# Patient Record
Sex: Male | Born: 1950
Health system: Southern US, Community
[De-identification: ages and names within clinical notes are randomized; demographics above are authoritative.]

## PROBLEM LIST (undated history)

## (undated) DIAGNOSIS — E119 Type 2 diabetes mellitus without complications: Secondary | ICD-10-CM

## (undated) DIAGNOSIS — G4700 Insomnia, unspecified: Secondary | ICD-10-CM

## (undated) DIAGNOSIS — N189 Chronic kidney disease, unspecified: Secondary | ICD-10-CM

## (undated) DIAGNOSIS — C61 Malignant neoplasm of prostate: Secondary | ICD-10-CM

## (undated) DIAGNOSIS — I10 Essential (primary) hypertension: Secondary | ICD-10-CM

## (undated) DIAGNOSIS — E785 Hyperlipidemia, unspecified: Secondary | ICD-10-CM

## (undated) HISTORY — DX: Essential (primary) hypertension: I10

## (undated) HISTORY — PX: PROSTATE BIOPSY: SHX241

## (undated) HISTORY — DX: Insomnia, unspecified: G47.00

## (undated) HISTORY — DX: Chronic kidney disease, unspecified: N18.9

## (undated) HISTORY — DX: Type 2 diabetes mellitus without complications: E11.9

## (undated) HISTORY — DX: Hyperlipidemia, unspecified: E78.5

---

## 1998-05-02 ENCOUNTER — Emergency Department (HOSPITAL_COMMUNITY): Admission: EM | Admit: 1998-05-02 | Discharge: 1998-05-02 | Payer: Self-pay | Admitting: Emergency Medicine

## 2003-07-27 ENCOUNTER — Emergency Department (HOSPITAL_COMMUNITY): Admission: EM | Admit: 2003-07-27 | Discharge: 2003-07-27 | Payer: Self-pay | Admitting: Emergency Medicine

## 2003-07-27 ENCOUNTER — Emergency Department (HOSPITAL_COMMUNITY): Admission: EM | Admit: 2003-07-27 | Discharge: 2003-07-27 | Payer: Self-pay | Admitting: *Deleted

## 2003-07-29 ENCOUNTER — Encounter: Payer: Self-pay | Admitting: Emergency Medicine

## 2003-07-30 ENCOUNTER — Inpatient Hospital Stay (HOSPITAL_COMMUNITY): Admission: EM | Admit: 2003-07-30 | Discharge: 2003-08-02 | Payer: Self-pay | Admitting: Emergency Medicine

## 2003-08-01 ENCOUNTER — Encounter: Payer: Self-pay | Admitting: Orthopedic Surgery

## 2012-11-01 ENCOUNTER — Encounter (HOSPITAL_COMMUNITY): Payer: Self-pay

## 2012-11-01 ENCOUNTER — Emergency Department (INDEPENDENT_AMBULATORY_CARE_PROVIDER_SITE_OTHER)
Admission: EM | Admit: 2012-11-01 | Discharge: 2012-11-01 | Disposition: A | Discharge status: Home / Self Care | Payer: Self-pay | Source: Home / Self Care | Attending: Family Medicine | Admitting: Family Medicine

## 2012-11-01 DIAGNOSIS — M549 Dorsalgia, unspecified: Secondary | ICD-10-CM

## 2012-11-01 MED ORDER — TRAMADOL HCL 50 MG PO TABS: 50.0000 mg | ORAL_TABLET | Freq: Four times a day (QID) | ORAL | Status: DC | PRN

## 2012-11-01 MED ORDER — HYDROCODONE-ACETAMINOPHEN 5-325 MG PO TABS
ORAL_TABLET | ORAL | Status: AC
  Filled 2012-11-01: qty 1

## 2012-11-01 MED ORDER — KETOROLAC TROMETHAMINE 30 MG/ML IJ SOLN
INTRAMUSCULAR | Status: AC
  Filled 2012-11-01: qty 1

## 2012-11-01 MED ORDER — NAPROXEN 500 MG PO TABS: 500.0000 mg | ORAL_TABLET | Freq: Two times a day (BID) | ORAL | Status: DC

## 2012-11-01 MED ORDER — CYCLOBENZAPRINE HCL 10 MG PO TABS: 10.0000 mg | ORAL_TABLET | Freq: Two times a day (BID) | ORAL | Status: DC | PRN

## 2012-11-01 MED ORDER — HYDROCODONE-ACETAMINOPHEN 5-325 MG PO TABS
1.0000 | ORAL_TABLET | Freq: Once | ORAL | Status: AC
  Administered 2012-11-01: 1 via ORAL

## 2012-11-01 MED ORDER — METHYLPREDNISOLONE 4 MG PO KIT: PACK | ORAL | Status: DC

## 2012-11-01 MED ORDER — KETOROLAC TROMETHAMINE 60 MG/2ML IM SOLN
60.0000 mg | Freq: Once | INTRAMUSCULAR | Status: AC
  Administered 2012-11-01: 60 mg via INTRAMUSCULAR

## 2012-11-01 NOTE — ED Notes (Signed)
Pain i right lower back for 1 week; no relief w OTC medications

## 2012-11-01 NOTE — ED Provider Notes (Signed)
History     CSN: [id number]  Arrival date & time 11/01/12  1845   First MD Initiated Contact with Patient 11/01/12 2042      Chief Complaint  Patient presents with  . Back Pain    (Consider location/radiation/quality/duration/timing/severity/associated sxs/prior treatment) Patient is a 62 y.o. male presenting with back pain. The history is provided by the patient.  Back Pain  This is a new problem. The current episode started more than 2 days ago. The problem has been gradually worsening. The pain is associated with no known injury. The pain is present in the lumbar spine (right  side). The pain does not radiate. The pain is moderate. The symptoms are aggravated by bending, twisting and certain positions. The pain is the same all the time. Pertinent negatives include no numbness, no bowel incontinence, no perianal numbness, no bladder incontinence, no dysuria, no paresthesias and no tingling. Treatments tried: hydrocodone. The treatment provided mild relief.  pt reports he works in [employer] daily.  History reviewed. No pertinent past medical history.  History reviewed. No pertinent past surgical history.  History reviewed. No pertinent family history.  History  Substance Use Topics  . Smoking status: Not on file  . Smokeless tobacco: Not on file  . Alcohol Use: Not on file      Review of Systems  Gastrointestinal: Negative for bowel incontinence.  Genitourinary: Negative for bladder incontinence and dysuria.  Musculoskeletal: Positive for back pain.  Neurological: Negative for tingling, numbness and paresthesias.  All other systems reviewed and are negative.    Allergies  Review of patient's allergies indicates not on file.  Home Medications   Current Outpatient Rx  Name  Route  Sig  Dispense  Refill  . CYCLOBENZAPRINE HCL 10 MG PO TABS   Oral   Take 1 tablet (10 mg total) by mouth 2 (two) times daily as needed for muscle spasms.   20  tablet   0   . METHYLPREDNISOLONE 4 MG PO KIT      follow package directions   21 tablet   0   . NAPROXEN 500 MG PO TABS   Oral   Take 1 tablet (500 mg total) by mouth 2 (two) times daily.   30 tablet   0   . TRAMADOL HCL 50 MG PO TABS   Oral   Take 1 tablet (50 mg total) by mouth every 6 (six) hours as needed for pain.   15 tablet   0     BP 154/86  Pulse 94  Temp 98.2 F (36.8 C) (Oral)  Resp 18  SpO2 99%  Physical Exam  Nursing note and vitals reviewed. Constitutional: He is oriented to person, place, and time. Vital signs are normal. He appears well-developed and well-nourished. He is active and cooperative.  HENT:  Head: Normocephalic.  Eyes: Conjunctivae normal are normal. Pupils are equal, round, and reactive to light. No scleral icterus.  Neck: Trachea normal. Neck supple.  Cardiovascular: Normal rate and regular rhythm.   Pulmonary/Chest: Effort normal and breath sounds normal.  Musculoskeletal: Normal range of motion.       Cervical back: Normal.       Thoracic back: Normal.       Lumbar back: He exhibits tenderness. He exhibits normal range of motion, no bony tenderness, no swelling, no edema, no deformity, no laceration, no pain and no spasm.       Back:       Right lower  back tenderness with palpation  Neurological: He is alert and oriented to person, place, and time. He has normal strength and normal reflexes. No cranial nerve deficit or sensory deficit. Coordination and gait normal. GCS eye subscore is 4. GCS verbal subscore is 5. GCS motor subscore is 6.  Skin: Skin is warm and dry.  Psychiatric: He has a normal mood and affect. His speech is normal and behavior is normal. Judgment and thought content normal. Cognition and memory are normal.    ED Course  Procedures (including critical care time)  Labs Reviewed - No data to display No results found.   1. Back pain       MDM  Toradol 60mg  IM and Vicodin administered in office.  Reports  mild improvement at discharge, heat therapy suggested, medications as prescribed, follow up with PCP if symptoms are not improved.          Johnsie Kindred, NP 11/02/12 1619

## 2012-11-02 NOTE — ED Provider Notes (Signed)
Medical screening examination/treatment/procedure(s) were performed by resident physician or non-physician practitioner and as supervising physician I was immediately available for consultation/collaboration.   Arnez Stoneking DOUGLAS MD.    Keland Peyton D Yovan Leeman, MD 11/02/12 1755 

## 2013-07-07 ENCOUNTER — Emergency Department (HOSPITAL_COMMUNITY)
Admission: EM | Admit: 2013-07-07 | Discharge: 2013-07-07 | Discharge status: Against Medical Advice | Payer: Self-pay | Attending: Emergency Medicine | Admitting: Emergency Medicine

## 2013-07-07 ENCOUNTER — Encounter (HOSPITAL_COMMUNITY): Payer: Self-pay | Admitting: Emergency Medicine

## 2013-07-07 DIAGNOSIS — W278XXA Contact with other nonpowered hand tool, initial encounter: Secondary | ICD-10-CM | POA: Insufficient documentation

## 2013-07-07 DIAGNOSIS — S61509A Unspecified open wound of unspecified wrist, initial encounter: Secondary | ICD-10-CM | POA: Insufficient documentation

## 2013-07-07 DIAGNOSIS — Y929 Unspecified place or not applicable: Secondary | ICD-10-CM | POA: Insufficient documentation

## 2013-07-07 DIAGNOSIS — Y9389 Activity, other specified: Secondary | ICD-10-CM | POA: Insufficient documentation

## 2013-07-07 NOTE — ED Notes (Signed)
Pt to window stating he is not going to wait any longer; has removed bandage and no active bleeding noted; informed pt on need to wait to see doctor; pt states he is leaving

## 2013-07-07 NOTE — ED Notes (Signed)
Pt reports he accidentally cut his wrist with a box cutter, bleeding controlled with dressing in triage.

## 2015-06-24 ENCOUNTER — Ambulatory Visit (INDEPENDENT_AMBULATORY_CARE_PROVIDER_SITE_OTHER): Payer: Self-pay | Admitting: Emergency Medicine

## 2015-06-24 VITALS — BP 138/72 | HR 90 | Temp 97.7°F | Resp 18 | Ht 67.0 in | Wt 142.0 lb

## 2015-06-24 DIAGNOSIS — D234 Other benign neoplasm of skin of scalp and neck: Secondary | ICD-10-CM

## 2015-06-24 MED ORDER — DOXYCYCLINE HYCLATE 100 MG PO TABS: 100.0000 mg | ORAL_TABLET | Freq: Two times a day (BID) | ORAL | Status: DC

## 2015-06-24 NOTE — Progress Notes (Signed)
    This chart was scribed for Arlyss Queen, MD by Leandra Kern, Medical Scribe. This patient was seen in Room 11 and the patient's care was started at 8:23 AM.   Chief Complaint:  Chief Complaint  Patient presents with  . Insect Bite    states bee stings scalp 1 week ago    HPI: [name] is a 64 y.o. male who reports to [facility] today complaining of a bee sting on his scalp, onset one week ago.  Pt notes that he did not feel a sting, however when he looked up and saw a nest over his head. Pt indicates that his wife later noticed a knot on the back of his head.    No past medical history on file. No past surgical history on file. Social History   Social History  . Marital Status: Single    Spouse Name: N/A  . Number of Children: N/A  . Years of Education: N/A   Social History Main Topics  . Smoking status: Never Smoker   . Smokeless tobacco: Never Used  . Alcohol Use: No  . Drug Use: No  . Sexual Activity: Not Asked   Other Topics Concern  . None   Social History Narrative   No family history on file. No Known Allergies Prior to Admission medications   Not on File     ROS: The patient denies fevers, chills, night sweats, unintentional weight loss, chest pain, palpitations, wheezing, dyspnea on exertion, nausea, vomiting, abdominal pain, dysuria, hematuria, melena, numbness, weakness, or tingling.   All other systems have been reviewed and were otherwise negative with the exception of those mentioned in the HPI and as above.    PHYSICAL EXAM: Filed Vitals:   06/24/15 0820  BP: 138/72  Pulse: 90  Temp: 97.7 F (36.5 C)  Resp: 18   Body mass index is 22.24 kg/(m^2).   General: Alert, no acute distress HEENT:  Normocephalic, atraumatic, oropharynx patent. Eye: [facility] Cardiovascular:  Regular rate and rhythm, no rubs murmurs or gallops.  No Carotid bruits, radial pulse intact. No pedal edema.  Respiratory: Clear to auscultation bilaterally.  No  wheezes, rales, or rhonchi.  No cyanosis, no use of accessory musculature Abdominal: No organomegaly, abdomen is soft and non-tender, positive bowel sounds.  No masses. Musculoskeletal: Gait intact. No edema, tenderness Skin: No rashes. 1 X 1.5 cm circular cystic nodule mid portion of the scalp. There are some purulent on the center.  Neurologic: Facial musculature symmetric. Psychiatric: Patient acts appropriately throughout our interaction. Lymphatic: No cervical or submandibular lymphadenopathy Genitourinary/Anorectal: No acute findings    LABS: No results found for this or any previous visit.   EKG/XRAY:   Primary read interpreted by Dr. Everlene Farrier at [facility].   ASSESSMENT/PLAN: Patient has what appears to be an infected dermoid cyst of his scalp. Will treat with doxycycline twice a day soap and water cleaning recheck 1 week if cyst persists.I personally performed the services described in this documentation, which was scribed in my presence. The recorded information has been reviewed and is accurate.    Gross sideeffects, risk and benefits, and alternatives of medications d/w patient. Patient is aware that all medications have potential sideeffects and we are unable to predict every sideeffect or drug-drug interaction that may occur.  Arlyss Queen MD 06/24/2015 8:23 AM

## 2015-06-24 NOTE — Patient Instructions (Signed)
Please clean the area with soap and water twice a day. Do gentle massage to the area twice a day. Recheck one week if cyst has not resolved

## 2015-06-26 LAB — WOUND CULTURE
Gram Stain: NONE SEEN
Gram Stain: NONE SEEN

## 2016-01-22 ENCOUNTER — Ambulatory Visit (INDEPENDENT_AMBULATORY_CARE_PROVIDER_SITE_OTHER): Payer: Self-pay

## 2016-01-22 ENCOUNTER — Ambulatory Visit (INDEPENDENT_AMBULATORY_CARE_PROVIDER_SITE_OTHER): Payer: Self-pay | Admitting: Emergency Medicine

## 2016-01-22 VITALS — BP 116/60 | HR 97 | Temp 97.6°F | Resp 16 | Ht 67.0 in | Wt 133.0 lb

## 2016-01-22 DIAGNOSIS — F32A Depression, unspecified: Secondary | ICD-10-CM

## 2016-01-22 DIAGNOSIS — Z1211 Encounter for screening for malignant neoplasm of colon: Secondary | ICD-10-CM

## 2016-01-22 DIAGNOSIS — R634 Abnormal weight loss: Secondary | ICD-10-CM

## 2016-01-22 DIAGNOSIS — Z0000 Encounter for general adult medical examination without abnormal findings: Secondary | ICD-10-CM

## 2016-01-22 DIAGNOSIS — R739 Hyperglycemia, unspecified: Secondary | ICD-10-CM

## 2016-01-22 DIAGNOSIS — G4700 Insomnia, unspecified: Secondary | ICD-10-CM

## 2016-01-22 DIAGNOSIS — Z113 Encounter for screening for infections with a predominantly sexual mode of transmission: Secondary | ICD-10-CM

## 2016-01-22 DIAGNOSIS — D234 Other benign neoplasm of skin of scalp and neck: Secondary | ICD-10-CM

## 2016-01-22 DIAGNOSIS — F329 Major depressive disorder, single episode, unspecified: Secondary | ICD-10-CM

## 2016-01-22 DIAGNOSIS — R369 Urethral discharge, unspecified: Secondary | ICD-10-CM

## 2016-01-22 LAB — POCT CBC
Granulocyte percent: 59 %G (ref 37–80)
HCT, POC: 42.8 % — AB (ref 43.5–53.7)
Hemoglobin: 15.3 g/dL (ref 14.1–18.1)
Lymph, poc: 1.1 (ref 0.6–3.4)
MCH, POC: 32.8 pg — AB (ref 27–31.2)
MCHC: 35.7 g/dL — AB (ref 31.8–35.4)
MCV: 91.9 fL (ref 80–97)
MID (cbc): 0.4 (ref 0–0.9)
MPV: 7.4 fL (ref 0–99.8)
POC Granulocyte: 2.2 (ref 2–6.9)
POC LYMPH PERCENT: 30.6 %L (ref 10–50)
POC MID %: 10.4 %M (ref 0–12)
Platelet Count, POC: 283 10*3/uL (ref 142–424)
RBC: 4.66 M/uL — AB (ref 4.69–6.13)
RDW, POC: 12.1 %
WBC: 3.7 10*3/uL — AB (ref 4.6–10.2)

## 2016-01-22 MED ORDER — ESCITALOPRAM OXALATE 5 MG PO TABS: ORAL_TABLET | ORAL | Status: DC

## 2016-01-22 MED ORDER — DOXYCYCLINE HYCLATE 100 MG PO TABS: 100.0000 mg | ORAL_TABLET | Freq: Two times a day (BID) | ORAL | Status: DC

## 2016-01-22 MED ORDER — CEFTRIAXONE SODIUM 250 MG IJ SOLR
250.0000 mg | Freq: Once | INTRAMUSCULAR | Status: AC
  Administered 2016-01-22: 250 mg via INTRAMUSCULAR

## 2016-01-22 NOTE — Progress Notes (Addendum)
Patient ID: [name], male   DOB: 07-06-1951, 65 y.o.   MRN: [id number]    By signing my name below, I, Essence Howell, attest that this documentation has been prepared under the direction and in the presence of Darlyne Russian, MD Electronically Signed: Ladene Artist, ED Scribe 01/22/2016 at 12:04 PM .  Chief Complaint:  Chief Complaint  Patient presents with  . Annual Exam   HPI: [name] is a 65 y.o. male who reports to [facility] today for an annual exam. Pt denies previous surgeries. No h/o alcohol, tobacco or illicit drug use. Pt denies h/o DM or CA. His brother has a h/o DM and his mother has a h/o colon CA; passed 2 months ago under hospice. Pt states that he has not eaten or slept much since she passed. He reports mild depressed mood from this event but does not desire to speak with a hospice counselor at this time. Pt has tried Aleve PM to assist with sleep but states that it was too strong and he woke up "swimmy headed." He further reports that he has lost weight recently and often wakes up in the mornings with dizziness. No other treatments tried PTA.  Penile Discharge  Pt reports persistent penile discharge for the past 6 months. No treatments tried PTA.   Pt works in home improvement.    Wt Readings from Last 3 Encounters:  01/22/16 133 lb (60.328 kg)  06/24/15 142 lb (64.411 kg)   History reviewed. No pertinent past medical history. History reviewed. No pertinent past surgical history. Social History   Social History  . Marital Status: Single    Spouse Name: N/A  . Number of Children: N/A  . Years of Education: N/A   Social History Main Topics  . Smoking status: Never Smoker   . Smokeless tobacco: Never Used  . Alcohol Use: No  . Drug Use: No  . Sexual Activity: Not Asked   Other Topics Concern  . None   Social History Narrative   History reviewed. No pertinent family history. No Known Allergies Prior to Admission medications   Medication Sig Start  Date End Date Taking? Authorizing Provider  doxycycline (VIBRA-TABS) 100 MG tablet Take 1 tablet (100 mg total) by mouth 2 (two) times daily. Patient not taking: Reported on 01/22/2016 06/24/15   Darlyne Russian, MD   ROS: The patient denies fevers, chills, night sweats, chest pain, palpitations, wheezing, dyspnea on exertion, nausea, vomiting, abdominal pain, dysuria, hematuria, melena, numbness, weakness, or tingling. +weight change, +penile discharge, +dizziness, +sleep disturbances   All other systems have been reviewed and were otherwise negative with the exception of those mentioned in the HPI and as above.    PHYSICAL EXAM: Filed Vitals:   01/22/16 1039  BP: 116/60  Pulse: 97  Temp: 97.6 F (36.4 C)  Resp: 16   Body mass index is 20.83 kg/(m^2).  General: Alert, no acute distress HEENT:  Normocephalic, atraumatic, oropharynx patent. Poor dentition.  Eye: [facility] Cardiovascular: Regular rate and rhythm, no rubs murmurs or gallops. No Carotid bruits, radial pulse intact. No pedal edema.  Respiratory: Clear to auscultation bilaterally. No wheezes, rales, or rhonchi. No cyanosis, no use of accessory musculature Abdominal: No organomegaly, abdomen is soft and non-tender, positive bowel sounds. No masses. Musculoskeletal: Gait intact. No edema, tenderness GU: Uncircumcised. Yellow thick purulent discharge. Normal prostate. Normal rectal exam.  Skin: No rashes. Neurologic: Facial musculature symmetric. Psychiatric: Patient acts appropriately throughout our interaction. Lymphatic: No cervical or  submandibular lymphadenopathy  LABS: Results for orders placed or performed in visit on 01/22/16  POCT CBC  Result Value Ref Range   WBC 3.7 (A) 4.6 - 10.2 K/uL   Lymph, poc 1.1 0.6 - 3.4   POC LYMPH PERCENT 30.6 10 - 50 %L   MID (cbc) 0.4 0 - 0.9   POC MID % 10.4 0 - 12 %M   POC Granulocyte 2.2 2 - 6.9   Granulocyte percent 59.0 37 - 80 %G   RBC 4.66 (A) 4.69 - 6.13 M/uL    Hemoglobin 15.3 14.1 - 18.1 g/dL   HCT, POC 42.8 (A) 43.5 - 53.7 %   MCV 91.9 80 - 97 fL   MCH, POC 32.8 (A) 27 - 31.2 pg   MCHC 35.7 (A) 31.8 - 35.4 g/dL   RDW, POC 12.1 %   Platelet Count, POC 283 142 - 424 K/uL   MPV 7.4 0 - 99.8 fL   EKG/XRAY:   Primary read interpreted by Dr. Everlene Farrier at [facility]. EKG was normal sinus rhythm Dg Chest 2 View  01/22/2016  CLINICAL DATA:  Weight loss EXAM: CHEST  2 VIEW COMPARISON:  None in PACs FINDINGS: The lungs are well-expanded and clear. There is a 2 mm diameter calcified nodule that projects approximately 1.5 cm above the dome of the left hemidiaphragm on the frontal view. The heart and pulmonary vascularity are normal. The mediastinum is normal in width. There is no pleural effusion or pneumothorax. The bony thorax exhibits no acute abnormality. IMPRESSION: There is no active cardiopulmonary disease. Electronically Signed   By: David  Martinique M.D.   On: 01/22/2016 12:42      ASSESSMENT/PLAN: Patient seemed depressed to me. He lost his mother 2 months ago to cancer. She died of colon cancer and he has never had colon cancer screening. We'll try Lexapro 5 mg at night to see if this helps with depression and with his problems with insomnia. His routine labs were done today including STD testing. On exam he had a. Urethral discharge which was sent for gonorrhea and chlamydia. Glucose returned almost 400. Have started Glucophage 500 one a day for the first week then 2 daily. Recheck 1 week.    Gross sideeffects, risk and benefits, and alternatives of medications d/w patient. Patient is aware that all medications have potential sideeffects and we are unable to predict every sideeffect or drug-drug interaction that may occur.  Arlyss Queen MD 01/22/2016 10:53 AM

## 2016-01-22 NOTE — Patient Instructions (Addendum)
   IF you received an x-ray today, you will receive an invoice from [facility]. Please contact [facility] at [phone] with questions or concerns regarding your invoice.   IF you received labwork today, you will receive an invoice from [facility]. Please contact [facility] at [phone] with questions or concerns regarding your invoice.   Our billing staff will not be able to assist you with questions regarding bills from these companies.  You will be contacted with the lab results as soon as they are available. The fastest way to get your results is to activate your My Chart account. Instructions are located on the last page of this paperwork. If you have not heard from us regarding the results in 2 weeks, please contact this office.     Health Maintenance, Male A healthy lifestyle and preventative care can promote health and wellness.  Maintain regular health, dental, and eye exams.  Eat a healthy diet. Foods like vegetables, fruits, whole grains, low-fat dairy products, and lean protein foods contain the nutrients you need and are low in calories. Decrease your intake of foods high in solid fats, added sugars, and salt. Get information about a proper diet from your health care provider, if necessary.  Regular physical exercise is one of the most important things you can do for your health. Most adults should get at least 150 minutes of moderate-intensity exercise (any activity that increases your heart rate and causes you to sweat) each week. In addition, most adults need muscle-strengthening exercises on 2 or more days a week.   Maintain a healthy weight. The body mass index (BMI) is a screening tool to identify possible weight problems. It provides an estimate of body fat based on height and weight. Your health care provider can find your BMI and can help you achieve or maintain a healthy weight. For males 20 years and older:  A BMI  below 18.5 is considered underweight.  A BMI of 18.5 to 24.9 is normal.  A BMI of 25 to 29.9 is considered overweight.  A BMI of 30 and above is considered obese.  Maintain normal blood lipids and cholesterol by exercising and minimizing your intake of saturated fat. Eat a balanced diet with plenty of fruits and vegetables. Blood tests for lipids and cholesterol should begin at age 20 and be repeated every 5 years. If your lipid or cholesterol levels are high, you are over age 50, or you are at high risk for heart disease, you may need your cholesterol levels checked more frequently.Ongoing high lipid and cholesterol levels should be treated with medicines if diet and exercise are not working.  If you smoke, find out from your health care provider how to quit. If you do not use tobacco, do not start.  Lung cancer screening is recommended for adults aged 55-80 years who are at high risk for developing lung cancer because of a history of smoking. A yearly low-dose CT scan of the lungs is recommended for people who have at least a 30-pack-year history of smoking and are current smokers or have quit within the past 15 years. A pack year of smoking is smoking an average of 1 pack of cigarettes a day for 1 year (for example, a 30-pack-year history of smoking could mean smoking 1 pack a day for 30 years or 2 packs a day for 15 years). Yearly screening should continue until the smoker has stopped smoking for at least 15 years. Yearly screening should be   stopped for people who develop a health problem that would prevent them from having lung cancer treatment.  If you choose to drink alcohol, do not have more than 2 drinks per day. One drink is considered to be 12 oz (360 mL) of beer, 5 oz (150 mL) of wine, or 1.5 oz (45 mL) of liquor.  Avoid the use of street drugs. Do not share needles with anyone. Ask for help if you need support or instructions about stopping the use of drugs.  High blood pressure  causes heart disease and increases the risk of stroke. High blood pressure is more likely to develop in:  People who have blood pressure in the end of the normal range (100-139/85-89 mm Hg).  People who are overweight or obese.  People who are African American.  If you are 59-85 years of age, have your blood pressure checked every 3-5 years. If you are 77 years of age or older, have your blood pressure checked every year. You should have your blood pressure measured twice--once when you are at a hospital or clinic, and once when you are not at a hospital or clinic. Record the average of the two measurements. To check your blood pressure when you are not at a hospital or clinic, you can use:  An automated blood pressure machine at a pharmacy.  A home blood pressure monitor.  If you are 55-40 years old, ask your health care provider if you should take aspirin to prevent heart disease.  Diabetes screening involves taking a blood sample to check your fasting blood sugar level. This should be done once every 3 years after age 82 if you are at a normal weight and without risk factors for diabetes. Testing should be considered at a younger age or be carried out more frequently if you are overweight and have at least 1 risk factor for diabetes.  Colorectal cancer can be detected and often prevented. Most routine colorectal cancer screening begins at the age of 60 and continues through age 35. However, your health care provider may recommend screening at an earlier age if you have risk factors for colon cancer. On a yearly basis, your health care provider may provide home test kits to check for hidden blood in the stool. A small camera at the end of a tube may be used to directly examine the colon (sigmoidoscopy or colonoscopy) to detect the earliest forms of colorectal cancer. Talk to your health care provider about this at age 48 when routine screening begins. A direct exam of the colon should be repeated  every 5-10 years through age 71, unless early forms of precancerous polyps or small growths are found.  People who are at an increased risk for hepatitis B should be screened for this virus. You are considered at high risk for hepatitis B if:  You were born in a country where hepatitis B occurs often. Talk with your health care provider about which countries are considered high risk.  Your parents were born in a high-risk country and you have not received a shot to protect against hepatitis B (hepatitis B vaccine).  You have HIV or AIDS.  You use needles to inject street drugs.  You live with, or have sex with, someone who has hepatitis B.  You are a man who has sex with other men (MSM).  You get hemodialysis treatment.  You take certain medicines for conditions like cancer, organ transplantation, and autoimmune conditions.  Hepatitis C blood testing is recommended  for all people born from [birth year] through 1964 and any individual with known risk factors for hepatitis C.  Healthy men should no longer receive prostate-specific antigen (PSA) blood tests as part of routine cancer screening. Talk to your health care provider about prostate cancer screening.  Testicular cancer screening is not recommended for adolescents or adult males who have no symptoms. Screening includes self-exam, a health care provider exam, and other screening tests. Consult with your health care provider about any symptoms you have or any concerns you have about testicular cancer.  Practice safe sex. Use condoms and avoid high-risk sexual practices to reduce the spread of sexually transmitted infections (STIs).  You should be screened for STIs, including gonorrhea and chlamydia if:  You are sexually active and are younger than 24 years.  You are older than 24 years, and your health care provider tells you that you are at risk for this type of infection.  Your sexual activity has changed since you were last screened,  and you are at an increased risk for chlamydia or gonorrhea. Ask your health care provider if you are at risk.  If you are at risk of being infected with HIV, it is recommended that you take a prescription medicine daily to prevent HIV infection. This is called pre-exposure prophylaxis (PrEP). You are considered at risk if:  You are a man who has sex with other men (MSM).  You are a heterosexual man who is sexually active with multiple partners.  You take drugs by injection.  You are sexually active with a partner who has HIV.  Talk with your health care provider about whether you are at high risk of being infected with HIV. If you choose to begin PrEP, you should first be tested for HIV. You should then be tested every 3 months for as long as you are taking PrEP.  Use sunscreen. Apply sunscreen liberally and repeatedly throughout the day. You should seek shade when your shadow is shorter than you. Protect yourself by wearing long sleeves, pants, a wide-brimmed hat, and sunglasses year round whenever you are outdoors.  Tell your health care provider of new moles or changes in moles, especially if there is a change in shape or color. Also, tell your health care provider if a mole is larger than the size of a pencil eraser.  A one-time screening for abdominal aortic aneurysm (AAA) and surgical repair of large AAAs by ultrasound is recommended for men aged 49-75 years who are current or former smokers.  Stay current with your vaccines (immunizations).   This information is not intended to replace advice given to you by your health care provider. Make sure you discuss any questions you have with your health care provider.   Document Released: 04/10/2008 Document Revised: 11/03/2014 Document Reviewed: 03/10/2011 Elsevier Interactive Patient Education 2016 [employer]. Insomnia Insomnia is a sleep disorder that makes it difficult to fall asleep or to stay asleep. Insomnia can cause tiredness  (fatigue), low energy, difficulty concentrating, mood swings, and poor performance at work or school.  There are three different ways to classify insomnia:  Difficulty falling asleep.  Difficulty staying asleep.  Waking up too early in the morning. Any type of insomnia can be long-term (chronic) or short-term (acute). Both are common. Short-term insomnia usually lasts for three months or less. Chronic insomnia occurs at least three times a week for longer than three months. CAUSES  Insomnia may be caused by another condition, situation, or substance, such as:  Anxiety.  Certain medicines.  Gastroesophageal reflux disease (GERD) or other gastrointestinal conditions.  Asthma or other breathing conditions.  Restless legs syndrome, sleep apnea, or other sleep disorders.  Chronic pain.  Menopause. This may include hot flashes.  Stroke.  Abuse of alcohol, tobacco, or illegal drugs.  Depression.  Caffeine.   Neurological disorders, such as Alzheimer disease.  An overactive thyroid (hyperthyroidism). The cause of insomnia may not be known. RISK FACTORS Risk factors for insomnia include:  Gender. Women are more commonly affected than men.  Age. Insomnia is more common as you get older.  Stress. This may involve your professional or personal life.  Income. Insomnia is more common in people with lower income.  Lack of exercise.   Irregular work schedule or night shifts.  Traveling between different time zones. SIGNS AND SYMPTOMS If you have insomnia, trouble falling asleep or trouble staying asleep is the main symptom. This may lead to other symptoms, such as:  Feeling fatigued.  Feeling nervous about going to sleep.  Not feeling rested in the morning.  Having trouble concentrating.  Feeling irritable, anxious, or depressed. TREATMENT  Treatment for insomnia depends on the cause. If your insomnia is caused by an underlying condition, treatment will focus on  addressing the condition. Treatment may also include:   Medicines to help you sleep.  Counseling or therapy.  Lifestyle adjustments. HOME CARE INSTRUCTIONS   Take medicines only as directed by your health care provider.  Keep regular sleeping and waking hours. Avoid naps.  Keep a sleep diary to help you and your health care provider figure out what could be causing your insomnia. Include:   When you sleep.  When you wake up during the night.  How well you sleep.   How rested you feel the next day.  Any side effects of medicines you are taking.  What you eat and drink.   Make your bedroom a comfortable place where it is easy to fall asleep:  Put up shades or special blackout curtains to block light from outside.  Use a white noise machine to block noise.  Keep the temperature cool.   Exercise regularly as directed by your health care provider. Avoid exercising right before bedtime.  Use relaxation techniques to manage stress. Ask your health care provider to suggest some techniques that may work well for you. These may include:  Breathing exercises.  Routines to release muscle tension.  Visualizing peaceful scenes.  Cut back on alcohol, caffeinated beverages, and cigarettes, especially close to bedtime. These can disrupt your sleep.  Do not overeat or eat spicy foods right before bedtime. This can lead to digestive discomfort that can make it hard for you to sleep.  Limit screen use before bedtime. This includes:  Watching TV.  Using your smartphone, tablet, and computer.  Stick to a routine. This can help you fall asleep faster. Try to do a quiet activity, brush your teeth, and go to bed at the same time each night.  Get out of bed if you are still awake after 15 minutes of trying to sleep. Keep the lights down, but try reading or doing a quiet activity. When you feel sleepy, go back to bed.  Make sure that you drive carefully. Avoid driving if you feel  very sleepy.  Keep all follow-up appointments as directed by your health care provider. This is important. SEEK MEDICAL CARE IF:   You are tired throughout the day or have trouble in your daily routine due  to sleepiness.  You continue to have sleep problems or your sleep problems get worse. SEEK IMMEDIATE MEDICAL CARE IF:   You have serious thoughts about hurting yourself or someone else.   This information is not intended to replace advice given to you by your health care provider. Make sure you discuss any questions you have with your health care provider.   Document Released: 10/10/2000 Document Revised: 07/04/2015 Document Reviewed: 07/14/2014 Elsevier Interactive Patient Education [employer].

## 2016-01-23 ENCOUNTER — Other Ambulatory Visit: Payer: Self-pay | Admitting: Emergency Medicine

## 2016-01-23 DIAGNOSIS — E119 Type 2 diabetes mellitus without complications: Secondary | ICD-10-CM | POA: Insufficient documentation

## 2016-01-23 LAB — COMPLETE METABOLIC PANEL WITH GFR
ALT: 13 U/L (ref 9–46)
AST: 14 U/L (ref 10–35)
Albumin: 4.2 g/dL (ref 3.6–5.1)
Alkaline Phosphatase: 93 U/L (ref 40–115)
BUN: 17 mg/dL (ref 7–25)
CO2: 29 mmol/L (ref 20–31)
Calcium: 10.1 mg/dL (ref 8.6–10.3)
Chloride: 94 mmol/L — ABNORMAL LOW (ref 98–110)
Creat: 1.09 mg/dL (ref 0.70–1.25)
GFR, Est African American: 83 mL/min (ref >=60)
GFR, Est Non African American: 72 mL/min (ref >=60)
Glucose, Bld: 382 mg/dL — ABNORMAL HIGH (ref 65–99)
Potassium: 4.9 mmol/L (ref 3.5–5.3)
Sodium: 137 mmol/L (ref 135–146)
Total Bilirubin: 0.6 mg/dL (ref 0.2–1.2)
Total Protein: 7.3 g/dL (ref 6.1–8.1)

## 2016-01-23 LAB — LIPID PANEL
Cholesterol: 221 mg/dL — ABNORMAL HIGH (ref 125–200)
HDL: 50 mg/dL (ref >=40)
LDL Cholesterol: 148 mg/dL — ABNORMAL HIGH (ref <130)
Total CHOL/HDL Ratio: 4.4 Ratio (ref <=5.0)
Triglycerides: 114 mg/dL (ref <150)
VLDL: 23 mg/dL (ref <30)

## 2016-01-23 LAB — PSA: PSA: 2.69 ng/mL (ref <=4.00)

## 2016-01-23 LAB — HIV ANTIBODY (ROUTINE TESTING W REFLEX): HIV 1&2 Ab, 4th Generation: NONREACTIVE

## 2016-01-23 LAB — GC/CHLAMYDIA PROBE AMP
CT Probe RNA: NOT DETECTED
GC Probe RNA: NOT DETECTED

## 2016-01-23 LAB — RPR

## 2016-01-23 LAB — HEPATITIS C ANTIBODY: HCV Ab: NEGATIVE

## 2016-01-23 MED ORDER — METFORMIN HCL ER 500 MG PO TB24: ORAL_TABLET | ORAL | Status: DC

## 2016-01-23 NOTE — Addendum Note (Signed)
Addended by: Arlyss Queen A on: 01/23/2016 07:50 AM   Modules accepted: Miquel Dunn

## 2016-02-22 ENCOUNTER — Other Ambulatory Visit: Payer: Self-pay

## 2016-02-22 DIAGNOSIS — F32A Depression, unspecified: Secondary | ICD-10-CM

## 2016-02-22 DIAGNOSIS — F329 Major depressive disorder, single episode, unspecified: Secondary | ICD-10-CM

## 2016-02-22 DIAGNOSIS — G4700 Insomnia, unspecified: Secondary | ICD-10-CM

## 2016-02-22 MED ORDER — ESCITALOPRAM OXALATE 5 MG PO TABS: ORAL_TABLET | ORAL | Status: DC

## 2016-03-04 ENCOUNTER — Other Ambulatory Visit: Payer: Self-pay

## 2016-03-04 DIAGNOSIS — G4700 Insomnia, unspecified: Secondary | ICD-10-CM

## 2016-03-04 DIAGNOSIS — F329 Major depressive disorder, single episode, unspecified: Secondary | ICD-10-CM

## 2016-03-04 DIAGNOSIS — F32A Depression, unspecified: Secondary | ICD-10-CM

## 2016-03-04 MED ORDER — ESCITALOPRAM OXALATE 5 MG PO TABS: ORAL_TABLET | ORAL | Status: DC

## 2016-03-04 MED ORDER — METFORMIN HCL ER 500 MG PO TB24: ORAL_TABLET | ORAL | Status: DC

## 2018-07-06 ENCOUNTER — Encounter

## 2018-11-01 ENCOUNTER — Encounter (HOSPITAL_COMMUNITY): Payer: Self-pay

## 2018-11-01 ENCOUNTER — Other Ambulatory Visit: Payer: Self-pay

## 2018-11-01 ENCOUNTER — Ambulatory Visit (HOSPITAL_COMMUNITY)
Admission: EM | Admit: 2018-11-01 | Discharge: 2018-11-01 | Disposition: A | Discharge status: Home / Self Care | Payer: Medicare HMO | Attending: Internal Medicine | Admitting: Internal Medicine

## 2018-11-01 ENCOUNTER — Ambulatory Visit (INDEPENDENT_AMBULATORY_CARE_PROVIDER_SITE_OTHER): Payer: Medicare HMO

## 2018-11-01 DIAGNOSIS — J069 Acute upper respiratory infection, unspecified: Secondary | ICD-10-CM | POA: Diagnosis not present

## 2018-11-01 DIAGNOSIS — R05 Cough: Secondary | ICD-10-CM

## 2018-11-01 DIAGNOSIS — J029 Acute pharyngitis, unspecified: Secondary | ICD-10-CM

## 2018-11-01 DIAGNOSIS — B9789 Other viral agents as the cause of diseases classified elsewhere: Secondary | ICD-10-CM | POA: Diagnosis not present

## 2018-11-01 LAB — POCT RAPID STREP A: Streptococcus, Group A Screen (Direct): NEGATIVE

## 2018-11-01 MED ORDER — ACETAMINOPHEN 325 MG PO TABS
975.0000 mg | ORAL_TABLET | Freq: Once | ORAL | Status: AC
  Administered 2018-11-01: 975 mg via ORAL

## 2018-11-01 MED ORDER — IBUPROFEN 600 MG PO TABS: 600.0000 mg | ORAL_TABLET | Freq: Four times a day (QID) | ORAL | 0 refills | Status: DC | PRN

## 2018-11-01 MED ORDER — ACETAMINOPHEN 325 MG PO TABS
ORAL_TABLET | ORAL | Status: AC
  Filled 2018-11-01: qty 3

## 2018-11-01 MED ORDER — GUAIFENESIN-CODEINE 100-10 MG/5ML PO SOLN: 5.0000 mL | Freq: Three times a day (TID) | ORAL | 0 refills | Status: DC | PRN

## 2018-11-01 NOTE — ED Triage Notes (Signed)
Pt cc coughing and sore throat x  3 days.

## 2018-11-01 NOTE — Discharge Instructions (Addendum)
Strep test negative Chest Xray negative for pneumonia Likely viral illness causing symptoms and fever Please use robitussin with codeine to help with cough and sore thorat- will cause drowsiness, do not drive or work after taking  Sore Throat  Your rapid strep tested Negative today. We will send for a culture and call in about 2 days if results are positive. For now we will treat your sore throat as a virus with symptom management.   Please continue Tylenol or Ibuprofen for fever and pain. May try salt water gargles, cepacol lozenges, throat spray, or OTC cold relief medicine for throat discomfort. If you also have congestion take a daily anti-histamine like Zyrtec, Claritin, and a oral decongestant to help with post nasal drip that may be irritating your throat.   Stay hydrated and drink plenty of fluids to keep your throat coated relieve irritation.

## 2018-11-01 NOTE — ED Provider Notes (Addendum)
[facility]    CSN: [id number] Arrival date & time: 11/01/18  2706     History   Chief Complaint Chief Complaint  Patient presents with  . Cough    HPI [name] is a 68 y.o. male history of diabetes, depression presenting today for evaluation of cough, sore throat and fever.  Patient states that for the past 3 days he has had hot and cold chills, felt warm and noted a persistent cough.  The cough is led to a sore throat.  Now his sore throat is his main complaint.  Decreased oral intake due to discomfort with swallowing.  Has had minimal nasal congestion.  He has tried Scientist, clinical (histocompatibility and immunogenetics).  Has not taken any antipyretics.  Denies nausea, vomiting or diarrhea.  Symptoms began after scraping of popcorn ceiling. Denies chest pain or shortness of breath.  HPI  History reviewed. No pertinent past medical history.  Patient Active Problem List   Diagnosis Date Noted  . Diabetes ([place]) 01/23/2016  . Depression (em[place]) 01/22/2016    History reviewed. No pertinent surgical history.     Home Medications    Prior to Admission medications   Medication Sig Start Date End Date Taking? Authorizing Provider  doxycycline (VIBRA-TABS) 100 MG tablet Take 1 tablet (100 mg total) by mouth 2 (two) times daily. 01/22/16   Darlyne Russian, MD  escitalopram (LEXAPRO) 5 MG tablet Take 1 tablet at night for sleep after 2 weeks if still not sleeping well may take 2 tablets. 03/04/16   Darlyne Russian, MD  guaiFENesin-codeine 100-10 MG/5ML syrup Take 5 mLs by mouth 3 (three) times daily as needed for cough. Will cause drowsiness 11/01/18   Fatin Bachicha C, PA-C  ibuprofen (ADVIL,MOTRIN) 600 MG tablet Take 1 tablet (600 mg total) by mouth every 6 (six) hours as needed. 11/01/18   Jerrard Bradburn C, PA-C  metFORMIN (GLUCOPHAGE-XR) 500 MG 24 hr tablet Take 1 tablet every morning for the first week then 2 tablets every morning. 03/04/16   Darlyne Russian, MD    Family History History reviewed.  No pertinent family history.  Social History Social History   Tobacco Use  . Smoking status: Never Smoker  . Smokeless tobacco: Never Used  Substance Use Topics  . Alcohol use: No  . Drug use: No     Allergies   Patient has no known allergies.   Review of Systems Review of Systems  Constitutional: Positive for chills and fever. Negative for activity change, appetite change and fatigue.  HENT: Positive for sore throat. Negative for congestion, ear pain, rhinorrhea, sinus pressure and trouble swallowing.   Eyes: Negative for discharge and redness.  Respiratory: Positive for cough. Negative for chest tightness and shortness of breath.   Cardiovascular: Negative for chest pain.  Gastrointestinal: Negative for abdominal pain, diarrhea, nausea and vomiting.  Musculoskeletal: Negative for myalgias.  Skin: Negative for rash.  Neurological: Negative for dizziness, light-headedness and headaches.     Physical Exam Triage Vital Signs ED Triage Vitals  Enc Vitals Group     BP 11/01/18 0843 127/72     Pulse Rate 11/01/18 0843 100     Resp 11/01/18 0843 18     Temp 11/01/18 0843 (!) 101.8 F (38.8 C)     Temp Source 11/01/18 0843 Oral     SpO2 11/01/18 0843 97 %     Weight 11/01/18 0849 142 lb (64.4 kg)     Height --  Head Circumference --      Peak Flow --      Pain Score 11/01/18 0849 5     Pain Loc --      Pain Edu? --      Excl. in [place]? --    No data found.  Updated Vital Signs BP 127/72 (BP Location: Left Arm)   Pulse 100   Temp (!) 101.8 F (38.8 C) (Oral)   Resp 18   Wt 142 lb (64.4 kg)   SpO2 97%   BMI 22.24 kg/m   Visual Acuity Right Eye Distance:   Left Eye Distance:   Bilateral Distance:    Right Eye Near:   Left Eye Near:    Bilateral Near:     Physical Exam Vitals signs and nursing note reviewed.  Constitutional:      Appearance: He is well-developed.  HENT:     Head: Normocephalic and atraumatic.     Ears:     Comments: Bilateral  ears without tenderness to palpation of external auricle, tragus and mastoid, EAC's without erythema or swelling, TM's with good bony landmarks and cone of light. Non erythematous.    Nose:     Comments: Nasal mucosa pink    Mouth/Throat:     Comments: Oral mucosa pink and moist, no tonsillar enlargement or exudate. Posterior pharynx patent and nonerythematous, no uvula deviation or swelling. Normal phonation. Eyes:     Conjunctiva/sclera: Conjunctivae normal.  Neck:     Musculoskeletal: Neck supple.  Cardiovascular:     Rate and Rhythm: Normal rate and regular rhythm.     Heart sounds: No murmur.  Pulmonary:     Effort: Pulmonary effort is normal. No respiratory distress.     Breath sounds: Normal breath sounds.     Comments: Breathing comfortably at rest, CTABL, no wheezing, rales or other adventitious sounds auscultated Abdominal:     Palpations: Abdomen is soft.     Tenderness: There is no abdominal tenderness.  Skin:    General: Skin is warm and dry.  Neurological:     Mental Status: He is alert.      UC Treatments / Results  Labs (all labs ordered are listed, but only abnormal results are displayed) Labs Reviewed  CULTURE, GROUP A STREP [facility])  POCT RAPID STREP A    EKG None  Radiology Dg Chest 2 View  Result Date: 11/01/2018 CLINICAL DATA:  Fever and cough EXAM: CHEST - 2 VIEW COMPARISON:  01/22/2016 chest radiograph. FINDINGS: Stable cardiomediastinal silhouette with normal heart size. No pneumothorax. No pleural effusion. Lungs appear clear, with no acute consolidative airspace disease and no pulmonary edema. IMPRESSION: No active cardiopulmonary disease. Electronically Signed   By: Ilona Sorrel M.D.   On: 11/01/2018 09:31    Procedures Procedures (including critical care time)  Medications Ordered in UC Medications  acetaminophen (TYLENOL) tablet 975 mg (975 mg Oral Given 11/01/18 0919)    Initial Impression / Assessment and Plan / UC Course  I have  reviewed the triage vital signs and the nursing notes.  Pertinent labs & imaging results that were available during my care of the patient were reviewed by me and considered in my medical decision making (see chart for details).     68 year old male, fever, cough and sore throat.  Strep test negative.  Will check chest x-ray to rule out pneumonia.  Chest x-ray negative.  Most likely viral illness given length of symptoms.  Possible influenza.  Patient outside of Tamiflu  window.  Will recommend symptomatic and supportive care.  Provided Robitussin-AC to help with cough and throat discomfort recommended further symptomatic management of sore throat.  Continue to monitor temperature, breathing, cough, symptoms,Discussed strict return precautions. Patient verbalized understanding and is agreeable with plan.  Final Clinical Impressions(s) / UC Diagnoses   Final diagnoses:  Viral URI with cough     Discharge Instructions     Strep test negative Chest Xray negative for pneumonia Likely viral illness causing symptoms and fever Please use robitussin with codeine to help with cough and sore thorat- will cause drowsiness, do not drive or work after taking  Sore Throat  Your rapid strep tested Negative today. We will send for a culture and call in about 2 days if results are positive. For now we will treat your sore throat as a virus with symptom management.   Please continue Tylenol or Ibuprofen for fever and pain. May try salt water gargles, cepacol lozenges, throat spray, or OTC cold relief medicine for throat discomfort. If you also have congestion take a daily anti-histamine like Zyrtec, Claritin, and a oral decongestant to help with post nasal drip that may be irritating your throat.   Stay hydrated and drink plenty of fluids to keep your throat coated relieve irritation.     ED Prescriptions    Medication Sig Dispense Auth. Provider   guaiFENesin-codeine 100-10 MG/5ML syrup Take 5 mLs by  mouth 3 (three) times daily as needed for cough. Will cause drowsiness 120 mL Swan [place] C, PA-C   ibuprofen (ADVIL,MOTRIN) 600 MG tablet Take 1 tablet (600 mg total) by mouth every 6 (six) hours as needed. 30 tablet Dashel Goines, Beatty C, PA-C     Controlled Substance Prescriptions [place] Controlled Substance Registry consulted?  Yes, benefit greater than risk.   Janith Lima, PA-C 11/01/18 0954    Janith Lima, PA-C 11/01/18 [phone]

## 2018-11-03 LAB — CULTURE, GROUP A STREP (THRC)

## 2018-11-10 ENCOUNTER — Encounter: Payer: Self-pay | Admitting: Family Medicine

## 2018-11-10 ENCOUNTER — Ambulatory Visit (INDEPENDENT_AMBULATORY_CARE_PROVIDER_SITE_OTHER): Payer: Medicare HMO | Admitting: Family Medicine

## 2018-11-10 VITALS — BP 134/82 | HR 82 | Temp 97.6°F | Ht 67.0 in | Wt 138.0 lb

## 2018-11-10 DIAGNOSIS — R69 Illness, unspecified: Secondary | ICD-10-CM | POA: Diagnosis not present

## 2018-11-10 DIAGNOSIS — I10 Essential (primary) hypertension: Secondary | ICD-10-CM

## 2018-11-10 DIAGNOSIS — E119 Type 2 diabetes mellitus without complications: Secondary | ICD-10-CM

## 2018-11-10 DIAGNOSIS — G4700 Insomnia, unspecified: Secondary | ICD-10-CM | POA: Insufficient documentation

## 2018-11-10 DIAGNOSIS — Z1211 Encounter for screening for malignant neoplasm of colon: Secondary | ICD-10-CM | POA: Diagnosis not present

## 2018-11-10 DIAGNOSIS — F5101 Primary insomnia: Secondary | ICD-10-CM

## 2018-11-10 LAB — COMPREHENSIVE METABOLIC PANEL
ALT: 13 U/L (ref 0–53)
AST: 18 U/L (ref 0–37)
Albumin: 3.9 g/dL (ref 3.5–5.2)
Alkaline Phosphatase: 76 U/L (ref 39–117)
BUN: 24 mg/dL — ABNORMAL HIGH (ref 6–23)
CO2: 31 mEq/L (ref 19–32)
Calcium: 9.7 mg/dL (ref 8.4–10.5)
Chloride: 98 mEq/L (ref 96–112)
Creatinine, Ser: 1.2 mg/dL (ref 0.40–1.50)
GFR: 77.8 mL/min (ref >60.00)
Glucose, Bld: 322 mg/dL — ABNORMAL HIGH (ref 70–99)
Potassium: 4 mEq/L (ref 3.5–5.1)
Sodium: 137 mEq/L (ref 135–145)
Total Bilirubin: 0.5 mg/dL (ref 0.2–1.2)
Total Protein: 7.2 g/dL (ref 6.0–8.3)

## 2018-11-10 LAB — CBC
HCT: 36.3 % — ABNORMAL LOW (ref 39.0–52.0)
Hemoglobin: 12.9 g/dL — ABNORMAL LOW (ref 13.0–17.0)
MCHC: 35.6 g/dL (ref 30.0–36.0)
MCV: 86.5 fl (ref 78.0–100.0)
Platelets: 332 10*3/uL (ref 150.0–400.0)
RBC: 4.2 Mil/uL — ABNORMAL LOW (ref 4.22–5.81)
RDW: 12.6 % (ref 11.5–15.5)
WBC: 3.3 10*3/uL — ABNORMAL LOW (ref 4.0–10.5)

## 2018-11-10 LAB — MICROALBUMIN / CREATININE URINE RATIO
Creatinine,U: 191.5 mg/dL
Microalb Creat Ratio: 7.1 mg/g (ref 0.0–30.0)
Microalb, Ur: 13.6 mg/dL — ABNORMAL HIGH (ref 0.0–1.9)

## 2018-11-10 LAB — LIPID PANEL
Cholesterol: 225 mg/dL — ABNORMAL HIGH (ref 0–200)
HDL: 44.5 mg/dL (ref >39.00)
LDL Cholesterol: 156 mg/dL — ABNORMAL HIGH (ref 0–99)
NonHDL: 180.3
Total CHOL/HDL Ratio: 5
Triglycerides: 123 mg/dL (ref 0.0–149.0)
VLDL: 24.6 mg/dL (ref 0.0–40.0)

## 2018-11-10 LAB — TSH: TSH: 0.78 u[IU]/mL (ref 0.35–4.50)

## 2018-11-10 LAB — HEMOGLOBIN A1C: Hgb A1c MFr Bld: 11.8 % — ABNORMAL HIGH (ref 4.6–6.5)

## 2018-11-10 MED ORDER — TRAZODONE HCL 50 MG PO TABS: 50.0000 mg | ORAL_TABLET | Freq: Every evening | ORAL | 3 refills | Status: DC | PRN

## 2018-11-10 NOTE — Assessment & Plan Note (Signed)
-  BP rechecked with improved reading, will continue to monitor for now.

## 2018-11-10 NOTE — Patient Instructions (Signed)
Type 2 Diabetes Mellitus, Diagnosis, Adult Type 2 diabetes (type 2 diabetes mellitus) is a long-term (chronic) disease. It may be caused by one or both of these problems:  Your pancreas does not make enough of a hormone called insulin.  Your body does not react in a normal way to insulin that it makes. Insulin lets sugars (glucose) go into cells in your body. This gives you energy. If you have type 2 diabetes, sugars cannot get into cells. This causes high blood sugar (hyperglycemia). Your doctor will set treatment goals for you. Generally, you should have these blood sugar levels:  Before meals (preprandial): 80-130 mg/dL (4.4-7.2 mmol/L).  After meals (postprandial): below 180 mg/dL (10 mmol/L).  A1c (hemoglobin A1c) level: less than 7%. Follow these instructions at home: Questions to ask your doctor  You may want to ask these questions: ? Do I need to meet with a diabetes educator? ? Where can I find a support group for people with diabetes? ? What equipment will I need to care for myself at home? ? What diabetes medicines do I need? When should I take them? ? How often do I need to check my blood sugar? ? What number can I call if I have questions? ? When is my next doctor's visit? General instructions  Take over-the-counter and prescription medicines only as told by your doctor.  Keep all follow-up visits as told by your doctor. This is important. Contact a doctor if:  Your blood sugar is at or above 240 mg/dL (13.3 mmol/L) for 2 days in a row.  You have been sick for 2 days or more, and you are not getting better.  You have had a fever for 2 days or more, and you are not getting better.  You have any of these problems for more than 6 hours: ? You cannot eat or drink. ? You feel sick to your stomach (nauseous). ? You throw up (vomit). ? You have watery poop (diarrhea). Get help right away if:  Your blood sugar is lower than 54 mg/dL (3 mmol/L).  You get confused.   You have trouble: ? Thinking clearly. ? Breathing.  You have moderate or large ketone levels in your pee (urine). Summary  Type 2 diabetes is a long-term (chronic) disease. Your pancreas may not make enough of a hormone called insulin, or your body may not react normally to insulin that it makes.  Take over-the-counter and prescription medicines only as told by your doctor.  Keep all follow-up visits as told by your doctor. This is important. This information is not intended to replace advice given to you by your health care provider. Make sure you discuss any questions you have with your health care provider. Document Released: 07/22/2008 Document Revised: 05/14/2017 Document Reviewed: 11/16/2015 Elsevier Interactive Patient Education  2019 [employer] Inc[employer]  

## 2018-11-10 NOTE — Assessment & Plan Note (Signed)
-  Never had colon cancer screening, history of colon cancer in his mother.  Referral placed to GI

## 2018-11-10 NOTE — Assessment & Plan Note (Signed)
Trial of trazodone at bedtime.

## 2018-11-10 NOTE — Progress Notes (Signed)
[name] - 68 y.o. male MRN [id number]  Date of birth: 1951-04-27  Subjective Chief Complaint  Patient presents with  . Diabetes    was diagnosed 2 years ago-was not taking his Metformin-started taking again one month ago-admits to fatigue and dizziness     HPI [name] is a 68 y.o. male with history of T2DM and insomnia here today to establish care with new pcp.  Last care was around 2-2.5 years ago when he was initially diagnosed with diabetes.   -T2DM:  Reports history of diabetes diagnosed a couple of years ago.  He was started on metformin at that time but had side effects of sweating, dizziness and nausea while taking this so he discontinued after a couple of months. He has not had follow up or checked his blood sugars at home since then.  His weight is down about 8-9 lbs over the past couple of years.  He denies polyuria/polydipsia.  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    -Insomnia:  Reports insomnia for years.  Prescribed alprazolam previously but did not want to take as he didn't want to get "hooked" on medication.  He has tried melatonin without improvement.    -Colon cancer screening:  Mother with history of colon cancer.  He has never had any type of screening in the past.  He would be willing to have colonoscopy.  He denies any symptoms.   ROS:  A comprehensive ROS was completed and negative except as noted per HPI  No Known Allergies  Past Medical History:  Diagnosis Date  . Diabetes mellitus without complication ([place])   . Insomnia     History reviewed. No pertinent surgical history.  Social History   Socioeconomic History  . Marital status: Single    Spouse name: Not on file  . Number of children: Not on file  . Years of education: Not on file  . Highest education level: Not on file  Occupational History  . Not on file  Social Needs  . Financial resource strain: Not on file  . Food insecurity:    Worry: Not on file    Inability:  Not on file  . Transportation needs:    Medical: Not on file    Non-medical: Not on file  Tobacco Use  . Smoking status: Never Smoker  . Smokeless tobacco: Never Used  Substance and Sexual Activity  . Alcohol use: No  . Drug use: No  . Sexual activity: Not on file  Lifestyle  . Physical activity:    Days per week: Not on file    Minutes per session: Not on file  . Stress: Not on file  Relationships  . Social connections:    Talks on phone: Not on file    Gets together: Not on file    Attends religious service: Not on file    Active member of club or organization: Not on file    Attends meetings of clubs or organizations: Not on file    Relationship status: Not on file  Other Topics Concern  . Not on file  Social History Narrative  . Not on file    Family History  Problem Relation Age of Onset  . Colon cancer Mother   . Diabetes Brother     Health Maintenance  Topic Date Due  . HEMOGLOBIN A1C  07-10-1951  . FOOT EXAM  06/09/1962  . OPHTHALMOLOGY EXAM  06/09/1962  . URINE MICROALBUMIN  06/09/1962  . TETANUS/TDAP  06/10/1971  . COLONOSCOPY  06/09/2002  . PNA vac Low Risk Adult (1 of 2 - PCV13) 06/09/2017  . INFLUENZA VACCINE  01/25/2019 (Originally 05/27/2018)  . Hepatitis C Screening  Completed    ----------------------------------------------------------------------------------------------------------------------------------------------------------------------------------------------------------------- Physical Exam BP 134/82   Pulse 82   Temp 97.6 F (36.4 C) (Oral)   Ht 5\' 7"  (1.702 m)   Wt 138 lb (62.6 kg)   SpO2 96%   BMI 21.61 kg/m   Physical Exam Constitutional:      Appearance: Normal appearance. He is not ill-appearing.  HENT:     Head: Normocephalic and atraumatic.     Nose: Nose normal.     Mouth/Throat:     Mouth: Mucous membranes are moist.  Eyes:     General: No scleral icterus. Neck:     Musculoskeletal: Normal range of motion and  neck supple.  Cardiovascular:     Rate and Rhythm: Normal rate and regular rhythm.  Pulmonary:     Effort: Pulmonary effort is normal.     Breath sounds: Normal breath sounds.  Abdominal:     General: Abdomen is flat. There is no distension.     Palpations: Abdomen is soft.  Skin:    General: Skin is warm and dry.     Findings: No rash.  Neurological:     General: No focal deficit present.     Mental Status: He is alert.  Psychiatric:        Mood and Affect: Mood normal.        Behavior: Behavior normal.     ------------------------------------------------------------------------------------------------------------------------------------------------------------------------------------------------------------------- Assessment and Plan  Essential hypertension -BP rechecked with improved reading, will continue to monitor for now.   Colon cancer screening -Never had colon cancer screening, history of colon cancer in his mother.  Referral placed to GI  Diabetes ([place]) -Off medications >2 years.  -Update labs today, expect glucose to be elevated.  -Discussed if significantly elevated he may need to use insulin for mgmt of his diabetes.  -Declines flu and pneumovax.   Insomnia Trial of trazodone at bedtime.

## 2018-11-10 NOTE — Assessment & Plan Note (Signed)
-  Off medications >2 years.  -Update labs today, expect glucose to be elevated.  -Discussed if significantly elevated he may need to use insulin for mgmt of his diabetes.  -Declines flu and pneumovax.

## 2018-11-12 ENCOUNTER — Telehealth: Payer: Self-pay | Admitting: Family Medicine

## 2018-11-12 ENCOUNTER — Ambulatory Visit (INDEPENDENT_AMBULATORY_CARE_PROVIDER_SITE_OTHER): Payer: Medicare HMO | Admitting: Family Medicine

## 2018-11-12 ENCOUNTER — Encounter: Payer: Self-pay | Admitting: Family Medicine

## 2018-11-12 VITALS — BP 152/84 | HR 92 | Temp 97.6°F | Ht 67.0 in | Wt 138.0 lb

## 2018-11-12 DIAGNOSIS — R69 Illness, unspecified: Secondary | ICD-10-CM | POA: Diagnosis not present

## 2018-11-12 DIAGNOSIS — E785 Hyperlipidemia, unspecified: Secondary | ICD-10-CM

## 2018-11-12 DIAGNOSIS — E1169 Type 2 diabetes mellitus with other specified complication: Secondary | ICD-10-CM | POA: Diagnosis not present

## 2018-11-12 DIAGNOSIS — I10 Essential (primary) hypertension: Secondary | ICD-10-CM | POA: Diagnosis not present

## 2018-11-12 DIAGNOSIS — E1165 Type 2 diabetes mellitus with hyperglycemia: Secondary | ICD-10-CM | POA: Diagnosis not present

## 2018-11-12 MED ORDER — INSULIN PEN NEEDLE 32G X 4 MM MISC: 1 refills | Status: DC

## 2018-11-12 MED ORDER — ATORVASTATIN CALCIUM 40 MG PO TABS: 40.0000 mg | ORAL_TABLET | Freq: Every day | ORAL | 1 refills | Status: DC

## 2018-11-12 MED ORDER — INSULIN GLARGINE 100 UNIT/ML SOLOSTAR PEN: 15.0000 [IU] | PEN_INJECTOR | Freq: Every day | SUBCUTANEOUS | 1 refills | Status: DC

## 2018-11-12 MED ORDER — BLOOD GLUCOSE METER KIT: PACK | 0 refills | Status: AC

## 2018-11-12 MED ORDER — LISINOPRIL 10 MG PO TABS: 10.0000 mg | ORAL_TABLET | Freq: Every day | ORAL | 1 refills | Status: DC

## 2018-11-12 NOTE — Assessment & Plan Note (Signed)
-  BP elevated again today, start lisinopril for htn and microalbuminuria.  -Discussed low salt diet.

## 2018-11-12 NOTE — Patient Instructions (Signed)
Insulin Injection Instructions, Single Insulin Dose, Adult A subcutaneous injection is a shot of medicine that is injected into the layer of fat and tissue between skin and muscle. People with type 1 diabetes must take insulin because their bodies do not make it. People with type 2 diabetes may need to take insulin.  There are many different types of insulin. The type of insulin that you take may determine how many injections you give yourself and when you need to give the injections. Supplies needed:  Soap and water to wash hands.  A new, unused insulin syringe.  Your insulin medication bottle (vial).  Alcohol wipes.  A disposal container that is meant for sharp items (sharps container), such as an empty plastic bottle with a cover. How to choose a site for injection The body absorbs insulin differently, depending on where the insulin is injected (injection site). It is best to inject insulin into the same body area each time (for example, always in the abdomen), but you should use a different spot in that area for each injection. Do not inject the insulin in the same spot each time. There are five main areas that can be used for injecting. These areas include:  Abdomen. This is the preferred area.  Front of thigh.  Upper, outer side of thigh.  Upper, outer side of arm.  Upper, outer part of buttock. How to give a single-dose insulin injection First, follow the steps for Get ready, then continue with the steps for Push air into the vial, then follow the steps for Fill the syringe, and finish with the steps for Inject the insulin. Get ready 1. Wash your hands with soap and water. If soap and water are not available, use hand sanitizer. 2. Before you give yourself an insulin injection, be sure to test your blood sugar level (blood glucose level) and write down that number. Follow any instructions from your health care provider about what to do if your blood glucose level is higher or  lower than your normal range. 3. Use a new, unused insulin syringe each time you need to inject insulin. 4. Check to make sure you have the correct type of insulin syringe for the concentration of insulin that you are using. 5. Check the expiration date and the type of insulin that you are using. 6. If you are using CLEAR insulin, check to see that it is clear and free of clumps. 7. Do not shake the vial to get it ready. Gently roll the vial between your palms several times. 8. Remove the plastic pop-top covering from the vial of insulin. This type of covering is present on a vial when it is new. 9. Use an alcohol wipe to clean the rubber top of the vial. 10. Remove the plastic cover from the syringe needle. Do not let the needle touch anything. Push air into the vial 1. To bring (draw up) air into the syringe, slowly pull back on the syringe plunger. Stop pulling the plunger when the dose indicator gets to the number of units that you will be using. 2. While you keep the vial right-side-up, poke the needle through the rubber top of the vial. Do not turn the vial upside down to do this. 3. Push the plunger all the way into the syringe. Doing that will push air into the vial. 4. Do not take the needle out of the vial yet. Fill the syringe  1. While the needle is still in the vial, turn the  vial upside down and hold it at eye level. 2. Slowly pull back on the plunger. Stop pulling the plunger when the dose indicator gets to the desired number of units. 3. If you see air bubbles in the syringe, slowly move the plunger up and down 2 or 3 times to make them go away. ? If you had to move the plunger to get rid of air bubbles, pull back the plunger until the dose indicator returns to the correct dose. 4. Remove the needle from the vial. Do not let the needle touch anything. Inject the insulin  1. Use an alcohol wipe to clean the site where you will be injecting the needle. Let the site  air-dry. 2. Hold the syringe in your writing hand like a pencil. 3. Use your other hand to pinch and hold about an inch (2.5 cm) of skin. Do not directly touch the cleaned part of the skin. 4. Gently but quickly, put the needle straight into the skin. The needle should be at a 90-degree angle (perpendicular) to the skin. 5. Push the needle in as far as it will go (to the hub). 6. When the needle is completely inserted into the skin, use your thumb or index finger of your writing hand to push the plunger all the way into the syringe to inject the insulin. 7. Let go of the skin that you are pinching. Continue to hold the syringe in place with your writing hand. 8. Wait 10 seconds, then pull the needle straight out of the skin. This will allow all of the insulin to go from the syringe and needle into your body. 9. Press and hold the alcohol wipe over the injection site until any bleeding stops. Do not rub the area. 10. Do not put the plastic cover back on the needle. 11. Discard the syringe and needle directly into a sharps container, such as an empty plastic bottle with a cover. How to throw away supplies  Discard all used needles in a puncture-proof sharps disposal container. You can ask your local pharmacy about where you can get this kind of disposal container, or you can use an empty plastic liquid laundry detergent bottle that has a cover.  Follow the disposal regulations for the area where you live. Do not use any syringe or needle more than one time.  Throw away empty vials in the regular trash. Questions to ask your health care provider  How often should I be taking insulin?  How often should I check my blood glucose?  What amount of insulin should I be taking at each time?  What are the side effects?  What should I do if my blood glucose is too high?  What should I do if my blood glucose is too low?  What should I do if I forget to take my insulin?  What number should I call  if I have questions? Where to find more information  American Diabetes Association (ADA): www.diabetes.org  American Association of Diabetes Educators (AADE) Patient Resources: https://www.diabeteseducator.org Summary  A subcutaneous injection is a shot of medicine that is injected into the layer of fat and tissue between skin and muscle.  Before you give yourself an insulin injection, be sure to test your blood sugar level (blood glucose level) and write down that number.  The type of insulin that you take may determine how many injections you give yourself and when you need to give the injections.  Check the expiration date and the type  of insulin that you are using.  It is best to inject insulin into the same body area each time (for example, always in the abdomen), but you should use a different spot in that area for each injection. This information is not intended to replace advice given to you by your health care provider. Make sure you discuss any questions you have with your health care provider. Document Released: 11/16/2015 Document Revised: 05/06/2017 Document Reviewed: 11/16/2015 Elsevier Interactive Patient Education  2019 [employer].    Type 2 Diabetes Mellitus, Self Care, Adult When you have type 2 diabetes (type 2 diabetes mellitus), you must make sure your blood sugar (glucose) stays in a healthy range. You can do this with:  Nutrition.  Exercise.  Lifestyle changes.  Medicines or insulin, if needed.  Support from your doctors and others. How to stay aware of blood sugar   Check your blood sugar level every day, as often as told.  Have your A1c (hemoglobin A1c) level checked two or more times a year. Have it checked more often if your doctor tells you to. Your doctor will set personal treatment goals for you. Generally, you should have these blood sugar levels:  Before meals (preprandial): 80-130 mg/dL (4.4-7.2 mmol/L).  After meals (postprandial):  below 180 mg/dL (10 mmol/L).  A1c level: less than 7%. How to manage high and low blood sugar Signs of high blood sugar High blood sugar is called hyperglycemia. Know the signs of high blood sugar. Signs may include:  Feeling: ? Thirsty. ? Hungry. ? Very tired.  Needing to pee (urinate) more than usual.  Blurry vision. Signs of low blood sugar Low blood sugar is called hypoglycemia. This is when blood sugar is at or below 70 mg/dL (3.9 mmol/L). Signs may include:  Feeling: ? Hungry. ? Worried or nervous (anxious). ? Sweaty and clammy. ? Confused. ? Dizzy. ? Sleepy. ? Sick to your stomach (nauseous).  Having: ? A fast heartbeat. ? A headache. ? A change in your vision. ? Jerky movements that you cannot control (seizure). ? Tingling or no feeling (numbness) around your mouth, lips, or tongue.  Having trouble with: ? Moving (coordination). ? Sleeping. ? Passing out (fainting). ? Getting upset easily (irritability). Treating low blood sugar To treat low blood sugar, eat or drink something sugary right away. If you can think clearly and swallow safely, follow the 15:15 rule:  Take 15 grams of a fast-acting carb (carbohydrate). Talk with your doctor about how much you should take.  Some fast-acting carbs are: ? Sugar tablets (glucose pills). Take 3-4 pills. ? 6-8 pieces of hard candy. ? 4-6 oz (120-150 mL) of fruit juice. ? 4-6 oz (120-150 mL) of regular (not diet) soda. ? 1 Tbsp (15 mL) honey or sugar.  Check your blood sugar 15 minutes after you take the carb.  If your blood sugar is still at or below 70 mg/dL (3.9 mmol/L), take 15 grams of a carb again.  If your blood sugar does not go above 70 mg/dL (3.9 mmol/L) after 3 tries, get help right away.  After your blood sugar goes back to normal, eat a meal or a snack within 1 hour. Treating very low blood sugar If your blood sugar is at or below 54 mg/dL (3 mmol/L), you have very low blood sugar (severe  hypoglycemia). This is an emergency. Do not wait to see if the symptoms will go away. Get medical help right away. Call your local emergency services (911 in the  [place] If you have very low blood sugar and you cannot eat or drink, you may need a glucagon shot (injection). A family member or friend should learn how to check your blood sugar and how to give you a glucagon shot. Ask your doctor if you need to have a glucagon shot kit at home. Follow these instructions at home: Medicine  Take insulin and diabetes medicines as told.  If your doctor says you should take more or less insulin and medicines, do this exactly as told.  Do not run out of insulin or medicines. Having diabetes can raise your risk for other long-term conditions. These include heart disease and kidney disease. Your doctor may prescribe medicines to help you not have these problems. Food   Make healthy food choices. These include: ? Chicken, fish, egg whites, and beans. ? Oats, whole wheat, bulgur, brown rice, quinoa, and millet. ? Fresh fruits and vegetables. ? Low-fat dairy products. ? Nuts, avocado, olive oil, and canola oil.  Meet with a food specialist (dietitian). He or she can help you make an eating plan that is right for you.  Follow instructions from your doctor about what you cannot eat or drink.  Drink enough fluid to keep your pee (urine) pale yellow.  Keep track of carbs that you eat. Do this by reading food labels and learning food serving sizes.  Follow your sick day plan when you cannot eat or drink normally. Make this plan with your doctor so it is ready to use. Activity  Exercise 3 or more times a week.  Do not go more than 2 days without exercising.  Talk with your doctor before you start a new exercise. Your doctor may need to tell you to change: ? How much insulin or medicines you take. ? How much food you eat. Lifestyle  Do not use any tobacco products. These include cigarettes, chewing  tobacco, and e-cigarettes. If you need help quitting, ask your doctor.  Ask your doctor how much alcohol is safe for you.  Learn to deal with stress. If you need help with this, ask your doctor. Body care   Stay up to date with your shots (immunizations).  Have your eyes and feet checked by a doctor as often as told.  Check your skin and feet every day. Check for cuts, bruises, redness, blisters, or sores.  Brush your teeth and gums two times a day. Floss one or more times a day.  Go to the dentist one or more times every 6 months.  Stay at a healthy weight. General instructions  Take over-the-counter and prescription medicines only as told by your doctor.  Share your diabetes care plan with: ? Your work or school. ? People you live with.  Carry a card or wear jewelry that says you have diabetes.  Keep all follow-up visits as told by your doctor. This is important. Questions to ask your doctor  Do I need to meet with a diabetes educator?  Where can I find a support group for people with diabetes? Where to find more information To learn more about diabetes, visit:  American Diabetes Association: www.diabetes.org  American Association of Diabetes Educators: www.diabeteseducator.org Summary  When you have type 2 diabetes, you must make sure your blood sugar (glucose) stays in a healthy range.  Check your blood sugar every day, as often as told.  Having diabetes can raise your risk for other conditions. Your doctor may prescribe medicines to help you not have these problems.  Keep all follow-up visits as told by your doctor. This is important. This information is not intended to replace advice given to you by your health care provider. Make sure you discuss any questions you have with your health care provider. Document Released: 02/04/2016 Document Revised: 04/05/2018 Document Reviewed: 11/16/2015 Elsevier Interactive Patient Education  2019 [employer].

## 2018-11-12 NOTE — Assessment & Plan Note (Signed)
-  Lipids are not at goal, will start atorvastatin 40mg  The 10-year ASCVD risk score Mikey Bussing DC Brooke Bonito., et al., 2013) is: 40.4%   Values used to calculate the score:     Age: 68 years     Sex: Male     Is Non-Hispanic African American: Yes     Diabetic: Yes     Tobacco smoker: No     Systolic Blood Pressure: 330 mmHg     Is BP treated: Yes     HDL Cholesterol: 44.5 mg/dL     Total Cholesterol: 225 mg/dL

## 2018-11-12 NOTE — Assessment & Plan Note (Signed)
-  Diabetes is poorly controlled -Discussed recommendation of starting insulin for management of diabetes.  I told him that he may not feel great initially after starting insulin as his blood sugars have been quite elevated for long period of time and his body will have to get used to what a normal blood sugar is.   -Reviewed how to monitor glucose using a glucometer -Reviewed how to use insulin pen.  Instructional video shown.  -Referral placed to diabetes educator and nutritionist.   -I will see him back in 6 weeks.   >25 minutes spent with patient with 50% of time spent counseling and coordinating care as outlined above.

## 2018-11-12 NOTE — Progress Notes (Signed)
[name] - 68 y.o. male MRN [id number]  Date of birth: 1951-03-16  Subjective Chief Complaint  Patient presents with  . Diabetes    HPI [name] is a 68 y.o. male with history of T2DM here today for follow up of recent lab work.   -T2DM:  Recent A1c of 11.8%.  Had been on metformin in the past but did not tolerate well at all.  Today he does endorse some urinary frequency.  Discussed starting insulin if A1c >10 at last visit and he is open to starting this.    -HLD:  Recent lipids returned elevated with LDL of 156.  He has never taken a statin medication in the past.  He is willing to try this.   -Elevated microalbumin:  Microalbumin elevated with elevated blood pressure today.  Discussed previously effects of diabetes and blood pressure on the kidneys.    ROS:  A comprehensive ROS was completed and negative except as noted per HPI  No Known Allergies  Past Medical History:  Diagnosis Date  . Diabetes mellitus without complication ([place])   . Insomnia     No past surgical history on file.  Social History   Socioeconomic History  . Marital status: Single    Spouse name: Not on file  . Number of children: Not on file  . Years of education: Not on file  . Highest education level: Not on file  Occupational History  . Not on file  Social Needs  . Financial resource strain: Not on file  . Food insecurity:    Worry: Not on file    Inability: Not on file  . Transportation needs:    Medical: Not on file    Non-medical: Not on file  Tobacco Use  . Smoking status: Never Smoker  . Smokeless tobacco: Never Used  Substance and Sexual Activity  . Alcohol use: No  . Drug use: No  . Sexual activity: Not on file  Lifestyle  . Physical activity:    Days per week: Not on file    Minutes per session: Not on file  . Stress: Not on file  Relationships  . Social connections:    Talks on phone: Not on file    Gets together: Not on file    Attends religious service:  Not on file    Active member of club or organization: Not on file    Attends meetings of clubs or organizations: Not on file    Relationship status: Not on file  Other Topics Concern  . Not on file  Social History Narrative  . Not on file    Family History  Problem Relation Age of Onset  . Colon cancer Mother   . Diabetes Brother     Health Maintenance  Topic Date Due  . FOOT EXAM  06/09/1962  . OPHTHALMOLOGY EXAM  06/09/1962  . TETANUS/TDAP  06/10/1971  . COLONOSCOPY  06/09/2002  . PNA vac Low Risk Adult (1 of 2 - PCV13) 06/09/2017  . INFLUENZA VACCINE  01/25/2019 (Originally 05/27/2018)  . HEMOGLOBIN A1C  05/11/2019  . URINE MICROALBUMIN  11/11/2019  . Hepatitis C Screening  Completed    ----------------------------------------------------------------------------------------------------------------------------------------------------------------------------------------------------------------- Physical Exam BP (!) 152/84   Pulse 92   Temp 97.6 F (36.4 C) (Oral)   Ht 5\' 7"  (1.702 m)   Wt 138 lb (62.6 kg)   SpO2 96%   BMI 21.61 kg/m   Physical Exam Constitutional:      Appearance: Normal appearance.  HENT:     Head: Normocephalic and atraumatic.  Cardiovascular:     Rate and Rhythm: Normal rate and regular rhythm.  Pulmonary:     Effort: Pulmonary effort is normal.     Breath sounds: Normal breath sounds.  Neurological:     General: No focal deficit present.     Mental Status: He is alert.  Psychiatric:        Mood and Affect: Mood normal.        Behavior: Behavior normal.     ------------------------------------------------------------------------------------------------------------------------------------------------------------------------------------------------------------------- Assessment and Plan  Essential hypertension -BP elevated again today, start lisinopril for htn and microalbuminuria.  -Discussed low salt diet.  Uncontrolled type 2  diabetes mellitus with hyperglycemia ([facility]) -Diabetes is poorly controlled -Discussed recommendation of starting insulin for management of diabetes.  I told him that he may not feel great initially after starting insulin as his blood sugars have been quite elevated for long period of time and his body will have to get used to what a normal blood sugar is.   -Reviewed how to monitor glucose using a glucometer -Reviewed how to use insulin pen.  Instructional video shown.  -Referral placed to diabetes educator and nutritionist.   -I will see him back in 6 weeks.   >25 minutes spent with patient with 50% of time spent counseling and coordinating care as outlined above.   Hyperlipidemia associated with type 2 diabetes mellitus ([place]) -Lipids are not at goal, will start atorvastatin 40mg  The 10-year ASCVD risk score Mikey Bussing DC Brooke Bonito., et al., 2013) is: 40.4%   Values used to calculate the score:     Age: 78 years     Sex: Male     Is Non-Hispanic African American: Yes     Diabetic: Yes     Tobacco smoker: No     Systolic Blood Pressure: 631 mmHg     Is BP treated: Yes     HDL Cholesterol: 44.5 mg/dL     Total Cholesterol: 225 mg/dL

## 2018-11-12 NOTE — Telephone Encounter (Signed)
Copied from [place] [id number]. Topic: Quick Communication - Lab Results (Clinic Use ONLY) >> Nov 12, 2018 10:51 AM Shanon Ace, CMA wrote: Called patient to inform them of 11/10/2018 lab results. When patient returns call, triage nurse may disclose results. >> Nov 12, 2018 11:15 AM Bea Graff, NT wrote: Pts girlfriend calling back for lab results and she would like to see if she can pick up a copy of the results and would like diabetic info.

## 2018-11-12 NOTE — Telephone Encounter (Signed)
Dr. Zigmund Daniel aware   Copied from [place] [id number]. Topic: General - Other >> Nov 12, 2018  1:16 PM [name] wrote: Reason for CRM: pt girlfriend phyllis Tamala Julian is calling and does not want to mention in front of her boyfriend/patient to dr [name] that he wet the bed sometimes. Pt says he be sweating. Pt has an appt today

## 2018-11-12 NOTE — Progress Notes (Signed)
-  Lab work shows uncontrolled diabetes with elevated blood sugar and a1c.  With current a1c I would recommend starting insulin to lower blood glucose.  I would like for him to schedule an appointment with me to review insulin use and discuss the rest of his lab work.

## 2018-11-16 ENCOUNTER — Encounter: Payer: Self-pay | Admitting: Registered"

## 2018-11-16 ENCOUNTER — Encounter: Payer: Medicare HMO | Attending: Family Medicine | Admitting: Registered"

## 2018-11-16 DIAGNOSIS — E1165 Type 2 diabetes mellitus with hyperglycemia: Secondary | ICD-10-CM | POA: Diagnosis not present

## 2018-11-16 NOTE — Patient Instructions (Addendum)
Contour Next has recently been rated as one of the most accurate meters, other ones rated with good accuracy are Accu-chek aviva, or [facility] ReliOn Confirm, [facility] Advanced, FreeStyle Lite.  Aim to eat balanced meals and snacks, include plenty of vegetables and protein.  Remember to eat protein with fruit. Use the snack sheet handout for some balanced ideas  Remember to check your blood sugar when you have low blood sugar symptoms and treat with rule of 15 when 70 or below.  Consider including physical activity on days when you are not active at work. Be sure to eat in the afternoons and not exercise on an empty stomach.

## 2018-11-16 NOTE — Progress Notes (Signed)
Diabetes Self-Management Education  Visit Type: First/Initial  Appt. Start Time: 1530 Appt. End Time: 7322  11/18/2018  [name], identified by name and date of birth, is a 68 y.o. male with a diagnosis of Diabetes: Type 2.   This patient is accompanied in the office by his significant other.  ASSESSMENT  There were no vitals taken for this visit. There is no height or weight on file to calculate BMI.   Pt is here with signficant other who helps to advocate for his care and she reports neither she nor the pharmacist could get his One Touch Ultra 2 to work. Pt states he is expecting another meter in the mail soon. They state the meter read 404 then gave and error message.  Pt denies polyuria, only 1x per night. 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 Pt reports some hypoglycemia symptoms but does not check BG. Pt was talking about going up and down ladders and on roofs, RD strongly cautioned against letting his blood sugar going too low due to risk of falling.  Pt states he is very active at work and does not see the need for additional physical activity.   Pt states he has been cooking all his life and enjoys cooking healthy foods. Apples and pb or wheat crackers and cheese  Sleep: pt states all his life has been terrible. Is not tired until 1-2 am, wakes up at 6 am   Pt was not interested in a follow up visit.  Diabetes Self-Management Education - 11/16/18 1543      Visit Information   Visit Type  First/Initial      Initial Visit   Diabetes Type  Type 2    Are you currently following a meal plan?  Yes    Are you taking your medications as prescribed?  Yes    Date Diagnosed  Insulin       Health Coping   How would you rate your overall health?  Good      Psychosocial Assessment   Patient Belief/Attitude about Diabetes  Motivated to manage diabetes    How often do you need to have someone help you  when you read instructions, pamphlets, or other written materials from your doctor or pharmacy?  1 - Never    What is the last grade level you completed in school?  HS      Complications   Last HgB A1C per patient/outside source  11.8 %    How often do you check your blood sugar?  1-2 times/day    Fasting Blood glucose range (mg/dL)  >200    Have you had a dilated eye exam in the past 12 months?  No    Have you had a dental exam in the past 12 months?  No    Are you checking your feet?  Yes    How many days per week are you checking your feet?  7      Dietary Intake   Breakfast  grits, eggs OR cereal, OJ- 16 oz    Snack (morning)  none OR PB crackers OR banana OR chips    Lunch  [place] sandwich, potato chips, water OR none    Snack (afternoon)  none OR chicken sandwich     Dinner  string beans, chicken or fish, yellow rice OR cabbage, pinto beans    Snack (evening)  fruit  Beverage(s)  water, OJ, golden peak tea regular and diet, milk whole      Exercise   Exercise Type  Light (walking / raking leaves)    How many days per week to you exercise?  2    How many minutes per day do you exercise?  15    Total minutes per week of exercise  30      Patient Education   Previous Diabetes Education  No    Nutrition management   Role of diet in the treatment of diabetes and the relationship between the three main macronutrients and blood glucose level    Physical activity and exercise   Role of exercise on diabetes management, blood pressure control and cardiac health.    Monitoring  Other (comment)   monitor rated with high accuracy   Acute complications  Taught treatment of hypoglycemia - the 15 rule.      Individualized Goals (developed by patient)   Nutrition  General guidelines for healthy choices and portions discussed    Physical Activity  Exercise 3-5 times per week    Medications  take my medication as prescribed    Monitoring   test my blood glucose as discussed       Outcomes   Expected Outcomes  Demonstrated interest in learning. Expect positive outcomes    Future DMSE  PRN    Program Status  Completed       Individualized Plan for Diabetes Self-Management Training:   Learning Objective:  Patient will have a greater understanding of diabetes self-management. Patient education plan is to attend individual and/or group sessions per assessed needs and concerns.   Patient Instructions  Contour Next has recently been rated as one of the most accurate meters, other ones rated with good accuracy are Accu-chek aviva, or [facility] ReliOn Confirm, [facility] Advanced, FreeStyle Lite.  Aim to eat balanced meals and snacks, include plenty of vegetables and protein.  Remember to eat protein with fruit. Use the snack sheet handout for some balanced ideas  Remember to check your blood sugar when you have low blood sugar symptoms and treat with rule of 15 when 70 or below.  Consider including physical activity on days when you are not active at work. Be sure to eat in the afternoons and not exercise on an empty stomach.   Expected Outcomes:  Demonstrated interest in learning. Expect positive outcomes  Education material provided: ADA Diabetes: Where Do I Begin?, snack sheet  If problems or questions, patient to contact team via:  Phone  Future DSME appointment: PRN

## 2018-11-18 ENCOUNTER — Ambulatory Visit: Payer: Medicare HMO | Admitting: Registered"

## 2018-11-22 ENCOUNTER — Telehealth: Payer: Self-pay

## 2018-11-22 NOTE — Telephone Encounter (Signed)
Copied from [place]. Topic: Referral - Request for Referral >> Nov 22, 2018  3:27 PM Ahmed Prima L wrote: Has patient seen PCP for this complaint? Yes *If NO, is insurance requiring patient see PCP for this issue before PCP can refer them? N/A Referral for which specialty: Endocrinologist  Preferred provider/office: Dr Dwyane Dee Reason for referral: The nutritionist was not helpful/Diabetes

## 2018-11-22 NOTE — Telephone Encounter (Signed)
Copied from [place]. Topic: Referral - Request for Referral >> Nov 22, 2018  3:27 PM Ahmed Prima L wrote: Has patient seen PCP for this complaint? Yes *If NO, is insurance requiring patient see PCP for this issue before PCP can refer them? N/A Referral for which specialty: Endocrinologist  Preferred provider/office: Dr Dwyane Dee Reason for referral: The nutritionist was not helpful/Diabetes >> Nov 22, 2018  4:00 PM Yvette Rack wrote: Pt spouse requests that the office disregard previous message for a referral to an Endocrinologist.

## 2018-11-23 NOTE — Telephone Encounter (Signed)
Spoke with patient, did not need referral to Endocrinology. He is doing well getting used to taking insulin, stated he was feeling better.

## 2018-11-25 ENCOUNTER — Encounter: Payer: Self-pay | Admitting: Family Medicine

## 2018-11-25 ENCOUNTER — Ambulatory Visit (INDEPENDENT_AMBULATORY_CARE_PROVIDER_SITE_OTHER): Payer: Medicare HMO | Admitting: Family Medicine

## 2018-11-25 DIAGNOSIS — M19042 Primary osteoarthritis, left hand: Secondary | ICD-10-CM | POA: Diagnosis not present

## 2018-11-25 DIAGNOSIS — E1165 Type 2 diabetes mellitus with hyperglycemia: Secondary | ICD-10-CM

## 2018-11-25 DIAGNOSIS — M19041 Primary osteoarthritis, right hand: Secondary | ICD-10-CM

## 2018-11-25 DIAGNOSIS — M1990 Unspecified osteoarthritis, unspecified site: Secondary | ICD-10-CM | POA: Insufficient documentation

## 2018-11-25 NOTE — Assessment & Plan Note (Signed)
Discussed changes are consistent with OA. Not very bothersome at this time, will continue to monitor.

## 2018-11-25 NOTE — Assessment & Plan Note (Signed)
-  Blood sugars improved with starting lantus, denies hypoglycemia -Working on lifestyle/dietary changes.  Reviewed high carb foods and carb counting.

## 2018-11-25 NOTE — Patient Instructions (Addendum)
Carbs Snacks=15 grams of carbs Meals=50-60g of carbs  Apps to help with management: FatSecret. MyFitnessPal. LifeSum. CarbManager. MyNetDiary. Lose It! Calorie, Carb and Fat Counter. MyKeto. Carb in Foods. Calorie Counter and Diet Tracker.    Carbohydrate Counting for Diabetes Mellitus, Adult  Carbohydrate counting is a method of keeping track of how many carbohydrates you eat. Eating carbohydrates naturally increases the amount of sugar (glucose) in the blood. Counting how many carbohydrates you eat helps keep your blood glucose within normal limits, which helps you manage your diabetes (diabetes mellitus). It is important to know how many carbohydrates you can safely have in each meal. This is different for every person. A diet and nutrition specialist (registered dietitian) can help you make a meal plan and calculate how many carbohydrates you should have at each meal and snack. Carbohydrates are found in the following foods:  Grains, such as breads and cereals.  Dried beans and soy products.  Starchy vegetables, such as potatoes, peas, and corn.  Fruit and fruit juices.  Milk and yogurt.  Sweets and snack foods, such as cake, cookies, candy, chips, and soft drinks. How do I count carbohydrates? There are two ways to count carbohydrates in food. You can use either of the methods or a combination of both. Reading "Nutrition Facts" on packaged food The "Nutrition Facts" list is included on the labels of almost all packaged foods and beverages in the [place] It includes:  The serving size.  Information about nutrients in each serving, including the grams (g) of carbohydrate per serving. To use the "Nutrition Facts":  Decide how many servings you will have.  Multiply the number of servings by the number of carbohydrates per serving.  The resulting number is the total amount of carbohydrates that you will be having. Learning standard serving sizes of other foods When you  eat carbohydrate foods that are not packaged or do not include "Nutrition Facts" on the label, you need to measure the servings in order to count the amount of carbohydrates:  Measure the foods that you will eat with a food scale or measuring cup, if needed.  Decide how many standard-size servings you will eat.  Multiply the number of servings by 15. Most carbohydrate-rich foods have about 15 g of carbohydrates per serving. ? For example, if you eat 8 oz (170 g) of strawberries, you will have eaten 2 servings and 30 g of carbohydrates (2 servings x 15 g = 30 g).  For foods that have more than one food mixed, such as soups and casseroles, you must count the carbohydrates in each food that is included. The following list contains standard serving sizes of common carbohydrate-rich foods. Each of these servings has about 15 g of carbohydrates:   hamburger bun or  English muffin.   oz (15 mL) syrup.   oz (14 g) jelly.  1 slice of bread.  1 six-inch tortilla.  3 oz (85 g) cooked rice or pasta.  4 oz (113 g) cooked dried beans.  4 oz (113 g) starchy vegetable, such as peas, corn, or potatoes.  4 oz (113 g) hot cereal.  4 oz (113 g) mashed potatoes or  of a large baked potato.  4 oz (113 g) canned or frozen fruit.  4 oz (120 mL) fruit juice.  4-6 crackers.  6 chicken nuggets.  6 oz (170 g) unsweetened dry cereal.  6 oz (170 g) plain fat-free yogurt or yogurt sweetened with artificial sweeteners.  8 oz (240 mL) milk.  8 oz (170 g) fresh fruit or one small piece of fruit.  24 oz (680 g) popped popcorn. Example of carbohydrate counting Sample meal  3 oz (85 g) chicken breast.  6 oz (170 g) brown rice.  4 oz (113 g) corn.  8 oz (240 mL) milk.  8 oz (170 g) strawberries with sugar-free whipped topping. Carbohydrate calculation 1. Identify the foods that contain carbohydrates: ? Rice. ? Corn. ? Milk. ? Strawberries. 2. Calculate how many servings you have of  each food: ? 2 servings rice. ? 1 serving corn. ? 1 serving milk. ? 1 serving strawberries. 3. Multiply each number of servings by 15 g: ? 2 servings rice x 15 g = 30 g. ? 1 serving corn x 15 g = 15 g. ? 1 serving milk x 15 g = 15 g. ? 1 serving strawberries x 15 g = 15 g. 4. Add together all of the amounts to find the total grams of carbohydrates eaten: ? 30 g + 15 g + 15 g + 15 g = 75 g of carbohydrates total. Summary  Carbohydrate counting is a method of keeping track of how many carbohydrates you eat.  Eating carbohydrates naturally increases the amount of sugar (glucose) in the blood.  Counting how many carbohydrates you eat helps keep your blood glucose within normal limits, which helps you manage your diabetes.  A diet and nutrition specialist (registered dietitian) can help you make a meal plan and calculate how many carbohydrates you should have at each meal and snack. This information is not intended to replace advice given to you by your health care provider. Make sure you discuss any questions you have with your health care provider. Document Released: 10/13/2005 Document Revised: 04/22/2017 Document Reviewed: 03/26/2016 Elsevier Interactive Patient Education  2019 [employer]. Diabetes Mellitus and Nutrition, Adult When you have diabetes (diabetes mellitus), it is very important to have healthy eating habits because your blood sugar (glucose) levels are greatly affected by what you eat and drink. Eating healthy foods in the appropriate amounts, at about the same times every day, can help you:  Control your blood glucose.  Lower your risk of heart disease.  Improve your blood pressure.  Reach or maintain a healthy weight. Every person with diabetes is different, and each person has different needs for a meal plan. Your health care provider may recommend that you work with a diet and nutrition specialist (dietitian) to make a meal plan that is best for you. Your meal  plan may vary depending on factors such as:  The calories you need.  The medicines you take.  Your weight.  Your blood glucose, blood pressure, and cholesterol levels.  Your activity level.  Other health conditions you have, such as heart or kidney disease. How do carbohydrates affect me? Carbohydrates, also called carbs, affect your blood glucose level more than any other type of food. Eating carbs naturally raises the amount of glucose in your blood. Carb counting is a method for keeping track of how many carbs you eat. Counting carbs is important to keep your blood glucose at a healthy level, especially if you use insulin or take certain oral diabetes medicines. It is important to know how many carbs you can safely have in each meal. This is different for every person. Your dietitian can help you calculate how many carbs you should have at each meal and for each snack. Foods that contain carbs include:  Bread, cereal, rice, pasta, and crackers.  Potatoes and corn.  Peas, beans, and lentils.  Milk and yogurt.  Fruit and juice.  Desserts, such as cakes, cookies, ice cream, and candy. How does alcohol affect me? Alcohol can cause a sudden decrease in blood glucose (hypoglycemia), especially if you use insulin or take certain oral diabetes medicines. Hypoglycemia can be a life-threatening condition. Symptoms of hypoglycemia (sleepiness, dizziness, and confusion) are similar to symptoms of having too much alcohol. If your health care provider says that alcohol is safe for you, follow these guidelines:  Limit alcohol intake to no more than 1 drink per day for nonpregnant women and 2 drinks per day for men. One drink equals 12 oz of beer, 5 oz of wine, or 1 oz of hard liquor.  Do not drink on an empty stomach.  Keep yourself hydrated with water, diet soda, or unsweetened iced tea.  Keep in mind that regular soda, juice, and other mixers may contain a lot of sugar and must be  counted as carbs. What are tips for following this plan?  Reading food labels  Start by checking the serving size on the "Nutrition Facts" label of packaged foods and drinks. The amount of calories, carbs, fats, and other nutrients listed on the label is based on one serving of the item. Many items contain more than one serving per package.  Check the total grams (g) of carbs in one serving. You can calculate the number of servings of carbs in one serving by dividing the total carbs by 15. For example, if a food has 30 g of total carbs, it would be equal to 2 servings of carbs.  Check the number of grams (g) of saturated and trans fats in one serving. Choose foods that have low or no amount of these fats.  Check the number of milligrams (mg) of salt (sodium) in one serving. Most people should limit total sodium intake to less than 2,300 mg per day.  Always check the nutrition information of foods labeled as "low-fat" or "nonfat". These foods may be higher in added sugar or refined carbs and should be avoided.  Talk to your dietitian to identify your daily goals for nutrients listed on the label. Shopping  Avoid buying canned, premade, or processed foods. These foods tend to be high in fat, sodium, and added sugar.  Shop around the outside edge of the grocery store. This includes fresh fruits and vegetables, bulk grains, fresh meats, and fresh dairy. Cooking  Use low-heat cooking methods, such as baking, instead of high-heat cooking methods like deep frying.  Cook using healthy oils, such as olive, canola, or sunflower oil.  Avoid cooking with butter, cream, or high-fat meats. Meal planning  Eat meals and snacks regularly, preferably at the same times every day. Avoid going long periods of time without eating.  Eat foods high in fiber, such as fresh fruits, vegetables, beans, and whole grains. Talk to your dietitian about how many servings of carbs you can eat at each meal.  Eat 4-6  ounces (oz) of lean protein each day, such as lean meat, chicken, fish, eggs, or tofu. One oz of lean protein is equal to: ? 1 oz of meat, chicken, or fish. ? 1 egg. ?  cup of tofu.  Eat some foods each day that contain healthy fats, such as avocado, nuts, seeds, and fish. Lifestyle  Check your blood glucose regularly.  Exercise regularly as told by your health care provider. This may include: ? 150 minutes of moderate-intensity or  vigorous-intensity exercise each week. This could be brisk walking, biking, or water aerobics. ? Stretching and doing strength exercises, such as yoga or weightlifting, at least 2 times a week.  Take medicines as told by your health care provider.  Do not use any products that contain nicotine or tobacco, such as cigarettes and e-cigarettes. If you need help quitting, ask your health care provider.  Work with a Social worker or diabetes educator to identify strategies to manage stress and any emotional and social challenges. Questions to ask a health care provider  Do I need to meet with a diabetes educator?  Do I need to meet with a dietitian?  What number can I call if I have questions?  When are the best times to check my blood glucose? Where to find more information:  American Diabetes Association: diabetes.org  Academy of Nutrition and Dietetics: www.eatright.[employer] of Diabetes and Digestive and Kidney Diseases (NIH): [employer] Summary  A healthy meal plan will help you control your blood glucose and maintain a healthy lifestyle.  Working with a diet and nutrition specialist (dietitian) can help you make a meal plan that is best for you.  Keep in mind that carbohydrates (carbs) and alcohol have immediate effects on your blood glucose levels. It is important to count carbs and to use alcohol carefully. This information is not intended to replace advice given to you by your health care provider. Make sure you discuss any  questions you have with your health care provider. Document Released: 07/10/2005 Document Revised: 05/13/2017 Document Reviewed: 11/17/2016 Elsevier Interactive Patient Education  2019 [employer].

## 2018-11-25 NOTE — Progress Notes (Signed)
[name] - 68 y.o. male MRN [id number]  Date of birth: 20-Nov-1950  Subjective Chief Complaint  Patient presents with  . Hand swelling    right hand ongoing for one month    HPI [name] is a 68 y.o. male here today with complaint of R hand swelling.  Swelling has been ongoing for about 1 month.  He denies significant pain with this.  He does have some burning and tingling in fingertips.  He denies recent injury.  He does work in [employer] so uses his hands quite a bit and is R hand dominant.  Symptoms improve with ibuprofen.    He also recently started on insulin for his diabetes.  Blood sugars have been 130-140.  The met with a nutritionist but did not find this to be very helpful.  Still have questions about what foods he should limit.    ROS:  A comprehensive ROS was completed and negative except as noted per HPI  No Known Allergies  Past Medical History:  Diagnosis Date  . Diabetes mellitus without complication ([place])   . Insomnia     No past surgical history on file.  Social History   Socioeconomic History  . Marital status: Single    Spouse name: Not on file  . Number of children: Not on file  . Years of education: Not on file  . Highest education level: Not on file  Occupational History  . Not on file  Social Needs  . Financial resource strain: Not on file  . Food insecurity:    Worry: Not on file    Inability: Not on file  . Transportation needs:    Medical: Not on file    Non-medical: Not on file  Tobacco Use  . Smoking status: Never Smoker  . Smokeless tobacco: Never Used  Substance and Sexual Activity  . Alcohol use: No  . Drug use: No  . Sexual activity: Not on file  Lifestyle  . Physical activity:    Days per week: Not on file    Minutes per session: Not on file  . Stress: Not on file  Relationships  . Social connections:    Talks on phone: Not on file    Gets together: Not on file    Attends religious service: Not on file   Active member of club or organization: Not on file    Attends meetings of clubs or organizations: Not on file    Relationship status: Not on file  Other Topics Concern  . Not on file  Social History Narrative  . Not on file    Family History  Problem Relation Age of Onset  . Colon cancer Mother   . Diabetes Brother     Health Maintenance  Topic Date Due  . FOOT EXAM  06/09/1962  . OPHTHALMOLOGY EXAM  06/09/1962  . TETANUS/TDAP  06/10/1971  . COLONOSCOPY  06/09/2002  . PNA vac Low Risk Adult (1 of 2 - PCV13) 06/09/2017  . INFLUENZA VACCINE  01/25/2019 (Originally 05/27/2018)  . HEMOGLOBIN A1C  05/11/2019  . Hepatitis C Screening  Completed    ----------------------------------------------------------------------------------------------------------------------------------------------------------------------------------------------------------------- Physical Exam BP 128/76   Pulse 80   Temp 97.6 F (36.4 C) (Oral)   Ht 5' 7"  (1.702 m)   Wt 151 lb (68.5 kg)   SpO2 96%   BMI 23.65 kg/m   Physical Exam Constitutional:      Appearance: Normal appearance.  HENT:     Head: Normocephalic and atraumatic.  Cardiovascular:     Rate and Rhythm: Normal rate and regular rhythm.  Pulmonary:     Effort: Pulmonary effort is normal.     Breath sounds: Normal breath sounds.  Musculoskeletal:     Comments: Swelling of DIP and PIP joints without significant tenderness if b/l hands, R>L  Skin:    General: Skin is warm and dry.  Neurological:     General: No focal deficit present.     Mental Status: He is alert.  Psychiatric:        Behavior: Behavior normal.     ------------------------------------------------------------------------------------------------------------------------------------------------------------------------------------------------------------------- Assessment and Plan  Osteoarthritis Discussed changes are consistent with OA. Not very bothersome at this  time, will continue to monitor.   Uncontrolled type 2 diabetes mellitus with hyperglycemia (HCC) -Blood sugars improved with starting lantus, denies hypoglycemia -Working on lifestyle/dietary changes.  Reviewed high carb foods and carb counting.

## 2018-12-24 ENCOUNTER — Ambulatory Visit (INDEPENDENT_AMBULATORY_CARE_PROVIDER_SITE_OTHER): Payer: Medicare HMO | Admitting: Family Medicine

## 2018-12-24 ENCOUNTER — Other Ambulatory Visit: Payer: Self-pay | Admitting: Family Medicine

## 2018-12-24 ENCOUNTER — Encounter: Payer: Self-pay | Admitting: Family Medicine

## 2018-12-24 ENCOUNTER — Telehealth: Payer: Self-pay | Admitting: Family Medicine

## 2018-12-24 VITALS — BP 148/82 | HR 73 | Temp 97.4°F | Ht 67.0 in | Wt 152.0 lb

## 2018-12-24 DIAGNOSIS — I10 Essential (primary) hypertension: Secondary | ICD-10-CM | POA: Diagnosis not present

## 2018-12-24 DIAGNOSIS — E785 Hyperlipidemia, unspecified: Secondary | ICD-10-CM

## 2018-12-24 DIAGNOSIS — N529 Male erectile dysfunction, unspecified: Secondary | ICD-10-CM | POA: Insufficient documentation

## 2018-12-24 DIAGNOSIS — E1165 Type 2 diabetes mellitus with hyperglycemia: Secondary | ICD-10-CM

## 2018-12-24 DIAGNOSIS — E1169 Type 2 diabetes mellitus with other specified complication: Secondary | ICD-10-CM | POA: Diagnosis not present

## 2018-12-24 DIAGNOSIS — N521 Erectile dysfunction due to diseases classified elsewhere: Secondary | ICD-10-CM | POA: Diagnosis not present

## 2018-12-24 MED ORDER — SILDENAFIL CITRATE 100 MG PO TABS: 50.0000 mg | ORAL_TABLET | Freq: Every day | ORAL | 3 refills | Status: DC | PRN

## 2018-12-24 NOTE — Assessment & Plan Note (Addendum)
-  New problem being addressed today.  -Trial of sildenafil -Adverse effects/side effects reviewed. Discussed avoidance of nitrates.

## 2018-12-24 NOTE — Assessment & Plan Note (Signed)
-  BP mildly elevated today -Recommend checking some at home as well.

## 2018-12-24 NOTE — Telephone Encounter (Signed)
Copied from [place] [phone]. Topic: Quick Communication - See Telephone Encounter >> Dec 24, 2018 12:07 PM Blase Mess A wrote: CRM for notification. See Telephone encounter for: 12/24/18.  Patient's girlfriend called in they where at appointment today with Dr. Zigmund Daniel. Advised would call in script for Cialis and Viagra. No script was sent to the pharmacy. Requesting a script for these two medications to be called in please. Thank you

## 2018-12-24 NOTE — Patient Instructions (Signed)
Keep up the good work! See me again in 2 months.  We'll update lab work at that time.     Diabetes Mellitus and Standards of Medical Care Managing diabetes (diabetes mellitus) can be complicated. Your diabetes treatment may be managed by a team of health care providers, including:  A physician who specializes in diabetes (endocrinologist).  A nurse practitioner or physician assistant.  Nurses.  A diet and nutrition specialist (registered dietitian).  A certified diabetes educator (CDE).  An exercise specialist.  A pharmacist.  An eye doctor.  A foot specialist (podiatrist).  A dentist.  A primary care provider.  A mental health provider. Your health care providers follow guidelines to help you get the best quality of care. The following schedule is a general guideline for your diabetes management plan. Your health care providers may give you more specific instructions. Physical exams Upon being diagnosed with diabetes mellitus, and each year after that, your health care provider will ask about your medical and family history. He or she will also do a physical exam. Your exam may include:  Measuring your height, weight, and body mass index (BMI).  Checking your blood pressure. This will be done at every routine medical visit. Your target blood pressure may vary depending on your medical conditions, your age, and other factors.  Thyroid gland exam.  Skin exam.  Screening for damage to your nerves (peripheral neuropathy). This may include checking the pulse in your legs and feet and checking the level of sensation in your hands and feet.  A complete foot exam to inspect the structure and skin of your feet, including checking for cuts, bruises, redness, blisters, sores, or other problems.  Screening for blood vessel (vascular) problems, which may include checking the pulse in your legs and feet and checking your temperature. Blood tests Depending on your treatment plan  and your personal needs, you may have the following tests done:  HbA1c (hemoglobin A1c). This test provides information about blood sugar (glucose) control over the previous 2-3 months. It is used to adjust your treatment plan, if needed. This test will be done: ? At least 2 times a year, if you are meeting your treatment goals. ? 4 times a year, if you are not meeting your treatment goals or if treatment goals have changed.  Lipid testing, including total, LDL, and HDL cholesterol and triglyceride levels. ? The goal for LDL is less than 100 mg/dL (5.5 mmol/L). If you are at high risk for complications, the goal is less than 70 mg/dL (3.9 mmol/L). ? The goal for HDL is 40 mg/dL (2.2 mmol/L) or higher for men and 50 mg/dL (2.8 mmol/L) or higher for women. An HDL cholesterol of 60 mg/dL (3.3 mmol/L) or higher gives some protection against heart disease. ? The goal for triglycerides is less than 150 mg/dL (8.3 mmol/L).  Liver function tests.  Kidney function tests.  Thyroid function tests. Dental and eye exams  Visit your dentist two times a year.  If you have type 1 diabetes, your health care provider may recommend an eye exam 3-5 years after you are diagnosed, and then once a year after your first exam. ? For children with type 1 diabetes, a health care provider may recommend an eye exam when your child is age 36 or older and has had diabetes for 3-5 years. After the first exam, your child should get an eye exam once a year.  If you have type 2 diabetes, your health care provider may  recommend an eye exam as soon as you are diagnosed, and then once a year after your first exam. Immunizations   The yearly flu (influenza) vaccine is recommended for everyone 6 months or older who has diabetes.  The pneumonia (pneumococcal) vaccine is recommended for everyone 2 years or older who has diabetes. If you are 65 or older, you may get the pneumonia vaccine as a series of two separate shots.  The  hepatitis B vaccine is recommended for adults shortly after being diagnosed with diabetes.  Adults and children with diabetes should receive all other vaccines according to age-specific recommendations from the Centers for Disease Control and Prevention (CDC). Mental and emotional health Screening for symptoms of eating disorders, anxiety, and depression is recommended at the time of diagnosis and afterward as needed. If your screening shows that you have symptoms (positive screening result), you may need more evaluation and you may work with a mental health care provider. Treatment plan Your treatment plan will be reviewed at every medical visit. You and your health care provider will discuss:  How you are taking your medicines, including insulin.  Any side effects you are experiencing.  Your blood glucose target goals.  The frequency of your blood glucose monitoring.  Lifestyle habits, such as activity level as well as tobacco, alcohol, and substance use. Diabetes self-management education Your health care provider will assess how well you are monitoring your blood glucose levels and whether you are taking your insulin correctly. He or she may refer you to:  A certified diabetes educator to manage your diabetes throughout your life, starting at diagnosis.  A registered dietitian who can create or review your personal nutrition plan.  An exercise specialist who can discuss your activity level and exercise plan. Summary  Managing diabetes (diabetes mellitus) can be complicated. Your diabetes treatment may be managed by a team of health care providers.  Your health care providers follow guidelines in order to help you get the best quality of care.  Standards of care including having regular physical exams, blood tests, blood pressure monitoring, immunizations, screening tests, and education about how to manage your diabetes.  Your health care providers may also give you more specific  instructions based on your individual health. This information is not intended to replace advice given to you by your health care provider. Make sure you discuss any questions you have with your health care provider. Document Released: 08/10/2009 Document Revised: 07/02/2018 Document Reviewed: 07/11/2016 Elsevier Interactive Patient Education  2019 [employer].

## 2018-12-24 NOTE — Addendum Note (Signed)
Addended by: Perlie Mayo on: 12/24/2018 01:35 PM   Modules accepted: Level of Service

## 2018-12-24 NOTE — Telephone Encounter (Signed)
completed

## 2018-12-24 NOTE — Assessment & Plan Note (Signed)
-  Doing well with atorvastatin, update lipids and LFT's at next f/u.

## 2018-12-24 NOTE — Assessment & Plan Note (Signed)
-  Glucose readings improved at home.  -Continue current insulin dosing -Continue to work on lifestyle change.  -F/u in 2 months, update a1c at that time.

## 2018-12-24 NOTE — Progress Notes (Addendum)
[name] - 68 y.o. male MRN [id number]  Date of birth: December 30, 1950  Subjective Chief Complaint  Patient presents with  . Follow-up    Doing well, BS maintaining    HPI [name] is a 68 y.o. male here today for follow up of diabetes and HTN.    -T2DM:  He reports that he is doing well with current medication.  No issues with insulin administration.  He denies symptoms of hypoglycemia.  Reports blood sugars have been around 120-130.  Frequent urination has resolved, he has gained some weight back and overall feels much better.  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    -HTN:  Currently taking lisinopril 10mg .  Doing well with this without side effects at this time.  Following low salt diet.  Denies chest pain, shortness of breath, palpitations, headache or vision changes.   -HLD:   Lab Results  Component Value Date   LDLCALC 156 (H) 11/10/2018  Tolerating atorvastatin without myalgias.     -ED:  Having issues with ED.  Would like to try medication to help with this.  Has never taken anything in the past.  Denies anginal symptoms.   ROS:  A comprehensive ROS was completed and negative except as noted per HPI  No Known Allergies  Past Medical History:  Diagnosis Date  . Diabetes mellitus without complication ([place])   . Insomnia     No past surgical history on file.  Social History   Socioeconomic History  . Marital status: Single    Spouse name: Not on file  . Number of children: Not on file  . Years of education: Not on file  . Highest education level: Not on file  Occupational History  . Not on file  Social Needs  . Financial resource strain: Not on file  . Food insecurity:    Worry: Not on file    Inability: Not on file  . Transportation needs:    Medical: Not on file    Non-medical: Not on file  Tobacco Use  . Smoking status: Never Smoker  . Smokeless tobacco: Never Used  Substance and Sexual Activity   . Alcohol use: No  . Drug use: No  . Sexual activity: Not on file  Lifestyle  . Physical activity:    Days per week: Not on file    Minutes per session: Not on file  . Stress: Not on file  Relationships  . Social connections:    Talks on phone: Not on file    Gets together: Not on file    Attends religious service: Not on file    Active member of club or organization: Not on file    Attends meetings of clubs or organizations: Not on file    Relationship status: Not on file  Other Topics Concern  . Not on file  Social History Narrative  . Not on file    Family History  Problem Relation Age of Onset  . Colon cancer Mother   . Diabetes Brother     Health Maintenance  Topic Date Due  . FOOT EXAM  06/09/1962  . OPHTHALMOLOGY EXAM  06/09/1962  . TETANUS/TDAP  06/10/1971  . COLONOSCOPY  06/09/2002  . PNA vac Low Risk Adult (1 of 2 - PCV13) 06/09/2017  . INFLUENZA VACCINE  01/25/2019 (Originally 05/27/2018)  . HEMOGLOBIN A1C  05/11/2019  . Hepatitis C Screening  Completed    ----------------------------------------------------------------------------------------------------------------------------------------------------------------------------------------------------------------- Physical Exam BP Marland Kitchen)  148/82   Pulse 73   Temp (!) 97.4 F (36.3 C) (Oral)   Ht 5\' 7"  (1.702 m)   Wt 152 lb (68.9 kg)   SpO2 93%   BMI 23.81 kg/m   Physical Exam Constitutional:      Appearance: Normal appearance.  HENT:     Head: Normocephalic and atraumatic.     Mouth/Throat:     Mouth: Mucous membranes are moist.  Eyes:     General: No scleral icterus. Neck:     Musculoskeletal: Neck supple.  Cardiovascular:     Rate and Rhythm: Normal rate and regular rhythm.  Pulmonary:     Effort: Pulmonary effort is normal.     Breath sounds: Normal breath sounds.  Skin:    General: Skin is warm and dry.  Neurological:     General: No focal deficit present.     Mental Status: He is  alert.  Psychiatric:        Mood and Affect: Mood normal.        Behavior: Behavior normal.     ------------------------------------------------------------------------------------------------------------------------------------------------------------------------------------------------------------------- Assessment and Plan  Hyperlipidemia associated with type 2 diabetes mellitus ([place]) -Doing well with atorvastatin, update lipids and LFT's at next f/u.  Uncontrolled type 2 diabetes mellitus with hyperglycemia ([facility]) -Glucose readings improved at home.  -Continue current insulin dosing -Continue to work on lifestyle change.  -F/u in 2 months, update a1c at that time.   Essential hypertension -BP mildly elevated today -Recommend checking some at home as well.   ED (erectile dysfunction) -New problem being addressed today.  -Trial of sildenafil -Adverse effects/side effects reviewed. Discussed avoidance of nitrates.

## 2019-01-28 ENCOUNTER — Telehealth: Payer: Self-pay | Admitting: Family Medicine

## 2019-01-28 ENCOUNTER — Other Ambulatory Visit: Payer: Self-pay

## 2019-01-28 DIAGNOSIS — R69 Illness, unspecified: Secondary | ICD-10-CM | POA: Diagnosis not present

## 2019-01-28 DIAGNOSIS — E1165 Type 2 diabetes mellitus with hyperglycemia: Secondary | ICD-10-CM

## 2019-01-28 DIAGNOSIS — E785 Hyperlipidemia, unspecified: Principal | ICD-10-CM

## 2019-01-28 DIAGNOSIS — I10 Essential (primary) hypertension: Secondary | ICD-10-CM

## 2019-01-28 DIAGNOSIS — E1169 Type 2 diabetes mellitus with other specified complication: Secondary | ICD-10-CM

## 2019-01-28 MED ORDER — ATORVASTATIN CALCIUM 40 MG PO TABS: 40.0000 mg | ORAL_TABLET | Freq: Every day | ORAL | 1 refills | Status: DC

## 2019-01-28 MED ORDER — BASAGLAR KWIKPEN 100 UNIT/ML ~~LOC~~ SOPN: 15.0000 [IU] | PEN_INJECTOR | Freq: Every day | SUBCUTANEOUS | 2 refills | Status: DC

## 2019-01-28 MED ORDER — LISINOPRIL 10 MG PO TABS: 10.0000 mg | ORAL_TABLET | Freq: Every day | ORAL | 1 refills | Status: DC

## 2019-01-28 MED ORDER — ONETOUCH DELICA LANCETS 33G MISC: 1 refills | Status: DC

## 2019-01-28 NOTE — Telephone Encounter (Signed)
Tried to call Pt S.O. , Silva Bandy to address her concerns about Pt appt. LAM  To RCB . Rx Refills on pending.

## 2019-01-28 NOTE — Telephone Encounter (Signed)
Copied from [place] [id number]. Topic: Quick Communication - Rx Refill/Question >> Jan 28, 2019  9:54 AM Celene Kras A wrote: Pts girlfriend called requesting medication refills. Pts girlfriend is also concerned about pts appt on 02/22/2019. Please contact girlfriend and advise at [phone].  Medication: atorvastatin (LIPITOR) 40 MG tablet, ONETOUCH DELICA LANCETS 45O MISC, ONE TOUCH ULTRA TEST test strip, Insulin Pen Needle (BD PEN NEEDLE NANO 2ND GEN) 32G X 4 MM MISC , Insulin Glargine (LANTUS SOLOSTAR) 100 UNIT/ML Solostar Pen, lisinopril (PRINIVIL,ZESTRIL) 10 MG tablet.  Has the patient contacted their pharmacy? Yes.   (Agent: If no, request that the patient contact the pharmacy for the refill.) (Agent: If yes, when and what did the pharmacy advise?)  Preferred Pharmacy (with phone number or street name): [id number] - [place], [place] [address] Phone: [phone] Fax: [phone] Not a 24 hour pharmacy; exact hours not known.    Agent: Please be advised that RX refills may take up to 3 business days. We ask that you follow-up with your pharmacy.

## 2019-01-31 ENCOUNTER — Telehealth: Payer: Self-pay | Admitting: Family Medicine

## 2019-01-31 ENCOUNTER — Other Ambulatory Visit: Payer: Self-pay

## 2019-01-31 DIAGNOSIS — E1165 Type 2 diabetes mellitus with hyperglycemia: Secondary | ICD-10-CM

## 2019-01-31 DIAGNOSIS — R69 Illness, unspecified: Secondary | ICD-10-CM | POA: Diagnosis not present

## 2019-01-31 MED ORDER — ONETOUCH ULTRA BLUE VI STRP: ORAL_STRIP | 1 refills | Status: DC

## 2019-01-31 MED ORDER — INSULIN PEN NEEDLE 32G X 4 MM MISC: 1 refills | Status: DC

## 2019-01-31 NOTE — Telephone Encounter (Signed)
These should have been sent, please check with pharmacy.

## 2019-01-31 NOTE — Telephone Encounter (Unsigned)
Copied from [place] [phone]. Topic: Quick Communication - Rx Refill/Question >> Jan 31, 2019  9:26 AM Yvette Rack wrote: Pt girlfriend Silva Bandy stated pt also needs the test strips and pen needles. Medication: ONE TOUCH ULTRA TEST test strip and Insulin Pen Needle (BD PEN NEEDLE NANO 2ND GEN) 32G X 4 MM MISC  Has the patient contacted their pharmacy? yes   Preferred Pharmacy (with phone number or street name): [id number] - [place], [place] [phone] (Phone)  [phone] (Fax)  Agent: Please be advised that RX refills may take up to 3 business days. We ask that you follow-up with your pharmacy.

## 2019-01-31 NOTE — Telephone Encounter (Signed)
Rx send for lancets and needles. Task completed.

## 2019-01-31 NOTE — Progress Notes (Signed)
Pt requested Refill. Other Rx Refill was completed.

## 2019-02-22 ENCOUNTER — Encounter: Payer: Self-pay | Admitting: Family Medicine

## 2019-02-22 ENCOUNTER — Ambulatory Visit (INDEPENDENT_AMBULATORY_CARE_PROVIDER_SITE_OTHER): Payer: Medicare HMO | Admitting: Family Medicine

## 2019-02-22 DIAGNOSIS — I10 Essential (primary) hypertension: Secondary | ICD-10-CM

## 2019-02-22 DIAGNOSIS — E1165 Type 2 diabetes mellitus with hyperglycemia: Secondary | ICD-10-CM | POA: Diagnosis not present

## 2019-02-22 NOTE — Assessment & Plan Note (Signed)
-  BP stable at home, continue current medication.

## 2019-02-22 NOTE — Assessment & Plan Note (Signed)
-  Glucose readings at home improved.  He will stop in for lab testing.  -Encouraged to continue to work on dietary/lifestyle changes.

## 2019-02-22 NOTE — Progress Notes (Signed)
[name] - 68 y.o. male MRN [id number]  Date of birth: 06-Apr-1951   This visit type was conducted due to national recommendations for restrictions regarding the COVID-19 Pandemic (e.g. social distancing).  This format is felt to be most appropriate for this patient at this time.  All issues noted in this document were discussed and addressed.  No physical exam was performed (except for noted visual exam findings with Video Visits).  I discussed the limitations of evaluation and management by telemedicine and the availability of in person appointments. The patient expressed understanding and agreed to proceed.  I connected with@ on 02/22/19 at  9:30 AM EDT by a video enabled telemedicine application and verified that I am speaking with the correct person using two identifiers.   Patient Location: [place] [address]   Provider location:   Home office  Chief Complaint  Patient presents with   Follow-up    2 mo, DM/HTN    HPI  [name] is a 68 y.o. male who presents via audio/video conferencing for a telehealth visit today.   He is following up today for DM and HTN.  -DM:  Blood sugars ranging from 117-150.  Doing well with current insulin dosing.  He denies any episodes of hypoglycemia.  He continues to remain fairly active and is doing better about watching diet.   -HTN: He reports that BP have been stable at home. He denies any symptoms related to hypertension including headache, vision change, chest pain, or shortness of breath.  He denies dizziness/lightheaded feeling.    ROS:  A comprehensive ROS was completed and negative except as noted per HPI  Past Medical History:  Diagnosis Date   Diabetes mellitus without complication ([place])    Insomnia     No past surgical history on file.  Family History  Problem Relation Age of Onset   Colon cancer Mother    Diabetes Brother     Social History   Socioeconomic History   Marital  status: Single    Spouse name: Not on file   Number of children: Not on file   Years of education: Not on file   Highest education level: Not on file  Occupational History   Not on file  Social Needs   Financial resource strain: Not on file   Food insecurity:    Worry: Not on file    Inability: Not on file   Transportation needs:    Medical: Not on file    Non-medical: Not on file  Tobacco Use   Smoking status: Never Smoker   Smokeless tobacco: Never Used  Substance and Sexual Activity   Alcohol use: No   Drug use: No   Sexual activity: Not on file  Lifestyle   Physical activity:    Days per week: Not on file    Minutes per session: Not on file   Stress: Not on file  Relationships   Social connections:    Talks on phone: Not on file    Gets together: Not on file    Attends religious service: Not on file    Active member of club or organization: Not on file    Attends meetings of clubs or organizations: Not on file    Relationship status: Not on file   Intimate partner violence:    Fear of current or ex partner: Not on file    Emotionally abused: Not on file    Physically abused: Not on file  Forced sexual activity: Not on file  Other Topics Concern   Not on file  Social History Narrative   Not on file     Current Outpatient Medications:    atorvastatin (LIPITOR) 40 MG tablet, Take 1 tablet (40 mg total) by mouth daily., Disp: 90 tablet, Rfl: 1   blood glucose meter kit and supplies, Dispense based on patient and insurance preference. Use to check glucose daily (FOR ICD-10 E10.9, E11.9)., Disp: 1 each, Rfl: 0   Insulin Glargine (BASAGLAR KWIKPEN) 100 UNIT/ML SOPN, Inject 0.15 mLs (15 Units total) into the skin daily., Disp: 5 mL, Rfl: 2   Insulin Pen Needle (BD PEN NEEDLE NANO 2ND GEN) 32G X 4 MM MISC, Use to inject insulin daily, Disp: 90 each, Rfl: 1   lisinopril (PRINIVIL,ZESTRIL) 10 MG tablet, Take 1 tablet (10 mg total) by mouth daily.,  Disp: 90 tablet, Rfl: 1   ONE TOUCH ULTRA TEST test strip, Use up to 4 times daily to check glucose levels daily, Disp: 100 each, Rfl: 1   OneTouch Delica Lancets 75Z MISC, Use up to 4x per day to check glucose daily E11.65, Disp: 100 each, Rfl: 1   sildenafil (VIAGRA) 100 MG tablet, Take 0.5-1 tablets (50-100 mg total) by mouth daily as needed for erectile dysfunction., Disp: 10 tablet, Rfl: 3  EXAM:  VITALS per patient if applicable: Ht 5' 6"  (1.676 m)    Wt 150 lb (68 kg) Comment: pt wt last wk @ hm   BMI 24.21 kg/m   GENERAL: alert, oriented, appears well and in no acute distress  HEENT: atraumatic, conjunttiva clear, no obvious abnormalities on inspection of external nose and ears  NECK: normal movements of the head and neck  LUNGS: on inspection no signs of respiratory distress, breathing rate appears normal, no obvious gross SOB, gasping or wheezing  CV: no obvious cyanosis  MS: moves all visible extremities without noticeable abnormality  PSYCH/NEURO: pleasant and cooperative, no obvious depression or anxiety, speech and thought processing grossly intact  ASSESSMENT AND PLAN:  Discussed the following assessment and plan:  Essential hypertension -BP stable at home, continue current medication.    Uncontrolled type 2 diabetes mellitus with hyperglycemia ([facility]) -Glucose readings at home improved.  He will stop in for lab testing.  -Encouraged to continue to work on dietary/lifestyle changes.        I discussed the assessment and treatment plan with the patient. The patient was provided an opportunity to ask questions and all were answered. The patient agreed with the plan and demonstrated an understanding of the instructions.   The patient was advised to call back or seek an in-person evaluation if the symptoms worsen or if the condition fails to improve as anticipated.     Luetta Nutting, DO

## 2019-03-08 ENCOUNTER — Other Ambulatory Visit (INDEPENDENT_AMBULATORY_CARE_PROVIDER_SITE_OTHER): Payer: Medicare HMO

## 2019-03-08 DIAGNOSIS — E1165 Type 2 diabetes mellitus with hyperglycemia: Secondary | ICD-10-CM

## 2019-03-08 LAB — BASIC METABOLIC PANEL
BUN: 23 mg/dL (ref 6–23)
CO2: 31 mEq/L (ref 19–32)
Calcium: 9.2 mg/dL (ref 8.4–10.5)
Chloride: 102 mEq/L (ref 96–112)
Creatinine, Ser: 1.24 mg/dL (ref 0.40–1.50)
GFR: 70.41 mL/min (ref >60.00)
Glucose, Bld: 146 mg/dL — ABNORMAL HIGH (ref 70–99)
Potassium: 4.4 mEq/L (ref 3.5–5.1)
Sodium: 140 mEq/L (ref 135–145)

## 2019-03-08 LAB — HEMOGLOBIN A1C: Hgb A1c MFr Bld: 7.8 % — ABNORMAL HIGH (ref 4.6–6.5)

## 2019-03-08 NOTE — Progress Notes (Signed)
Patient and lab staff wore masks for lab visit per COVID-19 protocol. -DMG

## 2019-03-09 NOTE — Progress Notes (Signed)
A1c improving, down to 7.8%.  Recommend continuation of current medications.

## 2019-03-10 NOTE — Progress Notes (Signed)
Called Pt again today . No answer. LAM of lab results.

## 2019-03-14 ENCOUNTER — Ambulatory Visit: Payer: Self-pay | Admitting: Family Medicine

## 2019-03-14 NOTE — Telephone Encounter (Signed)
I forgot to mention he wants a copy of his lab results mailed to him.   Thanks.

## 2019-03-14 NOTE — Telephone Encounter (Signed)
Mailed today

## 2019-03-14 NOTE — Telephone Encounter (Signed)
[name] girlfriend called in for lab results.   She is on the [employer].  I gave her the results from Dr. Luetta Nutting dated 03/09/2019 at 8:15 AM.  No questions from girlfriend.   She was glad of his progress.

## 2019-04-25 ENCOUNTER — Telehealth: Payer: Self-pay

## 2019-04-25 NOTE — Telephone Encounter (Signed)
Copied from [place] [id number]. Topic: General - Other >> Apr 13, 2019  7:19 AM Keene Breath wrote: Reason for CRM: Patient's partner called to request that the doctor call in something to treat the patient's neuropathy.  She stated that he is having pain in his hands and finger tips very bad where he could not sleep.  Please advise and let patient know what he can do to treat the pain.  CB# [phone] >> Apr 13, 2019  8:00 AM Jill Side wrote: Not our pt. Routing to correct provider.

## 2019-04-25 NOTE — Telephone Encounter (Signed)
Recommend virtual visit to discuss. 

## 2019-04-26 NOTE — Telephone Encounter (Signed)
Called Pt S.O to schedule. LVM with office number to call back to schedule.

## 2019-04-27 NOTE — Telephone Encounter (Signed)
Called S.O. Pt is scheduled for MyChart VV 04/28/2019 @ 11:30. Task completed.

## 2019-04-28 ENCOUNTER — Telehealth: Payer: Medicare HMO | Admitting: Family Medicine

## 2019-06-28 ENCOUNTER — Telehealth: Payer: Self-pay

## 2019-06-28 DIAGNOSIS — E1165 Type 2 diabetes mellitus with hyperglycemia: Secondary | ICD-10-CM

## 2019-06-28 DIAGNOSIS — R69 Illness, unspecified: Secondary | ICD-10-CM | POA: Diagnosis not present

## 2019-06-28 NOTE — Telephone Encounter (Signed)
Okay to enter referral?  Copied from [place] [id number]. Topic: Referral - Request for Referral >> Jun 28, 2019 10:47 AM Yvette Rack wrote: Has patient seen PCP for this complaint? yes  *If NO, is insurance requiring patient see PCP for this issue before PCP can refer them? Referral for which specialty: Endocrinology Preferred provider/office: Dr. [facility] phone# [phone] Reason for referral: monitor diabetes Pt spouse [name] requests call back. Cb# [phone]

## 2019-06-28 NOTE — Telephone Encounter (Signed)
Spoke with pt spouse to make aware of referral being placed.

## 2019-06-28 NOTE — Telephone Encounter (Signed)
Ok to place referral.

## 2019-07-01 ENCOUNTER — Other Ambulatory Visit: Payer: Self-pay

## 2019-07-05 NOTE — Progress Notes (Signed)
Name: [name]  MRN/ DOB: [id number], 05-13-1951   Age/ Sex: 68 y.o., male    PCP: Luetta Nutting, DO   Reason for Endocrinology Evaluation: Type 2 Diabetes Mellitus     Date of Initial Endocrinology Visit: 07/06/2019     PATIENT IDENTIFIER: [name] is a 68 y.o. male with a past medical history of HTN, Dyslipidemia and HTN. The patient presented for initial endocrinology clinic visit on 07/06/2019 for consultative assistance with his diabetes management.    HPI: [name] is here with his girlfriend    Diagnosed with DM in 2017 Prior Medications tried/Intolerance: Metformin- Nausea/ Dizziness  Currently checking blood sugars 1 x / day,  before breakfast and   Hypoglycemia episodes :no               Hemoglobin A1c has ranged from 7.8% in 02/2019, peaking at 11.8% in 10/2018. Patient required assistance for hypoglycemia: no Patient has required hospitalization within the last 1 year from hyper or hypoglycemia: no  In terms of diet, the patient eats 1-2 meals, eats snacks all the time. Avoids sugar-sweetened beverages    HOME DIABETES REGIMEN: Basaglar 14 units daily     Statin: Yes ACE-I/ARB: yes Prior Diabetic Education: yes   METER DOWNLOAD SUMMARY: Date range evaluated: 8/27-07/06/2019 Fingerstick Blood Glucose Tests = 9 Average Number Tests/Day = 0.6 Overall Mean FS Glucose = 312   BG Ranges: Low = 160 High = 312   Hypoglycemic Events/30 Days: BG < 50 = 0 Episodes of symptomatic severe hypoglycemia = 0   DIABETIC COMPLICATIONS: Microvascular complications:   Neuropathy   Denies: CKD, ? retinopathy  Last eye exam: Completed "never"   Macrovascular complications:    Denies: CAD, PVD, CVA   PAST HISTORY: Past Medical History:  Past Medical History:  Diagnosis Date  . Diabetes mellitus without complication ([place])   . Insomnia     Past Surgical History: History reviewed. No pertinent surgical history.   Social History:  reports  that he has never smoked. He has never used smokeless tobacco. He reports that he does not drink alcohol or use drugs. Family History:  Family History  Problem Relation Age of Onset  . Colon cancer Mother   . Diabetes Brother      HOME MEDICATIONS: Allergies as of 07/06/2019   No Known Allergies     Medication List       Accurate as of July 06, 2019  7:46 AM. If you have any questions, ask your nurse or doctor.        atorvastatin 40 MG tablet Commonly known as: LIPITOR Take 1 tablet (40 mg total) by mouth daily.   Basaglar KwikPen 100 UNIT/ML Sopn Inject 0.15 mLs (15 Units total) into the skin daily. What changed: how much to take   blood glucose meter kit and supplies Dispense based on patient and insurance preference. Use to check glucose daily (FOR ICD-10 E10.9, E11.9).   Insulin Pen Needle 32G X 4 MM Misc Commonly known as: BD Pen Needle Nano 2nd Gen Use to inject insulin daily   lisinopril 10 MG tablet Commonly known as: ZESTRIL Take 1 tablet (10 mg total) by mouth daily.   ONE TOUCH ULTRA TEST test strip Generic drug: glucose blood Use up to 4 times daily to check glucose levels daily   OneTouch Delica Lancets 57S Misc Use up to 4x per day to check glucose daily E11.65   sildenafil 100 MG tablet Commonly known as: Viagra Take  0.5-1 tablets (50-100 mg total) by mouth daily as needed for erectile dysfunction.        ALLERGIES: No Known Allergies   REVIEW OF SYSTEMS: A comprehensive ROS was conducted with the patient and is negative except as per HPI and below:  Review of Systems  Constitutional: Negative for chills and fever.  HENT: Negative for congestion and sore throat.   Eyes: Negative for blurred vision and pain.  Respiratory: Negative for cough and shortness of breath.   Cardiovascular: Negative for chest pain and palpitations.  Gastrointestinal: Negative for diarrhea and nausea.  Genitourinary: Negative for frequency.  Skin: Negative.    Neurological: Positive for tingling. Negative for tremors.  Endo/Heme/Allergies: Negative for polydipsia.  Psychiatric/Behavioral: Negative for depression. The patient is not nervous/anxious.       OBJECTIVE:   VITAL SIGNS: BP (!) 142/82 (BP Location: Left Arm, Patient Position: Sitting, Cuff Size: Normal)   Pulse 88   Temp 97.8 F (36.6 C)   Ht 5' 6"  (1.676 m)   Wt 149 lb 3.2 oz (67.7 kg)   SpO2 98%   BMI 24.08 kg/m    PHYSICAL EXAM:  General: Pt appears well and is in NAD  Hydration: Well-hydrated with moist mucous membranes and good skin turgor  HEENT: Head: Unremarkable with good dentition. Oropharynx clear without exudate.  Eyes: External eye exam normal without stare, lid lag or exophthalmos.  EOM intact.  PERRL.  Neck: General: Supple without adenopathy or carotid bruits. Thyroid: Thyroid size normal.  No goiter or nodules appreciated. No thyroid bruit.  Lungs: Clear with good BS bilat with no rales, rhonchi, or wheezes  Heart: RRR with normal S1 and S2 and no gallops; no murmurs; no rub  Abdomen: Normoactive bowel sounds, soft, nontender, without masses or organomegaly palpable  Extremities:  Lower extremities - No pretibial edema. No lesions.  Skin: Normal texture and temperature to palpation. No rash noted. No Acanthosis nigricans/skin tags. No lipohypertrophy.  Neuro: MS is good with appropriate affect, pt is alert and Ox3    DM foot exam: 07/06/2019  The skin of the feet is intact without sores or ulcerations. The pedal pulses are 2+ on right and 2+ on left. The sensation is decreased to a screening 5.07, 10 gram monofilament bilaterally   DATA REVIEWED:  Lab Results  Component Value Date   HGBA1C 8.0 (A) 07/06/2019   HGBA1C 7.8 (H) 03/08/2019   HGBA1C 11.8 (H) 11/10/2018   Lab Results  Component Value Date   MICROALBUR 13.6 (H) 11/10/2018   LDLCALC 156 (H) 11/10/2018   CREATININE 1.24 03/08/2019   Lab Results  Component Value Date   MICRALBCREAT  7.1 11/10/2018    Lab Results  Component Value Date   CHOL 225 (H) 11/10/2018   HDL 44.50 11/10/2018   LDLCALC 156 (H) 11/10/2018   TRIG 123.0 11/10/2018   CHOLHDL 5 11/10/2018        ASSESSMENT / PLAN / RECOMMENDATIONS:   1) Type 2 Diabetes Mellitus, Sub-Optimally controlled, With Neuropathic complications - Most recent A1c of 7.8 %. Goal A1c < 7.0 %.      -I  have discussed with the patient the pathophysiology of diabetes. We went over the natural progression of the disease. We talked about both insulin resistance and insulin deficiency. We stressed the importance of lifestyle changes including diet and exercise. I explained the complications associated with diabetes including retinopathy, nephropathy, neuropathy as well as increased risk of cardiovascular disease. We went over the benefit seen  with glycemic control.  I explained to the patient that diabetic patients are at higher than normal risk for amputations.   - In review of his meter download, pt has fluctuating BG's between 160-500 mg/dL.   - Discussed the importance of lifestyle changes in diabetes control, he declined a CDE referral today.   - Will switch basal insulin to an insulin mix, I emphasized the importance of taking it with a meal .  -He is intolerant to Metformin    MEDICATIONS: - STOP Basaglar  - Start Humalog MIX ( Or Novolog Mix, which ever your company covers) 12 units with Breakfast and 12 units with Supper   EDUCATION / INSTRUCTIONS:  BG monitoring instructions: Patient is instructed to check his blood sugars 2 times a day, before breakfast and supper .  Call [facility] if: BG persistently < 70 or > 300. . I reviewed the Rule of 15 for the treatment of hypoglycemia in detail with the patient. Literature supplied.   2) Diabetic complications:   Eye: Unknown to have known diabetic retinopathy. Pt urged to see an ophthalmologist  Neuro/ Feet: Does have known diabetic peripheral  neuropathy.  Renal: Patient does not have known baseline CKD. He is on an ACEI/ARB at present.  3) Lipids: Patient is  on a statin. He was started on this in January, 2020. Discussed the cardioprotective benefits of statins   4) Hypertension: He is above  goal of < 140/90 mmHg.     F/u in 8 weeks   Signed electronically by: Mack Guise, MD  [facility] Endocrinology  [facility] [address] [place], [place] [address] Phone: [phone] FAX: [phone]   CC: Luetta Nutting, [place] [address] Phone: [phone]  Fax: [phone]    Return to Endocrinology clinic as below: No future appointments.

## 2019-07-06 ENCOUNTER — Telehealth: Payer: Self-pay | Admitting: Internal Medicine

## 2019-07-06 ENCOUNTER — Other Ambulatory Visit: Payer: Self-pay

## 2019-07-06 ENCOUNTER — Ambulatory Visit: Payer: Medicare HMO | Admitting: Internal Medicine

## 2019-07-06 ENCOUNTER — Encounter: Payer: Self-pay | Admitting: Internal Medicine

## 2019-07-06 VITALS — BP 142/82 | HR 88 | Temp 97.8°F | Ht 66.0 in | Wt 149.2 lb

## 2019-07-06 DIAGNOSIS — E1142 Type 2 diabetes mellitus with diabetic polyneuropathy: Secondary | ICD-10-CM | POA: Diagnosis not present

## 2019-07-06 DIAGNOSIS — E785 Hyperlipidemia, unspecified: Secondary | ICD-10-CM | POA: Diagnosis not present

## 2019-07-06 DIAGNOSIS — E1165 Type 2 diabetes mellitus with hyperglycemia: Secondary | ICD-10-CM

## 2019-07-06 DIAGNOSIS — R69 Illness, unspecified: Secondary | ICD-10-CM | POA: Diagnosis not present

## 2019-07-06 LAB — POCT GLYCOSYLATED HEMOGLOBIN (HGB A1C): Hemoglobin A1C: 8 % — AB (ref 4.0–5.6)

## 2019-07-06 MED ORDER — ONETOUCH DELICA LANCETS 30G MISC: 1.0000 | 3 refills | Status: DC

## 2019-07-06 MED ORDER — NOVOLIN 70/30 FLEXPEN (70-30) 100 UNIT/ML ~~LOC~~ SUPN: 12.0000 [IU] | PEN_INJECTOR | Freq: Two times a day (BID) | SUBCUTANEOUS | 11 refills | Status: DC

## 2019-07-06 MED ORDER — BD PEN NEEDLE NANO 2ND GEN 32G X 4 MM MISC: 1 refills | Status: DC

## 2019-07-06 MED ORDER — INSULIN LISPRO PROT & LISPRO (75-25 MIX) 100 UNIT/ML KWIKPEN: 12.0000 [IU] | PEN_INJECTOR | Freq: Two times a day (BID) | SUBCUTANEOUS | 4 refills | Status: DC

## 2019-07-06 MED ORDER — INSULIN PEN NEEDLE 32G X 4 MM MISC: 1.0000 | Freq: Two times a day (BID) | 3 refills | Status: DC

## 2019-07-06 MED ORDER — ONETOUCH DELICA LANCING DEV MISC: 1.0000 | 0 refills | Status: AC

## 2019-07-06 NOTE — Telephone Encounter (Signed)
Spoke to pharmacist Mitzi Hansen and he stated that insurance would not cover humalog so changed to novolog.

## 2019-07-06 NOTE — Patient Instructions (Addendum)
-   STOP Basaglar  - Start Humalog MIX ( Or Novolog Mix, which ever your company covers) 12 units with Breakfast and 12 units with Supper   - Check sugar before Breakfast and Supper when you can    - Call our office should your sugars drop below 70 or remain above 250 .    - HOW TO TREAT LOW BLOOD SUGARS (Blood sugar LESS THAN 70 MG/DL)  Please follow the RULE OF 15 for the treatment of hypoglycemia treatment (when your (blood sugars are less than 70 mg/dL)    STEP 1: Take 15 grams of carbohydrates when your blood sugar is low, which includes:   3-4 GLUCOSE TABS  OR  3-4 OZ OF JUICE OR REGULAR SODA OR  ONE TUBE OF GLUCOSE GEL     STEP 2: RECHECK blood sugar in 15 MINUTES STEP 3: If your blood sugar is still low at the 15 minute recheck --> then, go back to STEP 1 and treat AGAIN with another 15 grams of carbohydrates.

## 2019-07-06 NOTE — Telephone Encounter (Signed)
Please call [employer] (ONE TOUCH DELICA LANCING DEV) M[facility] . [facility] not [facility] for the lancing device.

## 2019-07-12 DIAGNOSIS — R69 Illness, unspecified: Secondary | ICD-10-CM | POA: Diagnosis not present

## 2019-08-03 ENCOUNTER — Other Ambulatory Visit: Payer: Self-pay | Admitting: Family Medicine

## 2019-08-03 DIAGNOSIS — E1165 Type 2 diabetes mellitus with hyperglycemia: Secondary | ICD-10-CM

## 2019-08-04 ENCOUNTER — Telehealth: Payer: Self-pay

## 2019-08-04 DIAGNOSIS — R69 Illness, unspecified: Secondary | ICD-10-CM | POA: Diagnosis not present

## 2019-08-04 DIAGNOSIS — E1165 Type 2 diabetes mellitus with hyperglycemia: Secondary | ICD-10-CM

## 2019-08-04 MED ORDER — ONETOUCH ULTRA VI STRP: ORAL_STRIP | 1 refills | Status: DC

## 2019-08-04 MED ORDER — ONETOUCH DELICA LANCETS 30G MISC: 1.0000 | 1 refills | Status: DC

## 2019-08-04 NOTE — Telephone Encounter (Signed)
Copied from [place] [id number]. Topic: General - Inquiry >> Aug 03, 2019  4:00 PM Richardo Priest, [place] wrote: Reason for CRM: Pharmacy called in stating patient is now wanting to have a 90 day supply for his test strips as well as lancets that were recently sent in to pharmacy. Please advise.

## 2019-08-05 DIAGNOSIS — R69 Illness, unspecified: Secondary | ICD-10-CM | POA: Diagnosis not present

## 2019-08-29 ENCOUNTER — Other Ambulatory Visit: Payer: Self-pay

## 2019-08-31 ENCOUNTER — Encounter: Payer: Self-pay | Admitting: Internal Medicine

## 2019-08-31 ENCOUNTER — Telehealth: Payer: Self-pay | Admitting: *Deleted

## 2019-08-31 ENCOUNTER — Other Ambulatory Visit: Payer: Self-pay

## 2019-08-31 ENCOUNTER — Ambulatory Visit: Payer: Medicare HMO | Admitting: Internal Medicine

## 2019-08-31 ENCOUNTER — Ambulatory Visit (INDEPENDENT_AMBULATORY_CARE_PROVIDER_SITE_OTHER): Payer: Medicare HMO | Admitting: Internal Medicine

## 2019-08-31 VITALS — BP 160/80 | HR 96 | Ht 66.0 in | Wt 151.4 lb

## 2019-08-31 DIAGNOSIS — E1142 Type 2 diabetes mellitus with diabetic polyneuropathy: Secondary | ICD-10-CM | POA: Diagnosis not present

## 2019-08-31 DIAGNOSIS — E1165 Type 2 diabetes mellitus with hyperglycemia: Secondary | ICD-10-CM | POA: Diagnosis not present

## 2019-08-31 MED ORDER — FREESTYLE LIBRE 14 DAY SENSOR MISC: 1.0000 | 11 refills | Status: DC

## 2019-08-31 MED ORDER — NOVOLIN 70/30 FLEXPEN (70-30) 100 UNIT/ML ~~LOC~~ SUPN: 16.0000 [IU] | PEN_INJECTOR | Freq: Two times a day (BID) | SUBCUTANEOUS | 11 refills | Status: DC

## 2019-08-31 MED ORDER — FREESTYLE LIBRE 14 DAY READER DEVI: 1.0000 | 0 refills | Status: DC

## 2019-08-31 NOTE — Telephone Encounter (Signed)
Dr. Quin Hoop message she sent back to me was:  started him on insulin which I am not aware of it causing gas issues. He needs to reach out to his PCP    Message text

## 2019-08-31 NOTE — Progress Notes (Signed)
Name: [name]  Age/ Sex: 68 y.o., male   MRN/ DOB: [id number], 1951/02/14     PCP: Luetta Nutting, DO   Reason for Endocrinology Evaluation: Type 2 Diabetes Mellitus  Initial Endocrine Consultative Visit: 07/06/2019    PATIENT IDENTIFIER: [name] is a 68 y.o. male with a past medical history of HTN, Dyslipidemia and HTN . The patient has followed with Endocrinology clinic since 07/06/2019 for consultative assistance with management of his diabetes.  DIABETIC HISTORY:  [name] was diagnosed with T2DM in 2017. Metformin has caused nausea and dizziness.Has been on inulin for a while now. His hemoglobin A1c has ranged from 7.8% in 02/2019, peaking at 11.8% in 10/2018  On his initial visit to our clinic he was on basaglar only  With an A1c 7.8%. This was switxhed to a humalog mix   SUBJECTIVE:   During the last visit (07/06/2019): A1c 7.8% We stopped basaglar and started Humalog Mix   Today (08/31/2019): Mr. Cosby is here for a 2 month follow up on diabetes management. He is accompanied by his girlfriend [place].  He checks his blood sugars 1times daily. The patient has not had hypoglycemic episodes since the last clinic visit. Otherwise, the patient has not required any recent emergency interventions for hypoglycemia and has not had recent hospitalizations secondary to hyper or hypoglycemic episodes.  Pt and girlfriend are satisfied with the current insulin regimen, stating with the previous regimen he felt dizzy and unsteady but since being on this regimen he has been stable.   ROS: As per HPI and as detailed below: Review of Systems  Constitutional: Negative for chills and fever.  Respiratory: Positive for cough. Negative for shortness of breath.   Cardiovascular: Negative for chest pain and palpitations.  Gastrointestinal: Negative for nausea and vomiting.      HOME DIABETES REGIMEN:  Novolin Mix 12 units BID    METER DOWNLOAD SUMMARY: Date range evaluated:  08/18/2019 Fingerstick Blood Glucose Tests = 9 Average Number Tests/Day = 0.6 Overall Mean FS Glucose = 275   BG Ranges: Low = 213 High = 419   Hypoglycemic Events/30 Days: BG < 50 = 0 Episodes of symptomatic severe hypoglycemia = 0     HISTORY:  Past Medical History:  Past Medical History:  Diagnosis Date  . Diabetes mellitus without complication ([place])   . Insomnia     Past Surgical History: History reviewed. No pertinent surgical history.  Social History:  reports that he has never smoked. He has never used smokeless tobacco. He reports that he does not drink alcohol or use drugs. Family History:  Family History  Problem Relation Age of Onset  . Colon cancer Mother   . Diabetes Brother      HOME MEDICATIONS: Allergies as of 08/31/2019   No Known Allergies     Medication List       Accurate as of August 31, 2019  7:47 AM. If you have any questions, ask your nurse or doctor.        atorvastatin 40 MG tablet Commonly known as: LIPITOR Take 1 tablet (40 mg total) by mouth daily.   blood glucose meter kit and supplies Dispense based on patient and insurance preference. Use to check glucose daily (FOR ICD-10 E10.9, E11.9).   Insulin Pen Needle 32G X 4 MM Misc 1 Device by Does not apply route 2 (two) times daily with a meal.   BD Pen Needle Nano 2nd Gen 32G X 4 MM Misc Generic drug:  Insulin Pen Needle Use to inject insulin daily   lisinopril 10 MG tablet Commonly known as: ZESTRIL Take 1 tablet (10 mg total) by mouth daily.   NovoLIN 70/30 FlexPen (70-30) 100 UNIT/ML PEN Generic drug: Insulin Isophane & Regular Human Inject 16 Units into the skin 2 (two) times daily with a meal. What changed: how much to take Changed by: Dorita Sciara, MD   ONE TOUCH DELICA LANCING DEV Misc 1 Device by Does not apply route as directed.   OneTouch Delica Lancets 68E Misc 1 Device by Does not apply route as directed. Use to test up to 4x daily.   OneTouch  Ultra test strip Generic drug: glucose blood USE UP TO 4X DAILY   sildenafil 100 MG tablet Commonly known as: Viagra Take 0.5-1 tablets (50-100 mg total) by mouth daily as needed for erectile dysfunction.        OBJECTIVE:   Vital Signs: BP (!) 160/80 (BP Location: Left Arm, Patient Position: Sitting, Cuff Size: Normal)   Pulse 96   Ht _0  (1.676 m)   Wt 151 lb 6.4 oz (68.7 kg)   SpO2 97%   BMI 24.44 kg/m   Wt Readings from Last 3 Encounters:  08/31/19 151 lb 6.4 oz (68.7 kg)  07/06/19 149 lb 3.2 oz (67.7 kg)  02/22/19 150 lb (68 kg)     Exam: General: Pt appears well and is in NAD  Lungs: Clear with good BS bilat with no rales, rhonchi, or wheezes  Heart: RRR with normal S1 and S2 and no gallops; no murmurs; no rub  Abdomen: Normoactive bowel sounds, soft, nontender, without masses or organomegaly palpable  Extremities: No pretibial edema. No tremor.  Neuro: MS is good with appropriate affect, pt is alert and Ox3     DM foot exam: 07/06/2019  The skin of the feet is intact without sores or ulcerations. The pedal pulses are 2+ on right and 2+ on left. The sensation is decreased to a screening 5.07, 10 gram monofilament bilaterally    DATA REVIEWED:  Lab Results  Component Value Date   HGBA1C 8.0 (A) 07/06/2019   HGBA1C 7.8 (H) 03/08/2019   HGBA1C 11.8 (H) 11/10/2018   Lab Results  Component Value Date   MICROALBUR 13.6 (H) 11/10/2018   LDLCALC 156 (H) 11/10/2018   CREATININE 1.24 03/08/2019   Lab Results  Component Value Date   MICRALBCREAT 7.1 11/10/2018     Lab Results  Component Value Date   CHOL 225 (H) 11/10/2018   HDL 44.50 11/10/2018   LDLCALC 156 (H) 11/10/2018   TRIG 123.0 11/10/2018   CHOLHDL 5 11/10/2018         ASSESSMENT / PLAN / RECOMMENDATIONS:   1) Type 2 Diabetes Mellitus, Sub-Optimally controlled, With Neuropathic complications - Most recent A1c of 7.8 %. Goal A1c < 7.0 %.    - Praised the patient on medication  compliance.  - His BG's are consistently high > 200's. Girlfriend is unable to obtain finger sticks due to severe callous formation over the finger tips , will obtain a prescription for CGM. They were asked to call [facility] and establish an account.  - Will increase insulin dose as below   -He is intolerant to Metformin   Plan: MEDICATIONS:  Humalog Mix 16 units BID  EDUCATION / INSTRUCTIONS:  BG monitoring instructions: Patient is instructed to check his blood sugars 2-4 times a day, before meals.  Call [place] [facility] clinic if: BG persistently < 70  or > 300. . I reviewed the Rule of 15 for the treatment of hypoglycemia in detail with the patient. Literature supplied.       F/U in 3 months    Signed electronically by: Mack Guise, MD  [facility] Endocrinology  [facility] [address] [place], [place] [address] Phone: [phone] FAX: [phone]   CC: Luetta Nutting, [place] [address] Phone: [phone]  Fax: [phone]  Return to Endocrinology clinic as below: No future appointments.

## 2019-08-31 NOTE — Telephone Encounter (Signed)
Called pt to discuss getting set up with DME store to get the [place] wanted me to relay a message to Dr. Kelton Pillar. She said pt has been having bad gas since starting on new med. They have tried all OTC gas meds with no releif. Silva Bandy wanted to let Dr. Kelton Pillar know and also ask what they can do about his severe gas.  [place] # [phone]

## 2019-08-31 NOTE — Telephone Encounter (Signed)
Called [name] and left VM letting her know Dr. Quin Hoop comments and to f/u with PCP regarding gas issue

## 2019-08-31 NOTE — Patient Instructions (Addendum)
-   Increase Novolin Mix to 16 units with Breakfast and 16 units with Supper   Choose healthy, lower carb lower calorie snacks: toss salad, cooked vegetables, cottage cheese, peanut butter, low fat cheese / string cheese, lower sodium deli meat, tuna salad or chicken salad     - HOW TO TREAT LOW BLOOD SUGARS (Blood sugar LESS THAN 70 MG/DL)  Please follow the RULE OF 15 for the treatment of hypoglycemia treatment (when your (blood sugars are less than 70 mg/dL)    STEP 1: Take 15 grams of carbohydrates when your blood sugar is low, which includes:   3-4 GLUCOSE TABS  OR  3-4 OZ OF JUICE OR REGULAR SODA OR  ONE TUBE OF GLUCOSE GEL     STEP 2: RECHECK blood sugar in 15 MINUTES STEP 3: If your blood sugar is still low at the 15 minute recheck --> then, go back to STEP 1 and treat AGAIN with another 15 grams of carbohydrates.

## 2019-09-03 ENCOUNTER — Other Ambulatory Visit: Payer: Self-pay | Admitting: Family Medicine

## 2019-09-03 DIAGNOSIS — I10 Essential (primary) hypertension: Secondary | ICD-10-CM

## 2019-09-27 ENCOUNTER — Other Ambulatory Visit: Payer: Self-pay

## 2019-09-27 ENCOUNTER — Telehealth: Payer: Self-pay

## 2019-09-27 MED ORDER — FREESTYLE LIBRE 14 DAY SENSOR MISC: 1.0000 | 11 refills | Status: DC

## 2019-09-27 MED ORDER — FREESTYLE LIBRE 14 DAY READER DEVI: 1.0000 | 0 refills | Status: DC

## 2019-09-27 NOTE — Telephone Encounter (Signed)
Spoke to [name] for clarification, [name] needs rx and last OV notes faxed so that can send pt freestyle libre. OV notes printed and rx placed on Dr. Kelton Pillar desk for signature

## 2019-09-27 NOTE — Telephone Encounter (Signed)
patiens wife called in wanting to know about his meter and paper work. Patient states company hasn't got paper work back from Dr office    Please call and advise

## 2019-10-04 ENCOUNTER — Telehealth: Payer: Self-pay | Admitting: Family Medicine

## 2019-10-04 NOTE — Telephone Encounter (Signed)
Ok with me 

## 2019-10-04 NOTE — Telephone Encounter (Signed)
Both provider please advise.     Copied from [place] [id number]. Topic: Appointment Scheduling - Transfer of Care >> Oct 04, 2019  9:59 AM Rainey P[place] A wrote: Pt is requesting to transfer FROM: DR. Luetta Nutting Pt is requesting to transfer TO: Dr. Grier Mitts  Reason for requested transfer: Office is closer to home

## 2019-10-04 NOTE — Telephone Encounter (Signed)
Unfortunately, as of yesterday 10/03/19, this provider is no longer accepting new pts or TOC.

## 2019-10-05 NOTE — Telephone Encounter (Signed)
LVM for the pt toc all back, need to inform message below and see if he has another provider in mind on that location?

## 2019-10-05 NOTE — Telephone Encounter (Signed)
Pt is aware, he going to call back with another provider.

## 2019-10-10 ENCOUNTER — Telehealth: Payer: Self-pay

## 2019-10-10 NOTE — Telephone Encounter (Signed)
Patient girlfriend called in needing a PA for meter that records the sugar that's attached to the back of the arm.  [employer] t  Please call and advise

## 2019-10-10 NOTE — Telephone Encounter (Signed)
Spoke to [name] and she sated that [employer] informed her that pt would have to pay for [place] and then be reimbursed, she stated that she then contacted [facility] and they informed her that all pt needed was a PA sent in for the system. I will initiate a PA on CoverMymeds today for this.

## 2019-10-10 NOTE — Telephone Encounter (Signed)
Lft vm informing Silva Bandy that paperwork was faxed to [place] on 09/27/2019 regarding Freestyle Elenor Legato and that she can reach out to them @ [phone], but if she had any other issues to return call to office

## 2019-10-12 NOTE — Telephone Encounter (Signed)
Received a fax from [place] and it informed me that request for freestyle Elenor Legato could not be processed, called # provided on fax for follow up and was told that it needed to be faxed directly to DME supplier. Will choose a different supplier since pt was not happy with [facility].

## 2019-11-10 ENCOUNTER — Telehealth: Payer: Self-pay

## 2019-11-10 NOTE — Telephone Encounter (Signed)
Spoke to pt girlfriend, Silva Bandy, and had appt moved from 12/02/2019 to 11/25/2019 to complete [facility] medical paperwork.

## 2019-11-10 NOTE — Telephone Encounter (Signed)
Patients girlfriend called in asking can nurse call her before faxing papers back to medical supply. She is stating that they need Rx for meter free style 2, ICD code, last two OV notes, patient has to be injecting 3x for insurance to cover    Please call and advise

## 2019-11-18 ENCOUNTER — Telehealth: Payer: Self-pay | Admitting: Internal Medicine

## 2019-11-18 NOTE — Telephone Encounter (Signed)
Informed April  from [place] that pt has upcoming appt on 11/25/2019 and will update paperwork at that time if pt is checking the required amount of times per day.

## 2019-11-18 NOTE — Telephone Encounter (Signed)
[name] called from [employer].  He is calling to follow up on information regarding CMN for CGM.  Needs to verify blood test per day and insulin injections per day

## 2019-11-22 ENCOUNTER — Telehealth: Payer: Self-pay

## 2019-11-22 NOTE — Telephone Encounter (Signed)
Pt has appt. Friday morning, would you like me to get him in sooner?

## 2019-11-22 NOTE — Telephone Encounter (Signed)
Spoke to pt girlfriend and informed her, she stated that she would contact the [place] location to try to find a new PCP for the pt and that she would make sure that he keeps his appointment Friday and that he has his meter with him.

## 2019-11-22 NOTE — Telephone Encounter (Signed)
Patient checked blood sugar before breakfast this morning and it was 200 and checked it again before lunch and it was 95- [name] (em contact) is concerned he has lost weight seems to be getting confused, and sleepy all the time-please contact her to get more information at [phone]

## 2019-11-23 ENCOUNTER — Other Ambulatory Visit: Payer: Self-pay

## 2019-11-25 ENCOUNTER — Ambulatory Visit (INDEPENDENT_AMBULATORY_CARE_PROVIDER_SITE_OTHER): Payer: Medicare HMO | Admitting: Internal Medicine

## 2019-11-25 ENCOUNTER — Other Ambulatory Visit: Payer: Self-pay

## 2019-11-25 ENCOUNTER — Encounter: Payer: Self-pay | Admitting: Internal Medicine

## 2019-11-25 VITALS — BP 158/98 | HR 86 | Temp 97.9°F | Ht 66.0 in | Wt 157.6 lb

## 2019-11-25 DIAGNOSIS — E1142 Type 2 diabetes mellitus with diabetic polyneuropathy: Secondary | ICD-10-CM | POA: Diagnosis not present

## 2019-11-25 DIAGNOSIS — I10 Essential (primary) hypertension: Secondary | ICD-10-CM

## 2019-11-25 LAB — POCT GLYCOSYLATED HEMOGLOBIN (HGB A1C): Hemoglobin A1C: 7 % — AB (ref 4.0–5.6)

## 2019-11-25 MED ORDER — LISINOPRIL 20 MG PO TABS: 20.0000 mg | ORAL_TABLET | Freq: Every day | ORAL | 3 refills | Status: DC

## 2019-11-25 NOTE — Progress Notes (Signed)
Name: [name]  Age/ Sex: 69 y.o., male   MRN/ DOB: [id number], 04/27/1951     PCP: Luetta Nutting, DO   Reason for Endocrinology Evaluation: Type 2 Diabetes Mellitus  Initial Endocrine Consultative Visit: 07/06/2019    PATIENT IDENTIFIER: [name] is a 69 y.o. male with a past medical history of HTN, Dyslipidemia and HTN . The patient has followed with Endocrinology clinic since 07/06/2019 for consultative assistance with management of his diabetes.  DIABETIC HISTORY:  [name] was diagnosed with T2DM in 2017. Metformin has caused nausea and dizziness.Has been on inulin for a while now. His hemoglobin A1c has ranged from 7.8% in 02/2019, peaking at 11.8% in 10/2018  On his initial visit to our clinic he was on basaglar only  With an A1c 7.8%. This was switxhed to a humalog mix   SUBJECTIVE:   During the last visit (08/31/2019): A1c 7.8 % We increased  Humalog Mix   Today (11/25/2019): [name] is here for a 3 month follow up on diabetes management. He is accompanied by his girlfriend [place].  He checks his blood sugars 2 times daily for the past 3 months. The patient has had hypoglycemic episodes since the last clinic visit (BG 20 mg/dL) , he does not recall it nor does he recall having symptoms. Otherwise, the patient has not required any recent emergency interventions for hypoglycemia and has not had recent hospitalizations secondary to hyper or hypoglycemic episodes.    He has been feeling tired. Silva Bandy states lisinopril has caused cough but the pt states his cough has resolved.  Silva Bandy has been getting him sugar -free cranberry juice   ROS: As per HPI and as detailed below: Review of Systems  Constitutional: Negative for chills and fever.  Respiratory: Negative for cough and shortness of breath.   Cardiovascular: Negative for chest pain and palpitations.  Gastrointestinal: Negative for nausea and vomiting.      HOME DIABETES REGIMEN:  Novolin  Mix 16  units BID - Has been taking insulin for the past 4 months    METER DOWNLOAD SUMMARY: Date range evaluated:1/16-1/29/2021 Fingerstick Blood Glucose Tests = 29 Average Number Tests/Day = 2.1 Overall Mean FS Glucose = 179   BG Ranges: Low = 20 High = 282   Hypoglycemic Events/30 Days: BG < 50 = 1 Episodes of symptomatic severe hypoglycemia = 0     HISTORY:  Past Medical History:  Past Medical History:  Diagnosis Date  . Diabetes mellitus without complication ([place])   . Insomnia     Past Surgical History: No past surgical history on file.  Social History:  reports that he has never smoked. He has never used smokeless tobacco. He reports that he does not drink alcohol or use drugs. Family History:  Family History  Problem Relation Age of Onset  . Colon cancer Mother   . Diabetes Brother      HOME MEDICATIONS: Allergies as of 11/25/2019   No Known Allergies     Medication List       Accurate as of November 25, 2019  7:31 AM. If you have any questions, ask your nurse or doctor.        atorvastatin 40 MG tablet Commonly known as: LIPITOR Take 1 tablet (40 mg total) by mouth daily.   blood glucose meter kit and supplies Dispense based on patient and insurance preference. Use to check glucose daily (FOR ICD-10 E10.9, E11.9).   FreeStyle Libre 14 Day Reader Kerrin Mo 1 Device  by Does not apply route as directed.   FreeStyle Libre 14 Day Sensor Misc 1 Device by Does not apply route as directed.   Insulin Pen Needle 32G X 4 MM Misc 1 Device by Does not apply route 2 (two) times daily with a meal.   BD Pen Needle Nano 2nd Gen 32G X 4 MM Misc Generic drug: Insulin Pen Needle Use to inject insulin daily   lisinopril 10 MG tablet Commonly known as: ZESTRIL TAKE 1 TABLET BY MOUTH EVERY DAY   NovoLIN 70/30 FlexPen (70-30) 100 UNIT/ML PEN Generic drug: Insulin Isophane & Regular Human Inject 16 Units into the skin 2 (two) times daily with a meal.   ONE TOUCH DELICA  LANCING DEV Misc 1 Device by Does not apply route as directed.   OneTouch Delica Lancets 29U Misc 1 Device by Does not apply route as directed. Use to test up to 4x daily.   OneTouch Ultra test strip Generic drug: glucose blood USE UP TO 4X DAILY   sildenafil 100 MG tablet Commonly known as: Viagra Take 0.5-1 tablets (50-100 mg total) by mouth daily as needed for erectile dysfunction.        OBJECTIVE:   Vital Signs: There were no vitals taken for this visit.  Wt Readings from Last 3 Encounters:  08/31/19 151 lb 6.4 oz (68.7 kg)  07/06/19 149 lb 3.2 oz (67.7 kg)  02/22/19 150 lb (68 kg)     Exam: General: Pt appears well and is in NAD  Lungs: Clear with good BS bilat with no rales, rhonchi, or wheezes  Heart: RRR with normal S1 and S2 and no gallops; no murmurs; no rub  Abdomen: Normoactive bowel sounds, soft, nontender, without masses or organomegaly palpable  Extremities: No pretibial edema. No tremor.  Neuro: MS is good with appropriate affect, pt is alert and Ox3     DM foot exam: 07/06/2019  The skin of the feet is intact without sores or ulcerations. The pedal pulses are 2+ on right and 2+ on left. The sensation is decreased to a screening 5.07, 10 gram monofilament bilaterally    DATA REVIEWED:  Lab Results  Component Value Date   HGBA1C 8.0 (A) 07/06/2019   HGBA1C 7.8 (H) 03/08/2019   HGBA1C 11.8 (H) 11/10/2018   Lab Results  Component Value Date   MICROALBUR 13.6 (H) 11/10/2018   LDLCALC 156 (H) 11/10/2018   CREATININE 1.24 03/08/2019   Lab Results  Component Value Date   MICRALBCREAT 7.1 11/10/2018     Lab Results  Component Value Date   CHOL 225 (H) 11/10/2018   HDL 44.50 11/10/2018   LDLCALC 156 (H) 11/10/2018   TRIG 123.0 11/10/2018   CHOLHDL 5 11/10/2018         ASSESSMENT / PLAN / RECOMMENDATIONS:   1) Type 2 Diabetes Mellitus, Optimally controlled, With Neuropathic complications - Most recent A1c of 7.0 %. Goal A1c < 7.0 %.     - Praised the patient on optimal glucose control, he continues with fluctuating Bg's . His meter recorded a BG of 20 mg/dL but pt does not recall that and believes this was an error, recheck immediately after was 259 mg/dL, so I am not sure what to make out of this.  - His BG's are consistently high > 200's in the morning but tight in the afternoon, will adjust insulin as below . - Girlfriend is unable to obtain finger sticks due to severe callous formation over the finger tips ,  will obtain a prescription for CGM.   - They were asked to check on sugar-free products and check CHO content and to limit below 15 grams per drinks.   -He is intolerant to Metformin   Plan: MEDICATIONS:  Novolin Mix (70/30) 14 units Before Breakfast and 18 units before supper   EDUCATION / INSTRUCTIONS:  BG monitoring instructions: Patient is instructed to check his blood sugars 2 times a day, before meals.  Call [place] [facility] clinic if: BG persistently < 70 or > 300. . I reviewed the Rule of 15 for the treatment of hypoglycemia in detail with the patient. Literature supplied.  2.Hypertension:   - Out of control today, he is establishing with a new PCP in April, 2021. Wil increase Lisinopril in the meantime to 20 mg . Girlfriend states pt has cough because of lisinopril, but the patient states his cough has resolved.  - Discussed other D/D of dry cough such as asthma, copd and post nasal drip as well as GERD  F/U in 4 months    Signed electronically by: Mack Guise, MD  [facility] Endocrinology  [facility] [address] [place], [place] [address] Phone: [phone] FAX: [phone]   CC: Luetta Nutting, [place] [address] Phone: [phone]  Fax: [phone]  Return to Endocrinology clinic as below: Future Appointments  Date Time Provider [place]  01/27/2020  7:30 AM [place], Betty G, MD [facility]

## 2019-11-25 NOTE — Patient Instructions (Addendum)
-    Novolin Mix 14 units Before Breakfast and 18 units before Supper - Increase Lisinopril to 20 mg daily   - Check sugar Before Breakfast and Supper when you can      - HOW TO TREAT LOW BLOOD SUGARS (Blood sugar LESS THAN 70 MG/DL)  Please follow the RULE OF 15 for the treatment of hypoglycemia treatment (when your (blood sugars are less than 70 mg/dL)    STEP 1: Take 15 grams of carbohydrates when your blood sugar is low, which includes:   3-4 GLUCOSE TABS  OR  3-4 OZ OF JUICE OR REGULAR SODA OR  ONE TUBE OF GLUCOSE GEL     STEP 2: RECHECK blood sugar in 15 MINUTES STEP 3: If your blood sugar is still low at the 15 minute recheck --> then, go back to STEP 1 and treat AGAIN with another 15 grams of carbohydrates.

## 2019-12-02 ENCOUNTER — Ambulatory Visit: Payer: Medicare HMO | Admitting: Internal Medicine

## 2020-01-16 DIAGNOSIS — Z833 Family history of diabetes mellitus: Secondary | ICD-10-CM | POA: Diagnosis not present

## 2020-01-16 DIAGNOSIS — Z8249 Family history of ischemic heart disease and other diseases of the circulatory system: Secondary | ICD-10-CM | POA: Diagnosis not present

## 2020-01-16 DIAGNOSIS — E785 Hyperlipidemia, unspecified: Secondary | ICD-10-CM | POA: Diagnosis not present

## 2020-01-16 DIAGNOSIS — E1136 Type 2 diabetes mellitus with diabetic cataract: Secondary | ICD-10-CM | POA: Diagnosis not present

## 2020-01-16 DIAGNOSIS — E1165 Type 2 diabetes mellitus with hyperglycemia: Secondary | ICD-10-CM | POA: Diagnosis not present

## 2020-01-16 DIAGNOSIS — I10 Essential (primary) hypertension: Secondary | ICD-10-CM | POA: Diagnosis not present

## 2020-01-16 DIAGNOSIS — K219 Gastro-esophageal reflux disease without esophagitis: Secondary | ICD-10-CM | POA: Diagnosis not present

## 2020-01-16 DIAGNOSIS — Z794 Long term (current) use of insulin: Secondary | ICD-10-CM | POA: Diagnosis not present

## 2020-01-27 ENCOUNTER — Ambulatory Visit: Payer: Medicare HMO | Admitting: Family Medicine

## 2020-02-05 ENCOUNTER — Other Ambulatory Visit: Payer: Self-pay | Admitting: Family Medicine

## 2020-02-05 DIAGNOSIS — E1169 Type 2 diabetes mellitus with other specified complication: Secondary | ICD-10-CM

## 2020-02-05 DIAGNOSIS — E785 Hyperlipidemia, unspecified: Secondary | ICD-10-CM

## 2020-02-17 ENCOUNTER — Ambulatory Visit: Payer: Medicare HMO | Admitting: Family Medicine

## 2020-02-21 ENCOUNTER — Ambulatory Visit (INDEPENDENT_AMBULATORY_CARE_PROVIDER_SITE_OTHER): Payer: Medicare HMO | Admitting: Family Medicine

## 2020-02-21 ENCOUNTER — Encounter: Payer: Self-pay | Admitting: Family Medicine

## 2020-02-21 ENCOUNTER — Other Ambulatory Visit: Payer: Self-pay

## 2020-02-21 VITALS — BP 190/90 | HR 82 | Temp 97.6°F | Resp 12 | Ht 66.0 in | Wt 156.5 lb

## 2020-02-21 DIAGNOSIS — Z23 Encounter for immunization: Secondary | ICD-10-CM

## 2020-02-21 DIAGNOSIS — R972 Elevated prostate specific antigen [PSA]: Secondary | ICD-10-CM

## 2020-02-21 DIAGNOSIS — I10 Essential (primary) hypertension: Secondary | ICD-10-CM | POA: Diagnosis not present

## 2020-02-21 DIAGNOSIS — Z1211 Encounter for screening for malignant neoplasm of colon: Secondary | ICD-10-CM

## 2020-02-21 DIAGNOSIS — N401 Enlarged prostate with lower urinary tract symptoms: Secondary | ICD-10-CM | POA: Diagnosis not present

## 2020-02-21 DIAGNOSIS — R059 Cough, unspecified: Secondary | ICD-10-CM

## 2020-02-21 DIAGNOSIS — E1169 Type 2 diabetes mellitus with other specified complication: Secondary | ICD-10-CM | POA: Diagnosis not present

## 2020-02-21 DIAGNOSIS — E785 Hyperlipidemia, unspecified: Secondary | ICD-10-CM | POA: Diagnosis not present

## 2020-02-21 DIAGNOSIS — R05 Cough: Secondary | ICD-10-CM | POA: Diagnosis not present

## 2020-02-21 DIAGNOSIS — R351 Nocturia: Secondary | ICD-10-CM

## 2020-02-21 LAB — LIPID PANEL
Cholesterol: 101 mg/dL (ref 0–200)
HDL: 34.1 mg/dL — ABNORMAL LOW (ref >39.00)
LDL Cholesterol: 55 mg/dL (ref 0–99)
NonHDL: 66.91
Total CHOL/HDL Ratio: 3
Triglycerides: 58 mg/dL (ref 0.0–149.0)
VLDL: 11.6 mg/dL (ref 0.0–40.0)

## 2020-02-21 LAB — URINALYSIS, ROUTINE W REFLEX MICROSCOPIC
Bilirubin Urine: NEGATIVE
Ketones, ur: NEGATIVE
Leukocytes,Ua: NEGATIVE
Nitrite: NEGATIVE
Specific Gravity, Urine: 1.025 (ref 1.000–1.030)
Total Protein, Urine: 100 — AB
Urine Glucose: NEGATIVE
Urobilinogen, UA: 0.2 (ref 0.0–1.0)
pH: 6 (ref 5.0–8.0)

## 2020-02-21 LAB — COMPREHENSIVE METABOLIC PANEL
ALT: 29 U/L (ref 0–53)
AST: 30 U/L (ref 0–37)
Albumin: 3.8 g/dL (ref 3.5–5.2)
Alkaline Phosphatase: 110 U/L (ref 39–117)
BUN: 28 mg/dL — ABNORMAL HIGH (ref 6–23)
CO2: 34 mEq/L — ABNORMAL HIGH (ref 19–32)
Calcium: 9.1 mg/dL (ref 8.4–10.5)
Chloride: 103 mEq/L (ref 96–112)
Creatinine, Ser: 1.27 mg/dL (ref 0.40–1.50)
GFR: 68.3 mL/min (ref >60.00)
Glucose, Bld: 209 mg/dL — ABNORMAL HIGH (ref 70–99)
Potassium: 4.8 mEq/L (ref 3.5–5.1)
Sodium: 143 mEq/L (ref 135–145)
Total Bilirubin: 0.4 mg/dL (ref 0.2–1.2)
Total Protein: 6.3 g/dL (ref 6.0–8.3)

## 2020-02-21 LAB — PSA: PSA: 4.99 ng/mL — ABNORMAL HIGH (ref 0.10–4.00)

## 2020-02-21 MED ORDER — LOSARTAN POTASSIUM 50 MG PO TABS: 50.0000 mg | ORAL_TABLET | Freq: Every day | ORAL | 2 refills | Status: DC

## 2020-02-21 MED ORDER — AMLODIPINE BESYLATE 2.5 MG PO TABS: 2.5000 mg | ORAL_TABLET | Freq: Every day | ORAL | 2 refills | Status: DC

## 2020-02-21 NOTE — Assessment & Plan Note (Signed)
Continue atorvastatin 40 mg daily and low fat diet. Further recommendations according to lipid panel results.

## 2020-02-21 NOTE — Progress Notes (Signed)
HPI:  [name] is a 69 y.o. male, who is here with his girlfriend today to establish care.  Former PCP: Dr Einar Pheasant Last preventive routine visit: 12/2015.  Chronic medical problems: Hypertension, hyperlipidemia, DM 2, and ED among some.  According to his girlfriend, he has had cough for about a year. It seems to be worse at night. No associated wheezing or dyspnea. No hx of tobacco use. He has not identified exacerbating or alleviating factors. Negative for heartburn and acid reflux. CXR done on 11/01/18 was negative.  His girlfriend thinks it is Lisinopril.  Hypertension: Today BP is elevated, initially 200/100. She is reporting "good" BP readings at home. Currently he is on lisinopril 20 mg daily, which was adjusted in 10/2019. Denies severe/frequent headache, visual changes, chest pain, palpitation, focal weakness, or edema.  Today I heard a mild hear murmur, report prior Hx.  Lab Results  Component Value Date   CREATININE 1.24 03/08/2019   BUN 23 03/08/2019   NA 140 03/08/2019   K 4.4 03/08/2019   CL 102 03/08/2019   CO2 31 03/08/2019   HLD: he is on atorvastatin 40 mg daily.  Lab Results  Component Value Date   CHOL 225 (H) 11/10/2018   HDL 44.50 11/10/2018   LDLCALC 156 (H) 11/10/2018   TRIG 123.0 11/10/2018   CHOLHDL 5 11/10/2018   His girlfriend is concerned about not having PSA check in the past. Nocturia x 3 for the past 4 months. Denies difficulty to start urination,gross hematuria,and decreased urine output.  He has not had colon cancer screening. Mother with colon cancer at age 2. He has not noted changes in bowel habits or blood in stool.  Review of Systems  Constitutional: Negative for activity change, appetite change, fatigue and fever.  HENT: Negative for mouth sores, nosebleeds, sore throat and trouble swallowing.   Gastrointestinal: Negative for abdominal pain, nausea and vomiting.  Genitourinary: Positive for frequency.  Negative for dysuria.  Musculoskeletal: Positive for arthralgias (Hand OA). Negative for gait problem.  Skin: Negative for rash and wound.  Neurological: Negative for syncope, facial asymmetry and weakness.  Psychiatric/Behavioral: Positive for sleep disturbance. Negative for confusion.  Rest see pertinent positives and negatives per HPI.  Current Outpatient Medications on File Prior to Visit  Medication Sig Dispense Refill  . atorvastatin (LIPITOR) 40 MG tablet Take 1 tablet (40 mg total) by mouth daily. 90 tablet 1  . blood glucose meter kit and supplies Dispense based on patient and insurance preference. Use to check glucose daily (FOR ICD-10 E10.9, E11.9). 1 each 0  . glucose blood (ONETOUCH ULTRA) test strip USE UP TO 4X DAILY 400 strip 1  . Insulin Isophane & Regular Human (NOVOLIN 70/30 FLEXPEN) (70-30) 100 UNIT/ML PEN Inject 16 Units into the skin 2 (two) times daily with a meal. 15 mL 11  . Insulin Pen Needle (BD PEN NEEDLE NANO 2ND GEN) 32G X 4 MM MISC Use to inject insulin daily 90 each 1  . Insulin Pen Needle 32G X 4 MM MISC 1 Device by Does not apply route 2 (two) times daily with a meal. 100 each 3  . Lancet Devices (ONE TOUCH DELICA LANCING DEV) MISC 1 Device by Does not apply route as directed. 1 each 0  . OneTouch Delica Lancets 21H MISC 1 Device by Does not apply route as directed. Use to test up to 4x daily. 400 each 1  . sildenafil (VIAGRA) 100 MG tablet Take 0.5-1 tablets (50-100  mg total) by mouth daily as needed for erectile dysfunction. 10 tablet 3  . Continuous Blood Gluc Receiver (FREESTYLE LIBRE 14 DAY READER) DEVI 1 Device by Does not apply route as directed. (Patient not taking: Reported on 11/25/2019) 1 each 0  . Continuous Blood Gluc Sensor (FREESTYLE LIBRE 14 DAY SENSOR) MISC 1 Device by Does not apply route as directed. (Patient not taking: Reported on 11/25/2019) 2 each 11   No current facility-administered medications on file prior to visit.   Past Medical  History:  Diagnosis Date  . Diabetes mellitus without complication ([place])   . Insomnia    No Known Allergies  Family History  Problem Relation Age of Onset  . Colon cancer Mother   . Diabetes Brother     Social History   Socioeconomic History  . Marital status: Single    Spouse name: Not on file  . Number of children: Not on file  . Years of education: Not on file  . Highest education level: Not on file  Occupational History  . Not on file  Tobacco Use  . Smoking status: Never Smoker  . Smokeless tobacco: Never Used  Substance and Sexual Activity  . Alcohol use: No  . Drug use: No  . Sexual activity: Not on file  Other Topics Concern  . Not on file  Social History Narrative  . Not on file   Social Determinants of Health   Financial Resource Strain:   . Difficulty of Paying Living Expenses:   Food Insecurity:   . Worried About Charity fundraiser in the Last Year:   . Arboriculturist in the Last Year:   Transportation Needs:   . Film/video editor (Medical):   Marland Kitchen Lack of Transportation (Non-Medical):   Physical Activity:   . Days of Exercise per Week:   . Minutes of Exercise per Session:   Stress:   . Feeling of Stress :   Social Connections:   . Frequency of Communication with Friends and Family:   . Frequency of Social Gatherings with Friends and Family:   . Attends Religious Services:   . Active Member of Clubs or Organizations:   . Attends Archivist Meetings:   Marland Kitchen Marital Status:     Vitals:   02/21/20 0715  BP: (!) 190/90  Pulse: 82  Resp: 12  Temp: 97.6 F (36.4 C)  SpO2: 97%   Body mass index is 25.26 kg/m.  Physical Exam  Nursing note reviewed. Constitutional: He is oriented to person, place, and time. He appears well-developed. No distress.  HENT:  Head: Normocephalic and atraumatic.  Mouth/Throat: Oropharynx is clear and moist and mucous membranes are normal. He has dentures.  Eyes: Pupils are equal, round, and reactive  to light. Conjunctivae are normal.  Cardiovascular: Normal rate and regular rhythm.  Murmur (SEM I/VI RUSB and LUSB) heard. Pulses:      Posterior tibial pulses are 2+ on the right side and 2+ on the left side.  Respiratory: Effort normal and breath sounds normal. No respiratory distress.  GI: Soft. He exhibits no mass. There is no hepatomegaly. There is no abdominal tenderness.  Genitourinary: Prostate is enlarged. Prostate is not tender.  Musculoskeletal:        General: No edema.     Comments: Heberden's node and Bouchard's nodes. No signs of synovitis.  Lymphadenopathy:    He has no cervical adenopathy.  Neurological: He is alert and oriented to person, place, and time.  He has normal strength. No cranial nerve deficit. Gait normal.  Skin: Skin is warm. No rash noted. No erythema.  Psychiatric: He has a normal mood and affect. Cognition and memory are normal.  Well groomed, good eye contact.   ASSESSMENT AND PLAN:  Mr.Draken was seen today for establish care.  Diagnoses and all orders for this visit: Orders Placed This Encounter  Procedures  . Pneumococcal polysaccharide vaccine 23-valent greater than or equal to 2yo subcutaneous/IM  . Comprehensive metabolic panel  . Lipid panel  . PSA  . Urinalysis, Routine w reflex microscopic  . Ambulatory referral to Gastroenterology   Lab Results  Component Value Date   CHOL 101 02/21/2020   HDL 34.10 (L) 02/21/2020   LDLCALC 55 02/21/2020   TRIG 58.0 02/21/2020   CHOLHDL 3 02/21/2020   Lab Results  Component Value Date   PSA 4.99 (H) 02/21/2020   PSA 2.69 01/22/2016   Lab Results  Component Value Date   ALT 29 02/21/2020   AST 30 02/21/2020   ALKPHOS 110 02/21/2020   BILITOT 0.4 02/21/2020    Lab Results  Component Value Date   CREATININE 1.27 02/21/2020   BUN 28 (H) 02/21/2020   NA 143 02/21/2020   K 4.8 02/21/2020   CL 103 02/21/2020   CO2 34 (H) 02/21/2020    BPH associated with nocturia Educated about  diagnosis. Further recommendations in regard to treatment will be discussed after PSA and UA results are back.  Essential hypertension BP is not adequately controlled. Lisinopril discontinued, most likely contributing to chronic cough. Losartan 50 mg and amlodipine 2.5 mg added today, the latter one recommend taking at bedtime. Continue monitoring BP regularly, educated about appropriate technique. He is overdue for eye exam. Continue low-salt diet. Follow-up in 6 weeks.  Hyperlipidemia associated with type 2 diabetes mellitus (HCC) Continue atorvastatin 40 mg daily and low fat diet. Further recommendations according to lipid panel results.  Colon cancer screening -     Ambulatory referral to Gastroenterology  Cough We discussed possible etiologies. He was instructed to stop lisinopril. I do not think imaging needs to be repeated today. If cough is persistent, we will need to consider other possible causes.  Need for pneumococcal vaccination -     Pneumococcal polysaccharide vaccine 23-valent greater than or equal to 2yo subcutaneous/IM   Return in about 6 weeks (around 04/03/2020) for HTN and cpe.   Margarette Vannatter G. [place], MD  [facility]. [place] office.   A few things to remember from today's visit:   Today we stopped Lisinopril because cough. Losartan 50 mg added to take AM. Amlodipine 2.5 mg to take at bedtime. Continue monitoring blood pressure. Bring blood pressure monitor next visit. Gastro appt will be arranged to talk about colonoscopy.  We will talk about prostate symptoms treatment after lab results are back.   Please be sure medication list is accurate. If a new problem present, please set up appointment sooner than planned today.

## 2020-02-21 NOTE — Assessment & Plan Note (Signed)
Educated about diagnosis. Further recommendations in regard to treatment will be discussed after PSA and UA results are back.

## 2020-02-21 NOTE — Patient Instructions (Signed)
A few things to remember from today's visit:   Today we stopped Lisinopril because cough. Losartan 50 mg added to take AM. Amlodipine 2.5 mg to take at bedtime. Continue monitoring blood pressure. Bring blood pressure monitor next visit. Gastro appt will be arranged to talk about colonoscopy.  We will talk about prostate symptoms treatment after lab results are back.   Please be sure medication list is accurate. If a new problem present, please set up appointment sooner than planned today.

## 2020-02-21 NOTE — Assessment & Plan Note (Signed)
BP is not adequately controlled. Lisinopril discontinued, most likely contributing to chronic cough. Losartan 50 mg and amlodipine 2.5 mg added today, the latter one recommend taking at bedtime. Continue monitoring BP regularly, educated about appropriate technique. He is overdue for eye exam. Continue low-salt diet. Follow-up in 6 weeks.

## 2020-02-22 ENCOUNTER — Encounter: Payer: Self-pay | Admitting: Gastroenterology

## 2020-02-24 ENCOUNTER — Telehealth: Payer: Self-pay | Admitting: Family Medicine

## 2020-02-24 NOTE — Telephone Encounter (Signed)
Please advise 

## 2020-02-24 NOTE — Telephone Encounter (Signed)
Mrs. Tamala Julian called in regards to medication that was supposed to be called into the pharamcy for the patients prostate level being to high.   She said that he didn't need a refill on his   amLODipine (NORVASC) 2.5 MG tablet  losartan (COZAAR) 50 MG tablet  She is at the pharmacy now and needs for the prostate medication to be called in today.  Please advise

## 2020-02-27 ENCOUNTER — Other Ambulatory Visit: Payer: Self-pay | Admitting: Family Medicine

## 2020-02-27 DIAGNOSIS — I10 Essential (primary) hypertension: Secondary | ICD-10-CM

## 2020-02-27 MED ORDER — AMLODIPINE BESYLATE 2.5 MG PO TABS: 2.5000 mg | ORAL_TABLET | Freq: Every day | ORAL | 0 refills | Status: DC

## 2020-02-27 MED ORDER — SULFAMETHOXAZOLE-TRIMETHOPRIM 800-160 MG PO TABS: 1.0000 | ORAL_TABLET | Freq: Two times a day (BID) | ORAL | 0 refills | Status: AC

## 2020-02-27 MED ORDER — LOSARTAN POTASSIUM 50 MG PO TABS: 50.0000 mg | ORAL_TABLET | Freq: Every day | ORAL | 2 refills | Status: DC

## 2020-02-27 NOTE — Telephone Encounter (Signed)
These medications were added last visit, prescriptions were sent the same day. I just sent losartan 49-month supply, hopefully we would not have to adjust medication. Please remind him to follow as recommended. Thanks, BJ

## 2020-02-27 NOTE — Telephone Encounter (Signed)
Noted  

## 2020-02-27 NOTE — Telephone Encounter (Signed)
Advised Mrs.Smilth of previous message from [place], she said understood message.

## 2020-03-19 ENCOUNTER — Encounter: Payer: Medicare HMO | Admitting: Gastroenterology

## 2020-03-20 ENCOUNTER — Other Ambulatory Visit: Payer: Self-pay

## 2020-03-21 ENCOUNTER — Other Ambulatory Visit (INDEPENDENT_AMBULATORY_CARE_PROVIDER_SITE_OTHER): Payer: Medicare HMO

## 2020-03-21 DIAGNOSIS — R972 Elevated prostate specific antigen [PSA]: Secondary | ICD-10-CM | POA: Diagnosis not present

## 2020-03-21 LAB — PSA: PSA: 6.49 ng/mL — ABNORMAL HIGH (ref 0.10–4.00)

## 2020-03-22 ENCOUNTER — Other Ambulatory Visit: Payer: Self-pay

## 2020-03-22 ENCOUNTER — Encounter: Payer: Self-pay | Admitting: Internal Medicine

## 2020-03-22 ENCOUNTER — Ambulatory Visit (INDEPENDENT_AMBULATORY_CARE_PROVIDER_SITE_OTHER): Payer: Medicare HMO | Admitting: Internal Medicine

## 2020-03-22 VITALS — BP 164/84 | HR 87 | Temp 97.8°F | Ht 66.0 in | Wt 152.2 lb

## 2020-03-22 DIAGNOSIS — E1142 Type 2 diabetes mellitus with diabetic polyneuropathy: Secondary | ICD-10-CM | POA: Diagnosis not present

## 2020-03-22 LAB — POCT GLYCOSYLATED HEMOGLOBIN (HGB A1C): Hemoglobin A1C: 7 % — AB (ref 4.0–5.6)

## 2020-03-22 MED ORDER — DEXCOM G6 TRANSMITTER MISC: 1.0000 | 3 refills | Status: DC

## 2020-03-22 MED ORDER — NOVOLIN 70/30 FLEXPEN (70-30) 100 UNIT/ML ~~LOC~~ SUPN: PEN_INJECTOR | SUBCUTANEOUS | 11 refills | Status: DC

## 2020-03-22 MED ORDER — DEXCOM G6 RECEIVER DEVI: 1.0000 | 0 refills | Status: DC

## 2020-03-22 MED ORDER — DEXCOM G6 SENSOR MISC: 1.0000 | 3 refills | Status: DC

## 2020-03-22 NOTE — Progress Notes (Signed)
Name: [name]  Age/ Sex: 69 y.o., male   MRN/ DOB: [id number], 03/20/1951     PCP: Martinique, Betty G, MD   Reason for Endocrinology Evaluation: Type 2 Diabetes Mellitus  Initial Endocrine Consultative Visit: 07/06/2019    PATIENT IDENTIFIER: [name] is a 69 y.o. male with a past medical history of HTN, Dyslipidemia and HTN . The patient has followed with Endocrinology clinic since 07/06/2019 for consultative assistance with management of his diabetes.  DIABETIC HISTORY:  [name] was diagnosed with T2DM in 2017. Metformin has caused nausea and dizziness.Has been on inulin for a while now. His hemoglobin A1c has ranged from 7.8% in 02/2019, peaking at 11.8% in 10/2018  On his initial visit to our clinic he was on basaglar only  With an A1c 7.8%. This was switxhed to a humalog mix   SUBJECTIVE:   During the last visit (11/25/2019): A1c 7. % We adjusted Humalog Mix   Today (03/22/2020): [name] is here for a 3 month follow up on diabetes management. He is accompanied by his girlfriend [place].  He checks his blood sugars 1 times daily for the past. The patient has had hypoglycemic episodes since the last clinic visit but not in the past month.      HOME DIABETES REGIMEN:  Novolin  Mix 14 units QAM and 18 units QPM- takes 18 units BID     METER DOWNLOAD SUMMARY: Date range evaluated 5/14-5/27/2021 Fingerstick Blood Glucose Tests = 7 Average Number Tests/Day = 0.5 Overall Mean FS Glucose = 208   BG Ranges: Low = 152 High = 267   Hypoglycemic Events/30 Days: BG < 50 =0 Episodes of symptomatic severe hypoglycemia = 0     HISTORY:  Past Medical History:  Past Medical History:  Diagnosis Date  . Diabetes mellitus without complication ([place])   . Insomnia     Past Surgical History: No past surgical history on file.  Social History:  reports that he has never smoked. He has never used smokeless tobacco. He reports that he does not drink alcohol or use  drugs. Family History:  Family History  Problem Relation Age of Onset  . Colon cancer Mother   . Diabetes Brother      HOME MEDICATIONS: Allergies as of 03/22/2020   No Known Allergies     Medication List       Accurate as of Mar 22, 2020  7:30 AM. If you have any questions, ask your nurse or doctor.        amLODipine 2.5 MG tablet Commonly known as: NORVASC Take 1 tablet (2.5 mg total) by mouth at bedtime.   atorvastatin 40 MG tablet Commonly known as: LIPITOR Take 1 tablet (40 mg total) by mouth daily.   blood glucose meter kit and supplies Dispense based on patient and insurance preference. Use to check glucose daily (FOR ICD-10 E10.9, E11.9).   FreeStyle Libre 14 Day Reader Kerrin Mo 1 Device by Does not apply route as directed.   FreeStyle Libre 14 Day Sensor Misc 1 Device by Does not apply route as directed.   Insulin Pen Needle 32G X 4 MM Misc 1 Device by Does not apply route 2 (two) times daily with a meal.   BD Pen Needle Nano 2nd Gen 32G X 4 MM Misc Generic drug: Insulin Pen Needle Use to inject insulin daily   losartan 50 MG tablet Commonly known as: COZAAR Take 1 tablet (50 mg total) by mouth daily.   NovoLIN  70/30 FlexPen (70-30) 100 UNIT/ML KwikPen Generic drug: insulin isophane & regular human Inject 16 Units into the skin 2 (two) times daily with a meal.   ONE TOUCH DELICA LANCING DEV Misc 1 Device by Does not apply route as directed.   OneTouch Delica Lancets 02H Misc 1 Device by Does not apply route as directed. Use to test up to 4x daily.   OneTouch Ultra test strip Generic drug: glucose blood USE UP TO 4X DAILY   sildenafil 100 MG tablet Commonly known as: Viagra Take 0.5-1 tablets (50-100 mg total) by mouth daily as needed for erectile dysfunction.   sulfamethoxazole-trimethoprim 800-160 MG tablet Commonly known as: BACTRIM DS Take 1 tablet by mouth 2 (two) times daily.        OBJECTIVE:   Vital Signs: BP (!) 164/84 (BP  Location: Left Arm, Patient Position: Sitting, Cuff Size: Large)   Pulse 87   Temp 97.8 F (36.6 C)   Ht 5' 6"  (1.676 m)   Wt 152 lb 3.2 oz (69 kg)   SpO2 98%   BMI 24.57 kg/m   Wt Readings from Last 3 Encounters:  03/22/20 152 lb 3.2 oz (69 kg)  02/21/20 156 lb 8 oz (71 kg)  11/25/19 157 lb 9.6 oz (71.5 kg)     Exam: General: Pt appears well and is in NAD  Lungs: Clear with good BS bilat with no rales, rhonchi, or wheezes  Heart: RRR with normal S1 and S2 and no gallops; no murmurs; no rub  Abdomen: Normoactive bowel sounds, soft, nontender, without masses or organomegaly palpable  Extremities: No pretibial edema. No tremor.  Neuro: MS is good with appropriate affect, pt is alert and Ox3     DM foot exam: 03/22/2020 The skin of the feet is intact without sores or ulcerations. The pedal pulses are 2+ on right and 2+ on left. The sensation is decreased to a screening 5.07, 10 gram monofilament bilaterally    DATA REVIEWED:  Lab Results  Component Value Date   HGBA1C 7.0 (A) 11/25/2019   HGBA1C 8.0 (A) 07/06/2019   HGBA1C 7.8 (H) 03/08/2019   Lab Results  Component Value Date   MICROALBUR 13.6 (H) 11/10/2018   LDLCALC 55 02/21/2020   CREATININE 1.27 02/21/2020   Lab Results  Component Value Date   MICRALBCREAT 7.1 11/10/2018     Lab Results  Component Value Date   CHOL 101 02/21/2020   HDL 34.10 (L) 02/21/2020   LDLCALC 55 02/21/2020   TRIG 58.0 02/21/2020   CHOLHDL 3 02/21/2020         ASSESSMENT / PLAN / RECOMMENDATIONS:   1) Type 2 Diabetes Mellitus, Optimally controlled, With Neuropathic complications - Most recent A1c of 7.0 %. Goal A1c < 7.0 %.    - His A1c continues to be optimal but he reports hypoglycemia in the morning. I have reduced his morning dose last visit to 14 units but he continues on the  18 units. He also admits to occasionally forgetting to take his insulin, which could be the reason for elevated average glucose readings on the   Meter download today. No changes will be made today but we discussed ways for him to remember to take his insulin, he was also advised not to take his insulin post-prandial due to risk of hypoglycemia but to take it preferably 20 minutes before breakfast and dinner.    - He has too many callous formation on his hands, which makes it difficult to check  glucose frequently. His insurance did not cover freestyle libre, will try prescribing Dexcom through [facility] pharmacies.    -He is intolerant to Metformin   Plan: MEDICATIONS:  Novolin Mix (70/30) 18 units Before Breakfast and 18 units before supper   EDUCATION / INSTRUCTIONS:  BG monitoring instructions: Patient is instructed to check his blood sugars 2 times a day, before meals.  Call [place] [facility] if: BG persistently < 70  . I reviewed the Rule of 15 for the treatment of hypoglycemia in detail with the patient. Literature supplied.   F/U in 6 months    Signed electronically by: Mack Guise, MD  [facility]  [facility] [address] [place], [place] [address] Phone: [phone] FAX: [phone]   CC: [place], Betty G, [place] [place] [address] Phone: [phone]  Fax: [phone]  Return to Endocrinology clinic as below: Future Appointments  Date Time Provider [place]  04/04/2020  7:30 AM Martinique, Betty G, MD [id number]

## 2020-03-22 NOTE — Patient Instructions (Addendum)
-    Novolin Mix 18 units Before Breakfast and 18 units before Supper      - HOW TO TREAT LOW BLOOD SUGARS (Blood sugar LESS THAN 70 MG/DL)  Please follow the RULE OF 15 for the treatment of hypoglycemia treatment (when your (blood sugars are less than 70 mg/dL)    STEP 1: Take 15 grams of carbohydrates when your blood sugar is low, which includes:   3-4 GLUCOSE TABS  OR  3-4 OZ OF JUICE OR REGULAR SODA OR  ONE TUBE OF GLUCOSE GEL     STEP 2: RECHECK blood sugar in 15 MINUTES STEP 3: If your blood sugar is still low at the 15 minute recheck --> then, go back to STEP 1 and treat AGAIN with another 15 grams of carbohydrates.

## 2020-03-27 ENCOUNTER — Other Ambulatory Visit: Payer: Self-pay | Admitting: *Deleted

## 2020-03-27 ENCOUNTER — Telehealth: Payer: Self-pay | Admitting: *Deleted

## 2020-03-27 DIAGNOSIS — R972 Elevated prostate specific antigen [PSA]: Secondary | ICD-10-CM

## 2020-03-27 NOTE — Telephone Encounter (Signed)
FYI [name] called office to remind office to call her cell phone, [phone] for anything concerning Isidor Caban per [facility].

## 2020-03-30 ENCOUNTER — Telehealth: Payer: Self-pay | Admitting: Internal Medicine

## 2020-03-30 NOTE — Telephone Encounter (Signed)
Patient's girlfriend called stating she would like the patient to switch doctors from [facility] to [place]. Did not have specific reason but she just said Dr Cruzita Lederer would "work better with the new PCP he's switching to."

## 2020-04-02 NOTE — Telephone Encounter (Signed)
OK Ty, C

## 2020-04-04 ENCOUNTER — Other Ambulatory Visit: Payer: Self-pay

## 2020-04-04 ENCOUNTER — Ambulatory Visit (INDEPENDENT_AMBULATORY_CARE_PROVIDER_SITE_OTHER): Payer: Medicare HMO | Admitting: Family Medicine

## 2020-04-04 ENCOUNTER — Encounter: Payer: Self-pay | Admitting: Family Medicine

## 2020-04-04 VITALS — BP 175/90 | HR 90 | Temp 97.5°F | Resp 12 | Ht 66.0 in | Wt 156.0 lb

## 2020-04-04 DIAGNOSIS — R05 Cough: Secondary | ICD-10-CM | POA: Diagnosis not present

## 2020-04-04 DIAGNOSIS — I10 Essential (primary) hypertension: Secondary | ICD-10-CM | POA: Diagnosis not present

## 2020-04-04 DIAGNOSIS — R972 Elevated prostate specific antigen [PSA]: Secondary | ICD-10-CM | POA: Diagnosis not present

## 2020-04-04 DIAGNOSIS — R059 Cough, unspecified: Secondary | ICD-10-CM

## 2020-04-04 MED ORDER — LOSARTAN POTASSIUM 100 MG PO TABS: 100.0000 mg | ORAL_TABLET | Freq: Every day | ORAL | 1 refills | Status: DC

## 2020-04-04 MED ORDER — AMLODIPINE BESYLATE 5 MG PO TABS: 5.0000 mg | ORAL_TABLET | Freq: Every day | ORAL | 1 refills | Status: DC

## 2020-04-04 NOTE — Patient Instructions (Signed)
A few things to remember from today's visit:   Today Losartan increased from 50 mg to 100 mg and Amlodipine from 2.5 mg to 5 mg. You can take both meds at bedtime. Monitor blood pressure.  If you need refills please call your pharmacy. Do not use My Chart to request refills or for acute issues that need immediate attention.    Please be sure medication list is accurate. If a new problem present, please set up appointment sooner than planned today.

## 2020-04-04 NOTE — Assessment & Plan Note (Signed)
BP still not well controlled. We discussed possible complications of elevated BP. Losartan dose increased to 100 mg and Amlodipine to 5 mg. For better compliance he can take both meds at bedtime. Instructed to monitor BP at home. Low salt diet. Pending appt for eye exam.

## 2020-04-04 NOTE — Progress Notes (Signed)
[name] is a 69 y.o.male, who is here today to follow on HTN. Last follow up visit: 02/21/20. Currently on amlodipine 2.5 mg daily and losartan 50 mg daily. His friend is not certain about compliance with taking meds,he says otherwise. She wonders if he can take meds together.  He has not noted unusual headache, visual changes, exertional chest pain, dyspnea,  focal weakness, or edema. Home BP readings: Not checking.  Lab Results  Component Value Date   CREATININE 1.27 02/21/2020   BUN 28 (H) 02/21/2020   NA 143 02/21/2020   K 4.8 02/21/2020   CL 103 02/21/2020   CO2 34 (H) 02/21/2020   Cough has resolved after stopping Lisinopril. Negative for wheezing.  Concerned about elevated PSA, tried to move appt sooner.  PSA still mildly elevated after completing 4 weeks of Bactrim. He denies dysuria, gross hematuria, or decreased urine output. Negative for back pain.  Review of Systems  Constitutional: Negative for chills, fatigue and fever.  HENT: Negative for hearing loss, mouth sores and sore throat.   Gastrointestinal: Negative for abdominal pain, nausea and vomiting.  Musculoskeletal: Negative for gait problem and myalgias.  Skin: Negative for rash and wound.  Neurological: Negative for syncope and facial asymmetry.  Psychiatric/Behavioral: Negative for confusion.  Rest see pertinent positives and negatives per HPI.  Current Outpatient Medications on File Prior to Visit  Medication Sig Dispense Refill  . atorvastatin (LIPITOR) 40 MG tablet Take 1 tablet (40 mg total) by mouth daily. 90 tablet 1  . blood glucose meter kit and supplies Dispense based on patient and insurance preference. Use to check glucose daily (FOR ICD-10 E10.9, E11.9). 1 each 0  . Continuous Blood Gluc Receiver (DEXCOM G6 RECEIVER) DEVI 1 Device by Does not apply route as directed. 1 each 0  . Continuous Blood Gluc Sensor (DEXCOM G6 SENSOR) MISC 1 Device by Does not apply route as directed. 9  each 3  . Continuous Blood Gluc Transmit (DEXCOM G6 TRANSMITTER) MISC 1 Device by Does not apply route as directed. 1 each 3  . glucose blood (ONETOUCH ULTRA) test strip USE UP TO 4X DAILY 400 strip 1  . insulin isophane & regular human (NOVOLIN 70/30 FLEXPEN) (70-30) 100 UNIT/ML KwikPen Inject 18 Units into the skin daily before breakfast AND 22 Units 2 (two) times daily before lunch and supper. 15 mL 11  . Insulin Pen Needle (BD PEN NEEDLE NANO 2ND GEN) 32G X 4 MM MISC Use to inject insulin daily 90 each 1  . Insulin Pen Needle 32G X 4 MM MISC 1 Device by Does not apply route 2 (two) times daily with a meal. 100 each 3  . Lancet Devices (ONE TOUCH DELICA LANCING DEV) MISC 1 Device by Does not apply route as directed. 1 each 0  . OneTouch Delica Lancets 69I MISC 1 Device by Does not apply route as directed. Use to test up to 4x daily. 400 each 1  . sildenafil (VIAGRA) 100 MG tablet Take 0.5-1 tablets (50-100 mg total) by mouth daily as needed for erectile dysfunction. 10 tablet 3   No current facility-administered medications on file prior to visit.     Past Medical History:  Diagnosis Date  . Diabetes mellitus without complication ([place])   . Insomnia     No Known Allergies  Social History   Socioeconomic History  . Marital status: Single    Spouse name: Not on file  . Number of children: Not on file  .  Years of education: Not on file  . Highest education level: Not on file  Occupational History  . Not on file  Tobacco Use  . Smoking status: Never Smoker  . Smokeless tobacco: Never Used  Substance and Sexual Activity  . Alcohol use: No  . Drug use: No  . Sexual activity: Not on file  Other Topics Concern  . Not on file  Social History Narrative  . Not on file   Social Determinants of Health   Financial Resource Strain:   . Difficulty of Paying Living Expenses:   Food Insecurity:   . Worried About Charity fundraiser in the Last Year:   . Arboriculturist in the Last  Year:   Transportation Needs:   . Film/video editor (Medical):   Marland Kitchen Lack of Transportation (Non-Medical):   Physical Activity:   . Days of Exercise per Week:   . Minutes of Exercise per Session:   Stress:   . Feeling of Stress :   Social Connections:   . Frequency of Communication with Friends and Family:   . Frequency of Social Gatherings with Friends and Family:   . Attends Religious Services:   . Active Member of Clubs or Organizations:   . Attends Archivist Meetings:   Marland Kitchen Marital Status:     Vitals:   04/04/20 0737  BP: (!) 175/90  Pulse: 90  Resp: 12  Temp: (!) 97.5 F (36.4 C)  SpO2: 97%   Body mass index is 25.18 kg/m.   Physical Exam Constitutional:      General: He is not in acute distress.    Appearance: Normal appearance. He is well-developed and normal weight.  HENT:     Head: Normocephalic and atraumatic.  Eyes:     Conjunctiva/sclera: Conjunctivae normal.     Pupils: Pupils are equal, round, and reactive to light.  Neck:     Thyroid: No thyromegaly.  Cardiovascular:     Rate and Rhythm: Normal rate and regular rhythm.     Heart sounds: Murmur (RUSB) heard.  Systolic murmur is present with a grade of 1/6.      Comments: DP pulses present bilateral. Pulmonary:     Effort: Pulmonary effort is normal.     Breath sounds: No wheezing or rales.  Abdominal:     General: There is no distension.     Palpations: Abdomen is soft. There is no mass.     Tenderness: There is no abdominal tenderness.  Musculoskeletal:     Cervical back: Neck supple.  Lymphadenopathy:     Cervical: No cervical adenopathy.  Skin:    General: Skin is warm.  Neurological:     Mental Status: He is alert and oriented to person, place, and time.     Coordination: Coordination normal.  Psychiatric:        Mood and Affect: Mood and affect normal.    ASSESSMENT AND PLAN:   Mr.[name] was seen today for follow-up.  Diagnoses and all orders for this visit:   Cough Resolved after discontinuing lisinopril. Follow-up as needed.  Elevated PSA Has appt with urologist. We discussed current recommendations in regard to prostate cancer screening,Dx,prognosis, and treatment.  Essential hypertension BP still not well controlled. We discussed possible complications of elevated BP. Losartan dose increased to 100 mg and Amlodipine to 5 mg. For better compliance he can take both meds at bedtime. Instructed to monitor BP at home. Low salt diet. Pending appt for eye exam.  Return in about 4 weeks (around 05/02/2020) for HTN.   Betty G. Martinique, MD  [facility]. [place] office.  A few things to remember from today's visit:   Today Losartan increased from 50 mg to 100 mg and Amlodipine from 2.5 mg to 5 mg. You can take both meds at bedtime. Monitor blood pressure.  If you need refills please call your pharmacy. Do not use My Chart to request refills or for acute issues that need immediate attention.    Please be sure medication list is accurate. If a new problem present, please set up appointment sooner than planned today.

## 2020-04-08 DIAGNOSIS — R69 Illness, unspecified: Secondary | ICD-10-CM | POA: Diagnosis not present

## 2020-04-20 ENCOUNTER — Telehealth: Payer: Self-pay

## 2020-04-20 NOTE — Telephone Encounter (Signed)
Pt request sent to PCP for advise 

## 2020-04-21 ENCOUNTER — Other Ambulatory Visit: Payer: Self-pay | Admitting: Family Medicine

## 2020-04-21 DIAGNOSIS — E1169 Type 2 diabetes mellitus with other specified complication: Secondary | ICD-10-CM

## 2020-04-21 DIAGNOSIS — E785 Hyperlipidemia, unspecified: Secondary | ICD-10-CM

## 2020-04-23 ENCOUNTER — Telehealth: Payer: Self-pay | Admitting: Family Medicine

## 2020-04-23 ENCOUNTER — Other Ambulatory Visit: Payer: Self-pay | Admitting: Family Medicine

## 2020-04-23 DIAGNOSIS — E1169 Type 2 diabetes mellitus with other specified complication: Secondary | ICD-10-CM

## 2020-04-23 NOTE — Telephone Encounter (Signed)
Waiting for triage notes.

## 2020-04-23 NOTE — Telephone Encounter (Signed)
Pt states he is out of the medication and refill for 90 days

## 2020-04-23 NOTE — Telephone Encounter (Signed)
I spoke with pt's significant other, appt scheduled for tomorrow at 1:45 with Dr. Sarajane Jews since PCP had no openings.

## 2020-04-23 NOTE — Telephone Encounter (Signed)
Patients wife called needing to make an appointment for the patient for having chest pain that started yesterday. I transfer her to nurse triage.  Waiting nurse triage notes

## 2020-04-24 ENCOUNTER — Ambulatory Visit (INDEPENDENT_AMBULATORY_CARE_PROVIDER_SITE_OTHER): Payer: Medicare HMO | Admitting: Family Medicine

## 2020-04-24 ENCOUNTER — Other Ambulatory Visit: Payer: Self-pay

## 2020-04-24 ENCOUNTER — Encounter: Payer: Self-pay | Admitting: Family Medicine

## 2020-04-24 VITALS — BP 160/70 | HR 89 | Temp 98.1°F | Wt 161.2 lb

## 2020-04-24 DIAGNOSIS — R079 Chest pain, unspecified: Secondary | ICD-10-CM | POA: Diagnosis not present

## 2020-04-24 DIAGNOSIS — K219 Gastro-esophageal reflux disease without esophagitis: Secondary | ICD-10-CM

## 2020-04-24 MED ORDER — OMEPRAZOLE 40 MG PO CPDR
40.0000 mg | DELAYED_RELEASE_CAPSULE | Freq: Every day | ORAL | 1 refills | Status: DC
Start: 2020-04-24 — End: 2020-06-16

## 2020-04-24 NOTE — Progress Notes (Signed)
   Subjective:    Patient ID: [name], male    DOB: 10/23/1951, 69 y.o.   MRN: [id number]  HPI Here for several episodes of right sided chest pain in the past few months. These last about 24-48 hours and then they stop. The pain is a dull aching pain, not sharp or burning. It is not associated with exertion. He does think it is brought on by certain foods. For example this last episode, which lasted 2 days over this past weekend, started immediately after he ate a spicy taco. There is no SOB or nausea or sweats. No trouble swallowing. He admits to frequent heartburn.    Review of Systems  Constitutional: Negative.   Respiratory: Negative.   Cardiovascular: Positive for chest pain. Negative for palpitations and leg swelling.  Gastrointestinal: Negative.        Objective:   Physical Exam Constitutional:      Appearance: Normal appearance. He is well-developed. He is not ill-appearing.  Cardiovascular:     Rate and Rhythm: Normal rate and regular rhythm.     Pulses: Normal pulses.     Heart sounds: Normal heart sounds.  Pulmonary:     Effort: Pulmonary effort is normal.     Breath sounds: Normal breath sounds.  Chest:     Chest wall: No tenderness.  Abdominal:     General: Abdomen is flat. Bowel sounds are normal. There is no distension.     Palpations: Abdomen is soft. There is no mass.     Tenderness: There is no abdominal tenderness. There is no guarding or rebound.     Hernia: No hernia is present.  Neurological:     Mental Status: He is alert.           Assessment & Plan:  These episodes are likely due to esophageal spasm brought on by GERD. He will take Omeprazole daily for a month or two, then recheck as needed.  Alysia Penna, MD

## 2020-04-25 NOTE — Telephone Encounter (Signed)
[place] with me. We need to cancel appt in 04/2020. Thanks

## 2020-04-26 NOTE — Telephone Encounter (Signed)
Left a message for pt to call the office and schedule a TOC appointment

## 2020-04-27 ENCOUNTER — Telehealth: Payer: Self-pay | Admitting: Family Medicine

## 2020-04-27 NOTE — Telephone Encounter (Signed)
[name] called stating [name] had called her saying Dr banks could be his new provider.  She is requesting a call back to talk to [place].

## 2020-04-27 NOTE — Telephone Encounter (Signed)
Called pt on both numbers no answer °

## 2020-05-01 NOTE — Telephone Encounter (Signed)
Pt called and is scheduled for 05/04/2020 at 8am

## 2020-05-01 NOTE — Telephone Encounter (Signed)
Spoke with pt aware that both Dr Volanda Napoleon and Dr Martinique have approved pt TOC to Dr Volanda Napoleon, pt state that she will schedule appointment for [facility]

## 2020-05-02 ENCOUNTER — Ambulatory Visit: Payer: Medicare HMO | Admitting: Family Medicine

## 2020-05-04 ENCOUNTER — Other Ambulatory Visit: Payer: Self-pay

## 2020-05-04 ENCOUNTER — Ambulatory Visit (INDEPENDENT_AMBULATORY_CARE_PROVIDER_SITE_OTHER): Payer: Medicare HMO | Admitting: Family Medicine

## 2020-05-04 ENCOUNTER — Encounter: Payer: Self-pay | Admitting: Family Medicine

## 2020-05-04 VITALS — BP 130/88 | HR 75 | Temp 97.6°F | Ht 66.0 in | Wt 153.2 lb

## 2020-05-04 DIAGNOSIS — E1142 Type 2 diabetes mellitus with diabetic polyneuropathy: Secondary | ICD-10-CM

## 2020-05-04 DIAGNOSIS — I10 Essential (primary) hypertension: Secondary | ICD-10-CM

## 2020-05-04 DIAGNOSIS — Z794 Long term (current) use of insulin: Secondary | ICD-10-CM | POA: Diagnosis not present

## 2020-05-04 DIAGNOSIS — R972 Elevated prostate specific antigen [PSA]: Secondary | ICD-10-CM

## 2020-05-04 DIAGNOSIS — R6889 Other general symptoms and signs: Secondary | ICD-10-CM | POA: Diagnosis not present

## 2020-05-04 NOTE — Patient Instructions (Signed)
Managing Your Hypertension Hypertension is commonly called high blood pressure. This is when the force of your blood pressing against the walls of your arteries is too strong. Arteries are blood vessels that carry blood from your heart throughout your body. Hypertension forces the heart to work harder to pump blood, and may cause the arteries to become narrow or stiff. Having untreated or uncontrolled hypertension can cause heart attack, stroke, kidney disease, and other problems. What are blood pressure readings? A blood pressure reading consists of a higher number over a lower number. Ideally, your blood pressure should be below 120/80. The first ("top") number is called the systolic pressure. It is a measure of the pressure in your arteries as your heart beats. The second ("bottom") number is called the diastolic pressure. It is a measure of the pressure in your arteries as the heart relaxes. What does my blood pressure reading mean? Blood pressure is classified into four stages. Based on your blood pressure reading, your health care provider may use the following stages to determine what type of treatment you need, if any. Systolic pressure and diastolic pressure are measured in a unit called mm Hg. Normal  Systolic pressure: below 120.  Diastolic pressure: below 80. Elevated  Systolic pressure: 120-129.  Diastolic pressure: below 80. Hypertension stage 1  Systolic pressure: 130-139.  Diastolic pressure: 80-89. Hypertension stage 2  Systolic pressure: 140 or above.  Diastolic pressure: 90 or above. What health risks are associated with hypertension? Managing your hypertension is an important responsibility. Uncontrolled hypertension can lead to:  A heart attack.  A stroke.  A weakened blood vessel (aneurysm).  Heart failure.  Kidney damage.  Eye damage.  Metabolic syndrome.  Memory and concentration problems. What changes can I make to manage my  hypertension? Hypertension can be managed by making lifestyle changes and possibly by taking medicines. Your health care provider will help you make a plan to bring your blood pressure within a normal range. Eating and drinking   Eat a diet that is high in fiber and potassium, and low in salt (sodium), added sugar, and fat. An example eating plan is called the DASH (Dietary Approaches to Stop Hypertension) diet. To eat this way: ? Eat plenty of fresh fruits and vegetables. Try to fill half of your plate at each meal with fruits and vegetables. ? Eat whole grains, such as whole wheat pasta, brown rice, or whole grain bread. Fill about one quarter of your plate with whole grains. ? Eat low-fat diary products. ? Avoid fatty cuts of meat, processed or cured meats, and poultry with skin. Fill about one quarter of your plate with lean proteins such as fish, chicken without skin, beans, eggs, and tofu. ? Avoid premade and processed foods. These tend to be higher in sodium, added sugar, and fat.  Reduce your daily sodium intake. Most people with hypertension should eat less than 1,500 mg of sodium a day.  Limit alcohol intake to no more than 1 drink a day for nonpregnant women and 2 drinks a day for men. One drink equals 12 oz of beer, 5 oz of wine, or 1 oz of hard liquor. Lifestyle  Work with your health care provider to maintain a healthy body weight, or to lose weight. Ask what an ideal weight is for you.  Get at least 30 minutes of exercise that causes your heart to beat faster (aerobic exercise) most days of the week. Activities may include walking, swimming, or biking.  Include exercise   to strengthen your muscles (resistance exercise), such as weight lifting, as part of your weekly exercise routine. Try to do these types of exercises for 30 minutes at least 3 days a week.  Do not use any products that contain nicotine or tobacco, such as cigarettes and e-cigarettes. If you need help quitting,  ask your health care provider.  Control any long-term (chronic) conditions you have, such as high cholesterol or diabetes. Monitoring  Monitor your blood pressure at home as told by your health care provider. Your personal target blood pressure may vary depending on your medical conditions, your age, and other factors.  Have your blood pressure checked regularly, as often as told by your health care provider. Working with your health care provider  Review all the medicines you take with your health care provider because there may be side effects or interactions.  Talk with your health care provider about your diet, exercise habits, and other lifestyle factors that may be contributing to hypertension.  Visit your health care provider regularly. Your health care provider can help you create and adjust your plan for managing hypertension. Will I need medicine to control my blood pressure? Your health care provider may prescribe medicine if lifestyle changes are not enough to get your blood pressure under control, and if:  Your systolic blood pressure is 130 or higher.  Your diastolic blood pressure is 80 or higher. Take medicines only as told by your health care provider. Follow the directions carefully. Blood pressure medicines must be taken as prescribed. The medicine does not work as well when you skip doses. Skipping doses also puts you at risk for problems. Contact a health care provider if:  You think you are having a reaction to medicines you have taken.  You have repeated (recurrent) headaches.  You feel dizzy.  You have swelling in your ankles.  You have trouble with your vision. Get help right away if:  You develop a severe headache or confusion.  You have unusual weakness or numbness, or you feel faint.  You have severe pain in your chest or abdomen.  You vomit repeatedly.  You have trouble breathing. Summary  Hypertension is when the force of blood pumping  through your arteries is too strong. If this condition is not controlled, it may put you at risk for serious complications.  Your personal target blood pressure may vary depending on your medical conditions, your age, and other factors. For most people, a normal blood pressure is less than 120/80.  Hypertension is managed by lifestyle changes, medicines, or both. Lifestyle changes include weight loss, eating a healthy, low-sodium diet, exercising more, and limiting alcohol. This information is not intended to replace advice given to you by your health care provider. Make sure you discuss any questions you have with your health care provider. Document Revised: 02/04/2019 Document Reviewed: 09/10/2016 Elsevier Patient Education  2020 [place].  Diabetes Basics  Diabetes (diabetes mellitus) is a long-term (chronic) disease. It occurs when the body does not properly use sugar (glucose) that is released from food after you eat. Diabetes may be caused by one or both of these problems:  Your pancreas does not make enough of a hormone called insulin.  Your body does not react in a normal way to insulin that it makes. Insulin lets sugars (glucose) go into cells in your body. This gives you energy. If you have diabetes, sugars cannot get into cells. This causes high blood sugar (hyperglycemia). Follow these instructions at home:  How is diabetes treated? You may need to take insulin or other diabetes medicines daily to keep your blood sugar in balance. Take your diabetes medicines every day as told by your doctor. List your diabetes medicines here: Diabetes medicines  Name of medicine: ______________________________ ? Amount (dose): _______________ Time (a.m./p.m.): _______________ Notes: ___________________________________  Name of medicine: ______________________________ ? Amount (dose): _______________ Time (a.m./p.m.): _______________ Notes: ___________________________________  Name of  medicine: ______________________________ ? Amount (dose): _______________ Time (a.m./p.m.): _______________ Notes: ___________________________________ If you use insulin, you will learn how to give yourself insulin by injection. You may need to adjust the amount based on the food that you eat. List the types of insulin you use here: Insulin  Insulin type: ______________________________ ? Amount (dose): _______________ Time (a.m./p.m.): _______________ Notes: ___________________________________  Insulin type: ______________________________ ? Amount (dose): _______________ Time (a.m./p.m.): _______________ Notes: ___________________________________  Insulin type: ______________________________ ? Amount (dose): _______________ Time (a.m./p.m.): _______________ Notes: ___________________________________  Insulin type: ______________________________ ? Amount (dose): _______________ Time (a.m./p.m.): _______________ Notes: ___________________________________  Insulin type: ______________________________ ? Amount (dose): _______________ Time (a.m./p.m.): _______________ Notes: ___________________________________ How do I manage my blood sugar?  Check your blood sugar levels using a blood glucose monitor as directed by your doctor. Your doctor will set treatment goals for you. Generally, you should have these blood sugar levels:  Before meals (preprandial): 80-130 mg/dL (4.4-7.2 mmol/L).  After meals (postprandial): below 180 mg/dL (10 mmol/L).  A1c level: less than 7%. Write down the times that you will check your blood sugar levels: Blood sugar checks  Time: _______________ Notes: ___________________________________  Time: _______________ Notes: ___________________________________  Time: _______________ Notes: ___________________________________  Time: _______________ Notes: ___________________________________  Time: _______________ Notes:  ___________________________________  Time: _______________ Notes: ___________________________________  What do I need to know about low blood sugar? Low blood sugar is called hypoglycemia. This is when blood sugar is at or below 70 mg/dL (3.9 mmol/L). Symptoms may include:  Feeling: ? Hungry. ? Worried or nervous (anxious). ? Sweaty and clammy. ? Confused. ? Dizzy. ? Sleepy. ? Sick to your stomach (nauseous).  Having: ? A fast heartbeat. ? A headache. ? A change in your vision. ? Tingling or no feeling (numbness) around the mouth, lips, or tongue. ? Jerky movements that you cannot control (seizure).  Having trouble with: ? Moving (coordination). ? Sleeping. ? Passing out (fainting). ? Getting upset easily (irritability). Treating low blood sugar To treat low blood sugar, eat or drink something sugary right away. If you can think clearly and swallow safely, follow the 15:15 rule:  Take 15 grams of a fast-acting carb (carbohydrate). Talk with your doctor about how much you should take.  Some fast-acting carbs are: ? Sugar tablets (glucose pills). Take 3-4 glucose pills. ? 6-8 pieces of hard candy. ? 4-6 oz (120-150 mL) of fruit juice. ? 4-6 oz (120-150 mL) of regular (not diet) soda. ? 1 Tbsp (15 mL) honey or sugar.  Check your blood sugar 15 minutes after you take the carb.  If your blood sugar is still at or below 70 mg/dL (3.9 mmol/L), take 15 grams of a carb again.  If your blood sugar does not go above 70 mg/dL (3.9 mmol/L) after 3 tries, get help right away.  After your blood sugar goes back to normal, eat a meal or a snack within 1 hour. Treating very low blood sugar If your blood sugar is at or below 54 mg/dL (3 mmol/L), you have very low blood sugar (severe hypoglycemia). This is an emergency. Do not wait  to see if the symptoms will go away. Get medical help right away. Call your local emergency services (911 in the [place] Do not drive yourself to the  hospital. Questions to ask your health care provider  Do I need to meet with a diabetes educator?  What equipment will I need to care for myself at home?  What diabetes medicines do I need? When should I take them?  How often do I need to check my blood sugar?  What number can I call if I have questions?  When is my next doctor's visit?  Where can I find a support group for people with diabetes? Where to find more information  American Diabetes Association: www.diabetes.org  American Association of Diabetes Educators: www.diabeteseducator.org/patient-resources Contact a doctor if:  Your blood sugar is at or above 240 mg/dL (13.3 mmol/L) for 2 days in a row.  You have been sick or have had a fever for 2 days or more, and you are not getting better.  You have any of these problems for more than 6 hours: ? You cannot eat or drink. ? You feel sick to your stomach (nauseous). ? You throw up (vomit). ? You have watery poop (diarrhea). Get help right away if:  Your blood sugar is lower than 54 mg/dL (3 mmol/L).  You get confused.  You have trouble: ? Thinking clearly. ? Breathing. Summary  Diabetes (diabetes mellitus) is a long-term (chronic) disease. It occurs when the body does not properly use sugar (glucose) that is released from food after digestion.  Take insulin and diabetes medicines as told.  Check your blood sugar every day, as often as told.  Keep all follow-up visits as told by your doctor. This is important. This information is not intended to replace advice given to you by your health care provider. Make sure you discuss any questions you have with your health care provider. Document Revised: 07/06/2019 Document Reviewed: 01/15/2018 Elsevier Patient Education  [place].  Diabetes Mellitus and Exercise Exercising regularly is important for your overall health, especially when you have diabetes (diabetes mellitus). Exercising is not only about  losing weight. It has many other health benefits, such as increasing muscle strength and bone density and reducing body fat and stress. This leads to improved fitness, flexibility, and endurance, all of which result in better overall health. Exercise has additional benefits for people with diabetes, including:  Reducing appetite.  Helping to lower and control blood glucose.  Lowering blood pressure.  Helping to control amounts of fatty substances (lipids) in the blood, such as cholesterol and triglycerides.  Helping the body to respond better to insulin (improving insulin sensitivity).  Reducing how much insulin the body needs.  Decreasing the risk for heart disease by: ? Lowering cholesterol and triglyceride levels. ? Increasing the levels of good cholesterol. ? Lowering blood glucose levels. What is my activity plan? Your health care provider or certified diabetes educator can help you make a plan for the type and frequency of exercise (activity plan) that works for you. Make sure that you:  Do at least 150 minutes of moderate-intensity or vigorous-intensity exercise each week. This could be brisk walking, biking, or water aerobics. ? Do stretching and strength exercises, such as yoga or weightlifting, at least 2 times a week. ? Spread out your activity over at least 3 days of the week.  Get some form of physical activity every day. ? Do not go more than 2 days in a row without some  kind of physical activity. ? Avoid being inactive for more than 30 minutes at a time. Take frequent breaks to walk or stretch.  Choose a type of exercise or activity that you enjoy, and set realistic goals.  Start slowly, and gradually increase the intensity of your exercise over time. What do I need to know about managing my diabetes?   Check your blood glucose before and after exercising. ? If your blood glucose is 240 mg/dL (13.3 mmol/L) or higher before you exercise, check your urine for  ketones. If you have ketones in your urine, do not exercise until your blood glucose returns to normal. ? If your blood glucose is 100 mg/dL (5.6 mmol/L) or lower, eat a snack containing 15-20 grams of carbohydrate. Check your blood glucose 15 minutes after the snack to make sure that your level is above 100 mg/dL (5.6 mmol/L) before you start your exercise.  Know the symptoms of low blood glucose (hypoglycemia) and how to treat it. Your risk for hypoglycemia increases during and after exercise. Common symptoms of hypoglycemia can include: ? Hunger. ? Anxiety. ? Sweating and feeling clammy. ? Confusion. ? Dizziness or feeling light-headed. ? Increased heart rate or palpitations. ? Blurry vision. ? Tingling or numbness around the mouth, lips, or tongue. ? Tremors or shakes. ? Irritability.  Keep a rapid-acting carbohydrate snack available before, during, and after exercise to help prevent or treat hypoglycemia.  Avoid injecting insulin into areas of the body that are going to be exercised. For example, avoid injecting insulin into: ? The arms, when playing tennis. ? The legs, when jogging.  Keep records of your exercise habits. Doing this can help you and your health care provider adjust your diabetes management plan as needed. Write down: ? Food that you eat before and after you exercise. ? Blood glucose levels before and after you exercise. ? The type and amount of exercise you have done. ? When your insulin is expected to peak, if you use insulin. Avoid exercising at times when your insulin is peaking.  When you start a new exercise or activity, work with your health care provider to make sure the activity is safe for you, and to adjust your insulin, medicines, or food intake as needed.  Drink plenty of water while you exercise to prevent dehydration or heat stroke. Drink enough fluid to keep your urine clear or pale yellow. Summary  Exercising regularly is important for your  overall health, especially when you have diabetes (diabetes mellitus).  Exercising has many health benefits, such as increasing muscle strength and bone density and reducing body fat and stress.  Your health care provider or certified diabetes educator can help you make a plan for the type and frequency of exercise (activity plan) that works for you.  When you start a new exercise or activity, work with your health care provider to make sure the activity is safe for you, and to adjust your insulin, medicines, or food intake as needed. This information is not intended to replace advice given to you by your health care provider. Make sure you discuss any questions you have with your health care provider. Document Revised: 05/07/2017 Document Reviewed: 03/24/2016 Elsevier Patient Education  [place].

## 2020-05-04 NOTE — Progress Notes (Signed)
Subjective:    Patient ID: [name], male    DOB: [birth year], 69 y.o.   MRN: [id number]  Chief Complaint  Patient presents with  . Transfer of Care    HPI Patient was seen today for TOC and f/u on chronic conditions.  Patient previously seen by Dr. [place].  HTN: -Taking Norvasc 5 mg daily -Checks BP at home, typically 1 teens-130s/80s -Patient does not exercise  DM2: -Followed by Dr. Kelton Pillar, endocrinology -FSBS typically 140s-160s -Taking Novolin 70/30 18 units twice daily -In the past metformin caused dizziness -Lantus did not work -Patient endorses neuropathy in bilateral feet -Patient may drink 1 diet soda per day -States he knows what he needs to do as far as diet.  Elevated PSA/BPH: -PSA 6.49 on 03/21/2020 -Patient has appointment with urology in a few weeks  Weight change?: -Patient's friend states that last with decreased weight was 160s. -Patient states typically 150s. -Patient denies weight changes or appetite changes Allergies: NKDA  GERD: -Recently started taking omeprazole after having chest discomfort -Endorses resolution of symptoms -Taking meds  Social history: Patient works doing [employer].  Patient denies alcohol, tobacco, drug use.  Past Medical History:  Diagnosis Date  . Diabetes mellitus without complication ([place])   . Insomnia     No Known Allergies  ROS General: Denies fever, chills, night sweats, changes in weight, changes in appetite + weight change? HEENT: Denies headaches, ear pain, changes in vision, rhinorrhea, sore throat CV: Denies CP, palpitations, SOB, orthopnea Pulm: Denies SOB, cough, wheezing GI: Denies abdominal pain, nausea, vomiting, diarrhea, constipation + GERD GU: Denies dysuria, hematuria, frequency Msk: Denies muscle cramps, joint pains Neuro: Denies weakness, tingling  + neuropathy Skin: Denies rashes, bruising Psych: Denies depression, anxiety, hallucinations     Objective:    Blood pressure  130/88, pulse 75, temperature 97.6 F (36.4 C), temperature source Temporal, height 5\' 6"  (1.676 m), weight 153 lb 4 oz (69.5 kg), SpO2 97 %.   Gen. Pleasant, well-nourished, in no distress, normal affect   HEENT: [place]/AT, face symmetric, conjunctiva clear, no scleral icterus, PERRLA, EOMI, nares patent without drainage, pharynx without erythema or exudate.  TMs normal bilaterally. Lungs: no accessory muscle use, CTAB, no wheezes or rales Cardiovascular: RRR, faint 1/6 murmur, no peripheral edema Musculoskeletal: No deformities, no cyanosis or clubbing, normal tone Neuro:  A&Ox3, CN II-XII intact, normal gait Skin:  Warm, no lesions/ rash   Wt Readings from Last 3 Encounters:  05/04/20 153 lb 4 oz (69.5 kg)  04/24/20 161 lb 3.2 oz (73.1 kg)  04/04/20 156 lb (70.8 kg)    Lab Results  Component Value Date   WBC 3.3 (L) 11/10/2018   HGB 12.9 (L) 11/10/2018   HCT 36.3 (L) 11/10/2018   PLT 332.0 11/10/2018   GLUCOSE 209 (H) 02/21/2020   CHOL 101 02/21/2020   TRIG 58.0 02/21/2020   HDL 34.10 (L) 02/21/2020   LDLCALC 55 02/21/2020   ALT 29 02/21/2020   AST 30 02/21/2020   NA 143 02/21/2020   K 4.8 02/21/2020   CL 103 02/21/2020   CREATININE 1.27 02/21/2020   BUN 28 (H) 02/21/2020   CO2 34 (H) 02/21/2020   TSH 0.78 11/10/2018   PSA 6.49 (H) 03/21/2020   HGBA1C 7.0 (A) 03/22/2020   MICROALBUR 13.6 (H) 11/10/2018    Assessment/Plan:  Essential hypertension -Continue current medications including Norvasc 5 mg, losartan 100 mg -Lifestyle modifications encouraged -Patient encouraged to check BP at home and keep a log to bring  with him to clinic  Type 2 diabetes mellitus with diabetic polyneuropathy, with long-term current use of insulin (HCC) -Hemoglobin A1c 7.0% on 03/22/2020 -Continue Novolin 70/30 18 units twice daily -Continue lifestyle modifications.  Portion control and decreasing carbs encouraged. -Continue follow-up with endocrinology, Dr. Kelton Pillar  Elevated  PSA -PSA 6.  49 on 03/22/2019 -Patient encouraged to keep upcoming appointment  with urology  Recent change in weight -Per chart review weight 161 lbs on 04/24/2020 otherwise 150s. -156 lb on 04/04/2020 and 152 lb on 03/22/2020 -Discussed possible causes including differences in scales. -Patient encouraged to check weight daily until next OFV and keep a record to bring with him   F/u in 2 weeks for weight check  Grier Mitts, MD

## 2020-05-18 ENCOUNTER — Ambulatory Visit (INDEPENDENT_AMBULATORY_CARE_PROVIDER_SITE_OTHER): Payer: Medicare HMO | Admitting: Family Medicine

## 2020-05-18 ENCOUNTER — Other Ambulatory Visit: Payer: Self-pay

## 2020-05-18 ENCOUNTER — Encounter: Payer: Self-pay | Admitting: Family Medicine

## 2020-05-18 ENCOUNTER — Encounter: Payer: Self-pay | Admitting: Gastroenterology

## 2020-05-18 VITALS — BP 162/84 | HR 80 | Temp 97.7°F | Wt 155.0 lb

## 2020-05-18 DIAGNOSIS — R6889 Other general symptoms and signs: Secondary | ICD-10-CM | POA: Diagnosis not present

## 2020-05-18 DIAGNOSIS — I10 Essential (primary) hypertension: Secondary | ICD-10-CM | POA: Diagnosis not present

## 2020-05-18 DIAGNOSIS — Z1211 Encounter for screening for malignant neoplasm of colon: Secondary | ICD-10-CM | POA: Diagnosis not present

## 2020-05-18 DIAGNOSIS — E1142 Type 2 diabetes mellitus with diabetic polyneuropathy: Secondary | ICD-10-CM

## 2020-05-18 NOTE — Addendum Note (Signed)
Addended by: Wyvonne Lenz on: 05/18/2020 08:58 AM   Modules accepted: Orders

## 2020-05-18 NOTE — Progress Notes (Signed)
Subjective:    Patient ID: [name], male    DOB: 1951-03-05, 69 y.o.   MRN: [id number]  No chief complaint on file. Pt accompanied by his significant other, [name]  HPI Patient was seen today for weight check.  At last [facility], pt/his friend mentioned changes in weight and was to keep a log of his weight.  Pt does not have his log with him.  Pt states bp typically 130/70 and blood sugar has been good but does not have a log or his meter is with him.  Pt has not had a colonoscopy.    Past Medical History:  Diagnosis Date  . Diabetes mellitus without complication ([place])   . Insomnia     No Known Allergies  ROS General: Denies fever, chills, night sweats, changes in appetite +changes in weight HEENT: Denies headaches, ear pain, changes in vision, rhinorrhea, sore throat CV: Denies CP, palpitations, SOB, orthopnea Pulm: Denies SOB, cough, wheezing GI: Denies abdominal pain, nausea, vomiting, diarrhea, constipation GU: Denies dysuria, hematuria, frequency, vaginal discharge Msk: Denies muscle cramps, joint pains Neuro: Denies weakness, numbness, tingling Skin: Denies rashes, bruising Psych: Denies depression, anxiety, hallucinations    Objective:    Blood pressure (!) 152/90, pulse 80, temperature 97.7 F (36.5 C), temperature source Temporal, weight 155 lb (70.3 kg), SpO2 96 %.  BP recheck 162/84  Gen. Pleasant, well-nourished, in no distress, normal affect   HEENT: [place]/AT, face symmetric, conjunctiva clear, no scleral icterus, PERRLA, EOMI, nares patent without drainage Lungs: no accessory muscle use, CTAB, no wheezes or rales Cardiovascular: RRR, no m/r/g, no peripheral edema Neuro:  A&Ox3, CN II-XII intact, normal gait Skin:  Warm, no lesions/ rash  Wt Readings from Last 3 Encounters:  05/04/20 153 lb 4 oz (69.5 kg)  04/24/20 161 lb 3.2 oz (73.1 kg)  04/04/20 156 lb (70.8 kg)    Lab Results  Component Value Date   WBC 3.3 (L) 11/10/2018   HGB 12.9 (L)  11/10/2018   HCT 36.3 (L) 11/10/2018   PLT 332.0 11/10/2018   GLUCOSE 209 (H) 02/21/2020   CHOL 101 02/21/2020   TRIG 58.0 02/21/2020   HDL 34.10 (L) 02/21/2020   LDLCALC 55 02/21/2020   ALT 29 02/21/2020   AST 30 02/21/2020   NA 143 02/21/2020   K 4.8 02/21/2020   CL 103 02/21/2020   CREATININE 1.27 02/21/2020   BUN 28 (H) 02/21/2020   CO2 34 (H) 02/21/2020   TSH 0.78 11/10/2018   PSA 6.49 (H) 03/21/2020   HGBA1C 7.0 (A) 03/22/2020   MICROALBUR 13.6 (H) 11/10/2018    Assessment/Plan:  Essential hypertension -elevated -recheck 162/84 -Continue Norvasc 5 mg and losartan 100 mg -Discussed lifestyle modifications -Given handout -Patient encouraged to bring BP cuff/log to next office visit  Type 2 diabetes mellitus with diabetic polyneuropathy, without long-term current use of insulin (HCC) -Continue checking FSBS regularly -Continue follow-up with endocrinology.  In the process of switching providers.   - Plan: Ambulatory referral to Ophthalmology  Colon cancer screening  - Plan: Ambulatory referral to Gastroenterology  Recent change in weight -Patient encouraged to keep a log of wts and food diary -Patient encouraged to eat regular meals -Referral to GI for colonoscopy placed. -pt also has upcoming follow-up with [facility] for elevated PSA.  F/u in 1 month  Grier Mitts, MD

## 2020-05-18 NOTE — Patient Instructions (Addendum)
Managing Your Hypertension Hypertension is commonly called high blood pressure. This is when the force of your blood pressing against the walls of your arteries is too strong. Arteries are blood vessels that carry blood from your heart throughout your body. Hypertension forces the heart to work harder to pump blood, and may cause the arteries to become narrow or stiff. Having untreated or uncontrolled hypertension can cause heart attack, stroke, kidney disease, and other problems. What are blood pressure readings? A blood pressure reading consists of a higher number over a lower number. Ideally, your blood pressure should be below 120/80. The first ("top") number is called the systolic pressure. It is a measure of the pressure in your arteries as your heart beats. The second ("bottom") number is called the diastolic pressure. It is a measure of the pressure in your arteries as the heart relaxes. What does my blood pressure reading mean? Blood pressure is classified into four stages. Based on your blood pressure reading, your health care provider may use the following stages to determine what type of treatment you need, if any. Systolic pressure and diastolic pressure are measured in a unit called mm Hg. Normal  Systolic pressure: below 831.  Diastolic pressure: below 80. Elevated  Systolic pressure: 517-616.  Diastolic pressure: below 80. Hypertension stage 1  Systolic pressure: 073-710.  Diastolic pressure: 62-69. Hypertension stage 2  Systolic pressure: 485 or above.  Diastolic pressure: 90 or above. What health risks are associated with hypertension? Managing your hypertension is an important responsibility. Uncontrolled hypertension can lead to:  A heart attack.  A stroke.  A weakened blood vessel (aneurysm).  Heart failure.  Kidney damage.  Eye damage.  Metabolic syndrome.  Memory and concentration problems. What changes can I make to manage my  hypertension? Hypertension can be managed by making lifestyle changes and possibly by taking medicines. Your health care provider will help you make a plan to bring your blood pressure within a normal range. Eating and drinking   Eat a diet that is high in fiber and potassium, and low in salt (sodium), added sugar, and fat. An example eating plan is called the DASH (Dietary Approaches to Stop Hypertension) diet. To eat this way: ? Eat plenty of fresh fruits and vegetables. Try to fill half of your plate at each meal with fruits and vegetables. ? Eat whole grains, such as whole wheat pasta, brown rice, or whole grain bread. Fill about one quarter of your plate with whole grains. ? Eat low-fat diary products. ? Avoid fatty cuts of meat, processed or cured meats, and poultry with skin. Fill about one quarter of your plate with lean proteins such as fish, chicken without skin, beans, eggs, and tofu. ? Avoid premade and processed foods. These tend to be higher in sodium, added sugar, and fat.  Reduce your daily sodium intake. Most people with hypertension should eat less than 1,500 mg of sodium a day.  Limit alcohol intake to no more than 1 drink a day for nonpregnant women and 2 drinks a day for men. One drink equals 12 oz of beer, 5 oz of wine, or 1 oz of hard liquor. Lifestyle  Work with your health care provider to maintain a healthy body weight, or to lose weight. Ask what an ideal weight is for you.  Get at least 30 minutes of exercise that causes your heart to beat faster (aerobic exercise) most days of the week. Activities may include walking, swimming, or biking.  Include  exercise to strengthen your muscles (resistance exercise), such as weight lifting, as part of your weekly exercise routine. Try to do these types of exercises for 30 minutes at least 3 days a week.  Do not use any products that contain nicotine or tobacco, such as cigarettes and e-cigarettes. If you need help quitting,  ask your health care provider.  Control any long-term (chronic) conditions you have, such as high cholesterol or diabetes. Monitoring  Monitor your blood pressure at home as told by your health care provider. Your personal target blood pressure may vary depending on your medical conditions, your age, and other factors.  Have your blood pressure checked regularly, as often as told by your health care provider. Working with your health care provider  Review all the medicines you take with your health care provider because there may be side effects or interactions.  Talk with your health care provider about your diet, exercise habits, and other lifestyle factors that may be contributing to hypertension.  Visit your health care provider regularly. Your health care provider can help you create and adjust your plan for managing hypertension. Will I need medicine to control my blood pressure? Your health care provider may prescribe medicine if lifestyle changes are not enough to get your blood pressure under control, and if:  Your systolic blood pressure is 130 or higher.  Your diastolic blood pressure is 80 or higher. Take medicines only as told by your health care provider. Follow the directions carefully. Blood pressure medicines must be taken as prescribed. The medicine does not work as well when you skip doses. Skipping doses also puts you at risk for problems. Contact a health care provider if:  You think you are having a reaction to medicines you have taken.  You have repeated (recurrent) headaches.  You feel dizzy.  You have swelling in your ankles.  You have trouble with your vision. Get help right away if:  You develop a severe headache or confusion.  You have unusual weakness or numbness, or you feel faint.  You have severe pain in your chest or abdomen.  You vomit repeatedly.  You have trouble breathing. Summary  Hypertension is when the force of blood pumping  through your arteries is too strong. If this condition is not controlled, it may put you at risk for serious complications.  Your personal target blood pressure may vary depending on your medical conditions, your age, and other factors. For most people, a normal blood pressure is less than 120/80.  Hypertension is managed by lifestyle changes, medicines, or both. Lifestyle changes include weight loss, eating a healthy, low-sodium diet, exercising more, and limiting alcohol. This information is not intended to replace advice given to you by your health care provider. Make sure you discuss any questions you have with your health care provider. Document Revised: 02/04/2019 Document Reviewed: 09/10/2016 Elsevier Patient Education  2020 [place] Screening for Men A cancer screening is a test or exam that checks for cancer. Your health care provider will recommend specific cancer screenings based on your age, personal history, and family history of cancer. Work with your health care provider to create a cancer screening schedule that protects your health. Why is cancer screening done? Cancer screening is done to look for cancer in the very early stages, before it spreads and becomes harder to treat and before you would start to notice symptoms. Finding cancer early improves the chances of successful treatment. It may save your life. Who  should be screened for cancer? All men should be screened for colorectal cancer and skin cancer. Your health care provider may recommend screenings for other types of cancer if:  You had cancer before.  You have a family member with cancer.  You have abnormal genes that could increase the risk of cancer.  You have risk factors for certain cancers, such as smoking. When you should be screened for cancer depends on:  Your age.  Your medical history and your family's medical history.  Certain lifestyle factors, such as smoking.  Environmental  exposure, such as to asbestos. What are some common cancer screenings? Lung cancer Lung cancer screening is done with a CT scan that looks for abnormal cells in the lungs. Discuss lung cancer screening with your health care provider if you are 69-86 years old and if any of the following apply to you:  You currently smoke.  You used to smoke heavily.  You have a smoking history of 1 pack a day for 30 years or 2 packs a day for 15 years.  You have quit smoking within the past 15 years. If you smoke heavily or if you used to smoke, you may need to be screened every year. Prostate cancer Prostate cancer screening is done with blood tests and an exam in which a health care provider uses a gloved finger to check prostate size (digital rectal exam). You may need to be screened for prostate cancer if:  You have risk factors of prostate cancer, such as being African American or having a close family member with prostate cancer.  You have inherited gene changes or a genetic condition, including BRCA1 or BRCA2 gene mutations or Lynch syndrome.  You have symptoms of prostate cancer, such as problems urinating or erectile dysfunction. Prostate cancer screening for men with average risk may start at age 7. Men with risk factors may need to be screened earlier at age 5-45. Once you have been screened for prostate cancer, future screening may be recommended based on the results of your blood tests. Colorectal cancer  All adults should have screening for colorectal cancer starting at age 31 and continuing until age 13. Your health care provider may recommend screening at age 42. You will have tests every 1-10 years, depending on your results and the type of screening test. If you have a family history of colon or rectal cancer or other risk factors, you may need to start having screenings earlier. Talk with your health care provider about which screening test is right for you and how often you should be  screened. Colorectal cancer screening looks for cancer or for growths called polyps that often form before cancer starts. Tests to look for cancer or polyps include:  Colonoscopy or flexible sigmoidoscopy. For these procedures, a flexible tube with a small camera is inserted into the rectum.  CT colonography. This test uses X-rays and a contrast dye to check the colon for polyps. If a polyp is found, you may need to have a colonoscopy so the polyp can be located and removed. Tests to look for cancer in the stool (feces) include:  Guaiac-based fecal occult blood test (FOBT). This test detects blood in stool. It can be done at home with a kit.  Fecal immunochemical test (FIT). This test detects blood in stool. For this test, you will need to collect stool samples at home.  Stool DNA test. This test looks for blood in stool and any changes in DNA that can lead  to colon cancer. For this test, you will need to collect a stool sample at home and send it to a lab.  Skin cancer Skin cancer screening is done by checking the skin for unusual moles or spots and any changes in existing moles. Your health care provider should check your skin for signs of skin cancer at every physical exam. You should check your skin every month and tell your health care provider right away if anything looks unusual. Men with a higher-than-normal risk for skin cancer may want to see a skin specialist (dermatologist) for an annual body check. Where to find more information  National Cancer Institute: https://[place]-collins.com/  Centers for Disease Control and Prevention: uMourn.cz  American Cancer Society: https://www.cancer.org/latest-news/4-cancer-screening-tests-for-men.html Contact a health care provider if:  You have concerns about any signs or symptoms of cancer, such as: ? Moles that have an unusual shape or color. ? Changes in existing moles. ? A  sore on your skin that does not heal. ? Blood in your urine or stool. ? Fatigue that does not go away. ? Frequent pain or cramping in your abdomen. ? Coughing or trouble breathing that does not go away. ? Coughing up blood. ? Losing weight without trying. ? Changes in urination habits. ? Painful urination or ejaculation. Summary  Be aware of and watch for signs and symptoms of cancer, especially symptoms of lung cancer, prostate cancer, colorectal cancer, and skin cancer.  Early detection of cancer with cancer screening may save your life.  Talk with your health care provider about your specific cancer risks.  Work together with your health care provider to create a cancer screening plan that is right for you. This information is not intended to replace advice given to you by your health care provider. Make sure you discuss any questions you have with your health care provider. Document Revised: 07/02/2018 Document Reviewed: 07/10/2016 Elsevier Patient Education  2020 Elsevier Inc.  Diabetes Mellitus and Standards of Medical Care Managing diabetes (diabetes mellitus) can be complicated. Your diabetes treatment may be managed by a team of health care providers, including:  A physician who specializes in diabetes (endocrinologist).  A nurse practitioner or physician assistant.  Nurses.  A diet and nutrition specialist (registered dietitian).  A certified diabetes educator (CDE).  An exercise specialist.  A pharmacist.  An eye doctor.  A foot specialist (podiatrist).  A dentist.  A primary care provider.  A mental health provider. Your health care providers follow guidelines to help you get the best quality of care. The following schedule is a general guideline for your diabetes management plan. Your health care providers may give you more specific instructions. Physical exams Upon being diagnosed with diabetes mellitus, and each year after that, your health care  provider will ask about your medical and family history. He or she will also do a physical exam. Your exam may include:  Measuring your height, weight, and body mass index (BMI).  Checking your blood pressure. This will be done at every routine medical visit. Your target blood pressure may vary depending on your medical conditions, your age, and other factors.  Thyroid gland exam.  Skin exam.  Screening for damage to your nerves (peripheral neuropathy). This may include checking the pulse in your legs and feet and checking the level of sensation in your hands and feet.  A complete foot exam to inspect the structure and skin of your feet, including checking for cuts, bruises, redness, blisters, sores, or other problems.  Screening  for blood vessel (vascular) problems, which may include checking the pulse in your legs and feet and checking your temperature. Blood tests Depending on your treatment plan and your personal needs, you may have the following tests done:  HbA1c (hemoglobin A1c). This test provides information about blood sugar (glucose) control over the previous 2-3 months. It is used to adjust your treatment plan, if needed. This test will be done: ? At least 2 times a year, if you are meeting your treatment goals. ? 4 times a year, if you are not meeting your treatment goals or if treatment goals have changed.  Lipid testing, including total, LDL, and HDL cholesterol and triglyceride levels. ? The goal for LDL is less than 100 mg/dL (5.5 mmol/L). If you are at high risk for complications, the goal is less than 70 mg/dL (3.9 mmol/L). ? The goal for HDL is 40 mg/dL (2.2 mmol/L) or higher for men and 50 mg/dL (2.8 mmol/L) or higher for women. An HDL cholesterol of 60 mg/dL (3.3 mmol/L) or higher gives some protection against heart disease. ? The goal for triglycerides is less than 150 mg/dL (8.3 mmol/L).  Liver function tests.  Kidney function tests.  Thyroid function  tests. Dental and eye exams  Visit your dentist two times a year.  If you have type 1 diabetes, your health care provider may recommend an eye exam 3-5 years after you are diagnosed, and then once a year after your first exam. ? For children with type 1 diabetes, a health care provider may recommend an eye exam when your child is age 39 or older and has had diabetes for 3-5 years. After the first exam, your child should get an eye exam once a year.  If you have type 2 diabetes, your health care provider may recommend an eye exam as soon as you are diagnosed, and then once a year after your first exam. Immunizations   The yearly flu (influenza) vaccine is recommended for everyone 6 months or older who has diabetes.  The pneumonia (pneumococcal) vaccine is recommended for everyone 2 years or older who has diabetes. If you are 25 or older, you may get the pneumonia vaccine as a series of two separate shots.  The hepatitis B vaccine is recommended for adults shortly after being diagnosed with diabetes.  Adults and children with diabetes should receive all other vaccines according to age-specific recommendations from the Centers for Disease Control and Prevention (CDC). Mental and emotional health Screening for symptoms of eating disorders, anxiety, and depression is recommended at the time of diagnosis and afterward as needed. If your screening shows that you have symptoms (positive screening result), you may need more evaluation and you may work with a mental health care provider. Treatment plan Your treatment plan will be reviewed at every medical visit. You and your health care provider will discuss:  How you are taking your medicines, including insulin.  Any side effects you are experiencing.  Your blood glucose target goals.  The frequency of your blood glucose monitoring.  Lifestyle habits, such as activity level as well as tobacco, alcohol, and substance use. Diabetes  self-management education Your health care provider will assess how well you are monitoring your blood glucose levels and whether you are taking your insulin correctly. He or she may refer you to:  A certified diabetes educator to manage your diabetes throughout your life, starting at diagnosis.  A registered dietitian who can create or review your personal nutrition plan.  An  exercise specialist who can discuss your activity level and exercise plan. Summary  Managing diabetes (diabetes mellitus) can be complicated. Your diabetes treatment may be managed by a team of health care providers.  Your health care providers follow guidelines in order to help you get the best quality of care.  Standards of care including having regular physical exams, blood tests, blood pressure monitoring, immunizations, screening tests, and education about how to manage your diabetes.  Your health care providers may also give you more specific instructions based on your individual health. This information is not intended to replace advice given to you by your health care provider. Make sure you discuss any questions you have with your health care provider. Document Revised: 07/02/2018 Document Reviewed: 07/11/2016 Elsevier Patient Education  [place].

## 2020-05-21 ENCOUNTER — Ambulatory Visit: Payer: Medicare HMO

## 2020-06-13 DIAGNOSIS — H40053 Ocular hypertension, bilateral: Secondary | ICD-10-CM | POA: Diagnosis not present

## 2020-06-13 DIAGNOSIS — E113213 Type 2 diabetes mellitus with mild nonproliferative diabetic retinopathy with macular edema, bilateral: Secondary | ICD-10-CM | POA: Diagnosis not present

## 2020-06-13 DIAGNOSIS — Z794 Long term (current) use of insulin: Secondary | ICD-10-CM | POA: Diagnosis not present

## 2020-06-15 ENCOUNTER — Other Ambulatory Visit: Payer: Self-pay | Admitting: Family Medicine

## 2020-06-18 ENCOUNTER — Other Ambulatory Visit: Payer: Self-pay

## 2020-06-18 ENCOUNTER — Encounter: Payer: Self-pay | Admitting: Family Medicine

## 2020-06-18 ENCOUNTER — Ambulatory Visit: Payer: Medicare HMO | Admitting: Family Medicine

## 2020-06-18 ENCOUNTER — Ambulatory Visit (INDEPENDENT_AMBULATORY_CARE_PROVIDER_SITE_OTHER): Payer: Medicare HMO | Admitting: Family Medicine

## 2020-06-18 VITALS — BP 138/70 | HR 82 | Temp 97.7°F | Wt 156.0 lb

## 2020-06-18 DIAGNOSIS — R972 Elevated prostate specific antigen [PSA]: Secondary | ICD-10-CM | POA: Diagnosis not present

## 2020-06-18 DIAGNOSIS — I10 Essential (primary) hypertension: Secondary | ICD-10-CM

## 2020-06-18 DIAGNOSIS — N471 Phimosis: Secondary | ICD-10-CM | POA: Diagnosis not present

## 2020-06-18 DIAGNOSIS — E1142 Type 2 diabetes mellitus with diabetic polyneuropathy: Secondary | ICD-10-CM

## 2020-06-18 DIAGNOSIS — G4700 Insomnia, unspecified: Secondary | ICD-10-CM

## 2020-06-18 MED ORDER — INSULIN PEN NEEDLE 31G X 8 MM MISC: 3 refills | Status: DC

## 2020-06-18 NOTE — Progress Notes (Signed)
Subjective:    Patient ID: [name], male    DOB: February 15, 1951, 69 y.o.   MRN: [id number]  No chief complaint on file. Pt accompanied by his significant other, [name].  HPI Patient was seen today for f/u.  Pt states he has been feeling fine.  Pt has been checking fsbs and bp at home daily.   Taking norvasc 5 mg and losartan 100 mg daily.  BP 106/62, 125/75, 161/69, 139/71, 120/80, 138/79, 127/72.  Fsbs 176, 156, 120, 163, 138, 135, 145, 157, 245, 101, 27.  Pt taking Novolin 70/30 18 units twice daily.  Was being seen by endocrinology but has not had a recent follow-up.  Pt states current lancets do not go deep enough to draw blood.  Per pt's significant other, he was recently diagnosed with retinopathy by eye dr.?  Has upcoming follow-up.  Notes h/o difficulty sleeping x yrs.  Sets his own work schedule so able to sleep in if needed.  Past Medical History:  Diagnosis Date  . Diabetes mellitus without complication ([place])   . Insomnia     No Known Allergies  ROS General: Denies fever, chills, night sweats, changes in weight, changes in appetite HEENT: Denies headaches, ear pain, changes in vision, rhinorrhea, sore throat CV: Denies CP, palpitations, SOB, orthopnea Pulm: Denies SOB, cough, wheezing GI: Denies abdominal pain, nausea, vomiting, diarrhea, constipation GU: Denies dysuria, hematuria, frequency, vaginal discharge Msk: Denies muscle cramps, joint pains Neuro: Denies weakness, numbness, tingling Skin: Denies rashes, bruising Psych: Denies depression, anxiety, hallucinations    Objective:    Blood pressure 138/70, pulse 82, temperature 97.7 F (36.5 C), temperature source Oral, weight 156 lb (70.8 kg), SpO2 96 %.  Gen. Pleasant, well-nourished, in no distress, normal affect   HEENT: [place]/AT, face symmetric, conjunctiva clear, no scleral icterus, PERRLA, EOMI, nares patent without drainage Lungs: no accessory muscle use Cardiovascular: RRR, no peripheral  edema Musculoskeletal: No deformities, no cyanosis or clubbing, normal tone Neuro:  A&Ox3, CN II-XII intact, normal gait Skin:  Warm, no lesions/ rash.  Calluses on bilateral hands.   Wt Readings from Last 3 Encounters:  06/18/20 156 lb (70.8 kg)  05/18/20 155 lb (70.3 kg)  05/04/20 153 lb 4 oz (69.5 kg)    Lab Results  Component Value Date   WBC 3.3 (L) 11/10/2018   HGB 12.9 (L) 11/10/2018   HCT 36.3 (L) 11/10/2018   PLT 332.0 11/10/2018   GLUCOSE 209 (H) 02/21/2020   CHOL 101 02/21/2020   TRIG 58.0 02/21/2020   HDL 34.10 (L) 02/21/2020   LDLCALC 55 02/21/2020   ALT 29 02/21/2020   AST 30 02/21/2020   NA 143 02/21/2020   K 4.8 02/21/2020   CL 103 02/21/2020   CREATININE 1.27 02/21/2020   BUN 28 (H) 02/21/2020   CO2 34 (H) 02/21/2020   TSH 0.78 11/10/2018   PSA 6.49 (H) 03/21/2020   HGBA1C 7.0 (A) 03/22/2020   MICROALBUR 13.6 (H) 11/10/2018    Assessment/Plan:  Type 2 diabetes mellitus with diabetic polyneuropathy, without long-term current use of insulin ([place])  -Patient currently taking Novolin 70/30 18 units twice daily.  Discussed increasing dosing to 20 units twice daily -Discussed using a different type of lancets given calluses on fingers making it difficult to draw blood. -Patient encouraged to follow-up with endocrinology -Recent retinopathy diagnosis?  Referral to Ophtho placed 05/18/20, encouraged to keep appt. - Plan: Insulin Pen Needle 31G X 8 MM MISC  Essential hypertension -Improving -Continue Norvasc 5 mg  and losartan 100 mg -Continue lifestyle modifications -Continue checking BP at home and keep a log to bring with you to clinic.  Insomnia, unspecified type -Discussed sleep hygiene -Consider melatonin -We will reevaluate at next OFV  F/u in 1 month  Grier Mitts, MD

## 2020-06-18 NOTE — Patient Instructions (Signed)
Diabetic Retinopathy Diabetic retinopathy is a disease of the retina. The retina is a light-sensitive membrane at the back of the eye. Retinopathy is a complication of diabetes (diabetes mellitus) and a common cause of bad eyesight (visual impairment). It can eventually cause blindness. Early detection and treatment of diabetic retinopathy is important in keeping your eyes healthy and preventing further damage to them. What are the causes? Diabetic retinopathy is caused by blood sugar (glucose) levels that are too high for an extended period of time. High blood glucose over an extended period of time can:  Damage small blood vessels in the retina, allowing blood to leak through the vessel walls.  Cause new, abnormal blood vessels to grow on the retina. This can scar the retina in the advanced stage of diabetic retinopathy. What increases the risk? You are more likely to develop this condition if:  You have had diabetes for a long time.  You have poorly controlled blood glucose.  You have high blood pressure. What are the signs or symptoms? In the early stages of diabetic retinopathy, there are often no symptoms. As the condition gets worse, symptoms may include:  Blurred vision. This is usually caused by swelling due to abnormal blood glucose levels. The blurriness may go away when blood glucose levels return to normal.  Moving specks or dark spots (floaters) in your vision. These can be caused by a small amount of bleeding (hemorrhage) from retinal blood vessels.  Missing parts of your field of vision, such as vision at the sides of the eyes. This can be caused by larger retinal hemorrhages.  Difficulty reading.  Double vision.  Pain in one or both eyes.  Feeling pressure in one or both eyes.  Trouble seeing straight lines. Straight lines may not look straight.  Redness of the eyes that does not go away. How is this diagnosed? This condition may be diagnosed with an eye exam in  which your eye care specialist puts drops in your eyes that enlarge (dilate) your pupils. This lets your health care provider examine your retina and check for changes in your retinal blood vessels. How is this treated? This condition may be treated by:  Keeping your blood glucose and blood pressure within a target range.  Using a type of laser beam to seal your retinal blood vessels. This stops them from bleeding and decreases pressure in your eye.  Getting shots of medicine in the eye to reduce swelling of the center of the retina (macula). You may be given: ? Anti-VEGF medicine. This medicine can help slow vision loss, and may even improve vision. ? Steroid medicine. Follow these instructions at home:   Follow your diabetes management plan as directed by your health care provider. This may include exercising regularly and eating a healthy diet.  Keep your blood glucose level and your blood pressure in your target range, as directed by your health care provider.  Check your blood glucose as often as directed.  Take over the counter and prescription medicines only as told by your health care provider. This includes insulin and oral diabetes medicine.  Get your eyes checked at least once every year. An eye specialist can usually see diabetic retinopathy developing long before it starts to cause problems. In many cases, it can be treated to prevent complications from occurring.  Do not use any products that contain nicotine or tobacco, such as cigarettes and e-cigarettes. If you need help quitting, ask your health care provider.  Keep all follow-up   visits as told by your health care provider. This is important. Contact a health care provider if:  You notice gradual blurring or other changes in your vision over time.  You notice that your glasses or contact lenses do not make things look as sharp as they once did.  You have trouble reading or seeing details at a distance with either  eye.  You notice a change in your vision or notice that parts of your field of vision appear missing or hazy.  You suddenly see moving specks or dark spots in the field of vision of either eye. Get help right away if:  You have sudden pain or pressure in one or both eyes.  You suddenly lose vision or a curtain or veil seems to come across your eyes.  You have a sudden burst of floaters in your vision. Summary  Diabetic retinopathy is a disease of the retina. The retina is a light-sensitive membrane at the back of the eye. Retinopathy is a complication of diabetes.  Get your eyes checked at least once every year. An eye specialist can usually see diabetic retinopathy developing long before it starts to cause problems. In many cases, it can be treated to prevent complications from occurring.  Keep your blood glucose and your blood pressure in target range. Follow your diabetes management plan as directed by your health care provider.  Protect your eyes. Wear sunglasses and eye protection when needed. This information is not intended to replace advice given to you by your health care provider. Make sure you discuss any questions you have with your health care provider. Document Revised: 11/25/2017 Document Reviewed: 11/17/2016 Elsevier Patient Education  [place].  Insomnia Insomnia is a sleep disorder that makes it difficult to fall asleep or stay asleep. Insomnia can cause fatigue, low energy, difficulty concentrating, mood swings, and poor performance at work or school. There are three different ways to classify insomnia:  Difficulty falling asleep.  Difficulty staying asleep.  Waking up too early in the morning. Any type of insomnia can be long-term (chronic) or short-term (acute). Both are common. Short-term insomnia usually lasts for three months or less. Chronic insomnia occurs at least three times a week for longer than three months. What are the causes? Insomnia  may be caused by another condition, situation, or substance, such as:  Anxiety.  Certain medicines.  Gastroesophageal reflux disease (GERD) or other gastrointestinal conditions.  Asthma or other breathing conditions.  Restless legs syndrome, sleep apnea, or other sleep disorders.  Chronic pain.  Menopause.  Stroke.  Abuse of alcohol, tobacco, or illegal drugs.  Mental health conditions, such as depression.  Caffeine.  Neurological disorders, such as Alzheimer's disease.  An overactive thyroid (hyperthyroidism). Sometimes, the cause of insomnia may not be known. What increases the risk? Risk factors for insomnia include:  Gender. Women are affected more often than men.  Age. Insomnia is more common as you get older.  Stress.  Lack of exercise.  Irregular work schedule or working night shifts.  Traveling between different time zones.  Certain medical and mental health conditions. What are the signs or symptoms? If you have insomnia, the main symptom is having trouble falling asleep or having trouble staying asleep. This may lead to other symptoms, such as:  Feeling fatigued or having low energy.  Feeling nervous about going to sleep.  Not feeling rested in the morning.  Having trouble concentrating.  Feeling irritable, anxious, or depressed. How is this diagnosed? This condition  may be diagnosed based on:  Your symptoms and medical history. Your health care provider may ask about: ? Your sleep habits. ? Any medical conditions you have. ? Your mental health.  A physical exam. How is this treated? Treatment for insomnia depends on the cause. Treatment may focus on treating an underlying condition that is causing insomnia. Treatment may also include:  Medicines to help you sleep.  Counseling or therapy.  Lifestyle adjustments to help you sleep better. Follow these instructions at home: Eating and drinking   Limit or avoid alcohol, caffeinated  beverages, and cigarettes, especially close to bedtime. These can disrupt your sleep.  Do not eat a large meal or eat spicy foods right before bedtime. This can lead to digestive discomfort that can make it hard for you to sleep. Sleep habits   Keep a sleep diary to help you and your health care provider figure out what could be causing your insomnia. Write down: ? When you sleep. ? When you wake up during the night. ? How well you sleep. ? How rested you feel the next day. ? Any side effects of medicines you are taking. ? What you eat and drink.  Make your bedroom a dark, comfortable place where it is easy to fall asleep. ? Put up shades or blackout curtains to block light from outside. ? Use a white noise machine to block noise. ? Keep the temperature cool.  Limit screen use before bedtime. This includes: ? Watching TV. ? Using your smartphone, tablet, or computer.  Stick to a routine that includes going to bed and waking up at the same times every day and night. This can help you fall asleep faster. Consider making a quiet activity, such as reading, part of your nighttime routine.  Try to avoid taking naps during the day so that you sleep better at night.  Get out of bed if you are still awake after 15 minutes of trying to sleep. Keep the lights down, but try reading or doing a quiet activity. When you feel sleepy, go back to bed. General instructions  Take over-the-counter and prescription medicines only as told by your health care provider.  Exercise regularly, as told by your health care provider. Avoid exercise starting several hours before bedtime.  Use relaxation techniques to manage stress. Ask your health care provider to suggest some techniques that may work well for you. These may include: ? Breathing exercises. ? Routines to release muscle tension. ? Visualizing peaceful scenes.  Make sure that you drive carefully. Avoid driving if you feel very sleepy.  Keep  all follow-up visits as told by your health care provider. This is important. Contact a health care provider if:  You are tired throughout the day.  You have trouble in your daily routine due to sleepiness.  You continue to have sleep problems, or your sleep problems get worse. Get help right away if:  You have serious thoughts about hurting yourself or someone else. If you ever feel like you may hurt yourself or others, or have thoughts about taking your own life, get help right away. You can go to your nearest emergency department or call:  Your local emergency services (911 in the [place]  A suicide crisis helpline, such as the [facility] at (541)255-8917. This is open 24 hours a day. Summary  Insomnia is a sleep disorder that makes it difficult to fall asleep or stay asleep.  Insomnia can be long-term (chronic) or short-term (acute).  Treatment for insomnia depends on the cause. Treatment may focus on treating an underlying condition that is causing insomnia.  Keep a sleep diary to help you and your health care provider figure out what could be causing your insomnia. This information is not intended to replace advice given to you by your health care provider. Make sure you discuss any questions you have with your health care provider. Document Revised: 09/25/2017 Document Reviewed: 07/23/2017 Elsevier Patient Education  2020 [employer].  Managing Your Hypertension Hypertension is commonly called high blood pressure. This is when the force of your blood pressing against the walls of your arteries is too strong. Arteries are blood vessels that carry blood from your heart throughout your body. Hypertension forces the heart to work harder to pump blood, and may cause the arteries to become narrow or stiff. Having untreated or uncontrolled hypertension can cause heart attack, stroke, kidney disease, and other problems. What are blood pressure readings? A  blood pressure reading consists of a higher number over a lower number. Ideally, your blood pressure should be below 120/80. The first ("top") number is called the systolic pressure. It is a measure of the pressure in your arteries as your heart beats. The second ("bottom") number is called the diastolic pressure. It is a measure of the pressure in your arteries as the heart relaxes. What does my blood pressure reading mean? Blood pressure is classified into four stages. Based on your blood pressure reading, your health care provider may use the following stages to determine what type of treatment you need, if any. Systolic pressure and diastolic pressure are measured in a unit called mm Hg. Normal  Systolic pressure: below 742.  Diastolic pressure: below 80. Elevated  Systolic pressure: 595-638.  Diastolic pressure: below 80. Hypertension stage 1  Systolic pressure: 756-433.  Diastolic pressure: 29-51. Hypertension stage 2  Systolic pressure: 884 or above.  Diastolic pressure: 90 or above. What health risks are associated with hypertension? Managing your hypertension is an important responsibility. Uncontrolled hypertension can lead to:  A heart attack.  A stroke.  A weakened blood vessel (aneurysm).  Heart failure.  Kidney damage.  Eye damage.  Metabolic syndrome.  Memory and concentration problems. What changes can I make to manage my hypertension? Hypertension can be managed by making lifestyle changes and possibly by taking medicines. Your health care provider will help you make a plan to bring your blood pressure within a normal range. Eating and drinking   Eat a diet that is high in fiber and potassium, and low in salt (sodium), added sugar, and fat. An example eating plan is called the DASH (Dietary Approaches to Stop Hypertension) diet. To eat this way: ? Eat plenty of fresh fruits and vegetables. Try to fill half of your plate at each meal with fruits and  vegetables. ? Eat whole grains, such as whole wheat pasta, brown rice, or whole grain bread. Fill about one quarter of your plate with whole grains. ? Eat low-fat diary products. ? Avoid fatty cuts of meat, processed or cured meats, and poultry with skin. Fill about one quarter of your plate with lean proteins such as fish, chicken without skin, beans, eggs, and tofu. ? Avoid premade and processed foods. These tend to be higher in sodium, added sugar, and fat.  Reduce your daily sodium intake. Most people with hypertension should eat less than 1,500 mg of sodium a day.  Limit alcohol intake to no more than 1 drink a day for  nonpregnant women and 2 drinks a day for men. One drink equals 12 oz of beer, 5 oz of wine, or 1 oz of hard liquor. Lifestyle  Work with your health care provider to maintain a healthy body weight, or to lose weight. Ask what an ideal weight is for you.  Get at least 30 minutes of exercise that causes your heart to beat faster (aerobic exercise) most days of the week. Activities may include walking, swimming, or biking.  Include exercise to strengthen your muscles (resistance exercise), such as weight lifting, as part of your weekly exercise routine. Try to do these types of exercises for 30 minutes at least 3 days a week.  Do not use any products that contain nicotine or tobacco, such as cigarettes and e-cigarettes. If you need help quitting, ask your health care provider.  Control any long-term (chronic) conditions you have, such as high cholesterol or diabetes. Monitoring  Monitor your blood pressure at home as told by your health care provider. Your personal target blood pressure may vary depending on your medical conditions, your age, and other factors.  Have your blood pressure checked regularly, as often as told by your health care provider. Working with your health care provider  Review all the medicines you take with your health care provider because there may  be side effects or interactions.  Talk with your health care provider about your diet, exercise habits, and other lifestyle factors that may be contributing to hypertension.  Visit your health care provider regularly. Your health care provider can help you create and adjust your plan for managing hypertension. Will I need medicine to control my blood pressure? Your health care provider may prescribe medicine if lifestyle changes are not enough to get your blood pressure under control, and if:  Your systolic blood pressure is 130 or higher.  Your diastolic blood pressure is 80 or higher. Take medicines only as told by your health care provider. Follow the directions carefully. Blood pressure medicines must be taken as prescribed. The medicine does not work as well when you skip doses. Skipping doses also puts you at risk for problems. Contact a health care provider if:  You think you are having a reaction to medicines you have taken.  You have repeated (recurrent) headaches.  You feel dizzy.  You have swelling in your ankles.  You have trouble with your vision. Get help right away if:  You develop a severe headache or confusion.  You have unusual weakness or numbness, or you feel faint.  You have severe pain in your chest or abdomen.  You vomit repeatedly.  You have trouble breathing. Summary  Hypertension is when the force of blood pumping through your arteries is too strong. If this condition is not controlled, it may put you at risk for serious complications.  Your personal target blood pressure may vary depending on your medical conditions, your age, and other factors. For most people, a normal blood pressure is less than 120/80.  Hypertension is managed by lifestyle changes, medicines, or both. Lifestyle changes include weight loss, eating a healthy, low-sodium diet, exercising more, and limiting alcohol. This information is not intended to replace advice given to you by  your health care provider. Make sure you discuss any questions you have with your health care provider. Document Revised: 02/04/2019 Document Reviewed: 09/10/2016 Elsevier Patient Education  [place].

## 2020-06-22 ENCOUNTER — Telehealth: Payer: Self-pay | Admitting: Family Medicine

## 2020-06-22 DIAGNOSIS — R69 Illness, unspecified: Secondary | ICD-10-CM | POA: Diagnosis not present

## 2020-06-22 NOTE — Telephone Encounter (Signed)
Pt is needing a longer needle for meter. They sent a longer needle for his insuline. He needs it for when he sticks his fingers.  Please advise

## 2020-06-22 NOTE — Telephone Encounter (Signed)
Called pt back left a message to call the office back, spoke with pt pharmacy not aware what pt is referring to. Will try again

## 2020-06-24 ENCOUNTER — Encounter: Payer: Self-pay | Admitting: Family Medicine

## 2020-07-01 ENCOUNTER — Other Ambulatory Visit: Payer: Self-pay | Admitting: Family Medicine

## 2020-07-01 DIAGNOSIS — E1165 Type 2 diabetes mellitus with hyperglycemia: Secondary | ICD-10-CM

## 2020-07-04 DIAGNOSIS — R69 Illness, unspecified: Secondary | ICD-10-CM | POA: Diagnosis not present

## 2020-07-04 NOTE — Telephone Encounter (Signed)
Left a message for pt to call the office regarding refills for his lancets

## 2020-07-09 ENCOUNTER — Telehealth: Payer: Self-pay | Admitting: Family Medicine

## 2020-07-09 NOTE — Telephone Encounter (Signed)
Tia from [employer] [facility] is requesting a colorguard kit be mailed out to the pt.  Please advise

## 2020-07-13 ENCOUNTER — Encounter: Payer: Self-pay | Admitting: Family Medicine

## 2020-07-13 ENCOUNTER — Telehealth: Payer: Self-pay | Admitting: Family Medicine

## 2020-07-13 DIAGNOSIS — E113411 Type 2 diabetes mellitus with severe nonproliferative diabetic retinopathy with macular edema, right eye: Secondary | ICD-10-CM | POA: Diagnosis not present

## 2020-07-13 DIAGNOSIS — I6523 Occlusion and stenosis of bilateral carotid arteries: Secondary | ICD-10-CM | POA: Diagnosis not present

## 2020-07-13 DIAGNOSIS — H35033 Hypertensive retinopathy, bilateral: Secondary | ICD-10-CM | POA: Diagnosis not present

## 2020-07-13 DIAGNOSIS — E113211 Type 2 diabetes mellitus with mild nonproliferative diabetic retinopathy with macular edema, right eye: Secondary | ICD-10-CM | POA: Insufficient documentation

## 2020-07-13 DIAGNOSIS — H2513 Age-related nuclear cataract, bilateral: Secondary | ICD-10-CM | POA: Diagnosis not present

## 2020-07-13 DIAGNOSIS — E11311 Type 2 diabetes mellitus with unspecified diabetic retinopathy with macular edema: Secondary | ICD-10-CM | POA: Insufficient documentation

## 2020-07-13 LAB — HM DIABETES EYE EXAM

## 2020-07-13 NOTE — Telephone Encounter (Signed)
Py was notified that he will receive the kit in the mail

## 2020-07-13 NOTE — Telephone Encounter (Signed)
Pt state that Diabetes supplies have been refilled and nothing further needed

## 2020-07-13 NOTE — Telephone Encounter (Signed)
Pt  cologuard order was faxed to [facility] on  07/13/2020

## 2020-07-13 NOTE — Telephone Encounter (Signed)
FYI Spoke with pt spouse Ms.Tamala Julian who is listed on pt DPR regarding pt Elevated BP, advised for pt to go to the ED or UC since Dr Volanda Napoleon is out of the office and there is no Dr at our office with opening to see pt today due to schedules being full, Verbalized understanding and state that she will take pt to [facility] today. Advised to call our office with update of the visit for Dr Volanda Napoleon to review.

## 2020-07-13 NOTE — Telephone Encounter (Signed)
Pts spouse is calling in stating that he went to Dr. Manuella Ghazi today and his BP 200/103 and Dr. Manuella Ghazi is sending him to have a [place] on his neck sometime next week.  Pts spouse would like to have a call back today to let her know if the pt should increase his medication.

## 2020-07-24 ENCOUNTER — Other Ambulatory Visit: Payer: Self-pay

## 2020-07-25 ENCOUNTER — Encounter: Payer: Self-pay | Admitting: Family Medicine

## 2020-07-25 ENCOUNTER — Ambulatory Visit (INDEPENDENT_AMBULATORY_CARE_PROVIDER_SITE_OTHER): Payer: Medicare HMO | Admitting: Family Medicine

## 2020-07-25 VITALS — BP 180/90 | HR 85 | Temp 97.8°F | Ht 66.0 in | Wt 156.8 lb

## 2020-07-25 DIAGNOSIS — G4700 Insomnia, unspecified: Secondary | ICD-10-CM

## 2020-07-25 DIAGNOSIS — I10 Essential (primary) hypertension: Secondary | ICD-10-CM | POA: Diagnosis not present

## 2020-07-25 DIAGNOSIS — R0683 Snoring: Secondary | ICD-10-CM | POA: Diagnosis not present

## 2020-07-25 DIAGNOSIS — Z794 Long term (current) use of insulin: Secondary | ICD-10-CM | POA: Diagnosis not present

## 2020-07-25 DIAGNOSIS — E1142 Type 2 diabetes mellitus with diabetic polyneuropathy: Secondary | ICD-10-CM | POA: Diagnosis not present

## 2020-07-25 LAB — COLOGUARD

## 2020-07-25 MED ORDER — AMLODIPINE BESYLATE 10 MG PO TABS: 10.0000 mg | ORAL_TABLET | Freq: Every day | ORAL | 3 refills | Status: DC

## 2020-07-25 NOTE — Progress Notes (Signed)
Subjective:    Patient ID: [name], male    DOB: 11-Mar-1951, 69 y.o.   MRN: [id number]  No chief complaint on file. Pt accompanied by his significant other, [name].  HPI Patient was seen today for f/u.  Pt states his blood pressure has been up and down at home systolic 400-867.  Taking Norvasc 5 mg and losartan 100 mg.  Pt notes BP was elevated at ophthalmology visit.  States had not taken his meds at time of ophthalmology visit.  Dx'd with severe nonproliferative diabetic retinopathy of right eye, hypertensive retinopathy bilaterally, and bilateral cataracts.  Referral was placed for bilateral carotid artery ultrasound.  Pt endorses snoring at night.  Still having trouble with insomnia.  May go to bed around midnight-1 PM but wakes up at 3 AM.  Will take naps during the day.  Has been told he snores at night.  FSBS was 130 this am.  Pt states new lancets are working better.  Drinking mostly water and not adding salt to food.  Patient taking Novolin 70/30 18 units.  Followed by Endo, Dr. Kelton [name].  Pt denies headaches, changes in vision, chest pain, SOB.  Past Medical History:  Diagnosis Date  . Diabetes mellitus without complication ([place])   . Insomnia     No Known Allergies  ROS General: Denies fever, chills, night sweats, changes in weight, changes in appetite  +insomnia HEENT: Denies headaches, ear pain, changes in vision, rhinorrhea, sore throat CV: Denies CP, palpitations, SOB, orthopnea  +snoring Pulm: Denies SOB, cough, wheezing GI: Denies abdominal pain, nausea, vomiting, diarrhea, constipation GU: Denies dysuria, hematuria, frequency, vaginal discharge Msk: Denies muscle cramps, joint pains Neuro: Denies weakness, numbness, tingling Skin: Denies rashes, bruising Psych: Denies depression, anxiety, hallucinations  Objective:    Blood pressure (!) 160/88, pulse 85, temperature 97.8 F (36.6 C), temperature source Oral, height 5\' 6"  (1.676 m), weight 156 lb 12.8  oz (71.1 kg), SpO2 96 %. BP recheck 180/90 Gen. Pleasant, well-nourished, in no distress, normal affect   HEENT: [name], face symmetric, conjunctiva clear, no scleral icterus, PERRLA, EOMI, nares patent without drainage Lungs: no accessory muscle use, CTAB, no wheezes or rales Cardiovascular: RRR, no m/r/g, no peripheral edema Musculoskeletal: No deformities, no cyanosis or clubbing, normal tone Neuro:  A&Ox3, CN II-XII intact, normal gait Skin:  Warm, no lesions/ rash, calluses on bilateral hands   Wt Readings from Last 3 Encounters:  07/25/20 156 lb 12.8 oz (71.1 kg)  06/18/20 156 lb (70.8 kg)  05/18/20 155 lb (70.3 kg)    Lab Results  Component Value Date   WBC 3.3 (L) 11/10/2018   HGB 12.9 (L) 11/10/2018   HCT 36.3 (L) 11/10/2018   PLT 332.0 11/10/2018   GLUCOSE 209 (H) 02/21/2020   CHOL 101 02/21/2020   TRIG 58.0 02/21/2020   HDL 34.10 (L) 02/21/2020   LDLCALC 55 02/21/2020   ALT 29 02/21/2020   AST 30 02/21/2020   NA 143 02/21/2020   K 4.8 02/21/2020   CL 103 02/21/2020   CREATININE 1.27 02/21/2020   BUN 28 (H) 02/21/2020   CO2 34 (H) 02/21/2020   TSH 0.78 11/10/2018   PSA 6.49 (H) 03/21/2020   HGBA1C 7.0 (A) 03/22/2020   MICROALBUR 13.6 (H) 11/10/2018    Assessment/Plan:  Essential hypertension -Uncontrolled -Repeat 180/90 by this provider -Discussed increasing Norvasc from 5 to 10 mg -We will continue losartan 100 mg -Continue lifestyle modifications -We will obtain sleep study -Encouraged to keep appointment for bilateral  carotid ultrasound -Continue checking BP at home and keeping a log to bring to clinic as well as her BP cuff - Plan: Home sleep test, amLODipine (NORVASC) 10 MG tablet  Type 2 diabetes mellitus with diabetic polyneuropathy, with long-term current use of insulin (HCC) -Last hemoglobin A1c 7.0% on 03/22/2020 -Continue 20 units of Novolin 70/30 at breakfast and 20 units with dinner.  (rx'd as 18 and 22 units, but pt taking as  above) -Diabetic retinopathy noted on recent ophthalmology exam.  Continue follow-up with ophthalmology -Continue follow-up with endocrinology, [name]  Snores -Discussed OSA can contribute to elevated BP -We will obtain sleep study  - Plan: Home sleep test  Insomnia, unspecified type  -sleep hygeine - Plan: Home sleep test  F/u in 1 month, sooner if needed  [name] Mitts, MD

## 2020-07-25 NOTE — Patient Instructions (Addendum)
Today we talked about increasing your blood pressure medicine, amlodipine, from 5 mg to 10 mg.  You can take 2 of your 5 mg pills that you have at home then pick up the prescription for the increased dose.  You should also continue taking losartan 100 mg daily.  Remember to write down your blood pressure readings and bring your cuff to your next appointment in 1 month.   Hypertension, Adult High blood pressure (hypertension) is when the force of blood pumping through the arteries is too strong. The arteries are the blood vessels that carry blood from the heart throughout the body. Hypertension forces the heart to work harder to pump blood and may cause arteries to become narrow or stiff. Untreated or uncontrolled hypertension can cause a heart attack, heart failure, a stroke, kidney disease, and other problems. A blood pressure reading consists of a higher number over a lower number. Ideally, your blood pressure should be below 120/80. The first ("top") number is called the systolic pressure. It is a measure of the pressure in your arteries as your heart beats. The second ("bottom") number is called the diastolic pressure. It is a measure of the pressure in your arteries as the heart relaxes. What are the causes? The exact cause of this condition is not known. There are some conditions that result in or are related to high blood pressure. What increases the risk? Some risk factors for high blood pressure are under your control. The following factors may make you more likely to develop this condition:  Smoking.  Having type 2 diabetes mellitus, high cholesterol, or both.  Not getting enough exercise or physical activity.  Being overweight.  Having too much fat, sugar, calories, or salt (sodium) in your diet.  Drinking too much alcohol. Some risk factors for high blood pressure may be difficult or impossible to change. Some of these factors include:  Having chronic kidney disease.  Having a  family history of high blood pressure.  Age. Risk increases with age.  Race. You may be at higher risk if you are African American.  Gender. Men are at higher risk than women before age 60. After age 59, women are at higher risk than men.  Having obstructive sleep apnea.  Stress. What are the signs or symptoms? High blood pressure may not cause symptoms. Very high blood pressure (hypertensive crisis) may cause:  Headache.  Anxiety.  Shortness of breath.  Nosebleed.  Nausea and vomiting.  Vision changes.  Severe chest pain.  Seizures. How is this diagnosed? This condition is diagnosed by measuring your blood pressure while you are seated, with your arm resting on a flat surface, your legs uncrossed, and your feet flat on the floor. The cuff of the blood pressure monitor will be placed directly against the skin of your upper arm at the level of your heart. It should be measured at least twice using the same arm. Certain conditions can cause a difference in blood pressure between your right and left arms. Certain factors can cause blood pressure readings to be lower or higher than normal for a short period of time:  When your blood pressure is higher when you are in a health care provider's office than when you are at home, this is called white coat hypertension. Most people with this condition do not need medicines.  When your blood pressure is higher at home than when you are in a health care provider's office, this is called masked hypertension. Most people with  this condition may need medicines to control blood pressure. If you have a high blood pressure reading during one visit or you have normal blood pressure with other risk factors, you may be asked to:  Return on a different day to have your blood pressure checked again.  Monitor your blood pressure at home for 1 week or longer. If you are diagnosed with hypertension, you may have other blood or imaging tests to help  your health care provider understand your overall risk for other conditions. How is this treated? This condition is treated by making healthy lifestyle changes, such as eating healthy foods, exercising more, and reducing your alcohol intake. Your health care provider may prescribe medicine if lifestyle changes are not enough to get your blood pressure under control, and if:  Your systolic blood pressure is above 130.  Your diastolic blood pressure is above 80. Your personal target blood pressure may vary depending on your medical conditions, your age, and other factors. Follow these instructions at home: Eating and drinking   Eat a diet that is high in fiber and potassium, and low in sodium, added sugar, and fat. An example eating plan is called the DASH (Dietary Approaches to Stop Hypertension) diet. To eat this way: ? Eat plenty of fresh fruits and vegetables. Try to fill one half of your plate at each meal with fruits and vegetables. ? Eat whole grains, such as whole-wheat pasta, brown rice, or whole-grain bread. Fill about one fourth of your plate with whole grains. ? Eat or drink low-fat dairy products, such as skim milk or low-fat yogurt. ? Avoid fatty cuts of meat, processed or cured meats, and poultry with skin. Fill about one fourth of your plate with lean proteins, such as fish, chicken without skin, beans, eggs, or tofu. ? Avoid pre-made and processed foods. These tend to be higher in sodium, added sugar, and fat.  Reduce your daily sodium intake. Most people with hypertension should eat less than 1,500 mg of sodium a day.  Do not drink alcohol if: ? Your health care provider tells you not to drink. ? You are pregnant, may be pregnant, or are planning to become pregnant.  If you drink alcohol: ? Limit how much you use to:  0-1 drink a day for women.  0-2 drinks a day for men. ? Be aware of how much alcohol is in your drink. In the [place] one drink equals one 12 oz bottle of  beer (355 mL), one 5 oz glass of wine (148 mL), or one 1 oz glass of hard liquor (44 mL). Lifestyle   Work with your health care provider to maintain a healthy body weight or to lose weight. Ask what an ideal weight is for you.  Get at least 30 minutes of exercise most days of the week. Activities may include walking, swimming, or biking.  Include exercise to strengthen your muscles (resistance exercise), such as Pilates or lifting weights, as part of your weekly exercise routine. Try to do these types of exercises for 30 minutes at least 3 days a week.  Do not use any products that contain nicotine or tobacco, such as cigarettes, e-cigarettes, and chewing tobacco. If you need help quitting, ask your health care provider.  Monitor your blood pressure at home as told by your health care provider.  Keep all follow-up visits as told by your health care provider. This is important. Medicines  Take over-the-counter and prescription medicines only as told by your health care  provider. Follow directions carefully. Blood pressure medicines must be taken as prescribed.  Do not skip doses of blood pressure medicine. Doing this puts you at risk for problems and can make the medicine less effective.  Ask your health care provider about side effects or reactions to medicines that you should watch for. Contact a health care provider if you:  Think you are having a reaction to a medicine you are taking.  Have headaches that keep coming back (recurring).  Feel dizzy.  Have swelling in your ankles.  Have trouble with your vision. Get help right away if you:  Develop a severe headache or confusion.  Have unusual weakness or numbness.  Feel faint.  Have severe pain in your chest or abdomen.  Vomit repeatedly.  Have trouble breathing. Summary  Hypertension is when the force of blood pumping through your arteries is too strong. If this condition is not controlled, it may put you at risk  for serious complications.  Your personal target blood pressure may vary depending on your medical conditions, your age, and other factors. For most people, a normal blood pressure is less than 120/80.  Hypertension is treated with lifestyle changes, medicines, or a combination of both. Lifestyle changes include losing weight, eating a healthy, low-sodium diet, exercising more, and limiting alcohol. This information is not intended to replace advice given to you by your health care provider. Make sure you discuss any questions you have with your health care provider. Document Revised: 06/23/2018 Document Reviewed: 06/23/2018 Elsevier Patient Education  2020 [place] Your Hypertension Hypertension is commonly called high blood pressure. This is when the force of your blood pressing against the walls of your arteries is too strong. Arteries are blood vessels that carry blood from your heart throughout your body. Hypertension forces the heart to work harder to pump blood, and may cause the arteries to become narrow or stiff. Having untreated or uncontrolled hypertension can cause heart attack, stroke, kidney disease, and other problems. What are blood pressure readings? A blood pressure reading consists of a higher number over a lower number. Ideally, your blood pressure should be below 120/80. The first ("top") number is called the systolic pressure. It is a measure of the pressure in your arteries as your heart beats. The second ("bottom") number is called the diastolic pressure. It is a measure of the pressure in your arteries as the heart relaxes. What does my blood pressure reading mean? Blood pressure is classified into four stages. Based on your blood pressure reading, your health care provider may use the following stages to determine what type of treatment you need, if any. Systolic pressure and diastolic pressure are measured in a unit called mm Hg. Normal  Systolic pressure:  below 120.  Diastolic pressure: below 80. Elevated  Systolic pressure: 008-676.  Diastolic pressure: below 80. Hypertension stage 1  Systolic pressure: 195-093.  Diastolic pressure: 26-71. Hypertension stage 2  Systolic pressure: 245 or above.  Diastolic pressure: 90 or above. What health risks are associated with hypertension? Managing your hypertension is an important responsibility. Uncontrolled hypertension can lead to:  A heart attack.  A stroke.  A weakened blood vessel (aneurysm).  Heart failure.  Kidney damage.  Eye damage.  Metabolic syndrome.  Memory and concentration problems. What changes can I make to manage my hypertension? Hypertension can be managed by making lifestyle changes and possibly by taking medicines. Your health care provider will help you make a plan to bring your blood  pressure within a normal range. Eating and drinking   Eat a diet that is high in fiber and potassium, and low in salt (sodium), added sugar, and fat. An example eating plan is called the DASH (Dietary Approaches to Stop Hypertension) diet. To eat this way: ? Eat plenty of fresh fruits and vegetables. Try to fill half of your plate at each meal with fruits and vegetables. ? Eat whole grains, such as whole wheat pasta, brown rice, or whole grain bread. Fill about one quarter of your plate with whole grains. ? Eat low-fat diary products. ? Avoid fatty cuts of meat, processed or cured meats, and poultry with skin. Fill about one quarter of your plate with lean proteins such as fish, chicken without skin, beans, eggs, and tofu. ? Avoid premade and processed foods. These tend to be higher in sodium, added sugar, and fat.  Reduce your daily sodium intake. Most people with hypertension should eat less than 1,500 mg of sodium a day.  Limit alcohol intake to no more than 1 drink a day for nonpregnant women and 2 drinks a day for men. One drink equals 12 oz of beer, 5 oz of wine, or 1  oz of hard liquor. Lifestyle  Work with your health care provider to maintain a healthy body weight, or to lose weight. Ask what an ideal weight is for you.  Get at least 30 minutes of exercise that causes your heart to beat faster (aerobic exercise) most days of the week. Activities may include walking, swimming, or biking.  Include exercise to strengthen your muscles (resistance exercise), such as weight lifting, as part of your weekly exercise routine. Try to do these types of exercises for 30 minutes at least 3 days a week.  Do not use any products that contain nicotine or tobacco, such as cigarettes and e-cigarettes. If you need help quitting, ask your health care provider.  Control any long-term (chronic) conditions you have, such as high cholesterol or diabetes. Monitoring  Monitor your blood pressure at home as told by your health care provider. Your personal target blood pressure may vary depending on your medical conditions, your age, and other factors.  Have your blood pressure checked regularly, as often as told by your health care provider. Working with your health care provider  Review all the medicines you take with your health care provider because there may be side effects or interactions.  Talk with your health care provider about your diet, exercise habits, and other lifestyle factors that may be contributing to hypertension.  Visit your health care provider regularly. Your health care provider can help you create and adjust your plan for managing hypertension. Will I need medicine to control my blood pressure? Your health care provider may prescribe medicine if lifestyle changes are not enough to get your blood pressure under control, and if:  Your systolic blood pressure is 130 or higher.  Your diastolic blood pressure is 80 or higher. Take medicines only as told by your health care provider. Follow the directions carefully. Blood pressure medicines must be taken as  prescribed. The medicine does not work as well when you skip doses. Skipping doses also puts you at risk for problems. Contact a health care provider if:  You think you are having a reaction to medicines you have taken.  You have repeated (recurrent) headaches.  You feel dizzy.  You have swelling in your ankles.  You have trouble with your vision. Get help right away if:  You develop a severe headache or confusion.  You have unusual weakness or numbness, or you feel faint.  You have severe pain in your chest or abdomen.  You vomit repeatedly.  You have trouble breathing. Summary  Hypertension is when the force of blood pumping through your arteries is too strong. If this condition is not controlled, it may put you at risk for serious complications.  Your personal target blood pressure may vary depending on your medical conditions, your age, and other factors. For most people, a normal blood pressure is less than 120/80.  Hypertension is managed by lifestyle changes, medicines, or both. Lifestyle changes include weight loss, eating a healthy, low-sodium diet, exercising more, and limiting alcohol. This information is not intended to replace advice given to you by your health care provider. Make sure you discuss any questions you have with your health care provider. Document Revised: 02/04/2019 Document Reviewed: 09/10/2016 Elsevier Patient Education  [place].  Insomnia Insomnia is a sleep disorder that makes it difficult to fall asleep or stay asleep. Insomnia can cause fatigue, low energy, difficulty concentrating, mood swings, and poor performance at work or school. There are three different ways to classify insomnia:  Difficulty falling asleep.  Difficulty staying asleep.  Waking up too early in the morning. Any type of insomnia can be long-term (chronic) or short-term (acute). Both are common. Short-term insomnia usually lasts for three months or less. Chronic  insomnia occurs at least three times a week for longer than three months. What are the causes? Insomnia may be caused by another condition, situation, or substance, such as:  Anxiety.  Certain medicines.  Gastroesophageal reflux disease (GERD) or other gastrointestinal conditions.  Asthma or other breathing conditions.  Restless legs syndrome, sleep apnea, or other sleep disorders.  Chronic pain.  Menopause.  Stroke.  Abuse of alcohol, tobacco, or illegal drugs.  Mental health conditions, such as depression.  Caffeine.  Neurological disorders, such as Alzheimer's disease.  An overactive thyroid (hyperthyroidism). Sometimes, the cause of insomnia may not be known. What increases the risk? Risk factors for insomnia include:  Gender. Women are affected more often than men.  Age. Insomnia is more common as you get older.  Stress.  Lack of exercise.  Irregular work schedule or working night shifts.  Traveling between different time zones.  Certain medical and mental health conditions. What are the signs or symptoms? If you have insomnia, the main symptom is having trouble falling asleep or having trouble staying asleep. This may lead to other symptoms, such as:  Feeling fatigued or having low energy.  Feeling nervous about going to sleep.  Not feeling rested in the morning.  Having trouble concentrating.  Feeling irritable, anxious, or depressed. How is this diagnosed? This condition may be diagnosed based on:  Your symptoms and medical history. Your health care provider may ask about: ? Your sleep habits. ? Any medical conditions you have. ? Your mental health.  A physical exam. How is this treated? Treatment for insomnia depends on the cause. Treatment may focus on treating an underlying condition that is causing insomnia. Treatment may also include:  Medicines to help you sleep.  Counseling or therapy.  Lifestyle adjustments to help you sleep  better. Follow these instructions at home: Eating and drinking   Limit or avoid alcohol, caffeinated beverages, and cigarettes, especially close to bedtime. These can disrupt your sleep.  Do not eat a large meal or eat spicy foods right before bedtime. This can lead  to digestive discomfort that can make it hard for you to sleep. Sleep habits   Keep a sleep diary to help you and your health care provider figure out what could be causing your insomnia. Write down: ? When you sleep. ? When you wake up during the night. ? How well you sleep. ? How rested you feel the next day. ? Any side effects of medicines you are taking. ? What you eat and drink.  Make your bedroom a dark, comfortable place where it is easy to fall asleep. ? Put up shades or blackout curtains to block light from outside. ? Use a white noise machine to block noise. ? Keep the temperature cool.  Limit screen use before bedtime. This includes: ? Watching TV. ? Using your smartphone, tablet, or computer.  Stick to a routine that includes going to bed and waking up at the same times every day and night. This can help you fall asleep faster. Consider making a quiet activity, such as reading, part of your nighttime routine.  Try to avoid taking naps during the day so that you sleep better at night.  Get out of bed if you are still awake after 15 minutes of trying to sleep. Keep the lights down, but try reading or doing a quiet activity. When you feel sleepy, go back to bed. General instructions  Take over-the-counter and prescription medicines only as told by your health care provider.  Exercise regularly, as told by your health care provider. Avoid exercise starting several hours before bedtime.  Use relaxation techniques to manage stress. Ask your health care provider to suggest some techniques that may work well for you. These may include: ? Breathing exercises. ? Routines to release muscle tension. ? Visualizing  peaceful scenes.  Make sure that you drive carefully. Avoid driving if you feel very sleepy.  Keep all follow-up visits as told by your health care provider. This is important. Contact a health care provider if:  You are tired throughout the day.  You have trouble in your daily routine due to sleepiness.  You continue to have sleep problems, or your sleep problems get worse. Get help right away if:  You have serious thoughts about hurting yourself or someone else. If you ever feel like you may hurt yourself or others, or have thoughts about taking your own life, get help right away. You can go to your nearest emergency department or call:  Your local emergency services (911 in the [place]  A suicide crisis helpline, such as the [facility] [place] [facility] at (864)617-3546. This is open 24 hours a day. Summary  Insomnia is a sleep disorder that makes it difficult to fall asleep or stay asleep.  Insomnia can be long-term (chronic) or short-term (acute).  Treatment for insomnia depends on the cause. Treatment may focus on treating an underlying condition that is causing insomnia.  Keep a sleep diary to help you and your health care provider figure out what could be causing your insomnia. This information is not intended to replace advice given to you by your health care provider. Make sure you discuss any questions you have with your health care provider. Document Revised: 09/25/2017 Document Reviewed: 07/23/2017 Elsevier Patient Education  [place].  Sleep Studies A sleep study (polysomnogram) is a series of tests done while you are sleeping. A sleep study records your brain waves, heart rate, breathing rate, oxygen level, and eye and leg movements. A sleep study helps your health care provider:  See how well you sleep.  Diagnose a sleep disorder.  Determine how severe your sleep disorder is.  Create a plan to treat your sleep disorder. Your health care  provider may recommend a sleep study if you:  Feel sleepy on most days.  Snore loudly while sleeping.  Have unusual behaviors while you sleep, such as walking.  Have brief periods in which you stop breathing during sleep (sleepapnea).  Fall asleep suddenly during the day (narcolepsy).  Have trouble falling asleep or staying asleep (insomnia).  Feel like you need to move your legs when trying to fall asleep (restless legs syndrome).  Move your legs by flexing and extending them regularly while asleep (periodic limb movement disorder).  Act out your dreams while you sleep (sleep behavior disorder).  Feel like you cannot move when you first wake up (sleep paralysis). What tests are part of a sleep study? Most sleep studies record the following during sleep:  Brain activity.  Eye movements.  Heart rate and rhythm.  Breathing rate and rhythm.  Blood-oxygen level.  Blood pressure.  Chest and belly movement as you breathe.  Arm and leg movements.  Snoring or other noises.  Body position. Where are sleep studies done? Sleep studies are done at sleep centers. A sleep center may be inside a hospital, office, or clinic. The room where you have the study may look like a hospital room or a hotel room. The health care providers doing the study may come in and out of the room during the study. Most of the time, they will be in another room monitoring your test as you sleep. How are sleep studies done? Most sleep studies are done during a normal period of time for a full night of sleep. You will arrive at the study center in the evening and go home in the morning. Before the test  Bring your pajamas and toothbrush with you to the sleep study.  Do not have caffeine on the day of your sleep study.  Do not drink alcohol on the day of your sleep study.  Your health care provider will let you know if you should stop taking any of your regular medicines before the test. During the  test      Round, sticky patches with sensors attached to recording wires (electrodes) are placed on your scalp, face, chest, and limbs.  Wires from all the electrodes and sensors run from your bed to a computer. The wires can be taken off and put back on if you need to get out of bed to go to the bathroom.  A sensor is placed over your nose to measure airflow.  A finger clip is put on your finger or ear to measure your blood oxygen level (pulse oximetry).  A belt is placed around your belly and a belt is placed around your chest to measure breathing movements.  If you have signs of the sleep disorder called sleep apnea during your test, you may get a treatment mask to wear for the second half of the night. ? The mask provides positive airway pressure (PAP) to help you breathe better during sleep. This may greatly improve your sleep apnea. ? You will then have all tests done again with the mask in place to see if your measurements and recordings change. After the test  A medical doctor who specializes in sleep will evaluate the results of your sleep study and share them with you and your primary health care provider.  Based  on your results, your medical history, and a physical exam, you may be diagnosed with a sleep disorder, such as: ? Sleep apnea. ? Restless legs syndrome. ? Sleep-related behavior disorder. ? Sleep-related movement disorders. ? Sleep-related seizure disorders.  Your health care team will help determine your treatment options based on your diagnosis. This may include: ? Improving your sleep habits (sleep hygiene). ? Wearing a continuous positive airway pressure (CPAP) or bi-level positive airway pressure (BPAP) mask. ? Wearing an oral device at night to improve breathing and reduce snoring. ? Taking medicines. Follow these instructions at home:  Take over-the-counter and prescription medicines only as told by your health care provider.  If you are instructed to  use a CPAP or BPAP mask, make sure you use it nightly as directed.  Make any lifestyle changes that your health care provider recommends.  If you were given a device to open your airway while you sleep, use it only as told by your health care provider.  Do not use any tobacco products, such as cigarettes, chewing tobacco, and e-cigarettes. If you need help quitting, ask your health care provider.  Keep all follow-up visits as told by your health care provider. This is important. Summary  A sleep study (polysomnogram) is a series of tests done while you are sleeping. It shows how well you sleep.  Most sleep studies are done over one full night of sleep. You will arrive at the study center in the evening and go home in the morning.  If you have signs of the sleep disorder called sleep apnea during your test, you may get a treatment mask to wear for the second half of the night.  A medical doctor who specializes in sleep will evaluate the results of your sleep study and share them with your primary health care provider. This information is not intended to replace advice given to you by your health care provider. Make sure you discuss any questions you have with your health care provider. Document Revised: 03/30/2019 Document Reviewed: 11/10/2017 Elsevier Patient Education  [place].

## 2020-07-27 ENCOUNTER — Encounter: Payer: Medicare HMO | Admitting: Gastroenterology

## 2020-07-31 ENCOUNTER — Other Ambulatory Visit: Payer: Self-pay | Admitting: Ophthalmology

## 2020-07-31 DIAGNOSIS — I6523 Occlusion and stenosis of bilateral carotid arteries: Secondary | ICD-10-CM

## 2020-08-01 DIAGNOSIS — H40053 Ocular hypertension, bilateral: Secondary | ICD-10-CM | POA: Diagnosis not present

## 2020-08-04 LAB — COLOGUARD

## 2020-08-04 LAB — EXTERNAL GENERIC LAB PROCEDURE

## 2020-08-06 ENCOUNTER — Telehealth: Payer: Self-pay | Admitting: Family Medicine

## 2020-08-06 ENCOUNTER — Ambulatory Visit
Admission: RE | Admit: 2020-08-06 | Discharge: 2020-08-06 | Disposition: A | Discharge status: Home / Self Care | Payer: Medicare HMO | Source: Ambulatory Visit | Attending: Ophthalmology | Admitting: Ophthalmology

## 2020-08-06 DIAGNOSIS — I6523 Occlusion and stenosis of bilateral carotid arteries: Secondary | ICD-10-CM

## 2020-08-06 NOTE — Telephone Encounter (Signed)
Patient declined AWV for this year 2021

## 2020-08-07 ENCOUNTER — Ambulatory Visit: Payer: Medicare HMO | Admitting: Internal Medicine

## 2020-08-10 ENCOUNTER — Telehealth: Payer: Self-pay | Admitting: Family Medicine

## 2020-08-10 NOTE — Telephone Encounter (Signed)
Spoke with patient spouse she stated he has a lot of procedures to do and would like a CB 09/10/20

## 2020-08-24 ENCOUNTER — Other Ambulatory Visit: Payer: Self-pay

## 2020-08-24 ENCOUNTER — Ambulatory Visit (INDEPENDENT_AMBULATORY_CARE_PROVIDER_SITE_OTHER): Payer: Medicare HMO | Admitting: Family Medicine

## 2020-08-24 ENCOUNTER — Encounter: Payer: Self-pay | Admitting: Family Medicine

## 2020-08-24 VITALS — BP 180/100 | HR 81 | Temp 98.0°F | Wt 158.6 lb

## 2020-08-24 DIAGNOSIS — E1142 Type 2 diabetes mellitus with diabetic polyneuropathy: Secondary | ICD-10-CM

## 2020-08-24 DIAGNOSIS — G4700 Insomnia, unspecified: Secondary | ICD-10-CM | POA: Diagnosis not present

## 2020-08-24 DIAGNOSIS — H35033 Hypertensive retinopathy, bilateral: Secondary | ICD-10-CM | POA: Diagnosis not present

## 2020-08-24 DIAGNOSIS — Z794 Long term (current) use of insulin: Secondary | ICD-10-CM | POA: Diagnosis not present

## 2020-08-24 DIAGNOSIS — E113411 Type 2 diabetes mellitus with severe nonproliferative diabetic retinopathy with macular edema, right eye: Secondary | ICD-10-CM | POA: Diagnosis not present

## 2020-08-24 DIAGNOSIS — Z1211 Encounter for screening for malignant neoplasm of colon: Secondary | ICD-10-CM

## 2020-08-24 DIAGNOSIS — R011 Cardiac murmur, unspecified: Secondary | ICD-10-CM

## 2020-08-24 DIAGNOSIS — I10 Essential (primary) hypertension: Secondary | ICD-10-CM

## 2020-08-24 DIAGNOSIS — I6523 Occlusion and stenosis of bilateral carotid arteries: Secondary | ICD-10-CM | POA: Diagnosis not present

## 2020-08-24 MED ORDER — DILTIAZEM HCL ER COATED BEADS 180 MG PO TB24: 180.0000 mg | ORAL_TABLET | Freq: Every day | ORAL | 1 refills | Status: DC

## 2020-08-24 NOTE — Progress Notes (Signed)
Subjective:    Patient ID: [name], male    DOB: [birth year], 69 y.o.   MRN: [id number]  No chief complaint on file. Patient is accompanied by his significant other, [name]  HPI Pt is a 69 yo male with pmh sig for HTN with hypertensive retinopathy of both eyes, DM II with severe diabetic nonproliferative retinopathy right eye, diabetic macular edema, and polyneuropathy, b/l cataract, b/l carotid artery stenosis, HLD, OA, h/o depression, BPH, ED, insomnia who was seen for follow-up.  Patient endorses taking all medications.  Taking Norvasc 10 mg, losartan 100 mg for blood pressure.  BP at home 166/61, 142/66, 145/77, 155/70, 154/71, 181/62, 196/67, 151/64.  Patient has several readings with systolic numbers only.  Per patient's girlfriend he is not taking his meds every day as he often has half a bottle or more of medication when a refill is due.  Pt did not take meds this am.  Patient endorses continued insomnia.  Has not heard about sleep study referral.  Feels like she will be unable to sleep for a sleep study.  Patient endorses taking 20 units of Novolin 70/30 twice daily.  Does not always eat lunch.  Blood sugars 165, 75, 91, 62, 197, 181, 130, 142, 166, 65, 79, 81.  Pt states he is not adding extra salt to his food.  Patient has upcoming follow-up with ophthalmology.  States had testing done, awaiting results.  Patient completed Cologuard, however received a notice from the lab stating they were unable to find DNA in the sample.  Patient does not understand how this is possible.  Hesitant about colonoscopy, but willing to consider.  Past Medical History:  Diagnosis Date  . Diabetes mellitus without complication ([place])   . Insomnia     No Known Allergies  ROS General: Denies fever, chills, night sweats, changes in weight, changes in appetite  + insomnia HEENT: Denies headaches, ear pain, changes in vision, rhinorrhea, sore throat CV: Denies CP, palpitations, SOB,  orthopnea Pulm: Denies SOB, cough, wheezing GI: Denies abdominal pain, nausea, vomiting, diarrhea, constipation GU: Denies dysuria, hematuria, frequency, vaginal discharge Msk: Denies muscle cramps, joint pains Neuro: Denies weakness, numbness, tingling Skin: Denies rashes, bruising Psych: Denies depression, anxiety, hallucinations   Objective:    Blood pressure (!) 180/100, pulse 81, temperature 98 F (36.7 C), temperature source Oral, weight 158 lb 9.6 oz (71.9 kg), SpO2 97 %. Repeat BP 172/90  Gen. Pleasant, well-nourished, in no distress, normal affect  HEENT: [place]/AT, face symmetric, conjunctiva clear, no scleral icterus, PERRLA, EOMI, nares patent without drainage Lungs: no accessory muscle use, CTAB, no wheezes or rales Cardiovascular: RRR, murmur, no r/g, no peripheral edema Abdomen: BS present, soft, NT/ND. Musculoskeletal: No deformities, no cyanosis or clubbing, normal tone Neuro:  A&Ox3, CN II-XII intact, normal gait Skin:  Warm, no lesions/ rash, calluses on bilateral fingers   Wt Readings from Last 3 Encounters:  08/24/20 158 lb 9.6 oz (71.9 kg)  07/25/20 156 lb 12.8 oz (71.1 kg)  06/18/20 156 lb (70.8 kg)    Lab Results  Component Value Date   WBC 3.3 (L) 11/10/2018   HGB 12.9 (L) 11/10/2018   HCT 36.3 (L) 11/10/2018   PLT 332.0 11/10/2018   GLUCOSE 209 (H) 02/21/2020   CHOL 101 02/21/2020   TRIG 58.0 02/21/2020   HDL 34.10 (L) 02/21/2020   LDLCALC 55 02/21/2020   ALT 29 02/21/2020   AST 30 02/21/2020   NA 143 02/21/2020   K 4.8 02/21/2020  CL 103 02/21/2020   CREATININE 1.27 02/21/2020   BUN 28 (H) 02/21/2020   CO2 34 (H) 02/21/2020   TSH 0.78 11/10/2018   PSA 6.49 (H) 03/21/2020   HGBA1C 7.0 (A) 03/22/2020   MICROALBUR 13.6 (H) 11/10/2018    Assessment/Plan:  Essential hypertension  -Uncontrolled -BP recheck 172/90 by this provider. -Attempted to stress the importance of better blood pressure control to help reduce the risks of future CVD  events, damage to kidneys, further damage to eyes, etc. -Discussed the importance of lifestyle modifications and compliance with medication -Discussed starting Cardizem LA 180 mg daily. -Patient to stop Norvasc 10 mg daily -Continue losartan 100 mg. -Given precautions - Plan: [facility] Sleep Study, diltiazem (CARDIZEM LA) 180 MG 24 hr tablet  Type 2 diabetes mellitus with diabetic polyneuropathy, with long-term current use of insulin ([facility]) -Last hemoglobin A1c 7.0% on 03/22/2020 -Continue Novolin 70/30.  Patient to take 20 units in a.m. and 22 in p.m. -Patient strongly encouraged not to skip meals during the day or wait to eat after taking insulin. -Continue checking FSBS regularly and keeping a log to bring to the clinic -Discussed lifestyle modifications -Patient on statin and ARB -Seen by ophthalmology.  Encouraged to keep upcoming appointment  Insomnia, unspecified type  -Concern for OSA may be contributing to HTN - Plan: PSG Sleep Study  Colon cancer screening  -Cologuard completed, however lab unable to provide results as no DNA detected in sample. -We will proceed with colonoscopy.  Discussed r/b/a. - Plan: Ambulatory referral to Gastroenterology  Severe nonproliferative diabetic retinopathy of right eye with macular edema associated with type 2 diabetes mellitus ([place]) -Discussed the importance of glycemic control -Continue lifestyle modifications -Continue Novolin 70/30 20 units in a.m. and increasing to 22 units in p.m. -Encouraged to keep follow-up appointment with ophthalmology  Hypertensive retinopathy of both eyes -Discussed the importance of BP control through compliance with meds and diet changes -Continue losartan 100 mg daily. -We will discontinue Norvasc 10 mg daily. -We will start Cardizem LA 180 mg daily  Murmur -Likely 2/2 CAD -Continue Lipitor.  Will likely need to increase dose from 40 mg to 80 mg -We will obtain lipid panel next OFV -Discussed lifestyle  modifications -Also discussed the importance of BP control. -Continue to monitor -Consider referral to cardiology and echo  Bilateral carotid artery stenosis -Bilateral carotid ultrasound 08/07/2020 with moderate left-sided atherosclerotic plaque with left internal carotid artery approaches 50% luminal narrowing range.  Minimal amount of right-sided atherosclerotic plaque. -Continue Lipitor.  Will recheck lipid panel in the near future.  Will likely need to increase dose of Lipitor from 40 to 80 mg.  Will await on lipid panel. -Consider starting ASA 81 mg daily.  F/u in 2 wks  Grier Mitts, MD

## 2020-08-24 NOTE — Patient Instructions (Addendum)
Adding another blood pressure medication we will have you stop taking the Norvasc 10 mg and change medications to a different type in that same class.  The new medication called diltiazem (Cardizem LA) 180 mg daily.  Continue checking your blood pressure daily.  Today we also discussed increasing your evening dose of Novolin 70/30 from 20 units to 22 units.  Your morning dose will remain at 20 units for now.  We will have you follow-up in 2 weeks, sooner if needed. Managing Your Hypertension Hypertension is commonly called high blood pressure. This is when the force of your blood pressing against the walls of your arteries is too strong. Arteries are blood vessels that carry blood from your heart throughout your body. Hypertension forces the heart to work harder to pump blood, and may cause the arteries to become narrow or stiff. Having untreated or uncontrolled hypertension can cause heart attack, stroke, kidney disease, and other problems. What are blood pressure readings? A blood pressure reading consists of a higher number over a lower number. Ideally, your blood pressure should be below 120/80. The first ("top") number is called the systolic pressure. It is a measure of the pressure in your arteries as your heart beats. The second ("bottom") number is called the diastolic pressure. It is a measure of the pressure in your arteries as the heart relaxes. What does my blood pressure reading mean? Blood pressure is classified into four stages. Based on your blood pressure reading, your health care provider may use the following stages to determine what type of treatment you need, if any. Systolic pressure and diastolic pressure are measured in a unit called mm Hg. Normal  Systolic pressure: below 967.  Diastolic pressure: below 80. Elevated  Systolic pressure: 893-810.  Diastolic pressure: below 80. Hypertension stage 1  Systolic pressure: 175-102.  Diastolic pressure: 58-52. Hypertension  stage 2  Systolic pressure: 778 or above.  Diastolic pressure: 90 or above. What health risks are associated with hypertension? Managing your hypertension is an important responsibility. Uncontrolled hypertension can lead to:  A heart attack.  A stroke.  A weakened blood vessel (aneurysm).  Heart failure.  Kidney damage.  Eye damage.  Metabolic syndrome.  Memory and concentration problems. What changes can I make to manage my hypertension? Hypertension can be managed by making lifestyle changes and possibly by taking medicines. Your health care provider will help you make a plan to bring your blood pressure within a normal range. Eating and drinking   Eat a diet that is high in fiber and potassium, and low in salt (sodium), added sugar, and fat. An example eating plan is called the DASH (Dietary Approaches to Stop Hypertension) diet. To eat this way: ? Eat plenty of fresh fruits and vegetables. Try to fill half of your plate at each meal with fruits and vegetables. ? Eat whole grains, such as whole wheat pasta, brown rice, or whole grain bread. Fill about one quarter of your plate with whole grains. ? Eat low-fat diary products. ? Avoid fatty cuts of meat, processed or cured meats, and poultry with skin. Fill about one quarter of your plate with lean proteins such as fish, chicken without skin, beans, eggs, and tofu. ? Avoid premade and processed foods. These tend to be higher in sodium, added sugar, and fat.  Reduce your daily sodium intake. Most people with hypertension should eat less than 1,500 mg of sodium a day.  Limit alcohol intake to no more than 1 drink a day  for nonpregnant women and 2 drinks a day for men. One drink equals 12 oz of beer, 5 oz of wine, or 1 oz of hard liquor. Lifestyle  Work with your health care provider to maintain a healthy body weight, or to lose weight. Ask what an ideal weight is for you.  Get at least 30 minutes of exercise that causes  your heart to beat faster (aerobic exercise) most days of the week. Activities may include walking, swimming, or biking.  Include exercise to strengthen your muscles (resistance exercise), such as weight lifting, as part of your weekly exercise routine. Try to do these types of exercises for 30 minutes at least 3 days a week.  Do not use any products that contain nicotine or tobacco, such as cigarettes and e-cigarettes. If you need help quitting, ask your health care provider.  Control any long-term (chronic) conditions you have, such as high cholesterol or diabetes. Monitoring  Monitor your blood pressure at home as told by your health care provider. Your personal target blood pressure may vary depending on your medical conditions, your age, and other factors.  Have your blood pressure checked regularly, as often as told by your health care provider. Working with your health care provider  Review all the medicines you take with your health care provider because there may be side effects or interactions.  Talk with your health care provider about your diet, exercise habits, and other lifestyle factors that may be contributing to hypertension.  Visit your health care provider regularly. Your health care provider can help you create and adjust your plan for managing hypertension. Will I need medicine to control my blood pressure? Your health care provider may prescribe medicine if lifestyle changes are not enough to get your blood pressure under control, and if:  Your systolic blood pressure is 130 or higher.  Your diastolic blood pressure is 80 or higher. Take medicines only as told by your health care provider. Follow the directions carefully. Blood pressure medicines must be taken as prescribed. The medicine does not work as well when you skip doses. Skipping doses also puts you at risk for problems. Contact a health care provider if:  You think you are having a reaction to medicines you  have taken.  You have repeated (recurrent) headaches.  You feel dizzy.  You have swelling in your ankles.  You have trouble with your vision. Get help right away if:  You develop a severe headache or confusion.  You have unusual weakness or numbness, or you feel faint.  You have severe pain in your chest or abdomen.  You vomit repeatedly.  You have trouble breathing. Summary  Hypertension is when the force of blood pumping through your arteries is too strong. If this condition is not controlled, it may put you at risk for serious complications.  Your personal target blood pressure may vary depending on your medical conditions, your age, and other factors. For most people, a normal blood pressure is less than 120/80.  Hypertension is managed by lifestyle changes, medicines, or both. Lifestyle changes include weight loss, eating a healthy, low-sodium diet, exercising more, and limiting alcohol. This information is not intended to replace advice given to you by your health care provider. Make sure you discuss any questions you have with your health care provider. Document Revised: 02/04/2019 Document Reviewed: 09/10/2016 Elsevier Patient Education  [place].  Insomnia Insomnia is a sleep disorder that makes it difficult to fall asleep or stay asleep. Insomnia  can cause fatigue, low energy, difficulty concentrating, mood swings, and poor performance at work or school. There are three different ways to classify insomnia:  Difficulty falling asleep.  Difficulty staying asleep.  Waking up too early in the morning. Any type of insomnia can be long-term (chronic) or short-term (acute). Both are common. Short-term insomnia usually lasts for three months or less. Chronic insomnia occurs at least three times a week for longer than three months. What are the causes? Insomnia may be caused by another condition, situation, or substance, such as:  Anxiety.  Certain  medicines.  Gastroesophageal reflux disease (GERD) or other gastrointestinal conditions.  Asthma or other breathing conditions.  Restless legs syndrome, sleep apnea, or other sleep disorders.  Chronic pain.  Menopause.  Stroke.  Abuse of alcohol, tobacco, or illegal drugs.  Mental health conditions, such as depression.  Caffeine.  Neurological disorders, such as Alzheimer's disease.  An overactive thyroid (hyperthyroidism). Sometimes, the cause of insomnia may not be known. What increases the risk? Risk factors for insomnia include:  Gender. Women are affected more often than men.  Age. Insomnia is more common as you get older.  Stress.  Lack of exercise.  Irregular work schedule or working night shifts.  Traveling between different time zones.  Certain medical and mental health conditions. What are the signs or symptoms? If you have insomnia, the main symptom is having trouble falling asleep or having trouble staying asleep. This may lead to other symptoms, such as:  Feeling fatigued or having low energy.  Feeling nervous about going to sleep.  Not feeling rested in the morning.  Having trouble concentrating.  Feeling irritable, anxious, or depressed. How is this diagnosed? This condition may be diagnosed based on:  Your symptoms and medical history. Your health care provider may ask about: ? Your sleep habits. ? Any medical conditions you have. ? Your mental health.  A physical exam. How is this treated? Treatment for insomnia depends on the cause. Treatment may focus on treating an underlying condition that is causing insomnia. Treatment may also include:  Medicines to help you sleep.  Counseling or therapy.  Lifestyle adjustments to help you sleep better. Follow these instructions at home: Eating and drinking   Limit or avoid alcohol, caffeinated beverages, and cigarettes, especially close to bedtime. These can disrupt your sleep.  Do not  eat a large meal or eat spicy foods right before bedtime. This can lead to digestive discomfort that can make it hard for you to sleep. Sleep habits   Keep a sleep diary to help you and your health care provider figure out what could be causing your insomnia. Write down: ? When you sleep. ? When you wake up during the night. ? How well you sleep. ? How rested you feel the next day. ? Any side effects of medicines you are taking. ? What you eat and drink.  Make your bedroom a dark, comfortable place where it is easy to fall asleep. ? Put up shades or blackout curtains to block light from outside. ? Use a white noise machine to block noise. ? Keep the temperature cool.  Limit screen use before bedtime. This includes: ? Watching TV. ? Using your smartphone, tablet, or computer.  Stick to a routine that includes going to bed and waking up at the same times every day and night. This can help you fall asleep faster. Consider making a quiet activity, such as reading, part of your nighttime routine.  Try to avoid  taking naps during the day so that you sleep better at night.  Get out of bed if you are still awake after 15 minutes of trying to sleep. Keep the lights down, but try reading or doing a quiet activity. When you feel sleepy, go back to bed. General instructions  Take over-the-counter and prescription medicines only as told by your health care provider.  Exercise regularly, as told by your health care provider. Avoid exercise starting several hours before bedtime.  Use relaxation techniques to manage stress. Ask your health care provider to suggest some techniques that may work well for you. These may include: ? Breathing exercises. ? Routines to release muscle tension. ? Visualizing peaceful scenes.  Make sure that you drive carefully. Avoid driving if you feel very sleepy.  Keep all follow-up visits as told by your health care provider. This is important. Contact a health  care provider if:  You are tired throughout the day.  You have trouble in your daily routine due to sleepiness.  You continue to have sleep problems, or your sleep problems get worse. Get help right away if:  You have serious thoughts about hurting yourself or someone else. If you ever feel like you may hurt yourself or others, or have thoughts about taking your own life, get help right away. You can go to your nearest emergency department or call:  Your local emergency services (911 in the [place]  A suicide crisis helpline, such as the [facility] at (463)802-4428. This is open 24 hours a day. Summary  Insomnia is a sleep disorder that makes it difficult to fall asleep or stay asleep.  Insomnia can be long-term (chronic) or short-term (acute).  Treatment for insomnia depends on the cause. Treatment may focus on treating an underlying condition that is causing insomnia.  Keep a sleep diary to help you and your health care provider figure out what could be causing your insomnia. This information is not intended to replace advice given to you by your health care provider. Make sure you discuss any questions you have with your health care provider. Document Revised: 09/25/2017 Document Reviewed: 07/23/2017 Elsevier Patient Education  2020 [place] Screening  Colorectal cancer screening is a group of tests that are used to check for colorectal cancer before symptoms develop. Colorectal refers to the colon and rectum. The colon and rectum are located at the end of the digestive tract and carry bowel movements out of the body. Who should have screening? All adults starting at age 6 until age 73 should have screening. Your health care provider may recommend screening at age 21. You will have tests every 1-10 years, depending on your results and the type of screening test. You may have screening tests starting at an earlier age, or more  frequently than other people, if you have any of the following risk factors:  A personal or family history of colorectal cancer or abnormal growths (polyps).  Inflammatory bowel disease, such as ulcerative colitis or Crohn's disease.  A history of having radiation treatment to the abdomen or pelvic area for cancer.  Colorectal cancer symptoms, such as changes in bowel habits or blood in your stool.  A type of colon cancer syndrome that is passed from parent to child (hereditary), such as: ? Lynch syndrome. ? Familial adenomatous polyposis. ? Turcot syndrome. ? Peutz-Jeghers syndrome. Screening recommendations for adults who are 37-39 years old vary depending on health. How is screening done? There are several  types of colorectal screening tests. You may have one or more of the following:  Guaiac-based fecal occult blood testing. For this test, a stool (feces) sample is checked for hidden (occult) blood, which could be a sign of colorectal cancer.  Fecal immunochemical test (FIT). For this test, a stool sample is checked for blood, which could be a sign of colorectal cancer.  Stool DNA test. For this test, a stool sample is checked for blood and changes in DNA that could lead to colorectal cancer.  Sigmoidoscopy. During this test, a thin, flexible tube with a camera on the end (sigmoidoscope) is used to examine the rectum and the lower colon.  Colonoscopy. During this test, a long, flexible tube with a camera on the end (colonoscope) is used to examine the entire colon and rectum. With a colonoscopy, it is possible to take a sample of tissue (biopsy) and remove small polyps during the test.  Virtual colonoscopy. Instead of a colonoscope, this type of colonoscopy uses X-rays (CT scan) and computers to produce images of the colon and rectum. What are the benefits of screening? Screening reduces your risk for colorectal cancer and can help identify cancer at an early stage, when the cancer  can be removed or treated more easily. It is common for polyps to form in the lining of the colon, especially as you age. These polyps may be cancerous or become cancerous over time. Screening can identify these polyps. What are the risks of screening? Each screening test may have different risks.  Stool sample tests have fewer risks than other types of screening tests. However, you may need more tests to confirm results from a stool sample test.  Screening tests that involve X-rays expose you to low levels of radiation, which may slightly increase your cancer risk. The benefit of detecting cancer outweighs the slight increase in risk.  Screening tests such as sigmoidoscopy and colonoscopy may place you at risk for bleeding, intestinal damage, infection, or a reaction to medicines given during the exam. Talk with your health care provider to understand your risk for colorectal cancer and to make a screening plan that is right for you. Questions to ask your health care provider  When should I start colorectal cancer screening?  What is my risk for colorectal cancer?  How often do I need screening?  Which screening tests do I need?  How do I get my test results?  What do my results mean? Where to find more information Learn more about colorectal cancer screening from:  The [place]: www.cancer.org  The [employer]: www.cancer.gov Summary  Colorectal cancer screening is a group of tests used to check for colorectal cancer before symptoms develop.  Screening reduces your risk for colorectal cancer and can help identify cancer at an early stage, when the cancer can be removed or treated more easily.  All adults starting at age 59 until age 48 should have screening. Your health care provider may recommend screening at age 55.  You may have screening tests starting at an earlier age, or more frequently than other people, if you have certain risk  factors.  Talk with your health care provider to understand your risk for colorectal cancer and to make a screening plan that is right for you. This information is not intended to replace advice given to you by your health care provider. Make sure you discuss any questions you have with your health care provider. Document Revised: 02/02/2019 Document Reviewed: 07/15/2017 Elsevier  Patient Education  [employer].  Diabetes Mellitus and Nutrition, Adult When you have diabetes (diabetes mellitus), it is very important to have healthy eating habits because your blood sugar (glucose) levels are greatly affected by what you eat and drink. Eating healthy foods in the appropriate amounts, at about the same times every day, can help you:  Control your blood glucose.  Lower your risk of heart disease.  Improve your blood pressure.  Reach or maintain a healthy weight. Every person with diabetes is different, and each person has different needs for a meal plan. Your health care provider may recommend that you work with a diet and nutrition specialist (dietitian) to make a meal plan that is best for you. Your meal plan may vary depending on factors such as:  The calories you need.  The medicines you take.  Your weight.  Your blood glucose, blood pressure, and cholesterol levels.  Your activity level.  Other health conditions you have, such as heart or kidney disease. How do carbohydrates affect me? Carbohydrates, also called carbs, affect your blood glucose level more than any other type of food. Eating carbs naturally raises the amount of glucose in your blood. Carb counting is a method for keeping track of how many carbs you eat. Counting carbs is important to keep your blood glucose at a healthy level, especially if you use insulin or take certain oral diabetes medicines. It is important to know how many carbs you can safely have in each meal. This is different for every person. Your  dietitian can help you calculate how many carbs you should have at each meal and for each snack. Foods that contain carbs include:  Bread, cereal, rice, pasta, and crackers.  Potatoes and corn.  Peas, beans, and lentils.  Milk and yogurt.  Fruit and juice.  Desserts, such as cakes, cookies, ice cream, and candy. How does alcohol affect me? Alcohol can cause a sudden decrease in blood glucose (hypoglycemia), especially if you use insulin or take certain oral diabetes medicines. Hypoglycemia can be a life-threatening condition. Symptoms of hypoglycemia (sleepiness, dizziness, and confusion) are similar to symptoms of having too much alcohol. If your health care provider says that alcohol is safe for you, follow these guidelines:  Limit alcohol intake to no more than 1 drink per day for nonpregnant women and 2 drinks per day for men. One drink equals 12 oz of beer, 5 oz of wine, or 1 oz of hard liquor.  Do not drink on an empty stomach.  Keep yourself hydrated with water, diet soda, or unsweetened iced tea.  Keep in mind that regular soda, juice, and other mixers may contain a lot of sugar and must be counted as carbs. What are tips for following this plan?  Reading food labels  Start by checking the serving size on the "Nutrition Facts" label of packaged foods and drinks. The amount of calories, carbs, fats, and other nutrients listed on the label is based on one serving of the item. Many items contain more than one serving per package.  Check the total grams (g) of carbs in one serving. You can calculate the number of servings of carbs in one serving by dividing the total carbs by 15. For example, if a food has 30 g of total carbs, it would be equal to 2 servings of carbs.  Check the number of grams (g) of saturated and trans fats in one serving. Choose foods that have low or no amount of  these fats.  Check the number of milligrams (mg) of salt (sodium) in one serving. Most people  should limit total sodium intake to less than 2,300 mg per day.  Always check the nutrition information of foods labeled as "low-fat" or "nonfat". These foods may be higher in added sugar or refined carbs and should be avoided.  Talk to your dietitian to identify your daily goals for nutrients listed on the label. Shopping  Avoid buying canned, premade, or processed foods. These foods tend to be high in fat, sodium, and added sugar.  Shop around the outside edge of the grocery store. This includes fresh fruits and vegetables, bulk grains, fresh meats, and fresh dairy. Cooking  Use low-heat cooking methods, such as baking, instead of high-heat cooking methods like deep frying.  Cook using healthy oils, such as olive, canola, or sunflower oil.  Avoid cooking with butter, cream, or high-fat meats. Meal planning  Eat meals and snacks regularly, preferably at the same times every day. Avoid going long periods of time without eating.  Eat foods high in fiber, such as fresh fruits, vegetables, beans, and whole grains. Talk to your dietitian about how many servings of carbs you can eat at each meal.  Eat 4-6 ounces (oz) of lean protein each day, such as lean meat, chicken, fish, eggs, or tofu. One oz of lean protein is equal to: ? 1 oz of meat, chicken, or fish. ? 1 egg. ?  cup of tofu.  Eat some foods each day that contain healthy fats, such as avocado, nuts, seeds, and fish. Lifestyle  Check your blood glucose regularly.  Exercise regularly as told by your health care provider. This may include: ? 150 minutes of moderate-intensity or vigorous-intensity exercise each week. This could be brisk walking, biking, or water aerobics. ? Stretching and doing strength exercises, such as yoga or weightlifting, at least 2 times a week.  Take medicines as told by your health care provider.  Do not use any products that contain nicotine or tobacco, such as cigarettes and e-cigarettes. If you need  help quitting, ask your health care provider.  Work with a Social worker or diabetes educator to identify strategies to manage stress and any emotional and social challenges. Questions to ask a health care provider  Do I need to meet with a diabetes educator?  Do I need to meet with a dietitian?  What number can I call if I have questions?  When are the best times to check my blood glucose? Where to find more information:  American Diabetes Association: diabetes.org  Academy of Nutrition and Dietetics: www.eatright.[employer] of Diabetes and Digestive and Kidney Diseases (NIH): [employer] Summary  A healthy meal plan will help you control your blood glucose and maintain a healthy lifestyle.  Working with a diet and nutrition specialist (dietitian) can help you make a meal plan that is best for you.  Keep in mind that carbohydrates (carbs) and alcohol have immediate effects on your blood glucose levels. It is important to count carbs and to use alcohol carefully. This information is not intended to replace advice given to you by your health care provider. Make sure you discuss any questions you have with your health care provider. Document Revised: 09/25/2017 Document Reviewed: 11/17/2016 Elsevier Patient Education  2020 [employer].

## 2020-08-28 ENCOUNTER — Telehealth: Payer: Self-pay

## 2020-08-28 DIAGNOSIS — H35033 Hypertensive retinopathy, bilateral: Secondary | ICD-10-CM | POA: Insufficient documentation

## 2020-08-28 DIAGNOSIS — I6523 Occlusion and stenosis of bilateral carotid arteries: Secondary | ICD-10-CM | POA: Insufficient documentation

## 2020-08-28 DIAGNOSIS — E113411 Type 2 diabetes mellitus with severe nonproliferative diabetic retinopathy with macular edema, right eye: Secondary | ICD-10-CM | POA: Insufficient documentation

## 2020-08-28 DIAGNOSIS — R011 Cardiac murmur, unspecified: Secondary | ICD-10-CM | POA: Insufficient documentation

## 2020-08-28 DIAGNOSIS — H2513 Age-related nuclear cataract, bilateral: Secondary | ICD-10-CM | POA: Insufficient documentation

## 2020-08-28 NOTE — Telephone Encounter (Signed)
Pt spouse called the office this morning to report that after pt last visit she noticed that pt was only taking 5 mg of the Amlodipine instead of 2 of the 5 mg(10 mg) tablets increased  on 06/28/2020.Pt started taking the new Rx diltiazem HCL 180 mg but BP is still elevated reported 168/79 at AM today.Pt denies any High blood pressure associated symptoms. Advised  to make sure pt is taking all the BP medications as prescribed and to keep records of the reading and if have concerns to call the office back. Pt aware that Dr Volanda Napoleon will be back at the office tomorrow. Verbalized understanding.

## 2020-08-29 DIAGNOSIS — D075 Carcinoma in situ of prostate: Secondary | ICD-10-CM | POA: Diagnosis not present

## 2020-08-29 DIAGNOSIS — C61 Malignant neoplasm of prostate: Secondary | ICD-10-CM | POA: Diagnosis not present

## 2020-08-29 DIAGNOSIS — R972 Elevated prostate specific antigen [PSA]: Secondary | ICD-10-CM | POA: Diagnosis not present

## 2020-08-30 ENCOUNTER — Other Ambulatory Visit: Payer: Self-pay | Admitting: Family Medicine

## 2020-08-30 ENCOUNTER — Other Ambulatory Visit: Payer: Self-pay

## 2020-08-30 MED ORDER — NOVOLIN 70/30 FLEXPEN (70-30) 100 UNIT/ML ~~LOC~~ SUPN
PEN_INJECTOR | SUBCUTANEOUS | 11 refills | Status: DC
Start: 2020-08-30 — End: 2021-04-24

## 2020-09-07 ENCOUNTER — Other Ambulatory Visit: Payer: Self-pay

## 2020-09-07 ENCOUNTER — Encounter: Payer: Self-pay | Admitting: Family Medicine

## 2020-09-07 ENCOUNTER — Ambulatory Visit (INDEPENDENT_AMBULATORY_CARE_PROVIDER_SITE_OTHER): Payer: Medicare HMO | Admitting: Family Medicine

## 2020-09-07 ENCOUNTER — Ambulatory Visit: Payer: Medicare HMO | Admitting: Podiatry

## 2020-09-07 VITALS — BP 200/96 | HR 87 | Temp 98.0°F | Wt 162.2 lb

## 2020-09-07 DIAGNOSIS — C61 Malignant neoplasm of prostate: Secondary | ICD-10-CM

## 2020-09-07 DIAGNOSIS — E1165 Type 2 diabetes mellitus with hyperglycemia: Secondary | ICD-10-CM | POA: Diagnosis not present

## 2020-09-07 DIAGNOSIS — R238 Other skin changes: Secondary | ICD-10-CM | POA: Diagnosis not present

## 2020-09-07 DIAGNOSIS — T148XXA Other injury of unspecified body region, initial encounter: Secondary | ICD-10-CM

## 2020-09-07 DIAGNOSIS — E1142 Type 2 diabetes mellitus with diabetic polyneuropathy: Secondary | ICD-10-CM

## 2020-09-07 DIAGNOSIS — D519 Vitamin B12 deficiency anemia, unspecified: Secondary | ICD-10-CM | POA: Diagnosis not present

## 2020-09-07 DIAGNOSIS — L988 Other specified disorders of the skin and subcutaneous tissue: Secondary | ICD-10-CM

## 2020-09-07 DIAGNOSIS — I10 Essential (primary) hypertension: Secondary | ICD-10-CM | POA: Diagnosis not present

## 2020-09-07 DIAGNOSIS — L84 Corns and callosities: Secondary | ICD-10-CM | POA: Diagnosis not present

## 2020-09-07 DIAGNOSIS — G4700 Insomnia, unspecified: Secondary | ICD-10-CM

## 2020-09-07 MED ORDER — DILTIAZEM HCL ER COATED BEADS 300 MG PO TB24: 300.0000 mg | ORAL_TABLET | Freq: Every day | ORAL | 3 refills | Status: DC

## 2020-09-07 NOTE — Progress Notes (Signed)
Subjective:    Patient ID: [name], male    DOB: 24-Sep-1951, 69 y.o.   MRN: [id number]  No chief complaint on file. Patient accompanied by his SO, [name].  HPI Pt is a 69 yo male with pmf sig for HTN, bilateral carotid artery stenosis, DM type II, neuropathy, severe nonproliferative diabetic retinopathy, macular edema, cataracts, OA, HLD, ED, insomnia who was seen for follow-up.  Pt seen on 08/24/2020 for uncontrolled BP.  Norvasc 10 mg discontinued and Cardizem LA 180 mg started.  Also taking losartan 100 mg.  Since last office visit patient BP remains elevated.  Pt endorses continued insomnia.  Pt keeps the tv on at night.  Sleep study pending.    Notes blister on feet.  Pt states blisters appeared after walking 1.5 miles when his work truck broke down.  Pt does not have diabetic shoes.  Pt has f/u with Dr. Brigitte Pulse, Ophthalmology on 11/19 for retinopathy.  Pt notes the tips of his fingers have been numb x 1 yr.  Patient seen by urology for prostate biopsy as PSA elevation noted. Biopsy results positive for prostatic adenocarcinoma. Follow-up with Dr. Roni Bread, urology on 12/9. Patient has a bone scan scheduled.    Past Medical History:  Diagnosis Date  . Diabetes mellitus without complication ([place])   . Insomnia     No Known Allergies  ROS General: Denies fever, chills, night sweats, changes in weight, changes in appetite  + insomnia HEENT: Denies headaches, ear pain, changes in vision, rhinorrhea, sore throat CV: Denies CP, palpitations, SOB, orthopnea Pulm: Denies SOB, cough, wheezing GI: Denies abdominal pain, nausea, vomiting, diarrhea, constipation GU: Denies dysuria, hematuria, frequency, vaginal discharge Msk: Denies muscle cramps, joint pains Neuro: Denies weakness, numbness, tingling  + numbness in fingertips Skin: Denies rashes, bruising  + blisters on feet Psych: Denies depression, anxiety, hallucinations     Objective:    Blood pressure (!) 200/96, pulse 87,  temperature 98 F (36.7 C), temperature source Oral, weight 162 lb 3.2 oz (73.6 kg), SpO2 95 %.  Gen. Pleasant, well-nourished, in no distress, normal affect  HEENT: [place]/AT, face symmetric, conjunctiva clear, no scleral icterus, PERRLA, EOMI, nares patent without drainage Lungs: no accessory muscle use, CTAB, no wheezes or rales Cardiovascular: RRR, no m/r/g, no peripheral edema Musculoskeletal: No deformities, no cyanosis or clubbing, normal tone Neuro:  A&Ox3, CN II-XII intact, normal gait Skin:  Warm, no rash. Vesicles on toes of bilateral feet. Several ruptured vesicles oozing serosanguineous fluid.   Wt Readings from Last 3 Encounters:  08/24/20 158 lb 9.6 oz (71.9 kg)  07/25/20 156 lb 12.8 oz (71.1 kg)  06/18/20 156 lb (70.8 kg)    Lab Results  Component Value Date   WBC 3.3 (L) 11/10/2018   HGB 12.9 (L) 11/10/2018   HCT 36.3 (L) 11/10/2018   PLT 332.0 11/10/2018   GLUCOSE 209 (H) 02/21/2020   CHOL 101 02/21/2020   TRIG 58.0 02/21/2020   HDL 34.10 (L) 02/21/2020   LDLCALC 55 02/21/2020   ALT 29 02/21/2020   AST 30 02/21/2020   NA 143 02/21/2020   K 4.8 02/21/2020   CL 103 02/21/2020   CREATININE 1.27 02/21/2020   BUN 28 (H) 02/21/2020   CO2 34 (H) 02/21/2020   TSH 0.78 11/10/2018   PSA 6.49 (H) 03/21/2020   HGBA1C 7.0 (A) 03/22/2020   MICROALBUR 13.6 (H) 11/10/2018    Assessment/Plan:  Type 2 diabetes mellitus with diabetic polyneuropathy, without long-term current use of insulin ([place])  -  Hemoglobin A1c 7% on 03/22/2020 -Discussed the importance of lifestyle modifications -Continue Novolin 70/30 20 units in the a.m. and 22 units in p.m. -Continue checking FSBS regularly and keeping a log to bring with you to clinic -On ARB and statin -Continue follow-up with ophthalmology, Dr. Manuella Ghazi for diabetic retinopathy - Plan: Ambulatory referral to Podiatry, For Home Use Only DME Diabetic Shoe, Hemoglobin A1c, Fructosamine, Lipid panel, Vitamin B12, Folate, Folate, Vitamin  B12, Lipid panel, Fructosamine, Hemoglobin A1c  Formation of vesicles  -Blisters on toes of bilateral feet likely 2/2 wearing narrow shoes that caused friction against feet while walking when extended. -Discussed diabetic shoes. Rx given to patient this visit. - Plan: Ambulatory referral to Podiatry  Foot callus  - Plan: Ambulatory referral to Podiatry  Prostate cancer [facility]) -PSA 6.49 on 03/21/20 -Urology records reviewed.  Adenocarcinoma noted from recent prostate biopsy -Patient encouraged to keep appointment for bone scan -Continue follow-up with urology for further recommendations  Essential hypertension -Uncontrolled -Discussed the importance of lifestyle modifications -We will increase Cardizem LA 180 mg to 300 mg daily -Continue losartan 100 mg daily. -Discussed renal ultrasound -We will obtain labs -Patient advised to check BP at home and keep a log to bring with him to clinic as well as his blood pressure cuff -Continue follow-up with Dr. Brigitte Pulse, Ophthalmology - Plan: diltiazem (CARDIZEM LA) 300 MG 24 hr tablet, CMP with eGFR([facility]), CBC with Differential/Platelet, TSH, T4, free, renal ultrasound  Insomnia, unspecified type -Chronic -Possibly worsened by anxiety 2/2 recent health concerns -Discussed sleep hygiene -Sleep study pending - Plan: TSH, T4, free  F/u in 2 weeks, sooner if needed  Grier Mitts, MD

## 2020-09-07 NOTE — Patient Instructions (Addendum)
Managing Your Hypertension Hypertension is commonly called high blood pressure. This is when the force of your blood pressing against the walls of your arteries is too strong. Arteries are blood vessels that carry blood from your heart throughout your body. Hypertension forces the heart to work harder to pump blood, and may cause the arteries to become narrow or stiff. Having untreated or uncontrolled hypertension can cause heart attack, stroke, kidney disease, and other problems. What are blood pressure readings? A blood pressure reading consists of a higher number over a lower number. Ideally, your blood pressure should be below 120/80. The first ("top") number is called the systolic pressure. It is a measure of the pressure in your arteries as your heart beats. The second ("bottom") number is called the diastolic pressure. It is a measure of the pressure in your arteries as the heart relaxes. What does my blood pressure reading mean? Blood pressure is classified into four stages. Based on your blood pressure reading, your health care provider may use the following stages to determine what type of treatment you need, if any. Systolic pressure and diastolic pressure are measured in a unit called mm Hg. Normal  Systolic pressure: below 120.  Diastolic pressure: below 80. Elevated  Systolic pressure: 120-129.  Diastolic pressure: below 80. Hypertension stage 1  Systolic pressure: 130-139.  Diastolic pressure: 80-89. Hypertension stage 2  Systolic pressure: 140 or above.  Diastolic pressure: 90 or above. What health risks are associated with hypertension? Managing your hypertension is an important responsibility. Uncontrolled hypertension can lead to:  A heart attack.  A stroke.  A weakened blood vessel (aneurysm).  Heart failure.  Kidney damage.  Eye damage.  Metabolic syndrome.  Memory and concentration problems. What changes can I make to manage my  hypertension? Hypertension can be managed by making lifestyle changes and possibly by taking medicines. Your health care provider will help you make a plan to bring your blood pressure within a normal range. Eating and drinking   Eat a diet that is high in fiber and potassium, and low in salt (sodium), added sugar, and fat. An example eating plan is called the DASH (Dietary Approaches to Stop Hypertension) diet. To eat this way: ? Eat plenty of fresh fruits and vegetables. Try to fill half of your plate at each meal with fruits and vegetables. ? Eat whole grains, such as whole wheat pasta, brown rice, or whole grain bread. Fill about one quarter of your plate with whole grains. ? Eat low-fat diary products. ? Avoid fatty cuts of meat, processed or cured meats, and poultry with skin. Fill about one quarter of your plate with lean proteins such as fish, chicken without skin, beans, eggs, and tofu. ? Avoid premade and processed foods. These tend to be higher in sodium, added sugar, and fat.  Reduce your daily sodium intake. Most people with hypertension should eat less than 1,500 mg of sodium a day.  Limit alcohol intake to no more than 1 drink a day for nonpregnant women and 2 drinks a day for men. One drink equals 12 oz of beer, 5 oz of wine, or 1 oz of hard liquor. Lifestyle  Work with your health care provider to maintain a healthy body weight, or to lose weight. Ask what an ideal weight is for you.  Get at least 30 minutes of exercise that causes your heart to beat faster (aerobic exercise) most days of the week. Activities may include walking, swimming, or biking.  Include exercise   to strengthen your muscles (resistance exercise), such as weight lifting, as part of your weekly exercise routine. Try to do these types of exercises for 30 minutes at least 3 days a week.  Do not use any products that contain nicotine or tobacco, such as cigarettes and e-cigarettes. If you need help quitting,  ask your health care provider.  Control any long-term (chronic) conditions you have, such as high cholesterol or diabetes. Monitoring  Monitor your blood pressure at home as told by your health care provider. Your personal target blood pressure may vary depending on your medical conditions, your age, and other factors.  Have your blood pressure checked regularly, as often as told by your health care provider. Working with your health care provider  Review all the medicines you take with your health care provider because there may be side effects or interactions.  Talk with your health care provider about your diet, exercise habits, and other lifestyle factors that may be contributing to hypertension.  Visit your health care provider regularly. Your health care provider can help you create and adjust your plan for managing hypertension. Will I need medicine to control my blood pressure? Your health care provider may prescribe medicine if lifestyle changes are not enough to get your blood pressure under control, and if:  Your systolic blood pressure is 130 or higher.  Your diastolic blood pressure is 80 or higher. Take medicines only as told by your health care provider. Follow the directions carefully. Blood pressure medicines must be taken as prescribed. The medicine does not work as well when you skip doses. Skipping doses also puts you at risk for problems. Contact a health care provider if:  You think you are having a reaction to medicines you have taken.  You have repeated (recurrent) headaches.  You feel dizzy.  You have swelling in your ankles.  You have trouble with your vision. Get help right away if:  You develop a severe headache or confusion.  You have unusual weakness or numbness, or you feel faint.  You have severe pain in your chest or abdomen.  You vomit repeatedly.  You have trouble breathing. Summary  Hypertension is when the force of blood pumping  through your arteries is too strong. If this condition is not controlled, it may put you at risk for serious complications.  Your personal target blood pressure may vary depending on your medical conditions, your age, and other factors. For most people, a normal blood pressure is less than 120/80.  Hypertension is managed by lifestyle changes, medicines, or both. Lifestyle changes include weight loss, eating a healthy, low-sodium diet, exercising more, and limiting alcohol. This information is not intended to replace advice given to you by your health care provider. Make sure you discuss any questions you have with your health care provider. Document Revised: 02/04/2019 Document Reviewed: 09/10/2016 Elsevier Patient Education  2020 [employer] prostate is a walnut-sized gland that is involved in the production of semen. It is located below a man's bladder, in front of the rectum. Prostate cancer is the abnormal growth of cells in the prostate gland. What are the causes? The exact cause of this condition is not known. What increases the risk? This condition is more likely to develop in men who:  Are older than age 89.  Are African-American.  Are obese.  Have a family history of prostate cancer.  Have a family history of breast cancer. What are the signs or symptoms?  Symptoms of this condition include:  A need to urinate often.  Weak or interrupted flow of urine.  Trouble starting or stopping urination.  Inability to urinate.  Pain or burning during urination.  Painful ejaculation.  Blood in urine or semen.  Persistent pain or discomfort in the lower back, lower abdomen, hips, or upper thighs.  Trouble getting an erection.  Trouble emptying the bladder all the way. How is this diagnosed? This condition can be diagnosed with:  A digital rectal exam. For this exam, a health care provider inserts a gloved finger into the rectum to feel the  prostate gland.  A blood test called a prostate-specific antigen (PSA) test.  An imaging test called transrectal ultrasonography.  A procedure in which a sample of tissue is taken from the prostate and examined under a microscope (prostate biopsy). Once the condition is diagnosed, tests will be done to determine how far the cancer has spread. This is called staging the cancer. Staging may involve imaging tests, such as:  A bone scan.  A CT scan.  A PET scan.  An MRI. The stages of prostate cancer are as follows:  Stage I. At this stage, the cancer is found in the prostate only. The cancer is not visible on imaging tests and it is usually found by accident, such as during a prostate surgery.  Stage II. At this stage, the cancer is more advanced than it is in stage I, but the cancer has not spread outside the prostate.  Stage III. At this stage, the cancer has spread beyond the outer layer of the prostate to nearby tissues. The cancer may be found in the seminal vesicles, which are near the bladder and the prostate.  Stage IV. At this stage, the cancer has spread other parts of the body, such as the lymph nodes, bones, bladder, rectum, liver, or lungs. How is this treated? Treatment for this condition depends on several factors, including the stage of the cancer, your age, personal preferences, and your overall health. Talk with your health care provider about treatment options that are recommended for you. Common treatments include:  Observation for early stage prostate cancer (active surveillance). This involves having exams, blood tests, and in some cases, more biopsies. For some men, this is the only treatment needed.  Surgery. Types of surgeries include: ? Open surgery. In this surgery, a larger incision is made to remove the prostate. ? A laparoscopic prostatectomy. This is a surgery to remove the prostate and lymph nodes through several, small incisions. It is often referred to  as a minimally invasive surgery. ? A robotic prostatectomy. This is a surgery to remove the prostate and lymph nodes with the help of a robotic arm that is controlled by a computer. ? Orchiectomy. This is a surgery to remove the testicles. ? Cryosurgery. This is a surgery to freeze and destroy cancer cells.  Radiation treatment. Types of radiation treatment include: ? External beam radiation. This type aims beams of radiation from outside the body at the prostate to destroy cancerous cells. ? Brachytherapy. This type uses radioactive needles, seeds, wires, or tubes that are implanted into the prostate gland. Like external beam radiation, brachytherapy destroys cancerous cells. An advantage is that this type of radiation limits the damage to surrounding tissue and has fewer side effects.  High-intensity, focused ultrasonography. This treatment destroys cancer cells by delivering high-energy ultrasound waves to the cancerous cells.  Chemotherapy medicines. This treatment kills cancer cells or stops them from multiplying.  Hormone treatment. This treatment involves taking medicines that act on one of the male hormones (testosterone): ? By stopping your body from producing testosterone. ? By blocking testosterone from reaching cancer cells. Follow these instructions at home:  Take over-the-counter and prescription medicines only as told by your health care provider.  Maintain a healthy diet.  Get plenty of sleep.  Consider joining a support group for men who have prostate cancer. Meeting with a support group may help you learn to cope with the stress of having cancer.  Keep all follow-up visits as told by your health care provider. This is important.  If you have to go to the hospital, notify your cancer specialist (oncologist).  Treatment for prostate cancer may affect sexual function. Continue to have intimate moments with your partner. This may include touching, holding, hugging, and  caressing. Contact a health care provider if:  You have trouble urinating.  You have blood in your urine.  You have pain in your hips, back, or chest. Get help right away if:  You have weakness or numbness in your legs.  You cannot control urination or your bowel movements (incontinence).  You have trouble breathing.  You have sudden chest pain.  You have chills or a fever. Summary  The prostate is a walnut-sized gland that is involved in the production of semen. It is located below a man's bladder, in front of the rectum. Prostate cancer is the abnormal growth of cells in the prostate gland.  Treatment for this condition depends on several factors, including the stage of the cancer, your age, personal preferences, and your overall health. Talk with your health care provider about treatment options that are recommended for you.  Consider joining a support group for men who have prostate cancer. Meeting with a support group may help you learn to cope with the stress of having cancer. This information is not intended to replace advice given to you by your health care provider. Make sure you discuss any questions you have with your health care provider. Document Revised: 09/25/2017 Document Reviewed: 06/23/2016 Elsevier Patient Education  2020 [employer].

## 2020-09-10 ENCOUNTER — Telehealth: Payer: Self-pay | Admitting: Family Medicine

## 2020-09-10 LAB — COMPLETE METABOLIC PANEL WITH GFR
AG Ratio: 1.7 (calc) (ref 1.0–2.5)
ALT: 29 U/L (ref 9–46)
AST: 33 U/L (ref 10–35)
Albumin: 4.1 g/dL (ref 3.6–5.1)
Alkaline phosphatase (APISO): 93 U/L (ref 35–144)
BUN/Creatinine Ratio: 16 (calc) (ref 6–22)
BUN: 22 mg/dL (ref 7–25)
CO2: 27 mmol/L (ref 20–32)
Calcium: 9.5 mg/dL (ref 8.6–10.3)
Chloride: 106 mmol/L (ref 98–110)
Creat: 1.35 mg/dL — ABNORMAL HIGH (ref 0.70–1.25)
GFR, Est African American: 62 mL/min/{1.73_m2} (ref >=60)
GFR, Est Non African American: 54 mL/min/{1.73_m2} — ABNORMAL LOW (ref >=60)
Globulin: 2.4 g/dL (calc) (ref 1.9–3.7)
Glucose, Bld: 201 mg/dL — ABNORMAL HIGH (ref 65–99)
Potassium: 4.2 mmol/L (ref 3.5–5.3)
Sodium: 141 mmol/L (ref 135–146)
Total Bilirubin: 0.5 mg/dL (ref 0.2–1.2)
Total Protein: 6.5 g/dL (ref 6.1–8.1)

## 2020-09-10 LAB — FRUCTOSAMINE: Fructosamine: 308 umol/L — ABNORMAL HIGH (ref 205–285)

## 2020-09-10 LAB — CBC WITH DIFFERENTIAL/PLATELET
Absolute Monocytes: 467 cells/uL (ref 200–950)
Basophils Absolute: 21 cells/uL (ref 0–200)
Basophils Relative: 0.5 %
Eosinophils Absolute: 131 cells/uL (ref 15–500)
Eosinophils Relative: 3.2 %
HCT: 35.1 % — ABNORMAL LOW (ref 38.5–50.0)
Hemoglobin: 12.2 g/dL — ABNORMAL LOW (ref 13.2–17.1)
Lymphs Abs: 754 cells/uL — ABNORMAL LOW (ref 850–3900)
MCH: 31.9 pg (ref 27.0–33.0)
MCHC: 34.8 g/dL (ref 32.0–36.0)
MCV: 91.6 fL (ref 80.0–100.0)
MPV: 10.4 fL (ref 7.5–12.5)
Monocytes Relative: 11.4 %
Neutro Abs: 2727 cells/uL (ref 1500–7800)
Neutrophils Relative %: 66.5 %
Platelets: 196 10*3/uL (ref 140–400)
RBC: 3.83 10*6/uL — ABNORMAL LOW (ref 4.20–5.80)
RDW: 12 % (ref 11.0–15.0)
Total Lymphocyte: 18.4 %
WBC: 4.1 10*3/uL (ref 3.8–10.8)

## 2020-09-10 LAB — TSH: TSH: 1.01 mIU/L (ref 0.40–4.50)

## 2020-09-10 LAB — LIPID PANEL
Cholesterol: 137 mg/dL (ref <200)
HDL: 53 mg/dL (ref >=40)
LDL Cholesterol (Calc): 70 mg/dL (calc)
Non-HDL Cholesterol (Calc): 84 mg/dL (calc) (ref <130)
Total CHOL/HDL Ratio: 2.6 (calc) (ref <5.0)
Triglycerides: 56 mg/dL (ref <150)

## 2020-09-10 LAB — HEMOGLOBIN A1C
Hgb A1c MFr Bld: 6.6 % of total Hgb — ABNORMAL HIGH (ref <5.7)
Mean Plasma Glucose: 143 (calc)
eAG (mmol/L): 7.9 (calc)

## 2020-09-10 LAB — T4, FREE: Free T4: 0.9 ng/dL (ref 0.8–1.8)

## 2020-09-10 LAB — VITAMIN B12: Vitamin B-12: 329 pg/mL (ref 200–1100)

## 2020-09-10 LAB — FOLATE: Folate: 13.9 ng/mL

## 2020-09-10 NOTE — Telephone Encounter (Signed)
The patient seen Dr. Volanda Napoleon on Friday after he was diagnosed with Prostate Cancer. He needs something called in to help him sleep.     [facility] [place], [place] Phone:  [phone]  Fax:  [phone]

## 2020-09-10 NOTE — Telephone Encounter (Signed)
Please advise 

## 2020-09-11 ENCOUNTER — Other Ambulatory Visit: Payer: Self-pay

## 2020-09-11 ENCOUNTER — Encounter (HOSPITAL_COMMUNITY): Payer: Self-pay | Admitting: Emergency Medicine

## 2020-09-11 ENCOUNTER — Encounter: Payer: Self-pay | Admitting: Podiatry

## 2020-09-11 ENCOUNTER — Ambulatory Visit (HOSPITAL_COMMUNITY)
Admission: EM | Admit: 2020-09-11 | Discharge: 2020-09-11 | Disposition: A | Discharge status: Home / Self Care | Payer: Medicare HMO | Attending: Urgent Care | Admitting: Urgent Care

## 2020-09-11 DIAGNOSIS — L089 Local infection of the skin and subcutaneous tissue, unspecified: Secondary | ICD-10-CM

## 2020-09-11 DIAGNOSIS — E119 Type 2 diabetes mellitus without complications: Secondary | ICD-10-CM

## 2020-09-11 MED ORDER — FLUCONAZOLE 150 MG PO TABS: 150.0000 mg | ORAL_TABLET | ORAL | 0 refills | Status: DC

## 2020-09-11 MED ORDER — CEPHALEXIN 500 MG PO CAPS: 500.0000 mg | ORAL_CAPSULE | Freq: Three times a day (TID) | ORAL | 0 refills | Status: DC

## 2020-09-11 NOTE — ED Provider Notes (Signed)
[place]   MRN: [id number] DOB: 1951-04-27  Subjective:   [name] is a 69 y.o. male presenting for 5-day history of blisters on his right foot, toes.  Patient saw his diabetes doctor on Friday and she referred him to a podiatrist.  He had this appointment yesterday.  Patient was provided with a postop shoe, ointment to help with his infection.  He was counseled on wound care and has been following instructions.  However, today he notes that the skin is tearing and he is concerned about infection.  Denies fever, red streaks, loss of sensation.  Recent diabetes check showed A1c less than 7%.  No current facility-administered medications for this encounter.  Current Outpatient Medications:  .  atorvastatin (LIPITOR) 40 MG tablet, TAKE 1 TABLET BY MOUTH EVERY DAY, Disp: 90 tablet, Rfl: 1 .  blood glucose meter kit and supplies, Dispense based on patient and insurance preference. Use to check glucose daily (FOR ICD-10 E10.9, E11.9)., Disp: 1 each, Rfl: 0 .  Continuous Blood Gluc Receiver (DEXCOM G6 RECEIVER) DEVI, 1 Device by Does not apply route as directed., Disp: 1 each, Rfl: 0 .  Continuous Blood Gluc Sensor (DEXCOM G6 SENSOR) MISC, 1 Device by Does not apply route as directed., Disp: 9 each, Rfl: 3 .  Continuous Blood Gluc Transmit (DEXCOM G6 TRANSMITTER) MISC, 1 Device by Does not apply route as directed., Disp: 1 each, Rfl: 3 .  diltiazem (CARDIZEM LA) 300 MG 24 hr tablet, Take 1 tablet (300 mg total) by mouth daily., Disp: 30 tablet, Rfl: 3 .  insulin isophane & regular human (NOVOLIN 70/30 FLEXPEN) (70-30) 100 UNIT/ML KwikPen, Inject 20 Units into the skin daily before breakfast AND 22 Units at bedtime., Disp: 15 mL, Rfl: 11 .  Insulin Pen Needle (BD PEN NEEDLE NANO 2ND GEN) 32G X 4 MM MISC, Use to inject insulin daily, Disp: 90 each, Rfl: 1 .  Insulin Pen Needle 31G X 8 MM MISC, Use as directed for checking blood sugar 3 times daily, more frequently for hyper  or hypoglycemia., Disp: 100 each, Rfl: 3 .  Lancet Devices (ONE TOUCH DELICA LANCING DEV) MISC, 1 Device by Does not apply route as directed., Disp: 1 each, Rfl: 0 .  losartan (COZAAR) 100 MG tablet, Take 1 tablet (100 mg total) by mouth daily., Disp: 90 tablet, Rfl: 1 .  omeprazole (PRILOSEC) 40 MG capsule, TAKE 1 CAPSULE BY MOUTH EVERY DAY, Disp: 30 capsule, Rfl: 1 .  OneTouch Delica Lancets 17B MISC, 1 Device by Does not apply route as directed. Use to test up to 4x daily., Disp: 400 each, Rfl: 1 .  ONETOUCH ULTRA test strip, USE UP TO 4X DAILY, Disp: 300 strip, Rfl: 2 .  sildenafil (VIAGRA) 100 MG tablet, Take 0.5-1 tablets (50-100 mg total) by mouth daily as needed for erectile dysfunction., Disp: 10 tablet, Rfl: 3   No Known Allergies  Past Medical History:  Diagnosis Date  . Diabetes mellitus without complication ([place])   . Insomnia      No past surgical history on file.  Family History  Problem Relation Age of Onset  . Colon cancer Mother   . Diabetes Brother     Social History   Tobacco Use  . Smoking status: Never Smoker  . Smokeless tobacco: Never Used  Substance Use Topics  . Alcohol use: No  . Drug use: No    ROS   Objective:   Vitals: BP (!) 151/72 (BP Location:  Left Arm)   Pulse 72   Temp 98.1 F (36.7 C) (Oral)   Resp 18   SpO2 96%   Physical Exam Constitutional:      General: He is not in acute distress.    Appearance: Normal appearance. He is well-developed and normal weight. He is not ill-appearing, toxic-appearing or diaphoretic.  HENT:     Head: Normocephalic and atraumatic.     Right Ear: External ear normal.     Left Ear: External ear normal.     Nose: Nose normal.     Mouth/Throat:     Pharynx: Oropharynx is clear.  Eyes:     General: No scleral icterus.       Right eye: No discharge.        Left eye: No discharge.     Extraocular Movements: Extraocular movements intact.     Pupils: Pupils are equal, round, and reactive to light.   Cardiovascular:     Rate and Rhythm: Normal rate.  Pulmonary:     Effort: Pulmonary effort is normal.  Musculoskeletal:     Cervical back: Normal range of motion.     Comments: Right dorsalis pedis 2+.  No erythema or warmth of proximal right foot.  All toes starting from the lateral aspect of the right great toe to the fifth toe have macerated skin with multiple unroofed vesicular lesions.  Sensation is intact to both sharp and soft sensations.  He has slight weeping as well.  Skin:    General: Skin is warm and dry.  Neurological:     Mental Status: He is alert and oriented to person, place, and time.  Psychiatric:        Mood and Affect: Mood normal.        Behavior: Behavior normal.        Thought Content: Thought content normal.        Judgment: Judgment normal.      Assessment and Plan :   PDMP not reviewed this encounter.  1. Right foot infection   2. Well controlled diabetes mellitus ([place])     Recommended starting Keflex 3 times daily, Diflucan.  This will address of bacterial and fungal infection.  Recommended follow-up closely with his podiatrist, PCP. Deferred imaging given duration of his symptoms and minimal tenderness with sensation intact. Counseled patient on potential for adverse effects with medications prescribed/recommended today, ER and return-to-clinic precautions discussed, patient verbalized understanding.    Jaynee Eagles, PA-C 09/11/20 2007

## 2020-09-11 NOTE — Discharge Instructions (Addendum)
Keep your foot dry. Follow up with your foot doctor. Take the antibiotic and the antifungal medication to address your foot infection. Follow their wound care instructions until you see them again.

## 2020-09-11 NOTE — Progress Notes (Signed)
Subjective:  Patient ID: [name], male    DOB: [birth year],  MRN: [id number]  Chief Complaint  Patient presents with  . blisters    Right foot blisters on toes has busted     69 y.o. male presents with the above complaint.  Patient presents with multiple serous blister formation to the right foot.  Patient states that they started yesterday and has multiple times burst and leading to fluid that appears to be very serous in nature.  Patient states that this could have likely been attributed to shoes rubbing against it.  Patient is a diabetic with last A1c of 6.6.  He has not done anything for this.  He denies any pain associated with this.   Review of Systems: Negative except as noted in the HPI. Denies N/V/F/Ch.  Past Medical History:  Diagnosis Date  . Diabetes mellitus without complication ([place])   . Insomnia     Current Outpatient Medications:  .  atorvastatin (LIPITOR) 40 MG tablet, TAKE 1 TABLET BY MOUTH EVERY DAY, Disp: 90 tablet, Rfl: 1 .  blood glucose meter kit and supplies, Dispense based on patient and insurance preference. Use to check glucose daily (FOR ICD-10 E10.9, E11.9)., Disp: 1 each, Rfl: 0 .  Continuous Blood Gluc Receiver (DEXCOM G6 RECEIVER) DEVI, 1 Device by Does not apply route as directed., Disp: 1 each, Rfl: 0 .  Continuous Blood Gluc Sensor (DEXCOM G6 SENSOR) MISC, 1 Device by Does not apply route as directed., Disp: 9 each, Rfl: 3 .  Continuous Blood Gluc Transmit (DEXCOM G6 TRANSMITTER) MISC, 1 Device by Does not apply route as directed., Disp: 1 each, Rfl: 3 .  diltiazem (CARDIZEM LA) 300 MG 24 hr tablet, Take 1 tablet (300 mg total) by mouth daily., Disp: 30 tablet, Rfl: 3 .  insulin isophane & regular human (NOVOLIN 70/30 FLEXPEN) (70-30) 100 UNIT/ML KwikPen, Inject 20 Units into the skin daily before breakfast AND 22 Units at bedtime., Disp: 15 mL, Rfl: 11 .  Insulin Pen Needle (BD PEN NEEDLE NANO 2ND GEN) 32G X 4 MM MISC, Use to inject insulin  daily, Disp: 90 each, Rfl: 1 .  Insulin Pen Needle 31G X 8 MM MISC, Use as directed for checking blood sugar 3 times daily, more frequently for hyper or hypoglycemia., Disp: 100 each, Rfl: 3 .  Lancet Devices (ONE TOUCH DELICA LANCING DEV) MISC, 1 Device by Does not apply route as directed., Disp: 1 each, Rfl: 0 .  losartan (COZAAR) 100 MG tablet, Take 1 tablet (100 mg total) by mouth daily., Disp: 90 tablet, Rfl: 1 .  omeprazole (PRILOSEC) 40 MG capsule, TAKE 1 CAPSULE BY MOUTH EVERY DAY, Disp: 30 capsule, Rfl: 1 .  OneTouch Delica Lancets 63J MISC, 1 Device by Does not apply route as directed. Use to test up to 4x daily., Disp: 400 each, Rfl: 1 .  ONETOUCH ULTRA test strip, USE UP TO 4X DAILY, Disp: 300 strip, Rfl: 2 .  sildenafil (VIAGRA) 100 MG tablet, Take 0.5-1 tablets (50-100 mg total) by mouth daily as needed for erectile dysfunction., Disp: 10 tablet, Rfl: 3  Social History   Tobacco Use  Smoking Status Never Smoker  Smokeless Tobacco Never Used    No Known Allergies Objective:  There were no vitals filed for this visit. There is no height or weight on file to calculate BMI. Constitutional Well developed. Well nourished.  Vascular Dorsalis pedis pulses palpable bilaterally. Posterior tibial pulses palpable bilaterally. Capillary refill normal to all digits.  No cyanosis or clubbing noted. Pedal hair growth normal.  Neurologic Normal speech. Oriented to person, place, and time. Epicritic sensation to light touch grossly present bilaterally.  Dermatologic Nails multiple blister formation noted to the second third fourth digit with macerated skin tissue.  No concern for infection noted.  No redness noted.  No ulceration noted. Skin macerated skin tissue  Orthopedic: Normal joint ROM without pain or crepitus bilaterally. No visible deformities. No bony tenderness.   Radiographs: None Assessment:   1. Uncontrolled type 2 diabetes mellitus with hyperglycemia ([place])   2.  Friction blister   3. Maceration of skin    Plan:  Patient was evaluated and treated and all questions answered.  Right second third and fourth digit frictional blister -I explained to the patient the etiology of friction blister and various treatment options were discussed with the patient.  Given that patient is a diabetic there is a high risk of ulceration formation.  I discussed with him that that he will benefit from shoe gear modification as this may have led to a lot of friction of blister formation.  At this time there is no concern for infection.  The blisters were drained in clinic which was mostly serous fluid.  No concern for ulceration noted at this time. -Aggressive Betadine wet-to-dry dressing changes once a day. -Surgical shoe was dispensed for offloading the forefoot.  No follow-ups on file.

## 2020-09-11 NOTE — ED Triage Notes (Signed)
Pt presents with right foot pain xs 3 days. States has blisters on right foot from work boots that are now open.

## 2020-09-12 ENCOUNTER — Other Ambulatory Visit (HOSPITAL_COMMUNITY): Payer: Self-pay | Admitting: Urology

## 2020-09-12 ENCOUNTER — Other Ambulatory Visit: Payer: Self-pay | Admitting: Urology

## 2020-09-12 ENCOUNTER — Other Ambulatory Visit: Payer: Self-pay | Admitting: Family Medicine

## 2020-09-12 ENCOUNTER — Telehealth: Payer: Self-pay | Admitting: Family Medicine

## 2020-09-12 DIAGNOSIS — C61 Malignant neoplasm of prostate: Secondary | ICD-10-CM

## 2020-09-12 DIAGNOSIS — G4700 Insomnia, unspecified: Secondary | ICD-10-CM

## 2020-09-12 DIAGNOSIS — F419 Anxiety disorder, unspecified: Secondary | ICD-10-CM

## 2020-09-12 MED ORDER — HYDROXYZINE HCL 25 MG PO TABS: 25.0000 mg | ORAL_TABLET | Freq: Every evening | ORAL | 0 refills | Status: DC | PRN

## 2020-09-12 NOTE — Telephone Encounter (Signed)
Patient girlfriend is calling and stated that patient was referred to [facility] and pt was very unhappy with service and wanted to see if pt can be referred to a wound clinic, please advise, CB [phone]

## 2020-09-12 NOTE — Telephone Encounter (Signed)
Spoke with pt spouse state that pt is having difficulty sleeping and is dealing with a lot anxiety, Pt is requesting for some medication for anxiety and sleep. Pt was also seen at the [facility] for his foot infection and he is currently taking 2 different antibiotics. Pt reports BP reading this morning is 101/60 Please advise

## 2020-09-12 NOTE — Telephone Encounter (Signed)
Encourage pt to increase po intake of water and fluids.  Recheck bp.  Schedule pt a f/u visit.  Will send in a low dose of hydroxyzine to help with sleep.

## 2020-09-13 NOTE — Telephone Encounter (Signed)
Spoke with both pt and his spouse verbalized understanding of Dr Volanda Napoleon advise and recommendations

## 2020-09-14 DIAGNOSIS — H2513 Age-related nuclear cataract, bilateral: Secondary | ICD-10-CM | POA: Diagnosis not present

## 2020-09-14 DIAGNOSIS — Z794 Long term (current) use of insulin: Secondary | ICD-10-CM | POA: Diagnosis not present

## 2020-09-14 DIAGNOSIS — I6523 Occlusion and stenosis of bilateral carotid arteries: Secondary | ICD-10-CM | POA: Diagnosis not present

## 2020-09-14 DIAGNOSIS — H35033 Hypertensive retinopathy, bilateral: Secondary | ICD-10-CM | POA: Diagnosis not present

## 2020-09-14 DIAGNOSIS — E113411 Type 2 diabetes mellitus with severe nonproliferative diabetic retinopathy with macular edema, right eye: Secondary | ICD-10-CM | POA: Diagnosis not present

## 2020-09-14 DIAGNOSIS — I10 Essential (primary) hypertension: Secondary | ICD-10-CM | POA: Diagnosis not present

## 2020-09-16 ENCOUNTER — Encounter: Payer: Self-pay | Admitting: Family Medicine

## 2020-09-16 DIAGNOSIS — C61 Malignant neoplasm of prostate: Secondary | ICD-10-CM | POA: Insufficient documentation

## 2020-09-17 ENCOUNTER — Encounter (HOSPITAL_BASED_OUTPATIENT_CLINIC_OR_DEPARTMENT_OTHER): Payer: Medicare HMO | Admitting: Physician Assistant

## 2020-09-17 ENCOUNTER — Telehealth: Payer: Self-pay | Admitting: Family Medicine

## 2020-09-17 NOTE — Telephone Encounter (Signed)
Noted  

## 2020-09-17 NOTE — Telephone Encounter (Signed)
Patient is calling and wanted to let Dr. Volanda Napoleon know that she will still maintain care for pt diabetes. CB is [phone]

## 2020-09-19 ENCOUNTER — Encounter (HOSPITAL_COMMUNITY)
Admission: RE | Admit: 2020-09-19 | Discharge: 2020-09-19 | Disposition: A | Discharge status: Home / Self Care | Payer: Medicare HMO | Source: Ambulatory Visit | Attending: Urology | Admitting: Urology

## 2020-09-19 ENCOUNTER — Other Ambulatory Visit: Payer: Self-pay

## 2020-09-19 DIAGNOSIS — C61 Malignant neoplasm of prostate: Secondary | ICD-10-CM | POA: Diagnosis not present

## 2020-09-19 DIAGNOSIS — R972 Elevated prostate specific antigen [PSA]: Secondary | ICD-10-CM | POA: Diagnosis not present

## 2020-09-19 DIAGNOSIS — M170 Bilateral primary osteoarthritis of knee: Secondary | ICD-10-CM | POA: Diagnosis not present

## 2020-09-19 MED ORDER — TECHNETIUM TC 99M MEDRONATE IV KIT
22.0000 | PACK | Freq: Once | INTRAVENOUS | Status: AC
  Administered 2020-09-19: 22 via INTRAVENOUS

## 2020-09-24 ENCOUNTER — Other Ambulatory Visit: Payer: Self-pay

## 2020-09-24 ENCOUNTER — Ambulatory Visit: Payer: Medicare HMO | Admitting: Internal Medicine

## 2020-09-24 ENCOUNTER — Encounter: Payer: Self-pay | Admitting: Family Medicine

## 2020-09-24 ENCOUNTER — Ambulatory Visit (INDEPENDENT_AMBULATORY_CARE_PROVIDER_SITE_OTHER): Payer: Medicare HMO | Admitting: Family Medicine

## 2020-09-24 VITALS — BP 158/82 | HR 78 | Temp 98.0°F | Wt 159.0 lb

## 2020-09-24 DIAGNOSIS — I10 Essential (primary) hypertension: Secondary | ICD-10-CM | POA: Diagnosis not present

## 2020-09-24 DIAGNOSIS — E1142 Type 2 diabetes mellitus with diabetic polyneuropathy: Secondary | ICD-10-CM | POA: Diagnosis not present

## 2020-09-24 DIAGNOSIS — R600 Localized edema: Secondary | ICD-10-CM | POA: Diagnosis not present

## 2020-09-24 DIAGNOSIS — R238 Other skin changes: Secondary | ICD-10-CM | POA: Diagnosis not present

## 2020-09-24 MED ORDER — HYDROCHLOROTHIAZIDE 12.5 MG PO TABS: 12.5000 mg | ORAL_TABLET | Freq: Every day | ORAL | 3 refills | Status: DC

## 2020-09-24 NOTE — Patient Instructions (Addendum)
You should obtain a new blood pressure cuff.  I have sent in a prescription for hydrochlorothiazide 12.5 mg daily to help with your blood pressure and the swelling in your right foot.  You can take this medication first thing in the morning.  Make sure you are drinking plenty of water each day.  Continue wet-to-dry dressing for blisters on both feet.  Keep follow-up appointment with podiatrist. Diabetes Mellitus and Skin Care Diabetes (diabetes mellitus) can lead to health problems over time, including skin problems. People with diabetes have a higher risk for many types of skin complications. This is because having poorly controlled blood sugar (glucose) levels can:  Damage nerves and blood vessels. This can result in decreased feeling in your legs and feet, which means you may not notice minor skin injuries that could lead to serious problems.  Reduce blood flow (circulation), which makes wounds heal more slowly and increases your risk of infection.  Cause areas of skin to become thick or discolored. What are some common skin conditions that affect people with diabetes? Diabetes often causes dry skin. It can also cause the skin on the feet to get thinner, break more easily, and heal more slowly. There are certain skin conditions that commonly affect people who have diabetes, such as:  Bacterial skin infections, such as styes, boils, infected hair follicles, and infections of the skin around the nails.  Fungal skin infections. These are most common in areas where skin rubs together, such as in the armpits or under the breasts.  Open sores, especially on the feet.  Tissue death (gangrene). This can happen on your feet if a serious infection does not heal properly. Gangrene can cause the need for a foot or leg to be surgically removed (amputated). Diabetes can also cause the skin to change. You may develop:  Dark, velvety markings on the skin that usually appear on the face, neck, armpits,  inner thighs, and groin (acanthosis nigricans). This typically affects people of African-American and American-Indian descent.  Red, raised, scar-like tissue that may itch, feel painful, or develop into a wound (necrobiosis lipoidica).  Blisters on feet, toes, hands, or fingers.  Thickened, wax-like areas of skin that usually occur on the hands, forehead, or toes (digital sclerosis).  Brown or red ring-shaped or half-ring-shaped patches of skin on the ears or fingers (disseminated granuloma).  Pea-shaped yellow bumps that may be itchy and surrounded by a red ring (eruptive xanthomatosis). This usually affects the arms, feet, buttocks, and the top of the hands.  Round, discolored patches of tan skin that do not hurt or itch (diabetic dermopathy). These may look like age spots. What do I need to know about itchy skin? It is common for people with diabetes to have itchy skin caused by dryness. Frequent high blood glucose levels can cause itchiness, and poor circulation and certain skin infections can make dry, itchy skin worse. If you have itchy skin that is red or covered in a rash, this could be a sign of an allergic reaction to a medicine. If you have a rash or if your skin is very itchy, contact your health care provider. You may need help to manage your diabetes better, or you may need treatment for an infection. How can I prevent skin breakdown? When you have diabetes and you get a badly infected ulcer or sore that does not heal, your skin can break down, especially if you have poor circulation or are on bed rest. To prevent skin breakdown:  Keep your skin clean and dry. Wash your skin often. Do not use hot water.  Do not use any products that contain nicotine or tobacco, such as cigarettes and e-cigarettes. Smoking affects the body's ability to heal. If you need help quitting, ask your health care provider.  Check your skin every day for cuts, bruises, redness, blisters, or sores,  especially on your feet. Tell your health care provider about any cuts, wounds, or sores you have, especially if they are healing slowly.  If you are on bed rest, try to change positions often. What else do I need to know about taking care of my skin?   To relieve dry skin and itching: ? Limit baths and showers to 5-10 minutes. ? Bathe with lukewarm water instead of hot water. ? Use mild soap and gentle skin cleansers. Do not use soap that is perfumed or harsh or dries your skin. ? Put on lotion as soon as you finish bathing.  Make sure that your health care provider performs a visual foot exam at every medical visit.  Schedule a foot exam with your health care provider once every year. This exam includes an inspection of the structure and skin of your feet.  If you get a skin injury, such as a cut, blister, or sore, check the area every day for signs of infection. Check for: ? More redness, swelling, or pain. ? More fluid or blood. ? Warmth. ? Pus or a bad smell. Contact a health care provider if:  You develop a cut or sore, especially on your feet.  You develop signs of infection after a skin injury.  Your blood glucose level is higher than 240 mg/dL (13.3 mmol/L) for 2 days in a row.  You have itchy skin that develops redness or a rash.  You have discolored areas of skin.  You have areas where your skin is changing, such as thickening or appearing shiny. Summary  Diabetes (diabetes mellitus) can lead to health problems over time, including skin problems.  People with diabetes have a higher risk for many types of skin complications.  Check your skin every day for cuts, bruises, redness, blisters, or sores, especially on your feet.  Tell your health care provider about any cuts, wounds, or sores you have, especially if they are healing slowly. This information is not intended to replace advice given to you by your health care provider. Make sure you discuss any questions  you have with your health care provider. Document Revised: 05/07/2017 Document Reviewed: 03/25/2016 Elsevier Patient Education  [place].  Edema  Edema is when you have too much fluid in your body or under your skin. Edema may make your legs, feet, and ankles swell up. Swelling is also common in looser tissues, like around your eyes. This is a common condition. It gets more common as you get older. There are many possible causes of edema. Eating too much salt (sodium) and being on your feet or sitting for a long time can cause edema in your legs, feet, and ankles. Hot weather may make edema worse. Edema is usually painless. Your skin may look swollen or shiny. Follow these instructions at home:  Keep the swollen body part raised (elevated) above the level of your heart when you are sitting or lying down.  Do not sit still or stand for a long time.  Do not wear tight clothes. Do not wear garters on your upper legs.  Exercise your legs. This can help the  swelling go down.  Wear elastic bandages or support stockings as told by your doctor.  Eat a low-salt (low-sodium) diet to reduce fluid as told by your doctor.  Depending on the cause of your swelling, you may need to limit how much fluid you drink (fluid restriction).  Take over-the-counter and prescription medicines only as told by your doctor. Contact a doctor if:  Treatment is not working.  You have heart, liver, or kidney disease and have symptoms of edema.  You have sudden and unexplained weight gain. Get help right away if:  You have shortness of breath or chest pain.  You cannot breathe when you lie down.  You have pain, redness, or warmth in the swollen areas.  You have heart, liver, or kidney disease and get edema all of a sudden.  You have a fever and your symptoms get worse all of a sudden. Summary  Edema is when you have too much fluid in your body or under your skin.  Edema may make your legs,  feet, and ankles swell up. Swelling is also common in looser tissues, like around your eyes.  Raise (elevate) the swollen body part above the level of your heart when you are sitting or lying down.  Follow your doctor's instructions about diet and how much fluid you can drink (fluid restriction). This information is not intended to replace advice given to you by your health care provider. Make sure you discuss any questions you have with your health care provider. Document Revised: 10/16/2017 Document Reviewed: 10/31/2016 Elsevier Patient Education  2020 [employer].

## 2020-09-24 NOTE — Progress Notes (Signed)
Subjective:    Patient ID: [name], male    DOB: 08/17/1951, 69 y.o.   MRN: [id number]  No chief complaint on file. Patient accompanied by his significant other, [name].  HPI Patient was seen today for follow-up.  Patient notes blister on left foot that burst this morning.  Right foot with edema after standing in kitchen cooking for 3 days.  Patient denies pain in right foot.  Notes some improvement in edema.  Patient is not cooking with salt or adding salt to food. Wearing surgical shoe since last visit after seeing the podiatrist for blisters on that foot on 09/07/2020.  Patient advised to use Betadine and wet-to-dry dressings on the blisters.  Per patient's gf, the foot seemed like it was getting worse.  Pt went to UC, given Keflex and fluconazole the following Tuesday for his foot.  Patient has his blood pressure cuff with him this visit.  BP readings at home 148/67, 164/54, 158/75, 160/79, 101/64, 198/70, 162/72, 133/75, 124/64, 117/71, 187/72.  Past Medical History:  Diagnosis Date  . Diabetes mellitus without complication ([place])   . Insomnia     No Known Allergies  ROS General: Denies fever, chills, night sweats, changes in weight, changes in appetite HEENT: Denies headaches, ear pain, changes in vision, rhinorrhea, sore throat CV: Denies CP, palpitations, SOB, orthopnea Pulm: Denies SOB, cough, wheezing GI: Denies abdominal pain, nausea, vomiting, diarrhea, constipation GU: Denies dysuria, hematuria, frequency Msk: Denies muscle cramps, joint pains Neuro: Denies weakness, numbness, tingling Skin: Denies rashes, bruising  + blister on left second toe Psych: Denies depression, anxiety, hallucinations     Objective:    Blood pressure (!) 158/82, pulse 78, temperature 98 F (36.7 C), temperature source Oral, weight 159 lb (72.1 kg), SpO2 97 %. Initial reading with patient BP cuff 205/76 with pulse 85.  Repeat BP 202/88 with patient's cuff.  Gen. Pleasant,  well-nourished, in no distress, normal affect HEENT: [place]/AT, face symmetric, conjunctiva clear, no scleral icterus, PERRLA, EOMI, nares patent without drainage Lungs: no accessory muscle use, CTAB, no wheezes or rales Cardiovascular: RRR, no m/r/g, right pedal edema.  DP and PT pulses 2+ bilaterally. Musculoskeletal: No deformities, no cyanosis or clubbing, normal tone Neuro:  A&Ox3, CN II-XII intact, normal gait.  Wearing postop surgical shoe on right foot. Skin:  Warm, dry.  Bilateral toes and feet with yellow discoloration 2/2 Betadine use.  Right pedal edema 1+, trace at R shin.  Left second digit of foot with open blister draining serous fluid.   Wt Readings from Last 3 Encounters:  09/24/20 159 lb (72.1 kg)  09/07/20 162 lb 3.2 oz (73.6 kg)  08/24/20 158 lb 9.6 oz (71.9 kg)    Lab Results  Component Value Date   WBC 4.1 09/07/2020   HGB 12.2 (L) 09/07/2020   HCT 35.1 (L) 09/07/2020   PLT 196 09/07/2020   GLUCOSE 201 (H) 09/07/2020   CHOL 137 09/07/2020   TRIG 56 09/07/2020   HDL 53 09/07/2020   LDLCALC 70 09/07/2020   ALT 29 09/07/2020   AST 33 09/07/2020   NA 141 09/07/2020   K 4.2 09/07/2020   CL 106 09/07/2020   CREATININE 1.35 (H) 09/07/2020   BUN 22 09/07/2020   CO2 27 09/07/2020   TSH 1.01 09/07/2020   PSA 6.49 (H) 03/21/2020   HGBA1C 6.6 (H) 09/07/2020   MICROALBUR 13.6 (H) 11/10/2018    Assessment/Plan:  Blisters of multiple sites -Blisters of right foot healing -Open blister of  left second digit without infection -Patient encouraged to continue Betadine and wet-to-dry dressings as instructed by podiatry -Open wound dressed this visit -Encouraged to keep follow-up appointment with podiatrist  Essential hypertension -Elevated -Pt advised to obtain a new BP cuff as his current home cuff is not accurate.  BP in office 160/84 and 158/82.  When taken with home cuff readings 205/76 and 202/88 -Continue current medications including diltiazem 300 mg, Norvasc  10 mg, losartan 100 mg -We will start low-dose HCTZ - Plan: hydrochlorothiazide (HYDRODIURIL) 12.5 MG tablet  Type 2 diabetes mellitus with diabetic polyneuropathy, without long-term current use of insulin ([facility]CC) -Hemoglobin A1c 6.6% -Continue current medications Novolin 70/30 20 units in a.m. 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 -Continue checking FSBS -Continue lifestyle modifications  Pedal edema -Discussed supportive care including elevation -Right foot and LE wrapped with Ace bandage this visit -Patient encouraged to be mindful of sodium intake - Plan: hydrochlorothiazide (HYDRODIURIL) 12.5 MG tablet  F/u in 1 month  Grier Mitts, MD

## 2020-09-26 ENCOUNTER — Encounter (HOSPITAL_BASED_OUTPATIENT_CLINIC_OR_DEPARTMENT_OTHER): Payer: Medicare HMO | Attending: Physician Assistant | Admitting: Physician Assistant

## 2020-09-26 ENCOUNTER — Other Ambulatory Visit: Payer: Self-pay

## 2020-09-26 DIAGNOSIS — E11621 Type 2 diabetes mellitus with foot ulcer: Secondary | ICD-10-CM | POA: Insufficient documentation

## 2020-09-26 DIAGNOSIS — Z8546 Personal history of malignant neoplasm of prostate: Secondary | ICD-10-CM | POA: Insufficient documentation

## 2020-09-26 DIAGNOSIS — L97522 Non-pressure chronic ulcer of other part of left foot with fat layer exposed: Secondary | ICD-10-CM | POA: Insufficient documentation

## 2020-09-26 DIAGNOSIS — I10 Essential (primary) hypertension: Secondary | ICD-10-CM | POA: Diagnosis not present

## 2020-09-26 DIAGNOSIS — L97512 Non-pressure chronic ulcer of other part of right foot with fat layer exposed: Secondary | ICD-10-CM | POA: Diagnosis not present

## 2020-09-26 DIAGNOSIS — L97521 Non-pressure chronic ulcer of other part of left foot limited to breakdown of skin: Secondary | ICD-10-CM | POA: Diagnosis not present

## 2020-09-26 DIAGNOSIS — L84 Corns and callosities: Secondary | ICD-10-CM | POA: Insufficient documentation

## 2020-09-27 ENCOUNTER — Ambulatory Visit: Payer: Medicare HMO | Admitting: Internal Medicine

## 2020-09-27 DIAGNOSIS — L97512 Non-pressure chronic ulcer of other part of right foot with fat layer exposed: Secondary | ICD-10-CM | POA: Diagnosis not present

## 2020-09-27 DIAGNOSIS — L97522 Non-pressure chronic ulcer of other part of left foot with fat layer exposed: Secondary | ICD-10-CM | POA: Diagnosis not present

## 2020-09-27 DIAGNOSIS — E11621 Type 2 diabetes mellitus with foot ulcer: Secondary | ICD-10-CM | POA: Diagnosis not present

## 2020-09-28 NOTE — Progress Notes (Signed)
[name], [name] ([id number]) Visit Report for 09/26/2020 Allergy List Details Patient Name: Date of Service: [name] 09/26/2020 10:30 A M Medical Record Number: [id number] Patient Account Number: [id number] Date of Birth/Sex: Treating RN: 07/29/1951 (69 y.o. [name] Primary Care Marice Guidone: Grier Mitts Other Clinician: Referring Bassam Dresch: Treating Assyria Morreale/Extender: Natalia Leatherwood Weeks in Treatment: 0 Allergies Active Allergies No Known Drug Allergies Allergy Notes Electronic Signature(s) Signed: 09/28/2020 4:47:23 PM By: Levan Hurst RN, BSN Entered By: Levan Hurst on 09/26/2020 11:19:59 -------------------------------------------------------------------------------- Arrival Information Details Patient Name: Date of Service: [name], [name] 09/26/2020 10:30 A M Medical Record Number: [id number] Patient Account Number: [id number] Date of Birth/Sex: Treating RN: Jul 07, 1951 (69 y.o. [name] Primary Care Sema Stangler: Grier Mitts Other Clinician: Referring Kiya Eno: Treating Arnetia Bronk/Extender: Katherine Basset in Treatment: 0 Visit Information Patient Arrived: Ambulatory Arrival Time: 11:13 Accompanied By: girlfriend Transfer Assistance: None Patient Identification Verified: Yes Secondary Verification Process Completed: Yes Patient Requires Transmission-Based Precautions: No Patient Has Alerts: No Electronic Signature(s) Signed: 09/28/2020 4:47:23 PM By: Levan Hurst RN, BSN Entered By: Levan Hurst on 09/26/2020 11:16:59 -------------------------------------------------------------------------------- Clinic Level of Care Assessment Details Patient Name: Date of Service: [name], [name] 09/26/2020 10:30 A M Medical Record Number: [id number] Patient Account Number: [id number] Date of Birth/Sex: Treating RN: 28-May-1951 (69 y.o. Burnadette Pop, Lauren Primary Care Roselyne Stalnaker: Grier Mitts Other  Clinician: Referring Bosco Paparella: Treating Asherah Lavoy/Extender: Natalia Leatherwood Weeks in Treatment: 0 Clinic Level of Care Assessment Items TOOL 1 Quantity Score X- 1 0 Use when EandM and Procedure is performed on INITIAL visit ASSESSMENTS - Nursing Assessment / Reassessment X- 1 20 General Physical Exam (combine w/ comprehensive assessment (listed just below) when performed on new pt. evals) X- 1 25 Comprehensive Assessment (HX, ROS, Risk Assessments, Wounds Hx, etc.) ASSESSMENTS - Wound and Skin Assessment / Reassessment X- 1 10 Dermatologic / Skin Assessment (not related to wound area) ASSESSMENTS - Ostomy and/or Continence Assessment and Care []  - 0 Incontinence Assessment and Management []  - 0 Ostomy Care Assessment and Management (repouching, etc.) PROCESS - Coordination of Care []  - 0 Simple Patient / Family Education for ongoing care X- 1 20 Complex (extensive) Patient / Family Education for ongoing care X- 1 10 Staff obtains Programmer, systems, Records, T Results / Process Orders est []  - 0 Staff telephones HHA, Nursing Homes / Clarify orders / etc []  - 0 Routine Transfer to another Facility (non-emergent condition) []  - 0 Routine Hospital Admission (non-emergent condition) X- 1 15 New Admissions / Biomedical engineer / Ordering NPWT Apligraf, etc. , []  - 0 Emergency Hospital Admission (emergent condition) PROCESS - Special Needs []  - 0 Pediatric / Minor Patient Management []  - 0 Isolation Patient Management []  - 0 Hearing / Language / Visual special needs []  - 0 Assessment of Community assistance (transportation, D/C planning, etc.) []  - 0 Additional assistance / Altered mentation []  - 0 Support Surface(s) Assessment (bed, cushion, seat, etc.) INTERVENTIONS - Miscellaneous []  - 0 External ear exam []  - 0 Patient Transfer (multiple staff / Civil Service fast streamer / Similar devices) []  - 0 Simple Staple / Suture removal (25 or less) []  - 0 Complex Staple  / Suture removal (26 or more) []  - 0 Hypo/Hyperglycemic Management (do not check if billed separately) X- 1 15 Ankle / Brachial Index (ABI) - do not check if billed separately Has the patient been seen at the hospital within the last three years: Yes Total Score: 115 Level  Of Care: New/Established - Level 3 Electronic Signature(s) Signed: 09/27/2020 5:27:06 PM By: Rhae Hammock RN Entered By: Rhae Hammock on 09/26/2020 12:37:27 -------------------------------------------------------------------------------- Lower Extremity Assessment Details Patient Name: Date of Service: [name], [name] 09/26/2020 10:30 A M Medical Record Number: [id number] Patient Account Number: [id number] Date of Birth/Sex: Treating RN: Jan 13, 1951 (69 y.o. [name] Primary Care Raylee Strehl: Grier Mitts Other Clinician: Referring Tamora Huneke: Treating Chrisandra Wiemers/Extender: Natalia Leatherwood Weeks in Treatment: 0 Edema Assessment Assessed: Shirlyn Goltz: No] [Right: No] Edema: [Left: No] [Right: No] Calf Left: Right: Point of Measurement: 37 cm From Medial Instep 33.2 cm 34 cm Ankle Left: Right: Point of Measurement: 12 cm From Medial Instep 21.3 cm 22.5 cm Vascular Assessment Pulses: Dorsalis Pedis Palpable: [Left:Yes] [Right:Yes] Blood Pressure: Brachial: [Left:174] [Right:174] Ankle: [Left:Dorsalis Pedis: 180 1.03] [Right:Dorsalis Pedis: 210 1.21] Electronic Signature(s) Signed: 09/28/2020 4:47:23 PM By: Levan Hurst RN, BSN Entered By: Levan Hurst on 09/26/2020 11:51:31 -------------------------------------------------------------------------------- [place] Details Patient Name: Date of Service: [name], [name] 09/26/2020 10:30 A M Medical Record Number: [id number] Patient Account Number: [id number] Date of Birth/Sex: Treating RN: [birth year] (69 y.o. Burnadette Pop, Lauren Primary Care Carleton Van[place]: Grier Mitts Other Clinician: Referring  Luvena Wentling: Treating Stephany Poorman/Extender: Natalia Leatherwood Weeks in Treatment: 0 Active Inactive Nutrition Nursing Diagnoses: Impaired glucose control: actual or potential Potential for alteratiion in Nutrition/Potential for imbalanced nutrition Goals: Patient/caregiver agrees to and verbalizes understanding of need to obtain nutritional consultation Date Initiated: 09/26/2020 T arget Resolution Date: 10/26/2020 Goal Status: Active Patient/caregiver agrees to and verbalizes understanding of need to use nutritional supplements and/or vitamins as prescribed Date Initiated: 09/26/2020 Target Resolution Date: 10/25/2020 Goal Status: Active Patient/caregiver verbalizes understanding of need to maintain therapeutic glucose control per primary care physician Date Initiated: 09/26/2020 Target Resolution Date: 10/12/2020 Goal Status: Active Interventions: Assess HgA1c results as ordered upon admission and as needed Assess patient nutrition upon admission and as needed per policy Provide education on elevated blood sugars and impact on wound healing Provide education on nutrition Notes: Wound/Skin Impairment Nursing Diagnoses: Impaired tissue integrity Knowledge deficit related to ulceration/compromised skin integrity Goals: Patient will have a decrease in wound volume by X% from date: (specify in notes) Date Initiated: 09/26/2020 Target Resolution Date: 10/26/2020 Goal Status: Active Patient/caregiver will verbalize understanding of skin care regimen Date Initiated: 09/26/2020 Target Resolution Date: 10/26/2020 Goal Status: Active Ulcer/skin breakdown will have a volume reduction of 30% by week 4 Date Initiated: 09/26/2020 Target Resolution Date: 10/30/2020 Goal Status: Active Interventions: Assess patient/caregiver ability to obtain necessary supplies Assess patient/caregiver ability to perform ulcer/skin care regimen upon admission and as needed Assess ulceration(s) every  visit Provide education on ulcer and skin care Notes: Electronic Signature(s) Signed: 09/27/2020 5:27:06 PM By: Rhae Hammock RN Entered By: Rhae Hammock on 09/26/2020 12:09:17 -------------------------------------------------------------------------------- Pain Assessment Details Patient Name: Date of Service: [name], [name] 09/26/2020 10:30 A M Medical Record Number: [id number] Patient Account Number: [id number] Date of Birth/Sex: Treating RN: 08-19-1951 (69 y.o. [name] Primary Care Dierra Riesgo: Grier Mitts Other Clinician: Referring Dianara Smullen: Treating Merie Wulf/Extender: Natalia Leatherwood Weeks in Treatment: 0 Active Problems Location of Pain Severity and Description of Pain Patient Has Paino No Site Locations Pain Management and Medication Current Pain Management: Electronic Signature(s) Signed: 09/28/2020 4:47:23 PM By: Levan Hurst RN, BSN Entered By: Levan Hurst on 09/26/2020 11:29:31 -------------------------------------------------------------------------------- Patient/Caregiver Education Details Patient Name: Date of Service: Lynann Bologna, [name] 12/1/2021andnbsp10:30 [place] Record Number: [id number] Patient Account  Number: [id number] Date of Birth/Gender: Treating RN: May 12, 1951 (68 y.o. Erie Noe Primary Care Physician: Grier Mitts Other Clinician: Referring Physician: Treating Physician/Extender: Katherine Basset in Treatment: 0 Education Assessment Education Provided To: Patient Education Topics Provided Elevated Blood Sugar/ Impact on Healing: Methods: Explain/Verbal Responses: State content correctly Nutrition: Methods: Explain/Verbal Responses: State content correctly Wound/Skin Impairment: Methods: Explain/Verbal Responses: State content correctly Electronic Signature(s) Signed: 09/27/2020 5:27:06 PM By: Rhae Hammock RN Entered By: Rhae Hammock on 09/26/2020  12:09:41 -------------------------------------------------------------------------------- Wound Assessment Details Patient Name: Date of Service: [name], [name] 09/26/2020 10:30 A M Medical Record Number: [id number] Patient Account Number: [id number] Date of Birth/Sex: Treating RN: 1951-04-17 (69 y.o. [name] Primary Care Devion Chriscoe: Grier Mitts Other Clinician: Referring Vuk Skillern: Treating Analeese Andreatta/Extender: Natalia Leatherwood Weeks in Treatment: 0 Wound Status Wound Number: 1 Primary Etiology: Diabetic Wound/Ulcer of the Lower Extremity Wound Location: Left, Dorsal T Second oe Wound Status: Open Wounding Event: Blister Comorbid Hypertension, Type II Diabetes, Osteoarthritis, History: Neuropathy Date Acquired: 09/20/2020 Weeks Of Treatment: 0 Clustered Wound: No Wound Measurements Length: (cm) 1.3 Width: (cm) 1.8 Depth: (cm) 0.1 Area: (cm) 1.838 Volume: (cm) 0.184 % Reduction in Area: % Reduction in Volume: Epithelialization: None Tunneling: No Undermining: No Wound Description Classification: Grade 2 Wound Margin: Flat and Intact Exudate Amount: Medium Exudate Type: Serosanguineous Exudate Color: red, brown Foul Odor After Cleansing: No Slough/Fibrino No Wound Bed Granulation Amount: Large (67-100%) Exposed Structure Granulation Quality: Pink Fascia Exposed: No Necrotic Amount: None Present (0%) Fat Layer (Subcutaneous Tissue) Exposed: Yes Tendon Exposed: No Muscle Exposed: No Joint Exposed: No Bone Exposed: No Electronic Signature(s) Signed: 09/28/2020 4:47:23 PM By: Levan Hurst RN, BSN Entered By: Levan Hurst on 09/26/2020 11:33:07 -------------------------------------------------------------------------------- Wound Assessment Details Patient Name: Date of Service: [name], [name] 09/26/2020 10:30 A M Medical Record Number: [id number] Patient Account Number: [id number] Date of Birth/Sex: Treating RN: Dec 25, 1950 (69  y.o. [name] Primary Care Janita Camberos: Grier Mitts Other Clinician: Referring Latosha Gaylord: Treating Marijah Larranaga/Extender: Natalia Leatherwood Weeks in Treatment: 0 Wound Status Wound Number: 2 Primary Etiology: Diabetic Wound/Ulcer of the Lower Extremity Wound Location: Right, Plantar T Fifth oe Wound Status: Open Wounding Event: Blister Comorbid Hypertension, Type II Diabetes, Osteoarthritis, History: Neuropathy Date Acquired: 09/10/2020 Weeks Of Treatment: 0 Clustered Wound: No Wound Measurements Length: (cm) 1.6 Width: (cm) 1.6 Depth: (cm) 0.1 Area: (cm) 2.011 Volume: (cm) 0.201 % Reduction in Area: % Reduction in Volume: Epithelialization: None Tunneling: No Undermining: No Wound Description Classification: Grade 2 Wound Margin: Flat and Intact Exudate Amount: Medium Exudate Type: Serosanguineous Exudate Color: red, brown Foul Odor After Cleansing: No Slough/Fibrino Yes Wound Bed Granulation Amount: Medium (34-66%) Exposed Structure Granulation Quality: Pink Fascia Exposed: No Necrotic Amount: Medium (34-66%) Fat Layer (Subcutaneous Tissue) Exposed: Yes Necrotic Quality: Adherent Slough Tendon Exposed: No Muscle Exposed: No Joint Exposed: No Bone Exposed: No Electronic Signature(s) Signed: 09/28/2020 4:47:23 PM By: Levan Hurst RN, BSN Entered By: Levan Hurst on 09/26/2020 11:36:21 -------------------------------------------------------------------------------- Wound Assessment Details Patient Name: Date of Service: [name], [name] 09/26/2020 10:30 A M Medical Record Number: [id number] Patient Account Number: [id number] Date of Birth/Sex: Treating RN: Aug 21, 1951 (69 y.o. [name] Primary Care Alexica Schlossberg: Grier Mitts Other Clinician: Referring Antanasia Kaczynski: Treating Dickie Cloe/Extender: Natalia Leatherwood Weeks in Treatment: 0 Wound Status Wound Number: 3 Primary Etiology: Diabetic Wound/Ulcer of the Lower  Extremity Wound Location: Right, Plantar T Second oe Wound Status: Open Wounding Event: Blister  Comorbid Hypertension, Type II Diabetes, Osteoarthritis, History: Neuropathy Date Acquired: 09/10/2020 Weeks Of Treatment: 0 Clustered Wound: No Wound Measurements Length: (cm) 1.9 Width: (cm) 1.1 Depth: (cm) 0.1 Area: (cm) 1.641 Volume: (cm) 0.164 % Reduction in Area: % Reduction in Volume: Epithelialization: None Tunneling: No Undermining: No Wound Description Classification: Grade 2 Wound Margin: Flat and Intact Exudate Amount: Medium Exudate Type: Serosanguineous Exudate Color: red, brown Foul Odor After Cleansing: No Slough/Fibrino No Wound Bed Granulation Amount: Large (67-100%) Exposed Structure Granulation Quality: Pink Fascia Exposed: No Necrotic Amount: None Present (0%) Fat Layer (Subcutaneous Tissue) Exposed: Yes Tendon Exposed: No Muscle Exposed: No Joint Exposed: No Bone Exposed: No Electronic Signature(s) Signed: 09/28/2020 4:47:23 PM By: Levan Hurst RN, BSN Entered By: Levan Hurst on 09/26/2020 11:39:43 -------------------------------------------------------------------------------- Wound Assessment Details Patient Name: Date of Service: [name], [name] 09/26/2020 10:30 A M Medical Record Number: [id number] Patient Account Number: [id number] Date of Birth/Sex: Treating RN: November 12, 1950 (69 y.o. [name] Primary Care Clemens Lachman: Grier Mitts Other Clinician: Referring Nikolette Reindl: Treating Yenny Kosa/Extender: Natalia Leatherwood Weeks in Treatment: 0 Wound Status Wound Number: 4 Primary Etiology: Diabetic Wound/Ulcer of the Lower Extremity Wound Location: Right, Plantar T Great oe Wound Status: Open Wounding Event: Blister Comorbid Hypertension, Type II Diabetes, Osteoarthritis, History: Neuropathy Date Acquired: 09/10/2020 Weeks Of Treatment: 0 Clustered Wound: No Wound Measurements Length: (cm) 1.7 Width: (cm)  0.3 Depth: (cm) 0.1 Area: (cm) 0.401 Volume: (cm) 0.04 % Reduction in Area: % Reduction in Volume: Epithelialization: None Tunneling: No Undermining: No Wound Description Classification: Grade 2 Wound Margin: Flat and Intact Exudate Amount: Medium Exudate Type: Serosanguineous Exudate Color: red, brown Foul Odor After Cleansing: No Slough/Fibrino No Wound Bed Granulation Amount: Large (67-100%) Exposed Structure Granulation Quality: Pink Fascia Exposed: No Necrotic Amount: None Present (0%) Fat Layer (Subcutaneous Tissue) Exposed: Yes Tendon Exposed: No Muscle Exposed: No Joint Exposed: No Bone Exposed: No Electronic Signature(s) Signed: 09/28/2020 4:47:23 PM By: Levan Hurst RN, BSN Entered By: Levan Hurst on 09/26/2020 11:40:50 -------------------------------------------------------------------------------- Wound Assessment Details Patient Name: Date of Service: [name], [name] 09/26/2020 10:30 A M Medical Record Number: [id number] Patient Account Number: [id number] Date of Birth/Sex: Treating RN: 1951-10-11 (69 y.o. [name] Primary Care Arvella Massingale: Other Clinician: Grier Mitts Referring Sparsh Callens: Treating Warwick Nick/Extender: Natalia Leatherwood Weeks in Treatment: 0 Wound Status Wound Number: 5 Primary Etiology: Diabetic Wound/Ulcer of the Lower Extremity Wound Location: Left, Plantar T Fourth oe Wound Status: Open Wounding Event: Blister Comorbid Hypertension, Type II Diabetes, Osteoarthritis, History: Neuropathy Date Acquired: 09/10/2020 Weeks Of Treatment: 0 Clustered Wound: No Wound Measurements Length: (cm) 0.3 Width: (cm) 0.6 Depth: (cm) 0.1 Area: (cm) 0.141 Volume: (cm) 0.014 % Reduction in Area: % Reduction in Volume: Epithelialization: None Tunneling: No Undermining: No Wound Description Classification: Unable to visualize wound bed Wound Margin: Flat and Intact Exudate Amount: Medium Exudate Type:  Serosanguineous Exudate Color: red, brown Foul Odor After Cleansing: No Slough/Fibrino Yes Wound Bed Granulation Amount: None Present (0%) Exposed Structure Necrotic Amount: Large (67-100%) Fascia Exposed: No Necrotic Quality: Eschar Fat Layer (Subcutaneous Tissue) Exposed: No Tendon Exposed: No Muscle Exposed: No Joint Exposed: No Bone Exposed: No Electronic Signature(s) Signed: 09/28/2020 4:47:23 PM By: Levan Hurst RN, BSN Entered By: Levan Hurst on 09/26/2020 11:43:04 -------------------------------------------------------------------------------- Wound Assessment Details Patient Name: Date of Service: [name], [name] 09/26/2020 10:30 A M Medical Record Number: [id number] Patient Account Number: [id number] Date of Birth/Sex: Treating RN: [birth year] (69 y.o. Erie Noe Primary Care  Meryl Ponder: Grier Mitts Other Clinician: Referring Sani Loiseau: Treating Abbott Jasinski/Extender: Natalia Leatherwood Weeks in Treatment: 0 Wound Status Wound Number: 6 Primary Etiology: Diabetic Wound/Ulcer of the Lower Extremity Wound Location: Right T Third oe Wound Status: Open Wounding Event: Blister Comorbid Hypertension, Type II Diabetes, Osteoarthritis, History: Neuropathy Date Acquired: 09/20/2020 Weeks Of Treatment: 0 Clustered Wound: No Wound Measurements Length: (cm) 0.8 Width: (cm) 1.1 Depth: (cm) 0.1 Area: (cm) 0.691 Volume: (cm) 0.069 % Reduction in Area: % Reduction in Volume: Epithelialization: Small (1-33%) Tunneling: No Undermining: No Wound Description Classification: Grade 2 Wound Margin: Distinct, outline attached Exudate Amount: Medium Exudate Type: Serosanguineous Exudate Color: red, brown Foul Odor After Cleansing: No Slough/Fibrino No Wound Bed Granulation Amount: Large (67-100%) Exposed Structure Granulation Quality: Red, Pink Fascia Exposed: No Necrotic Amount: None Present (0%) Fat Layer (Subcutaneous Tissue) Exposed:  No Tendon Exposed: No Muscle Exposed: No Joint Exposed: No Bone Exposed: No Electronic Signature(s) Signed: 09/27/2020 5:27:06 PM By: Rhae Hammock RN Entered By: Rhae Hammock on 09/26/2020 12:27:28 -------------------------------------------------------------------------------- Wound Assessment Details Patient Name: Date of Service: [name], [name] 09/26/2020 10:30 A M Medical Record Number: [id number] Patient Account Number: [id number] Date of Birth/Sex: Treating RN: 22-Jun-1951 (69 y.o. Burnadette Pop, Lauren Primary Care Carma Dwiggins: Grier Mitts Other Clinician: Referring Stuti Sandin: Treating Jerlean Peralta/Extender: Natalia Leatherwood Weeks in Treatment: 0 Wound Status Wound Number: 7 Primary Etiology: Diabetic Wound/Ulcer of the Lower Extremity Wound Location: Left T Third oe Wound Status: Open Wounding Event: Blister Comorbid Hypertension, Type II Diabetes, Osteoarthritis, History: Neuropathy Date Acquired: 09/20/2020 Weeks Of Treatment: 0 Clustered Wound: No Wound Measurements Length: (cm) 0.9 Width: (cm) 0.6 Depth: (cm) 0.1 Area: (cm) 0.424 Volume: (cm) 0.042 % Reduction in Area: % Reduction in Volume: Epithelialization: Medium (34-66%) Tunneling: No Undermining: No Wound Description Classification: Grade 2 Wound Margin: Distinct, outline attached Exudate Amount: Medium Exudate Type: Serosanguineous Exudate Color: red, brown Foul Odor After Cleansing: No Slough/Fibrino No Wound Bed Granulation Amount: Large (67-100%) Exposed Structure Granulation Quality: Pink Fascia Exposed: No Necrotic Amount: None Present (0%) Fat Layer (Subcutaneous Tissue) Exposed: No Tendon Exposed: No Muscle Exposed: No Joint Exposed: No Bone Exposed: No Electronic Signature(s) Signed: 09/27/2020 5:27:06 PM By: Rhae Hammock RN Entered By: Rhae Hammock on 09/26/2020  12:35:21 -------------------------------------------------------------------------------- Vitals Details Patient Name: Date of Service: [name], [name] 09/26/2020 10:30 A M Medical Record Number: [id number] Patient Account Number: [id number] Date of Birth/Sex: Treating RN: [birth year] (69 y.o. [name] Primary Care Cashius Grandstaff: Grier Mitts Other Clinician: Referring Marce Schartz: Treating Fujie Dickison/Extender: Natalia Leatherwood Weeks in Treatment: 0 Vital Signs Time Taken: 11:17 Temperature (F): 98.3 Height (in): 66 Pulse (bpm): 76 Source: Stated Respiratory Rate (breaths/min): 16 Weight (lbs): 159 Blood Pressure (mmHg): 174/79 Source: Stated Capillary Blood Glucose (mg/dl): 137 Body Mass Index (BMI): 25.7 Reference Range: 80 - 120 mg / dl Notes glucose per pt report Electronic Signature(s) Signed: 09/26/2020 4:46:53 PM By: Carlene Coria RN Entered By: Carlene Coria on 09/26/2020 12:55:12

## 2020-09-28 NOTE — Progress Notes (Signed)
[name], [name] ([id number]) Visit Report for 09/26/2020 Abuse/Suicide Risk Screen Details Patient Name: Date of Service: [name] 09/26/2020 10:30 A M Medical Record Number: [id number] Patient Account Number: [id number] Date of Birth/Sex: Treating RN: [birth year] (69 y.o. [name] Primary Care Adianna Darwin: Grier Mitts Other Clinician: Referring Chong January: Treating Laurie Lovejoy/Extender: Natalia Leatherwood Weeks in Treatment: 0 Abuse/Suicide Risk Screen Items Answer ABUSE RISK SCREEN: Has anyone close to you tried to hurt or harm you recentlyo No Do you feel uncomfortable with anyone in your familyo No Has anyone forced you do things that you didnt want to doo No Electronic Signature(s) Signed: 09/28/2020 4:47:23 PM By: Levan Hurst RN, BSN Entered By: Levan Hurst on 09/26/2020 11:28:11 -------------------------------------------------------------------------------- Activities of Daily Living Details Patient Name: Date of Service: [name] 09/26/2020 10:30 A M Medical Record Number: [id number] Patient Account Number: [id number] Date of Birth/Sex: Treating RN: [birth year] (69 y.o. [name] Primary Care Ardythe Klute: Grier Mitts Other Clinician: Referring Emerald Shor: Treating Zachrey Deutscher/Extender: Natalia Leatherwood Weeks in Treatment: 0 Activities of Daily Living Items Answer Activities of Daily Living (Please select one for each item) Drive Automobile Completely Able T Medications ake Completely Able Use T elephone Completely Able Care for Appearance Completely Able Use T oilet Completely Able Bath / Shower Completely Able Dress Self Completely Able Feed Self Completely Able Walk Completely Able Get In / Out Bed Completely Able Housework Completely Able Prepare Meals Completely [place] for Self Completely Able Electronic Signature(s) Signed: 09/28/2020 4:47:23 PM By: Levan Hurst RN,  BSN Entered By: Levan Hurst on 09/26/2020 11:28:33 -------------------------------------------------------------------------------- Education Screening Details Patient Name: Date of Service: [name] 09/26/2020 10:30 A M Medical Record Number: [id number] Patient Account Number: [id number] Date of Birth/Sex: Treating RN: [birth year] (69 y.o. [name] Primary Care Sanjeev Main: Grier Mitts Other Clinician: Referring Safina Huard: Treating Burnette Sautter/Extender: Katherine Basset in Treatment: 0 Primary Learner Assessed: Patient Learning Preferences/Education Level/Primary Language Learning Preference: Explanation, Demonstration, Printed Material Highest Education Level: High School Preferred Language: English Cognitive Barrier Language Barrier: No Translator Needed: No Memory Deficit: No Emotional Barrier: No Cultural/Religious Beliefs Affecting Medical Care: No Physical Barrier Impaired Vision: No Impaired Hearing: No Decreased Hand dexterity: No Knowledge/Comprehension Knowledge Level: High Comprehension Level: High Ability to understand written instructions: High Ability to understand verbal instructions: High Motivation Anxiety Level: Calm Cooperation: Cooperative Education Importance: Acknowledges Need Interest in Health Problems: Asks Questions Perception: Coherent Willingness to Engage in Self-Management High Activities: Readiness to Engage in Self-Management High Activities: Electronic Signature(s) Signed: 09/28/2020 4:47:23 PM By: Levan Hurst RN, BSN Entered By: Levan Hurst on 09/26/2020 11:29:02 -------------------------------------------------------------------------------- Fall Risk Assessment Details Patient Name: Date of Service: [name] 09/26/2020 10:30 A M Medical Record Number: [id number] Patient Account Number: [id number] Date of Birth/Sex: Treating RN: [birth year] (69 y.o. [name] Primary  Care Lealon Vanputten: Grier Mitts Other Clinician: Referring Lorae Roig: Treating Kolten Ryback/Extender: Natalia Leatherwood Weeks in Treatment: 0 Fall Risk Assessment Items Have you had 2 or more falls in the last 12 monthso 0 No Have you had any fall that resulted in injury in the last 12 monthso 0 No FALLS RISK SCREEN History of falling - immediate or within 3 months 0 No Secondary diagnosis (Do you have 2 or more medical diagnoseso) 15 Yes Ambulatory aid None/bed rest/wheelchair/nurse 0 Yes Crutches/cane/walker 0 No Furniture 0 No Intravenous therapy Access/Saline/Heparin Lock 0 No Gait/Transferring Normal/ bed rest/  wheelchair 0 Yes Weak (short steps with or without shuffle, stooped but able to lift head while walking, may seek 0 No support from furniture) Impaired (short steps with shuffle, may have difficulty arising from chair, head down, impaired 0 No balance) Mental Status Oriented to own ability 0 Yes Electronic Signature(s) Signed: 09/28/2020 4:47:23 PM By: Levan Hurst RN, BSN Entered By: Levan Hurst on 09/26/2020 11:29:15 -------------------------------------------------------------------------------- Foot Assessment Details Patient Name: Date of Service: [name] 09/26/2020 10:30 A M Medical Record Number: [id number] Patient Account Number: [id number] Date of Birth/Sex: Treating RN: 1951/10/16 (69 y.o. [name] Primary Care Shanica Castellanos: Grier Mitts Other Clinician: Referring Zaniyah Wernette: Treating Cordarrell Sane/Extender: Natalia Leatherwood Weeks in Treatment: 0 Foot Assessment Items Site Locations + = Sensation present, - = Sensation absent, C = Callus, U = Ulcer R = Redness, W = Warmth, M = Maceration, PU = Pre-ulcerative lesion F = Fissure, S = Swelling, D = Dryness Assessment Right: Left: Other Deformity: No No Prior Foot Ulcer: No No Prior Amputation: No No Charcot Joint: No No Ambulatory Status: Ambulatory Without  Help Gait: Steady Electronic Signature(s) Signed: 09/28/2020 4:47:23 PM By: Levan Hurst RN, BSN Entered By: Levan Hurst on 09/26/2020 11:52:08 -------------------------------------------------------------------------------- Nutrition Risk Screening Details Patient Name: Date of Service: [name] 09/26/2020 10:30 A M Medical Record Number: [id number] Patient Account Number: [id number] Date of Birth/Sex: Treating RN: 03-11-1951 (69 y.o. [name] Primary Care Zaydin Billey: Grier Mitts Other Clinician: Referring Andrews Tener: Treating Kendre Sires/Extender: Natalia Leatherwood Weeks in Treatment: 0 Height (in): 66 Weight (lbs): 159 Body Mass Index (BMI): 25.7 Nutrition Risk Screening Items Score Screening NUTRITION RISK SCREEN: I have an illness or condition that made me change the kind and/or amount of food I eat 2 Yes I eat fewer than two meals per day 0 No I eat few fruits and vegetables, or milk products 0 No I have three or more drinks of beer, liquor or wine almost every day 0 No I have tooth or mouth problems that make it hard for me to eat 0 No I don't always have enough money to buy the food I need 0 No I eat alone most of the time 0 No I take three or more different prescribed or over-the-counter drugs a day 1 Yes Without wanting to, I have lost or gained 10 pounds in the last six months 0 No I am not always physically able to shop, cook and/or feed myself 0 No Nutrition Protocols Good Risk Protocol Moderate Risk Protocol 0 Provide education on nutrition High Risk Proctocol Risk Level: Moderate Risk Score: 3 Electronic Signature(s) Signed: 09/28/2020 4:47:23 PM By: Levan Hurst RN, BSN Entered By: Levan Hurst on 09/26/2020 11:29:23

## 2020-09-30 ENCOUNTER — Other Ambulatory Visit: Payer: Self-pay | Admitting: Family Medicine

## 2020-09-30 DIAGNOSIS — I10 Essential (primary) hypertension: Secondary | ICD-10-CM

## 2020-10-02 DIAGNOSIS — R69 Illness, unspecified: Secondary | ICD-10-CM | POA: Diagnosis not present

## 2020-10-03 ENCOUNTER — Other Ambulatory Visit: Payer: Self-pay

## 2020-10-03 ENCOUNTER — Encounter (HOSPITAL_BASED_OUTPATIENT_CLINIC_OR_DEPARTMENT_OTHER): Payer: Medicare HMO | Admitting: Physician Assistant

## 2020-10-03 DIAGNOSIS — E11621 Type 2 diabetes mellitus with foot ulcer: Secondary | ICD-10-CM | POA: Diagnosis not present

## 2020-10-03 DIAGNOSIS — L97512 Non-pressure chronic ulcer of other part of right foot with fat layer exposed: Secondary | ICD-10-CM | POA: Diagnosis not present

## 2020-10-03 NOTE — Progress Notes (Addendum)
[name] ([id number]) Visit Report for 10/03/2020 Chief Complaint Document Details Patient Name: Date of Service: [name] 10/03/2020 8:30 [name] M Medical Record Number: [id number] Patient Account Number: [id number] Date of Birth/Sex: Treating RN: [birth year] (69 y.o. [name] Primary Care Provider: Grier [name] Other Clinician: Referring Provider: Treating Provider/Extender: [name] in Treatment: 1 Information Obtained from: Patient Chief Complaint Bilateral T Ulcers oe Electronic Signature(s) Signed: 10/03/2020 8:28:04 AM By: [name] Keeler PA-C Entered By: [name] on 10/03/2020 14:43:15 -------------------------------------------------------------------------------- Debridement Details Patient Name: Date of Service: [name], [name] 10/03/2020 8:30 [name] M Medical Record Number: [id number] Patient Account Number: [id number] Date of Birth/Sex: Treating RN: 25-Jun-1951 (69 y.o. [name] Primary Care Provider: Grier [name] Other Clinician: Referring Provider: Treating Provider/Extender: [name] in Treatment: 1 Debridement Performed for Assessment: Wound #4 Right,Plantar T Great oe Performed By: Physician [name] Keeler, PA Debridement Type: Debridement Severity of Tissue Pre Debridement: Fat layer exposed Level of Consciousness (Pre-procedure): Awake and Alert Pre-procedure Verification/Time Out Yes - 09:20 Taken: Start Time: 09:35 Pain Control: Other : benzocaine 20% spray T Area Debrided (L x W): otal 2.3 (cm) x 2.4 (cm) = 5.52 (cm) Tissue and other material debrided: Viable, Non-Viable, Slough, Subcutaneous, Slough Level: Skin/Subcutaneous Tissue Debridement Description: Excisional Instrument: Curette Bleeding: Minimum Hemostasis Achieved: Pressure End Time: 09:39 Procedural Pain: 0 Post Procedural Pain: 0 Response to Treatment: Procedure was tolerated well Level of  Consciousness (Post- Awake and Alert procedure): Post Debridement Measurements of Total Wound Length: (cm) 2.3 Width: (cm) 2.4 Depth: (cm) 0.1 Volume: (cm) 0.434 Character of Wound/Ulcer Post Debridement: Improved Severity of Tissue Post Debridement: Fat layer exposed Post Procedure Diagnosis Same as Pre-procedure Electronic Signature(s) Signed: 10/03/2020 4:01:55 PM By: [name] Keeler PA-C Signed: 10/03/2020 6:12:31 PM By: [name] Gouty RN, BSN Entered By: [name] on 10/03/2020 09:37:18 -------------------------------------------------------------------------------- Debridement Details Patient Name: Date of Service: [name], [name] 10/03/2020 8:30 [name] M Medical Record Number: [id number] Patient Account Number: [id number] Date of Birth/Sex: Treating RN: Nov 15, 1950 (69 y.o. [name] Primary Care Provider: Grier [name] Other Clinician: Referring Provider: Treating Provider/Extender: [name] in Treatment: 1 Debridement Performed for Assessment: Wound #2 Right,Plantar T Fifth oe Performed By: Physician [name] Keeler, PA Debridement Type: Debridement Severity of Tissue Pre Debridement: Fat layer exposed Level of Consciousness (Pre-procedure): Awake and Alert Pre-procedure Verification/Time Out Yes - 09:20 Taken: Start Time: 09:35 Pain Control: Other : benzocaine 20% spray T Area Debrided (L x W): otal 1 (cm) x 1 (cm) = 1 (cm) Tissue and other material debrided: Viable, Non-Viable, Slough, Subcutaneous, Slough Level: Skin/Subcutaneous Tissue Debridement Description: Excisional Instrument: Curette Bleeding: Minimum Hemostasis Achieved: Pressure End Time: 09:39 Procedural Pain: 0 Post Procedural Pain: 0 Response to Treatment: Procedure was tolerated well Level of Consciousness (Post- Awake and Alert procedure): Post Debridement Measurements of Total Wound Length: (cm) 1 Width: (cm) 1 Depth: (cm) 0.1 Volume: (cm)  0.079 Character of Wound/Ulcer Post Debridement: Improved Severity of Tissue Post Debridement: Fat layer exposed Post Procedure Diagnosis Same as Pre-procedure Electronic Signature(s) Signed: 10/03/2020 4:01:55 PM By: [name] Keeler PA-C Signed: 10/03/2020 6:12:31 PM By: [name] Gouty RN, BSN Entered By: [name] on 10/03/2020 09:40:30 -------------------------------------------------------------------------------- HPI Details Patient Name: Date of Service: [name], [name] 10/03/2020 8:30 [name] M Medical Record Number: [id number] Patient Account Number: [id number] Date of Birth/Sex: Treating RN: 1950/12/04 (69 y.o. [name] Primary  Care Provider: Grier [name] Other Clinician: Referring Provider: Treating Provider/Extender: [name] in Treatment: 1 History of Present Illness HPI Description: 09/26/2020 on evaluation today patient appears to be doing somewhat poorly in regard to his bilateral feet at this point due to issues that he is been having that developed about 2 [name] ago. He was walking home which was about [name] mile and states that he first noted the areas on the right foot beginning. These were blisters that develop. The left started Thanksgiving day when he was up standing more cooking. He did see his primary care provider who referred him to podiatry. He saw [name] and [name] recommended Betadine moistened gauze dressings to the wounds and to follow-up in 2 [name]. With that being said the patient subsequently was concerned about infection 2 to 3 days later as was his family member who is with him today. I believe this was [name] sister. With that being said subsequently he ended up going to urgent care at that point he was given Diflucan and Keflex he is done with both at this time. He is could be canceling the appointment with [name] they tell me at this point. His most recent hemoglobin A1c was 6.6 on November 12. His ABIs were 1.2  on the right and 1.03 on the left and appear to be doing excellent. With that being said he does not have diabetic shoes and I feel like the shoes were likely the culprit for what led to this issue currently. There does not appear to be any signs of systemic infection nor local infection at this point which is great news. The patient does have [name] history of hypertension as well as potentially [name] recurrence of prostate cancer he is being followed by the cancer center for that at this point 10/03/2020 upon evaluation today patient appears to be doing better in regard to his toe ulcers. He has been tolerating the dressing changes without complication. Fortunately there is no signs of active infection at this time. The right great toe and fifth toe are the 2 worst out of everything here he has [name] couple that have healed on the left foot there is really nothing remaining open though he has several areas of callus on the left foot in fact 3 on the third fourth and fifth toes. That is going need to be removed today as well. Electronic Signature(s) Signed: 10/03/2020 9:45:29 AM By: [name] Keeler PA-C Entered By: [name] on 10/03/2020 09:45:29 -------------------------------------------------------------------------------- Paring/cutting of benign hyperkeratotic lesion 2-4 Details Patient Name: Date of Service: [name] 10/03/2020 8:30 [name] M Medical Record Number: [id number] Patient Account Number: [id number] Date of Birth/Sex: Treating RN: 1951-10-08 (69 y.o. [name] Primary Care Provider: Grier [name] Other Clinician: Referring Provider: Treating Provider/Extender: [name] in Treatment: 1 Procedure Performed for: Non-Wound Location Performed By: Physician [name] Keeler, PA Post Procedure Diagnosis Same as Pre-procedure Notes left 3rd, 4th and 5th toes using #3 curette Electronic Signature(s) Signed: 10/03/2020 4:01:55 PM By: [name] Keeler  PA-C Signed: 10/03/2020 6:12:31 PM By: [name] Gouty RN, BSN Entered By: [name] on 10/03/2020 09:27:23 -------------------------------------------------------------------------------- Physical Exam Details Patient Name: Date of Service: [name], [name] 10/03/2020 8:30 [name] M Medical Record Number: [id number] Patient Account Number: [id number] Date of Birth/Sex: Treating RN: [birth year] (69 y.o. [name] Primary Care Provider: Grier [name] Other Clinician: Referring Provider: Treating Provider/Extender: Cala Bradford,  [place] [name] in Treatment: 1 Constitutional Well-nourished and well-hydrated in no acute distress. Respiratory normal breathing without difficulty. Psychiatric this patient is able to make decisions and demonstrates good insight into disease process. Alert and Oriented x 3. pleasant and cooperative. Notes Upon inspection patient's wound bed actually showed signs on the right foot of needing some sharp debridement in regard to the great toe and fifth toe I did perform debridement of both today without complication post debridement wound bed appears to be doing much better which is great news. With that being said on the left foot I did have to actually perform [name] callus paring of the third, fourth, and fifth toes where it had some rubbing and blistering and then extremely thickened skin. That came off fairly easily and post paring this seems to be doing much better. Electronic Signature(s) Signed: 10/03/2020 9:46:06 AM By: [name] Keeler PA-C Entered By: [name] on 10/03/2020 09:46:05 -------------------------------------------------------------------------------- Physician Orders Details Patient Name: Date of Service: [name], [name] 10/03/2020 8:30 [name] M Medical Record Number: [id number] Patient Account Number: [id number] Date of Birth/Sex: Treating RN: [birth year] (69 y.o. [name] Primary Care Provider: Grier [name] Other  Clinician: Referring Provider: Treating Provider/Extender: [name] in Treatment: 1 Verbal / Phone Orders: No Diagnosis Coding ICD-10 Coding Code Description E11.621 Type 2 diabetes mellitus with foot ulcer L97.522 Non-pressure chronic ulcer of other part of left foot with fat layer exposed L97.512 Non-pressure chronic ulcer of other part of right foot with fat layer exposed I10 Essential (primary) hypertension N42.9 Disorder of prostate, unspecified Follow-up Appointments Return Appointment in 1 week. Bathing/ Shower/ Hygiene May shower and wash wound with soap and water. Off-Loading Open toe surgical shoe to: - both feet Wound Treatment Wound #1 - T Second oe Wound Laterality: Dorsal, Left Peri-Wound Care: Sween Lotion (Moisturizing lotion) Every Other Day/30 Days Discharge Instructions: Apply moisturizing lotion to feet with dressing changes Prim Dressing: KerraCel Ag Gelling Fiber Dressing, 2x2 in (silver alginate) Every Other Day/30 Days ary Discharge Instructions: Apply silver alginate to wound bed as instructed Secondary Dressing: Woven Gauze Sponges 2x2 in Every Other Day/30 Days Discharge Instructions: Apply over primary dressing as directed. Secured With: Child psychotherapist, Sterile 2x75 (in/in) Every Other Day/30 Days Discharge Instructions: Secure with stretch gauze as directed. Wound #2 - T Fifth oe Wound Laterality: Plantar, Right Peri-Wound Care: Sween Lotion (Moisturizing lotion) Every Other Day/30 Days Discharge Instructions: Apply moisturizing lotion to feet with dressing changes Prim Dressing: KerraCel Ag Gelling Fiber Dressing, 2x2 in (silver alginate) Every Other Day/30 Days ary Discharge Instructions: Apply silver alginate to wound bed as instructed Secondary Dressing: Woven Gauze Sponges 2x2 in Every Other Day/30 Days Discharge Instructions: Apply over primary dressing as directed. Secured With: Hotel manager, Sterile 2x75 (in/in) Every Other Day/30 Days Discharge Instructions: Secure with stretch gauze as directed. Wound #3 - T Second oe Wound Laterality: Plantar, Right Peri-Wound Care: Sween Lotion (Moisturizing lotion) Every Other Day/30 Days Discharge Instructions: Apply moisturizing lotion to feet with dressing changes Prim Dressing: KerraCel Ag Gelling Fiber Dressing, 2x2 in (silver alginate) Every Other Day/30 Days ary Discharge Instructions: Apply silver alginate to wound bed as instructed Secondary Dressing: Woven Gauze Sponges 2x2 in Every Other Day/30 Days Discharge Instructions: Apply over primary dressing as directed. Secured With: Child psychotherapist, Sterile 2x75 (in/in) Every Other Day/30 Days Discharge Instructions: Secure with stretch gauze as directed. Wound #4 - T Great oe Wound  Laterality: Plantar, Right Peri-Wound Care: Sween Lotion (Moisturizing lotion) Every Other Day/30 Days Discharge Instructions: Apply moisturizing lotion to feet with dressing changes Prim Dressing: KerraCel Ag Gelling Fiber Dressing, 2x2 in (silver alginate) Every Other Day/30 Days ary Discharge Instructions: Apply silver alginate to wound bed as instructed Secondary Dressing: Woven Gauze Sponges 2x2 in Every Other Day/30 Days Discharge Instructions: Apply over primary dressing as directed. Secured With: Child psychotherapist, Sterile 2x75 (in/in) Every Other Day/30 Days Discharge Instructions: Secure with stretch gauze as directed. Wound #7 - T Third oe Wound Laterality: Left Peri-Wound Care: Sween Lotion (Moisturizing lotion) Every Other Day/30 Days Discharge Instructions: Apply moisturizing lotion to feet with dressing changes Prim Dressing: KerraCel Ag Gelling Fiber Dressing, 2x2 in (silver alginate) Every Other Day/30 Days ary Discharge Instructions: Apply silver alginate to wound bed as instructed Secondary Dressing: Woven Gauze Sponges 2x2 in  Every Other Day/30 Days Discharge Instructions: Apply over primary dressing as directed. Secured With: Child psychotherapist, Sterile 2x75 (in/in) Every Other Day/30 Days Discharge Instructions: Secure with stretch gauze as directed. Electronic Signature(s) Signed: 10/03/2020 4:01:55 PM By: [name] Keeler PA-C Signed: 10/03/2020 6:12:31 PM By: [name] Gouty RN, BSN Entered By: [name] on 10/03/2020 09:31:36 -------------------------------------------------------------------------------- Problem List Details Patient Name: Date of Service: [name], [name] 10/03/2020 8:30 [name] M Medical Record Number: [id number] Patient Account Number: [id number] Date of Birth/Sex: Treating RN: January 04, 1951 (69 y.o. [name] Primary Care Provider: Grier [name] Other Clinician: Referring Provider: Treating Provider/Extender: [name] in Treatment: 1 Active Problems ICD-10 Encounter Code Description Active Date MDM Diagnosis E11.621 Type 2 diabetes mellitus with foot ulcer 09/26/2020 No Yes L97.522 Non-pressure chronic ulcer of other part of left foot with fat layer exposed 09/26/2020 No Yes L97.512 Non-pressure chronic ulcer of other part of right foot with fat layer exposed 09/26/2020 No Yes I10 Essential (primary) hypertension 09/26/2020 No Yes N42.9 Disorder of prostate, unspecified 09/26/2020 No Yes L84 Corns and callosities 10/03/2020 No Yes Inactive Problems Resolved Problems Electronic Signature(s) Signed: 10/03/2020 9:48:09 AM By: [name] Keeler PA-C Previous Signature: 10/03/2020 8:27:58 AM Version By: [name] Keeler PA-C Entered By: [name] on 10/03/2020 09:48:08 -------------------------------------------------------------------------------- Progress Note Details Patient Name: Date of Service: [name], [name] 10/03/2020 8:30 [name] M Medical Record Number: [id number] Patient Account Number: [id number] Date of Birth/Sex: Treating  RN: Aug 23, 1951 (69 y.o. [name] Primary Care Provider: Grier [name] Other Clinician: Referring Provider: Treating Provider/Extender: [name] in Treatment: 1 Subjective Chief Complaint Information obtained from Patient Bilateral T Ulcers oe History of Present Illness (HPI) 09/26/2020 on evaluation today patient appears to be doing somewhat poorly in regard to his bilateral feet at this point due to issues that he is been having that developed about 2 [name] ago. He was walking home which was about [name] mile and states that he first noted the areas on the right foot beginning. These were blisters that develop. The left started Thanksgiving day when he was up standing more cooking. He did see his primary care provider who referred him to podiatry. He saw [name] and [name] recommended Betadine moistened gauze dressings to the wounds and to follow-up in 2 [name]. With that being said the patient subsequently was concerned about infection 2 to 3 days later as was his family member who is with him today. I believe this was [name] sister. With that being said subsequently he ended up going to  urgent care at that point he was given Diflucan and Keflex he is done with both at this time. He is could be canceling the appointment with [name] they tell me at this point. His most recent hemoglobin A1c was 6.6 on November 12. His ABIs were 1.2 on the right and 1.03 on the left and appear to be doing excellent. With that being said he does not have diabetic shoes and I feel like the shoes were likely the culprit for what led to this issue currently. There does not appear to be any signs of systemic infection nor local infection at this point which is great news. The patient does have [name] history of hypertension as well as potentially [name] recurrence of prostate cancer he is being followed by the cancer center for that at this point 10/03/2020 upon evaluation today patient  appears to be doing better in regard to his toe ulcers. He has been tolerating the dressing changes without complication. Fortunately there is no signs of active infection at this time. The right great toe and fifth toe are the 2 worst out of everything here he has [name] couple that have healed on the left foot there is really nothing remaining open though he has several areas of callus on the left foot in fact 3 on the third fourth and fifth toes. That is going need to be removed today as well. Objective Constitutional Well-nourished and well-hydrated in no acute distress. Vitals Time Taken: 8:40 AM, Height: 66 in, Weight: 159 lbs, BMI: 25.7, Temperature: 97.6 F, Pulse: 73 bpm, Respiratory Rate: 18 breaths/min, Blood Pressure: 169/76 mmHg. Respiratory normal breathing without difficulty. Psychiatric this patient is able to make decisions and demonstrates good insight into disease process. Alert and Oriented x 3. pleasant and cooperative. General Notes: Upon inspection patient's wound bed actually showed signs on the right foot of needing some sharp debridement in regard to the great toe and fifth toe I did perform debridement of both today without complication post debridement wound bed appears to be doing much better which is great news. With that being said on the left foot I did have to actually perform [name] callus paring of the third, fourth, and fifth toes where it had some rubbing and blistering and then extremely thickened skin. That came off fairly easily and post paring this seems to be doing much better. Integumentary (Hair, Skin) Wound #1 status is Open. Original cause of wound was Blister. The wound is located on the Left,Dorsal T Second. The wound measures 0.4cm length x oe 0.2cm width x 0.1cm depth; 0.063cm^2 area and 0.006cm^3 volume. There is Fat Layer (Subcutaneous Tissue) exposed. There is no tunneling or undermining noted. There is [name] medium amount of serosanguineous drainage  noted. The wound margin is flat and intact. There is large (67-100%) pink granulation within the wound bed. There is no necrotic tissue within the wound bed. Wound #2 status is Open. Original cause of wound was Blister. The wound is located on the Right,Plantar T Fifth. The wound measures 1cm length x 1cm oe width x 0.1cm depth; 0.785cm^2 area and 0.079cm^3 volume. There is Fat Layer (Subcutaneous Tissue) exposed. There is no tunneling or undermining noted. There is [name] medium amount of serosanguineous drainage noted. The wound margin is flat and intact. There is medium (34-66%) pink granulation within the wound bed. There is [name] medium (34-66%) amount of necrotic tissue within the wound bed including Adherent Slough. Wound #3 status is Open. Original cause of wound was  Blister. The wound is located on the Right,Plantar T Second. The wound measures 0.9cm length x oe 1.1cm width x 1cm depth; 0.778cm^2 area and 0.778cm^3 volume. There is Fat Layer (Subcutaneous Tissue) exposed. There is no tunneling or undermining noted. There is [name] medium amount of serosanguineous drainage noted. The wound margin is flat and intact. There is large (67-100%) pink granulation within the wound bed. There is no necrotic tissue within the wound bed. Wound #4 status is Open. Original cause of wound was Blister. The wound is located on the [employer]. The wound measures 2.3cm length x oe 2.4cm width x 0.1cm depth; 4.335cm^2 area and 0.434cm^3 volume. There is Fat Layer (Subcutaneous Tissue) exposed. There is no tunneling or undermining noted. There is [name] medium amount of serosanguineous drainage noted. The wound margin is flat and intact. There is large (67-100%) pink granulation within the wound bed. There is no necrotic tissue within the wound bed. Wound #5 status is Open. Original cause of wound was Blister. The wound is located on the Left,Plantar T Fourth. The wound measures 0cm length x 0cm oe width x 0cm  depth; 0cm^2 area and 0cm^3 volume. There is no tunneling or undermining noted. There is [name] medium amount of serosanguineous drainage noted. The wound margin is flat and intact. There is no granulation within the wound bed. There is no necrotic tissue within the wound bed. Wound #6 status is Open. Original cause of wound was Blister. The wound is located on the Right T Third. The wound measures 0cm length x 0cm width x oe 0cm depth; 0cm^2 area and 0cm^3 volume. There is no tunneling or undermining noted. There is [name] medium amount of serosanguineous drainage noted. The wound margin is distinct with the outline attached to the wound base. There is no granulation within the wound bed. There is no necrotic tissue within the wound bed. Wound #7 status is Open. Original cause of wound was Blister. The wound is located on the Left T Third. The wound measures 0.2cm length x 0.2cm width x oe 0.1cm depth; 0.031cm^2 area and 0.003cm^3 volume. There is no tunneling or undermining noted. There is [name] medium amount of serosanguineous drainage noted. The wound margin is distinct with the outline attached to the wound base. There is large (67-100%) pink granulation within the wound bed. There is no necrotic tissue within the wound bed. Assessment Active Problems ICD-10 Type 2 diabetes mellitus with foot ulcer Non-pressure chronic ulcer of other part of left foot with fat layer exposed Non-pressure chronic ulcer of other part of right foot with fat layer exposed Essential (primary) hypertension Disorder of prostate, unspecified Corns and callosities Procedures Wound #2 Pre-procedure diagnosis of Wound #2 is [name] Diabetic Wound/Ulcer of the Lower Extremity located on the Right,Plantar T Fifth .Severity of Tissue Pre oe Debridement is: Fat layer exposed. There was [name] Excisional Skin/Subcutaneous Tissue Debridement with [name] total area of 1 sq cm performed by [name] Keeler, PA. With the following instrument(s): Curette  to remove Viable and Non-Viable tissue/material. Material removed includes Subcutaneous Tissue and Slough and after achieving pain control using Other (benzocaine 20% spray). No specimens were taken. [name] time out was conducted at 09:20, prior to the start of the procedure. [name] Minimum amount of bleeding was controlled with Pressure. The procedure was tolerated well with [name] pain level of 0 throughout and [name] pain level of 0 following the procedure. Post Debridement Measurements: 1cm length x 1cm width x 0.1cm depth; 0.079cm^3 volume. Character of  Wound/Ulcer Post Debridement is improved. Severity of Tissue Post Debridement is: Fat layer exposed. Post procedure Diagnosis Wound #2: Same as Pre-Procedure Wound #4 Pre-procedure diagnosis of Wound #4 is [name] Diabetic Wound/Ulcer of the Lower Extremity located on the Right,Plantar T Great .Severity of Tissue Pre oe Debridement is: Fat layer exposed. There was [name] Excisional Skin/Subcutaneous Tissue Debridement with [name] total area of 5.52 sq cm performed by [name] Keeler, PA. With the following instrument(s): Curette to remove Viable and Non-Viable tissue/material. Material removed includes Subcutaneous Tissue and Slough and after achieving pain control using Other (benzocaine 20% spray). No specimens were taken. [name] time out was conducted at 09:20, prior to the start of the procedure. [name] Minimum amount of bleeding was controlled with Pressure. The procedure was tolerated well with [name] pain level of 0 throughout and [name] pain level of 0 following the procedure. Post Debridement Measurements: 2.3cm length x 2.4cm width x 0.1cm depth; 0.434cm^3 volume. Character of Wound/Ulcer Post Debridement is improved. Severity of Tissue Post Debridement is: Fat layer exposed. Post procedure Diagnosis Wound #4: Same as Pre-Procedure [name] Paring/cutting of benign hyperkeratotic lesion 2-4 procedure was performed. by [name] Keeler, PA. Post procedure Diagnosis Wound #: Same as  Pre-Procedure Notes: left 3rd, 4th and 5th toes using #3 curette Plan Follow-up Appointments: Return Appointment in 1 week. Bathing/ Shower/ Hygiene: May shower and wash wound with soap and water. Off-Loading: Open toe surgical shoe to: - both feet WOUND #1: - T Second Wound Laterality: Dorsal, Left oe Peri-Wound Care: Sween Lotion (Moisturizing lotion) Every Other Day/30 Days Discharge Instructions: Apply moisturizing lotion to feet with dressing changes Prim Dressing: KerraCel Ag Gelling Fiber Dressing, 2x2 in (silver alginate) Every Other Day/30 Days ary Discharge Instructions: Apply silver alginate to wound bed as instructed Secondary Dressing: Woven Gauze Sponges 2x2 in Every Other Day/30 Days Discharge Instructions: Apply over primary dressing as directed. Secured With: Child psychotherapist, Sterile 2x75 (in/in) Every Other Day/30 Days Discharge Instructions: Secure with stretch gauze as directed. WOUND #2: - T Fifth Wound Laterality: Plantar, Right oe Peri-Wound Care: Sween Lotion (Moisturizing lotion) Every Other Day/30 Days Discharge Instructions: Apply moisturizing lotion to feet with dressing changes Prim Dressing: KerraCel Ag Gelling Fiber Dressing, 2x2 in (silver alginate) Every Other Day/30 Days ary Discharge Instructions: Apply silver alginate to wound bed as instructed Secondary Dressing: Woven Gauze Sponges 2x2 in Every Other Day/30 Days Discharge Instructions: Apply over primary dressing as directed. Secured With: Child psychotherapist, Sterile 2x75 (in/in) Every Other Day/30 Days Discharge Instructions: Secure with stretch gauze as directed. WOUND #3: - T Second Wound Laterality: Plantar, Right oe Peri-Wound Care: Sween Lotion (Moisturizing lotion) Every Other Day/30 Days Discharge Instructions: Apply moisturizing lotion to feet with dressing changes Prim Dressing: KerraCel Ag Gelling Fiber Dressing, 2x2 in (silver alginate) Every Other  Day/30 Days ary Discharge Instructions: Apply silver alginate to wound bed as instructed Secondary Dressing: Woven Gauze Sponges 2x2 in Every Other Day/30 Days Discharge Instructions: Apply over primary dressing as directed. Secured With: Child psychotherapist, Sterile 2x75 (in/in) Every Other Day/30 Days Discharge Instructions: Secure with stretch gauze as directed. WOUND #4: - T Great Wound Laterality: Plantar, Right oe Peri-Wound Care: Sween Lotion (Moisturizing lotion) Every Other Day/30 Days Discharge Instructions: Apply moisturizing lotion to feet with dressing changes Prim Dressing: KerraCel Ag Gelling Fiber Dressing, 2x2 in (silver alginate) Every Other Day/30 Days ary Discharge Instructions: Apply silver alginate to wound bed as instructed Secondary Dressing: Woven Gauze Sponges  2x2 in Every Other Day/30 Days Discharge Instructions: Apply over primary dressing as directed. Secured With: Child psychotherapist, Sterile 2x75 (in/in) Every Other Day/30 Days Discharge Instructions: Secure with stretch gauze as directed. WOUND #7: - T Third Wound Laterality: Left oe Peri-Wound Care: Sween Lotion (Moisturizing lotion) Every Other Day/30 Days Discharge Instructions: Apply moisturizing lotion to feet with dressing changes Prim Dressing: KerraCel Ag Gelling Fiber Dressing, 2x2 in (silver alginate) Every Other Day/30 Days ary Discharge Instructions: Apply silver alginate to wound bed as instructed Secondary Dressing: Woven Gauze Sponges 2x2 in Every Other Day/30 Days Discharge Instructions: Apply over primary dressing as directed. Secured With: Child psychotherapist, Sterile 2x75 (in/in) Every Other Day/30 Days Discharge Instructions: Secure with stretch gauze as directed. 1. Would recommend currently that we continue with the wound care measures as before in regard to the right foot we use the silver alginate on everything which I think is doing [name] good  job for him at this time. 2. I am also can recommend that we have the patient continue to use the postop offloading shoes to try to keep pressure off of his feet and toes particularly I think that is doing [name] good job as well. 3. I am also can recommend patient continue to wash with mild soap and water I think that is perfectly fine that he can reapply the dressings following which is the alginate dressing. We will see patient back for reevaluation in 1 week here in the clinic. If anything worsens or changes patient will contact our office for additional recommendations. Electronic Signature(s) Signed: 10/03/2020 9:48:46 AM By: [name] Keeler PA-C Previous Signature: 10/03/2020 9:46:44 AM Version By: [name] Keeler PA-C Entered By: [name] on 10/03/2020 09:48:46 -------------------------------------------------------------------------------- SuperBill Details Patient Name: Date of Service: [name], [name] 10/03/2020 Medical Record Number: [id number] Patient Account Number: [id number] Date of Birth/Sex: Treating RN: May 17, 1951 (69 y.o. [name] Primary Care Provider: Grier [name] Other Clinician: Referring Provider: Treating Provider/Extender: [name] in Treatment: 1 Diagnosis Coding ICD-10 Codes Code Description [phone] Type 2 diabetes mellitus with foot ulcer L97.522 Non-pressure chronic ulcer of other part of left foot with fat layer exposed L97.512 Non-pressure chronic ulcer of other part of right foot with fat layer exposed I10 Essential (primary) hypertension N42.9 Disorder of prostate, unspecified L84 Corns and callosities Facility Procedures The patient participates with Medicare or their insurance follows the Medicare Facility Guidelines: CPT4 Code Description Modifier Quantity [id number] 11042 - DEB SUBQ TISSUE 20 SQ CM/< 1 ICD-10 Diagnosis Description L97.512 Non-pressure chronic ulcer of  other part of right foot with fat  layer exposed The patient participates with Medicare or their insurance follows the Medicare Facility Guidelines: [id number] 11056 - TRIM SKIN LESIONS 2 TO 4 59 1 ICD-10 Diagnosis Description L84 Corns and callosities Physician Procedures : CPT4 Code Description Modifier [id number] - WC PHYS SUBQ TISS 20 SQ CM ICD-10 Diagnosis Description L97.512 Non-pressure chronic ulcer of other part of right foot with fat layer exposed Quantity: 1 : [id number] [id number] - WC PHYS TRIM SKIN LESIONS 2 TO 4 59 ICD-10 Diagnosis Description L84 Corns and callosities Quantity: 1 Electronic Signature(s) Signed: 10/03/2020 9:49:24 AM By: [name] Keeler PA-C Entered By: [name] on 10/03/2020 09:49:24

## 2020-10-03 NOTE — Progress Notes (Signed)
TODDY, BOYD ([id number]) Visit Report for 10/03/2020 Arrival Information Details Patient Name: Date of Service: [name] 10/03/2020 8:30 [name] M Medical Record Number: [id number] Patient Account Number: [id number] Date of Birth/Sex: Treating RN: [birth year] (69 y.o. [name]) Carlene Coria Primary Care Schawn Byas: Grier Mitts Other Clinician: Referring Macrae Wiegman: Treating Chasmine Lender/Extender: Natalia Leatherwood Weeks in Treatment: 1 Visit Information History Since Last Visit All ordered tests and consults were completed: No Patient Arrived: Ambulatory Added or deleted any medications: No Arrival Time: 08:18 Any new allergies or adverse reactions: No Accompanied By: friend Had [name] fall or experienced change in No Transfer Assistance: None activities of daily living that may affect Patient Identification Verified: Yes risk of falls: Secondary Verification Process Completed: Yes Signs or symptoms of abuse/neglect since last visito No Patient Requires Transmission-Based Precautions: No Hospitalized since last visit: No Patient Has Alerts: No Implantable device outside of the clinic excluding No cellular tissue based products placed in the center since last visit: Has Dressing in Place as Prescribed: Yes Pain Present Now: No Electronic Signature(s) Signed: 10/03/2020 5:41:55 PM By: Carlene Coria RN Entered By: Carlene Coria on 10/03/2020 08:19:03 -------------------------------------------------------------------------------- Encounter Discharge Information Details Patient Name: Date of Service: [name], [name] 10/03/2020 8:30 [name] M Medical Record Number: [id number] Patient Account Number: [id number] Date of Birth/Sex: Treating RN: Mar 03, 1951 (69 y.o. [name]) Carlene Coria Primary Care Rodarius Kichline: Grier Mitts Other Clinician: Referring Jerrian Mells: Treating Dontravious Camille/Extender: Natalia Leatherwood Weeks in Treatment: 1 Encounter Discharge Information Items Post Procedure  Vitals Discharge Condition: Stable Temperature (F): 97.6 Ambulatory Status: Ambulatory Pulse (bpm): 73 Discharge Destination: Home Respiratory Rate (breaths/min): 18 Transportation: Private Auto Blood Pressure (mmHg): 169/76 Accompanied By: self Schedule Follow-up Appointment: Yes Clinical Summary of Care: Patient Declined Electronic Signature(s) Signed: 10/03/2020 5:41:55 PM By: Carlene Coria RN Entered By: Carlene Coria on 10/03/2020 09:56:32 -------------------------------------------------------------------------------- Lower Extremity Assessment Details Patient Name: Date of Service: [name] 10/03/2020 8:30 [name] M Medical Record Number: [id number] Patient Account Number: [id number] Date of Birth/Sex: Treating RN: [birth year] (69 y.o. [name]) Carlene Coria Primary Care Danecia Underdown: Grier Mitts Other Clinician: Referring Xiomar Crompton: Treating Naquan Garman/Extender: Natalia Leatherwood Weeks in Treatment: 1 Edema Assessment Assessed: [Left: No] [Right: No] Edema: [Left: No] [Right: No] Calf Left: Right: Point of Measurement: 37 cm From Medial Instep 33.2 cm 34 cm Ankle Left: Right: Point of Measurement: 12 cm From Medial Instep 21.3 cm 22.5 cm Electronic Signature(s) Signed: 10/03/2020 5:41:55 PM By: Carlene Coria RN Entered By: Carlene Coria on 10/03/2020 08:41:00 -------------------------------------------------------------------------------- Malin Details Patient Name: Date of Service: [name], [name] 10/03/2020 8:30 [name] M Medical Record Number: [id number] Patient Account Number: [id number] Date of Birth/Sex: Treating RN: Jul 07, 1951 (69 y.o. [name] Primary Care Cathrine [name]: Grier Mitts Other Clinician: Referring Benedetto Ryder: Treating Amontae Ng/Extender: Natalia Leatherwood Weeks in Treatment: 1 Active Inactive Nutrition Nursing Diagnoses: Impaired glucose control: actual or potential Potential for alteratiion in  Nutrition/Potential for imbalanced nutrition Goals: Patient/caregiver agrees to and verbalizes understanding of need to obtain nutritional consultation Date Initiated: 09/26/2020 Target Resolution Date: 10/26/2020 Goal Status: Active Patient/caregiver agrees to and verbalizes understanding of need to use nutritional supplements and/or vitamins as prescribed Date Initiated: 09/26/2020 Target Resolution Date: 10/25/2020 Goal Status: Active Patient/caregiver verbalizes understanding of need to maintain therapeutic glucose control per primary care physician Date Initiated: 09/26/2020 Target Resolution Date: 10/12/2020 Goal Status: Active Interventions: Assess HgA1c results as ordered upon admission and as needed Assess patient nutrition upon  admission and as needed per policy Provide education on elevated blood sugars and impact on wound healing Provide education on nutrition Treatment Activities: Education provided on Nutrition : 09/26/2020 Notes: Wound/Skin Impairment Nursing Diagnoses: Impaired tissue integrity Knowledge deficit related to ulceration/compromised skin integrity Goals: Patient will have [name] decrease in wound volume by X% from date: (specify in notes) Date Initiated: 09/26/2020 Target Resolution Date: 10/26/2020 Goal Status: Active Patient/caregiver will verbalize understanding of skin care regimen Date Initiated: 09/26/2020 Target Resolution Date: 10/26/2020 Goal Status: Active Ulcer/skin breakdown will have [name] volume reduction of 30% by week 4 Date Initiated: 09/26/2020 Target Resolution Date: 10/30/2020 Goal Status: Active Interventions: Assess patient/caregiver ability to obtain necessary supplies Assess patient/caregiver ability to perform ulcer/skin care regimen upon admission and as needed Assess ulceration(s) every visit Provide education on ulcer and skin care Notes: Electronic Signature(s) Signed: 10/03/2020 6:12:31 PM By: Baruch Gouty RN, BSN Entered By:  Baruch Gouty on 10/03/2020 09:18:13 -------------------------------------------------------------------------------- Pain Assessment Details Patient Name: Date of Service: [name], [name] 10/03/2020 8:30 [name] M Medical Record Number: [id number] Patient Account Number: [id number] Date of Birth/Sex: Treating RN: 12/10/1950 (69 y.o. Oval Linsey Primary Care Lorrin Nawrot: Grier Mitts Other Clinician: Referring Makya Phillis: Treating Manvir Prabhu/Extender: Natalia Leatherwood Weeks in Treatment: 1 Active Problems Location of Pain Severity and Description of Pain Patient Has Paino No Site Locations Pain Management and Medication Current Pain Management: Electronic Signature(s) Signed: 10/03/2020 5:41:55 PM By: Carlene Coria RN Entered By: Carlene Coria on 10/03/2020 08:40:54 -------------------------------------------------------------------------------- Patient/Caregiver Education Details Patient Name: Date of Service: [name], [name] 12/8/2021andnbsp8:30 [name] M Medical Record Number: [id number] Patient Account Number: [id number] Date of Birth/Gender: Treating RN: 1951-10-07 (69 y.o. [name] Primary Care Physician: Grier Mitts Other Clinician: Referring Physician: Treating Physician/Extender: Katherine Basset in Treatment: 1 Education Assessment Education Provided To: Patient Education Topics Provided Elevated Blood Sugar/ Impact on Healing: Methods: Explain/Verbal Responses: Reinforcements needed, State content correctly Wound/Skin Impairment: Methods: Explain/Verbal Responses: Reinforcements needed, State content correctly Electronic Signature(s) Signed: 10/03/2020 6:12:31 PM By: Baruch Gouty RN, BSN Entered By: Baruch Gouty on 10/03/2020 09:18:38 -------------------------------------------------------------------------------- Wound Assessment Details Patient Name: Date of Service: [name], [name] 10/03/2020 8:30 [name]  M Medical Record Number: [id number] Patient Account Number: [id number] Date of Birth/Sex: Treating RN: January 27, 1951 (69 y.o. [name]) Carlene Coria Primary Care Lazariah Savard: Grier Mitts Other Clinician: Referring Vickki Igou: Treating Janaysia Mcleroy/Extender: Natalia Leatherwood Weeks in Treatment: 1 Wound Status Wound Number: 1 Primary Etiology: Diabetic Wound/Ulcer of the Lower Extremity Wound Location: Left, Dorsal T Second oe Wound Status: Open Wounding Event: Blister Comorbid Hypertension, Type II Diabetes, Osteoarthritis, History: Neuropathy Date Acquired: 09/20/2020 Weeks Of Treatment: 1 Clustered Wound: No Wound Measurements Length: (cm) 0.4 Width: (cm) 0.2 Depth: (cm) 0.1 Area: (cm) 0.063 Volume: (cm) 0.006 % Reduction in Area: 96.6% % Reduction in Volume: 96.7% Epithelialization: None Tunneling: No Undermining: No Wound Description Classification: Grade 2 Wound Margin: Flat and Intact Exudate Amount: Medium Exudate Type: Serosanguineous Exudate Color: red, brown Foul Odor After Cleansing: No Slough/Fibrino No Wound Bed Granulation Amount: Large (67-100%) Exposed Structure Granulation Quality: Pink Fascia Exposed: No Necrotic Amount: None Present (0%) Fat Layer (Subcutaneous Tissue) Exposed: Yes Tendon Exposed: No Muscle Exposed: No Joint Exposed: No Bone Exposed: No Treatment Notes Wound #1 (Toe Second) Wound Laterality: Dorsal, Left Cleanser Peri-Wound Care Sween Lotion (Moisturizing lotion) Discharge Instruction: Apply moisturizing lotion to feet with dressing changes Topical Primary Dressing KerraCel Ag Gelling Fiber Dressing, 2x2 in (silver  alginate) Discharge Instruction: Apply silver alginate to wound bed as instructed Secondary Dressing Woven Gauze Sponges 2x2 in Discharge Instruction: Apply over primary dressing as directed. Secured With Conforming Stretch Gauze Bandage, Sterile 2x75 (in/in) Discharge Instruction: Secure with stretch gauze  as directed. Compression Wrap Compression Stockings Add-Ons Electronic Signature(s) Signed: 10/03/2020 5:41:55 PM By: Carlene Coria RN Entered By: Carlene Coria on 10/03/2020 08:38:31 -------------------------------------------------------------------------------- Wound Assessment Details Patient Name: Date of Service: [name], [name] 10/03/2020 8:30 [name] M Medical Record Number: [id number] Patient Account Number: [id number] Date of Birth/Sex: Treating RN: 07-21-1951 (69 y.o. [name]) Carlene Coria Primary Care Anjannette Gauger: Grier Mitts Other Clinician: Referring Mc Hollen: Treating Lemmie Vanlanen/Extender: Natalia Leatherwood Weeks in Treatment: 1 Wound Status Wound Number: 2 Primary Etiology: Diabetic Wound/Ulcer of the Lower Extremity Wound Location: Right, Plantar T Fifth oe Wound Status: Open Wounding Event: Blister Comorbid Hypertension, Type II Diabetes, Osteoarthritis, History: Neuropathy Date Acquired: 09/10/2020 Weeks Of Treatment: 1 Clustered Wound: No Wound Measurements Length: (cm) 1 Width: (cm) 1 Depth: (cm) 0.1 Area: (cm) 0.785 Volume: (cm) 0.079 % Reduction in Area: 61% % Reduction in Volume: 60.7% Epithelialization: None Tunneling: No Undermining: No Wound Description Classification: Grade 2 Wound Margin: Flat and Intact Exudate Amount: Medium Exudate Type: Serosanguineous Exudate Color: red, brown Foul Odor After Cleansing: No Slough/Fibrino Yes Wound Bed Granulation Amount: Medium (34-66%) Exposed Structure Granulation Quality: Pink Fascia Exposed: No Necrotic Amount: Medium (34-66%) Fat Layer (Subcutaneous Tissue) Exposed: Yes Necrotic Quality: Adherent Slough Tendon Exposed: No Muscle Exposed: No Joint Exposed: No Bone Exposed: No Treatment Notes Wound #2 (Toe Fifth) Wound Laterality: Plantar, Right Cleanser Peri-Wound Care Sween Lotion (Moisturizing lotion) Discharge Instruction: Apply moisturizing lotion to feet with dressing  changes Topical Primary Dressing KerraCel Ag Gelling Fiber Dressing, 2x2 in (silver alginate) Discharge Instruction: Apply silver alginate to wound bed as instructed Secondary Dressing Woven Gauze Sponges 2x2 in Discharge Instruction: Apply over primary dressing as directed. Secured With Conforming Stretch Gauze Bandage, Sterile 2x75 (in/in) Discharge Instruction: Secure with stretch gauze as directed. Compression Wrap Compression Stockings Add-Ons Electronic Signature(s) Signed: 10/03/2020 5:41:55 PM By: Carlene Coria RN Entered By: Carlene Coria on 10/03/2020 08:38:51 -------------------------------------------------------------------------------- Wound Assessment Details Patient Name: Date of Service: [name], [name] 10/03/2020 8:30 [name] M Medical Record Number: [id number] Patient Account Number: [id number] Date of Birth/Sex: Treating RN: 02/15/1951 (69 y.o. [name]) Carlene Coria Primary Care Vickey Boak: Grier Mitts Other Clinician: Referring Cherity Blickenstaff: Treating Kenishia Plack/Extender: Natalia Leatherwood Weeks in Treatment: 1 Wound Status Wound Number: 3 Primary Etiology: Diabetic Wound/Ulcer of the Lower Extremity Wound Location: Right, Plantar T Second oe Wound Status: Open Wounding Event: Blister Comorbid Hypertension, Type II Diabetes, Osteoarthritis, History: Neuropathy Date Acquired: 09/10/2020 Weeks Of Treatment: 1 Clustered Wound: No Wound Measurements Length: (cm) 0.9 Width: (cm) 1.1 Depth: (cm) 1 Area: (cm) 0.778 Volume: (cm) 0.778 % Reduction in Area: 52.6% % Reduction in Volume: -374.4% Epithelialization: None Tunneling: No Undermining: No Wound Description Classification: Grade 2 Wound Margin: Flat and Intact Exudate Amount: Medium Exudate Type: Serosanguineous Exudate Color: red, brown Foul Odor After Cleansing: No Slough/Fibrino No Wound Bed Granulation Amount: Large (67-100%) Exposed Structure Granulation Quality: Pink Fascia Exposed:  No Necrotic Amount: None Present (0%) Fat Layer (Subcutaneous Tissue) Exposed: Yes Tendon Exposed: No Muscle Exposed: No Joint Exposed: No Bone Exposed: No Treatment Notes Wound #3 (Toe Second) Wound Laterality: Plantar, Right Cleanser Peri-Wound Care Sween Lotion (Moisturizing lotion) Discharge Instruction: Apply moisturizing lotion to feet with dressing changes Topical Primary Dressing KerraCel Ag  Gelling Fiber Dressing, 2x2 in (silver alginate) Discharge Instruction: Apply silver alginate to wound bed as instructed Secondary Dressing Woven Gauze Sponges 2x2 in Discharge Instruction: Apply over primary dressing as directed. Secured With Conforming Stretch Gauze Bandage, Sterile 2x75 (in/in) Discharge Instruction: Secure with stretch gauze as directed. Compression Wrap Compression Stockings Add-Ons Electronic Signature(s) Signed: 10/03/2020 5:41:55 PM By: Carlene Coria RN Entered By: Carlene Coria on 10/03/2020 08:39:09 -------------------------------------------------------------------------------- Wound Assessment Details Patient Name: Date of Service: [name], [name] 10/03/2020 8:30 [name] M Medical Record Number: [id number] Patient Account Number: [id number] Date of Birth/Sex: Treating RN: 1951/10/22 (69 y.o. [name]) Carlene Coria Primary Care Emyah Roznowski: Grier Mitts Other Clinician: Referring Cherylyn Sundby: Treating Khilynn Borntreger/Extender: Natalia Leatherwood Weeks in Treatment: 1 Wound Status Wound Number: 4 Primary Etiology: Diabetic Wound/Ulcer of the Lower Extremity Wound Location: Right, Plantar T Great oe Wound Status: Open Wounding Event: Blister Comorbid Hypertension, Type II Diabetes, Osteoarthritis, History: Neuropathy Date Acquired: 09/10/2020 Weeks Of Treatment: 1 Clustered Wound: No Wound Measurements Length: (cm) 2.3 Width: (cm) 2.4 Depth: (cm) 0.1 Area: (cm) 4.335 Volume: (cm) 0.434 % Reduction in Area: -981% % Reduction in Volume:  -985% Epithelialization: None Tunneling: No Undermining: No Wound Description Classification: Grade 2 Wound Margin: Flat and Intact Exudate Amount: Medium Exudate Type: Serosanguineous Exudate Color: red, brown Foul Odor After Cleansing: No Slough/Fibrino No Wound Bed Granulation Amount: Large (67-100%) Exposed Structure Granulation Quality: Pink Fascia Exposed: No Necrotic Amount: None Present (0%) Fat Layer (Subcutaneous Tissue) Exposed: Yes Tendon Exposed: No Muscle Exposed: No Joint Exposed: No Bone Exposed: No Treatment Notes Wound #4 (Toe Great) Wound Laterality: Plantar, Right Cleanser Peri-Wound Care Sween Lotion (Moisturizing lotion) Discharge Instruction: Apply moisturizing lotion to feet with dressing changes Topical Primary Dressing KerraCel Ag Gelling Fiber Dressing, 2x2 in (silver alginate) Discharge Instruction: Apply silver alginate to wound bed as instructed Secondary Dressing Woven Gauze Sponges 2x2 in Discharge Instruction: Apply over primary dressing as directed. Secured With Conforming Stretch Gauze Bandage, Sterile 2x75 (in/in) Discharge Instruction: Secure with stretch gauze as directed. Compression Wrap Compression Stockings Add-Ons Electronic Signature(s) Signed: 10/03/2020 5:41:55 PM By: Carlene Coria RN Entered By: Carlene Coria on 10/03/2020 08:39:17 -------------------------------------------------------------------------------- Wound Assessment Details Patient Name: Date of Service: [name], [name] 10/03/2020 8:30 [name] M Medical Record Number: [id number] Patient Account Number: [id number] Date of Birth/Sex: Treating RN: December 31, 1950 (69 y.o. [name]) Carlene Coria Primary Care Rhandi Despain: Grier Mitts Other Clinician: Referring Andretta Ergle: Treating Aaliyan Brinkmeier/Extender: Natalia Leatherwood Weeks in Treatment: 1 Wound Status Wound Number: 5 Primary Etiology: Diabetic Wound/Ulcer of the Lower Extremity Wound Location: Left, Plantar T  Fourth oe Wound Status: Open Wounding Event: Blister Comorbid Hypertension, Type II Diabetes, Osteoarthritis, History: Neuropathy Date Acquired: 09/10/2020 Weeks Of Treatment: 1 Clustered Wound: No Wound Measurements Length: (cm) Width: (cm) Depth: (cm) Area: (cm) Volume: (cm) 0 % Reduction in Area: 100% 0 % Reduction in Volume: 100% 0 Epithelialization: Large (67-100%) 0 Tunneling: No 0 Undermining: No Wound Description Classification: Unable to visualize wound bed Wound Margin: Flat and Intact Exudate Amount: Medium Exudate Type: Serosanguineous Exudate Color: red, brown Foul Odor After Cleansing: No Slough/Fibrino No Wound Bed Granulation Amount: None Present (0%) Exposed Structure Necrotic Amount: None Present (0%) Fascia Exposed: No Fat Layer (Subcutaneous Tissue) Exposed: No Tendon Exposed: No Muscle Exposed: No Joint Exposed: No Bone Exposed: No Electronic Signature(s) Signed: 10/03/2020 5:41:55 PM By: Carlene Coria RN Entered By: Carlene Coria on 10/03/2020 08:39:33 -------------------------------------------------------------------------------- Wound Assessment Details Patient Name: Date of Service: [name],  [name] 10/03/2020 8:30 [name] M Medical Record Number: [id number] Patient Account Number: [id number] Date of Birth/Sex: Treating RN: 1950-12-17 (69 y.o. [name]) Carlene Coria Primary Care Xavier Munger: Grier Mitts Other Clinician: Referring Shritha Bresee: Treating [place] Festa/Extender: Natalia Leatherwood Weeks in Treatment: 1 Wound Status Wound Number: 6 Primary Etiology: Diabetic Wound/Ulcer of the Lower Extremity Wound Location: Right T Third oe Wound Status: Open Wounding Event: Blister Comorbid Hypertension, Type II Diabetes, Osteoarthritis, History: Neuropathy Date Acquired: 09/20/2020 Weeks Of Treatment: 1 Clustered Wound: No Wound Measurements Length: (cm) Width: (cm) Depth: (cm) Area: (cm) Volume: (cm) 0 % Reduction in Area: 100% 0 %  Reduction in Volume: 100% 0 Epithelialization: Large (67-100%) 0 Tunneling: No 0 Undermining: No Wound Description Classification: Grade 2 Wound Margin: Distinct, outline attached Exudate Amount: Medium Exudate Type: Serosanguineous Exudate Color: red, brown Foul Odor After Cleansing: No Slough/Fibrino No Wound Bed Granulation Amount: None Present (0%) Exposed Structure Necrotic Amount: None Present (0%) Fascia Exposed: No Fat Layer (Subcutaneous Tissue) Exposed: No Tendon Exposed: No Muscle Exposed: No Joint Exposed: No Bone Exposed: No Electronic Signature(s) Signed: 10/03/2020 5:41:55 PM By: Carlene Coria RN Entered By: Carlene Coria on 10/03/2020 08:39:57 -------------------------------------------------------------------------------- Wound Assessment Details Patient Name: Date of Service: [name], [name] 10/03/2020 8:30 [name] M Medical Record Number: [id number] Patient Account Number: [id number] Date of Birth/Sex: Treating RN: 03/20/1951 (69 y.o. [name]) Carlene Coria Primary Care Cherry Wittwer: Grier Mitts Other Clinician: Referring Johnie Stadel: Treating Mattalynn Crandle/Extender: Natalia Leatherwood Weeks in Treatment: 1 Wound Status Wound Number: 7 Primary Etiology: Diabetic Wound/Ulcer of the Lower Extremity Wound Location: Left T Third oe Wound Status: Open Wounding Event: Blister Comorbid Hypertension, Type II Diabetes, Osteoarthritis, History: Neuropathy Date Acquired: 09/20/2020 Weeks Of Treatment: 1 Clustered Wound: No Wound Measurements Length: (cm) 0.2 Width: (cm) 0.2 Depth: (cm) 0.1 Area: (cm) 0.031 Volume: (cm) 0.003 % Reduction in Area: 92.7% % Reduction in Volume: 92.9% Epithelialization: Medium (34-66%) Tunneling: No Undermining: No Wound Description Classification: Grade 2 Wound Margin: Distinct, outline attached Exudate Amount: Medium Exudate Type: Serosanguineous Exudate Color: red, brown Foul Odor After Cleansing: No Slough/Fibrino  No Wound Bed Granulation Amount: Large (67-100%) Exposed Structure Granulation Quality: Pink Fascia Exposed: No Necrotic Amount: None Present (0%) Fat Layer (Subcutaneous Tissue) Exposed: No Tendon Exposed: No Muscle Exposed: No Joint Exposed: No Bone Exposed: No Treatment Notes Wound #7 (Toe Third) Wound Laterality: Left Cleanser Peri-Wound Care Sween Lotion (Moisturizing lotion) Discharge Instruction: Apply moisturizing lotion to feet with dressing changes Topical Primary Dressing KerraCel Ag Gelling Fiber Dressing, 2x2 in (silver alginate) Discharge Instruction: Apply silver alginate to wound bed as instructed Secondary Dressing Woven Gauze Sponges 2x2 in Discharge Instruction: Apply over primary dressing as directed. Secured With Conforming Stretch Gauze Bandage, Sterile 2x75 (in/in) Discharge Instruction: Secure with stretch gauze as directed. Compression Wrap Compression Stockings Add-Ons Electronic Signature(s) Signed: 10/03/2020 5:41:55 PM By: Carlene Coria RN Entered By: Carlene Coria on 10/03/2020 08:40:06 -------------------------------------------------------------------------------- Vitals Details Patient Name: Date of Service: [name], [name] 10/03/2020 8:30 [name] M Medical Record Number: [id number] Patient Account Number: [id number] Date of Birth/Sex: Treating RN: 11-28-1950 (69 y.o. [name]) Carlene Coria Primary Care Ethon Wymer: Grier Mitts Other Clinician: Referring Chioke Noxon: Treating Armanii Urbanik/Extender: Natalia Leatherwood Weeks in Treatment: 1 Vital Signs Time Taken: 08:40 Temperature (F): 97.6 Height (in): 66 Pulse (bpm): 73 Weight (lbs): 159 Respiratory Rate (breaths/min): 18 Body Mass Index (BMI): 25.7 Blood Pressure (mmHg): 169/76 Reference Range: 80 - 120 mg / dl Electronic Signature(s) Signed: 10/03/2020 5:41:55  PM By: Carlene Coria RN Entered By: Carlene Coria on 10/03/2020 08:40:46

## 2020-10-04 DIAGNOSIS — N183 Chronic kidney disease, stage 3 unspecified: Secondary | ICD-10-CM | POA: Diagnosis not present

## 2020-10-04 DIAGNOSIS — C61 Malignant neoplasm of prostate: Secondary | ICD-10-CM | POA: Diagnosis not present

## 2020-10-04 DIAGNOSIS — N5201 Erectile dysfunction due to arterial insufficiency: Secondary | ICD-10-CM | POA: Diagnosis not present

## 2020-10-05 ENCOUNTER — Ambulatory Visit: Payer: Medicare HMO | Admitting: Podiatry

## 2020-10-06 ENCOUNTER — Other Ambulatory Visit: Payer: Self-pay | Admitting: Family Medicine

## 2020-10-06 DIAGNOSIS — G4700 Insomnia, unspecified: Secondary | ICD-10-CM

## 2020-10-06 DIAGNOSIS — F419 Anxiety disorder, unspecified: Secondary | ICD-10-CM

## 2020-10-08 ENCOUNTER — Other Ambulatory Visit: Payer: Medicare HMO

## 2020-10-10 ENCOUNTER — Other Ambulatory Visit: Payer: Self-pay

## 2020-10-10 ENCOUNTER — Encounter (HOSPITAL_BASED_OUTPATIENT_CLINIC_OR_DEPARTMENT_OTHER): Payer: Medicare HMO | Admitting: Physician Assistant

## 2020-10-10 DIAGNOSIS — L97512 Non-pressure chronic ulcer of other part of right foot with fat layer exposed: Secondary | ICD-10-CM | POA: Diagnosis not present

## 2020-10-10 DIAGNOSIS — E11621 Type 2 diabetes mellitus with foot ulcer: Secondary | ICD-10-CM | POA: Diagnosis not present

## 2020-10-10 NOTE — Progress Notes (Signed)
ALQUAN, MORRISH ([id number]) Visit Report for 10/10/2020 Arrival Information Details Patient Name: Date of Service: Jeanmarie Plant 10/10/2020 8:30 A M Medical Record Number: [id number] Patient Account Number: [id number] Date of Birth/Sex: Treating RN: 11-17-1950 (69 y.o. Burnadette Pop, Lauren Primary Care Juliene Kirsh: Grier Mitts Other Clinician: Referring Brodan Grewell: Treating Yacob Wilkerson/Extender: Natalia Leatherwood Weeks in Treatment: 2 Visit Information History Since Last Visit Added or deleted any medications: No Patient Arrived: Ambulatory Any new allergies or adverse reactions: No Arrival Time: 07:55 Had a fall or experienced change in No Accompanied By: self activities of daily living that may affect Transfer Assistance: None risk of falls: Patient Identification Verified: Yes Signs or symptoms of abuse/neglect since last visito No Secondary Verification Process Completed: Yes Hospitalized since last visit: No Patient Requires Transmission-Based Precautions: No Implantable device outside of the clinic excluding No Patient Has Alerts: No cellular tissue based products placed in the center since last visit: Has Dressing in Place as Prescribed: Yes Pain Present Now: No Electronic Signature(s) Signed: 10/10/2020 9:59:17 AM By: Rhae Hammock RN Entered By: Rhae Hammock on 10/10/2020 07:56:41 -------------------------------------------------------------------------------- Encounter Discharge Information Details Patient Name: Date of Service: [name], [name] 10/10/2020 8:30 A M Medical Record Number: [id number] Patient Account Number: [id number] Date of Birth/Sex: Treating RN: 1951/08/02 (69 y.o. Oval Linsey Primary Care Lawrnce Reyez: Grier Mitts Other Clinician: Referring Marquie Aderhold: Treating Britanie Harshman/Extender: Natalia Leatherwood Weeks in Treatment: 2 Encounter Discharge Information Items Post Procedure Vitals Discharge Condition:  Stable Temperature (F): 98.4 Ambulatory Status: Ambulatory Pulse (bpm): 75 Discharge Destination: Home Respiratory Rate (breaths/min): 18 Transportation: Private Auto Blood Pressure (mmHg): 148/70 Accompanied By: friend Schedule Follow-up Appointment: Yes Clinical Summary of Care: Patient Declined Electronic Signature(s) Signed: 10/10/2020 5:05:31 PM By: Carlene Coria RN Entered By: Carlene Coria on 10/10/2020 09:08:26 -------------------------------------------------------------------------------- Lower Extremity Assessment Details Patient Name: Date of Service: Jeanmarie Plant 10/10/2020 8:30 A M Medical Record Number: [id number] Patient Account Number: [id number] Date of Birth/Sex: Treating RN: [birth year] (69 y.o. Burnadette Pop, Lauren Primary Care Racquelle Hyser: Grier Mitts Other Clinician: Referring Cydney Alvarenga: Treating Eniola Cerullo/Extender: Natalia Leatherwood Weeks in Treatment: 2 Edema Assessment Assessed: [Left: No] Patrice Paradise: No] Edema: [Left: No] [Right: No] Calf Left: Right: Point of Measurement: 37 cm From Medial Instep 33 cm 33.5 cm Ankle Left: Right: Point of Measurement: 12 cm From Medial Instep 19 cm 21 cm Vascular Assessment Pulses: Dorsalis Pedis Palpable: [Left:Yes] [Right:Yes] Posterior Tibial Palpable: [Left:Yes] [Right:Yes] Electronic Signature(s) Signed: 10/10/2020 9:59:17 AM By: Rhae Hammock RN Entered By: Rhae Hammock on 10/10/2020 07:59:22 -------------------------------------------------------------------------------- Multi-Disciplinary Care Plan Details Patient Name: Date of Service: [name], [name] 10/10/2020 8:30 A M Medical Record Number: [id number] Patient Account Number: [id number] Date of Birth/Sex: Treating RN: [birth year] (69 y.o. [name] Primary Care Haniel Fix: Grier Mitts Other Clinician: Referring Lliam Hoh: Treating Vidalia Serpas/Extender: Natalia Leatherwood Weeks in Treatment: 2 Active  Inactive Nutrition Nursing Diagnoses: Impaired glucose control: actual or potential Potential for alteratiion in Nutrition/Potential for imbalanced nutrition Goals: Patient/caregiver agrees to and verbalizes understanding of need to obtain nutritional consultation Date Initiated: 09/26/2020 T arget Resolution Date: 10/26/2020 Goal Status: Active Patient/caregiver agrees to and verbalizes understanding of need to use nutritional supplements and/or vitamins as prescribed Date Initiated: 09/26/2020 T arget Resolution Date: 10/25/2020 Goal Status: Active Patient/caregiver verbalizes understanding of need to maintain therapeutic glucose control per primary care physician Date Initiated: 09/26/2020 Date Inactivated: 10/10/2020 Target Resolution Date: 10/12/2020 Goal Status: Met Interventions: Assess HgA1c results as  ordered upon admission and as needed Assess patient nutrition upon admission and as needed per policy Provide education on elevated blood sugars and impact on wound healing Provide education on nutrition Treatment Activities: Education provided on Nutrition : 09/26/2020 Notes: Wound/Skin Impairment Nursing Diagnoses: Impaired tissue integrity Knowledge deficit related to ulceration/compromised skin integrity Goals: Patient will have a decrease in wound volume by X% from date: (specify in notes) Date Initiated: 09/26/2020 Target Resolution Date: 10/26/2020 Goal Status: Active Patient/caregiver will verbalize understanding of skin care regimen Date Initiated: 09/26/2020 Target Resolution Date: 10/26/2020 Goal Status: Active Ulcer/skin breakdown will have a volume reduction of 30% by week 4 Date Initiated: 09/26/2020 Target Resolution Date: 10/30/2020 Goal Status: Active Interventions: Assess patient/caregiver ability to obtain necessary supplies Assess patient/caregiver ability to perform ulcer/skin care regimen upon admission and as needed Assess ulceration(s) every  visit Provide education on ulcer and skin care Notes: Electronic Signature(s) Signed: 10/10/2020 5:29:15 PM By: Baruch Gouty RN, BSN Entered By: Baruch Gouty on 10/10/2020 08:47:18 -------------------------------------------------------------------------------- Pain Assessment Details Patient Name: Date of Service: [name], [name] 10/10/2020 8:30 A M Medical Record Number: [id number] Patient Account Number: [id number] Date of Birth/Sex: Treating RN: 1951-10-13 (69 y.o. Erie Noe Primary Care Maria Gallicchio: Grier Mitts Other Clinician: Referring Naftoli Penny: Treating Jakeem Grape/Extender: Natalia Leatherwood Weeks in Treatment: 2 Active Problems Location of Pain Severity and Description of Pain Patient Has Paino No Site Locations Rate the pain. Rate the pain. Current Pain Level: 0 Pain Management and Medication Current Pain Management: Electronic Signature(s) Signed: 10/10/2020 9:59:17 AM By: Rhae Hammock RN Entered By: Rhae Hammock on 10/10/2020 07:57:10 -------------------------------------------------------------------------------- Patient/Caregiver Education Details Patient Name: Date of Service: [name], [name] 12/15/2021andnbsp8:30 [place] Record Number: [id number] Patient Account Number: [id number] Date of Birth/Gender: Treating RN: 1951/09/24 (69 y.o. [name] Primary Care Physician: Grier Mitts Other Clinician: Referring Physician: Treating Physician/Extender: Katherine Basset in Treatment: 2 Education Assessment Education Provided To: Patient Education Topics Provided Elevated Blood Sugar/ Impact on Healing: Methods: Explain/Verbal Responses: Reinforcements needed, State content correctly Wound/Skin Impairment: Methods: Explain/Verbal Responses: Reinforcements needed, State content correctly Electronic Signature(s) Signed: 10/10/2020 5:29:15 PM By: Baruch Gouty RN, BSN Entered  By: Baruch Gouty on 10/10/2020 08:50:05 -------------------------------------------------------------------------------- Wound Assessment Details Patient Name: Date of Service: [name], [name] 10/10/2020 8:30 A M Medical Record Number: [id number] Patient Account Number: [id number] Date of Birth/Sex: Treating RN: [birth year] (69 y.o. Burnadette Pop, Lauren Primary Care Clearance Chenault: Grier Mitts Other Clinician: Referring Sharmon Cheramie: Treating Akhil Piscopo/Extender: Natalia Leatherwood Weeks in Treatment: 2 Wound Status Wound Number: 1 Primary Etiology: Diabetic Wound/Ulcer of the Lower Extremity Wound Location: Left, Dorsal T Second oe Wound Status: Open Wounding Event: Blister Date Acquired: 09/20/2020 Weeks Of Treatment: 2 Clustered Wound: No Wound Measurements Length: (cm) 0 Width: (cm) 0 Depth: (cm) 0 Area: (cm) 0 Volume: (cm) 0 % Reduction in Area: 100% % Reduction in Volume: 100% Wound Description Classification: Grade 2 Electronic Signature(s) Signed: 10/10/2020 9:59:17 AM By: Rhae Hammock RN Entered By: Rhae Hammock on 10/10/2020 08:08:44 -------------------------------------------------------------------------------- Wound Assessment Details Patient Name: Date of Service: [name], [name] 10/10/2020 8:30 A M Medical Record Number: [id number] Patient Account Number: [id number] Date of Birth/Sex: Treating RN: Sep 30, 1951 (69 y.o. [name] Primary Care Shekira Drummer: Grier Mitts Other Clinician: Referring Chatham Howington: Treating Lessie Funderburke/Extender: Natalia Leatherwood Weeks in Treatment: 2 Wound Status Wound Number: 2 Primary Etiology: Diabetic Wound/Ulcer of the Lower Extremity Wound Location: Right, Plantar T  Fifth oe Wound Status: Open Wounding Event: Blister Comorbid Hypertension, Type II Diabetes, Osteoarthritis, History: Neuropathy Date Acquired: 09/10/2020 Weeks Of Treatment: 2 Clustered Wound: No Wound  Measurements Length: (cm) 0.9 Width: (cm) 1 Depth: (cm) 0.1 Area: (cm) 0.707 Volume: (cm) 0.071 % Reduction in Area: 64.8% % Reduction in Volume: 64.7% Epithelialization: Medium (34-66%) Tunneling: No Undermining: No Wound Description Classification: Grade 2 Wound Margin: Flat and Intact Exudate Amount: Medium Exudate Type: Serosanguineous Exudate Color: red, brown Foul Odor After Cleansing: No Slough/Fibrino Yes Wound Bed Granulation Amount: Medium (34-66%) Exposed Structure Granulation Quality: Pink Fascia Exposed: No Necrotic Amount: Medium (34-66%) Fat Layer (Subcutaneous Tissue) Exposed: Yes Necrotic Quality: Adherent Slough Tendon Exposed: No Muscle Exposed: No Joint Exposed: No Bone Exposed: No Treatment Notes Wound #2 (Toe Fifth) Wound Laterality: Plantar, Right Cleanser Peri-Wound Care Sween Lotion (Moisturizing lotion) Discharge Instruction: Apply moisturizing lotion to feet with dressing changes Topical Primary Dressing KerraCel Ag Gelling Fiber Dressing, 2x2 in (silver alginate) Discharge Instruction: Apply silver alginate to wound bed as instructed Secondary Dressing Woven Gauze Sponges 2x2 in Discharge Instruction: Apply over primary dressing as directed. Secured With Conforming Stretch Gauze Bandage, Sterile 2x75 (in/in) Discharge Instruction: Secure with stretch gauze as directed. Compression Wrap Compression Stockings Add-Ons Electronic Signature(s) Signed: 10/10/2020 5:29:15 PM By: Baruch Gouty RN, BSN Previous Signature: 10/10/2020 8:10:51 AM Version By: Rhae Hammock RN Entered By: Baruch Gouty on 10/10/2020 08:49:23 -------------------------------------------------------------------------------- Wound Assessment Details Patient Name: Date of Service: [name], [name] 10/10/2020 8:30 A M Medical Record Number: [id number] Patient Account Number: [id number] Date of Birth/Sex: Treating RN: 10-Apr-1951 (69 y.o. Burnadette Pop,  Lauren Primary Care Ralphine Hinks: Grier Mitts Other Clinician: Referring Tarisa Paola: Treating Mellany Dinsmore/Extender: Natalia Leatherwood Weeks in Treatment: 2 Wound Status Wound Number: 3 Primary Etiology: Diabetic Wound/Ulcer of the Lower Extremity Wound Location: Right, Plantar T Second oe Wound Status: Open Wounding Event: Blister Comorbid Hypertension, Type II Diabetes, Osteoarthritis, History: Neuropathy Date Acquired: 09/10/2020 Weeks Of Treatment: 2 Clustered Wound: No Wound Measurements Length: (cm) 0.2 Width: (cm) 0.2 Depth: (cm) 0.1 Area: (cm) 0.031 Volume: (cm) 0.003 % Reduction in Area: 98.1% % Reduction in Volume: 98.2% Epithelialization: Large (67-100%) Tunneling: No Undermining: No Wound Description Classification: Grade 2 Wound Margin: Flat and Intact Exudate Amount: Medium Exudate Type: Serosanguineous Exudate Color: red, brown Foul Odor After Cleansing: No Slough/Fibrino No Wound Bed Granulation Amount: Large (67-100%) Exposed Structure Granulation Quality: Pink Fascia Exposed: No Necrotic Amount: None Present (0%) Fat Layer (Subcutaneous Tissue) Exposed: Yes Tendon Exposed: No Muscle Exposed: No Joint Exposed: No Bone Exposed: No Treatment Notes Wound #3 (Toe Second) Wound Laterality: Plantar, Right Cleanser Peri-Wound Care Sween Lotion (Moisturizing lotion) Discharge Instruction: Apply moisturizing lotion to feet with dressing changes Topical Primary Dressing KerraCel Ag Gelling Fiber Dressing, 2x2 in (silver alginate) Discharge Instruction: Apply silver alginate to wound bed as instructed Secondary Dressing Woven Gauze Sponges 2x2 in Discharge Instruction: Apply over primary dressing as directed. Secured With Conforming Stretch Gauze Bandage, Sterile 2x75 (in/in) Discharge Instruction: Secure with stretch gauze as directed. Compression Wrap Compression Stockings Add-Ons Electronic Signature(s) Signed: 10/10/2020 8:09:25 AM  By: Rhae Hammock RN Entered By: Rhae Hammock on 10/10/2020 08:09:25 -------------------------------------------------------------------------------- Wound Assessment Details Patient Name: Date of Service: [name], [name] 10/10/2020 8:30 A M Medical Record Number: [id number] Patient Account Number: [id number] Date of Birth/Sex: Treating RN: Jul 04, 1951 (69 y.o. Burnadette Pop, Lauren Primary Care Serine Kea: Grier Mitts Other Clinician: Referring Briyan Kleven: Treating Trusten Hume/Extender: Natalia Leatherwood Weeks in Treatment:  2 Wound Status Wound Number: 4 Primary Etiology: Diabetic Wound/Ulcer of the Lower Extremity Wound Location: Right, Plantar T Great oe Wound Status: Open Wounding Event: Blister Comorbid Hypertension, Type II Diabetes, Osteoarthritis, History: Neuropathy Date Acquired: 09/10/2020 Weeks Of Treatment: 2 Clustered Wound: No Wound Measurements Length: (cm) 2.2 Width: (cm) 1.8 Depth: (cm) 0.1 Area: (cm) 3.11 Volume: (cm) 0.311 % Reduction in Area: -675.6% % Reduction in Volume: -677.5% Epithelialization: Small (1-33%) Tunneling: No Undermining: No Wound Description Classification: Grade 2 Wound Margin: Flat and Intact Exudate Amount: Medium Exudate Type: Serosanguineous Exudate Color: red, brown Foul Odor After Cleansing: No Slough/Fibrino No Wound Bed Granulation Amount: Large (67-100%) Exposed Structure Granulation Quality: Pink Fascia Exposed: No Necrotic Amount: None Present (0%) Fat Layer (Subcutaneous Tissue) Exposed: Yes Tendon Exposed: No Muscle Exposed: No Joint Exposed: No Bone Exposed: No Treatment Notes Wound #4 (Toe Great) Wound Laterality: Plantar, Right Cleanser Peri-Wound Care Sween Lotion (Moisturizing lotion) Discharge Instruction: Apply moisturizing lotion to feet with dressing changes Topical Primary Dressing KerraCel Ag Gelling Fiber Dressing, 2x2 in (silver alginate) Discharge Instruction: Apply  silver alginate to wound bed as instructed Secondary Dressing Woven Gauze Sponges 2x2 in Discharge Instruction: Apply over primary dressing as directed. Secured With Conforming Stretch Gauze Bandage, Sterile 2x75 (in/in) Discharge Instruction: Secure with stretch gauze as directed. Compression Wrap Compression Stockings Add-Ons Electronic Signature(s) Signed: 10/10/2020 8:10:16 AM By: Rhae Hammock RN Entered By: Rhae Hammock on 10/10/2020 08:10:16 -------------------------------------------------------------------------------- Wound Assessment Details Patient Name: Date of Service: [name], [name] 10/10/2020 8:30 A M Medical Record Number: [id number] Patient Account Number: [id number] Date of Birth/Sex: Treating RN: [birth year] (69 y.o. Burnadette Pop, Lauren Primary Care Phung Kotas: Grier Mitts Other Clinician: Referring Tanija Germani: Treating Teagen Bucio/Extender: Natalia Leatherwood Weeks in Treatment: 2 Wound Status Wound Number: 7 Primary Etiology: Diabetic Wound/Ulcer of the Lower Extremity Wound Location: Left T Third oe Wound Status: Open Wounding Event: Blister Date Acquired: 09/20/2020 Weeks Of Treatment: 2 Clustered Wound: No Wound Measurements Length: (cm) Width: (cm) Depth: (cm) Area: (cm) Volume: (cm) 0 % Reduction in Area: 100% 0 % Reduction in Volume: 100% 0 0 0 Wound Description Classification: Grade 2 Electronic Signature(s) Signed: 10/10/2020 9:59:17 AM By: Rhae Hammock RN Entered By: Rhae Hammock on 10/10/2020 08:08:44 -------------------------------------------------------------------------------- Vitals Details Patient Name: Date of Service: [name], [name] 10/10/2020 8:30 A M Medical Record Number: [id number] Patient Account Number: [id number] Date of Birth/Sex: Treating RN: Jun 07, 1951 (69 y.o. Burnadette Pop, Lauren Primary Care Naziya Hegwood: Grier Mitts Other Clinician: Referring Meshilem Machuca: Treating  Natalia Wittmeyer/Extender: Natalia Leatherwood Weeks in Treatment: 2 Vital Signs Time Taken: 07:56 Temperature (F): 98.4 Height (in): 66 Pulse (bpm): 76 Weight (lbs): 159 Respiratory Rate (breaths/min): 18 Body Mass Index (BMI): 25.7 Blood Pressure (mmHg): 148/70 Capillary Blood Glucose (mg/dl): 180 Reference Range: 80 - 120 mg / dl Electronic Signature(s) Signed: 10/10/2020 9:59:17 AM By: Rhae Hammock RN Entered By: Rhae Hammock on 10/10/2020 07:57:04

## 2020-10-10 NOTE — Progress Notes (Addendum)
ORAL, REMACHE ([id number]) Visit Report for 10/10/2020 Chief Complaint Document Details Patient Name: Date of Service: [name] 10/10/2020 8:30 A M Medical Record Number: [id number] Patient Account Number: [id number] Date of Birth/Sex: Treating RN: February 23, 1951 (69 y.o. [name] Primary Care Provider: Grier Mitts Other Clinician: Referring Provider: Treating Provider/Extender: Natalia Leatherwood Weeks in Treatment: 2 Information Obtained from: Patient Chief Complaint Bilateral T Ulcers oe Electronic Signature(s) Signed: 10/10/2020 8:28:04 AM By: Worthy Keeler PA-C Entered By: Worthy Keeler on 10/10/2020 78:29:56 -------------------------------------------------------------------------------- Debridement Details Patient Name: Date of Service: [name] 10/10/2020 8:30 A M Medical Record Number: [id number] Patient Account Number: [id number] Date of Birth/Sex: Treating RN: 1951/03/20 (69 y.o. [name] Primary Care Provider: Grier Mitts Other Clinician: Referring Provider: Treating Provider/Extender: Natalia Leatherwood Weeks in Treatment: 2 Debridement Performed for Assessment: Wound #3 Right,Plantar T Second oe Performed By: Physician Worthy Keeler, PA Debridement Type: Debridement Severity of Tissue Pre Debridement: Fat layer exposed Level of Consciousness (Pre-procedure): Awake and Alert Pre-procedure Verification/Time Out Yes - 08:45 Taken: Start Time: 08:48 Pain Control: Other : benzocaine 20% spray T Area Debrided (L x W): otal 0.5 (cm) x 0.8 (cm) = 0.4 (cm) Tissue and other material debrided: Viable, Non-Viable, Callus, Subcutaneous, Skin: Epidermis Level: Skin/Subcutaneous Tissue Debridement Description: Excisional Instrument: Curette Bleeding: Minimum Hemostasis Achieved: Pressure Procedural Pain: 0 Post Procedural Pain: 0 Response to Treatment: Procedure was tolerated well Level of  Consciousness (Post- Awake and Alert procedure): Post Debridement Measurements of Total Wound Length: (cm) 0.5 Width: (cm) 0.9 Depth: (cm) 0.1 Volume: (cm) 0.035 Character of Wound/Ulcer Post Debridement: Improved Severity of Tissue Post Debridement: Fat layer exposed Post Procedure Diagnosis Same as Pre-procedure Electronic Signature(s) Signed: 10/10/2020 1:05:14 PM By: Worthy Keeler PA-C Signed: 10/10/2020 5:29:15 PM By: Baruch Gouty RN, BSN Entered By: Baruch Gouty on 10/10/2020 08:52:30 -------------------------------------------------------------------------------- Debridement Details Patient Name: Date of Service: [name] 10/10/2020 8:30 A M Medical Record Number: [id number] Patient Account Number: [id number] Date of Birth/Sex: Treating RN: 1951-08-23 (69 y.o. [name] Primary Care Provider: Grier Mitts Other Clinician: Referring Provider: Treating Provider/Extender: Natalia Leatherwood Weeks in Treatment: 2 Debridement Performed for Assessment: Wound #4 Right,Plantar T Great oe Performed By: Physician Worthy Keeler, PA Debridement Type: Debridement Severity of Tissue Pre Debridement: Fat layer exposed Level of Consciousness (Pre-procedure): Awake and Alert Pre-procedure Verification/Time Out Yes - 08:45 Taken: Start Time: 08:48 Pain Control: Other : benzocaine 20% spray T Area Debrided (L x W): otal 2.2 (cm) x 1.8 (cm) = 3.96 (cm) Tissue and other material debrided: Viable, Non-Viable, Callus, Slough, Subcutaneous, Skin: Epidermis, Slough Level: Skin/Subcutaneous Tissue Debridement Description: Excisional Instrument: Curette Bleeding: Minimum Hemostasis Achieved: Pressure End Time: 08:55 Procedural Pain: 0 Post Procedural Pain: 0 Response to Treatment: Procedure was tolerated well Level of Consciousness (Post- Awake and Alert procedure): Post Debridement Measurements of Total Wound Length: (cm) 2.2 Width:  (cm) 1.8 Depth: (cm) 0.1 Volume: (cm) 0.311 Character of Wound/Ulcer Post Debridement: Improved Severity of Tissue Post Debridement: Fat layer exposed Post Procedure Diagnosis Same as Pre-procedure Electronic Signature(s) Signed: 10/10/2020 1:05:14 PM By: Worthy Keeler PA-C Signed: 10/10/2020 5:29:15 PM By: Baruch Gouty RN, BSN Entered By: Baruch Gouty on 10/10/2020 08:53:11 -------------------------------------------------------------------------------- Debridement Details Patient Name: Date of Service: [name] 10/10/2020 8:30 A M Medical Record Number: [id number] Patient Account Number: [id number] Date of Birth/Sex: Treating RN: [birth year] (69 y.o. [name]  Primary Care Provider: Grier Mitts Other Clinician: Referring Provider: Treating Provider/Extender: Natalia Leatherwood Weeks in Treatment: 2 Debridement Performed for Assessment: Wound #2 Right,Plantar T Fifth oe Performed By: Physician Worthy Keeler, PA Debridement Type: Debridement Severity of Tissue Pre Debridement: Fat layer exposed Level of Consciousness (Pre-procedure): Awake and Alert Pre-procedure Verification/Time Out Yes - 08:45 Taken: Start Time: 08:48 Pain Control: Other : benzocaine 20% spray T Area Debrided (L x W): otal 0.9 (cm) x 1 (cm) = 0.9 (cm) Tissue and other material debrided: Viable, Non-Viable, Callus, Subcutaneous, Skin: Epidermis Level: Skin/Subcutaneous Tissue Debridement Description: Excisional Instrument: Curette Bleeding: Minimum Hemostasis Achieved: Pressure End Time: 08:55 Procedural Pain: 0 Post Procedural Pain: 0 Response to Treatment: Procedure was tolerated well Level of Consciousness (Post- Awake and Alert procedure): Post Debridement Measurements of Total Wound Length: (cm) 0.9 Width: (cm) 1 Depth: (cm) 0.1 Volume: (cm) 0.071 Character of Wound/Ulcer Post Debridement: Improved Severity of Tissue Post Debridement: Fat layer  exposed Post Procedure Diagnosis Same as Pre-procedure Electronic Signature(s) Signed: 10/10/2020 1:05:14 PM By: Worthy Keeler PA-C Signed: 10/10/2020 5:29:15 PM By: Baruch Gouty RN, BSN Entered By: Baruch Gouty on 10/10/2020 08:53:48 -------------------------------------------------------------------------------- HPI Details Patient Name: Date of Service: [name] 10/10/2020 8:30 A M Medical Record Number: [id number] Patient Account Number: [id number] Date of Birth/Sex: Treating RN: 1950-12-25 (69 y.o. [name] Primary Care Provider: Grier Mitts Other Clinician: Referring Provider: Treating Provider/Extender: Natalia Leatherwood Weeks in Treatment: 2 History of Present Illness HPI Description: 09/26/2020 on evaluation today patient appears to be doing somewhat poorly in regard to his bilateral feet at this point due to issues that he is been having that developed about 2 weeks ago. He was walking home which was about a mile and states that he first noted the areas on the right foot beginning. These were blisters that develop. The left started Thanksgiving day when he was up standing more cooking. He did see his primary care provider who referred him to podiatry. He saw Dr. Posey Pronto and Dr. Posey Pronto recommended Betadine moistened gauze dressings to the wounds and to follow-up in 2 weeks. With that being said the patient subsequently was concerned about infection 2 to 3 days later as was his family member who is with him today. I believe this was a sister. With that being said subsequently he ended up going to urgent care at that point he was given Diflucan and Keflex he is done with both at this time. He is could be canceling the appointment with Dr. Posey Pronto they tell me at this point. His most recent hemoglobin A1c was 6.6 on November 12. His ABIs were 1.2 on the right and 1.03 on the left and appear to be doing excellent. With that being said he does not  have diabetic shoes and I feel like the shoes were likely the culprit for what led to this issue currently. There does not appear to be any signs of systemic infection nor local infection at this point which is great news. The patient does have a history of hypertension as well as potentially a recurrence of prostate cancer he is being followed by the cancer center for that at this point 10/03/2020 upon evaluation today patient appears to be doing better in regard to his toe ulcers. He has been tolerating the dressing changes without complication. Fortunately there is no signs of active infection at this time. The right great toe and fifth toe are the 2 worst  out of everything here he has a couple that have healed on the left foot there is really nothing remaining open though he has several areas of callus on the left foot in fact 3 on the third fourth and fifth toes. That is going need to be removed today as well. 10/10/2020 upon evaluation today patient appears to be doing well in regard to his wounds. He still has wounds open on the first, second, and fifth toes of the right foot. Left foot is completely healed and the right fourth toe is also completely healed today. Overall very pleased with how things seem to be progressing. Electronic Signature(s) Signed: 10/10/2020 8:57:14 AM By: Worthy Keeler PA-C Entered By: Worthy Keeler on 10/10/2020 08:57:14 -------------------------------------------------------------------------------- Physical Exam Details Patient Name: Date of Service: [name] 10/10/2020 8:30 A M Medical Record Number: [id number] Patient Account Number: [id number] Date of Birth/Sex: Treating RN: December 01, 1950 (69 y.o. [name] Primary Care Provider: Grier Mitts Other Clinician: Referring Provider: Treating Provider/Extender: Natalia Leatherwood Weeks in Treatment: 2 Constitutional Well-nourished and well-hydrated in no acute  distress. Respiratory normal breathing without difficulty. Psychiatric this patient is able to make decisions and demonstrates good insight into disease process. Alert and Oriented x 3. pleasant and cooperative. Notes Patient's wounds currently appear to be doing well. I did actually debride the first, second, and fifth toes today to clear away some of the necrotic debris he tolerated that well without complication post debridement the wound bed appears to be doing much better. Electronic Signature(s) Signed: 10/10/2020 8:58:32 AM By: Worthy Keeler PA-C Entered By: Worthy Keeler on 10/10/2020 08:58:32 -------------------------------------------------------------------------------- Physician Orders Details Patient Name: Date of Service: [name] 10/10/2020 8:30 A M Medical Record Number: [id number] Patient Account Number: [id number] Date of Birth/Sex: Treating RN: [birth year] (69 y.o. [name] Primary Care Provider: Grier Mitts Other Clinician: Referring Provider: Treating Provider/Extender: Natalia Leatherwood Weeks in Treatment: 2 Verbal / Phone Orders: No Diagnosis Coding ICD-10 Coding Code Description E11.621 Type 2 diabetes mellitus with foot ulcer L97.522 Non-pressure chronic ulcer of other part of left foot with fat layer exposed L97.512 Non-pressure chronic ulcer of other part of right foot with fat layer exposed I10 Essential (primary) hypertension N42.9 Disorder of prostate, unspecified L84 Corns and callosities Follow-up Appointments Return Appointment in 1 week. Bathing/ Shower/ Hygiene May shower and wash wound with soap and water. Off-Loading Open toe surgical shoe to: - both feet Wound Treatment Wound #2 - T Fifth oe Wound Laterality: Plantar, Right Peri-Wound Care: Sween Lotion (Moisturizing lotion) Every Other Day/30 Days Discharge Instructions: Apply moisturizing lotion to feet with dressing changes Prim Dressing:  KerraCel Ag Gelling Fiber Dressing, 2x2 in (silver alginate) Every Other Day/30 Days ary Discharge Instructions: Apply silver alginate to wound bed as instructed Secondary Dressing: Woven Gauze Sponges 2x2 in Every Other Day/30 Days Discharge Instructions: Apply over primary dressing as directed. Secured With: Child psychotherapist, Sterile 2x75 (in/in) Every Other Day/30 Days Discharge Instructions: Secure with stretch gauze as directed. Wound #3 - T Second oe Wound Laterality: Plantar, Right Peri-Wound Care: Sween Lotion (Moisturizing lotion) Every Other Day/30 Days Discharge Instructions: Apply moisturizing lotion to feet with dressing changes Prim Dressing: KerraCel Ag Gelling Fiber Dressing, 2x2 in (silver alginate) Every Other Day/30 Days ary Discharge Instructions: Apply silver alginate to wound bed as instructed Secondary Dressing: Woven Gauze Sponges 2x2 in Every Other Day/30 Days Discharge Instructions: Apply over primary dressing  as directed. Secured With: Child psychotherapist, Sterile 2x75 (in/in) Every Other Day/30 Days Discharge Instructions: Secure with stretch gauze as directed. Wound #4 - T Great oe Wound Laterality: Plantar, Right Peri-Wound Care: Sween Lotion (Moisturizing lotion) Every Other Day/30 Days Discharge Instructions: Apply moisturizing lotion to feet with dressing changes Prim Dressing: KerraCel Ag Gelling Fiber Dressing, 2x2 in (silver alginate) Every Other Day/30 Days ary Discharge Instructions: Apply silver alginate to wound bed as instructed Secondary Dressing: Woven Gauze Sponges 2x2 in Every Other Day/30 Days Discharge Instructions: Apply over primary dressing as directed. Secured With: Child psychotherapist, Sterile 2x75 (in/in) Every Other Day/30 Days Discharge Instructions: Secure with stretch gauze as directed. Patient Medications llergies: No Known Drug Allergies A Notifications Medication Indication Start  End prior to debridement 10/10/2020 benzocaine DOSE topical 20 % aerosol - aerosol topical Electronic Signature(s) Signed: 10/10/2020 1:05:14 PM By: Worthy Keeler PA-C Signed: 10/10/2020 5:29:15 PM By: Baruch Gouty RN, BSN Entered By: Baruch Gouty on 10/10/2020 08:56:40 -------------------------------------------------------------------------------- Problem List Details Patient Name: Date of Service: [name] 10/10/2020 8:30 A M Medical Record Number: [id number] Patient Account Number: [id number] Date of Birth/Sex: Treating RN: 1951-05-07 (69 y.o. [name] Primary Care Provider: Grier Mitts Other Clinician: Referring Provider: Treating Provider/Extender: Natalia Leatherwood Weeks in Treatment: 2 Active Problems ICD-10 Encounter Code Description Active Date MDM Diagnosis E11.621 Type 2 diabetes mellitus with foot ulcer 09/26/2020 No Yes L97.522 Non-pressure chronic ulcer of other part of left foot with fat layer exposed 09/26/2020 No Yes L97.512 Non-pressure chronic ulcer of other part of right foot with fat layer exposed 09/26/2020 No Yes I10 Essential (primary) hypertension 09/26/2020 No Yes N42.9 Disorder of prostate, unspecified 09/26/2020 No Yes L84 Corns and callosities 10/03/2020 No Yes Inactive Problems Resolved Problems Electronic Signature(s) Signed: 10/10/2020 8:27:55 AM By: Worthy Keeler PA-C Entered By: Worthy Keeler on 10/10/2020 08:27:55 -------------------------------------------------------------------------------- Progress Note Details Patient Name: Date of Service: [name] 10/10/2020 8:30 A M Medical Record Number: [id number] Patient Account Number: [id number] Date of Birth/Sex: Treating RN: 06-18-1951 (69 y.o. [name] Primary Care Provider: Grier Mitts Other Clinician: Referring Provider: Treating Provider/Extender: Natalia Leatherwood Weeks in Treatment: 2 Subjective Chief  Complaint Information obtained from Patient Bilateral T Ulcers oe History of Present Illness (HPI) 09/26/2020 on evaluation today patient appears to be doing somewhat poorly in regard to his bilateral feet at this point due to issues that he is been having that developed about 2 weeks ago. He was walking home which was about a mile and states that he first noted the areas on the right foot beginning. These were blisters that develop. The left started Thanksgiving day when he was up standing more cooking. He did see his primary care provider who referred him to podiatry. He saw Dr. Posey Pronto and Dr. Posey Pronto recommended Betadine moistened gauze dressings to the wounds and to follow-up in 2 weeks. With that being said the patient subsequently was concerned about infection 2 to 3 days later as was his family member who is with him today. I believe this was a sister. With that being said subsequently he ended up going to urgent care at that point he was given Diflucan and Keflex he is done with both at this time. He is could be canceling the appointment with Dr. Posey Pronto they tell me at this point. His most recent hemoglobin A1c was 6.6 on November 12. His ABIs were 1.2 on  the right and 1.03 on the left and appear to be doing excellent. With that being said he does not have diabetic shoes and I feel like the shoes were likely the culprit for what led to this issue currently. There does not appear to be any signs of systemic infection nor local infection at this point which is great news. The patient does have a history of hypertension as well as potentially a recurrence of prostate cancer he is being followed by the cancer center for that at this point 10/03/2020 upon evaluation today patient appears to be doing better in regard to his toe ulcers. He has been tolerating the dressing changes without complication. Fortunately there is no signs of active infection at this time. The right great toe and fifth toe are the  2 worst out of everything here he has a couple that have healed on the left foot there is really nothing remaining open though he has several areas of callus on the left foot in fact 3 on the third fourth and fifth toes. That is going need to be removed today as well. 10/10/2020 upon evaluation today patient appears to be doing well in regard to his wounds. He still has wounds open on the first, second, and fifth toes of the right foot. Left foot is completely healed and the right fourth toe is also completely healed today. Overall very pleased with how things seem to be progressing. Objective Constitutional Well-nourished and well-hydrated in no acute distress. Vitals Time Taken: 7:56 AM, Height: 66 in, Weight: 159 lbs, BMI: 25.7, Temperature: 98.4 F, Pulse: 76 bpm, Respiratory Rate: 18 breaths/min, Blood Pressure: 148/70 mmHg, Capillary Blood Glucose: 180 mg/dl. Respiratory normal breathing without difficulty. Psychiatric this patient is able to make decisions and demonstrates good insight into disease process. Alert and Oriented x 3. pleasant and cooperative. General Notes: Patient's wounds currently appear to be doing well. I did actually debride the first, second, and fifth toes today to clear away some of the necrotic debris he tolerated that well without complication post debridement the wound bed appears to be doing much better. Integumentary (Hair, Skin) Wound #1 status is Open. Original cause of wound was Blister. The wound is located on the Left,Dorsal T Second. The wound measures 0cm length x 0cm oe width x 0cm depth; 0cm^2 area and 0cm^3 volume. Wound #2 status is Open. Original cause of wound was Blister. The wound is located on the Right,Plantar T Fifth. The wound measures 0.9cm length x 1cm oe width x 0.1cm depth; 0.707cm^2 area and 0.071cm^3 volume. There is Fat Layer (Subcutaneous Tissue) exposed. There is no tunneling or undermining noted. There is a medium amount of  serosanguineous drainage noted. The wound margin is flat and intact. There is medium (34-66%) pink granulation within the wound bed. There is a medium (34-66%) amount of necrotic tissue within the wound bed including Adherent Slough. Wound #3 status is Open. Original cause of wound was Blister. The wound is located on the Right,Plantar T Second. The wound measures 0.2cm length x oe 0.2cm width x 0.1cm depth; 0.031cm^2 area and 0.003cm^3 volume. There is Fat Layer (Subcutaneous Tissue) exposed. There is no tunneling or undermining noted. There is a medium amount of serosanguineous drainage noted. The wound margin is flat and intact. There is large (67-100%) pink granulation within the wound bed. There is no necrotic tissue within the wound bed. Wound #4 status is Open. Original cause of wound was Blister. The wound is located on the Right,Plantar  T Great. The wound measures 2.2cm length x oe 1.8cm width x 0.1cm depth; 3.11cm^2 area and 0.311cm^3 volume. There is Fat Layer (Subcutaneous Tissue) exposed. There is no tunneling or undermining noted. There is a medium amount of serosanguineous drainage noted. The wound margin is flat and intact. There is large (67-100%) pink granulation within the wound bed. There is no necrotic tissue within the wound bed. Wound #7 status is Open. Original cause of wound was Blister. The wound is located on the Left T Third. The wound measures 0cm length x 0cm width x 0cm oe depth; 0cm^2 area and 0cm^3 volume. Assessment Active Problems ICD-10 Type 2 diabetes mellitus with foot ulcer Non-pressure chronic ulcer of other part of left foot with fat layer exposed Non-pressure chronic ulcer of other part of right foot with fat layer exposed Essential (primary) hypertension Disorder of prostate, unspecified Corns and callosities Procedures Wound #2 Pre-procedure diagnosis of Wound #2 is a Diabetic Wound/Ulcer of the Lower Extremity located on the Right,Plantar T Fifth  .Severity of Tissue Pre oe Debridement is: Fat layer exposed. There was a Excisional Skin/Subcutaneous Tissue Debridement with a total area of 0.9 sq cm performed by Worthy Keeler, PA. With the following instrument(s): Curette to remove Viable and Non-Viable tissue/material. Material removed includes Callus, Subcutaneous Tissue, and Skin: Epidermis after achieving pain control using Other (benzocaine 20% spray). No specimens were taken. A time out was conducted at 08:45, prior to the start of the procedure. A Minimum amount of bleeding was controlled with Pressure. The procedure was tolerated well with a pain level of 0 throughout and a pain level of 0 following the procedure. Post Debridement Measurements: 0.9cm length x 1cm width x 0.1cm depth; 0.071cm^3 volume. Character of Wound/Ulcer Post Debridement is improved. Severity of Tissue Post Debridement is: Fat layer exposed. Post procedure Diagnosis Wound #2: Same as Pre-Procedure Wound #3 Pre-procedure diagnosis of Wound #3 is a Diabetic Wound/Ulcer of the Lower Extremity located on the Right,Plantar T Second .Severity of Tissue Pre oe Debridement is: Fat layer exposed. There was a Excisional Skin/Subcutaneous Tissue Debridement with a total area of 0.4 sq cm performed by Worthy Keeler, PA. With the following instrument(s): Curette to remove Viable and Non-Viable tissue/material. Material removed includes Callus, Subcutaneous Tissue, and Skin: Epidermis after achieving pain control using Other (benzocaine 20% spray). No specimens were taken. A time out was conducted at 08:45, prior to the start of the procedure. A Minimum amount of bleeding was controlled with Pressure. The procedure was tolerated well with a pain level of 0 throughout and a pain level of 0 following the procedure. Post Debridement Measurements: 0.5cm length x 0.9cm width x 0.1cm depth; 0.035cm^3 volume. Character of Wound/Ulcer Post Debridement is improved. Severity of Tissue  Post Debridement is: Fat layer exposed. Post procedure Diagnosis Wound #3: Same as Pre-Procedure Wound #4 Pre-procedure diagnosis of Wound #4 is a Diabetic Wound/Ulcer of the Lower Extremity located on the Right,Plantar T Great .Severity of Tissue Pre oe Debridement is: Fat layer exposed. There was a Excisional Skin/Subcutaneous Tissue Debridement with a total area of 3.96 sq cm performed by Worthy Keeler, PA. With the following instrument(s): Curette to remove Viable and Non-Viable tissue/material. Material removed includes Callus, Subcutaneous Tissue, Slough, and Skin: Epidermis after achieving pain control using Other (benzocaine 20% spray). No specimens were taken. A time out was conducted at 08:45, prior to the start of the procedure. A Minimum amount of bleeding was controlled with Pressure. The procedure was tolerated  well with a pain level of 0 throughout and a pain level of 0 following the procedure. Post Debridement Measurements: 2.2cm length x 1.8cm width x 0.1cm depth; 0.311cm^3 volume. Character of Wound/Ulcer Post Debridement is improved. Severity of Tissue Post Debridement is: Fat layer exposed. Post procedure Diagnosis Wound #4: Same as Pre-Procedure Plan Follow-up Appointments: Return Appointment in 1 week. Bathing/ Shower/ Hygiene: May shower and wash wound with soap and water. Off-Loading: Open toe surgical shoe to: - both feet The following medication(s) was prescribed: benzocaine topical 20 % aerosol aerosol topical for prior to debridement was prescribed at facility WOUND #2: - T Fifth Wound Laterality: Plantar, Right oe Peri-Wound Care: Sween Lotion (Moisturizing lotion) Every Other Day/30 Days Discharge Instructions: Apply moisturizing lotion to feet with dressing changes Prim Dressing: KerraCel Ag Gelling Fiber Dressing, 2x2 in (silver alginate) Every Other Day/30 Days ary Discharge Instructions: Apply silver alginate to wound bed as instructed Secondary  Dressing: Woven Gauze Sponges 2x2 in Every Other Day/30 Days Discharge Instructions: Apply over primary dressing as directed. Secured With: Child psychotherapist, Sterile 2x75 (in/in) Every Other Day/30 Days Discharge Instructions: Secure with stretch gauze as directed. WOUND #3: - T Second Wound Laterality: Plantar, Right oe Peri-Wound Care: Sween Lotion (Moisturizing lotion) Every Other Day/30 Days Discharge Instructions: Apply moisturizing lotion to feet with dressing changes Prim Dressing: KerraCel Ag Gelling Fiber Dressing, 2x2 in (silver alginate) Every Other Day/30 Days ary Discharge Instructions: Apply silver alginate to wound bed as instructed Secondary Dressing: Woven Gauze Sponges 2x2 in Every Other Day/30 Days Discharge Instructions: Apply over primary dressing as directed. Secured With: Child psychotherapist, Sterile 2x75 (in/in) Every Other Day/30 Days Discharge Instructions: Secure with stretch gauze as directed. WOUND #4: - T Great Wound Laterality: Plantar, Right oe Peri-Wound Care: Sween Lotion (Moisturizing lotion) Every Other Day/30 Days Discharge Instructions: Apply moisturizing lotion to feet with dressing changes Prim Dressing: KerraCel Ag Gelling Fiber Dressing, 2x2 in (silver alginate) Every Other Day/30 Days ary Discharge Instructions: Apply silver alginate to wound bed as instructed Secondary Dressing: Woven Gauze Sponges 2x2 in Every Other Day/30 Days Discharge Instructions: Apply over primary dressing as directed. Secured With: Child psychotherapist, Sterile 2x75 (in/in) Every Other Day/30 Days Discharge Instructions: Secure with stretch gauze as directed. 1. Would recommend currently that we go ahead and continue with the wound care measures as before specifically with regard to the silver alginate dressing that seems to be doing very well for him. 2. I recommend that he change this every other day although if he needs to  change it sooner he can. 3. I would recommend he continue to protect the toes to prevent any additional injury at this point. We will see patient back for reevaluation in 1 week here in the clinic. If anything worsens or changes patient will contact our office for additional recommendations. Electronic Signature(s) Signed: 10/10/2020 8:59:26 AM By: Worthy Keeler PA-C Entered By: Worthy Keeler on 10/10/2020 08:59:25 -------------------------------------------------------------------------------- SuperBill Details Patient Name: Date of Service: [name] 10/10/2020 Medical Record Number: [id number] Patient Account Number: [id number] Date of Birth/Sex: Treating RN: 03-29-1951 (69 y.o. [name] Primary Care Provider: Grier Mitts Other Clinician: Referring Provider: Treating Provider/Extender: Natalia Leatherwood Weeks in Treatment: 2 Diagnosis Coding ICD-10 Codes Code Description [phone] Type 2 diabetes mellitus with foot ulcer L97.522 Non-pressure chronic ulcer of other part of left foot with fat layer exposed L97.512 Non-pressure chronic ulcer of other part of right  foot with fat layer exposed I10 Essential (primary) hypertension N42.9 Disorder of prostate, unspecified L84 Corns and callosities Facility Procedures The patient participates with Medicare or their insurance follows the Medicare Facility Guidelines: CPT4 Code Description Modifier Quantity [id number] - DEB SUBQ TISSUE 20 SQ CM/< 1 ICD-10 Diagnosis Description L97.512 Non-pressure chronic ulcer of  other part of right foot with fat layer exposed Physician Procedures : CPT4 Code Description Modifier [id number] - WC PHYS SUBQ TISS 20 SQ CM ICD-10 Diagnosis Description L97.512 Non-pressure chronic ulcer of other part of right foot with fat layer exposed Quantity: 1 Electronic Signature(s) Signed: 10/10/2020 8:59:43 AM By: Worthy Keeler PA-C Entered By: Worthy Keeler on  10/10/2020 08:59:43

## 2020-10-11 DIAGNOSIS — G4721 Circadian rhythm sleep disorder, delayed sleep phase type: Secondary | ICD-10-CM | POA: Diagnosis not present

## 2020-10-17 ENCOUNTER — Encounter (HOSPITAL_BASED_OUTPATIENT_CLINIC_OR_DEPARTMENT_OTHER): Payer: Medicare HMO | Admitting: Physician Assistant

## 2020-10-17 ENCOUNTER — Other Ambulatory Visit: Payer: Self-pay

## 2020-10-17 DIAGNOSIS — L97512 Non-pressure chronic ulcer of other part of right foot with fat layer exposed: Secondary | ICD-10-CM | POA: Diagnosis not present

## 2020-10-17 DIAGNOSIS — E11621 Type 2 diabetes mellitus with foot ulcer: Secondary | ICD-10-CM | POA: Diagnosis not present

## 2020-10-17 NOTE — Progress Notes (Signed)
[name], [name] ([id number]) Visit Report for 10/17/2020 Chief Complaint Document Details Patient Name: Date of Service: [name] 10/17/2020 9:45 [name] M Medical Record Number: [id number] Patient Account Number: [id number] Date of Birth/Sex: Treating RN: [name] (69 y.o. [name] Primary Care Provider: Grier [name] Other Clinician: Referring Provider: Treating Provider/Extender: [name]: 3 Information Obtained from: Patient Chief Complaint Bilateral T Ulcers oe Electronic Signature(s) Signed: 10/17/2020 9:52:49 AM By: Worthy Keeler PA-C Entered By: Worthy [name] on 10/17/2020 09:52:48 -------------------------------------------------------------------------------- Debridement Details Patient Name: Date of Service: [name], [name] 10/17/2020 9:45 [name] M Medical Record Number: [id number] Patient Account Number: [id number] Date of Birth/Sex: Treating RN: [birth year] [name] (69 y.o. [name] Primary Care Provider: Grier [name] Other Clinician: Referring Provider: Treating Provider/Extender: [name]: 3 Debridement Performed for Assessment: Wound #3 Right,Plantar T Second oe Performed By: Physician Worthy Keeler, PA Debridement Type: Debridement Severity of Tissue Pre Debridement: Fat layer exposed Level of Consciousness (Pre-procedure): Awake and Alert Pre-procedure Verification/Time Out Yes - 09:58 Taken: Start Time: 09:58 T Area Debrided (L x W): otal 0.2 (cm) x 0.2 (cm) = 0.04 (cm) Tissue and other material debrided: Non-Viable, Callus Level: Non-Viable Tissue Debridement Description: Selective/Open Wound Instrument: Curette Bleeding: None End Time: 09:59 Procedural Pain: 0 Post Procedural Pain: 0 Response to [name]: Procedure was tolerated well Level of Consciousness (Post- Awake and Alert procedure): Post Debridement Measurements of Total  Wound Length: (cm) 0.2 Width: (cm) 0.2 Depth: (cm) 0.1 Volume: (cm) 0.003 Character of Wound/Ulcer Post Debridement: Improved Severity of Tissue Post Debridement: Fat layer exposed Post Procedure Diagnosis Same as Pre-procedure Electronic Signature(s) Signed: 10/17/2020 5:13:59 PM By: Worthy Keeler PA-C Signed: 10/17/2020 7:00:57 PM By: [name] Entered By: [name] on 10/17/2020 10:00:16 -------------------------------------------------------------------------------- Debridement Details Patient Name: Date of Service: [name], [name] 10/17/2020 9:45 [name] M Medical Record Number: [id number] Patient Account Number: [id number] Date of Birth/Sex: Treating RN: [birth year] [name] (69 y.o. [name] Primary Care Provider: Grier [name] Other Clinician: Referring Provider: Treating Provider/Extender: [name]: 3 Debridement Performed for Assessment: Wound #2 Right,Plantar T Fifth oe Performed By: Physician Worthy Keeler, PA Debridement Type: Debridement Severity of Tissue Pre Debridement: Fat layer exposed Level of Consciousness (Pre-procedure): Awake and Alert Pre-procedure Verification/Time Out Yes - 09:58 Taken: Start Time: 09:58 T Area Debrided (L x W): otal 0.4 (cm) x 0.6 (cm) = 0.24 (cm) Tissue and other material debrided: Viable, Non-Viable, Callus, Subcutaneous Level: Skin/Subcutaneous Tissue Debridement Description: Excisional Instrument: Curette Bleeding: Minimum Hemostasis Achieved: Pressure End Time: 09:59 Procedural Pain: 0 Post Procedural Pain: 0 Response to [name]: Procedure was tolerated well Level of Consciousness (Post- Awake and Alert procedure): Post Debridement Measurements of Total Wound Length: (cm) 0.4 Width: (cm) 0.6 Depth: (cm) 0.1 Volume: (cm) 0.019 Character of Wound/Ulcer Post Debridement: Improved Severity of Tissue Post Debridement: Fat layer exposed Post Procedure  Diagnosis Same as Pre-procedure Electronic Signature(s) Signed: 10/17/2020 5:13:59 PM By: Worthy Keeler PA-C Signed: 10/17/2020 7:00:57 PM By: [name] Entered By: [name] on 10/17/2020 10:01:00 -------------------------------------------------------------------------------- Debridement Details Patient Name: Date of Service: [name], [name] 10/17/2020 9:45 [name] M Medical Record Number: [id number] Patient Account Number: [id number] Date of Birth/Sex: Treating RN: [birth year] (69 y.o. [name] Primary Care Provider: Other Clinician: Grier [name] Referring Provider: Treating Provider/Extender: [name]: 3 Debridement  Performed for Assessment: Wound #4 Right,Plantar T Great oe Performed By: Physician Worthy Keeler, PA Debridement Type: Debridement Severity of Tissue Pre Debridement: Fat layer exposed Level of Consciousness (Pre-procedure): Awake and Alert Pre-procedure Verification/Time Out Yes - 09:58 Taken: Start Time: 09:58 T Area Debrided (L x W): otal 1.7 (cm) x 1.4 (cm) = 2.38 (cm) Tissue and other material debrided: Non-Viable, Callus, Slough, Subcutaneous, Slough Level: Skin/Subcutaneous Tissue Debridement Description: Excisional Instrument: Curette Bleeding: Minimum Hemostasis Achieved: Silver Nitrate End Time: 09:59 Procedural Pain: 0 Post Procedural Pain: 0 Response to [name]: Procedure was tolerated well Level of Consciousness (Post- Awake and Alert procedure): Post Debridement Measurements of Total Wound Length: (cm) 1.7 Width: (cm) 1.4 Depth: (cm) 0.1 Volume: (cm) 0.187 Character of Wound/Ulcer Post Debridement: Improved Severity of Tissue Post Debridement: Fat layer exposed Post Procedure Diagnosis Same as Pre-procedure Electronic Signature(s) Signed: 10/17/2020 5:13:59 PM By: Worthy Keeler PA-C Signed: 10/17/2020 7:00:57 PM By: [name] Entered By: [name] on 10/17/2020 10:02:16 -------------------------------------------------------------------------------- HPI Details Patient Name: Date of Service: [name], [name] 10/17/2020 9:45 [name] M Medical Record Number: [id number] Patient Account Number: [id number] Date of Birth/Sex: Treating RN: [name] (69 y.o. [name] Primary Care Provider: Grier [name] Other Clinician: Referring Provider: Treating Provider/Extender: [name]: 3 History of Present Illness HPI Description: 09/26/2020 on evaluation today patient appears to be doing somewhat poorly in regard to his bilateral feet at this point due to issues that he is been having that developed about 2 weeks ago. He was walking home which was about [name] mile and states that he first noted the areas on the right foot beginning. These were blisters that develop. The left started Thanksgiving day when he was up standing more cooking. He did see his primary care provider who referred him to podiatry. He saw Dr. Posey Pronto and Dr. Posey Pronto recommended Betadine moistened gauze dressings to the wounds and to follow-up in 2 weeks. With that being said the patient subsequently was concerned about infection 2 to 3 days later as was his family member who is with him today. I believe this was [name] sister. With that being said subsequently he ended up going to urgent care at that point he was given Diflucan and Keflex he is done with both at this time. He is could be canceling the appointment with Dr. Posey Pronto they tell me at this point. His most recent hemoglobin A1c was 6.6 on November 12. His ABIs were 1.2 on the right and 1.03 on the left and appear to be doing excellent. With that being said he does not have diabetic shoes and I feel like the shoes were likely the culprit for what led to this issue currently. There does not appear to be any signs of systemic infection nor local infection at this point which is  great news. The patient does have [name] history of hypertension as well as potentially [name] recurrence of prostate cancer he is being followed by the cancer center for that at this point 10/03/2020 upon evaluation today patient appears to be doing better in regard to his toe ulcers. He has been tolerating the dressing changes without complication. Fortunately there is no signs of active infection at this time. The right great toe and fifth toe are the 2 worst out of everything here he has [name] couple that have healed on the left foot there is really nothing remaining open though he has several areas of callus  on the left foot in fact 3 on the third fourth and fifth toes. That is going need to be removed today as well. 10/10/2020 upon evaluation today patient appears to be doing well in regard to his wounds. He still has wounds open on the first, second, and fifth toes of the right foot. Left foot is completely healed and the right fourth toe is also completely healed today. Overall very pleased with how things seem to be progressing. 10/17/2020 on evaluation today patient appears to be doing much better in regard to the right first, second, and fifth toes. He is making excellent progress and to be honest I am extremely pleased with where things stand today. Fortunately there is no signs of active infection at this time. No fevers, chills, nausea, vomiting, or diarrhea. Electronic Signature(s) Signed: 10/17/2020 10:10:09 AM By: Worthy Keeler PA-C Entered By: Worthy [name] on 10/17/2020 10:10:08 -------------------------------------------------------------------------------- Physical Exam Details Patient Name: Date of Service: [name], [name] 10/17/2020 9:45 [name] M Medical Record Number: [id number] Patient Account Number: [id number] Date of Birth/Sex: Treating RN: [birth year][name] (69 y.o. [name] Primary Care Provider: Grier [name] Other Clinician: Referring Provider: Treating Provider/Extender:  [name]: 3 Constitutional Well-nourished and well-hydrated in no acute distress. Respiratory normal breathing without difficulty. Psychiatric this patient is able to make decisions and demonstrates good insight into disease process. Alert and Oriented x 3. pleasant and cooperative. Notes Upon inspection patient's wounds did require some sharp debridement to clear away some of the necrotic debris and callus around the edges of the wound on the surface of the wound. This was performed without complication today post debridement wound beds all appear to be doing better I am very pleased at this point. Electronic Signature(s) Signed: 10/17/2020 10:10:27 AM By: Worthy Keeler PA-C Entered By: Worthy [name] on 10/17/2020 10:10:26 -------------------------------------------------------------------------------- Physician Orders Details Patient Name: Date of Service: [name], [name] 10/17/2020 9:45 [name] M Medical Record Number: [id number] Patient Account Number: [id number] Date of Birth/Sex: Treating RN: [birth year] (69 y.o. [name] Primary Care Provider: Grier [name] Other Clinician: Referring Provider: Treating Provider/Extender: [name]: 3 Verbal / Phone Orders: No Diagnosis Coding ICD-10 Coding Code Description E11.621 Type 2 diabetes mellitus with foot ulcer L97.522 Non-pressure chronic ulcer of other part of left foot with fat layer exposed L97.512 Non-pressure chronic ulcer of other part of right foot with fat layer exposed I10 Essential (primary) hypertension N42.9 Disorder of prostate, unspecified L84 Corns and callosities Follow-up Appointments Return appointment in 3 weeks. Bathing/ Shower/ Hygiene May shower and wash wound with soap and water. Off-Loading Open toe surgical shoe to: - both feet Wound [name] Wound #2 - T Fifth oe Wound Laterality: Plantar,  Right Peri-Wound Care: Sween Lotion (Moisturizing lotion) Every Other Day/30 Days Discharge Instructions: Apply moisturizing lotion to feet with dressing changes Prim Dressing: KerraCel Ag Gelling Fiber Dressing, 2x2 in (silver alginate) Every Other Day/30 Days ary Discharge Instructions: Apply silver alginate to wound bed as instructed Secondary Dressing: Woven Gauze Sponges 2x2 in Every Other Day/30 Days Discharge Instructions: Apply over primary dressing as directed. Secured With: Child psychotherapist, Sterile 2x75 (in/in) Every Other Day/30 Days Discharge Instructions: Secure with stretch gauze as directed. Secured With: Paper Tape, 1x10 (in/yd) Every Other Day/30 Days Discharge Instructions: Secure dressing with tape as directed. Wound #3 - T Second oe Wound Laterality: Plantar, Right Peri-Wound Care: Sween Lotion (Moisturizing lotion)  Every Other Day/30 Days Discharge Instructions: Apply moisturizing lotion to feet with dressing changes Prim Dressing: KerraCel Ag Gelling Fiber Dressing, 2x2 in (silver alginate) Every Other Day/30 Days ary Discharge Instructions: Apply silver alginate to wound bed as instructed Secondary Dressing: Woven Gauze Sponges 2x2 in Every Other Day/30 Days Discharge Instructions: Apply over primary dressing as directed. Secured With: Child psychotherapist, Sterile 2x75 (in/in) Every Other Day/30 Days Discharge Instructions: Secure with stretch gauze as directed. Secured With: Paper Tape, 1x10 (in/yd) Every Other Day/30 Days Discharge Instructions: Secure dressing with tape as directed. Wound #4 - T Great oe Wound Laterality: Plantar, Right Peri-Wound Care: Sween Lotion (Moisturizing lotion) Every Other Day/30 Days Discharge Instructions: Apply moisturizing lotion to feet with dressing changes Prim Dressing: KerraCel Ag Gelling Fiber Dressing, 2x2 in (silver alginate) Every Other Day/30 Days ary Discharge Instructions: Apply silver  alginate to wound bed as instructed Secondary Dressing: Woven Gauze Sponges 2x2 in Every Other Day/30 Days Discharge Instructions: Apply over primary dressing as directed. Secured With: Child psychotherapist, Sterile 2x75 (in/in) Every Other Day/30 Days Discharge Instructions: Secure with stretch gauze as directed. Secured With: Paper Tape, 1x10 (in/yd) Every Other Day/30 Days Discharge Instructions: Secure dressing with tape as directed. Electronic Signature(s) Signed: 10/17/2020 5:13:59 PM By: Worthy Keeler PA-C Signed: 10/17/2020 7:00:57 PM By: [name] Entered By: [name] on 10/17/2020 10:08:00 -------------------------------------------------------------------------------- Problem List Details Patient Name: Date of Service: [name], [name] 10/17/2020 9:45 [name] M Medical Record Number: [id number] Patient Account Number: [id number] Date of Birth/Sex: Treating RN: [birth year][name] (69 y.o. [name] Primary Care Provider: Grier [name] Other Clinician: Referring Provider: Treating Provider/Extender: [name]: 3 Active Problems ICD-10 Encounter Code Description Active Date MDM Diagnosis E11.621 Type 2 diabetes mellitus with foot ulcer 09/26/2020 No Yes L97.522 Non-pressure chronic ulcer of other part of left foot with fat layer exposed 09/26/2020 No Yes L97.512 Non-pressure chronic ulcer of other part of right foot with fat layer exposed 09/26/2020 No Yes I10 Essential (primary) hypertension 09/26/2020 No Yes N42.9 Disorder of prostate, unspecified 09/26/2020 No Yes L84 Corns and callosities 10/03/2020 No Yes Inactive Problems Resolved Problems Electronic Signature(s) Signed: 10/17/2020 9:52:25 AM By: Worthy Keeler PA-C Entered By: Worthy [name] on 10/17/2020 09:52:25 -------------------------------------------------------------------------------- Progress Note Details Patient Name: Date of  Service: [name], [name] 10/17/2020 9:45 [name] M Medical Record Number: [id number] Patient Account Number: [id number] Date of Birth/Sex: Treating RN: [name] (69 y.o. [name] Primary Care Provider: Grier [name] Other Clinician: Referring Provider: Treating Provider/Extender: [name]: 3 Subjective Chief Complaint Information obtained from Patient Bilateral T Ulcers oe History of Present Illness (HPI) 09/26/2020 on evaluation today patient appears to be doing somewhat poorly in regard to his bilateral feet at this point due to issues that he is been having that developed about 2 weeks ago. He was walking home which was about [name] mile and states that he first noted the areas on the right foot beginning. These were blisters that develop. The left started Thanksgiving day when he was up standing more cooking. He did see his primary care provider who referred him to podiatry. He saw Dr. Posey Pronto and Dr. Posey Pronto recommended Betadine moistened gauze dressings to the wounds and to follow-up in 2 weeks. With that being said the patient subsequently was concerned about infection 2 to 3 days later as was his family member who is with him  today. I believe this was [name] sister. With that being said subsequently he ended up going to urgent care at that point he was given Diflucan and Keflex he is done with both at this time. He is could be canceling the appointment with Dr. Posey Pronto they tell me at this point. His most recent hemoglobin A1c was 6.6 on November 12. His ABIs were 1.2 on the right and 1.03 on the left and appear to be doing excellent. With that being said he does not have diabetic shoes and I feel like the shoes were likely the culprit for what led to this issue currently. There does not appear to be any signs of systemic infection nor local infection at this point which is great news. The patient does have [name] history of hypertension as well as  potentially [name] recurrence of prostate cancer he is being followed by the cancer center for that at this point 10/03/2020 upon evaluation today patient appears to be doing better in regard to his toe ulcers. He has been tolerating the dressing changes without complication. Fortunately there is no signs of active infection at this time. The right great toe and fifth toe are the 2 worst out of everything here he has [name] couple that have healed on the left foot there is really nothing remaining open though he has several areas of callus on the left foot in fact 3 on the third fourth and fifth toes. That is going need to be removed today as well. 10/10/2020 upon evaluation today patient appears to be doing well in regard to his wounds. He still has wounds open on the first, second, and fifth toes of the right foot. Left foot is completely healed and the right fourth toe is also completely healed today. Overall very pleased with how things seem to be progressing. 10/17/2020 on evaluation today patient appears to be doing much better in regard to the right first, second, and fifth toes. He is making excellent progress and to be honest I am extremely pleased with where things stand today. Fortunately there is no signs of active infection at this time. No fevers, chills, nausea, vomiting, or diarrhea. Objective Constitutional Well-nourished and well-hydrated in no acute distress. Vitals Time Taken: 9:39 AM, Height: 66 in, Weight: 159 lbs, BMI: 25.7, Temperature: 98 F, Pulse: 72 bpm, Respiratory Rate: 17 breaths/min, Blood Pressure: 125/70 mmHg, Capillary Blood Glucose: 189 mg/dl. Respiratory normal breathing without difficulty. Psychiatric this patient is able to make decisions and demonstrates good insight into disease process. Alert and Oriented x 3. pleasant and cooperative. General Notes: Upon inspection patient's wounds did require some sharp debridement to clear away some of the necrotic debris and  callus around the edges of the wound on the surface of the wound. This was performed without complication today post debridement wound beds all appear to be doing better I am very pleased at this point. Integumentary (Hair, Skin) Wound #2 status is Open. Original cause of wound was Blister. The wound is located on the Right,Plantar T Fifth. The wound measures 0.4cm length x 0.6cm oe width x 0.1cm depth; 0.188cm^2 area and 0.019cm^3 volume. There is Fat Layer (Subcutaneous Tissue) exposed. There is no tunneling or undermining noted. There is [name] medium amount of serosanguineous drainage noted. The wound margin is flat and intact. There is medium (34-66%) pink granulation within the wound bed. There is [name] medium (34-66%) amount of necrotic tissue within the wound bed including Adherent Slough. Wound #3 status is Open. Original cause  of wound was Blister. The wound is located on the Right,Plantar T Second. The wound measures 0.2cm length x oe 0.2cm width x 0.1cm depth; 0.031cm^2 area and 0.003cm^3 volume. There is Fat Layer (Subcutaneous Tissue) exposed. There is no tunneling or undermining noted. There is [name] medium amount of serosanguineous drainage noted. The wound margin is flat and intact. There is large (67-100%) pink granulation within the wound bed. There is no necrotic tissue within the wound bed. Wound #4 status is Open. Original cause of wound was Blister. The wound is located on the [employer]. The wound measures 1.7cm length x oe 1.4cm width x 0.1cm depth; 1.869cm^2 area and 0.187cm^3 volume. There is Fat Layer (Subcutaneous Tissue) exposed. There is no tunneling or undermining noted. There is [name] medium amount of serosanguineous drainage noted. The wound margin is flat and intact. There is large (67-100%) pink granulation within the wound bed. There is no necrotic tissue within the wound bed. Assessment Active Problems ICD-10 Type 2 diabetes mellitus with foot ulcer Non-pressure  chronic ulcer of other part of left foot with fat layer exposed Non-pressure chronic ulcer of other part of right foot with fat layer exposed Essential (primary) hypertension Disorder of prostate, unspecified Corns and callosities Procedures Wound #2 Pre-procedure diagnosis of Wound #2 is [name] Diabetic Wound/Ulcer of the Lower Extremity located on the Right,Plantar T Fifth .Severity of Tissue Pre oe Debridement is: Fat layer exposed. There was [name] Excisional Skin/Subcutaneous Tissue Debridement with [name] total area of 0.24 sq cm performed by Worthy Keeler, PA. With the following instrument(s): Curette to remove Viable and Non-Viable tissue/material. Material removed includes Callus and Subcutaneous Tissue and. No specimens were taken. [name] time out was conducted at 09:58, prior to the start of the procedure. [name] Minimum amount of bleeding was controlled with Pressure. The procedure was tolerated well with [name] pain level of 0 throughout and [name] pain level of 0 following the procedure. Post Debridement Measurements: 0.4cm length x 0.6cm width x 0.1cm depth; 0.019cm^3 volume. Character of Wound/Ulcer Post Debridement is improved. Severity of Tissue Post Debridement is: Fat layer exposed. Post procedure Diagnosis Wound #2: Same as Pre-Procedure Wound #3 Pre-procedure diagnosis of Wound #3 is [name] Diabetic Wound/Ulcer of the Lower Extremity located on the Right,Plantar T Second .Severity of Tissue Pre oe Debridement is: Fat layer exposed. There was [name] Selective/Open Wound Non-Viable Tissue Debridement with [name] total area of 0.04 sq cm performed by Worthy Keeler, PA. With the following instrument(s): Curette to remove Non-Viable tissue/material. Material removed includes Callus. No specimens were taken. [name] time out was conducted at 09:58, prior to the start of the procedure. There was no bleeding. The procedure was tolerated well with [name] pain level of 0 throughout and [name] pain level of 0 following the procedure. Post  Debridement Measurements: 0.2cm length x 0.2cm width x 0.1cm depth; 0.003cm^3 volume. Character of Wound/Ulcer Post Debridement is improved. Severity of Tissue Post Debridement is: Fat layer exposed. Post procedure Diagnosis Wound #3: Same as Pre-Procedure Wound #4 Pre-procedure diagnosis of Wound #4 is [name] Diabetic Wound/Ulcer of the Lower Extremity located on the Right,Plantar T Great .Severity of Tissue Pre oe Debridement is: Fat layer exposed. There was [name] Excisional Skin/Subcutaneous Tissue Debridement with [name] total area of 2.38 sq cm performed by Worthy Keeler, PA. With the following instrument(s): Curette to remove Non-Viable tissue/material. Material removed includes Callus, Subcutaneous Tissue, and Slough. No specimens were taken. [name] time out was conducted at 09:58, prior to the  start of the procedure. [name] Minimum amount of bleeding was controlled with Silver Nitrate. The procedure was tolerated well with [name] pain level of 0 throughout and [name] pain level of 0 following the procedure. Post Debridement Measurements: 1.7cm length x 1.4cm width x 0.1cm depth; 0.187cm^3 volume. Character of Wound/Ulcer Post Debridement is improved. Severity of Tissue Post Debridement is: Fat layer exposed. Post procedure Diagnosis Wound #4: Same as Pre-Procedure Plan Follow-up Appointments: Return appointment in 3 weeks. Bathing/ Shower/ Hygiene: May shower and wash wound with soap and water. Off-Loading: Open toe surgical shoe to: - both feet WOUND #2: - T Fifth Wound Laterality: Plantar, Right oe Peri-Wound Care: Sween Lotion (Moisturizing lotion) Every Other Day/30 Days Discharge Instructions: Apply moisturizing lotion to feet with dressing changes Prim Dressing: KerraCel Ag Gelling Fiber Dressing, 2x2 in (silver alginate) Every Other Day/30 Days ary Discharge Instructions: Apply silver alginate to wound bed as instructed Secondary Dressing: Woven Gauze Sponges 2x2 in Every Other Day/30 Days Discharge  Instructions: Apply over primary dressing as directed. Secured With: Child psychotherapist, Sterile 2x75 (in/in) Every Other Day/30 Days Discharge Instructions: Secure with stretch gauze as directed. Secured With: Paper T ape, 1x10 (in/yd) Every Other Day/30 Days Discharge Instructions: Secure dressing with tape as directed. WOUND #3: - T Second Wound Laterality: Plantar, Right oe Peri-Wound Care: Sween Lotion (Moisturizing lotion) Every Other Day/30 Days Discharge Instructions: Apply moisturizing lotion to feet with dressing changes Prim Dressing: KerraCel Ag Gelling Fiber Dressing, 2x2 in (silver alginate) Every Other Day/30 Days ary Discharge Instructions: Apply silver alginate to wound bed as instructed Secondary Dressing: Woven Gauze Sponges 2x2 in Every Other Day/30 Days Discharge Instructions: Apply over primary dressing as directed. Secured With: Child psychotherapist, Sterile 2x75 (in/in) Every Other Day/30 Days Discharge Instructions: Secure with stretch gauze as directed. Secured With: Paper T ape, 1x10 (in/yd) Every Other Day/30 Days Discharge Instructions: Secure dressing with tape as directed. WOUND #4: - T Great Wound Laterality: Plantar, Right oe Peri-Wound Care: Sween Lotion (Moisturizing lotion) Every Other Day/30 Days Discharge Instructions: Apply moisturizing lotion to feet with dressing changes Prim Dressing: KerraCel Ag Gelling Fiber Dressing, 2x2 in (silver alginate) Every Other Day/30 Days ary Discharge Instructions: Apply silver alginate to wound bed as instructed Secondary Dressing: Woven Gauze Sponges 2x2 in Every Other Day/30 Days Discharge Instructions: Apply over primary dressing as directed. Secured With: Child psychotherapist, Sterile 2x75 (in/in) Every Other Day/30 Days Discharge Instructions: Secure with stretch gauze as directed. Secured With: Paper T ape, 1x10 (in/yd) Every Other Day/30 Days Discharge Instructions:  Secure dressing with tape as directed. 1. I would recommend currently that we go ahead and continue with the wound care measures as before we are currently utilizing the silver alginate dressing which I think seems to be doing well in general. I would recommend that we consider continuing this at this point. 2. Also can recommend the patient continue to use the offloading shoe to try to keep pressure off of his toes. 3. I would also recommend that he continue to monitor for any signs of worsening infection if anything occurs he should let me know soon as possible. We will see patient back for reevaluation in 3 weeks here in the clinic. If anything worsens or changes patient will contact our office for additional recommendations. Electronic Signature(s) Signed: 10/17/2020 10:11:24 AM By: Worthy Keeler PA-C Entered By: Worthy [name] on 10/17/2020 10:11:23 -------------------------------------------------------------------------------- SuperBill Details Patient Name: Date of Service: [name], [name]  [name] 10/17/2020 Medical Record Number: [id number] Patient Account Number: [id number] Date of Birth/Sex: Treating RN: [name] (69 y.o. 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 L97.512 Non-pressure chronic ulcer of other part of right foot with fat layer exposed I10 Essential (primary) hypertension N42.9 Disorder of prostate, unspecified L84 Corns and callosities Facility Procedures The patient participates with Medicare or their insurance follows the Medicare Facility Guidelines: CPT4 Code Description Modifier Quantity [id number] - DEB SUBQ TISSUE 20 SQ CM/< 1 ICD-10  Diagnosis Description L97.512 Non-pressure chronic ulcer of  other part of right foot with fat layer exposed The patient participates with Medicare or their insurance follows the Medicare Facility Guidelines: [id number] - DEBRIDE WOUND 1ST 20 SQ CM OR < 1 ICD-10 Diagnosis Description L97.512 Non-pressure chronic ulcer of other part of right foot with fat  layer exposed Physician Procedures : CPT4 Code Description Modifier [id number] - WC PHYS SUBQ TISS 20 SQ CM ICD-10 Diagnosis Description L97.512 Non-pressure chronic ulcer of other part of right foot with fat layer exposed Quantity: 1 : [id number] - WC PHYS DEBR WO ANESTH 20 SQ CM ICD-10 Diagnosis Description L97.512 Non-pressure chronic ulcer of other part of right foot with fat layer exposed Quantity: 1 Electronic Signature(s) Signed: 10/17/2020 10:11:43 AM By: Worthy Keeler PA-C Entered By: Worthy [name] on 10/17/2020 10:11:42

## 2020-10-17 NOTE — Progress Notes (Signed)
TOR, TSUDA ([id number]) Visit Report for 10/17/2020 Arrival Information Details Patient Name: Date of Service: [name] 10/17/2020 9:45 [name] M Medical Record Number: [id number] Patient Account Number: [id number] Date of Birth/Sex: Treating RN: Mar 10, 1951 (69 y.o. Burnadette Pop, Lauren Primary Care Candler Ginsberg: Grier Mitts Other Clinician: Referring Martie Fulgham: Treating Danijela Vessey/Extender: Natalia Leatherwood Weeks in Treatment: 3 Visit Information History Since Last Visit Added or deleted any medications: No Patient Arrived: Ambulatory Any new allergies or adverse reactions: No Arrival Time: 09:39 Had [name] fall or experienced change in No Accompanied By: self activities of daily living that may affect Transfer Assistance: None risk of falls: Patient Identification Verified: Yes Signs or symptoms of abuse/neglect since last visito No Secondary Verification Process Completed: Yes Hospitalized since last visit: No Patient Requires Transmission-Based Precautions: No Implantable device outside of the clinic excluding No Patient Has Alerts: No cellular tissue based products placed in the center since last visit: Has Dressing in Place as Prescribed: Yes Pain Present Now: No Electronic Signature(s) Signed: 10/17/2020 5:16:00 PM By: Rhae Hammock RN Entered By: Rhae Hammock on 10/17/2020 09:39:45 -------------------------------------------------------------------------------- Encounter Discharge Information Details Patient Name: Date of Service: [name], [name] 10/17/2020 9:45 [name] M Medical Record Number: [id number] Patient Account Number: [id number] Date of Birth/Sex: Treating RN: 1951-10-03 (69 y.o. Oval Linsey Primary Care Siddhi Dornbush: Grier Mitts Other Clinician: Referring Maude Gloor: Treating Ayce Pietrzyk/Extender: Natalia Leatherwood Weeks in Treatment: 3 Encounter Discharge Information Items Post Procedure Vitals Discharge Condition:  Stable Temperature (F): 98 Ambulatory Status: Ambulatory Pulse (bpm): 72 Discharge Destination: Home Respiratory Rate (breaths/min): 17 Transportation: Private Auto Blood Pressure (mmHg): 125/70 Accompanied By: self Schedule Follow-up Appointment: Yes Clinical Summary of Care: Patient Declined Electronic Signature(s) Signed: 10/17/2020 5:14:06 PM By: Carlene Coria RN Entered By: Carlene Coria on 10/17/2020 10:18:34 -------------------------------------------------------------------------------- Lower Extremity Assessment Details Patient Name: Date of Service: [name] 10/17/2020 9:45 [name] M Medical Record Number: [id number] Patient Account Number: [id number] Date of Birth/Sex: Treating RN: [birth year] (69 y.o. Burnadette Pop, Lauren Primary Care Taino Maertens: Grier Mitts Other Clinician: Referring Azalynn Maxim: Treating Naiyana Barbian/Extender: Natalia Leatherwood Weeks in Treatment: 3 Edema Assessment Assessed: [Left: No] Patrice Paradise: No] Edema: [Left: N] [Right: o] Calf Left: Right: Point of Measurement: 37 cm From Medial Instep 33 cm Ankle Left: Right: Point of Measurement: 12 cm From Medial Instep 21 cm Vascular Assessment Pulses: Dorsalis Pedis Palpable: [Right:Yes] Posterior Tibial Palpable: [Right:Yes] Electronic Signature(s) Signed: 10/17/2020 5:16:00 PM By: Rhae Hammock RN Entered By: Rhae Hammock on 10/17/2020 09:45:42 -------------------------------------------------------------------------------- Multi-Disciplinary Care Plan Details Patient Name: Date of Service: [name], [name] 10/17/2020 9:45 [name] M Medical Record Number: [id number] Patient Account Number: [id number] Date of Birth/Sex: Treating RN: 1951/03/17 (69 y.o. [name] Primary Care Cordney Barstow: Grier Mitts Other Clinician: Referring Timothey Dahlstrom: Treating Santez Woodcox/Extender: Natalia Leatherwood Weeks in Treatment: 3 Active Inactive Nutrition Nursing  Diagnoses: Impaired glucose control: actual or potential Potential for alteratiion in Nutrition/Potential for imbalanced nutrition Goals: Patient/caregiver agrees to and verbalizes understanding of need to obtain nutritional consultation Date Initiated: 09/26/2020 T arget Resolution Date: 10/26/2020 Goal Status: Active Patient/caregiver agrees to and verbalizes understanding of need to use nutritional supplements and/or vitamins as prescribed Date Initiated: 09/26/2020 T arget Resolution Date: 10/25/2020 Goal Status: Active Patient/caregiver verbalizes understanding of need to maintain therapeutic glucose control per primary care physician Date Initiated: 09/26/2020 Date Inactivated: 10/10/2020 Target Resolution Date: 10/12/2020 Goal Status: Met Interventions: Assess HgA1c results as ordered upon admission and as needed  Assess patient nutrition upon admission and as needed per policy Provide education on elevated blood sugars and impact on wound healing Provide education on nutrition Treatment Activities: Education provided on Nutrition : 09/26/2020 Notes: Wound/Skin Impairment Nursing Diagnoses: Impaired tissue integrity Knowledge deficit related to ulceration/compromised skin integrity Goals: Patient will have [name] decrease in wound volume by X% from date: (specify in notes) Date Initiated: 09/26/2020 Target Resolution Date: 10/26/2020 Goal Status: Active Patient/caregiver will verbalize understanding of skin care regimen Date Initiated: 09/26/2020 Target Resolution Date: 10/26/2020 Goal Status: Active Ulcer/skin breakdown will have [name] volume reduction of 30% by week 4 Date Initiated: 09/26/2020 Target Resolution Date: 10/26/2020 Goal Status: Active Interventions: Assess patient/caregiver ability to obtain necessary supplies Assess patient/caregiver ability to perform ulcer/skin care regimen upon admission and as needed Assess ulceration(s) every visit Provide education on ulcer  and skin care Notes: Electronic Signature(s) Signed: 10/17/2020 7:00:57 PM By: Levan Hurst RN, BSN Entered By: Levan Hurst on 10/17/2020 09:55:51 -------------------------------------------------------------------------------- Pain Assessment Details Patient Name: Date of Service: [name], [name] 10/17/2020 9:45 [name] M Medical Record Number: [id number] Patient Account Number: [id number] Date of Birth/Sex: Treating RN: 1951-04-24 (69 y.o. Erie Noe Primary Care Perpetua Elling: Grier Mitts Other Clinician: Referring Madalyn Legner: Treating Zita Ozimek/Extender: Natalia Leatherwood Weeks in Treatment: 3 Active Problems Location of Pain Severity and Description of Pain Patient Has Paino No Site Locations Rate the pain. Rate the pain. Current Pain Level: 0 Pain Management and Medication Current Pain Management: Electronic Signature(s) Signed: 10/17/2020 5:16:00 PM By: Rhae Hammock RN Entered By: Rhae Hammock on 10/17/2020 09:40:36 -------------------------------------------------------------------------------- Patient/Caregiver Education Details Patient Name: Date of Service: [name], [name] 12/22/2021andnbsp9:45 [place] Record Number: [id number] Patient Account Number: [id number] Date of Birth/Gender: Treating RN: 28-Sep-1951 (69 y.o. [name] Primary Care Physician: Grier Mitts Other Clinician: Referring Physician: Treating Physician/Extender: Katherine Basset in Treatment: 3 Education Assessment Education Provided To: Patient Education Topics Provided Wound/Skin Impairment: Methods: Explain/Verbal Responses: State content correctly Electronic Signature(s) Signed: 10/17/2020 7:00:57 PM By: Levan Hurst RN, BSN Entered By: Levan Hurst on 10/17/2020 09:56:02 -------------------------------------------------------------------------------- Wound Assessment Details Patient Name: Date of  Service: [name], [name] 10/17/2020 9:45 [name] M Medical Record Number: [id number] Patient Account Number: [id number] Date of Birth/Sex: Treating RN: 1951-04-21 (69 y.o. Burnadette Pop, Lauren Primary Care Trace Wirick: Grier Mitts Other Clinician: Referring Brook Geraci: Treating Ivie Savitt/Extender: Natalia Leatherwood Weeks in Treatment: 3 Wound Status Wound Number: 2 Primary Etiology: Diabetic Wound/Ulcer of the Lower Extremity Wound Location: Right, Plantar T Fifth oe Wound Status: Open Wounding Event: Blister Comorbid Hypertension, Type II Diabetes, Osteoarthritis, History: Neuropathy Date Acquired: 09/10/2020 Weeks Of Treatment: 3 Clustered Wound: No Wound Measurements Length: (cm) 0.4 Width: (cm) 0.6 Depth: (cm) 0.1 Area: (cm) 0.188 Volume: (cm) 0.019 % Reduction in Area: 90.7% % Reduction in Volume: 90.5% Epithelialization: Medium (34-66%) Tunneling: No Undermining: No Wound Description Classification: Grade 2 Wound Margin: Flat and Intact Exudate Amount: Medium Exudate Type: Serosanguineous Exudate Color: red, brown Foul Odor After Cleansing: No Slough/Fibrino Yes Wound Bed Granulation Amount: Medium (34-66%) Exposed Structure Granulation Quality: Pink Fascia Exposed: No Necrotic Amount: Medium (34-66%) Fat Layer (Subcutaneous Tissue) Exposed: Yes Necrotic Quality: Adherent Slough Tendon Exposed: No Muscle Exposed: No Joint Exposed: No Bone Exposed: No Treatment Notes Wound #2 (Toe Fifth) Wound Laterality: Plantar, Right Cleanser Peri-Wound Care Sween Lotion (Moisturizing lotion) Discharge Instruction: Apply moisturizing lotion to feet with dressing changes Topical Primary Dressing KerraCel Ag Gelling Fiber Dressing, 2x2  in (silver alginate) Discharge Instruction: Apply silver alginate to wound bed as instructed Secondary Dressing Woven Gauze Sponges 2x2 in Discharge Instruction: Apply over primary dressing as directed. Secured  With Conforming Stretch Gauze Bandage, Sterile 2x75 (in/in) Discharge Instruction: Secure with stretch gauze as directed. Paper Tape, 1x10 (in/yd) Discharge Instruction: Secure dressing with tape as directed. Compression Wrap Compression Stockings Add-Ons Electronic Signature(s) Signed: 10/17/2020 5:16:00 PM By: Rhae Hammock RN Entered By: Rhae Hammock on 10/17/2020 09:44:48 -------------------------------------------------------------------------------- Wound Assessment Details Patient Name: Date of Service: [name], [name] 10/17/2020 9:45 [name] M Medical Record Number: [id number] Patient Account Number: [id number] Date of Birth/Sex: Treating RN: [birth year] (69 y.o. Burnadette Pop, Lauren Primary Care Tahisha Hakim: Grier Mitts Other Clinician: Referring Daana Petrasek: Treating Carnesha Maravilla/Extender: Natalia Leatherwood Weeks in Treatment: 3 Wound Status Wound Number: 3 Primary Etiology: Diabetic Wound/Ulcer of the Lower Extremity Wound Location: Right, Plantar T Second oe Wound Status: Open Wounding Event: Blister Comorbid Hypertension, Type II Diabetes, Osteoarthritis, History: Neuropathy Date Acquired: 09/10/2020 Weeks Of Treatment: 3 Clustered Wound: No Wound Measurements Length: (cm) 0.2 Width: (cm) 0.2 Depth: (cm) 0.1 Area: (cm) 0.031 Volume: (cm) 0.003 % Reduction in Area: 98.1% % Reduction in Volume: 98.2% Epithelialization: Large (67-100%) Tunneling: No Undermining: No Wound Description Classification: Grade 2 Wound Margin: Flat and Intact Exudate Amount: Medium Exudate Type: Serosanguineous Exudate Color: red, brown Foul Odor After Cleansing: No Slough/Fibrino No Wound Bed Granulation Amount: Large (67-100%) Exposed Structure Granulation Quality: Pink Fascia Exposed: No Necrotic Amount: None Present (0%) Fat Layer (Subcutaneous Tissue) Exposed: Yes Tendon Exposed: No Muscle Exposed: No Joint Exposed: No Bone Exposed: No Treatment  Notes Wound #3 (Toe Second) Wound Laterality: Plantar, Right Cleanser Peri-Wound Care Sween Lotion (Moisturizing lotion) Discharge Instruction: Apply moisturizing lotion to feet with dressing changes Topical Primary Dressing KerraCel Ag Gelling Fiber Dressing, 2x2 in (silver alginate) Discharge Instruction: Apply silver alginate to wound bed as instructed Secondary Dressing Woven Gauze Sponges 2x2 in Discharge Instruction: Apply over primary dressing as directed. Secured With Conforming Stretch Gauze Bandage, Sterile 2x75 (in/in) Discharge Instruction: Secure with stretch gauze as directed. Paper Tape, 1x10 (in/yd) Discharge Instruction: Secure dressing with tape as directed. Compression Wrap Compression Stockings Add-Ons Electronic Signature(s) Signed: 10/17/2020 5:16:00 PM By: Rhae Hammock RN Entered By: Rhae Hammock on 10/17/2020 09:43:31 -------------------------------------------------------------------------------- Wound Assessment Details Patient Name: Date of Service: [name], [name] 10/17/2020 9:45 [name] M Medical Record Number: [id number] Patient Account Number: [id number] Date of Birth/Sex: Treating RN: December 04, 1950 (69 y.o. Burnadette Pop, Lauren Primary Care Vienne Corcoran: Grier Mitts Other Clinician: Referring Lanesha Azzaro: Treating Ah Bott/Extender: Natalia Leatherwood Weeks in Treatment: 3 Wound Status Wound Number: 4 Primary Etiology: Diabetic Wound/Ulcer of the Lower Extremity Wound Location: Right, Plantar T Great oe Wound Status: Open Wounding Event: Blister Comorbid Hypertension, Type II Diabetes, Osteoarthritis, History: Neuropathy Date Acquired: 09/10/2020 Weeks Of Treatment: 3 Clustered Wound: No Wound Measurements Length: (cm) 1.7 Width: (cm) 1.4 Depth: (cm) 0.1 Area: (cm) 1.869 Volume: (cm) 0.187 % Reduction in Area: -366.1% % Reduction in Volume: -367.5% Epithelialization: Small (1-33%) Tunneling: No Undermining:  No Wound Description Classification: Grade 2 Wound Margin: Flat and Intact Exudate Amount: Medium Exudate Type: Serosanguineous Exudate Color: red, brown Foul Odor After Cleansing: No Slough/Fibrino No Wound Bed Granulation Amount: Large (67-100%) Exposed Structure Granulation Quality: Pink Fascia Exposed: No Necrotic Amount: None Present (0%) Fat Layer (Subcutaneous Tissue) Exposed: Yes Tendon Exposed: No Muscle Exposed: No Joint Exposed: No Bone Exposed: No Treatment Notes Wound #4 (Toe Great) Wound  Laterality: Plantar, Right Cleanser Peri-Wound Care Sween Lotion (Moisturizing lotion) Discharge Instruction: Apply moisturizing lotion to feet with dressing changes Topical Primary Dressing KerraCel Ag Gelling Fiber Dressing, 2x2 in (silver alginate) Discharge Instruction: Apply silver alginate to wound bed as instructed Secondary Dressing Woven Gauze Sponges 2x2 in Discharge Instruction: Apply over primary dressing as directed. Secured With Conforming Stretch Gauze Bandage, Sterile 2x75 (in/in) Discharge Instruction: Secure with stretch gauze as directed. Paper Tape, 1x10 (in/yd) Discharge Instruction: Secure dressing with tape as directed. Compression Wrap Compression Stockings Add-Ons Electronic Signature(s) Signed: 10/17/2020 5:16:00 PM By: Rhae Hammock RN Entered By: Rhae Hammock on 10/17/2020 09:44:13 -------------------------------------------------------------------------------- Vitals Details Patient Name: Date of Service: [name], [name] 10/17/2020 9:45 [name] M Medical Record Number: [id number] Patient Account Number: [id number] Date of Birth/Sex: Treating RN: 1951/05/22 (69 y.o. Burnadette Pop, Lauren Primary Care Jamarria Real: Grier Mitts Other Clinician: Referring Shoshana Johal: Treating Mahesh Sizemore/Extender: Natalia Leatherwood Weeks in Treatment: 3 Vital Signs Time Taken: 09:39 Temperature (F): 98 Height (in): 66 Pulse (bpm):  72 Weight (lbs): 159 Respiratory Rate (breaths/min): 17 Body Mass Index (BMI): 25.7 Blood Pressure (mmHg): 125/70 Capillary Blood Glucose (mg/dl): 189 Reference Range: 80 - 120 mg / dl Electronic Signature(s) Signed: 10/17/2020 5:16:00 PM By: Rhae Hammock RN Entered By: Rhae Hammock on 10/17/2020 09:40:28

## 2020-10-23 ENCOUNTER — Other Ambulatory Visit: Payer: Self-pay

## 2020-10-24 ENCOUNTER — Ambulatory Visit (INDEPENDENT_AMBULATORY_CARE_PROVIDER_SITE_OTHER): Payer: Medicare HMO | Admitting: Family Medicine

## 2020-10-24 ENCOUNTER — Encounter: Payer: Self-pay | Admitting: Family Medicine

## 2020-10-24 VITALS — BP 138/77 | HR 84 | Temp 97.7°F | Wt 156.0 lb

## 2020-10-24 DIAGNOSIS — I10 Essential (primary) hypertension: Secondary | ICD-10-CM | POA: Diagnosis not present

## 2020-10-24 DIAGNOSIS — R238 Other skin changes: Secondary | ICD-10-CM

## 2020-10-24 DIAGNOSIS — E1142 Type 2 diabetes mellitus with diabetic polyneuropathy: Secondary | ICD-10-CM | POA: Diagnosis not present

## 2020-10-24 NOTE — Progress Notes (Signed)
Subjective:    Patient ID: [name], male    DOB: 10/03/1951, 69 y.o.   MRN: [id number]  No chief complaint on file.   HPI Patient was seen today for f/u.  Pt got new blood pressure cuff, readings 101-120/60-70s per memory.  Patient states BP was elevated this a.m. in office 2/2 his GF arguing in the car on the way to the visit.  BP at home this morning 107/78.  Taking Norvasc 10 mg, losartan 100 mg, diltiazem 300 mg, HCTZ 12.5 mg.  FSBS 145-200s.  185 this AM.  Patient had pork and beans and wings last night for dinner.  Patient states when his blood sugar is normal he feels bad, but when it is elevated he feels better.  Currently taking Novolin 70/30 20 units in a.m. and 22 units in p.m.  Last hemoglobin A1c 6.6%.  Patient has follow-up with urology early next year.  Met with sleep study provider, will hold off on sleep study at this time.  Trying to shift sleep time. Seen by wound care for blisters on b/l feet caused by shoes rubbing.    Past Medical History:  Diagnosis Date  . Diabetes mellitus without complication ([place])   . Insomnia     No Known Allergies  ROS General: Denies fever, chills, night sweats, changes in weight, changes in appetite HEENT: Denies headaches, ear pain, changes in vision, rhinorrhea, sore throat CV: Denies CP, palpitations, SOB, orthopnea Pulm: Denies SOB, cough, wheezing GI: Denies abdominal pain, nausea, vomiting, diarrhea, constipation GU: Denies dysuria, hematuria, frequency Msk: Denies muscle cramps, joint pains Neuro: Denies weakness, numbness, tingling Skin: Denies rashes, bruising Psych: Denies depression, anxiety, hallucinations      Objective:    Blood pressure 138/77, pulse 84, temperature 97.7 F (36.5 C), temperature source Oral, weight 156 lb (70.8 kg), SpO2 97 %.   Gen. Pleasant, well-nourished, in no distress, normal affect   HEENT: [place]/AT, face symmetric, conjunctiva clear, no scleral icterus, PERRLA, EOMI, nares patent without  drainage Lungs: no accessory muscle use, CTAB, no wheezes or rales Cardiovascular: RRR, no m/r/g, no peripheral edema Musculoskeletal: No deformities, no cyanosis or clubbing, normal tone Neuro:  A&Ox3, CN II-XII intact, normal gait Skin:  Warm, dry.  Bilateral feet with healing skin, no erythema, drainage, or new lesions.  Calluses on bilateral feet and hands.   Wt Readings from Last 3 Encounters:  09/24/20 159 lb (72.1 kg)  09/07/20 162 lb 3.2 oz (73.6 kg)  08/24/20 158 lb 9.6 oz (71.9 kg)    Lab Results  Component Value Date   WBC 4.1 09/07/2020   HGB 12.2 (L) 09/07/2020   HCT 35.1 (L) 09/07/2020   PLT 196 09/07/2020   GLUCOSE 201 (H) 09/07/2020   CHOL 137 09/07/2020   TRIG 56 09/07/2020   HDL 53 09/07/2020   LDLCALC 70 09/07/2020   ALT 29 09/07/2020   AST 33 09/07/2020   NA 141 09/07/2020   K 4.2 09/07/2020   CL 106 09/07/2020   CREATININE 1.35 (H) 09/07/2020   BUN 22 09/07/2020   CO2 27 09/07/2020   TSH 1.01 09/07/2020   PSA 6.49 (H) 03/21/2020   HGBA1C 6.6 (H) 09/07/2020   MICROALBUR 13.6 (H) 11/10/2018    Assessment/Plan:  Essential hypertension -Improving -138/77 when rechecked by this provider -Continue current medications including HCTZ 12.5 mg, diltiazem 300 mg, Norvasc 10 mg, losartan 100 mg -Lifestyle modification strongly encouraged -Continue checking BP at home and keep a log to bring with you  to clinic  Type 2 diabetes mellitus with diabetic polyneuropathy, without long-term current use of insulin ([facility]) -Elevated -Discussed the importance of lifestyle modification and decreasing the amount of carbs eaten. -Reviewed portion sizes information about carbohydrates -Continue Novolin 70/30 20 units in a.m. and 22 units in p.m.  For continued elevation in a.m. sugar will increase evening dose of Novolin. -Patient encouraged to bring a log of blood sugar readings to clinic -Last hemoglobin A1c 6.6% on 09/07/2020. -Consider fructosamine level at next  OFV  Blisters of multiple sites -Healing.  Nearly resolved. -Continue follow-up with wound care and podiatry -Discussed diabetic shoes.  Previously ordered. -Discussed the importance of good glycemic control  F/u in 2 months, sooner if needed  Grier Mitts, MD

## 2020-10-24 NOTE — Patient Instructions (Signed)
Diabetes Mellitus and Skin Care Diabetes (diabetes mellitus) can lead to health problems over time, including skin problems. People with diabetes have a higher risk for many types of skin complications. This is because having poorly controlled blood sugar (glucose) levels can:  Damage nerves and blood vessels. This can result in decreased feeling in your legs and feet, which means you may not notice minor skin injuries that could lead to serious problems.  Reduce blood flow (circulation), which makes wounds heal more slowly and increases your risk of infection.  Cause areas of skin to become thick or discolored. What are some common skin conditions that affect people with diabetes? Diabetes often causes dry skin. It can also cause the skin on the feet to get thinner, break more easily, and heal more slowly. There are certain skin conditions that commonly affect people who have diabetes, such as:  Bacterial skin infections, such as styes, boils, infected hair follicles, and infections of the skin around the nails.  Fungal skin infections. These are most common in areas where skin rubs together, such as in the armpits or under the breasts.  Open sores, especially on the feet.  Tissue death (gangrene). This can happen on your feet if a serious infection does not heal properly. Gangrene can cause the need for a foot or leg to be surgically removed (amputated). Diabetes can also cause the skin to change. You may develop:  Dark, velvety markings on the skin that usually appear on the face, neck, armpits, inner thighs, and groin (acanthosis nigricans). This typically affects people of African-American and American-Indian descent.  Red, raised, scar-like tissue that may itch, feel painful, or develop into a wound (necrobiosis lipoidica).  Blisters on feet, toes, hands, or fingers.  Thickened, wax-like areas of skin that usually occur on the hands, forehead, or toes (digital sclerosis).  Brown or  red ring-shaped or half-ring-shaped patches of skin on the ears or fingers (disseminated granuloma).  Pea-shaped yellow bumps that may be itchy and surrounded by a red ring (eruptive xanthomatosis). This usually affects the arms, feet, buttocks, and the top of the hands.  Round, discolored patches of tan skin that do not hurt or itch (diabetic dermopathy). These may look like age spots. What do I need to know about itchy skin? It is common for people with diabetes to have itchy skin caused by dryness. Frequent high blood glucose levels can cause itchiness, and poor circulation and certain skin infections can make dry, itchy skin worse. If you have itchy skin that is red or covered in a rash, this could be a sign of an allergic reaction to a medicine. If you have a rash or if your skin is very itchy, contact your health care provider. You may need help to manage your diabetes better, or you may need treatment for an infection. How can I prevent skin breakdown? When you have diabetes and you get a badly infected ulcer or sore that does not heal, your skin can break down, especially if you have poor circulation or are on bed rest. To prevent skin breakdown:  Keep your skin clean and dry. Wash your skin often. Do not use hot water.  Do not use any products that contain nicotine or tobacco, such as cigarettes and e-cigarettes. Smoking affects the body's ability to heal. If you need help quitting, ask your health care provider.  Check your skin every day for cuts, bruises, redness, blisters, or sores, especially on your feet. Tell your health care provider  about any cuts, wounds, or sores you have, especially if they are healing slowly.  If you are on bed rest, try to change positions often. What else do I need to know about taking care of my skin?   To relieve dry skin and itching: ? Limit baths and showers to 5-10 minutes. ? Bathe with lukewarm water instead of hot water. ? Use mild soap and  gentle skin cleansers. Do not use soap that is perfumed or harsh or dries your skin. ? Put on lotion as soon as you finish bathing.  Make sure that your health care provider performs a visual foot exam at every medical visit.  Schedule a foot exam with your health care provider once every year. This exam includes an inspection of the structure and skin of your feet.  If you get a skin injury, such as a cut, blister, or sore, check the area every day for signs of infection. Check for: ? More redness, swelling, or pain. ? More fluid or blood. ? Warmth. ? Pus or a bad smell. Contact a health care provider if:  You develop a cut or sore, especially on your feet.  You develop signs of infection after a skin injury.  Your blood glucose level is higher than 240 mg/dL (13.3 mmol/L) for 2 days in a row.  You have itchy skin that develops redness or a rash.  You have discolored areas of skin.  You have areas where your skin is changing, such as thickening or appearing shiny. Summary  Diabetes (diabetes mellitus) can lead to health problems over time, including skin problems.  People with diabetes have a higher risk for many types of skin complications.  Check your skin every day for cuts, bruises, redness, blisters, or sores, especially on your feet.  Tell your health care provider about any cuts, wounds, or sores you have, especially if they are healing slowly. This information is not intended to replace advice given to you by your health care provider. Make sure you discuss any questions you have with your health care provider. Document Revised: 05/07/2017 Document Reviewed: 03/25/2016 Elsevier Patient Education  [employer].  Diabetic Retinopathy Diabetic retinopathy is a disease of the retina. The retina is a light-sensitive membrane at the back of the eye. Retinopathy is a complication of diabetes (diabetes mellitus) and a common cause of bad eyesight (visual impairment). It  can eventually cause blindness. Early detection and treatment of diabetic retinopathy is important in keeping your eyes healthy and preventing further damage to them. What are the causes? Diabetic retinopathy is caused by blood sugar (glucose) levels that are too high for an extended period of time. High blood glucose over an extended period of time can:  Damage small blood vessels in the retina, allowing blood to leak through the vessel walls.  Cause new, abnormal blood vessels to grow on the retina. This can scar the retina in the advanced stage of diabetic retinopathy. What increases the risk? You are more likely to develop this condition if:  You have had diabetes for a long time.  You have poorly controlled blood glucose.  You have high blood pressure. What are the signs or symptoms? In the early stages of diabetic retinopathy, there are often no symptoms. As the condition gets worse, symptoms may include:  Blurred vision. This is usually caused by swelling due to abnormal blood glucose levels. The blurriness may go away when blood glucose levels return to normal.  Moving specks or dark  spots (floaters) in your vision. These can be caused by a small amount of bleeding (hemorrhage) from retinal blood vessels.  Missing parts of your field of vision, such as vision at the sides of the eyes. This can be caused by larger retinal hemorrhages.  Difficulty reading.  Double vision.  Pain in one or both eyes.  Feeling pressure in one or both eyes.  Trouble seeing straight lines. Straight lines may not look straight.  Redness of the eyes that does not go away. How is this diagnosed? This condition may be diagnosed with an eye exam in which your eye care specialist puts drops in your eyes that enlarge (dilate) your pupils. This lets your health care provider examine your retina and check for changes in your retinal blood vessels. How is this treated? This condition may be treated  by:  Keeping your blood glucose and blood pressure within a target range.  Using a type of laser beam to seal your retinal blood vessels. This stops them from bleeding and decreases pressure in your eye.  Getting shots of medicine in the eye to reduce swelling of the center of the retina (macula). You may be given: ? Anti-VEGF medicine. This medicine can help slow vision loss, and may even improve vision. ? Steroid medicine. Follow these instructions at home:   Follow your diabetes management plan as directed by your health care provider. This may include exercising regularly and eating a healthy diet.  Keep your blood glucose level and your blood pressure in your target range, as directed by your health care provider.  Check your blood glucose as often as directed.  Take over the counter and prescription medicines only as told by your health care provider. This includes insulin and oral diabetes medicine.  Get your eyes checked at least once every year. An eye specialist can usually see diabetic retinopathy developing long before it starts to cause problems. In many cases, it can be treated to prevent complications from occurring.  Do not use any products that contain nicotine or tobacco, such as cigarettes and e-cigarettes. If you need help quitting, ask your health care provider.  Keep all follow-up visits as told by your health care provider. This is important. Contact a health care provider if:  You notice gradual blurring or other changes in your vision over time.  You notice that your glasses or contact lenses do not make things look as sharp as they once did.  You have trouble reading or seeing details at a distance with either eye.  You notice a change in your vision or notice that parts of your field of vision appear missing or hazy.  You suddenly see moving specks or dark spots in the field of vision of either eye. Get help right away if:  You have sudden pain or  pressure in one or both eyes.  You suddenly lose vision or a curtain or veil seems to come across your eyes.  You have a sudden burst of floaters in your vision. Summary  Diabetic retinopathy is a disease of the retina. The retina is a light-sensitive membrane at the back of the eye. Retinopathy is a complication of diabetes.  Get your eyes checked at least once every year. An eye specialist can usually see diabetic retinopathy developing long before it starts to cause problems. In many cases, it can be treated to prevent complications from occurring.  Keep your blood glucose and your blood pressure in target range. Follow your diabetes management  plan as directed by your health care provider.  Protect your eyes. Wear sunglasses and eye protection when needed. This information is not intended to replace advice given to you by your health care provider. Make sure you discuss any questions you have with your health care provider. Document Revised: 11/25/2017 Document Reviewed: 11/17/2016 Elsevier Patient Education  [place].  Hypertension, Adult High blood pressure (hypertension) is when the force of blood pumping through the arteries is too strong. The arteries are the blood vessels that carry blood from the heart throughout the body. Hypertension forces the heart to work harder to pump blood and may cause arteries to become narrow or stiff. Untreated or uncontrolled hypertension can cause a heart attack, heart failure, a stroke, kidney disease, and other problems. A blood pressure reading consists of a higher number over a lower number. Ideally, your blood pressure should be below 120/80. The first ("top") number is called the systolic pressure. It is a measure of the pressure in your arteries as your heart beats. The second ("bottom") number is called the diastolic pressure. It is a measure of the pressure in your arteries as the heart relaxes. What are the causes? The exact cause of  this condition is not known. There are some conditions that result in or are related to high blood pressure. What increases the risk? Some risk factors for high blood pressure are under your control. The following factors may make you more likely to develop this condition:  Smoking.  Having type 2 diabetes mellitus, high cholesterol, or both.  Not getting enough exercise or physical activity.  Being overweight.  Having too much fat, sugar, calories, or salt (sodium) in your diet.  Drinking too much alcohol. Some risk factors for high blood pressure may be difficult or impossible to change. Some of these factors include:  Having chronic kidney disease.  Having a family history of high blood pressure.  Age. Risk increases with age.  Race. You may be at higher risk if you are African American.  Gender. Men are at higher risk than women before age 51. After age 54, women are at higher risk than men.  Having obstructive sleep apnea.  Stress. What are the signs or symptoms? High blood pressure may not cause symptoms. Very high blood pressure (hypertensive crisis) may cause:  Headache.  Anxiety.  Shortness of breath.  Nosebleed.  Nausea and vomiting.  Vision changes.  Severe chest pain.  Seizures. How is this diagnosed? This condition is diagnosed by measuring your blood pressure while you are seated, with your arm resting on a flat surface, your legs uncrossed, and your feet flat on the floor. The cuff of the blood pressure monitor will be placed directly against the skin of your upper arm at the level of your heart. It should be measured at least twice using the same arm. Certain conditions can cause a difference in blood pressure between your right and left arms. Certain factors can cause blood pressure readings to be lower or higher than normal for a short period of time:  When your blood pressure is higher when you are in a health care provider's office than when you  are at home, this is called white coat hypertension. Most people with this condition do not need medicines.  When your blood pressure is higher at home than when you are in a health care provider's office, this is called masked hypertension. Most people with this condition may need medicines to control blood pressure.  If you have a high blood pressure reading during one visit or you have normal blood pressure with other risk factors, you may be asked to:  Return on a different day to have your blood pressure checked again.  Monitor your blood pressure at home for 1 week or longer. If you are diagnosed with hypertension, you may have other blood or imaging tests to help your health care provider understand your overall risk for other conditions. How is this treated? This condition is treated by making healthy lifestyle changes, such as eating healthy foods, exercising more, and reducing your alcohol intake. Your health care provider may prescribe medicine if lifestyle changes are not enough to get your blood pressure under control, and if:  Your systolic blood pressure is above 130.  Your diastolic blood pressure is above 80. Your personal target blood pressure may vary depending on your medical conditions, your age, and other factors. Follow these instructions at home: Eating and drinking   Eat a diet that is high in fiber and potassium, and low in sodium, added sugar, and fat. An example eating plan is called the DASH (Dietary Approaches to Stop Hypertension) diet. To eat this way: ? Eat plenty of fresh fruits and vegetables. Try to fill one half of your plate at each meal with fruits and vegetables. ? Eat whole grains, such as whole-wheat pasta, brown rice, or whole-grain bread. Fill about one fourth of your plate with whole grains. ? Eat or drink low-fat dairy products, such as skim milk or low-fat yogurt. ? Avoid fatty cuts of meat, processed or cured meats, and poultry with skin. Fill  about one fourth of your plate with lean proteins, such as fish, chicken without skin, beans, eggs, or tofu. ? Avoid pre-made and processed foods. These tend to be higher in sodium, added sugar, and fat.  Reduce your daily sodium intake. Most people with hypertension should eat less than 1,500 mg of sodium a day.  Do not drink alcohol if: ? Your health care provider tells you not to drink. ? You are pregnant, may be pregnant, or are planning to become pregnant.  If you drink alcohol: ? Limit how much you use to:  0-1 drink a day for women.  0-2 drinks a day for men. ? Be aware of how much alcohol is in your drink. In the [place] one drink equals one 12 oz bottle of beer (355 mL), one 5 oz glass of wine (148 mL), or one 1 oz glass of hard liquor (44 mL). Lifestyle   Work with your health care provider to maintain a healthy body weight or to lose weight. Ask what an ideal weight is for you.  Get at least 30 minutes of exercise most days of the week. Activities may include walking, swimming, or biking.  Include exercise to strengthen your muscles (resistance exercise), such as Pilates or lifting weights, as part of your weekly exercise routine. Try to do these types of exercises for 30 minutes at least 3 days a week.  Do not use any products that contain nicotine or tobacco, such as cigarettes, e-cigarettes, and chewing tobacco. If you need help quitting, ask your health care provider.  Monitor your blood pressure at home as told by your health care provider.  Keep all follow-up visits as told by your health care provider. This is important. Medicines  Take over-the-counter and prescription medicines only as told by your health care provider. Follow directions carefully. Blood pressure medicines must be taken  as prescribed.  Do not skip doses of blood pressure medicine. Doing this puts you at risk for problems and can make the medicine less effective.  Ask your health care provider  about side effects or reactions to medicines that you should watch for. Contact a health care provider if you:  Think you are having a reaction to a medicine you are taking.  Have headaches that keep coming back (recurring).  Feel dizzy.  Have swelling in your ankles.  Have trouble with your vision. Get help right away if you:  Develop a severe headache or confusion.  Have unusual weakness or numbness.  Feel faint.  Have severe pain in your chest or abdomen.  Vomit repeatedly.  Have trouble breathing. Summary  Hypertension is when the force of blood pumping through your arteries is too strong. If this condition is not controlled, it may put you at risk for serious complications.  Your personal target blood pressure may vary depending on your medical conditions, your age, and other factors. For most people, a normal blood pressure is less than 120/80.  Hypertension is treated with lifestyle changes, medicines, or a combination of both. Lifestyle changes include losing weight, eating a healthy, low-sodium diet, exercising more, and limiting alcohol. This information is not intended to replace advice given to you by your health care provider. Make sure you discuss any questions you have with your health care provider. Document Revised: 06/23/2018 Document Reviewed: 06/23/2018 Elsevier Patient Education  2020 [employer].

## 2020-11-07 ENCOUNTER — Other Ambulatory Visit: Payer: Self-pay

## 2020-11-07 ENCOUNTER — Encounter (HOSPITAL_BASED_OUTPATIENT_CLINIC_OR_DEPARTMENT_OTHER): Payer: Medicare HMO | Attending: Physician Assistant | Admitting: Physician Assistant

## 2020-11-07 DIAGNOSIS — N429 Disorder of prostate, unspecified: Secondary | ICD-10-CM | POA: Insufficient documentation

## 2020-11-07 DIAGNOSIS — L97512 Non-pressure chronic ulcer of other part of right foot with fat layer exposed: Secondary | ICD-10-CM | POA: Insufficient documentation

## 2020-11-07 DIAGNOSIS — L84 Corns and callosities: Secondary | ICD-10-CM | POA: Insufficient documentation

## 2020-11-07 DIAGNOSIS — I10 Essential (primary) hypertension: Secondary | ICD-10-CM | POA: Diagnosis not present

## 2020-11-07 DIAGNOSIS — E11621 Type 2 diabetes mellitus with foot ulcer: Secondary | ICD-10-CM | POA: Diagnosis not present

## 2020-11-07 DIAGNOSIS — L97522 Non-pressure chronic ulcer of other part of left foot with fat layer exposed: Secondary | ICD-10-CM | POA: Diagnosis not present

## 2020-11-07 NOTE — Progress Notes (Addendum)
JOSHUAN, GIAIMO ([id number]) Visit Report for 11/07/2020 Chief Complaint Document Details Patient Name: Date of Service: [name] 11/07/2020 9:45 A M Medical Record Number: [id number] Patient Account Number: [id number] Date of Birth/Sex: Treating RN: September 29, 1951 (70 y.o. M) Primary Care Provider: Grier Mitts Other Clinician: Referring Provider: Treating Provider/Extender: Natalia Leatherwood Weeks in Treatment: 6 Information Obtained from: Patient Chief Complaint Bilateral T Ulcers oe Electronic Signature(s) Signed: 11/07/2020 10:04:26 AM By: Worthy Keeler PA-C Entered By: Worthy Keeler on 11/07/2020 10:04:26 -------------------------------------------------------------------------------- Debridement Details Patient Name: Date of Service: [name], [name] 11/07/2020 9:45 A M Medical Record Number: [id number] Patient Account Number: [id number] Date of Birth/Sex: Treating RN: 1951-09-12 (70 y.o. [name] Primary Care Provider: Grier Mitts Other Clinician: Referring Provider: Treating Provider/Extender: Natalia Leatherwood Weeks in Treatment: 6 Debridement Performed for Assessment: Wound #2 Right,Plantar T Fifth oe Performed By: Physician Worthy Keeler, PA Debridement Type: Debridement Severity of Tissue Pre Debridement: Fat layer exposed Level of Consciousness (Pre-procedure): Awake and Alert Pre-procedure Verification/Time Out Yes - 10:40 Taken: Start Time: 10:41 Pain Control: Lidocaine 5% topical ointment T Area Debrided (L x W): otal 1 (cm) x 1 (cm) = 1 (cm) Tissue and other material debrided: Viable, Non-Viable, Callus, Skin: Epidermis Level: Skin/Epidermis Debridement Description: Selective/Open Wound Instrument: Curette Bleeding: None End Time: 10:46 Procedural Pain: 0 Post Procedural Pain: 0 Response to Treatment: Procedure was tolerated well Level of Consciousness (Post- Awake and Alert procedure): Post  Debridement Measurements of Total Wound Length: (cm) 0.3 Width: (cm) 0.3 Depth: (cm) 0.1 Volume: (cm) 0.007 Character of Wound/Ulcer Post Debridement: Improved Severity of Tissue Post Debridement: Fat layer exposed Post Procedure Diagnosis Same as Pre-procedure Electronic Signature(s) Signed: 11/07/2020 5:15:08 PM By: Worthy Keeler PA-C Signed: 11/07/2020 5:15:42 PM By: Baruch Gouty RN, BSN Entered By: Baruch Gouty on 11/07/2020 10:46:52 -------------------------------------------------------------------------------- Debridement Details Patient Name: Date of Service: [name], [name] 11/07/2020 9:45 A M Medical Record Number: [id number] Patient Account Number: [id number] Date of Birth/Sex: Treating RN: 1951/10/04 (70 y.o. [name] Primary Care Provider: Grier Mitts Other Clinician: Referring Provider: Treating Provider/Extender: Natalia Leatherwood Weeks in Treatment: 6 Debridement Performed for Assessment: Wound #4 Right,Plantar T Great oe Performed By: Physician Worthy Keeler, PA Debridement Type: Debridement Severity of Tissue Pre Debridement: Fat layer exposed Level of Consciousness (Pre-procedure): Awake and Alert Pre-procedure Verification/Time Out Yes - 10:40 Taken: Start Time: 10:41 Pain Control: Lidocaine 5% topical ointment T Area Debrided (L x W): otal 2 (cm) x 1.5 (cm) = 3 (cm) Tissue and other material debrided: Non-Viable, Callus, Skin: Epidermis Level: Skin/Epidermis Debridement Description: Selective/Open Wound Instrument: Curette Bleeding: None End Time: 10:46 Procedural Pain: 0 Post Procedural Pain: 0 Response to Treatment: Procedure was tolerated well Level of Consciousness (Post- Awake and Alert procedure): Post Debridement Measurements of Total Wound Length: (cm) 0.9 Width: (cm) 0.5 Depth: (cm) 0.1 Volume: (cm) 0.035 Character of Wound/Ulcer Post Debridement: Improved Severity of Tissue Post Debridement:  Fat layer exposed Post Procedure Diagnosis Same as Pre-procedure Electronic Signature(s) Signed: 11/07/2020 5:15:08 PM By: Worthy Keeler PA-C Signed: 11/07/2020 5:15:42 PM By: Baruch Gouty RN, BSN Entered By: Baruch Gouty on 11/07/2020 10:50:42 -------------------------------------------------------------------------------- HPI Details Patient Name: Date of Service: [name], [name] 11/07/2020 9:45 A M Medical Record Number: [id number] Patient Account Number: [id number] Date of Birth/Sex: Treating RN: Aug 13, 1951 (70 y.o. M) Primary Care Provider: Grier Mitts Other Clinician: Referring Provider: Treating Provider/Extender: Worthy Keeler  Grier Mitts Weeks in Treatment: 6 History of Present Illness HPI Description: 09/26/2020 on evaluation today patient appears to be doing somewhat poorly in regard to his bilateral feet at this point due to issues that he is been having that developed about 2 weeks ago. He was walking home which was about a mile and states that he first noted the areas on the right foot beginning. These were blisters that develop. The left started Thanksgiving day when he was up standing more cooking. He did see his primary care provider who referred him to podiatry. He saw Dr. Posey Pronto and Dr. Posey Pronto recommended Betadine moistened gauze dressings to the wounds and to follow-up in 2 weeks. With that being said the patient subsequently was concerned about infection 2 to 3 days later as was his family member who is with him today. I believe this was a sister. With that being said subsequently he ended up going to urgent care at that point he was given Diflucan and Keflex he is done with both at this time. He is could be canceling the appointment with Dr. Posey Pronto they tell me at this point. His most recent hemoglobin A1c was 6.6 on November 12. His ABIs were 1.2 on the right and 1.03 on the left and appear to be doing excellent. With that being said he does not have  diabetic shoes and I feel like the shoes were likely the culprit for what led to this issue currently. There does not appear to be any signs of systemic infection nor local infection at this point which is great news. The patient does have a history of hypertension as well as potentially a recurrence of prostate cancer he is being followed by the cancer center for that at this point 10/03/2020 upon evaluation today patient appears to be doing better in regard to his toe ulcers. He has been tolerating the dressing changes without complication. Fortunately there is no signs of active infection at this time. The right great toe and fifth toe are the 2 worst out of everything here he has a couple that have healed on the left foot there is really nothing remaining open though he has several areas of callus on the left foot in fact 3 on the third fourth and fifth toes. That is going need to be removed today as well. 10/10/2020 upon evaluation today patient appears to be doing well in regard to his wounds. He still has wounds open on the first, second, and fifth toes of the right foot. Left foot is completely healed and the right fourth toe is also completely healed today. Overall very pleased with how things seem to be progressing. 10/17/2020 on evaluation today patient appears to be doing much better in regard to the right first, second, and fifth toes. He is making excellent progress and to be honest I am extremely pleased with where things stand today. Fortunately there is no signs of active infection at this time. No fevers, chills, nausea, vomiting, or diarrhea. 11/07/2020 upon evaluation today patient appears to actually be doing excellent in regard to his toe ulcers. The fifth and first toe on the right foot are what is left at this point. There does not appear to be signs of infection currently which is great news. With that being said I did perform some sharp debridement today to remove some of the  callus around the edges of the wound Electronic Signature(s) Signed: 11/07/2020 11:05:20 AM By: Worthy Keeler PA-C Entered By: Melburn Hake,  Huriel Matt on 11/07/2020 11:05:20 -------------------------------------------------------------------------------- Physical Exam Details Patient Name: Date of Service: [name] 11/07/2020 9:45 A M Medical Record Number: [id number] Patient Account Number: [id number] Date of Birth/Sex: Treating RN: 11-16-1950 (71 y.o. M) Primary Care Provider: Grier Mitts Other Clinician: Referring Provider: Treating Provider/Extender: Natalia Leatherwood Weeks in Treatment: 6 Constitutional Well-nourished and well-hydrated in no acute distress. Respiratory normal breathing without difficulty. Psychiatric this patient is able to make decisions and demonstrates good insight into disease process. Alert and Oriented x 3. pleasant and cooperative. Notes When I removed the callus around the fifth toe there actually appears to be potentially an issue where this has completely healed and there is really little to nothing still remaining open at this point. In general I feel like he is doing quite well and I am extremely pleased in that regard. Subsequently in regard to the first toe there was callus around the edges of the wound I did perform sharp debridement to clear this away postdebridement this appears to be doing much better. In general I think he is doing excellent. Electronic Signature(s) Signed: 11/07/2020 11:05:51 AM By: Worthy Keeler PA-C Entered By: Worthy Keeler on 11/07/2020 11:05:51 -------------------------------------------------------------------------------- Physician Orders Details Patient Name: Date of Service: [name], [name] 11/07/2020 9:45 A M Medical Record Number: [id number] Patient Account Number: [id number] Date of Birth/Sex: Treating RN: January 11, 1951 (70 y.o. [name] Primary Care Provider: Grier Mitts Other  Clinician: Referring Provider: Treating Provider/Extender: Natalia Leatherwood Weeks in Treatment: 6 Verbal / Phone Orders: No Diagnosis Coding ICD-10 Coding Code Description E11.621 Type 2 diabetes mellitus with foot ulcer L97.522 Non-pressure chronic ulcer of other part of left foot with fat layer exposed L97.512 Non-pressure chronic ulcer of other part of right foot with fat layer exposed I10 Essential (primary) hypertension N42.9 Disorder of prostate, unspecified L84 Corns and callosities Follow-up Appointments Return Appointment in 1 week. Bathing/ Shower/ Hygiene May shower and wash wound with soap and water. Off-Loading Open toe surgical shoe to: - both feet Wound Treatment Wound #2 - T Fifth oe Wound Laterality: Plantar, Right Peri-Wound Care: Sween Lotion (Moisturizing lotion) Every Other Day/30 Days Discharge Instructions: Apply moisturizing lotion to feet with dressing changes Prim Dressing: KerraCel Ag Gelling Fiber Dressing, 2x2 in (silver alginate) Every Other Day/30 Days ary Discharge Instructions: Apply silver alginate to wound bed as instructed Secondary Dressing: Woven Gauze Sponges 2x2 in Every Other Day/30 Days Discharge Instructions: Apply over primary dressing as directed. Secured With: Child psychotherapist, Sterile 2x75 (in/in) Every Other Day/30 Days Discharge Instructions: Secure with stretch gauze as directed. Secured With: Paper Tape, 1x10 (in/yd) Every Other Day/30 Days Discharge Instructions: Secure dressing with tape as directed. Wound #4 - T Great oe Wound Laterality: Plantar, Right Peri-Wound Care: Sween Lotion (Moisturizing lotion) Every Other Day/30 Days Discharge Instructions: Apply moisturizing lotion to feet with dressing changes Prim Dressing: KerraCel Ag Gelling Fiber Dressing, 2x2 in (silver alginate) Every Other Day/30 Days ary Discharge Instructions: Apply silver alginate to wound bed as instructed Secondary  Dressing: Woven Gauze Sponges 2x2 in Every Other Day/30 Days Discharge Instructions: Apply over primary dressing as directed. Secured With: Child psychotherapist, Sterile 2x75 (in/in) Every Other Day/30 Days Discharge Instructions: Secure with stretch gauze as directed. Secured With: Paper Tape, 1x10 (in/yd) Every Other Day/30 Days Discharge Instructions: Secure dressing with tape as directed. Electronic Signature(s) Signed: 11/07/2020 5:15:08 PM By: Worthy Keeler PA-C Signed: 11/07/2020 5:15:42 PM By:  Aida Raider, BSN Signed: 11/07/2020 5:15:42 PM By: Baruch Gouty RN, BSN Entered By: Baruch Gouty on 11/07/2020 10:52:36 -------------------------------------------------------------------------------- Problem List Details Patient Name: Date of Service: [name], [name] 11/07/2020 9:45 A M Medical Record Number: [id number] Patient Account Number: [id number] Date of Birth/Sex: Treating RN: October 06, 1951 (70 y.o. M) Primary Care Provider: Grier Mitts Other Clinician: Referring Provider: Treating Provider/Extender: Natalia Leatherwood Weeks in Treatment: 6 Active Problems ICD-10 Encounter Code Description Active Date MDM Diagnosis E11.621 Type 2 diabetes mellitus with foot ulcer 09/26/2020 No Yes L97.522 Non-pressure chronic ulcer of other part of left foot with fat layer exposed 09/26/2020 No Yes L97.512 Non-pressure chronic ulcer of other part of right foot with fat layer exposed 09/26/2020 No Yes I10 Essential (primary) hypertension 09/26/2020 No Yes N42.9 Disorder of prostate, unspecified 09/26/2020 No Yes L84 Corns and callosities 10/03/2020 No Yes Inactive Problems Resolved Problems Electronic Signature(s) Signed: 11/07/2020 10:04:20 AM By: Worthy Keeler PA-C Entered By: Worthy Keeler on 11/07/2020 10:04:20 -------------------------------------------------------------------------------- Progress Note Details Patient Name: Date of  Service: [name], [name] 11/07/2020 9:45 A M Medical Record Number: [id number] Patient Account Number: [id number] Date of Birth/Sex: Treating RN: 05-07-1951 (70 y.o. M) Primary Care Provider: Grier Mitts Other Clinician: Referring Provider: Treating Provider/Extender: Natalia Leatherwood Weeks in Treatment: 6 Subjective Chief Complaint Information obtained from Patient Bilateral T Ulcers oe History of Present Illness (HPI) 09/26/2020 on evaluation today patient appears to be doing somewhat poorly in regard to his bilateral feet at this point due to issues that he is been having that developed about 2 weeks ago. He was walking home which was about a mile and states that he first noted the areas on the right foot beginning. These were blisters that develop. The left started Thanksgiving day when he was up standing more cooking. He did see his primary care provider who referred him to podiatry. He saw Dr. Posey Pronto and Dr. Posey Pronto recommended Betadine moistened gauze dressings to the wounds and to follow-up in 2 weeks. With that being said the patient subsequently was concerned about infection 2 to 3 days later as was his family member who is with him today. I believe this was a sister. With that being said subsequently he ended up going to urgent care at that point he was given Diflucan and Keflex he is done with both at this time. He is could be canceling the appointment with Dr. Posey Pronto they tell me at this point. His most recent hemoglobin A1c was 6.6 on November 12. His ABIs were 1.2 on the right and 1.03 on the left and appear to be doing excellent. With that being said he does not have diabetic shoes and I feel like the shoes were likely the culprit for what led to this issue currently. There does not appear to be any signs of systemic infection nor local infection at this point which is great news. The patient does have a history of hypertension as well as potentially a  recurrence of prostate cancer he is being followed by the cancer center for that at this point 10/03/2020 upon evaluation today patient appears to be doing better in regard to his toe ulcers. He has been tolerating the dressing changes without complication. Fortunately there is no signs of active infection at this time. The right great toe and fifth toe are the 2 worst out of everything here he has a couple that have healed on the left foot there is really  nothing remaining open though he has several areas of callus on the left foot in fact 3 on the third fourth and fifth toes. That is going need to be removed today as well. 10/10/2020 upon evaluation today patient appears to be doing well in regard to his wounds. He still has wounds open on the first, second, and fifth toes of the right foot. Left foot is completely healed and the right fourth toe is also completely healed today. Overall very pleased with how things seem to be progressing. 10/17/2020 on evaluation today patient appears to be doing much better in regard to the right first, second, and fifth toes. He is making excellent progress and to be honest I am extremely pleased with where things stand today. Fortunately there is no signs of active infection at this time. No fevers, chills, nausea, vomiting, or diarrhea. 11/07/2020 upon evaluation today patient appears to actually be doing excellent in regard to his toe ulcers. The fifth and first toe on the right foot are what is left at this point. There does not appear to be signs of infection currently which is great news. With that being said I did perform some sharp debridement today to remove some of the callus around the edges of the wound Objective Constitutional Well-nourished and well-hydrated in no acute distress. Vitals Time Taken: 9:48 AM, Height: 66 in, Weight: 159 lbs, BMI: 25.7, Temperature: 97.8 F, Pulse: 88 bpm, Respiratory Rate: 16 breaths/min, Blood Pressure: 192/87 mmHg,  Capillary Blood Glucose: 156 mg/dl. General Notes: glucose per pt report Respiratory normal breathing without difficulty. Psychiatric this patient is able to make decisions and demonstrates good insight into disease process. Alert and Oriented x 3. pleasant and cooperative. General Notes: When I removed the callus around the fifth toe there actually appears to be potentially an issue where this has completely healed and there is really little to nothing still remaining open at this point. In general I feel like he is doing quite well and I am extremely pleased in that regard. Subsequently in regard to the first toe there was callus around the edges of the wound I did perform sharp debridement to clear this away postdebridement this appears to be doing much better. In general I think he is doing excellent. Integumentary (Hair, Skin) Wound #2 status is Open. Original cause of wound was Blister. The wound is located on the Right,Plantar T Fifth. The wound measures 0.1cm length x 0.1cm oe width x 0.1cm depth; 0.008cm^2 area and 0.001cm^3 volume. There is Fat Layer (Subcutaneous Tissue) exposed. There is no tunneling or undermining noted. There is a small amount of serosanguineous drainage noted. The wound margin is thickened. There is large (67-100%) pink granulation within the wound bed. There is no necrotic tissue within the wound bed. Wound #3 status is Healed - Epithelialized. Original cause of wound was Blister. The wound is located on the Right,Plantar T Second. The wound measures oe 0cm length x 0cm width x 0cm depth; 0cm^2 area and 0cm^3 volume. There is no tunneling or undermining noted. There is a none present amount of drainage noted. The wound margin is flat and intact. There is no granulation within the wound bed. There is no necrotic tissue within the wound bed. Wound #4 status is Open. Original cause of wound was Blister. The wound is located on the [employer]. The wound  measures 0.9cm length x oe 0.5cm width x 0.1cm depth; 0.353cm^2 area and 0.035cm^3 volume. There is Fat Layer (Subcutaneous Tissue) exposed.  There is no tunneling or undermining noted. There is a medium amount of serosanguineous drainage noted. The wound margin is flat and intact. There is large (67-100%) pink granulation within the wound bed. There is no necrotic tissue within the wound bed. Assessment Active Problems ICD-10 Type 2 diabetes mellitus with foot ulcer Non-pressure chronic ulcer of other part of left foot with fat layer exposed Non-pressure chronic ulcer of other part of right foot with fat layer exposed Essential (primary) hypertension Disorder of prostate, unspecified Corns and callosities Procedures Wound #2 Pre-procedure diagnosis of Wound #2 is a Diabetic Wound/Ulcer of the Lower Extremity located on the Right,Plantar T Fifth .Severity of Tissue Pre oe Debridement is: Fat layer exposed. There was a Selective/Open Wound Skin/Epidermis Debridement with a total area of 1 sq cm performed by Worthy Keeler, PA. With the following instrument(s): Curette to remove Viable and Non-Viable tissue/material. Material removed includes Callus and Skin: Epidermis and after achieving pain control using Lidocaine 5% topical ointment. No specimens were taken. A time out was conducted at 10:40, prior to the start of the procedure. There was no bleeding. The procedure was tolerated well with a pain level of 0 throughout and a pain level of 0 following the procedure. Post Debridement Measurements: 0.3cm length x 0.3cm width x 0.1cm depth; 0.007cm^3 volume. Character of Wound/Ulcer Post Debridement is improved. Severity of Tissue Post Debridement is: Fat layer exposed. Post procedure Diagnosis Wound #2: Same as Pre-Procedure Wound #4 Pre-procedure diagnosis of Wound #4 is a Diabetic Wound/Ulcer of the Lower Extremity located on the Right,Plantar T Great .Severity of Tissue Pre oe Debridement  is: Fat layer exposed. There was a Selective/Open Wound Skin/Epidermis Debridement with a total area of 3 sq cm performed by Worthy Keeler, PA. With the following instrument(s): Curette to remove Non-Viable tissue/material. Material removed includes Callus and Skin: Epidermis and after achieving pain control using Lidocaine 5% topical ointment. No specimens were taken. A time out was conducted at 10:40, prior to the start of the procedure. There was no bleeding. The procedure was tolerated well with a pain level of 0 throughout and a pain level of 0 following the procedure. Post Debridement Measurements: 0.9cm length x 0.5cm width x 0.1cm depth; 0.035cm^3 volume. Character of Wound/Ulcer Post Debridement is improved. Severity of Tissue Post Debridement is: Fat layer exposed. Post procedure Diagnosis Wound #4: Same as Pre-Procedure Plan Follow-up Appointments: Return Appointment in 1 week. Bathing/ Shower/ Hygiene: May shower and wash wound with soap and water. Off-Loading: Open toe surgical shoe to: - both feet WOUND #2: - T Fifth Wound Laterality: Plantar, Right oe Peri-Wound Care: Sween Lotion (Moisturizing lotion) Every Other Day/30 Days Discharge Instructions: Apply moisturizing lotion to feet with dressing changes Prim Dressing: KerraCel Ag Gelling Fiber Dressing, 2x2 in (silver alginate) Every Other Day/30 Days ary Discharge Instructions: Apply silver alginate to wound bed as instructed Secondary Dressing: Woven Gauze Sponges 2x2 in Every Other Day/30 Days Discharge Instructions: Apply over primary dressing as directed. Secured With: Child psychotherapist, Sterile 2x75 (in/in) Every Other Day/30 Days Discharge Instructions: Secure with stretch gauze as directed. Secured With: Paper T ape, 1x10 (in/yd) Every Other Day/30 Days Discharge Instructions: Secure dressing with tape as directed. WOUND #4: - T Great Wound Laterality: Plantar, Right oe Peri-Wound Care: Sween  Lotion (Moisturizing lotion) Every Other Day/30 Days Discharge Instructions: Apply moisturizing lotion to feet with dressing changes Prim Dressing: KerraCel Ag Gelling Fiber Dressing, 2x2 in (silver alginate) Every Other Day/30 Days ary  Discharge Instructions: Apply silver alginate to wound bed as instructed Secondary Dressing: Woven Gauze Sponges 2x2 in Every Other Day/30 Days Discharge Instructions: Apply over primary dressing as directed. Secured With: Child psychotherapist, Sterile 2x75 (in/in) Every Other Day/30 Days Discharge Instructions: Secure with stretch gauze as directed. Secured With: Paper T ape, 1x10 (in/yd) Every Other Day/30 Days Discharge Instructions: Secure dressing with tape as directed. 1. Would recommend currently that we continue with the wound care measures as before I feel like he is doing a great job as far as that is concerned we have been using the silver alginate which I think is doing great. 2. I am also going to recommend that he continue with appropriate offloading he has been doing a great job of that. And I think it shows and how well he is healing. We will see patient back for reevaluation in 1 week here in the clinic. If anything worsens or changes patient will contact our office for additional recommendations. Electronic Signature(s) Signed: 11/07/2020 11:06:31 AM By: Worthy Keeler PA-C Entered By: Worthy Keeler on 11/07/2020 11:06:30 -------------------------------------------------------------------------------- SuperBill Details Patient Name: Date of Service: [name], [name] 11/07/2020 Medical Record Number: [id number] Patient Account Number: [id number] Date of Birth/Sex: Treating RN: Nov 04, 1950 (70 y.o. Ulyses Amor, Vaughan Basta Primary Care Provider: Grier Mitts Other Clinician: Referring Provider: Treating Provider/Extender: Natalia Leatherwood Weeks in Treatment: 6 Diagnosis Coding ICD-10 Codes Code  Description [phone] Type 2 diabetes mellitus with foot ulcer L97.522 Non-pressure chronic ulcer of other part of left foot with fat layer exposed L97.512 Non-pressure chronic ulcer of other part of right foot with fat layer exposed I10 Essential (primary) hypertension N42.9 Disorder of prostate, unspecified L84 Corns and callosities Facility Procedures The patient participates with Medicare or their insurance follows the Medicare Facility Guidelines: CPT4 Code Description Modifier Quantity [id number] - DEBRIDE WOUND 1ST 20 SQ CM OR < 1 ICD-10 Diagnosis Description L97.512 Non-pressure chronic ulcer  of other part of right foot with fat layer exposed Physician Procedures : CPT4 Code Description Modifier [id number] - WC PHYS DEBR WO ANESTH 20 SQ CM ICD-10 Diagnosis Description L97.512 Non-pressure chronic ulcer of other part of right foot with fat layer exposed Quantity: 1 Electronic Signature(s) Signed: 11/07/2020 11:07:15 AM By: Worthy Keeler PA-C Entered By: Worthy Keeler on 11/07/2020 11:07:14

## 2020-11-08 NOTE — Progress Notes (Signed)
KOUA, DEEG ([id number]) Visit Report for 11/07/2020 Arrival Information Details Patient Name: Date of Service: Jeanmarie Plant 11/07/2020 9:45 A M Medical Record Number: [id number] Patient Account Number: [id number] Date of Birth/Sex: Treating RN: 1950/11/10 (70 y.o. [name] Primary Care Jakita Dutkiewicz: Grier Mitts Other Clinician: Referring Kodee Drury: Treating Khelani Kops/Extender: Natalia Leatherwood Weeks in Treatment: 6 Visit Information History Since Last Visit Added or deleted any medications: No Patient Arrived: Ambulatory Any new allergies or adverse reactions: No Arrival Time: 09:47 Had a fall or experienced change in No Accompanied By: spouse activities of daily living that may affect Transfer Assistance: None risk of falls: Patient Identification Verified: Yes Signs or symptoms of abuse/neglect since last visito No Secondary Verification Process Completed: Yes Hospitalized since last visit: No Patient Requires Transmission-Based Precautions: No Implantable device outside of the clinic excluding No Patient Has Alerts: No cellular tissue based products placed in the center since last visit: Has Dressing in Place as Prescribed: Yes Has Compression in Place as Prescribed: Yes Pain Present Now: No Electronic Signature(s) Signed: 11/08/2020 5:28:50 PM By: Levan Hurst RN, BSN Entered By: Levan Hurst on 11/07/2020 09:47:53 -------------------------------------------------------------------------------- Encounter Discharge Information Details Patient Name: Date of Service: [name], [name] 11/07/2020 9:45 A M Medical Record Number: [id number] Patient Account Number: [id number] Date of Birth/Sex: Treating RN: 02/09/1951 (70 y.o. Burnadette Pop, Lauren Primary Care Aletheia Tangredi: Grier Mitts Other Clinician: Referring Oceane Fosse: Treating Kaeo Jacome/Extender: Natalia Leatherwood Weeks in Treatment: 6 Encounter Discharge Information Items Post  Procedure Vitals Discharge Condition: Stable Temperature (F): 97.8 Ambulatory Status: Ambulatory Pulse (bpm): 88 Discharge Destination: Home Respiratory Rate (breaths/min): 18 Transportation: Private Auto Blood Pressure (mmHg): 192/87 Accompanied By: wife Schedule Follow-up Appointment: Yes Clinical Summary of Care: Patient Declined Electronic Signature(s) Signed: 11/07/2020 5:04:31 PM By: Rhae Hammock RN Entered By: Rhae Hammock on 11/07/2020 11:10:17 -------------------------------------------------------------------------------- Lower Extremity Assessment Details Patient Name: Date of Service: [name], [name] 11/07/2020 9:45 A M Medical Record Number: [id number] Patient Account Number: [id number] Date of Birth/Sex: Treating RN: 05/20/1951 (70 y.o. [name] Primary Care Derwin Reddy: Grier Mitts Other Clinician: Referring Maridee Slape: Treating Arkin Imran/Extender: Natalia Leatherwood Weeks in Treatment: 6 Edema Assessment Assessed: [Left: No] [Right: No] Edema: [Left: N] [Right: o] Calf Left: Right: Point of Measurement: 37 cm From Medial Instep 34 cm Ankle Left: Right: Point of Measurement: 12 cm From Medial Instep 21 cm Vascular Assessment Pulses: Dorsalis Pedis Palpable: [Right:Yes] Electronic Signature(s) Signed: 11/08/2020 5:28:50 PM By: Levan Hurst RN, BSN Entered By: Levan Hurst on 11/07/2020 09:50:22 -------------------------------------------------------------------------------- Multi-Disciplinary Care Plan Details Patient Name: Date of Service: [name], [name] 11/07/2020 9:45 A M Medical Record Number: [id number] Patient Account Number: [id number] Date of Birth/Sex: Treating RN: Sep 03, 1951 (70 y.o. [name] Primary Care Jeanclaude Wentworth: Grier Mitts Other Clinician: Referring Merrell Rettinger: Treating Kely Dohn/Extender: Natalia Leatherwood Weeks in Treatment: 6 Active Inactive Nutrition Nursing  Diagnoses: Impaired glucose control: actual or potential Potential for alteratiion in Nutrition/Potential for imbalanced nutrition Goals: Patient/caregiver agrees to and verbalizes understanding of need to obtain nutritional consultation Date Initiated: 09/26/2020 Date Inactivated: 11/07/2020 Target Resolution Date: 10/26/2020 Goal Status: Met Patient/caregiver agrees to and verbalizes understanding of need to use nutritional supplements and/or vitamins as prescribed Date Initiated: 09/26/2020 Date Inactivated: 11/07/2020 Target Resolution Date: 10/25/2020 Goal Status: Met Patient/caregiver verbalizes understanding of need to maintain therapeutic glucose control per primary care physician Date Initiated: 09/26/2020 Date Inactivated: 10/10/2020 Target Resolution Date: 10/12/2020 Goal Status: Met Patient/caregiver will  maintain therapeutic glucose control Date Initiated: 11/07/2020 Target Resolution Date: 12/05/2020 Goal Status: Active Interventions: Assess HgA1c results as ordered upon admission and as needed Assess patient nutrition upon admission and as needed per policy Provide education on elevated blood sugars and impact on wound healing Provide education on nutrition Treatment Activities: Education provided on Nutrition : 09/26/2020 Notes: Wound/Skin Impairment Nursing Diagnoses: Impaired tissue integrity Knowledge deficit related to ulceration/compromised skin integrity Goals: Patient will have a decrease in wound volume by X% from date: (specify in notes) Date Initiated: 09/26/2020 Date Inactivated: 11/07/2020 Target Resolution Date: 10/26/2020 Goal Status: Met Patient/caregiver will verbalize understanding of skin care regimen Date Initiated: 09/26/2020 Target Resolution Date: 12/05/2020 Goal Status: Active Ulcer/skin breakdown will have a volume reduction of 30% by week 4 Date Initiated: 09/26/2020 Date Inactivated: 11/07/2020 Target Resolution Date: 10/26/2020 Goal Status:  Met Ulcer/skin breakdown will have a volume reduction of 50% by week 8 Date Initiated: 11/07/2020 Target Resolution Date: 12/05/2020 Goal Status: Active Interventions: Assess patient/caregiver ability to obtain necessary supplies Assess patient/caregiver ability to perform ulcer/skin care regimen upon admission and as needed Assess ulceration(s) every visit Provide education on ulcer and skin care Notes: Electronic Signature(s) Signed: 11/07/2020 5:15:42 PM By: Baruch Gouty RN, BSN Entered By: Baruch Gouty on 11/07/2020 10:42:19 -------------------------------------------------------------------------------- Pain Assessment Details Patient Name: Date of Service: [name], [name] 11/07/2020 9:45 A M Medical Record Number: [id number] Patient Account Number: [id number] Date of Birth/Sex: Treating RN: 1951-05-15 (70 y.o. [name] Primary Care Destin Vinsant: Grier Mitts Other Clinician: Referring Kavitha Lansdale: Treating Habib Kise/Extender: Natalia Leatherwood Weeks in Treatment: 6 Active Problems Location of Pain Severity and Description of Pain Patient Has Paino No Site Locations Pain Management and Medication Current Pain Management: Electronic Signature(s) Signed: 11/08/2020 5:28:50 PM By: Levan Hurst RN, BSN Entered By: Levan Hurst on 11/07/2020 09:50:14 -------------------------------------------------------------------------------- Patient/Caregiver Education Details Patient Name: Date of Service: [name], [name] 1/12/2022andnbsp9:45 [place] Record Number: [id number] Patient Account Number: [id number] Date of Birth/Gender: Treating RN: 1951/10/12 (70 y.o. [name] Primary Care Physician: Grier Mitts Other Clinician: Referring Physician: Treating Physician/Extender: Katherine Basset in Treatment: 6 Education Assessment Education Provided To: Patient Education Topics Provided Elevated Blood Sugar/ Impact  on Healing: Methods: Explain/Verbal Responses: Reinforcements needed, State content correctly Wound/Skin Impairment: Methods: Explain/Verbal Responses: Reinforcements needed, State content correctly Electronic Signature(s) Signed: 11/07/2020 5:15:42 PM By: Baruch Gouty RN, BSN Entered By: Baruch Gouty on 11/07/2020 10:43:00 -------------------------------------------------------------------------------- Wound Assessment Details Patient Name: Date of Service: [name], [name] 11/07/2020 9:45 A M Medical Record Number: [id number] Patient Account Number: [id number] Date of Birth/Sex: Treating RN: [birth year] (70 y.o. [name] Primary Care Jeremey Bascom: Grier Mitts Other Clinician: Referring Tigran Haynie: Treating Talbot Monarch/Extender: Natalia Leatherwood Weeks in Treatment: 6 Wound Status Wound Number: 2 Primary Etiology: Diabetic Wound/Ulcer of the Lower Extremity Wound Location: Right, Plantar T Fifth oe Wound Status: Open Wounding Event: Blister Comorbid Hypertension, Type II Diabetes, Osteoarthritis, History: Neuropathy Date Acquired: 09/10/2020 Weeks Of Treatment: 6 Clustered Wound: No Wound Measurements Length: (cm) 0.1 Width: (cm) 0.1 Depth: (cm) 0.1 Area: (cm) 0.008 Volume: (cm) 0.001 % Reduction in Area: 99.6% % Reduction in Volume: 99.5% Epithelialization: Large (67-100%) Tunneling: No Undermining: No Wound Description Classification: Grade 2 Wound Margin: Thickened Exudate Amount: Small Exudate Type: Serosanguineous Exudate Color: red, brown Foul Odor After Cleansing: No Slough/Fibrino Yes Wound Bed Granulation Amount: Large (67-100%) Exposed Structure Granulation Quality: Pink Fascia Exposed: No Necrotic Amount: None Present (  0%) Fat Layer (Subcutaneous Tissue) Exposed: Yes Tendon Exposed: No Muscle Exposed: No Joint Exposed: No Bone Exposed: No Treatment Notes Wound #2 (Toe Fifth) Wound Laterality: Plantar,  Right Cleanser Peri-Wound Care Sween Lotion (Moisturizing lotion) Discharge Instruction: Apply moisturizing lotion to feet with dressing changes Topical Primary Dressing KerraCel Ag Gelling Fiber Dressing, 2x2 in (silver alginate) Discharge Instruction: Apply silver alginate to wound bed as instructed Secondary Dressing Woven Gauze Sponges 2x2 in Discharge Instruction: Apply over primary dressing as directed. Secured With Conforming Stretch Gauze Bandage, Sterile 2x75 (in/in) Discharge Instruction: Secure with stretch gauze as directed. Paper Tape, 1x10 (in/yd) Discharge Instruction: Secure dressing with tape as directed. Compression Wrap Compression Stockings Add-Ons Electronic Signature(s) Signed: 11/08/2020 5:28:50 PM By: Levan Hurst RN, BSN Entered By: Levan Hurst on 11/07/2020 09:53:11 -------------------------------------------------------------------------------- Wound Assessment Details Patient Name: Date of Service: [name], [name] 11/07/2020 9:45 A M Medical Record Number: [id number] Patient Account Number: [id number] Date of Birth/Sex: Treating RN: 1951/08/16 (70 y.o. [name] Primary Care Briget Shaheed: Grier Mitts Other Clinician: Referring Zander Ingham: Treating Olanrewaju Osborn/Extender: Natalia Leatherwood Weeks in Treatment: 6 Wound Status Wound Number: 3 Primary Etiology: Diabetic Wound/Ulcer of the Lower Extremity Wound Location: Right, Plantar T Second oe Wound Status: Healed - Epithelialized Wounding Event: Blister Comorbid Hypertension, Type II Diabetes, Osteoarthritis, History: Neuropathy Date Acquired: 09/10/2020 Weeks Of Treatment: 6 Clustered Wound: No Wound Measurements Length: (cm) Width: (cm) Depth: (cm) Area: (cm) Volume: (cm) 0 % Reduction in Area: 100% 0 % Reduction in Volume: 100% 0 Epithelialization: Large (67-100%) 0 Tunneling: No 0 Undermining: No Wound Description Classification: Grade 2 Wound Margin: Flat  and Intact Exudate Amount: None Present Foul Odor After Cleansing: No Slough/Fibrino No Wound Bed Granulation Amount: None Present (0%) Exposed Structure Necrotic Amount: None Present (0%) Fascia Exposed: No Fat Layer (Subcutaneous Tissue) Exposed: No Tendon Exposed: No Muscle Exposed: No Joint Exposed: No Bone Exposed: No Electronic Signature(s) Signed: 11/08/2020 5:28:50 PM By: Levan Hurst RN, BSN Entered By: Levan Hurst on 11/07/2020 09:53:29 -------------------------------------------------------------------------------- Wound Assessment Details Patient Name: Date of Service: [name], [name] 11/07/2020 9:45 A M Medical Record Number: [id number] Patient Account Number: [id number] Date of Birth/Sex: Treating RN: 05/15/1951 (70 y.o. [name] Primary Care Brityn Mastrogiovanni: Grier Mitts Other Clinician: Referring Myla Mauriello: Treating Margie Brink/Extender: Natalia Leatherwood Weeks in Treatment: 6 Wound Status Wound Number: 4 Primary Etiology: Diabetic Wound/Ulcer of the Lower Extremity Wound Location: Right, Plantar T Great oe Wound Status: Open Wounding Event: Blister Comorbid Hypertension, Type II Diabetes, Osteoarthritis, History: Neuropathy Date Acquired: 09/10/2020 Weeks Of Treatment: 6 Clustered Wound: No Wound Measurements Length: (cm) 0.9 Width: (cm) 0.5 Depth: (cm) 0.1 Area: (cm) 0.353 Volume: (cm) 0.035 % Reduction in Area: 12% % Reduction in Volume: 12.5% Epithelialization: Medium (34-66%) Tunneling: No Undermining: No Wound Description Classification: Grade 2 Wound Margin: Flat and Intact Exudate Amount: Medium Exudate Type: Serosanguineous Exudate Color: red, brown Foul Odor After Cleansing: No Slough/Fibrino No Wound Bed Granulation Amount: Large (67-100%) Exposed Structure Granulation Quality: Pink Fascia Exposed: No Necrotic Amount: None Present (0%) Fat Layer (Subcutaneous Tissue) Exposed: Yes Tendon Exposed: No Muscle  Exposed: No Joint Exposed: No Bone Exposed: No Treatment Notes Wound #4 (Toe Great) Wound Laterality: Plantar, Right Cleanser Peri-Wound Care Sween Lotion (Moisturizing lotion) Discharge Instruction: Apply moisturizing lotion to feet with dressing changes Topical Primary Dressing KerraCel Ag Gelling Fiber Dressing, 2x2 in (silver alginate) Discharge Instruction: Apply silver alginate to wound bed as instructed Secondary Dressing Woven Gauze Sponges  2x2 in Discharge Instruction: Apply over primary dressing as directed. Secured With Conforming Stretch Gauze Bandage, Sterile 2x75 (in/in) Discharge Instruction: Secure with stretch gauze as directed. Paper Tape, 1x10 (in/yd) Discharge Instruction: Secure dressing with tape as directed. Compression Wrap Compression Stockings Add-Ons Electronic Signature(s) Signed: 11/08/2020 5:28:50 PM By: Levan Hurst RN, BSN Entered By: Levan Hurst on 11/07/2020 09:53:49 -------------------------------------------------------------------------------- Vitals Details Patient Name: Date of Service: [name], [name] 11/07/2020 9:45 A M Medical Record Number: [id number] Patient Account Number: [id number] Date of Birth/Sex: Treating RN: Feb 08, 1951 (70 y.o. [name] Primary Care Wyoma Genson: Grier Mitts Other Clinician: Referring Gerod Caligiuri: Treating Isel Skufca/Extender: Natalia Leatherwood Weeks in Treatment: 6 Vital Signs Time Taken: 09:48 Temperature (F): 97.8 Height (in): 66 Pulse (bpm): 88 Weight (lbs): 159 Respiratory Rate (breaths/min): 16 Body Mass Index (BMI): 25.7 Blood Pressure (mmHg): 192/87 Capillary Blood Glucose (mg/dl): 156 Reference Range: 80 - 120 mg / dl Notes glucose per pt report Electronic Signature(s) Signed: 11/08/2020 5:28:50 PM By: Levan Hurst RN, BSN Entered By: Levan Hurst on 11/07/2020 09:48:54

## 2020-11-13 ENCOUNTER — Ambulatory Visit
Admission: RE | Admit: 2020-11-13 | Discharge: 2020-11-13 | Disposition: A | Discharge status: Home / Self Care | Payer: Medicare HMO | Source: Ambulatory Visit | Attending: Radiation Oncology | Admitting: Radiation Oncology

## 2020-11-13 ENCOUNTER — Encounter: Payer: Self-pay | Admitting: Radiation Oncology

## 2020-11-13 ENCOUNTER — Other Ambulatory Visit: Payer: Self-pay

## 2020-11-13 VITALS — Ht 66.0 in | Wt 159.0 lb

## 2020-11-13 DIAGNOSIS — R972 Elevated prostate specific antigen [PSA]: Secondary | ICD-10-CM | POA: Diagnosis not present

## 2020-11-13 DIAGNOSIS — E119 Type 2 diabetes mellitus without complications: Secondary | ICD-10-CM | POA: Insufficient documentation

## 2020-11-13 DIAGNOSIS — C61 Malignant neoplasm of prostate: Secondary | ICD-10-CM | POA: Diagnosis not present

## 2020-11-13 DIAGNOSIS — Z794 Long term (current) use of insulin: Secondary | ICD-10-CM | POA: Diagnosis not present

## 2020-11-13 DIAGNOSIS — Z808 Family history of malignant neoplasm of other organs or systems: Secondary | ICD-10-CM | POA: Diagnosis not present

## 2020-11-13 DIAGNOSIS — Z803 Family history of malignant neoplasm of breast: Secondary | ICD-10-CM | POA: Diagnosis not present

## 2020-11-13 DIAGNOSIS — G4721 Circadian rhythm sleep disorder, delayed sleep phase type: Secondary | ICD-10-CM | POA: Insufficient documentation

## 2020-11-13 DIAGNOSIS — Z800 Family history of malignant neoplasm of digestive organs: Secondary | ICD-10-CM | POA: Insufficient documentation

## 2020-11-13 DIAGNOSIS — Z79899 Other long term (current) drug therapy: Secondary | ICD-10-CM | POA: Insufficient documentation

## 2020-11-13 HISTORY — DX: Malignant neoplasm of prostate: C61

## 2020-11-13 NOTE — Progress Notes (Signed)
Radiation Oncology         [phone] ________________________________  Initial Outpatient Consultation - Conducted via MyChart due to current COVID-19 concerns for limiting patient exposure  Name: [name] MRN: [id number]  Date: 11/13/2020  DOB: 1951-03-04  UU:[id number], [name] Adie, MD  Irine Seal, MD   REFERRING PHYSICIAN: Irine Seal, MD  DIAGNOSIS: 70 y.o. gentleman with Stage T1c adenocarcinoma of the prostate with Gleason score of 3+4, and PSA of 6.49.    ICD-10-CM   1. Prostate cancer ([place])  C61     HISTORY OF PRESENT ILLNESS: [name] is a 70 y.o. male with a diagnosis of prostate cancer. He was noted to have an elevated PSA of 6.49 by his primary care physician, Dr. Volanda Napoleon.  This was up from 4.99 in 01/2020 and 2.69 in 2017.  Accordingly, he was referred for evaluation in urology by Dr. Jeffie Pollock on 06/18/2020,  digital rectal examination was performed at that time revealing no nodules.  The patient proceeded to transrectal ultrasound with 12 biopsies of the prostate on 08/29/2020.  The prostate volume measured 17 cc.  Out of 12 core biopsies, 5 were positive, all on the left.  The maximum Gleason score was 3+4, and this was seen in the left mid lateral and left apex lateral. Additionally, Gleason 3+3 was seen in the left base lateral, left mid, and left apex.  He underwent staging CT A/P and bone scan on 09/19/2020 both of which were negative for visceral or osseous metastatic disease.   The patient reviewed the biopsy results with his urologist and he has kindly been referred today for discussion of potential radiation treatment options.   PREVIOUS RADIATION THERAPY: No  PAST MEDICAL HISTORY:  Past Medical History:  Diagnosis Date  . Diabetes mellitus without complication ([place])   . Insomnia   . Prostate cancer ([place])       PAST SURGICAL HISTORY: Past Surgical History:  Procedure Laterality Date  . PROSTATE BIOPSY      FAMILY HISTORY:  Family History  Problem  Relation Age of Onset  . Colon cancer Mother   . Diabetes Brother   . Breast cancer Neg Hx   . Pancreatic cancer Neg Hx   . Prostate cancer Neg Hx     SOCIAL HISTORY:  Social History   Socioeconomic History  . Marital status: Single    Spouse name: Not on file  . Number of children: 2  . Years of education: Not on file  . Highest education level: Not on file  Occupational History    Comment: retired  Tobacco Use  . Smoking status: Never Smoker  . Smokeless tobacco: Never Used  Vaping Use  . Vaping Use: Never used  Substance and Sexual Activity  . Alcohol use: No  . Drug use: No  . Sexual activity: Yes  Other Topics Concern  . Not on file  Social History Narrative  . Not on file   Social Determinants of Health   Financial Resource Strain: Not on file  Food Insecurity: Not on file  Transportation Needs: Not on file  Physical Activity: Not on file  Stress: Not on file  Social Connections: Not on file  Intimate Partner Violence: Not on file    ALLERGIES: Patient has no known allergies.  MEDICATIONS:  Current Outpatient Medications  Medication Sig Dispense Refill  . atorvastatin (LIPITOR) 40 MG tablet TAKE 1 TABLET BY MOUTH EVERY DAY 90 tablet 1  . blood glucose meter kit and supplies Dispense  based on patient and insurance preference. Use to check glucose daily (FOR ICD-10 E10.9, E11.9). 1 each 0  . Continuous Blood Gluc Receiver (DEXCOM G6 RECEIVER) DEVI 1 Device by Does not apply route as directed. 1 each 0  . Continuous Blood Gluc Sensor (DEXCOM G6 SENSOR) MISC 1 Device by Does not apply route as directed. 9 each 3  . Continuous Blood Gluc Transmit (DEXCOM G6 TRANSMITTER) MISC 1 Device by Does not apply route as directed. 1 each 3  . diltiazem (CARDIZEM LA) 300 MG 24 hr tablet Take 1 tablet (300 mg total) by mouth daily. 30 tablet 3  . hydrochlorothiazide (HYDRODIURIL) 12.5 MG tablet Take 1 tablet (12.5 mg total) by mouth daily. 30 tablet 3  . insulin isophane &  regular human (NOVOLIN 70/30 FLEXPEN) (70-30) 100 UNIT/ML KwikPen Inject 20 Units into the skin daily before breakfast AND 22 Units at bedtime. 15 mL 11  . Insulin Pen Needle 31G X 8 MM MISC Use as directed for checking blood sugar 3 times daily, more frequently for hyper or hypoglycemia. 100 each 3  . Lancet Devices (ONE TOUCH DELICA LANCING DEV) MISC 1 Device by Does not apply route as directed. 1 each 0  . latanoprost (XALATAN) 0.005 % ophthalmic solution SMARTSIG:In Eye(s)    . losartan (COZAAR) 100 MG tablet TAKE 1 TABLET BY MOUTH EVERY DAY 90 tablet 1  . omeprazole (PRILOSEC) 40 MG capsule TAKE 1 CAPSULE BY MOUTH EVERY DAY (Patient taking differently: as needed.) 30 capsule 1  . OneTouch Delica Lancets 03K MISC 1 Device by Does not apply route as directed. Use to test up to 4x daily. 400 each 1  . ONETOUCH ULTRA test strip USE UP TO 4X DAILY 300 strip 2   No current facility-administered medications for this encounter.    REVIEW OF SYSTEMS:  On review of systems, the patient reports that he is doing well overall. He denies any chest pain, shortness of breath, cough, fevers, chills, night sweats, unintended weight changes. He denies any bowel disturbances, and denies abdominal pain, nausea or vomiting. He denies any new musculoskeletal or joint aches or pains. His IPSS was 4, indicating mild urinary symptoms. His SHIM was 1, indicating he has severe erectile dysfunction. A complete review of systems is obtained and is otherwise negative.    PHYSICAL EXAM:  Wt Readings from Last 3 Encounters:  11/13/20 159 lb (72.1 kg)  10/24/20 156 lb (70.8 kg)  09/24/20 159 lb (72.1 kg)   Temp Readings from Last 3 Encounters:  10/24/20 97.7 F (36.5 C) (Oral)  09/24/20 98 F (36.7 C) (Oral)  09/11/20 98.1 F (36.7 C) (Oral)   BP Readings from Last 3 Encounters:  10/24/20 138/77  09/24/20 (!) 158/82  09/11/20 (!) 151/72   Pulse Readings from Last 3 Encounters:  10/24/20 84  09/24/20 78   09/11/20 72   Pain Assessment Pain Score: 0-No pain/10  In general this is a well appearing African American gentleman in no acute distress. He's alert and oriented x4 and appropriate throughout the examination. Cardiopulmonary assessment is negative for acute distress and he exhibits normal effort.    KPS = 90  100 - Normal; no complaints; no evidence of disease. 90   - Able to carry on normal activity; minor signs or symptoms of disease. 80   - Normal activity with effort; some signs or symptoms of disease. 53   - Cares for self; unable to carry on normal activity or to do active work.  60   - Requires occasional assistance, but is able to care for most of his personal needs. 50   - Requires considerable assistance and frequent medical care. 39   - Disabled; requires special care and assistance. 97   - Severely disabled; hospital admission is indicated although death not imminent. 58   - Very sick; hospital admission necessary; active supportive treatment necessary. 10   - Moribund; fatal processes progressing rapidly. 0     - Dead  Karnofsky DA, Abelmann [place], Craver LS and Burchenal [facility] [phone]) The use of the nitrogen mustards in the palliative treatment of carcinoma: with particular reference to bronchogenic carcinoma Cancer 1 634-56  LABORATORY DATA:  Lab Results  Component Value Date   WBC 4.1 09/07/2020   HGB 12.2 (L) 09/07/2020   HCT 35.1 (L) 09/07/2020   MCV 91.6 09/07/2020   PLT 196 09/07/2020   Lab Results  Component Value Date   NA 141 09/07/2020   K 4.2 09/07/2020   CL 106 09/07/2020   CO2 27 09/07/2020   Lab Results  Component Value Date   ALT 29 09/07/2020   AST 33 09/07/2020   ALKPHOS 110 02/21/2020   BILITOT 0.5 09/07/2020     RADIOGRAPHY: No results found.    IMPRESSION/PLAN: This visit was conducted via MyChart to spare the patient unnecessary potential exposure in the healthcare setting during the current COVID-19 pandemic. 1. 70 y.o. gentleman  with Stage T1c adenocarcinoma of the prostate with Gleason Score of 3+4, and PSA of 6.49. We discussed the patient's workup and outlined the nature of prostate cancer in this setting. The patient's T stage, Gleason's score, and PSA put him into the favorable intermediate risk group. Accordingly, he is eligible for a variety of potential treatment options including brachytherapy, 5.5 weeks of external radiation, or prostatectomy. We discussed the available radiation techniques, and focused on the details and logistics of delivery. The patient is not an ideal candidate for brachytherapy with a prostate volume of 17 cc.  Therefore, we discussed and outlined the risks, benefits, short and long-term effects associated with external beam radiotherapy and compared and contrasted these with prostatectomy. We discussed the role of SpaceOAR in reducing the rectal toxicity associated with radiotherapy.  He and his girlfriend were encouraged to ask questions that were answered to their stated satisfaction.  At the end of the conversation, the patient is interested in moving forward with 5.5 weeks of external beam therapy. We will share our discussion with Dr. Jeffie Pollock and make arrangements for fiducial markers and SpaceOAR gel placement, first available, prior to CT simulation, to reduce rectal toxicity from radiotherapy. The patient appears to have a good understanding of his disease and our treatment recommendations which are of curative intent and is in agreement with the stated plan.  Therefore, we will move forward with treatment planning accordingly, in anticipation of beginning IMRT in the near future.  Given current concerns for patient exposure during the COVID-19 pandemic, this encounter was conducted via video-enabled MyChart visit. The patient has given verbal consent for this type of encounter. The time spent during this encounter was 45 minutes. The attendants for this meeting include Tyler Pita MD,  Ashlyn Bruning PA-C, Katie [place], and patient Shimon Trowbridge and his daughter. During the encounter, Tyler Pita MD, Ashlyn Bruning PA-C, and scribe, Wilburn Mylar were located at [place].  Patient Rhylee Nunn and his daughter were located at home.    Nicholos Johns,  PA-C    Tyler Pita, MD  [facility] Direct Dial: [phone]  Fax: [phone] [email or web address]  Skype  [email or web address]  This document serves as a record of services personally performed by Tyler Pita, MD and Freeman Caldron, PA-C. It was created on their behalf by Wilburn Mylar, a trained medical scribe. The creation of this record is based on the scribe's personal observations and the provider's statements to them. This document has been checked and approved by the attending provider.

## 2020-11-13 NOTE — Addendum Note (Signed)
Encounter addended by: Aurea Graff on: 11/13/2020 2:45 PM  Actions taken: Pend clinical note

## 2020-11-13 NOTE — Progress Notes (Signed)
GU Location of Tumor / Histology: prostatic adenocarcinoma  If Prostate Cancer, Gleason Score is (3 + 4) and PSA is (6.49). Prostate volume: 17 mL.  [name] presented with a persistently rising PSA prompting a prostate biopsy. Unfortunately, biopsy revealed gleason 3+4 prostatic adenocarcinoma.  02/21/20 psa 4.99 01/22/16 psa 2.69  Biopsies of prostate (if applicable) revealed:    Past/Anticipated interventions by urology, if any: prostate biopsy, CT scan (negative), bone scan (negative), referral to Dr. Tammi Klippel to discuss radiation options  Past/Anticipated interventions by medical oncology, if any: no  Weight changes, if any: no  Bowel/Bladder complaints, if any: IPSS 4. SHIM 1. Denies dysuria or hematuria. Denies urinary leakage or incontinence. Denies any bowel complaints.    Nausea/Vomiting, if any: no  Pain issues, if any:  no  SAFETY ISSUES:  Prior radiation? no  Pacemaker/ICD? no  Possible current pregnancy? no, male patient  Is the patient on methotrexate? no  Current Complaints / other details:  70 year old male. Single. One daughter and one son.

## 2020-11-13 NOTE — Addendum Note (Signed)
Encounter addended by: Heywood Footman, RN on: 11/13/2020 1:23 PM  Actions taken: Check In activity completed

## 2020-11-13 NOTE — Addendum Note (Signed)
Encounter addended by: Tyler Pita, MD on: 11/13/2020 5:16 PM  Actions taken: Medication List reviewed, Problem List reviewed, Allergies reviewed, Clinical Note Signed

## 2020-11-13 NOTE — Addendum Note (Signed)
Encounter addended by: Freeman Caldron, PA-C on: 11/13/2020 3:55 PM  Actions taken: SmartForm saved, Pend clinical note, Level of Service modified, Flowsheet accepted

## 2020-11-13 NOTE — Addendum Note (Signed)
Encounter addended by: Heywood Footman, RN on: 11/13/2020 2:20 PM  Actions taken: External Videoconference Connected, External Videoconference Disconnected

## 2020-11-13 NOTE — Addendum Note (Signed)
Encounter addended by: Heywood Footman, RN on: 11/13/2020 1:51 PM  Actions taken: External Videoconference Connected, Reconcile Outside Information medication data discarded, Outside historical medications filed, Reconcile Outside Information problem data discarded, Problem List modified, Allergies reviewed, Problem List reviewed, Order Reconciliation Section accessed, Order list changed, Medication taking status modified, Medication List reviewed, Home Medications modified, Flowsheet accepted, Clinical Note Signed, Charge Capture section accepted, External Videoconference Disconnected

## 2020-11-13 NOTE — Addendum Note (Signed)
Encounter addended by: Freeman Caldron, PA-C on: 11/13/2020 2:23 PM  Actions taken: External Videoconference Connected

## 2020-11-13 NOTE — Addendum Note (Signed)
Encounter addended by: Heywood Footman, RN on: 11/13/2020 1:55 PM  Actions taken: External Videoconference Connected, External Videoconference Disconnected

## 2020-11-13 NOTE — Addendum Note (Signed)
Encounter addended by: Heywood Footman, RN on: 11/13/2020 2:01 PM  Actions taken: External Videoconference Connected, External Videoconference Disconnected

## 2020-11-14 ENCOUNTER — Telehealth: Payer: Self-pay | Admitting: Radiation Oncology

## 2020-11-14 ENCOUNTER — Encounter (HOSPITAL_BASED_OUTPATIENT_CLINIC_OR_DEPARTMENT_OTHER): Payer: Medicare HMO | Admitting: Physician Assistant

## 2020-11-14 DIAGNOSIS — N429 Disorder of prostate, unspecified: Secondary | ICD-10-CM | POA: Diagnosis not present

## 2020-11-14 DIAGNOSIS — L97512 Non-pressure chronic ulcer of other part of right foot with fat layer exposed: Secondary | ICD-10-CM | POA: Diagnosis not present

## 2020-11-14 DIAGNOSIS — E11621 Type 2 diabetes mellitus with foot ulcer: Secondary | ICD-10-CM | POA: Diagnosis not present

## 2020-11-14 DIAGNOSIS — L84 Corns and callosities: Secondary | ICD-10-CM | POA: Diagnosis not present

## 2020-11-14 DIAGNOSIS — I10 Essential (primary) hypertension: Secondary | ICD-10-CM | POA: Diagnosis not present

## 2020-11-14 DIAGNOSIS — L97522 Non-pressure chronic ulcer of other part of left foot with fat layer exposed: Secondary | ICD-10-CM | POA: Diagnosis not present

## 2020-11-14 NOTE — Telephone Encounter (Signed)
Received voicemail message after patient's consult from his significant other, Silva Bandy. She wanted to be sure [facility] staff are aware [name] number is [phone]. Passed this information along to [employer] in our financial department.

## 2020-11-14 NOTE — Progress Notes (Addendum)
BROK, STOCKING ([id number]) Visit Report for 11/14/2020 Chief Complaint Document Details Patient Name: Date of Service: [name] 11/14/2020 8:30 A M Medical Record Number: [id number] Patient Account Number: [id number] Date of Birth/Sex: Treating RN: 02-Jan-1951 (70 y.o. [name] Primary Care Provider: [name] Other Clinician: Referring Provider: Treating Provider/Extender: Damita Lack Weeks in Treatment: 7 Information Obtained from: Patient Chief Complaint Bilateral T Ulcers oe Electronic Signature(s) Signed: 11/14/2020 8:22:33 AM By: Lenda Kelp PA-C Entered By: Lenda Kelp on 11/14/2020 08:22:33 -------------------------------------------------------------------------------- HPI Details Patient Name: Date of Service: [name], [name] 11/14/2020 8:30 A M Medical Record Number: [id number] Patient Account Number: [id number] Date of Birth/Sex: Treating RN: [birth year] (70 y.o. [name] Primary Care Provider: [name] Other Clinician: Referring Provider: Treating Provider/Extender: Damita Lack Weeks in Treatment: 7 History of Present Illness HPI Description: 09/26/2020 on evaluation today patient appears to be doing somewhat poorly in regard to his bilateral feet at this point due to issues that he is been having that developed about 2 weeks ago. He was walking home which was about a mile and states that he first noted the areas on the right foot beginning. These were blisters that develop. The left started Thanksgiving day when he was up standing more cooking. He did see his primary care provider who referred him to podiatry. He saw Dr. Allena Katz and Dr. Allena Katz recommended Betadine moistened gauze dressings to the wounds and to follow-up in 2 weeks. With that being said the patient subsequently was concerned about infection 2 to 3 days later as was his family member who is with him today. I believe  this was a sister. With that being said subsequently he ended up going to urgent care at that point he was given Diflucan and Keflex he is done with both at this time. He is could be canceling the appointment with Dr. Allena Katz they tell me at this point. His most recent hemoglobin A1c was 6.6 on November 12. His ABIs were 1.2 on the right and 1.03 on the left and appear to be doing excellent. With that being said he does not have diabetic shoes and I feel like the shoes were likely the culprit for what led to this issue currently. There does not appear to be any signs of systemic infection nor local infection at this point which is great news. The patient does have a history of hypertension as well as potentially a recurrence of prostate cancer he is being followed by the cancer center for that at this point 10/03/2020 upon evaluation today patient appears to be doing better in regard to his toe ulcers. He has been tolerating the dressing changes without complication. Fortunately there is no signs of active infection at this time. The right great toe and fifth toe are the 2 worst out of everything here he has a couple that have healed on the left foot there is really nothing remaining open though he has several areas of callus on the left foot in fact 3 on the third fourth and fifth toes. That is going need to be removed today as well. 10/10/2020 upon evaluation today patient appears to be doing well in regard to his wounds. He still has wounds open on the first, second, and fifth toes of the right foot. Left foot is completely healed and the right fourth toe is also completely healed today. Overall very pleased with how things seem to be progressing. 10/17/2020 on  evaluation today patient appears to be doing much better in regard to the right first, second, and fifth toes. He is making excellent progress and to be honest I am extremely pleased with where things stand today. Fortunately there is no signs  of active infection at this time. No fevers, chills, nausea, vomiting, or diarrhea. 11/07/2020 upon evaluation today patient appears to actually be doing excellent in regard to his toe ulcers. The fifth and first toe on the right foot are what is left at this point. There does not appear to be signs of infection currently which is great news. With that being said I did perform some sharp debridement today to remove some of the callus around the edges of the wound 11/14/2020 upon evaluation today patient appears to be doing excellent in regard to his toe ulcers. In fact he is almost completely healed he just has 1 area on the great toe remaining at this time and this is very small. Overall he is extremely pleased with where things stand as MI. Electronic Signature(s) Signed: 11/14/2020 11:26:42 AM By: Lenda Kelp PA-C Entered By: Lenda Kelp on 11/14/2020 11:26:41 -------------------------------------------------------------------------------- Physical Exam Details Patient Name: Date of Service: [name], [name] 11/14/2020 8:30 A M Medical Record Number: [id number] Patient Account Number: [id number] Date of Birth/Sex: Treating RN: 09-Feb-1951 (70 y.o. [name] Primary Care Provider: [name] Other Clinician: Referring Provider: Treating Provider/Extender: Damita Lack Weeks in Treatment: 7 Constitutional Well-nourished and well-hydrated in no acute distress. Respiratory normal breathing without difficulty. Psychiatric this patient is able to make decisions and demonstrates good insight into disease process. Alert and Oriented x 3. pleasant and cooperative. Notes His wound bed actually showed signs of good granulation and epithelization no sharp debridement was necessary today which is great news and overall I feel like he is doing excellent. I do think continuing with the current wound care measures would be optimal and then hopefully the patient will  show signs of complete healing as of next week if not we will be very close in my opinion. Electronic Signature(s) Signed: 11/14/2020 11:27:08 AM By: Lenda Kelp PA-C Entered By: Lenda Kelp on 11/14/2020 11:27:08 -------------------------------------------------------------------------------- Physician Orders Details Patient Name: Date of Service: [name], [name] 11/14/2020 8:30 A M Medical Record Number: [id number] Patient Account Number: [id number] Date of Birth/Sex: Treating RN: 01/01/1951 (70 y.o. [name] Primary Care Provider: [name] Other Clinician: Referring Provider: Treating Provider/Extender: Damita Lack Weeks in Treatment: 7 Verbal / Phone Orders: No Diagnosis Coding ICD-10 Coding Code Description E11.621 Type 2 diabetes mellitus with foot ulcer L97.522 Non-pressure chronic ulcer of other part of left foot with fat layer exposed L97.512 Non-pressure chronic ulcer of other part of right foot with fat layer exposed I10 Essential (primary) hypertension N42.9 Disorder of prostate, unspecified L84 Corns and callosities Follow-up Appointments Return Appointment in 1 week. Bathing/ Shower/ Hygiene May shower and wash wound with soap and water. Off-Loading Open toe surgical shoe to: - both feet Wound Treatment Wound #4 - T Great oe Wound Laterality: Plantar, Right Peri-Wound Care: Sween Lotion (Moisturizing lotion) Every Other Day/30 Days Discharge Instructions: Apply moisturizing lotion to feet with dressing changes Prim Dressing: KerraCel Ag Gelling Fiber Dressing, 2x2 in (silver alginate) Every Other Day/30 Days ary Discharge Instructions: Apply silver alginate to wound bed as instructed Secondary Dressing: Woven Gauze Sponges 2x2 in Every Other Day/30 Days Discharge Instructions: Apply over primary dressing as directed. Secured  With: Conforming Stretch Gauze Bandage, Sterile 2x75 (in/in) Every Other Day/30 Days Discharge  Instructions: Secure with stretch gauze as directed. Secured With: Paper Tape, 1x10 (in/yd) Every Other Day/30 Days Discharge Instructions: Secure dressing with tape as directed. Electronic Signature(s) Signed: 11/14/2020 12:00:41 PM By: Baruch Gouty RN, BSN Signed: 11/14/2020 6:05:28 PM By: Worthy Keeler PA-C Entered By: Baruch Gouty on 11/14/2020 09:05:00 -------------------------------------------------------------------------------- Problem List Details Patient Name: Date of Service: [name], [name] 11/14/2020 8:30 A M Medical Record Number: [id number] Patient Account Number: [id number] Date of Birth/Sex: Treating RN: 06-21-1951 (70 y.o. [name] Primary Care Provider: Grier Mitts Other Clinician: Referring Provider: Treating Provider/Extender: Natalia Leatherwood Weeks in Treatment: 7 Active Problems ICD-10 Encounter Code Description Active Date MDM Diagnosis E11.621 Type 2 diabetes mellitus with foot ulcer 09/26/2020 No Yes L97.522 Non-pressure chronic ulcer of other part of left foot with fat layer exposed 09/26/2020 No Yes L97.512 Non-pressure chronic ulcer of other part of right foot with fat layer exposed 09/26/2020 No Yes I10 Essential (primary) hypertension 09/26/2020 No Yes N42.9 Disorder of prostate, unspecified 09/26/2020 No Yes L84 Corns and callosities 10/03/2020 No Yes Inactive Problems Resolved Problems Electronic Signature(s) Signed: 11/14/2020 8:22:23 AM By: Worthy Keeler PA-C Entered By: Worthy Keeler on 11/14/2020 08:22:23 -------------------------------------------------------------------------------- Progress Note Details Patient Name: Date of Service: [name], [name] 11/14/2020 8:30 A M Medical Record Number: [id number] Patient Account Number: [id number] Date of Birth/Sex: Treating RN: 02/29/1951 (70 y.o. [name] Primary Care Provider: Grier Mitts Other Clinician: Referring Provider: Treating  Provider/Extender: Natalia Leatherwood Weeks in Treatment: 7 Subjective Chief Complaint Information obtained from Patient Bilateral T Ulcers oe History of Present Illness (HPI) 09/26/2020 on evaluation today patient appears to be doing somewhat poorly in regard to his bilateral feet at this point due to issues that he is been having that developed about 2 weeks ago. He was walking home which was about a mile and states that he first noted the areas on the right foot beginning. These were blisters that develop. The left started Thanksgiving day when he was up standing more cooking. He did see his primary care provider who referred him to podiatry. He saw Dr. Posey Pronto and Dr. Posey Pronto recommended Betadine moistened gauze dressings to the wounds and to follow-up in 2 weeks. With that being said the patient subsequently was concerned about infection 2 to 3 days later as was his family member who is with him today. I believe this was a sister. With that being said subsequently he ended up going to urgent care at that point he was given Diflucan and Keflex he is done with both at this time. He is could be canceling the appointment with Dr. Posey Pronto they tell me at this point. His most recent hemoglobin A1c was 6.6 on November 12. His ABIs were 1.2 on the right and 1.03 on the left and appear to be doing excellent. With that being said he does not have diabetic shoes and I feel like the shoes were likely the culprit for what led to this issue currently. There does not appear to be any signs of systemic infection nor local infection at this point which is great news. The patient does have a history of hypertension as well as potentially a recurrence of prostate cancer he is being followed by the cancer center for that at this point 10/03/2020 upon evaluation today patient appears to be doing better in regard to his toe ulcers.  He has been tolerating the dressing changes without complication. Fortunately  there is no signs of active infection at this time. The right great toe and fifth toe are the 2 worst out of everything here he has a couple that have healed on the left foot there is really nothing remaining open though he has several areas of callus on the left foot in fact 3 on the third fourth and fifth toes. That is going need to be removed today as well. 10/10/2020 upon evaluation today patient appears to be doing well in regard to his wounds. He still has wounds open on the first, second, and fifth toes of the right foot. Left foot is completely healed and the right fourth toe is also completely healed today. Overall very pleased with how things seem to be progressing. 10/17/2020 on evaluation today patient appears to be doing much better in regard to the right first, second, and fifth toes. He is making excellent progress and to be honest I am extremely pleased with where things stand today. Fortunately there is no signs of active infection at this time. No fevers, chills, nausea, vomiting, or diarrhea. 11/07/2020 upon evaluation today patient appears to actually be doing excellent in regard to his toe ulcers. The fifth and first toe on the right foot are what is left at this point. There does not appear to be signs of infection currently which is great news. With that being said I did perform some sharp debridement today to remove some of the callus around the edges of the wound 11/14/2020 upon evaluation today patient appears to be doing excellent in regard to his toe ulcers. In fact he is almost completely healed he just has 1 area on the great toe remaining at this time and this is very small. Overall he is extremely pleased with where things stand as MI. Objective Constitutional Well-nourished and well-hydrated in no acute distress. Vitals Time Taken: 8:20 AM, Height: 66 in, Weight: 159 lbs, BMI: 25.7, Temperature: 98.0 F, Pulse: 80 bpm, Respiratory Rate: 16 breaths/min, Blood  Pressure: 171/80 mmHg, Capillary Blood Glucose: 156 mg/dl. Respiratory normal breathing without difficulty. Psychiatric this patient is able to make decisions and demonstrates good insight into disease process. Alert and Oriented x 3. pleasant and cooperative. General Notes: His wound bed actually showed signs of good granulation and epithelization no sharp debridement was necessary today which is great news and overall I feel like he is doing excellent. I do think continuing with the current wound care measures would be optimal and then hopefully the patient will show signs of complete healing as of next week if not we will be very close in my opinion. Integumentary (Hair, Skin) Wound #2 status is Healed - Epithelialized. Original cause of wound was Blister. The wound is located on the Right,Plantar T Fifth. The wound measures 0cm oe length x 0cm width x 0cm depth; 0cm^2 area and 0cm^3 volume. There is no tunneling or undermining noted. There is a none present amount of drainage noted. The wound margin is thickened. There is no granulation within the wound bed. There is no necrotic tissue within the wound bed. Wound #4 status is Open. Original cause of wound was Blister. The wound is located on the [employer]. The wound measures 0.5cm length x oe 0.2cm width x 0.1cm depth; 0.079cm^2 area and 0.008cm^3 volume. There is Fat Layer (Subcutaneous Tissue) exposed. There is no tunneling or undermining noted. There is a small amount of serosanguineous drainage noted. The  wound margin is flat and intact. There is large (67-100%) pink granulation within the wound bed. There is no necrotic tissue within the wound bed. Assessment Active Problems ICD-10 Type 2 diabetes mellitus with foot ulcer Non-pressure chronic ulcer of other part of left foot with fat layer exposed Non-pressure chronic ulcer of other part of right foot with fat layer exposed Essential (primary) hypertension Disorder of  prostate, unspecified Corns and callosities Plan Follow-up Appointments: Return Appointment in 1 week. Bathing/ Shower/ Hygiene: May shower and wash wound with soap and water. Off-Loading: Open toe surgical shoe to: - both feet WOUND #4: - T Great Wound Laterality: Plantar, Right oe Peri-Wound Care: Sween Lotion (Moisturizing lotion) Every Other Day/30 Days Discharge Instructions: Apply moisturizing lotion to feet with dressing changes Prim Dressing: KerraCel Ag Gelling Fiber Dressing, 2x2 in (silver alginate) Every Other Day/30 Days ary Discharge Instructions: Apply silver alginate to wound bed as instructed Secondary Dressing: Woven Gauze Sponges 2x2 in Every Other Day/30 Days Discharge Instructions: Apply over primary dressing as directed. Secured With: Child psychotherapist, Sterile 2x75 (in/in) Every Other Day/30 Days Discharge Instructions: Secure with stretch gauze as directed. Secured With: Paper T ape, 1x10 (in/yd) Every Other Day/30 Days Discharge Instructions: Secure dressing with tape as directed. 1. Would recommend currently that we continue with the wound care measures as before pacifically with regard to the silver alginate dressing which has been excellent up to this point. 2. I would recommend as well as continue with the offloading shoe that seems to have done excellent for him. We will see patient back for reevaluation in 1 week here in the clinic. If anything worsens or changes patient will contact our office for additional recommendations. Electronic Signature(s) Signed: 11/14/2020 11:27:31 AM By: Worthy Keeler PA-C Entered By: Worthy Keeler on 11/14/2020 11:27:31 -------------------------------------------------------------------------------- SuperBill Details Patient Name: Date of Service: [name], [name] 11/14/2020 Medical Record Number: [id number] Patient Account Number: [id number] Date of Birth/Sex: Treating RN: 1950-12-14 (70 y.o. [name] Primary Care Provider: Grier Mitts Other Clinician: Referring Provider: Treating Provider/Extender: Natalia Leatherwood Weeks in Treatment: 7 Diagnosis Coding ICD-10 Codes Code Description E11.621 Type 2 diabetes mellitus with foot ulcer L97.522 Non-pressure chronic ulcer of other part of left foot with fat layer exposed L97.512 Non-pressure chronic ulcer of other part of right foot with fat layer exposed I10 Essential (primary) hypertension N42.9 Disorder of prostate, unspecified L84 Corns and callosities Facility Procedures The patient participates with Medicare or their insurance follows the Medicare Facility Guidelines: CPT4 Code Description Modifier Quantity [id number] [place] VISIT-LEV 3 EST PT 1 Physician Procedures : CPT4 Code Description Modifier [id number] - WC PHYS LEVEL 3 - EST PT ICD-10 Diagnosis Description E11.621 Type 2 diabetes mellitus with foot ulcer L97.522 Non-pressure chronic ulcer of other part of left foot with fat layer exposed L97.512  Non-pressure chronic ulcer of other part of right foot with fat layer exposed I10 Essential (primary) hypertension Quantity: 1 Electronic Signature(s) Signed: 11/14/2020 11:27:44 AM By: Worthy Keeler PA-C Entered By: Worthy Keeler on 11/14/2020 11:27:43

## 2020-11-19 ENCOUNTER — Telehealth: Payer: Self-pay | Admitting: Radiation Oncology

## 2020-11-19 NOTE — Telephone Encounter (Signed)
Received voicemail message from [place], patient's significant other, confused about appointments with Dr. Tyler Pita vs. Dr. Alexis Frock. Phoned back. No answer. Left voicemail message explaining my assumption that his appointment with Dr. Alexis Frock is to discuss fiducial marker and spaceoar placement. Provided direct number for further questions.

## 2020-11-21 ENCOUNTER — Encounter (HOSPITAL_BASED_OUTPATIENT_CLINIC_OR_DEPARTMENT_OTHER): Payer: Medicare HMO | Admitting: Physician Assistant

## 2020-11-21 ENCOUNTER — Other Ambulatory Visit: Payer: Self-pay

## 2020-11-21 DIAGNOSIS — L97522 Non-pressure chronic ulcer of other part of left foot with fat layer exposed: Secondary | ICD-10-CM | POA: Diagnosis not present

## 2020-11-21 DIAGNOSIS — I10 Essential (primary) hypertension: Secondary | ICD-10-CM | POA: Diagnosis not present

## 2020-11-21 DIAGNOSIS — L84 Corns and callosities: Secondary | ICD-10-CM | POA: Diagnosis not present

## 2020-11-21 DIAGNOSIS — N429 Disorder of prostate, unspecified: Secondary | ICD-10-CM | POA: Diagnosis not present

## 2020-11-21 DIAGNOSIS — E11621 Type 2 diabetes mellitus with foot ulcer: Secondary | ICD-10-CM | POA: Diagnosis not present

## 2020-11-21 DIAGNOSIS — L97512 Non-pressure chronic ulcer of other part of right foot with fat layer exposed: Secondary | ICD-10-CM | POA: Diagnosis not present

## 2020-11-21 NOTE — Progress Notes (Signed)
[name], [name] ([id number]) Visit Report for 11/21/2020 Arrival Information Details Patient Name: Date of Service: [name] 11/21/2020 9:30 A M Medical Record Number: [id number] Patient Account Number: [id number] Date of Birth/Sex: Treating RN: Nov 05, 1950 (70 y.o. [name], [name] Primary Care Reece Fehnel: Grier Mitts Other Clinician: Referring Kourtnei Rauber: Treating Demetrio Leighty/Extender: Natalia Leatherwood Weeks in Treatment: 8 Visit Information History Since Last Visit Added or deleted any medications: No Patient Arrived: Ambulatory Any new allergies or adverse reactions: No Arrival Time: 09:19 Had a fall or experienced change in No Accompanied By: wife activities of daily living that may affect Transfer Assistance: None risk of falls: Patient Identification Verified: Yes Signs or symptoms of abuse/neglect since last visito No Secondary Verification Process Completed: Yes Hospitalized since last visit: No Patient Requires Transmission-Based Precautions: No Implantable device outside of the clinic excluding No Patient Has Alerts: No cellular tissue based products placed in the center since last visit: Pain Present Now: No Electronic Signature(s) Signed: 11/21/2020 5:56:51 PM By: Rhae Hammock RN Entered By: Rhae Hammock on 11/21/2020 09:22:02 -------------------------------------------------------------------------------- Clinic Level of Care Assessment Details Patient Name: Date of Service: [facility] RD, [name] 11/21/2020 9:30 A M Medical Record Number: [id number] Patient Account Number: [id number] Date of Birth/Sex: Treating RN: 1950-12-20 (70 y.o. [name] Primary Care Alphus Zeck: Grier Mitts Other Clinician: Referring Ramanda Paules: Treating Ioana Louks/Extender: Natalia Leatherwood Weeks in Treatment: 8 Clinic Level of Care Assessment Items TOOL 4 Quantity Score []  - 0 Use when only an EandM is performed on FOLLOW-UP  visit ASSESSMENTS - Nursing Assessment / Reassessment X- 1 10 Reassessment of Co-morbidities (includes updates in patient status) X- 1 5 Reassessment of Adherence to Treatment Plan ASSESSMENTS - Wound and Skin A ssessment / Reassessment X - Simple Wound Assessment / Reassessment - one wound 1 5 []  - 0 Complex Wound Assessment / Reassessment - multiple wounds []  - 0 Dermatologic / Skin Assessment (not related to wound area) ASSESSMENTS - Focused Assessment []  - 0 Circumferential Edema Measurements - multi extremities []  - 0 Nutritional Assessment / Counseling / Intervention X- 1 5 Lower Extremity Assessment (monofilament, tuning fork, pulses) []  - 0 Peripheral Arterial Disease Assessment (using hand held doppler) ASSESSMENTS - Ostomy and/or Continence Assessment and Care []  - 0 Incontinence Assessment and Management []  - 0 Ostomy Care Assessment and Management (repouching, etc.) PROCESS - Coordination of Care X - Simple Patient / Family Education for ongoing care 1 15 []  - 0 Complex (extensive) Patient / Family Education for ongoing care X- 1 10 Staff obtains Consents, Records, T Results / Process Orders est []  - 0 Staff telephones HHA, Nursing Homes / Clarify orders / etc []  - 0 Routine Transfer to another Facility (non-emergent condition) []  - 0 Routine Hospital Admission (non-emergent condition) []  - 0 New Admissions / Biomedical engineer / Ordering NPWT Apligraf, etc. , []  - 0 Emergency Hospital Admission (emergent condition) X- 1 10 Simple Discharge Coordination []  - 0 Complex (extensive) Discharge Coordination PROCESS - Special Needs []  - 0 Pediatric / Minor Patient Management []  - 0 Isolation Patient Management []  - 0 Hearing / Language / Visual special needs []  - 0 Assessment of Community assistance (transportation, D/C planning, etc.) []  - 0 Additional assistance / Altered mentation []  - 0 Support Surface(s) Assessment (bed, cushion, seat,  etc.) INTERVENTIONS - Wound Cleansing / Measurement X - Simple Wound Cleansing - one wound 1 5 []  - 0 Complex Wound Cleansing - multiple wounds X- 1 5 Wound Imaging (  photographs - any number of wounds) []  - 0 Wound Tracing (instead of photographs) []  - 0 Simple Wound Measurement - one wound []  - 0 Complex Wound Measurement - multiple wounds INTERVENTIONS - Wound Dressings []  - 0 Small Wound Dressing one or multiple wounds []  - 0 Medium Wound Dressing one or multiple wounds []  - 0 Large Wound Dressing one or multiple wounds []  - 0 Application of Medications - topical []  - 0 Application of Medications - injection INTERVENTIONS - Miscellaneous []  - 0 External ear exam []  - 0 Specimen Collection (cultures, biopsies, blood, body fluids, etc.) []  - 0 Specimen(s) / Culture(s) sent or taken to Lab for analysis []  - 0 Patient Transfer (multiple staff / Civil Service fast streamer / Similar devices) []  - 0 Simple Staple / Suture removal (25 or less) []  - 0 Complex Staple / Suture removal (26 or more) []  - 0 Hypo / Hyperglycemic Management (close monitor of Blood Glucose) []  - 0 Ankle / Brachial Index (ABI) - do not check if billed separately X- 1 5 Vital Signs Has the patient been seen at the hospital within the last three years: Yes Total Score: 75 Level Of Care: New/Established - Level 2 Electronic Signature(s) Signed: 11/21/2020 6:09:51 PM By: Baruch Gouty RN, BSN Entered By: Baruch Gouty on 11/21/2020 09:34:13 -------------------------------------------------------------------------------- Encounter Discharge Information Details Patient Name: Date of Service: [name] RD, [name] 11/21/2020 9:30 A M Medical Record Number: [id number] Patient Account Number: [id number] Date of Birth/Sex: Treating RN: 1951-09-24 (70 y.o. [name] Primary Care Randi Poullard: Grier Mitts Other Clinician: Referring Krrish Freund: Treating Sua Spadafora/Extender: Katherine Basset in  Treatment: 8 Encounter Discharge Information Items Discharge Condition: Stable Ambulatory Status: Ambulatory Discharge Destination: Home Transportation: Private Auto Accompanied By: spouse Schedule Follow-up Appointment: Yes Clinical Summary of Care: Patient Declined Electronic Signature(s) Signed: 11/21/2020 6:09:51 PM By: Baruch Gouty RN, BSN Entered By: Baruch Gouty on 11/21/2020 09:35:20 -------------------------------------------------------------------------------- Lower Extremity Assessment Details Patient Name: Date of Service: [facility] RD, [name] 11/21/2020 9:30 A M Medical Record Number: [id number] Patient Account Number: [id number] Date of Birth/Sex: Treating RN: 1951-01-27 (70 y.o. [name], [name] Primary Care Jaquay Morneault: Grier Mitts Other Clinician: Referring Keonda Dow: Treating Twanisha Foulk/Extender: Natalia Leatherwood Weeks in Treatment: 8 Edema Assessment Assessed: [Left: No] Patrice Paradise: No] Edema: [Left: N] [Right: o] Calf Left: Right: Point of Measurement: 37 cm From Medial Instep 34 cm Ankle Left: Right: Point of Measurement: 12 cm From Medial Instep 21 cm Vascular Assessment Pulses: Dorsalis Pedis Palpable: [Right:Yes] Posterior Tibial Palpable: [Right:Yes] Electronic Signature(s) Signed: 11/21/2020 5:56:51 PM By: Rhae Hammock RN Entered By: Rhae Hammock on 11/21/2020 09:24:32 -------------------------------------------------------------------------------- [place] Details Patient Name: Date of Service: [name] RD, [name] 11/21/2020 9:30 A M Medical Record Number: [id number] Patient Account Number: [id number] Date of Birth/Sex: Treating RN: [birth year] (70 y.o. [name] Primary Care Velita Quirk: Grier Mitts Other Clinician: Referring Chais Fehringer: Treating Ruvim Risko/Extender: Natalia Leatherwood Weeks in Treatment: 8 Active Inactive Electronic Signature(s) Signed: 11/21/2020 6:09:51 PM By:  Baruch Gouty RN, BSN Entered By: Baruch Gouty on 11/21/2020 09:27:31 -------------------------------------------------------------------------------- Pain Assessment Details Patient Name: Date of Service: [name] RD, [name] 11/21/2020 9:30 A M Medical Record Number: [id number] Patient Account Number: [id number] Date of Birth/Sex: Treating RN: 03-Feb-1951 (71 y.o. Erie Noe Primary Care Shruti Arrey: Grier Mitts Other Clinician: Referring Marlena Barbato: Treating Sole Lengacher/Extender: Natalia Leatherwood Weeks in Treatment: 8 Active Problems Location of Pain Severity and Description of Pain Patient Has Paino No  Site Locations Rate the pain. Current Pain Level: 0 Pain Management and Medication Current Pain Management: Electronic Signature(s) Signed: 11/21/2020 5:56:51 PM By: Rhae Hammock RN Entered By: Rhae Hammock on 11/21/2020 09:22:27 -------------------------------------------------------------------------------- Patient/Caregiver Education Details Patient Name: Date of Service: [name] RD, [name] 1/26/2022andnbsp9:30 [place] Record Number: [id number] Patient Account Number: [id number] Date of Birth/Gender: Treating RN: 07-19-1951 (70 y.o. [name] Primary Care Physician: Grier Mitts Other Clinician: Referring Physician: Treating Physician/Extender: Katherine Basset in Treatment: 8 Education Assessment Education Provided To: Patient Education Topics Provided Wound/Skin Impairment: Methods: Explain/Verbal Responses: Reinforcements needed, State content correctly Electronic Signature(s) Signed: 11/21/2020 6:09:51 PM By: Baruch Gouty RN, BSN Entered By: Baruch Gouty on 11/21/2020 09:29:17 -------------------------------------------------------------------------------- Wound Assessment Details Patient Name: Date of Service: [name] RD, [name] 11/21/2020 9:30 A M Medical Record Number:  [id number] Patient Account Number: [id number] Date of Birth/Sex: Treating RN: October 17, 1951 (70 y.o. [name], [name] Primary Care Lianny Molter: Grier Mitts Other Clinician: Referring Giani Winther: Treating Leeman Johnsey/Extender: Natalia Leatherwood Weeks in Treatment: 8 Wound Status Wound Number: 4 Primary Etiology: Diabetic Wound/Ulcer of the Lower Extremity Wound Location: Right, Plantar T Great oe Wound Status: Open Wounding Event: Blister Date Acquired: 09/10/2020 Weeks Of Treatment: 8 Clustered Wound: No Wound Measurements Length: (cm) Width: (cm) Depth: (cm) Area: (cm) Volume: (cm) Wound Description Classification: Grade 2 0 % Reduction in Area: 100% 0 % Reduction in Volume: 100% 0 0 0 Electronic Signature(s) Signed: 11/21/2020 5:56:51 PM By: Rhae Hammock RN Entered By: Rhae Hammock on 11/21/2020 09:24:47 -------------------------------------------------------------------------------- Vitals Details Patient Name: Date of Service: [name] RD, [name] 11/21/2020 9:30 A M Medical Record Number: [id number] Patient Account Number: [id number] Date of Birth/Sex: Treating RN: Mar 11, 1951 (70 y.o. [name], [name] Primary Care Claire Bridge: Grier Mitts Other Clinician: Referring Kitt Ledet: Treating Kay Shippy/Extender: Natalia Leatherwood Weeks in Treatment: 8 Vital Signs Time Taken: 09:22 Temperature (F): 98 Height (in): 66 Pulse (bpm): 84 Weight (lbs): 159 Respiratory Rate (breaths/min): 17 Body Mass Index (BMI): 25.7 Blood Pressure (mmHg): 183/83 Capillary Blood Glucose (mg/dl): 120 Reference Range: 80 - 120 mg / dl Electronic Signature(s) Signed: 11/21/2020 5:56:51 PM By: Rhae Hammock RN Entered By: Rhae Hammock on 11/21/2020 09:22:19

## 2020-11-22 NOTE — Progress Notes (Addendum)
MRK, BUZBY ([id number]) Visit Report for 11/21/2020 Chief Complaint Document Details Patient Name: Date of Service: [name] 11/21/2020 9:30 A M Medical Record Number: [id number] Patient Account Number: [id number] Date of Birth/Sex: Treating RN: [birth year] (70 y.o. [name] Primary Care Provider: Grier Mitts Other Clinician: Referring Provider: Treating Provider/Extender: Natalia Leatherwood Weeks in Treatment: 8 Information Obtained from: Patient Chief Complaint Bilateral T Ulcers oe Electronic Signature(s) Signed: 11/21/2020 9:33:07 AM By: Worthy Keeler PA-C Entered By: Worthy Keeler on 11/21/2020 09:33:07 -------------------------------------------------------------------------------- Problem List Details Patient Name: Date of Service: [name], [name] 11/21/2020 9:30 A M Medical Record Number: [id number] Patient Account Number: [id number] Date of Birth/Sex: Treating RN: 10/14/1951 (70 y.o. [name] Primary Care Provider: Grier Mitts Other Clinician: Referring Provider: Treating Provider/Extender: Natalia Leatherwood Weeks in Treatment: 8 Active Problems ICD-10 Encounter Code Description Active Date MDM Diagnosis E11.621 Type 2 diabetes mellitus with foot ulcer 09/26/2020 No Yes L97.522 Non-pressure chronic ulcer of other part of left foot with fat layer exposed 09/26/2020 No Yes L97.512 Non-pressure chronic ulcer of other part of right foot with fat layer exposed 09/26/2020 No Yes I10 Essential (primary) hypertension 09/26/2020 No Yes N42.9 Disorder of prostate, unspecified 09/26/2020 No Yes L84 Corns and callosities 10/03/2020 No Yes Inactive Problems Resolved Problems Electronic Signature(s) Signed: 11/21/2020 9:29:52 AM By: Worthy Keeler PA-C Entered By: Worthy Keeler on 11/21/2020 09:29:50

## 2020-11-29 ENCOUNTER — Encounter: Payer: Self-pay | Admitting: Medical Oncology

## 2020-11-29 NOTE — Progress Notes (Signed)
Spoke with patient to introduce myself and my role as the prostate nurse navigator. I was unable to meet him 1/18, when he consulted with Dr. Tammi Klippel. He decided on 5.5 weeks of radiation. I confirmed with him his 2/9, appointment to get gold markers and SpaceOar gel. I informed him that Enid Derry will call him with CT simulation appointment. He voiced understanding of the above.

## 2020-11-30 ENCOUNTER — Encounter: Payer: Self-pay | Admitting: Medical Oncology

## 2020-11-30 NOTE — Progress Notes (Signed)
Phyllis-significant other called regarding missed call yesterday. I explained I was following up post consult with Dr. Tammi Klippel. I introduced myself and discussed my role as the nurse navigator. We confirmed appointments for 2/9 and 2/11 and I discussed what they are for. I discussed our location and how to get to radiation oncology waiting for the 2/11 appointment since they did telephone consult. I gave her my contact information and asked her to call em with questions or concerns. She voiced her appreciation for the call and states he gets confused and doesn't always understand.

## 2020-12-04 NOTE — Progress Notes (Signed)
LEVOY, GEISEN ([id number]) Visit Report for 11/14/2020 Arrival Information Details Patient Name: Date of Service: [name] 11/14/2020 8:30 [name] M Medical Record Number: [id number] Patient Account Number: [id number] Date of Birth/Sex: Treating RN: 1951/10/26 (70 y.o. Ulyses Amor, Vaughan Basta Primary Care Imogene Gravelle: Grier Mitts Other Clinician: Referring Aydan Phoenix: Treating Britain Anagnos/Extender: Natalia Leatherwood Weeks in Treatment: 7 Visit Information History Since Last Visit Added or deleted any medications: No Patient Arrived: Ambulatory Any new allergies or adverse reactions: No Arrival Time: 08:19 Had [name] fall or experienced change in No Accompanied By: wife activities of daily living that may affect Transfer Assistance: None risk of falls: Patient Identification Verified: Yes Signs or symptoms of abuse/neglect since last visito No Secondary Verification Process Completed: Yes Hospitalized since last visit: No Patient Requires Transmission-Based Precautions: No Implantable device outside of the clinic excluding No Patient Has Alerts: No cellular tissue based products placed in the center since last visit: Has Dressing in Place as Prescribed: Yes Pain Present Now: No Electronic Signature(s) Signed: 11/14/2020 10:10:32 AM By: Sandre Kitty Entered By: Sandre Kitty on 11/14/2020 08:20:39 -------------------------------------------------------------------------------- Clinic Level of Care Assessment Details Patient Name: Date of Service: [name] 11/14/2020 8:30 [name] M Medical Record Number: [id number] Patient Account Number: [id number] Date of Birth/Sex: Treating RN: 1950-11-16 (70 y.o. [name] Primary Care Henery Betzold: Grier Mitts Other Clinician: Referring Lupie Sawa: Treating Paden Kuras/Extender: Natalia Leatherwood Weeks in Treatment: 7 Clinic Level of Care Assessment Items TOOL 4 Quantity Score []  - 0 Use when only an EandM  is performed on FOLLOW-UP visit ASSESSMENTS - Nursing Assessment / Reassessment X- 1 10 Reassessment of Co-morbidities (includes updates in patient status) X- 1 5 Reassessment of Adherence to Treatment Plan ASSESSMENTS - Wound and Skin [name] ssessment / Reassessment []  - 0 Simple Wound Assessment / Reassessment - one wound X- 2 5 Complex Wound Assessment / Reassessment - multiple wounds []  - 0 Dermatologic / Skin Assessment (not related to wound area) ASSESSMENTS - Focused Assessment []  - 0 Circumferential Edema Measurements - multi extremities []  - 0 Nutritional Assessment / Counseling / Intervention X- 1 5 Lower Extremity Assessment (monofilament, tuning fork, pulses) []  - 0 Peripheral Arterial Disease Assessment (using hand held doppler) ASSESSMENTS - Ostomy and/or Continence Assessment and Care []  - 0 Incontinence Assessment and Management []  - 0 Ostomy Care Assessment and Management (repouching, etc.) PROCESS - Coordination of Care X - Simple Patient / Family Education for ongoing care 1 15 []  - 0 Complex (extensive) Patient / Family Education for ongoing care X- 1 10 Staff obtains Consents, Records, T Results / Process Orders est []  - 0 Staff telephones HHA, Nursing Homes / Clarify orders / etc []  - 0 Routine Transfer to another Facility (non-emergent condition) []  - 0 Routine Hospital Admission (non-emergent condition) []  - 0 New Admissions / Biomedical engineer / Ordering NPWT Apligraf, etc. , []  - 0 Emergency Hospital Admission (emergent condition) X- 1 10 Simple Discharge Coordination []  - 0 Complex (extensive) Discharge Coordination PROCESS - Special Needs []  - 0 Pediatric / Minor Patient Management []  - 0 Isolation Patient Management []  - 0 Hearing / Language / Visual special needs []  - 0 Assessment of Community assistance (transportation, D/C planning, etc.) []  - 0 Additional assistance / Altered mentation []  - 0 Support Surface(s) Assessment  (bed, cushion, seat, etc.) INTERVENTIONS - Wound Cleansing / Measurement []  - 0 Simple Wound Cleansing - one wound X- 2 5 Complex Wound Cleansing - multiple wounds X-  1 5 Wound Imaging (photographs - any number of wounds) []  - 0 Wound Tracing (instead of photographs) []  - 0 Simple Wound Measurement - one wound X- 2 5 Complex Wound Measurement - multiple wounds INTERVENTIONS - Wound Dressings X - Small Wound Dressing one or multiple wounds 1 10 []  - 0 Medium Wound Dressing one or multiple wounds []  - 0 Large Wound Dressing one or multiple wounds X- 1 5 Application of Medications - topical []  - 0 Application of Medications - injection INTERVENTIONS - Miscellaneous []  - 0 External ear exam []  - 0 Specimen Collection (cultures, biopsies, blood, body fluids, etc.) []  - 0 Specimen(s) / Culture(s) sent or taken to Lab for analysis []  - 0 Patient Transfer (multiple staff / Civil Service fast streamer / Similar devices) []  - 0 Simple Staple / Suture removal (25 or less) []  - 0 Complex Staple / Suture removal (26 or more) []  - 0 Hypo / Hyperglycemic Management (close monitor of Blood Glucose) []  - 0 Ankle / Brachial Index (ABI) - do not check if billed separately X- 1 5 Vital Signs Has the patient been seen at the hospital within the last three years: Yes Total Score: 110 Level Of Care: New/Established - Level 3 Electronic Signature(s) Signed: 11/14/2020 12:00:41 PM By: Baruch Gouty RN, BSN Entered By: Baruch Gouty on 11/14/2020 09:04:08 -------------------------------------------------------------------------------- Encounter Discharge Information Details Patient Name: Date of Service: [name], [name] 11/14/2020 8:30 [name] M Medical Record Number: [id number] Patient Account Number: [id number] Date of Birth/Sex: Treating RN: [birth year] (70 y.o. [name] Primary Care Trystin Hargrove: Grier Mitts Other Clinician: Referring Zaliah Wissner: Treating Raquel Sayres/Extender: Katherine Basset in Treatment: 7 Encounter Discharge Information Items Discharge Condition: Stable Ambulatory Status: Ambulatory Discharge Destination: Home Transportation: Private Auto Accompanied By: spouse Schedule Follow-up Appointment: Yes Clinical Summary of Care: Patient Declined Electronic Signature(s) Signed: 11/14/2020 12:00:41 PM By: Baruch Gouty RN, BSN Entered By: Baruch Gouty on 11/14/2020 09:14:21 -------------------------------------------------------------------------------- Lower Extremity Assessment Details Patient Name: Date of Service: [name], [name] 11/14/2020 8:30 [name] M Medical Record Number: [id number] Patient Account Number: [id number] Date of Birth/Sex: Treating RN: [birth year] (70 y.o. [name] Primary Care Erasmo Vertz: Grier Mitts Other Clinician: Referring Siomara Burkel: Treating Earlyn Sylvan/Extender: Natalia Leatherwood Weeks in Treatment: 7 Edema Assessment Assessed: [Left: No] [Right: No] Edema: [Left: N] [Right: o] Calf Left: Right: Point of Measurement: 37 cm From Medial Instep 34 cm Ankle Left: Right: Point of Measurement: 12 cm From Medial Instep 21 cm Vascular Assessment Pulses: Dorsalis Pedis Palpable: [Right:Yes] Electronic Signature(s) Signed: 12/04/2020 4:03:25 PM By: Levan Hurst RN, BSN Entered By: Levan Hurst on 11/14/2020 08:26:09 -------------------------------------------------------------------------------- Multi-Disciplinary Care Plan Details Patient Name: Date of Service: [name], [name] 11/14/2020 8:30 [name] M Medical Record Number: [id number] Patient Account Number: [id number] Date of Birth/Sex: Treating RN: 1951-10-20 (70 y.o. [name] Primary Care Brexton Sofia: Grier Mitts Other Clinician: Referring Sreekar Broyhill: Treating Delila Kuklinski/Extender: Natalia Leatherwood Weeks in Treatment: 7 Active Inactive Nutrition Nursing Diagnoses: Impaired glucose control: actual or  potential Potential for alteratiion in Nutrition/Potential for imbalanced nutrition Goals: Patient/caregiver agrees to and verbalizes understanding of need to obtain nutritional consultation Date Initiated: 09/26/2020 Date Inactivated: 11/07/2020 Target Resolution Date: 10/26/2020 Goal Status: Met Patient/caregiver agrees to and verbalizes understanding of need to use nutritional supplements and/or vitamins as prescribed Date Initiated: 09/26/2020 Date Inactivated: 11/07/2020 Target Resolution Date: 10/25/2020 Goal Status: Met Patient/caregiver verbalizes understanding of need to maintain therapeutic glucose control per primary care physician  Date Initiated: 09/26/2020 Date Inactivated: 10/10/2020 Target Resolution Date: 10/12/2020 Goal Status: Met Patient/caregiver will maintain therapeutic glucose control Date Initiated: 11/07/2020 Target Resolution Date: 12/05/2020 Goal Status: Active Interventions: Assess HgA1c results as ordered upon admission and as needed Assess patient nutrition upon admission and as needed per policy Provide education on elevated blood sugars and impact on wound healing Provide education on nutrition Treatment Activities: Education provided on Nutrition : 09/26/2020 Notes: Wound/Skin Impairment Nursing Diagnoses: Impaired tissue integrity Knowledge deficit related to ulceration/compromised skin integrity Goals: Patient will have [name] decrease in wound volume by X% from date: (specify in notes) Date Initiated: 09/26/2020 Date Inactivated: 11/07/2020 Target Resolution Date: 10/26/2020 Goal Status: Met Patient/caregiver will verbalize understanding of skin care regimen Date Initiated: 09/26/2020 Target Resolution Date: 12/05/2020 Goal Status: Active Ulcer/skin breakdown will have [name] volume reduction of 30% by week 4 Date Initiated: 09/26/2020 Date Inactivated: 11/07/2020 Target Resolution Date: 10/26/2020 Goal Status: Met Ulcer/skin breakdown will have [name] volume  reduction of 50% by week 8 Date Initiated: 11/07/2020 Target Resolution Date: 12/05/2020 Goal Status: Active Interventions: Assess patient/caregiver ability to obtain necessary supplies Assess patient/caregiver ability to perform ulcer/skin care regimen upon admission and as needed Assess ulceration(s) every visit Provide education on ulcer and skin care Notes: Electronic Signature(s) Signed: 11/14/2020 12:00:41 PM By: Baruch Gouty RN, BSN Entered By: Baruch Gouty on 11/14/2020 09:03:09 -------------------------------------------------------------------------------- Pain Assessment Details Patient Name: Date of Service: [name], [name] 11/14/2020 8:30 [name] M Medical Record Number: [id number] Patient Account Number: [id number] Date of Birth/Sex: Treating RN: Nov 06, 1950 (70 y.o. [name] Primary Care Mishell Donalson: Grier Mitts Other Clinician: Referring Dianna Ewald: Treating Sami Froh/Extender: Natalia Leatherwood Weeks in Treatment: 7 Active Problems Location of Pain Severity and Description of Pain Patient Has Paino No Site Locations Pain Management and Medication Current Pain Management: Electronic Signature(s) Signed: 11/14/2020 10:10:32 AM By: Sandre Kitty Signed: 11/14/2020 12:00:41 PM By: Baruch Gouty RN, BSN Entered By: Sandre Kitty on 11/14/2020 08:21:12 -------------------------------------------------------------------------------- Patient/Caregiver Education Details Patient Name: Date of Service: [name], [name] 1/19/2022andnbsp8:30 [place] Record Number: [id number] Patient Account Number: [id number] Date of Birth/Gender: Treating RN: [birth year] (70 y.o. [name] Primary Care Physician: Grier Mitts Other Clinician: Referring Physician: Treating Physician/Extender: Katherine Basset in Treatment: 7 Education Assessment Education Provided To: Patient Education Topics Provided Elevated Blood  Sugar/ Impact on Healing: Methods: Explain/Verbal Responses: Reinforcements needed, State content correctly Wound/Skin Impairment: Methods: Explain/Verbal Responses: Reinforcements needed, State content correctly Electronic Signature(s) Signed: 11/14/2020 12:00:41 PM By: Baruch Gouty RN, BSN Entered By: Baruch Gouty on 11/14/2020 09:03:29 -------------------------------------------------------------------------------- Wound Assessment Details Patient Name: Date of Service: [name], [name] 11/14/2020 8:30 [name] M Medical Record Number: [id number] Patient Account Number: [id number] Date of Birth/Sex: Treating RN: 1951/10/19 (70 y.o. [name] Primary Care Rea Kalama: Grier Mitts Other Clinician: Referring Jarryd Gratz: Treating Teandra Harlan/Extender: Natalia Leatherwood Weeks in Treatment: 7 Wound Status Wound Number: 2 Primary Etiology: Diabetic Wound/Ulcer of the Lower Extremity Wound Location: Right, Plantar T Fifth oe Wound Status: Healed - Epithelialized Wounding Event: Blister Comorbid Hypertension, Type II Diabetes, Osteoarthritis, History: Neuropathy Date Acquired: 09/10/2020 Weeks Of Treatment: 7 Clustered Wound: No Photos Photo Uploaded By: Mikeal Hawthorne on 11/15/2020 12:51:29 Wound Measurements Length: (cm) Width: (cm) Depth: (cm) Area: (cm) Volume: (cm) 0 % Reduction in Area: 100% 0 % Reduction in Volume: 100% 0 Epithelialization: Large (67-100%) 0 Tunneling: No 0 Undermining: No Wound Description Classification: Grade 2 Wound Margin: Thickened Exudate Amount:  None Present Foul Odor After Cleansing: No Slough/Fibrino No Wound Bed Granulation Amount: None Present (0%) Exposed Structure Necrotic Amount: None Present (0%) Fascia Exposed: No Fat Layer (Subcutaneous Tissue) Exposed: No Tendon Exposed: No Muscle Exposed: No Joint Exposed: No Bone Exposed: No Electronic Signature(s) Signed: 11/14/2020 12:00:41 PM By: Baruch Gouty  RN, BSN Signed: 12/04/2020 4:03:25 PM By: Levan Hurst RN, BSN Entered By: Levan Hurst on 11/14/2020 08:26:27 -------------------------------------------------------------------------------- Wound Assessment Details Patient Name: Date of Service: [name], [name] 11/14/2020 8:30 [name] M Medical Record Number: [id number] Patient Account Number: [id number] Date of Birth/Sex: Treating RN: 1951-02-19 (70 y.o. [name] Primary Care Desirie Minteer: Grier Mitts Other Clinician: Referring Donshay Lupinski: Treating Maura Braaten/Extender: Natalia Leatherwood Weeks in Treatment: 7 Wound Status Wound Number: 4 Primary Etiology: Diabetic Wound/Ulcer of the Lower Extremity Wound Location: Right, Plantar T Great oe Wound Status: Open Wounding Event: Blister Comorbid Hypertension, Type II Diabetes, Osteoarthritis, History: Neuropathy Date Acquired: 09/10/2020 Weeks Of Treatment: 7 Clustered Wound: No Photos Photo Uploaded By: Mikeal Hawthorne on 11/15/2020 12:51:31 Wound Measurements Length: (cm) 0.5 Width: (cm) 0.2 Depth: (cm) 0.1 Area: (cm) 0.079 Volume: (cm) 0.008 % Reduction in Area: 80.3% % Reduction in Volume: 80% Epithelialization: Large (67-100%) Tunneling: No Undermining: No Wound Description Classification: Grade 2 Wound Margin: Flat and Intact Exudate Amount: Small Exudate Type: Serosanguineous Exudate Color: red, brown Foul Odor After Cleansing: No Slough/Fibrino No Wound Bed Granulation Amount: Large (67-100%) Exposed Structure Granulation Quality: Pink Fascia Exposed: No Necrotic Amount: None Present (0%) Fat Layer (Subcutaneous Tissue) Exposed: Yes Tendon Exposed: No Muscle Exposed: No Joint Exposed: No Bone Exposed: No Electronic Signature(s) Signed: 11/14/2020 12:00:41 PM By: Baruch Gouty RN, BSN Signed: 12/04/2020 4:03:25 PM By: Levan Hurst RN, BSN Entered By: Levan Hurst on 11/14/2020  08:27:29 -------------------------------------------------------------------------------- [place] Details Patient Name: Date of Service: [name], [name] 11/14/2020 8:30 [name] M Medical Record Number: [id number] Patient Account Number: [id number] Date of Birth/Sex: Treating RN: 09-27-1951 (70 y.o. [name] Primary Care Disa Riedlinger: Grier Mitts Other Clinician: Referring Espiridion Supinski: Treating Jiayi Lengacher/Extender: Natalia Leatherwood Weeks in Treatment: 7 Vital Signs Time Taken: 08:20 Temperature (F): 98.0 Height (in): 66 Pulse (bpm): 80 Weight (lbs): 159 Respiratory Rate (breaths/min): 16 Body Mass Index (BMI): 25.7 Blood Pressure (mmHg): 171/80 Capillary Blood Glucose (mg/dl): 156 Reference Range: 80 - 120 mg / dl Electronic Signature(s) Signed: 11/14/2020 10:10:32 AM By: Sandre Kitty Entered By: Sandre Kitty on 11/14/2020 08:21:04

## 2020-12-05 DIAGNOSIS — C61 Malignant neoplasm of prostate: Secondary | ICD-10-CM | POA: Diagnosis not present

## 2020-12-06 ENCOUNTER — Other Ambulatory Visit: Payer: Self-pay | Admitting: Family Medicine

## 2020-12-06 ENCOUNTER — Telehealth: Payer: Self-pay | Admitting: *Deleted

## 2020-12-06 DIAGNOSIS — I10 Essential (primary) hypertension: Secondary | ICD-10-CM

## 2020-12-06 NOTE — Telephone Encounter (Signed)
CALLED PATIENT TO REMIND OF SIM APPT. FOR 12-07-20- ARRIVAL TIME- 2:15 PM @ [facility], SPOKE WITH PATIENT AND HE IS AWARE OF THIS APPT.

## 2020-12-07 ENCOUNTER — Ambulatory Visit
Admission: RE | Admit: 2020-12-07 | Discharge: 2020-12-07 | Disposition: A | Discharge status: Home / Self Care | Payer: Medicare HMO | Source: Ambulatory Visit | Attending: Radiation Oncology | Admitting: Radiation Oncology

## 2020-12-07 ENCOUNTER — Other Ambulatory Visit: Payer: Self-pay

## 2020-12-07 DIAGNOSIS — C61 Malignant neoplasm of prostate: Secondary | ICD-10-CM | POA: Diagnosis not present

## 2020-12-07 DIAGNOSIS — Z510 Encounter for antineoplastic radiation therapy: Secondary | ICD-10-CM | POA: Insufficient documentation

## 2020-12-07 NOTE — Progress Notes (Signed)
  Radiation Oncology         [phone]) [phone] ________________________________  Name: [name] MRN: [id number]  Date: 12/07/2020  DOB: 1951/01/19  SIMULATION AND TREATMENT PLANNING NOTE  No diagnosis found.  DIAGNOSIS:  70 y.o. gentleman with Stage T1c adenocarcinoma of the prostate with Gleason Score of 3+4, and PSA of 6.49.  NARRATIVE:  The patient was brought to the [facility].  Identity was confirmed.  All relevant records and images related to the planned course of therapy were reviewed.  The patient freely provided informed written consent to proceed with treatment after reviewing the details related to the planned course of therapy. The consent form was witnessed and verified by the simulation staff.  Then, the patient was set-up in a stable reproducible supine position for radiation therapy.  A vacuum lock pillow device was custom fabricated to position his legs in a reproducible immobilized position.  Then, I performed a urethrogram under sterile conditions to identify the prostatic apex.  CT images were obtained.  Surface markings were placed.  The CT images were loaded into the planning software.  Then the prostate target and avoidance structures including the rectum, bladder, bowel and hips were contoured.  Treatment planning then occurred.  The radiation prescription was entered and confirmed.  A total of one complex treatment devices was fabricated. I have requested : Intensity Modulated Radiotherapy (IMRT) is medically necessary for this case for the following reason:  Rectal sparing.Marland Kitchen  PLAN:  The patient will receive 70 Gy in 28 fractions.  ________________________________  Sheral Apley Tammi Klippel, M.D.  This document serves as a record of services personally performed by Tyler Pita, MD. It was created on his behalf by Wilburn Mylar, a trained medical scribe. The creation of this record is based on the scribe's personal observations and the provider's  statements to them. This document has been checked and approved by the attending provider.

## 2020-12-10 DIAGNOSIS — C61 Malignant neoplasm of prostate: Secondary | ICD-10-CM | POA: Diagnosis not present

## 2020-12-10 DIAGNOSIS — Z510 Encounter for antineoplastic radiation therapy: Secondary | ICD-10-CM | POA: Diagnosis not present

## 2020-12-11 ENCOUNTER — Other Ambulatory Visit: Payer: Self-pay

## 2020-12-15 ENCOUNTER — Telehealth (HOSPITAL_COMMUNITY): Payer: Self-pay

## 2020-12-15 NOTE — Telephone Encounter (Signed)
Pt called to get verification on when he was seen in here at the urgent care.  Pt was informed that he was seen on 09/11/2020

## 2020-12-18 ENCOUNTER — Other Ambulatory Visit: Payer: Self-pay | Admitting: Family Medicine

## 2020-12-18 ENCOUNTER — Encounter: Payer: Self-pay | Admitting: Medical Oncology

## 2020-12-18 ENCOUNTER — Ambulatory Visit
Admission: RE | Admit: 2020-12-18 | Discharge: 2020-12-18 | Disposition: A | Discharge status: Home / Self Care | Payer: Medicare HMO | Source: Ambulatory Visit | Attending: Radiation Oncology | Admitting: Radiation Oncology

## 2020-12-18 ENCOUNTER — Other Ambulatory Visit: Payer: Self-pay

## 2020-12-18 DIAGNOSIS — C61 Malignant neoplasm of prostate: Secondary | ICD-10-CM

## 2020-12-18 DIAGNOSIS — E1169 Type 2 diabetes mellitus with other specified complication: Secondary | ICD-10-CM

## 2020-12-18 DIAGNOSIS — Z510 Encounter for antineoplastic radiation therapy: Secondary | ICD-10-CM | POA: Diagnosis not present

## 2020-12-19 ENCOUNTER — Ambulatory Visit
Admission: RE | Admit: 2020-12-19 | Discharge: 2020-12-19 | Disposition: A | Discharge status: Home / Self Care | Payer: Medicare HMO | Source: Ambulatory Visit | Attending: Radiation Oncology | Admitting: Radiation Oncology

## 2020-12-19 ENCOUNTER — Other Ambulatory Visit: Payer: Self-pay

## 2020-12-19 DIAGNOSIS — Z510 Encounter for antineoplastic radiation therapy: Secondary | ICD-10-CM | POA: Diagnosis not present

## 2020-12-19 DIAGNOSIS — C61 Malignant neoplasm of prostate: Secondary | ICD-10-CM | POA: Diagnosis not present

## 2020-12-20 ENCOUNTER — Other Ambulatory Visit: Payer: Self-pay

## 2020-12-20 ENCOUNTER — Ambulatory Visit
Admission: RE | Admit: 2020-12-20 | Discharge: 2020-12-20 | Disposition: A | Discharge status: Home / Self Care | Payer: Medicare HMO | Source: Ambulatory Visit | Attending: Radiation Oncology | Admitting: Radiation Oncology

## 2020-12-20 DIAGNOSIS — C61 Malignant neoplasm of prostate: Secondary | ICD-10-CM | POA: Diagnosis not present

## 2020-12-20 DIAGNOSIS — Z510 Encounter for antineoplastic radiation therapy: Secondary | ICD-10-CM | POA: Diagnosis not present

## 2020-12-21 ENCOUNTER — Ambulatory Visit
Admission: RE | Admit: 2020-12-21 | Discharge: 2020-12-21 | Disposition: A | Discharge status: Home / Self Care | Payer: Medicare HMO | Source: Ambulatory Visit | Attending: Radiation Oncology | Admitting: Radiation Oncology

## 2020-12-21 ENCOUNTER — Other Ambulatory Visit: Payer: Self-pay

## 2020-12-21 DIAGNOSIS — Z510 Encounter for antineoplastic radiation therapy: Secondary | ICD-10-CM | POA: Diagnosis not present

## 2020-12-21 DIAGNOSIS — C61 Malignant neoplasm of prostate: Secondary | ICD-10-CM

## 2020-12-21 NOTE — Progress Notes (Signed)
  Radiation Oncology         [phone]) [phone] ________________________________  Name: [name] MRN: [id number]  Date: 12/21/2020  DOB: 09-10-1951  COMPLEX SIMULATION NOTE  NARRATIVE:  The patient underwent repeat CT simulation today for ongoing radiation therapy.  The existing CT study set was notable for rectal distention, although it was not initially clear that this would be reproducible.  On set-up imaging, his rectum was no longer distended resulting in displacement of the Seminal vesicles outside of the PTV.  So, repeat imaging performed today, to re-run IMRT plan on more reproducible anatomy.   ------------------------------------------------  Sheral Apley Tammi Klippel, M.D.

## 2020-12-24 ENCOUNTER — Other Ambulatory Visit: Payer: Self-pay

## 2020-12-24 ENCOUNTER — Ambulatory Visit
Admission: RE | Admit: 2020-12-24 | Discharge: 2020-12-24 | Disposition: A | Discharge status: Home / Self Care | Payer: Medicare HMO | Source: Ambulatory Visit | Attending: Radiation Oncology | Admitting: Radiation Oncology

## 2020-12-24 ENCOUNTER — Ambulatory Visit (INDEPENDENT_AMBULATORY_CARE_PROVIDER_SITE_OTHER): Payer: Medicare HMO | Admitting: Family Medicine

## 2020-12-24 ENCOUNTER — Encounter: Payer: Self-pay | Admitting: Family Medicine

## 2020-12-24 VITALS — BP 135/90 | HR 74 | Temp 97.6°F | Wt 162.0 lb

## 2020-12-24 DIAGNOSIS — Z510 Encounter for antineoplastic radiation therapy: Secondary | ICD-10-CM | POA: Diagnosis not present

## 2020-12-24 DIAGNOSIS — R238 Other skin changes: Secondary | ICD-10-CM | POA: Diagnosis not present

## 2020-12-24 DIAGNOSIS — E113411 Type 2 diabetes mellitus with severe nonproliferative diabetic retinopathy with macular edema, right eye: Secondary | ICD-10-CM | POA: Diagnosis not present

## 2020-12-24 DIAGNOSIS — I10 Essential (primary) hypertension: Secondary | ICD-10-CM | POA: Diagnosis not present

## 2020-12-24 DIAGNOSIS — E782 Mixed hyperlipidemia: Secondary | ICD-10-CM

## 2020-12-24 DIAGNOSIS — C61 Malignant neoplasm of prostate: Secondary | ICD-10-CM | POA: Diagnosis not present

## 2020-12-24 NOTE — Progress Notes (Signed)
Subjective:    Patient ID: [name], male    DOB: 07/31/1951, 69 y.o.   MRN: [id number]  Chief Complaint  Patient presents with  . Follow-up    HPI Patient is a 70 year old male with past medical history significant for prostate cancer, HTN, DM 2, CKD who was seen for follow-up.  Patient recently started treatment for prostate cancer.  Seen by urology, Dr. Jeffie Pollock.  CT and bone scan negative for mets.  Has XRT daily (8am) x several weeks. States doing ok.  Taking it easy and worrying less about work has helped him sleep better.  Now sleeping at 11:30 PM and waking up at 4 or 5 AM.  Blood sugars 1 teens-145 with highest reading 200.  BP typically 1 teens-130s/67-90.  Patient denies headache, CP, changes in vision.  States feet finally healed 3 weeks ago and he is no longer seeing the podiatrist/wound care.  Patient inquires about having to continue cholesterol medication.  Denies myalgias or joint pain.  Past Medical History:  Diagnosis Date  . Diabetes mellitus without complication ([place])   . Insomnia   . Prostate cancer ([place])     No Known Allergies  ROS General: Denies fever, chills, night sweats, changes in weight, changes in appetite HEENT: Denies headaches, ear pain, changes in vision, rhinorrhea, sore throat CV: Denies CP, palpitations, SOB, orthopnea Pulm: Denies SOB, cough, wheezing GI: Denies abdominal pain, nausea, vomiting, diarrhea, constipation GU: Denies dysuria, hematuria, frequency Msk: Denies muscle cramps, joint pains Neuro: Denies weakness, numbness, tingling Skin: Denies rashes, bruising Psych: Denies depression, anxiety, hallucinations    Objective:    Blood pressure 135/90, pulse 74, temperature 97.6 F (36.4 C), temperature source Oral, weight 162 lb (73.5 kg), SpO2 97 %.   Gen. Pleasant, well-nourished, in no distress, normal affect  HEENT: [place]/AT, face symmetric, conjunctiva clear, no scleral icterus, PERRLA, EOMI, nares patent without drainage Lungs:  no accessory muscle use Cardiovascular: RRR, no peripheral edema Musculoskeletal: No cyanosis or clubbing, normal tone Neuro:  A&Ox3, CN II-XII intact, normal gait Skin:  Warm, no lesions/ rash   Wt Readings from Last 3 Encounters:  12/24/20 162 lb (73.5 kg)  11/13/20 159 lb (72.1 kg)  10/24/20 156 lb (70.8 kg)    Lab Results  Component Value Date   WBC 4.1 09/07/2020   HGB 12.2 (L) 09/07/2020   HCT 35.1 (L) 09/07/2020   PLT 196 09/07/2020   GLUCOSE 201 (H) 09/07/2020   CHOL 137 09/07/2020   TRIG 56 09/07/2020   HDL 53 09/07/2020   LDLCALC 70 09/07/2020   ALT 29 09/07/2020   AST 33 09/07/2020   NA 141 09/07/2020   K 4.2 09/07/2020   CL 106 09/07/2020   CREATININE 1.35 (H) 09/07/2020   BUN 22 09/07/2020   CO2 27 09/07/2020   TSH 1.01 09/07/2020   PSA 6.49 (H) 03/21/2020   HGBA1C 6.6 (H) 09/07/2020   MICROALBUR 13.6 (H) 11/10/2018    Assessment/Plan:  Essential hypertension -Improving -Home BP log reviewed -Continue current medications including losartan 100 mg, HCTZ 12.5 mg, and diltiazem LA 300 mg daily -Lifestyle modifications encouraged -Continue checking BP at home and keep a log to review to clinic  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 and 22 units p.m. -Continue checking FSBS at home and keep a log to bring with  you to clinic -Continue follow-up with ophthalmology -On ARB and statin -Continue monitoring feet daily.  Follow-up with wound clinic for recurrent blisters  Prostate cancer ([place]) -PSA 6.49 on 03/21/2020 -Stage T1c adenocarcinoma prostate with Gleason score 3+4 -Continue XRT daily -Continue follow-up with oncology and urology  Mixed hyperlipidemia -Controlled.  HDL 53, LDL 70, total cholesterol 137, triglycerides 56 on  09/07/2020 -Continue lifestyle modifications -Continue Lipitor 40 mg daily  Blisters of multiple sites -Blisters on bilateral feet healed -Monitor for recurrence -Patient encouraged to check feet daily -For continued or worsening symptoms follow-up with podiatry/wound care  F/u as needed in the next 3 months, sooner if needed  Grier Mitts, MD

## 2020-12-25 ENCOUNTER — Other Ambulatory Visit: Payer: Self-pay

## 2020-12-25 ENCOUNTER — Ambulatory Visit
Admission: RE | Admit: 2020-12-25 | Discharge: 2020-12-25 | Disposition: A | Discharge status: Home / Self Care | Payer: Medicare HMO | Source: Ambulatory Visit | Attending: Radiation Oncology | Admitting: Radiation Oncology

## 2020-12-25 DIAGNOSIS — C61 Malignant neoplasm of prostate: Secondary | ICD-10-CM | POA: Diagnosis not present

## 2020-12-25 DIAGNOSIS — Z510 Encounter for antineoplastic radiation therapy: Secondary | ICD-10-CM | POA: Diagnosis not present

## 2020-12-26 ENCOUNTER — Ambulatory Visit
Admission: RE | Admit: 2020-12-26 | Discharge: 2020-12-26 | Disposition: A | Discharge status: Home / Self Care | Payer: Medicare HMO | Source: Ambulatory Visit | Attending: Radiation Oncology | Admitting: Radiation Oncology

## 2020-12-26 ENCOUNTER — Other Ambulatory Visit: Payer: Self-pay

## 2020-12-26 DIAGNOSIS — Z510 Encounter for antineoplastic radiation therapy: Secondary | ICD-10-CM | POA: Diagnosis not present

## 2020-12-26 DIAGNOSIS — C61 Malignant neoplasm of prostate: Secondary | ICD-10-CM | POA: Diagnosis not present

## 2020-12-27 ENCOUNTER — Ambulatory Visit
Admission: RE | Admit: 2020-12-27 | Discharge: 2020-12-27 | Disposition: A | Discharge status: Home / Self Care | Payer: Medicare HMO | Source: Ambulatory Visit | Attending: Radiation Oncology | Admitting: Radiation Oncology

## 2020-12-27 ENCOUNTER — Other Ambulatory Visit: Payer: Self-pay

## 2020-12-27 DIAGNOSIS — Z510 Encounter for antineoplastic radiation therapy: Secondary | ICD-10-CM | POA: Diagnosis not present

## 2020-12-27 DIAGNOSIS — C61 Malignant neoplasm of prostate: Secondary | ICD-10-CM | POA: Diagnosis not present

## 2020-12-28 ENCOUNTER — Other Ambulatory Visit: Payer: Self-pay

## 2020-12-28 ENCOUNTER — Ambulatory Visit
Admission: RE | Admit: 2020-12-28 | Discharge: 2020-12-28 | Disposition: A | Discharge status: Home / Self Care | Payer: Medicare HMO | Source: Ambulatory Visit | Attending: Radiation Oncology | Admitting: Radiation Oncology

## 2020-12-28 DIAGNOSIS — Z510 Encounter for antineoplastic radiation therapy: Secondary | ICD-10-CM | POA: Diagnosis not present

## 2020-12-28 DIAGNOSIS — C61 Malignant neoplasm of prostate: Secondary | ICD-10-CM | POA: Diagnosis not present

## 2020-12-31 ENCOUNTER — Other Ambulatory Visit: Payer: Self-pay

## 2020-12-31 ENCOUNTER — Ambulatory Visit
Admission: RE | Admit: 2020-12-31 | Discharge: 2020-12-31 | Disposition: A | Discharge status: Home / Self Care | Payer: Medicare HMO | Source: Ambulatory Visit | Attending: Radiation Oncology | Admitting: Radiation Oncology

## 2020-12-31 DIAGNOSIS — Z510 Encounter for antineoplastic radiation therapy: Secondary | ICD-10-CM | POA: Diagnosis not present

## 2020-12-31 DIAGNOSIS — C61 Malignant neoplasm of prostate: Secondary | ICD-10-CM | POA: Diagnosis not present

## 2021-01-01 ENCOUNTER — Ambulatory Visit
Admission: RE | Admit: 2021-01-01 | Discharge: 2021-01-01 | Disposition: A | Discharge status: Home / Self Care | Payer: Medicare HMO | Source: Ambulatory Visit | Attending: Radiation Oncology | Admitting: Radiation Oncology

## 2021-01-01 ENCOUNTER — Other Ambulatory Visit: Payer: Self-pay

## 2021-01-01 DIAGNOSIS — Z510 Encounter for antineoplastic radiation therapy: Secondary | ICD-10-CM | POA: Diagnosis not present

## 2021-01-01 DIAGNOSIS — C61 Malignant neoplasm of prostate: Secondary | ICD-10-CM | POA: Diagnosis not present

## 2021-01-02 ENCOUNTER — Ambulatory Visit
Admission: RE | Admit: 2021-01-02 | Discharge: 2021-01-02 | Disposition: A | Discharge status: Home / Self Care | Payer: Medicare HMO | Source: Ambulatory Visit | Attending: Radiation Oncology | Admitting: Radiation Oncology

## 2021-01-02 ENCOUNTER — Other Ambulatory Visit: Payer: Self-pay

## 2021-01-02 DIAGNOSIS — Z510 Encounter for antineoplastic radiation therapy: Secondary | ICD-10-CM | POA: Diagnosis not present

## 2021-01-02 DIAGNOSIS — C61 Malignant neoplasm of prostate: Secondary | ICD-10-CM | POA: Diagnosis not present

## 2021-01-03 ENCOUNTER — Other Ambulatory Visit: Payer: Self-pay

## 2021-01-03 ENCOUNTER — Ambulatory Visit
Admission: RE | Admit: 2021-01-03 | Discharge: 2021-01-03 | Disposition: A | Discharge status: Home / Self Care | Payer: Medicare HMO | Source: Ambulatory Visit | Attending: Radiation Oncology | Admitting: Radiation Oncology

## 2021-01-03 DIAGNOSIS — C61 Malignant neoplasm of prostate: Secondary | ICD-10-CM | POA: Diagnosis not present

## 2021-01-03 DIAGNOSIS — Z510 Encounter for antineoplastic radiation therapy: Secondary | ICD-10-CM | POA: Diagnosis not present

## 2021-01-04 ENCOUNTER — Other Ambulatory Visit: Payer: Self-pay

## 2021-01-04 ENCOUNTER — Ambulatory Visit
Admission: RE | Admit: 2021-01-04 | Discharge: 2021-01-04 | Disposition: A | Discharge status: Home / Self Care | Payer: Medicare HMO | Source: Ambulatory Visit | Attending: Radiation Oncology | Admitting: Radiation Oncology

## 2021-01-04 DIAGNOSIS — C61 Malignant neoplasm of prostate: Secondary | ICD-10-CM | POA: Diagnosis not present

## 2021-01-04 DIAGNOSIS — Z510 Encounter for antineoplastic radiation therapy: Secondary | ICD-10-CM | POA: Diagnosis not present

## 2021-01-07 ENCOUNTER — Ambulatory Visit
Admission: RE | Admit: 2021-01-07 | Discharge: 2021-01-07 | Disposition: A | Discharge status: Home / Self Care | Payer: Medicare HMO | Source: Ambulatory Visit | Attending: Radiation Oncology | Admitting: Radiation Oncology

## 2021-01-07 ENCOUNTER — Other Ambulatory Visit: Payer: Self-pay

## 2021-01-07 ENCOUNTER — Ambulatory Visit: Payer: Medicare HMO

## 2021-01-07 DIAGNOSIS — C61 Malignant neoplasm of prostate: Secondary | ICD-10-CM | POA: Diagnosis not present

## 2021-01-07 DIAGNOSIS — Z510 Encounter for antineoplastic radiation therapy: Secondary | ICD-10-CM | POA: Diagnosis not present

## 2021-01-08 ENCOUNTER — Other Ambulatory Visit: Payer: Self-pay

## 2021-01-08 ENCOUNTER — Ambulatory Visit
Admission: RE | Admit: 2021-01-08 | Discharge: 2021-01-08 | Disposition: A | Discharge status: Home / Self Care | Payer: Medicare HMO | Source: Ambulatory Visit | Attending: Radiation Oncology | Admitting: Radiation Oncology

## 2021-01-08 ENCOUNTER — Ambulatory Visit: Payer: Medicare HMO

## 2021-01-08 DIAGNOSIS — C61 Malignant neoplasm of prostate: Secondary | ICD-10-CM | POA: Diagnosis not present

## 2021-01-08 DIAGNOSIS — Z510 Encounter for antineoplastic radiation therapy: Secondary | ICD-10-CM | POA: Diagnosis not present

## 2021-01-09 ENCOUNTER — Ambulatory Visit: Payer: Medicare HMO

## 2021-01-09 ENCOUNTER — Other Ambulatory Visit: Payer: Self-pay

## 2021-01-09 ENCOUNTER — Ambulatory Visit
Admission: RE | Admit: 2021-01-09 | Discharge: 2021-01-09 | Disposition: A | Discharge status: Home / Self Care | Payer: Medicare HMO | Source: Ambulatory Visit | Attending: Radiation Oncology | Admitting: Radiation Oncology

## 2021-01-09 DIAGNOSIS — C61 Malignant neoplasm of prostate: Secondary | ICD-10-CM | POA: Diagnosis not present

## 2021-01-09 DIAGNOSIS — Z510 Encounter for antineoplastic radiation therapy: Secondary | ICD-10-CM | POA: Diagnosis not present

## 2021-01-10 ENCOUNTER — Ambulatory Visit
Admission: RE | Admit: 2021-01-10 | Discharge: 2021-01-10 | Disposition: A | Discharge status: Home / Self Care | Payer: Medicare HMO | Source: Ambulatory Visit | Attending: Radiation Oncology | Admitting: Radiation Oncology

## 2021-01-10 ENCOUNTER — Ambulatory Visit: Payer: Medicare HMO

## 2021-01-10 DIAGNOSIS — Z510 Encounter for antineoplastic radiation therapy: Secondary | ICD-10-CM | POA: Diagnosis not present

## 2021-01-10 DIAGNOSIS — C61 Malignant neoplasm of prostate: Secondary | ICD-10-CM | POA: Diagnosis not present

## 2021-01-11 ENCOUNTER — Ambulatory Visit
Admission: RE | Admit: 2021-01-11 | Discharge: 2021-01-11 | Disposition: A | Discharge status: Home / Self Care | Payer: Medicare HMO | Source: Ambulatory Visit | Attending: Radiation Oncology | Admitting: Radiation Oncology

## 2021-01-11 ENCOUNTER — Ambulatory Visit: Payer: Medicare HMO

## 2021-01-11 ENCOUNTER — Other Ambulatory Visit: Payer: Self-pay

## 2021-01-11 DIAGNOSIS — Z510 Encounter for antineoplastic radiation therapy: Secondary | ICD-10-CM | POA: Diagnosis not present

## 2021-01-11 DIAGNOSIS — C61 Malignant neoplasm of prostate: Secondary | ICD-10-CM | POA: Diagnosis not present

## 2021-01-14 ENCOUNTER — Ambulatory Visit
Admission: RE | Admit: 2021-01-14 | Discharge: 2021-01-14 | Disposition: A | Discharge status: Home / Self Care | Payer: Medicare HMO | Source: Ambulatory Visit | Attending: Radiation Oncology | Admitting: Radiation Oncology

## 2021-01-14 ENCOUNTER — Other Ambulatory Visit: Payer: Self-pay

## 2021-01-14 DIAGNOSIS — Z510 Encounter for antineoplastic radiation therapy: Secondary | ICD-10-CM | POA: Diagnosis not present

## 2021-01-14 DIAGNOSIS — C61 Malignant neoplasm of prostate: Secondary | ICD-10-CM | POA: Diagnosis not present

## 2021-01-15 ENCOUNTER — Ambulatory Visit
Admission: RE | Admit: 2021-01-15 | Discharge: 2021-01-15 | Disposition: A | Discharge status: Home / Self Care | Payer: Medicare HMO | Source: Ambulatory Visit | Attending: Radiation Oncology | Admitting: Radiation Oncology

## 2021-01-15 DIAGNOSIS — Z510 Encounter for antineoplastic radiation therapy: Secondary | ICD-10-CM | POA: Diagnosis not present

## 2021-01-15 DIAGNOSIS — C61 Malignant neoplasm of prostate: Secondary | ICD-10-CM | POA: Diagnosis not present

## 2021-01-16 ENCOUNTER — Ambulatory Visit
Admission: RE | Admit: 2021-01-16 | Discharge: 2021-01-16 | Disposition: A | Discharge status: Home / Self Care | Payer: Medicare HMO | Source: Ambulatory Visit | Attending: Radiation Oncology | Admitting: Radiation Oncology

## 2021-01-16 ENCOUNTER — Other Ambulatory Visit: Payer: Self-pay

## 2021-01-16 DIAGNOSIS — Z510 Encounter for antineoplastic radiation therapy: Secondary | ICD-10-CM | POA: Diagnosis not present

## 2021-01-16 DIAGNOSIS — C61 Malignant neoplasm of prostate: Secondary | ICD-10-CM | POA: Diagnosis not present

## 2021-01-17 ENCOUNTER — Ambulatory Visit
Admission: RE | Admit: 2021-01-17 | Discharge: 2021-01-17 | Disposition: A | Discharge status: Home / Self Care | Payer: Medicare HMO | Source: Ambulatory Visit | Attending: Radiation Oncology | Admitting: Radiation Oncology

## 2021-01-17 DIAGNOSIS — C61 Malignant neoplasm of prostate: Secondary | ICD-10-CM | POA: Diagnosis not present

## 2021-01-17 DIAGNOSIS — Z510 Encounter for antineoplastic radiation therapy: Secondary | ICD-10-CM | POA: Diagnosis not present

## 2021-01-18 ENCOUNTER — Other Ambulatory Visit: Payer: Self-pay

## 2021-01-18 ENCOUNTER — Ambulatory Visit
Admission: RE | Admit: 2021-01-18 | Discharge: 2021-01-18 | Disposition: A | Discharge status: Home / Self Care | Payer: Medicare HMO | Source: Ambulatory Visit | Attending: Radiation Oncology | Admitting: Radiation Oncology

## 2021-01-18 DIAGNOSIS — C61 Malignant neoplasm of prostate: Secondary | ICD-10-CM | POA: Diagnosis not present

## 2021-01-18 DIAGNOSIS — Z510 Encounter for antineoplastic radiation therapy: Secondary | ICD-10-CM | POA: Diagnosis not present

## 2021-01-21 ENCOUNTER — Other Ambulatory Visit: Payer: Self-pay

## 2021-01-21 ENCOUNTER — Ambulatory Visit
Admission: RE | Admit: 2021-01-21 | Discharge: 2021-01-21 | Disposition: A | Discharge status: Home / Self Care | Payer: Medicare HMO | Source: Ambulatory Visit | Attending: Radiation Oncology | Admitting: Radiation Oncology

## 2021-01-21 ENCOUNTER — Ambulatory Visit: Payer: Medicare HMO

## 2021-01-21 DIAGNOSIS — C61 Malignant neoplasm of prostate: Secondary | ICD-10-CM | POA: Diagnosis not present

## 2021-01-21 DIAGNOSIS — Z510 Encounter for antineoplastic radiation therapy: Secondary | ICD-10-CM | POA: Diagnosis not present

## 2021-01-22 ENCOUNTER — Ambulatory Visit: Payer: Medicare HMO

## 2021-01-22 ENCOUNTER — Ambulatory Visit
Admission: RE | Admit: 2021-01-22 | Discharge: 2021-01-22 | Disposition: A | Discharge status: Home / Self Care | Payer: Medicare HMO | Source: Ambulatory Visit | Attending: Radiation Oncology | Admitting: Radiation Oncology

## 2021-01-22 DIAGNOSIS — Z510 Encounter for antineoplastic radiation therapy: Secondary | ICD-10-CM | POA: Diagnosis not present

## 2021-01-22 DIAGNOSIS — C61 Malignant neoplasm of prostate: Secondary | ICD-10-CM | POA: Diagnosis not present

## 2021-01-23 ENCOUNTER — Other Ambulatory Visit: Payer: Self-pay

## 2021-01-23 ENCOUNTER — Ambulatory Visit: Payer: Medicare HMO

## 2021-01-23 ENCOUNTER — Ambulatory Visit: Admission: RE | Admit: 2021-01-23 | Payer: Medicare HMO | Source: Ambulatory Visit

## 2021-01-24 ENCOUNTER — Ambulatory Visit: Payer: Medicare HMO

## 2021-01-24 ENCOUNTER — Ambulatory Visit
Admission: RE | Admit: 2021-01-24 | Discharge: 2021-01-24 | Disposition: A | Discharge status: Home / Self Care | Payer: Medicare HMO | Source: Ambulatory Visit | Attending: Radiation Oncology | Admitting: Radiation Oncology

## 2021-01-24 DIAGNOSIS — C61 Malignant neoplasm of prostate: Secondary | ICD-10-CM | POA: Diagnosis not present

## 2021-01-24 DIAGNOSIS — Z510 Encounter for antineoplastic radiation therapy: Secondary | ICD-10-CM | POA: Diagnosis not present

## 2021-01-25 ENCOUNTER — Encounter: Payer: Self-pay | Admitting: Urology

## 2021-01-25 ENCOUNTER — Ambulatory Visit
Admission: RE | Admit: 2021-01-25 | Discharge: 2021-01-25 | Disposition: A | Discharge status: Home / Self Care | Payer: Medicare HMO | Source: Ambulatory Visit | Attending: Radiation Oncology | Admitting: Radiation Oncology

## 2021-01-25 ENCOUNTER — Other Ambulatory Visit: Payer: Self-pay

## 2021-01-25 DIAGNOSIS — C61 Malignant neoplasm of prostate: Secondary | ICD-10-CM | POA: Diagnosis not present

## 2021-01-25 DIAGNOSIS — Z510 Encounter for antineoplastic radiation therapy: Secondary | ICD-10-CM | POA: Diagnosis not present

## 2021-02-05 ENCOUNTER — Telehealth: Payer: Self-pay | Admitting: Family Medicine

## 2021-02-05 NOTE — Telephone Encounter (Signed)
Left message for patient to call back and schedule Medicare Annual Wellness Visit (AWV) either virtually or in office. No detailed message left    AWV-I PER [place] 05/27/18 please schedule at anytime with [facility] Nurse Health Advisor 1 or 2   This should be a 45 minute visit.

## 2021-02-18 ENCOUNTER — Telehealth: Payer: Self-pay

## 2021-02-18 NOTE — Telephone Encounter (Signed)
Called patient in regards to telephone visit appointment with [name] Caldron PA on 02/27/21 @ 9:00am. Could not leave a voicemail message. Called to review meaningful use, AUA and prostate questions. TM

## 2021-02-21 NOTE — Progress Notes (Signed)
Patient denies any dysuria or hematuria.Patient reports nocturia x3. Patient denies any leakage . Reports some urgency. Patient states that he  Empties his bladder with urination.Patient reports a strong stream . Patient states that he has to strain sometime during urination. Patient states that he has a straight stream.Patient denies any issues with her bowels . Patient states that he does not have a follow-up appointment with his urologist.States that he  Will call for a  Follow-up appointment.Ipps was a 7.

## 2021-02-24 ENCOUNTER — Emergency Department (HOSPITAL_BASED_OUTPATIENT_CLINIC_OR_DEPARTMENT_OTHER)
Admission: EM | Admit: 2021-02-24 | Discharge: 2021-02-24 | Disposition: A | Discharge status: Home / Self Care | Payer: Medicare HMO | Attending: Emergency Medicine | Admitting: Emergency Medicine

## 2021-02-24 ENCOUNTER — Other Ambulatory Visit: Payer: Self-pay

## 2021-02-24 DIAGNOSIS — R112 Nausea with vomiting, unspecified: Secondary | ICD-10-CM

## 2021-02-24 DIAGNOSIS — R197 Diarrhea, unspecified: Secondary | ICD-10-CM | POA: Insufficient documentation

## 2021-02-24 DIAGNOSIS — I10 Essential (primary) hypertension: Secondary | ICD-10-CM | POA: Insufficient documentation

## 2021-02-24 DIAGNOSIS — E1142 Type 2 diabetes mellitus with diabetic polyneuropathy: Secondary | ICD-10-CM | POA: Insufficient documentation

## 2021-02-24 DIAGNOSIS — Z794 Long term (current) use of insulin: Secondary | ICD-10-CM | POA: Diagnosis not present

## 2021-02-24 DIAGNOSIS — Z79899 Other long term (current) drug therapy: Secondary | ICD-10-CM | POA: Insufficient documentation

## 2021-02-24 DIAGNOSIS — Z8546 Personal history of malignant neoplasm of prostate: Secondary | ICD-10-CM | POA: Diagnosis not present

## 2021-02-24 LAB — COMPREHENSIVE METABOLIC PANEL
ALT: 28 U/L (ref 0–44)
AST: 34 U/L (ref 15–41)
Albumin: 4 g/dL (ref 3.5–5.0)
Alkaline Phosphatase: 72 U/L (ref 38–126)
Anion gap: 11 (ref 5–15)
BUN: 44 mg/dL — ABNORMAL HIGH (ref 8–23)
CO2: 20 mmol/L — ABNORMAL LOW (ref 22–32)
Calcium: 9.1 mg/dL (ref 8.9–10.3)
Chloride: 109 mmol/L (ref 98–111)
Creatinine, Ser: 2.11 mg/dL — ABNORMAL HIGH (ref 0.61–1.24)
GFR, Estimated: 33 mL/min — ABNORMAL LOW (ref >60)
Glucose, Bld: 171 mg/dL — ABNORMAL HIGH (ref 70–99)
Potassium: 4 mmol/L (ref 3.5–5.1)
Sodium: 140 mmol/L (ref 135–145)
Total Bilirubin: 0.4 mg/dL (ref 0.3–1.2)
Total Protein: 7.1 g/dL (ref 6.5–8.1)

## 2021-02-24 LAB — CBC WITH DIFFERENTIAL/PLATELET
Abs Immature Granulocytes: 0.02 10*3/uL (ref 0.00–0.07)
Basophils Absolute: 0 10*3/uL (ref 0.0–0.1)
Basophils Relative: 0 %
Eosinophils Absolute: 0 10*3/uL (ref 0.0–0.5)
Eosinophils Relative: 1 %
HCT: 35.3 % — ABNORMAL LOW (ref 39.0–52.0)
Hemoglobin: 12.5 g/dL — ABNORMAL LOW (ref 13.0–17.0)
Immature Granulocytes: 1 %
Lymphocytes Relative: 4 %
Lymphs Abs: 0.2 10*3/uL — ABNORMAL LOW (ref 0.7–4.0)
MCH: 31.6 pg (ref 26.0–34.0)
MCHC: 35.4 g/dL (ref 30.0–36.0)
MCV: 89.4 fL (ref 80.0–100.0)
Monocytes Absolute: 0.4 10*3/uL (ref 0.1–1.0)
Monocytes Relative: 9 %
Neutro Abs: 3.4 10*3/uL (ref 1.7–7.7)
Neutrophils Relative %: 85 %
Platelets: 191 10*3/uL (ref 150–400)
RBC: 3.95 MIL/uL — ABNORMAL LOW (ref 4.22–5.81)
RDW: 12.2 % (ref 11.5–15.5)
WBC: 4 10*3/uL (ref 4.0–10.5)
nRBC: 0 % (ref 0.0–0.2)

## 2021-02-24 LAB — LIPASE, BLOOD: Lipase: <10 U/L — ABNORMAL LOW (ref 11–51)

## 2021-02-24 MED ORDER — ONDANSETRON 4 MG PO TBDP: ORAL_TABLET | ORAL | 0 refills | Status: DC

## 2021-02-24 MED ORDER — ONDANSETRON HCL 4 MG/2ML IJ SOLN
4.0000 mg | Freq: Once | INTRAMUSCULAR | Status: AC
  Administered 2021-02-24: 4 mg via INTRAVENOUS
  Filled 2021-02-24: qty 2

## 2021-02-24 MED ORDER — SODIUM CHLORIDE 0.9 % IV BOLUS
1000.0000 mL | Freq: Once | INTRAVENOUS | Status: AC
  Administered 2021-02-24: 1000 mL via INTRAVENOUS

## 2021-02-24 MED ORDER — MORPHINE SULFATE (PF) 2 MG/ML IV SOLN
2.0000 mg | Freq: Once | INTRAVENOUS | Status: AC
  Administered 2021-02-24: 2 mg via INTRAVENOUS
  Filled 2021-02-24: qty 1

## 2021-02-24 MED ORDER — ACETAMINOPHEN 500 MG PO TABS
1000.0000 mg | ORAL_TABLET | Freq: Once | ORAL | Status: AC
  Administered 2021-02-24: 1000 mg via ORAL
  Filled 2021-02-24: qty 2

## 2021-02-24 NOTE — Discharge Instructions (Signed)
Take imodium for diarrhea, return for pain, inability to eat or drink.

## 2021-02-24 NOTE — ED Triage Notes (Signed)
Pt states he has had N/V/D since yesterday. Pt also states he has felt dizzy from the N/V.  Pt has not been able to hold any PO fluids or food down. Denies any abd pain. Appx. 5-6 episodes of emesis today.

## 2021-02-24 NOTE — ED Notes (Signed)
Pt verbalizes understanding of discharge instructions. Opportunity for questioning and answers were provided. Armand removed by staff, pt discharged from ED to home. Instructed to pick up Rx.  

## 2021-02-24 NOTE — ED Provider Notes (Signed)
[facility] EMERGENCY DEPT Provider Note   CSN: [id number] Arrival date & time: 02/24/21  1946     History Chief Complaint  Patient presents with  . Emesis  . Nausea  . Diarrhea    [name] is a 70 y.o. male.  70 yo M with a chief complaints of nausea vomiting and diarrhea.  Started yesterday.  Sick contacts in the family with similar illness.  No abdominal pain.  Is gotten mildly better today.  No noted fevers at home but was febrile here.  None dark or bloody emesis no dark or bloody stool.  The history is provided by the patient.  Emesis Associated symptoms: diarrhea   Associated symptoms: no abdominal pain, no arthralgias, no chills, no fever, no headaches and no myalgias   Diarrhea Associated symptoms: vomiting   Associated symptoms: no abdominal pain, no arthralgias, no chills, no fever, no headaches and no myalgias   Illness Severity:  Severe Onset quality:  Sudden Duration:  1 day Timing:  Constant Progression:  Worsening Chronicity:  New Associated symptoms: diarrhea and vomiting   Associated symptoms: no abdominal pain, no chest pain, no congestion, no fever, no headaches, no myalgias, no rash and no shortness of breath        Past Medical History:  Diagnosis Date  . Diabetes mellitus without complication ([place])   . Insomnia   . Prostate cancer [facility])     Patient Active Problem List   Diagnosis Date Noted  . Delayed sleep phase syndrome 11/13/2020  . Prostate cancer ([place]) 09/16/2020  . Severe nonproliferative diabetic retinopathy of right eye with macular edema associated with type 2 diabetes mellitus ([place]) 08/28/2020  . Age-related nuclear cataract, bilateral 08/28/2020  . Hypertensive retinopathy of both eyes 08/28/2020  . Bilateral carotid artery stenosis 08/28/2020  . Murmur 08/28/2020  . Diabetic macular edema ([place]) 07/13/2020  . Mild nonproliferative diabetic retinopathy of right eye with macular edema associated with type 2  diabetes mellitus ([place]) 07/13/2020  . BPH associated with nocturia 02/21/2020  . Type 2 diabetes mellitus with diabetic polyneuropathy, without long-term current use of insulin ([place]) 07/06/2019  . Dyslipidemia 07/06/2019  . ED (erectile dysfunction) 12/24/2018  . Osteoarthritis 11/25/2018  . Uncontrolled type 2 diabetes mellitus with hyperglycemia ([place]) 11/12/2018  . Hyperlipidemia associated with type 2 diabetes mellitus ([place]) 11/12/2018  . Essential hypertension 11/10/2018  . Insomnia 11/10/2018  . Diabetes ([place]) 01/23/2016  . Depression (emotion) 01/22/2016    Past Surgical History:  Procedure Laterality Date  . PROSTATE BIOPSY         Family History  Problem Relation Age of Onset  . Colon cancer Mother   . Diabetes Brother   . Breast cancer Neg Hx   . Pancreatic cancer Neg Hx   . Prostate cancer Neg Hx     Social History   Tobacco Use  . Smoking status: Never Smoker  . Smokeless tobacco: Never Used  Vaping Use  . Vaping Use: Never used  Substance Use Topics  . Alcohol use: No  . Drug use: No    Home Medications Prior to Admission medications   Medication Sig Start Date End Date Taking? Authorizing Provider  ondansetron (ZOFRAN ODT) 4 MG disintegrating tablet 80m ODT q4 hours prn nausea/vomit 02/24/21  Yes FTyrone Nine Ansen Sayegh, DO  atorvastatin (LIPITOR) 40 MG tablet TAKE 1 TABLET BY MOUTH EVERY DAY 12/18/20   BBillie Ruddy MD  blood glucose meter kit and supplies Dispense based on patient and insurance preference.  Use to check glucose daily (FOR ICD-10 E10.9, E11.9). 11/12/18   Luetta Nutting, DO  Continuous Blood Gluc Receiver (DEXCOM G6 RECEIVER) DEVI 1 Device by Does not apply route as directed. 03/22/20   Shamleffer, Melanie Crazier, MD  Continuous Blood Gluc Sensor (DEXCOM G6 SENSOR) MISC 1 Device by Does not apply route as directed. 03/22/20   Shamleffer, Melanie Crazier, MD  Continuous Blood Gluc Transmit (DEXCOM G6 TRANSMITTER) MISC 1 Device by Does not apply route  as directed. 03/22/20   Shamleffer, Melanie Crazier, MD  hydrochlorothiazide (HYDRODIURIL) 12.5 MG tablet Take 1 tablet (12.5 mg total) by mouth daily. 09/24/20   [name], MD  insulin isophane & regular human (NOVOLIN 70/30 FLEXPEN) (70-30) 100 UNIT/ML KwikPen Inject 20 Units into the skin daily before breakfast AND 22 Units at bedtime. 08/30/20   [name], MD  Insulin Pen Needle 31G X 8 MM MISC Use as directed for checking blood sugar 3 times daily, more frequently for hyper or hypoglycemia. 06/18/20   [name], MD  Lancet Devices (ONE TOUCH DELICA LANCING DEV) MISC 1 Device by Does not apply route as directed. 07/06/19   Shamleffer, Melanie Crazier, MD  latanoprost (XALATAN) 0.005 % ophthalmic solution SMARTSIG:In Eye(s) 11/07/20   [provider]  losartan (COZAAR) 100 MG tablet TAKE 1 TABLET BY MOUTH EVERY DAY 10/02/20   [name], MD  MATZIM LA 300 MG 24 hr tablet TAKE 1 TABLET BY MOUTH EVERY DAY 12/06/20   [name], MD  omeprazole (PRILOSEC) 40 MG capsule TAKE 1 CAPSULE BY MOUTH EVERY DAY Patient not taking: Reported on 02/21/2021 06/16/20   [name], MD  OneTouch Delica Lancets 12Y MISC 1 Device by Does not apply route as directed. Use to test up to 4x daily. 08/04/19   Luetta Nutting, DO  ONETOUCH ULTRA test strip USE UP TO 4X DAILY 07/03/20   Luetta Nutting, DO    Allergies    Patient has no known allergies.  Review of Systems   Review of Systems  Constitutional: Negative for chills and fever.  HENT: Negative for congestion and facial swelling.   Eyes: Negative for discharge and visual disturbance.  Respiratory: Negative for shortness of breath.   Cardiovascular: Negative for chest pain and palpitations.  Gastrointestinal: Positive for diarrhea and vomiting. Negative for abdominal pain.  Musculoskeletal: Negative for arthralgias and myalgias.  Skin: Negative for color change and rash.  Neurological: Negative for tremors, syncope and  headaches.  Psychiatric/Behavioral: Negative for confusion and dysphoric mood.    Physical Exam Updated Vital Signs BP (!) 143/78   Pulse 98   Temp (!) 100.7 F (38.2 C) (Oral)   Resp 17   Ht 5' 6"  (1.676 m)   Wt 71.6 kg   SpO2 94%   BMI 25.48 kg/m   Physical Exam Vitals and nursing note reviewed.  Constitutional:      Appearance: He is well-developed.  HENT:     Head: Normocephalic and atraumatic.  Eyes:     Pupils: Pupils are equal, round, and reactive to light.  Neck:     Vascular: No JVD.  Cardiovascular:     Rate and Rhythm: Normal rate and regular rhythm.     Heart sounds: No murmur heard. No friction rub. No gallop.   Pulmonary:     Effort: No respiratory distress.     Breath sounds: No wheezing.  Abdominal:     General: There is no distension.  Tenderness: There is no abdominal tenderness. There is no guarding or rebound.  Musculoskeletal:        General: Normal range of motion.     Cervical back: Normal range of motion and neck supple.  Skin:    Coloration: Skin is not pale.     Findings: No rash.  Neurological:     Mental Status: He is alert and oriented to person, place, and time.  Psychiatric:        Behavior: Behavior normal.     ED Results / Procedures / Treatments   Labs (all labs ordered are listed, but only abnormal results are displayed) Labs Reviewed  CBC WITH DIFFERENTIAL/PLATELET - Abnormal; Notable for the following components:      Result Value   RBC 3.95 (*)    Hemoglobin 12.5 (*)    HCT 35.3 (*)    Lymphs Abs 0.2 (*)    All other components within normal limits  COMPREHENSIVE METABOLIC PANEL - Abnormal; Notable for the following components:   CO2 20 (*)    Glucose, Bld 171 (*)    BUN 44 (*)    Creatinine, Ser 2.11 (*)    GFR, Estimated 33 (*)    All other components within normal limits  LIPASE, BLOOD - Abnormal; Notable for the following components:   Lipase <10 (*)    All other components within normal limits     EKG None  Radiology No results found.  Procedures Procedures   Medications Ordered in ED Medications  sodium chloride 0.9 % bolus 1,000 mL (0 mLs Intravenous Stopped 02/24/21 2140)  ondansetron (ZOFRAN) injection 4 mg (4 mg Intravenous Given 02/24/21 2038)  morphine 2 MG/ML injection 2 mg (2 mg Intravenous Given 02/24/21 2039)  acetaminophen (TYLENOL) tablet 1,000 mg (1,000 mg Oral Given 02/24/21 2038)    ED Course  I have reviewed the triage vital signs and the nursing notes.  Pertinent labs & imaging results that were available during my care of the patient were reviewed by me and considered in my medical decision making (see chart for details).    MDM Rules/Calculators/A&P                          70 yo M with a chief complaints of nausea vomiting and diarrhea.  Started yesterday.  Mildly improving.  No obvious tenderness on exam.  Will obtain a laboratory evaluation treat symptoms.  Reassess.  Patient feeling better on reassessment.  No significant LFT elevation lipase is normal.  Able to tolerate by mouth.  Will discharge home.  PCP follow-up.  9:42 PM:  I have discussed the diagnosis/risks/treatment options with the patient and believe the pt to be eligible for discharge home to follow-up with PCP. We also discussed returning to the ED immediately if new or worsening sx occur. We discussed the sx which are most concerning (e.g., sudden worsening pain, fever, inability to tolerate by mouth) that necessitate immediate return. Medications administered to the patient during their visit and any new prescriptions provided to the patient are listed below.  Medications given during this visit Medications  sodium chloride 0.9 % bolus 1,000 mL (0 mLs Intravenous Stopped 02/24/21 2140)  ondansetron (ZOFRAN) injection 4 mg (4 mg Intravenous Given 02/24/21 2038)  morphine 2 MG/ML injection 2 mg (2 mg Intravenous Given 02/24/21 2039)  acetaminophen (TYLENOL) tablet 1,000 mg (1,000 mg Oral Given  02/24/21 2038)     The patient appears reasonably screen and/or  stabilized for discharge and I doubt any other medical condition or other [facility] requiring further screening, evaluation, or treatment in the ED at this time prior to discharge.   Final Clinical Impression(s) / ED Diagnoses Final diagnoses:  Nausea vomiting and diarrhea    Rx / DC Orders ED Discharge Orders         Ordered    ondansetron (ZOFRAN ODT) 4 MG disintegrating tablet        02/24/21 2135           Deno Etienne, DO 02/24/21 2142

## 2021-02-26 ENCOUNTER — Telehealth: Payer: Self-pay

## 2021-02-26 NOTE — Telephone Encounter (Signed)
Spoke with patient via phone as reminder call. Patient will have a one month follow up call with Ashlyn Bruning tomorrow, advised she would call him at 9 am he does not have to come into the department.

## 2021-02-27 ENCOUNTER — Ambulatory Visit
Admission: RE | Admit: 2021-02-27 | Discharge: 2021-02-27 | Disposition: A | Discharge status: Home / Self Care | Payer: Medicare HMO | Source: Ambulatory Visit | Attending: Urology | Admitting: Urology

## 2021-02-27 ENCOUNTER — Other Ambulatory Visit: Payer: Self-pay

## 2021-02-27 DIAGNOSIS — C61 Malignant neoplasm of prostate: Secondary | ICD-10-CM

## 2021-02-27 NOTE — Progress Notes (Signed)
  Radiation Oncology         [phone]) [phone] ________________________________  Name: [name] MRN: [id number]  Date: 01/25/2021  DOB: 1951/08/06  End of Treatment Note  Diagnosis:   70 y.o. gentleman with Stage T1c adenocarcinoma of the prostate with Gleason score of 3+4, and PSA of 6.49.     Indication for treatment:  Curative, Definitive Radiotherapy       Radiation treatment dates:   12/18/20 - 01/25/21  Site/dose:   The prostate was treated to 70 Gy in 28 fractions of 2.5 Gy  Beams/energy:   The patient was treated with IMRT using volumetric arc therapy delivering 6 MV X-rays to clockwise and counterclockwise circumferential arcs with a 90 degree collimator offset to avoid dose scalloping.  Image guidance was performed with daily cone beam CT prior to each fraction to align to gold markers in the prostate and assure proper bladder and rectal fill volumes.  Immobilization was achieved with BodyFix custom mold.  Narrative: The patient tolerated radiation treatment relatively well with only minor urinary irritation and modest fatigue.  He reported nocturia 2-3 times per night, hesitancy, and a mildly weakened stream but felt like he emptied his bladder well on voiding.  He specifically denied gross hematuria, dysuria, straining to void, incomplete bladder emptying or incontinence.  He did not experience any abdominal pain or bowel issues.  Plan: The patient has completed radiation treatment. He will return to radiation oncology clinic for routine followup in one month. I advised him to call or return sooner if he has any questions or concerns related to his recovery or treatment. ________________________________  Sheral Apley. Tammi Klippel, M.D.

## 2021-02-27 NOTE — Progress Notes (Signed)
Radiation Oncology         [phone] ________________________________  Name: [name] MRN: [id number]  Date: 02/27/2021  DOB: [birth year]  Post Treatment Note  CC: [name], MD  Irine Seal, MD  Diagnosis:   70 y.o. gentleman with Stage T1c adenocarcinoma of the prostate with Gleason score of 3+4, and PSA of 6.49.  Interval Since Last Radiation:  4.5 weeks  12/18/20 - 01/25/21:  The prostate was treated to 70 Gy in 28 fractions of 2.5 Gy  Narrative:  I spoke with the patient to conduct his routine scheduled 1 month follow up visit via telephone to spare the patient unnecessary potential exposure in the healthcare setting during the current COVID-19 pandemic.  The patient was notified in advance and gave permission to proceed with this visit format.   He tolerated radiation treatment relatively well with only minor urinary irritation and modest fatigue.  He reported nocturia 2-3 times per night, hesitancy, and a mildly weakened stream but felt like he emptied his bladder well on voiding.  He specifically denied gross hematuria, dysuria, straining to void, incomplete bladder emptying or incontinence.  He did not experience any abdominal pain or bowel issues.                              On review of systems, the patient states that he is doing quite well in general.  He continues with nocturia 2-3 times per night, hesitancy, urgency and weak stream but feels that he is emptying his bladder well.  He specifically denies dysuria, gross hematuria, straining to void or incontinence.  He reports a healthy appetite and is maintaining his weight.  He denies any significant fatigue and overall, is quite pleased with his progress to date.  ALLERGIES:  has No Known Allergies.  Meds: Current Outpatient Medications  Medication Sig Dispense Refill  . atorvastatin (LIPITOR) 40 MG tablet TAKE 1 TABLET BY MOUTH EVERY DAY 90 tablet 1  . blood glucose meter kit and supplies Dispense based on  patient and insurance preference. Use to check glucose daily (FOR ICD-10 E10.9, E11.9). 1 each 0  . Continuous Blood Gluc Receiver (DEXCOM G6 RECEIVER) DEVI 1 Device by Does not apply route as directed. 1 each 0  . Continuous Blood Gluc Sensor (DEXCOM G6 SENSOR) MISC 1 Device by Does not apply route as directed. 9 each 3  . Continuous Blood Gluc Transmit (DEXCOM G6 TRANSMITTER) MISC 1 Device by Does not apply route as directed. 1 each 3  . hydrochlorothiazide (HYDRODIURIL) 12.5 MG tablet Take 1 tablet (12.5 mg total) by mouth daily. 30 tablet 3  . insulin isophane & regular human (NOVOLIN 70/30 FLEXPEN) (70-30) 100 UNIT/ML KwikPen Inject 20 Units into the skin daily before breakfast AND 22 Units at bedtime. 15 mL 11  . Insulin Pen Needle 31G X 8 MM MISC Use as directed for checking blood sugar 3 times daily, more frequently for hyper or hypoglycemia. 100 each 3  . Lancet Devices (ONE TOUCH DELICA LANCING DEV) MISC 1 Device by Does not apply route as directed. 1 each 0  . latanoprost (XALATAN) 0.005 % ophthalmic solution SMARTSIG:In Eye(s)    . losartan (COZAAR) 100 MG tablet TAKE 1 TABLET BY MOUTH EVERY DAY 90 tablet 1  . MATZIM LA 300 MG 24 hr tablet TAKE 1 TABLET BY MOUTH EVERY DAY 30 tablet 3  . OneTouch Delica Lancets 93J MISC 1 Device by Does  not apply route as directed. Use to test up to 4x daily. 400 each 1  . ONETOUCH ULTRA test strip USE UP TO 4X DAILY 300 strip 2  . omeprazole (PRILOSEC) 40 MG capsule TAKE 1 CAPSULE BY MOUTH EVERY DAY (Patient not taking: Reported on 02/21/2021) 30 capsule 1  . ondansetron (ZOFRAN ODT) 4 MG disintegrating tablet 71m ODT q4 hours prn nausea/vomit 20 tablet 0   No current facility-administered medications for this encounter.    Physical Findings:  vitals were not taken for this visit.   /Unable to assess due to telephone follow-up visit format.  Lab Findings: Lab Results  Component Value Date   WBC 4.0 02/24/2021   HGB 12.5 (L) 02/24/2021   HCT  35.3 (L) 02/24/2021   MCV 89.4 02/24/2021   PLT 191 02/24/2021     Radiographic Findings: No results found.  Impression/Plan: 1. 70y.o. gentleman with Stage T1c adenocarcinoma of the prostate with Gleason score of 3+4, and PSA of 6.49. He will continue to follow up with urology for ongoing PSA determinations but does not currently have a follow-up appointment scheduled with Dr. WJeffie Pollockto his knowledge. He understands what to expect with regards to PSA monitoring going forward. I will look forward to following his response to treatment via correspondence with urology, and would be happy to continue to participate in his care if clinically indicated. I talked to the patient about what to expect in the future, including his risk for erectile dysfunction and rectal bleeding. I encouraged him to call or return to the office if he has any questions regarding his previous radiation or possible radiation side effects. He was comfortable with this plan and will follow up as needed.    ANicholos Johns PA-C

## 2021-02-28 ENCOUNTER — Encounter: Payer: Self-pay | Admitting: Family Medicine

## 2021-02-28 ENCOUNTER — Ambulatory Visit (INDEPENDENT_AMBULATORY_CARE_PROVIDER_SITE_OTHER): Payer: Medicare HMO | Admitting: Family Medicine

## 2021-02-28 VITALS — BP 144/82 | HR 85 | Temp 97.7°F | Wt 148.0 lb

## 2021-02-28 DIAGNOSIS — R197 Diarrhea, unspecified: Secondary | ICD-10-CM | POA: Diagnosis not present

## 2021-02-28 DIAGNOSIS — N179 Acute kidney failure, unspecified: Secondary | ICD-10-CM | POA: Diagnosis not present

## 2021-02-28 NOTE — Patient Instructions (Signed)
7

## 2021-02-28 NOTE — Progress Notes (Signed)
Subjective:    Patient ID: [name], male    DOB: 1951/01/30, 70 y.o.   MRN: [id number]  Chief Complaint  Patient presents with  . Diarrhea    Had diarrhea on Saturday, went to ED on Sunday, was advised to take immodium. Got better last night     HPI Patient was seen today for f/u on acute illness.  Pt seen in ED Sunday for n,v, diarrhea.  Pt notes improvement in symptoms last night.  Took imodium.  Eating regular foods.  Denies recent abx use, blood in stools, changes in meds or foods, fever, chills, n/v.  Past Medical History:  Diagnosis Date  . Diabetes mellitus without complication ([place])   . Insomnia   . Prostate cancer ([place])     No Known Allergies  ROS General: Denies fever, chills, night sweats, changes in weight, changes in appetite HEENT: Denies headaches, ear pain, changes in vision, rhinorrhea, sore throat CV: Denies CP, palpitations, SOB, orthopnea Pulm: Denies SOB, cough, wheezing GI: Denies abdominal pain, nausea, vomiting, constipation +diarrhea GU: Denies dysuria, hematuria, frequency Msk: Denies muscle cramps, joint pains Neuro: Denies weakness, numbness, tingling Skin: Denies rashes, bruising Psych: Denies depression, anxiety, hallucinations     Objective:    Blood pressure (!) 144/82, pulse 85, temperature 97.7 F (36.5 C), temperature source Oral, weight 148 lb (67.1 kg), SpO2 95 %.  Gen. Pleasant, well-nourished, in no distress, normal affect   HEENT: [place]/AT, face symmetric, conjunctiva clear, no scleral icterus, PERRLA, EOMI, nares patent without drainage Lungs: no accessory muscle use, CTAB, no wheezes or rales Cardiovascular: RRR, no m/r/g, no peripheral edema Abdomen: BS present, soft, NT/ND, no hepatosplenomegaly. Musculoskeletal: No deformities, no cyanosis or clubbing, normal tone Neuro:  A&Ox3, CN II-XII intact, normal gait Skin:  Warm, no lesions/ rash.  Calluses on fingers.   Wt Readings from Last 3 Encounters:  02/28/21 148 lb  (67.1 kg)  02/24/21 157 lb 13.6 oz (71.6 kg)  12/24/20 162 lb (73.5 kg)    Lab Results  Component Value Date   WBC 4.0 02/24/2021   HGB 12.5 (L) 02/24/2021   HCT 35.3 (L) 02/24/2021   PLT 191 02/24/2021   GLUCOSE 171 (H) 02/24/2021   CHOL 137 09/07/2020   TRIG 56 09/07/2020   HDL 53 09/07/2020   LDLCALC 70 09/07/2020   ALT 28 02/24/2021   AST 34 02/24/2021   NA 140 02/24/2021   K 4.0 02/24/2021   CL 109 02/24/2021   CREATININE 2.11 (H) 02/24/2021   BUN 44 (H) 02/24/2021   CO2 20 (L) 02/24/2021   TSH 1.01 09/07/2020   PSA 6.49 (H) 03/21/2020   HGBA1C 6.6 (H) 09/07/2020   MICROALBUR 13.6 (H) 11/10/2018    Assessment/Plan:  Diarrhea, unspecified type  -resolving -discussed likely viral etiology -bland diet, advance as tolerated -OTC imodium prn - Plan: Basic metabolic panel  AKI (acute kidney injury) ([place])  -per chart review creat 2.11 on 02/24/21 in ED -will recheck BMP -renally dose meds, avoid nephrotoxic meds -for continued decrease in renal function refer to Nephrology - Plan: Basic metabolic panel  F/u prn  Grier Mitts, MD

## 2021-03-13 ENCOUNTER — Encounter: Payer: Self-pay | Admitting: Family Medicine

## 2021-03-14 ENCOUNTER — Other Ambulatory Visit: Payer: Self-pay | Admitting: Family Medicine

## 2021-03-14 DIAGNOSIS — I10 Essential (primary) hypertension: Secondary | ICD-10-CM

## 2021-03-20 ENCOUNTER — Ambulatory Visit (INDEPENDENT_AMBULATORY_CARE_PROVIDER_SITE_OTHER): Payer: Medicare HMO | Admitting: Family Medicine

## 2021-03-20 ENCOUNTER — Encounter: Payer: Self-pay | Admitting: Family Medicine

## 2021-03-20 ENCOUNTER — Other Ambulatory Visit: Payer: Self-pay

## 2021-03-20 VITALS — BP 180/80 | HR 81 | Temp 98.5°F | Ht 66.0 in | Wt 154.4 lb

## 2021-03-20 DIAGNOSIS — L97519 Non-pressure chronic ulcer of other part of right foot with unspecified severity: Secondary | ICD-10-CM | POA: Diagnosis not present

## 2021-03-20 DIAGNOSIS — E11621 Type 2 diabetes mellitus with foot ulcer: Secondary | ICD-10-CM

## 2021-03-20 DIAGNOSIS — N179 Acute kidney failure, unspecified: Secondary | ICD-10-CM

## 2021-03-20 DIAGNOSIS — I10 Essential (primary) hypertension: Secondary | ICD-10-CM | POA: Diagnosis not present

## 2021-03-20 LAB — CBC WITH DIFFERENTIAL/PLATELET
Basophils Absolute: 0 10*3/uL (ref 0.0–0.1)
Basophils Relative: 0.6 % (ref 0.0–3.0)
Eosinophils Absolute: 0.1 10*3/uL (ref 0.0–0.7)
Eosinophils Relative: 2.5 % (ref 0.0–5.0)
HCT: 28.4 % — ABNORMAL LOW (ref 39.0–52.0)
Hemoglobin: 10.2 g/dL — ABNORMAL LOW (ref 13.0–17.0)
Lymphocytes Relative: 18.6 % (ref 12.0–46.0)
Lymphs Abs: 0.7 10*3/uL (ref 0.7–4.0)
MCHC: 36 g/dL (ref 30.0–36.0)
MCV: 88.3 fl (ref 78.0–100.0)
Monocytes Absolute: 0.5 10*3/uL (ref 0.1–1.0)
Monocytes Relative: 12.5 % — ABNORMAL HIGH (ref 3.0–12.0)
Neutro Abs: 2.4 10*3/uL (ref 1.4–7.7)
Neutrophils Relative %: 65.8 % (ref 43.0–77.0)
Platelets: 228 10*3/uL (ref 150.0–400.0)
RBC: 3.21 Mil/uL — ABNORMAL LOW (ref 4.22–5.81)
RDW: 13.2 % (ref 11.5–15.5)
WBC: 3.6 10*3/uL — ABNORMAL LOW (ref 4.0–10.5)

## 2021-03-20 LAB — COMPREHENSIVE METABOLIC PANEL
ALT: 15 U/L (ref 0–53)
AST: 17 U/L (ref 0–37)
Albumin: 3.6 g/dL (ref 3.5–5.2)
Alkaline Phosphatase: 73 U/L (ref 39–117)
BUN: 27 mg/dL — ABNORMAL HIGH (ref 6–23)
CO2: 31 mEq/L (ref 19–32)
Calcium: 9.1 mg/dL (ref 8.4–10.5)
Chloride: 105 mEq/L (ref 96–112)
Creatinine, Ser: 1.34 mg/dL (ref 0.40–1.50)
GFR: 54.33 mL/min — ABNORMAL LOW (ref >60.00)
Glucose, Bld: 86 mg/dL (ref 70–99)
Potassium: 4.1 mEq/L (ref 3.5–5.1)
Sodium: 142 mEq/L (ref 135–145)
Total Bilirubin: 0.5 mg/dL (ref 0.2–1.2)
Total Protein: 6.3 g/dL (ref 6.0–8.3)

## 2021-03-20 LAB — SEDIMENTATION RATE: Sed Rate: 54 mm/hr — ABNORMAL HIGH (ref 0–20)

## 2021-03-20 LAB — HEMOGLOBIN A1C: Hgb A1c MFr Bld: 7.3 % — ABNORMAL HIGH (ref 4.6–6.5)

## 2021-03-20 MED ORDER — AMOXICILLIN-POT CLAVULANATE 500-125 MG PO TABS: 1.0000 | ORAL_TABLET | Freq: Three times a day (TID) | ORAL | 0 refills | Status: AC

## 2021-03-20 NOTE — Progress Notes (Signed)
Subjective:    Patient ID: [name], male    DOB: Sep 12, 1951, 70 y.o.   MRN: [id number]  Chief Complaint  Patient presents with  . Foot Injury    Patient complains of right foot pain, x2 weeks, Tried ointment from wound care    HPI Patient was seen today for concerns of stated above.  Patient endorses right great toe and second toe pain/blister x2 weeks.  Patient used an ointment he had foot wound.  Patient states the blister started when he wore new boots.  Patient endorses they had enough room in the toebox.  Currently wearing postop shoe with toes wrapped with gauze.  Denies fever, chills, nausea, vomiting, erythema of foot.  Patient endorses taking BP meds this morning.  Ate breakfast but has yet to have lunch.  Past Medical History:  Diagnosis Date  . Diabetes mellitus without complication ([place])   . Insomnia   . Prostate cancer ([place])     No Known Allergies  ROS General: Denies fever, chills, night sweats, changes in weight, changes in appetite HEENT: Denies headaches, ear pain, changes in vision, rhinorrhea, sore throat CV: Denies CP, palpitations, SOB, orthopnea Pulm: Denies SOB, cough, wheezing GI: Denies abdominal pain, nausea, vomiting, diarrhea, constipation GU: Denies dysuria, hematuria, frequency. Msk: Denies muscle cramps, joint pains Neuro: Denies weakness, numbness, tingling Skin: Denies rashes, bruising  + blister/wound on right great toe and second toe Psych: Denies depression, anxiety, hallucinations     Objective:    Blood pressure (!) 180/80, pulse 81, temperature 98.5 F (36.9 C), temperature source Oral, height 5\' 6"  (1.676 m), weight 154 lb 6.4 oz (70 kg), SpO2 96 %.  Gen. Pleasant, well-nourished, in no distress, normal affect   HEENT: /AT, face symmetric, conjunctiva clear, no scleral icterus, PERRLA, EOMI, nares patent without drainage Lungs: no accessory muscle use Cardiovascular: RRR, no m/r/g, no peripheral edema.  DP and PT pulses  intact. Musculoskeletal: No deformities, no cyanosis or clubbing, normal tone Neuro:  A&Ox3, CN II-XII intact, normal gait Skin:  Warm, dry.  Patient wearing postop walking shoe on right foot.  Right great toe with malodorous 2 cm ulcer with area of eschar/dead skin and granulation tissue pink tissue surrounding edges of ulcer.  No streaking or discoloration of right great toe.  Right second with healing blister.  Right great toenail jagged at medial corner.  Right great toe cleaned and redressed with sterile Tefla and gauze      Wt Readings from Last 3 Encounters:  03/20/21 154 lb 6.4 oz (70 kg)  02/28/21 148 lb (67.1 kg)  02/24/21 157 lb 13.6 oz (71.6 kg)    Lab Results  Component Value Date   WBC 4.0 02/24/2021   HGB 12.5 (L) 02/24/2021   HCT 35.3 (L) 02/24/2021   PLT 191 02/24/2021   GLUCOSE 171 (H) 02/24/2021   CHOL 137 09/07/2020   TRIG 56 09/07/2020   HDL 53 09/07/2020   LDLCALC 70 09/07/2020   ALT 28 02/24/2021   AST 34 02/24/2021   NA 140 02/24/2021   K 4.0 02/24/2021   CL 109 02/24/2021   CREATININE 2.11 (H) 02/24/2021   BUN 44 (H) 02/24/2021   CO2 20 (L) 02/24/2021   TSH 1.01 09/07/2020   PSA 6.49 (H) 03/21/2020   HGBA1C 6.6 (H) 09/07/2020   MICROALBUR 13.6 (H) 11/10/2018    Assessment/Plan:  Diabetic ulcer of toe of right foot associated with type 2 diabetes mellitus, unspecified ulcer stage ([place]) -Consent obtained.  Attempt  to obtain wound culture of right great toe unsuccessful 2/2 presence of granulation tissue.  No purulent drainage expressed.  Right great toe cleaned and redressed.  Patient tolerated procedure well. -Hemoglobin A1c 6.6% on 09/07/2020 -Patient with history of foot wound 2/2 wearing tight shoes.  Advised on importance of wearing diabetic shoes -Concern for osteo given history of diabetes -Patient also seen by [facility].  Office contacted for urgent appointment.  Patient to be seen tomorrow 5/26 at 4:15 pm at [facility]  location. -Hesitant to start in the event bone biopsy needed but will obtain labs and start Augmentin. -Attempted to obtain wound culture without success. - Plan: CBC with Differential/Platelet, Sedimentation Rate, CMP, Hemoglobin A1c, amoxicillin-clavulanate (AUGMENTIN) 500-125 MG tablet  Essential hypertension -Uncontrolled -Recheck -Discussed the importance of lifestyle modifications -Continue current medications including losartan 100 mg and HCTZ 12.5 mg. -We will repeat renal function.  If creatinine remains elevated will discontinue HCTZ and start ACEI/ARB or beta-blocker. -We will plan on additional medication given elevation regardless of labs. - Plan: CMP  AKI (acute kidney injury) ([place]) -Creatinine on 02/24/2021 -Renally dose medications -Avoid nephrotoxic medications -Patient encouraged to increase p.o. intake of water -For continued elevation in creatinine discontinue HCTZ and place referral to nephrology  F/u as needed in 2-4 weeks, sooner if needed  Grier Mitts, MD

## 2021-03-21 ENCOUNTER — Other Ambulatory Visit: Payer: Self-pay | Admitting: Sports Medicine

## 2021-03-21 ENCOUNTER — Other Ambulatory Visit: Payer: Self-pay | Admitting: Family Medicine

## 2021-03-21 ENCOUNTER — Ambulatory Visit (INDEPENDENT_AMBULATORY_CARE_PROVIDER_SITE_OTHER): Payer: Medicare HMO | Admitting: Sports Medicine

## 2021-03-21 ENCOUNTER — Encounter: Payer: Self-pay | Admitting: Sports Medicine

## 2021-03-21 ENCOUNTER — Ambulatory Visit (INDEPENDENT_AMBULATORY_CARE_PROVIDER_SITE_OTHER): Payer: Medicare HMO

## 2021-03-21 VITALS — Temp 99.2°F

## 2021-03-21 DIAGNOSIS — L97512 Non-pressure chronic ulcer of other part of right foot with fat layer exposed: Secondary | ICD-10-CM

## 2021-03-21 DIAGNOSIS — M79671 Pain in right foot: Secondary | ICD-10-CM

## 2021-03-21 DIAGNOSIS — E1165 Type 2 diabetes mellitus with hyperglycemia: Secondary | ICD-10-CM | POA: Diagnosis not present

## 2021-03-21 DIAGNOSIS — M86171 Other acute osteomyelitis, right ankle and foot: Secondary | ICD-10-CM | POA: Diagnosis not present

## 2021-03-21 DIAGNOSIS — D649 Anemia, unspecified: Secondary | ICD-10-CM

## 2021-03-21 DIAGNOSIS — E11621 Type 2 diabetes mellitus with foot ulcer: Secondary | ICD-10-CM

## 2021-03-21 MED ORDER — FERROUS GLUCONATE 324 (38 FE) MG PO TABS: 324.0000 mg | ORAL_TABLET | Freq: Every day | ORAL | 3 refills | Status: DC

## 2021-03-21 NOTE — Progress Notes (Signed)
dg 

## 2021-03-21 NOTE — Progress Notes (Signed)
Subjective: [name] is a 70 y.o. male patient seen in office for evaluation of ulceration of the right great toe. Patient has a history of diabetes and a blood glucose level  today not recorded but last A1c was 7.3.  Patient is changing the dressing using sliver patch to the toe but the still drains and has a smell. Went to PCP and had bloodwork done and was started on Augmentin. Reports that the wound 1st started from a boot rubbing the toe. Denies nausea/fever/vomiting/chills/night sweats/shortness of breath/pain. Patient has no other pedal complaints at this time.  Patient Active Problem List   Diagnosis Date Noted  . Delayed sleep phase syndrome 11/13/2020  . Prostate cancer ([place]) 09/16/2020  . Severe nonproliferative diabetic retinopathy of right eye with macular edema associated with type 2 diabetes mellitus ([place]) 08/28/2020  . Age-related nuclear cataract, bilateral 08/28/2020  . Hypertensive retinopathy of both eyes 08/28/2020  . Bilateral carotid artery stenosis 08/28/2020  . Murmur 08/28/2020  . Diabetic macular edema ([place]) 07/13/2020  . Mild nonproliferative diabetic retinopathy of right eye with macular edema associated with type 2 diabetes mellitus ([place]) 07/13/2020  . BPH associated with nocturia 02/21/2020  . Type 2 diabetes mellitus with diabetic polyneuropathy, without long-term current use of insulin ([place]) 07/06/2019  . Dyslipidemia 07/06/2019  . ED (erectile dysfunction) 12/24/2018  . Osteoarthritis 11/25/2018  . Uncontrolled type 2 diabetes mellitus with hyperglycemia ([place]) 11/12/2018  . Hyperlipidemia associated with type 2 diabetes mellitus ([place]) 11/12/2018  . Essential hypertension 11/10/2018  . Insomnia 11/10/2018  . Diabetes ([place]) 01/23/2016  . Depression ([place]) 01/22/2016   Current Outpatient Medications on File Prior to Visit  Medication Sig Dispense Refill  . amoxicillin-clavulanate (AUGMENTIN) 500-125 MG tablet Take 1 tablet (500 mg total) by mouth  3 (three) times daily for 7 days. 21 tablet 0  . atorvastatin (LIPITOR) 40 MG tablet TAKE 1 TABLET BY MOUTH EVERY DAY 90 tablet 1  . blood glucose meter kit and supplies Dispense based on patient and insurance preference. Use to check glucose daily (FOR ICD-10 E10.9, E11.9). 1 each 0  . Continuous Blood Gluc Receiver (DEXCOM G6 RECEIVER) DEVI 1 Device by Does not apply route as directed. 1 each 0  . Continuous Blood Gluc Sensor (DEXCOM G6 SENSOR) MISC 1 Device by Does not apply route as directed. 9 each 3  . Continuous Blood Gluc Transmit (DEXCOM G6 TRANSMITTER) MISC 1 Device by Does not apply route as directed. 1 each 3  . hydrochlorothiazide (HYDRODIURIL) 12.5 MG tablet Take 1 tablet (12.5 mg total) by mouth daily. 30 tablet 3  . insulin isophane & regular human (NOVOLIN 70/30 FLEXPEN) (70-30) 100 UNIT/ML KwikPen Inject 20 Units into the skin daily before breakfast AND 22 Units at bedtime. 15 mL 11  . Insulin Pen Needle 31G X 8 MM MISC Use as directed for checking blood sugar 3 times daily, more frequently for hyper or hypoglycemia. 100 each 3  . Lancet Devices (ONE TOUCH DELICA LANCING DEV) MISC 1 Device by Does not apply route as directed. 1 each 0  . latanoprost (XALATAN) 0.005 % ophthalmic solution SMARTSIG:In Eye(s)    . losartan (COZAAR) 100 MG tablet TAKE 1 TABLET BY MOUTH EVERY DAY 90 tablet 1  . MATZIM LA 300 MG 24 hr tablet TAKE 1 TABLET BY MOUTH EVERY DAY 90 tablet 1  . omeprazole (PRILOSEC) 40 MG capsule TAKE 1 CAPSULE BY MOUTH EVERY DAY 30 capsule 1  . ondansetron (ZOFRAN ODT) 4 MG disintegrating tablet  44m ODT q4 hours prn nausea/vomit 20 tablet 0  . OneTouch Delica Lancets 383MMISC 1 Device by Does not apply route as directed. Use to test up to 4x daily. 400 each 1  . ONETOUCH ULTRA test strip USE UP TO 4X DAILY 300 strip 2   No current facility-administered medications on file prior to visit.   No Known Allergies  Recent Results (from the past 2160 hour(s))  CBC with  Differential     Status: Abnormal   Collection Time: 02/24/21  8:17 PM  Result Value Ref Range   WBC 4.0 4.0 - 10.5 K/uL   RBC 3.95 (L) 4.22 - 5.81 MIL/uL   Hemoglobin 12.5 (L) 13.0 - 17.0 g/dL   HCT 35.3 (L) 39.0 - 52.0 %   MCV 89.4 80.0 - 100.0 fL   MCH 31.6 26.0 - 34.0 pg   MCHC 35.4 30.0 - 36.0 g/dL   RDW 12.2 11.5 - 15.5 %   Platelets 191 150 - 400 K/uL   nRBC 0.0 0.0 - 0.2 %   Neutrophils Relative % 85 %   Neutro Abs 3.4 1.7 - 7.7 K/uL   Lymphocytes Relative 4 %   Lymphs Abs 0.2 (L) 0.7 - 4.0 K/uL   Monocytes Relative 9 %   Monocytes Absolute 0.4 0.1 - 1.0 K/uL   Eosinophils Relative 1 %   Eosinophils Absolute 0.0 0.0 - 0.5 K/uL   Basophils Relative 0 %   Basophils Absolute 0.0 0.0 - 0.1 K/uL   Immature Granulocytes 1 %   Abs Immature Granulocytes 0.02 0.00 - 0.07 K/uL    Comment: Performed at [facility] [id number] Comprehensive metabolic panel     Status: Abnormal   Collection Time: 02/24/21  8:17 PM  Result Value Ref Range   Sodium 140 135 - 145 mmol/L   Potassium 4.0 3.5 - 5.1 mmol/L   Chloride 109 98 - 111 mmol/L   CO2 20 (L) 22 - 32 mmol/L   Glucose, Bld 171 (H) 70 - 99 mg/dL    Comment: Glucose reference range applies only to samples taken after fasting for at least 8 hours.   BUN 44 (H) 8 - 23 mg/dL   Creatinine, Ser 2.11 (H) 0.61 - 1.24 mg/dL   Calcium 9.1 8.9 - 10.3 mg/dL   Total Protein 7.1 6.5 - 8.1 g/dL   Albumin 4.0 3.5 - 5.0 g/dL   AST 34 15 - 41 U/L   ALT 28 0 - 44 U/L   Alkaline Phosphatase 72 38 - 126 U/L   Total Bilirubin 0.4 0.3 - 1.2 mg/dL   GFR, Estimated 33 (L) >60 mL/min    Comment: (NOTE) Calculated using the CKD-EPI Creatinine Equation (2021)    Anion gap 11 5 - 15    Comment: Performed at [facility] [address] [id number] Lipase, blood     Status: Abnormal   Collection Time: 02/24/21  8:17 PM  Result Value Ref Range   Lipase <10 (L)  11 - 51 U/L    Comment: Performed at [facility] [address] [place] [id number] CBC with Differential/Platelet     Status: Abnormal   Collection Time: 03/20/21  3:05 PM  Result Value Ref Range   WBC 3.6 (L) 4.0 - 10.5 K/uL   RBC 3.21 (L) 4.22 - 5.81 Mil/uL   Hemoglobin 10.2 (L) 13.0 - 17.0 g/dL   HCT 28.4 (L)  39.0 - 52.0 %   MCV 88.3 78.0 - 100.0 fl   MCHC 36.0 30.0 - 36.0 g/dL   RDW 13.2 11.5 - 15.5 %   Platelets 228.0 150.0 - 400.0 K/uL   Neutrophils Relative % 65.8 43.0 - 77.0 %   Lymphocytes Relative 18.6 12.0 - 46.0 %   Monocytes Relative 12.5 (H) 3.0 - 12.0 %   Eosinophils Relative 2.5 0.0 - 5.0 %   Basophils Relative 0.6 0.0 - 3.0 %   Neutro Abs 2.4 1.4 - 7.7 K/uL   Lymphs Abs 0.7 0.7 - 4.0 K/uL   Monocytes Absolute 0.5 0.1 - 1.0 K/uL   Eosinophils Absolute 0.1 0.0 - 0.7 K/uL   Basophils Absolute 0.0 0.0 - 0.1 K/uL  Sedimentation Rate     Status: Abnormal   Collection Time: 03/20/21  3:05 PM  Result Value Ref Range   Sed Rate 54 (H) 0 - 20 mm/hr  CMP     Status: Abnormal   Collection Time: 03/20/21  3:05 PM  Result Value Ref Range   Sodium 142 135 - 145 mEq/L   Potassium 4.1 3.5 - 5.1 mEq/L   Chloride 105 96 - 112 mEq/L   CO2 31 19 - 32 mEq/L   Glucose, Bld 86 70 - 99 mg/dL   BUN 27 (H) 6 - 23 mg/dL   Creatinine, Ser 1.34 0.40 - 1.50 mg/dL   Total Bilirubin 0.5 0.2 - 1.2 mg/dL   Alkaline Phosphatase 73 39 - 117 U/L   AST 17 0 - 37 U/L   ALT 15 0 - 53 U/L   Total Protein 6.3 6.0 - 8.3 g/dL   Albumin 3.6 3.5 - 5.2 g/dL   GFR 54.33 (L) >60.00 mL/min    Comment: Calculated using the CKD-EPI Creatinine Equation (2021)   Calcium 9.1 8.4 - 10.5 mg/dL  Hemoglobin A1c     Status: Abnormal   Collection Time: 03/20/21  3:05 PM  Result Value Ref Range   Hgb A1c MFr Bld 7.3 (H) 4.6 - 6.5 %    Comment: Glycemic Control Guidelines for People with Diabetes:Non Diabetic:  <6%Goal of Therapy: <7%Additional Action Suggested:  >8%      Objective: There were no vitals filed for this visit.  General: Patient is awake, alert, oriented x 3 and in no acute distress.  Dermatology: Skin is warm and dry bilateral with a full thickness ulceration present  Right great toe distal tuft with fibronecotric base. Ulceration measures 2 cm x 1.5 cm x 0.3 cm. There is a macerated border with a fibrotic base with a necrotic center of loose skin that is lifting off. The ulceration probes to fatty tissue. There is some malodor, clear to yellow active drainage, focal erythema, focal edema. No acute signs of infection.   Vascular: Dorsalis Pedis pulse = 2/4 Bilateral,  Posterior Tibial pulse = 1/4 Bilateral,  Capillary Fill Time < 5 seconds  Neurologic: Protective sensation diminished bilateral however patient is hypersensitive to light touch.  Musculosketal: There is no pain with palpation to ulcerated area. No pain with compression to calves bilateral. No gross bony deformities noted bilateral.  Xrays, Right foot:Ulcer defect to the toe, hazy changes to bone at distal tuft, no obvious bony destruction, no clear signs of osteomyelitis. No gas in soft tissues.   No results for input(s): GRAMSTAIN, LABORGA in the last 8760 hours.  Assessment and Plan:  Problem List Items Addressed This Visit      Endocrine   Uncontrolled type  2 diabetes mellitus with hyperglycemia ([place])    Other Visit Diagnoses    Right foot pain    -  Primary   Acute osteomyelitis of toe of right foot ([place])       Relevant Orders   WOUND CULTURE   Diabetic ulcer of toe of right foot associated with type 2 diabetes mellitus, with fat layer exposed ([place])           -Examined patient and discussed the progression of the wound and treatment alternatives. -Xrays reviewed - Excisionally dedbrided ulceration at right 1st toe to healthy bleeding borders removing nonviable tissue using a sterile chisel blade. Wound measures post debridement as above. Wound was debrided to  the level of the dermis with viable wound base exposed to promote healing. Hemostasis was achieved with manuel pressure. Patient tolerated procedure well without any discomfort or anesthesia necessary for this wound debridement.  -Wound culture obtained; cont Augmentin until results are available -Applied iodosorb and dry sterile dressing and instructed patient to continue with daily dressings at home consisting of the same daily  -Continue with post op shoe - Advised patient to go to the ER or return to office if the wound worsens or if constitutional symptoms are present. -If toe is no better on next week we will order a MRI -Patient to return to office in 1 week for follow up care and evaluation or sooner if problems arise.  Landis Martins, DPM

## 2021-03-22 NOTE — Progress Notes (Signed)
Results viewed on MyChart. 

## 2021-03-24 ENCOUNTER — Other Ambulatory Visit: Payer: Self-pay | Admitting: Sports Medicine

## 2021-03-24 LAB — WOUND CULTURE
MICRO NUMBER:: 11938774
SPECIMEN QUALITY:: ADEQUATE

## 2021-03-24 MED ORDER — CIPROFLOXACIN HCL 500 MG PO TABS: 500.0000 mg | ORAL_TABLET | Freq: Two times a day (BID) | ORAL | 0 refills | Status: AC

## 2021-03-24 NOTE — Progress Notes (Signed)
Sent Cipro for him to take in addition to Augmentin for + wound culture  -Dr. Chauncey Cruel

## 2021-03-27 ENCOUNTER — Telehealth: Payer: Self-pay

## 2021-03-27 NOTE — Telephone Encounter (Signed)
Pt called to discuss if he had picked up his medicine. Pt states he has picked it up and is taking as directed, no new concerns.

## 2021-03-28 ENCOUNTER — Other Ambulatory Visit: Payer: Self-pay

## 2021-03-28 ENCOUNTER — Ambulatory Visit (INDEPENDENT_AMBULATORY_CARE_PROVIDER_SITE_OTHER): Payer: Medicare HMO | Admitting: Sports Medicine

## 2021-03-28 ENCOUNTER — Encounter: Payer: Self-pay | Admitting: Sports Medicine

## 2021-03-28 DIAGNOSIS — M86171 Other acute osteomyelitis, right ankle and foot: Secondary | ICD-10-CM | POA: Diagnosis not present

## 2021-03-28 DIAGNOSIS — E1165 Type 2 diabetes mellitus with hyperglycemia: Secondary | ICD-10-CM

## 2021-03-28 DIAGNOSIS — M79671 Pain in right foot: Secondary | ICD-10-CM

## 2021-03-28 DIAGNOSIS — E11621 Type 2 diabetes mellitus with foot ulcer: Secondary | ICD-10-CM

## 2021-03-28 DIAGNOSIS — L97512 Non-pressure chronic ulcer of other part of right foot with fat layer exposed: Secondary | ICD-10-CM

## 2021-03-28 NOTE — Progress Notes (Signed)
Subjective: [name] is a 70 y.o. male patient seen in office for follow-up evaluation of ulceration of the right great toe. Patient has a history of diabetes and a blood glucose level  today not recorded but last A1c was 7.3 as noted from last week.  Patient is changing the dressing using Iodosorb to the toe reports that it is doing okay a little bit of hard skin denies any odor.  Reports that he got his antibiotic of Cipro and is currently taking all his antibiotics without any issues.  Denies nausea/fever/vomiting/chills/night sweats/shortness of breath/pain. Patient has no other pedal complaints at this time.  Patient Active Problem List   Diagnosis Date Noted  . Delayed sleep phase syndrome 11/13/2020  . Prostate cancer ([place]) 09/16/2020  . Severe nonproliferative diabetic retinopathy of right eye with macular edema associated with type 2 diabetes mellitus ([place]) 08/28/2020  . Age-related nuclear cataract, bilateral 08/28/2020  . Hypertensive retinopathy of both eyes 08/28/2020  . Bilateral carotid artery stenosis 08/28/2020  . Murmur 08/28/2020  . Diabetic macular edema ([place]) 07/13/2020  . Mild nonproliferative diabetic retinopathy of right eye with macular edema associated with type 2 diabetes mellitus ([place]) 07/13/2020  . BPH associated with nocturia 02/21/2020  . Type 2 diabetes mellitus with diabetic polyneuropathy, without long-term current use of insulin ([place]) 07/06/2019  . Dyslipidemia 07/06/2019  . ED (erectile dysfunction) 12/24/2018  . Osteoarthritis 11/25/2018  . Uncontrolled type 2 diabetes mellitus with hyperglycemia ([place]) 11/12/2018  . Hyperlipidemia associated with type 2 diabetes mellitus ([place]) 11/12/2018  . Essential hypertension 11/10/2018  . Insomnia 11/10/2018  . Diabetes ([place]) 01/23/2016  . Depression (em[place]) 01/22/2016   Current Outpatient Medications on File Prior to Visit  Medication Sig Dispense Refill  . atorvastatin (LIPITOR) 40 MG tablet TAKE 1  TABLET BY MOUTH EVERY DAY 90 tablet 1  . blood glucose meter kit and supplies Dispense based on patient and insurance preference. Use to check glucose daily (FOR ICD-10 E10.9, E11.9). 1 each 0  . ciprofloxacin (CIPRO) 500 MG tablet Take 1 tablet (500 mg total) by mouth 2 (two) times daily for 10 days. 20 tablet 0  . Continuous Blood Gluc Receiver (DEXCOM G6 RECEIVER) DEVI 1 Device by Does not apply route as directed. 1 each 0  . Continuous Blood Gluc Sensor (DEXCOM G6 SENSOR) MISC 1 Device by Does not apply route as directed. 9 each 3  . Continuous Blood Gluc Transmit (DEXCOM G6 TRANSMITTER) MISC 1 Device by Does not apply route as directed. 1 each 3  . ferrous gluconate (FERGON) 324 MG tablet Take 1 tablet (324 mg total) by mouth daily with breakfast. 30 tablet 3  . hydrochlorothiazide (HYDRODIURIL) 12.5 MG tablet Take 1 tablet (12.5 mg total) by mouth daily. 30 tablet 3  . insulin isophane & regular human (NOVOLIN 70/30 FLEXPEN) (70-30) 100 UNIT/ML KwikPen Inject 20 Units into the skin daily before breakfast AND 22 Units at bedtime. 15 mL 11  . Insulin Pen Needle 31G X 8 MM MISC Use as directed for checking blood sugar 3 times daily, more frequently for hyper or hypoglycemia. 100 each 3  . Lancet Devices (ONE TOUCH DELICA LANCING DEV) MISC 1 Device by Does not apply route as directed. 1 each 0  . latanoprost (XALATAN) 0.005 % ophthalmic solution SMARTSIG:In Eye(s)    . losartan (COZAAR) 100 MG tablet TAKE 1 TABLET BY MOUTH EVERY DAY 90 tablet 1  . MATZIM LA 300 MG 24 hr tablet TAKE 1 TABLET BY MOUTH EVERY  DAY 90 tablet 1  . omeprazole (PRILOSEC) 40 MG capsule TAKE 1 CAPSULE BY MOUTH EVERY DAY 30 capsule 1  . ondansetron (ZOFRAN ODT) 4 MG disintegrating tablet 5m ODT q4 hours prn nausea/vomit 20 tablet 0  . OneTouch Delica Lancets 396EMISC 1 Device by Does not apply route as directed. Use to test up to 4x daily. 400 each 1  . ONETOUCH ULTRA test strip USE UP TO 4X DAILY 300 strip 2   No current  facility-administered medications on file prior to visit.   No Known Allergies  Recent Results (from the past 2160 hour(s))  CBC with Differential     Status: Abnormal   Collection Time: 02/24/21  8:17 PM  Result Value Ref Range   WBC 4.0 4.0 - 10.5 K/uL   RBC 3.95 (L) 4.22 - 5.81 MIL/uL   Hemoglobin 12.5 (L) 13.0 - 17.0 g/dL   HCT 35.3 (L) 39.0 - 52.0 %   MCV 89.4 80.0 - 100.0 fL   MCH 31.6 26.0 - 34.0 pg   MCHC 35.4 30.0 - 36.0 g/dL   RDW 12.2 11.5 - 15.5 %   Platelets 191 150 - 400 K/uL   nRBC 0.0 0.0 - 0.2 %   Neutrophils Relative % 85 %   Neutro Abs 3.4 1.7 - 7.7 K/uL   Lymphocytes Relative 4 %   Lymphs Abs 0.2 (L) 0.7 - 4.0 K/uL   Monocytes Relative 9 %   Monocytes Absolute 0.4 0.1 - 1.0 K/uL   Eosinophils Relative 1 %   Eosinophils Absolute 0.0 0.0 - 0.5 K/uL   Basophils Relative 0 %   Basophils Absolute 0.0 0.0 - 0.1 K/uL   Immature Granulocytes 1 %   Abs Immature Granulocytes 0.02 0.00 - 0.07 K/uL    Comment: Performed at [facility] [address] [id number] Comprehensive metabolic panel     Status: Abnormal   Collection Time: 02/24/21  8:17 PM  Result Value Ref Range   Sodium 140 135 - 145 mmol/L   Potassium 4.0 3.5 - 5.1 mmol/L   Chloride 109 98 - 111 mmol/L   CO2 20 (L) 22 - 32 mmol/L   Glucose, Bld 171 (H) 70 - 99 mg/dL    Comment: Glucose reference range applies only to samples taken after fasting for at least 8 hours.   BUN 44 (H) 8 - 23 mg/dL   Creatinine, Ser 2.11 (H) 0.61 - 1.24 mg/dL   Calcium 9.1 8.9 - 10.3 mg/dL   Total Protein 7.1 6.5 - 8.1 g/dL   Albumin 4.0 3.5 - 5.0 g/dL   AST 34 15 - 41 U/L   ALT 28 0 - 44 U/L   Alkaline Phosphatase 72 38 - 126 U/L   Total Bilirubin 0.4 0.3 - 1.2 mg/dL   GFR, Estimated 33 (L) >60 mL/min    Comment: (NOTE) Calculated using the CKD-EPI Creatinine Equation (2021)    Anion gap 11 5 - 15    Comment: Performed at [facility] [address] [place] [id number] Lipase, blood     Status: Abnormal   Collection Time: 02/24/21  8:17 PM  Result Value Ref Range   Lipase <10 (L) 11 - 51 U/L    Comment: Performed at [facility] [address] [place] [id number] CBC with Differential/Platelet     Status: Abnormal   Collection Time: 03/20/21  3:05 PM  Result Value Ref Range  WBC 3.6 (L) 4.0 - 10.5 K/uL   RBC 3.21 (L) 4.22 - 5.81 Mil/uL   Hemoglobin 10.2 (L) 13.0 - 17.0 g/dL   HCT 28.4 (L) 39.0 - 52.0 %   MCV 88.3 78.0 - 100.0 fl   MCHC 36.0 30.0 - 36.0 g/dL   RDW 13.2 11.5 - 15.5 %   Platelets 228.0 150.0 - 400.0 K/uL   Neutrophils Relative % 65.8 43.0 - 77.0 %   Lymphocytes Relative 18.6 12.0 - 46.0 %   Monocytes Relative 12.5 (H) 3.0 - 12.0 %   Eosinophils Relative 2.5 0.0 - 5.0 %   Basophils Relative 0.6 0.0 - 3.0 %   Neutro Abs 2.4 1.4 - 7.7 K/uL   Lymphs Abs 0.7 0.7 - 4.0 K/uL   Monocytes Absolute 0.5 0.1 - 1.0 K/uL   Eosinophils Absolute 0.1 0.0 - 0.7 K/uL   Basophils Absolute 0.0 0.0 - 0.1 K/uL  Sedimentation Rate     Status: Abnormal   Collection Time: 03/20/21  3:05 PM  Result Value Ref Range   Sed Rate 54 (H) 0 - 20 mm/hr  CMP     Status: Abnormal   Collection Time: 03/20/21  3:05 PM  Result Value Ref Range   Sodium 142 135 - 145 mEq/L   Potassium 4.1 3.5 - 5.1 mEq/L   Chloride 105 96 - 112 mEq/L   CO2 31 19 - 32 mEq/L   Glucose, Bld 86 70 - 99 mg/dL   BUN 27 (H) 6 - 23 mg/dL   Creatinine, Ser 1.34 0.40 - 1.50 mg/dL   Total Bilirubin 0.5 0.2 - 1.2 mg/dL   Alkaline Phosphatase 73 39 - 117 U/L   AST 17 0 - 37 U/L   ALT 15 0 - 53 U/L   Total Protein 6.3 6.0 - 8.3 g/dL   Albumin 3.6 3.5 - 5.2 g/dL   GFR 54.33 (L) >60.00 mL/min    Comment: Calculated using the CKD-EPI Creatinine Equation (2021)   Calcium 9.1 8.4 - 10.5 mg/dL  Hemoglobin A1c     Status: Abnormal   Collection Time: 03/20/21  3:05 PM  Result Value Ref Range   Hgb A1c MFr Bld 7.3 (H) 4.6 - 6.5 %    Comment:  Glycemic Control Guidelines for People with Diabetes:Non Diabetic:  <6%Goal of Therapy: <7%Additional Action Suggested:  >8%   WOUND CULTURE     Status: Abnormal   Collection Time: 03/21/21  4:42 PM   Specimen: Wound  Result Value Ref Range   MICRO NUMBER: [id number]    SPECIMEN QUALITY: Adequate    SOURCE: WOUND (SITE NOT SPECIFIED)    STATUS: FINAL    GRAM STAIN:      Rare White blood cells seen Few epithelial cells Moderate Gram positive cocci in clusters Many Gram negative bacilli   ISOLATE 1: Staphylococcus aureus (A)     Comment: Heavy growth of Staphylococcus aureus This isolate demonstrates inducible clindamycin resistance.      Susceptibility   Staphylococcus aureus - AEROBIC CULT, GRAM STAIN POSITIVE 1    VANCOMYCIN 1 Sensitive     CIPROFLOXACIN <=0.5 Sensitive     CLINDAMYCIN NR Resistant     LEVOFLOXACIN 0.25 Sensitive     ERYTHROMYCIN >=8 Resistant     GENTAMICIN <=0.5 Sensitive     OXACILLIN* 0.5 Sensitive      * Oxacillin-susceptible staphylococci aresusceptible to other penicillinase-stablepenicillins (e.g. Methicillin, Nafcillin), beta-lactam/beta-lactamase inhibitor combinations, andcephems with staphylococcal indications, includingCefazolin.    TETRACYCLINE <=1 Sensitive  TRIMETH/SULFA* <=10 Sensitive      * Oxacillin-susceptible staphylococci aresusceptible to other penicillinase-stablepenicillins (e.g. Methicillin, Nafcillin), beta-lactam/beta-lactamase inhibitor combinations, andcephems with staphylococcal indications, includingCefazolin.Legend:S = Susceptible  I = IntermediateR = Resistant  NS = Not susceptible* = Not tested  NR = Not reported**NN = See antimicrobic comments    Objective: There were no vitals filed for this visit.  General: Patient is awake, alert, oriented x 3 and in no acute distress.  Dermatology: Skin is warm and dry bilateral with a full thickness ulceration present  Right great toe distal tuft with fibrotic base. Ulceration measures 1.8  cm x 1.5 cm x 0.3 cm. There is a keratotic border with a fibrotic base with sloughing center of fibrotic tissue center of loose skin that is lifting off. The ulceration probes to fatty tissue. There is no malodor, clear to yellow active drainage, focal erythema, focal edema. No acute signs of infection.  To the left second toe there is a small abrasion at the distal toe with no acute signs of infection.   Vascular: Dorsalis Pedis pulse = 2/4 Bilateral,  Posterior Tibial pulse = 1/4 Bilateral,  Capillary Fill Time < 5 seconds  Neurologic: Protective sensation diminished bilateral however patient is hypersensitive to light touch.  Musculosketal: There is no pain with palpation to ulcerated area. No pain with compression to calves bilateral. No gross bony deformities noted bilateral.   No results for input(s): GRAMSTAIN, LABORGA in the last 8760 hours.  Assessment and Plan:  Problem List Items Addressed This Visit      Endocrine   Uncontrolled type 2 diabetes mellitus with hyperglycemia ([place])    Other Visit Diagnoses    Acute osteomyelitis of toe of right foot ([place])    -  Primary   Relevant Orders   MR TOES RIGHT WO CONTRAST   Diabetic ulcer of toe of right foot associated with type 2 diabetes mellitus, with fat layer exposed ([place])       Relevant Orders   MR TOES RIGHT WO CONTRAST   Right foot pain       Relevant Orders   MR TOES RIGHT WO CONTRAST       -Examined patient and discussed the progression of the wound and treatment alternatives. - Excisionally dedbrided ulceration at right 1st toe to healthy bleeding borders removing nonviable tissue using a sterile chisel blade. Wound measures post debridement as above. Wound was debrided to the level of the dermis with viable wound base exposed to promote healing. Hemostasis was achieved with manuel pressure. Patient tolerated procedure well without any discomfort or anesthesia necessary for this wound debridement.  -Applied iodosorb and  dry sterile dressing and instructed patient to continue with daily dressings at home consisting of the same daily  -Advised patient that he may also use a small amount of Iodosorb to the left second toe abrasion and cover with a Band-Aid until it is healed -Continue with post op shoe however today patient was noted to be in normal shoes reports that he wore normal shoes today because he had to ride his motorcycle today's visit -Continue with all antibiotics -Order MRI for further evaluation on right foot for osteomyelitis at right great toe - Advised patient to go to the ER or return to office if the wound worsens or if constitutional symptoms are present. -Patient to return to office after MRI or follow up care and evaluation or sooner if problems arise.  Landis Martins, DPM

## 2021-04-17 ENCOUNTER — Other Ambulatory Visit: Payer: Self-pay | Admitting: Family Medicine

## 2021-04-17 ENCOUNTER — Ambulatory Visit (INDEPENDENT_AMBULATORY_CARE_PROVIDER_SITE_OTHER): Payer: Medicare HMO

## 2021-04-17 ENCOUNTER — Other Ambulatory Visit: Payer: Self-pay

## 2021-04-17 VITALS — BP 155/82 | HR 88 | Temp 98.0°F | Ht 66.0 in | Wt 160.0 lb

## 2021-04-17 DIAGNOSIS — Z0000 Encounter for general adult medical examination without abnormal findings: Secondary | ICD-10-CM

## 2021-04-17 DIAGNOSIS — I10 Essential (primary) hypertension: Secondary | ICD-10-CM

## 2021-04-17 DIAGNOSIS — E1165 Type 2 diabetes mellitus with hyperglycemia: Secondary | ICD-10-CM

## 2021-04-17 NOTE — Patient Instructions (Signed)
[name] PumaCharlesetta [name] , Thank you for taking time to come for your Medicare Wellness Visit. I appreciate your ongoing commitment to your health goals. Please review the following plan we discussed and let me know if I can assist you in the future.    Screening recommendations/referrals: Colonoscopy:  current 09/05/2022 Recommended yearly ophthalmology/optometry visit for glaucoma screening and checkup Recommended yearly dental visit for hygiene and checkup  Vaccinations: Influenza vaccine: due in fall 0222 Pneumococcal vaccine: due 2nd vaccine  Tdap vaccine: current per pt with injury  Shingles vaccine: declines    Advanced directives: declines   Conditions/risks identified: none   Next appointment: 07/25/2021  @930  am with Dr. Volanda Napoleon  / Preventive Care 65 Years and Older, Male Preventive care refers to lifestyle choices and visits with your health care provider that can promote health and wellness. What does preventive care include? A yearly physical exam. This is also called an annual well check. Dental exams once or twice a year. Routine eye exams. Ask your health care provider how often you should have your eyes checked. Personal lifestyle choices, including: Daily care of your teeth and gums. Regular physical activity. Eating a healthy diet. Avoiding tobacco and drug use. Limiting alcohol use. Practicing safe sex. Taking low doses of aspirin every day. Taking vitamin and mineral supplements as recommended by your health care provider. What happens during an annual well check? The services and screenings done by your health care provider during your annual well check will depend on your age, overall health, lifestyle risk factors, and family history of disease. Counseling  Your health care provider may ask you questions about your: Alcohol use. Tobacco use. Drug use. Emotional well-being. Home and relationship well-being. Sexual activity. Eating habits. History of  falls. Memory and ability to understand (cognition). Work and work Statistician. Screening  You may have the following tests or measurements: Height, weight, and BMI. Blood pressure. Lipid and cholesterol levels. These may be checked every 5 years, or more frequently if you are over 49 years old. Skin check. Lung cancer screening. You may have this screening every year starting at age 68 if you have a 30-pack-year history of smoking and currently smoke or have quit within the past 15 years. Fecal occult blood test (FOBT) of the stool. You may have this test every year starting at age 54. Flexible sigmoidoscopy or colonoscopy. You may have a sigmoidoscopy every 5 years or a colonoscopy every 10 years starting at age 80. Prostate cancer screening. Recommendations will vary depending on your family history and other risks. Hepatitis C blood test. Hepatitis B blood test. Sexually transmitted disease (STD) testing. Diabetes screening. This is done by checking your blood sugar (glucose) after you have not eaten for a while (fasting). You may have this done every 1-3 years. Abdominal aortic aneurysm (AAA) screening. You may need this if you are a current or former smoker. Osteoporosis. You may be screened starting at age 74 if you are at high risk. Talk with your health care provider about your test results, treatment options, and if necessary, the need for more tests. Vaccines  Your health care provider may recommend certain vaccines, such as: Influenza vaccine. This is recommended every year. Tetanus, diphtheria, and acellular pertussis (Tdap, Td) vaccine. You may need a Td booster every 10 years. Zoster vaccine. You may need this after age 37. Pneumococcal 13-valent conjugate (PCV13) vaccine. One dose is recommended after age 5. Pneumococcal polysaccharide (PPSV23) vaccine. One dose is recommended after age 36. Talk  to your health care provider about which screenings and vaccines you need and  how often you need them. This information is not intended to replace advice given to you by your health care provider. Make sure you discuss any questions you have with your health care provider. Document Released: 11/09/2015 Document Revised: 07/02/2016 Document Reviewed: 08/14/2015 Elsevier Interactive Patient Education  2017 [employer] Prevention in the Home Falls can cause injuries. They can happen to people of all ages. There are many things you can do to make your home safe and to help prevent falls. What can I do on the outside of my home? Regularly fix the edges of walkways and driveways and fix any cracks. Remove anything that might make you trip as you walk through a door, such as a raised step or threshold. Trim any bushes or trees on the path to your home. Use bright outdoor lighting. Clear any walking paths of anything that might make someone trip, such as rocks or tools. Regularly check to see if handrails are loose or broken. Make sure that both sides of any steps have handrails. Any raised decks and porches should have guardrails on the edges. Have any leaves, snow, or ice cleared regularly. Use sand or salt on walking paths during winter. Clean up any spills in your garage right away. This includes oil or grease spills. What can I do in the bathroom? Use night lights. Install grab bars by the toilet and in the tub and shower. Do not use towel bars as grab bars. Use non-skid mats or decals in the tub or shower. If you need to sit down in the shower, use a plastic, non-slip stool. Keep the floor dry. Clean up any water that spills on the floor as soon as it happens. Remove soap buildup in the tub or shower regularly. Attach bath mats securely with double-sided non-slip rug tape. Do not have throw rugs and other things on the floor that can make you trip. What can I do in the bedroom? Use night lights. Make sure that you have a light by your bed that is easy to  reach. Do not use any sheets or blankets that are too big for your bed. They should not hang down onto the floor. Have a firm chair that has side arms. You can use this for support while you get dressed. Do not have throw rugs and other things on the floor that can make you trip. What can I do in the kitchen? Clean up any spills right away. Avoid walking on wet floors. Keep items that you use a lot in easy-to-reach places. If you need to reach something above you, use a strong step stool that has a grab bar. Keep electrical cords out of the way. Do not use floor polish or wax that makes floors slippery. If you must use wax, use non-skid floor wax. Do not have throw rugs and other things on the floor that can make you trip. What can I do with my stairs? Do not leave any items on the stairs. Make sure that there are handrails on both sides of the stairs and use them. Fix handrails that are broken or loose. Make sure that handrails are as long as the stairways. Check any carpeting to make sure that it is firmly attached to the stairs. Fix any carpet that is loose or worn. Avoid having throw rugs at the top or bottom of the stairs. If you do have throw rugs,  attach them to the floor with carpet tape. Make sure that you have a light switch at the top of the stairs and the bottom of the stairs. If you do not have them, ask someone to add them for you. What else can I do to help prevent falls? Wear shoes that: Do not have high heels. Have rubber bottoms. Are comfortable and fit you well. Are closed at the toe. Do not wear sandals. If you use a stepladder: Make sure that it is fully opened. Do not climb a closed stepladder. Make sure that both sides of the stepladder are locked into place. Ask someone to hold it for you, if possible. Clearly mark and make sure that you can see: Any grab bars or handrails. First and last steps. Where the edge of each step is. Use tools that help you move  around (mobility aids) if they are needed. These include: Canes. Walkers. Scooters. Crutches. Turn on the lights when you go into a dark area. Replace any light bulbs as soon as they burn out. Set up your furniture so you have a clear path. Avoid moving your furniture around. If any of your floors are uneven, fix them. If there are any pets around you, be aware of where they are. Review your medicines with your doctor. Some medicines can make you feel dizzy. This can increase your chance of falling. Ask your doctor what other things that you can do to help prevent falls. This information is not intended to replace advice given to you by your health care provider. Make sure you discuss any questions you have with your health care provider. Document Released: 08/09/2009 Document Revised: 03/20/2016 Document Reviewed: 11/17/2014 Elsevier Interactive Patient Education  2017 [employer].

## 2021-04-17 NOTE — Progress Notes (Signed)
Subjective:   [name] is a 70 y.o. male who presents for an Initial Medicare Annual Wellness Visit.  Review of Systems   n/a       Objective:    There were no vitals filed for this visit. There is no height or weight on file to calculate BMI.  Advanced Directives 02/24/2021 11/13/2020 11/16/2018  Does Patient Have a Medical Advance Directive? No No No  Would patient like information on creating a medical advance directive? - No - Patient declined No - Patient declined    Current Medications (verified) Outpatient Encounter Medications as of 04/17/2021  Medication Sig   atorvastatin (LIPITOR) 40 MG tablet TAKE 1 TABLET BY MOUTH EVERY DAY   blood glucose meter kit and supplies Dispense based on patient and insurance preference. Use to check glucose daily (FOR ICD-10 E10.9, E11.9).   Continuous Blood Gluc Receiver (DEXCOM G6 RECEIVER) DEVI 1 Device by Does not apply route as directed.   Continuous Blood Gluc Sensor (DEXCOM G6 SENSOR) MISC 1 Device by Does not apply route as directed.   Continuous Blood Gluc Transmit (DEXCOM G6 TRANSMITTER) MISC 1 Device by Does not apply route as directed.   ferrous gluconate (FERGON) 324 MG tablet Take 1 tablet (324 mg total) by mouth daily with breakfast.   hydrochlorothiazide (HYDRODIURIL) 12.5 MG tablet Take 1 tablet (12.5 mg total) by mouth daily.   insulin isophane & regular human (NOVOLIN 70/30 FLEXPEN) (70-30) 100 UNIT/ML KwikPen Inject 20 Units into the skin daily before breakfast AND 22 Units at bedtime.   Insulin Pen Needle 31G X 8 MM MISC Use as directed for checking blood sugar 3 times daily, more frequently for hyper or hypoglycemia.   Lancet Devices (ONE TOUCH DELICA LANCING DEV) MISC 1 Device by Does not apply route as directed.   latanoprost (XALATAN) 0.005 % ophthalmic solution SMARTSIG:In Eye(s)   losartan (COZAAR) 100 MG tablet TAKE 1 TABLET BY MOUTH EVERY DAY   MATZIM LA 300 MG 24 hr tablet TAKE 1 TABLET BY MOUTH EVERY DAY    omeprazole (PRILOSEC) 40 MG capsule TAKE 1 CAPSULE BY MOUTH EVERY DAY   ondansetron (ZOFRAN ODT) 4 MG disintegrating tablet 56m ODT q4 hours prn nausea/vomit   OneTouch Delica Lancets 347WMISC 1 Device by Does not apply route as directed. Use to test up to 4x daily.   ONETOUCH ULTRA test strip USE UP TO 4X DAILY   No facility-administered encounter medications on file as of 04/17/2021.    Allergies (verified) Patient has no known allergies.   History: Past Medical History:  Diagnosis Date   Diabetes mellitus without complication ([facility]    Insomnia    [place] ([facility]    Past Surgical History:  Procedure Laterality Date   PROSTATE BIOPSY     Family History  Problem Relation Age of Onset   Colon cancer Mother    Diabetes Brother    Breast cancer Neg Hx    Pancreatic cancer Neg Hx    Prostate cancer Neg Hx    Social History   Socioeconomic History   Marital status: Single    Spouse name: Not on file   Number of children: 2   Years of education: Not on file   Highest education level: Not on file  Occupational History    Comment: retired  Tobacco Use   Smoking status: Never   Smokeless tobacco: Never  Vaping Use   Vaping Use: Never used  Substance and Sexual Activity  Alcohol use: No   Drug use: No   Sexual activity: Yes  Other Topics Concern   Not on file  Social History Narrative   Not on file   Social Determinants of Health   Financial Resource Strain: Not on file  Food Insecurity: Not on file  Transportation Needs: Not on file  Physical Activity: Not on file  Stress: Not on file  Social Connections: Not on file    Tobacco Counseling Counseling given: Not Answered   Clinical Intake:                 Diabetic?yes Nutrition Risk Assessment:  Has the patient had any N/V/D within the last 2 months?  No  Does the patient have any non-healing wounds?  No  Has the patient had any unintentional weight loss or weight gain?  No    Diabetes:  Is the patient diabetic?  Yes  If diabetic, was a CBG obtained today?  No  Did the patient bring in their glucometer from home?  No  How often do you monitor your CBG's? daily.   Financial Strains and Diabetes Management:  Are you having any financial strains with the device, your supplies or your medication? No .  Does the patient want to be seen by Chronic Care Management for management of their diabetes?  No  Would the patient like to be referred to a Nutritionist or for Diabetic Management?  No   Diabetic Exams:  Diabetic Eye Exam: Completed 08/2020 Diabetic Foot Exam: Overdue, Pt has been advised about the importance in completing this exam. Pt is scheduled for diabetic foot exam on next office visit .            Activities of Daily Living No flowsheet data found.  Patient Care Team: Banks, Shannon R, MD as PCP - General (Family Medicine) Robin Bass,RN as Oncology Nurse Navigator  Indicate any recent Medical Services you may have received from other than [facility] providers in the past year (date may be approximate).     Assessment:   This is a routine wellness examination for [name].  Hearing/Vision screen No results found.  Dietary issues and exercise activities discussed:     Goals Addressed   None    Depression Screen PHQ 2/9 Scores 11/16/2018 11/10/2018 06/24/2015  PHQ - 2 Score 0 0 0    Fall Risk Fall Risk  04/04/2020 11/16/2018 06/24/2015  Falls in the past year? 0 0 No  Number falls in past yr: 0 - -  Injury with Fall? 0 - -    FALL RISK PREVENTION PERTAINING TO THE HOME:  Any stairs in or around the home? No  If so, are there any without handrails? Yes  Home free of loose throw rugs in walkways, pet beds, electrical cords, etc? Yes  Adequate lighting in your home to reduce risk of falls? Yes   ASSISTIVE DEVICES UTILIZED TO PREVENT FALLS:  Life alert? No  Use of a cane, walker or w/c? No  Grab bars in the bathroom? No  Shower  chair or bench in shower? No  Elevated toilet seat or a handicapped toilet? No   TIMED UP AND GO:  Was the test performed? Yes .  Length of time to ambulate 10 feet: 10 sec.   Gait steady and fast without use of assistive device  Cognitive Function:  Normal cognitive status assessed by direct observation by this Nurse Health Advisor. No abnormalities found.        Immunizations   Immunization History  Administered Date(s) Administered   PFIZER(Purple Top)SARS-COV-2 Vaccination 08/08/2020   Pneumococcal Polysaccharide-23 02/21/2020    TDAP status: Up to date  Flu Vaccine status: Up to date  Pneumococcal vaccine status: Up to date  Covid-19 vaccine status: Information provided on how to obtain vaccines.   Qualifies for Shingles Vaccine? Yes   Zostavax completed No   Shingrix Completed?: No.    Education has been provided regarding the importance of this vaccine. Patient has been advised to call insurance company to determine out of pocket expense if they have not yet received this vaccine. Advised may also receive vaccine at local pharmacy or Health Dept. Verbalized acceptance and understanding.  Screening Tests Health Maintenance  Topic Date Due   Zoster Vaccines- Shingrix (1 of 2) Never done   COLONOSCOPY (Pts 45-68yr Insurance coverage will need to be confirmed)  Never done   COVID-19 Vaccine (2 - Pfizer risk series) 08/29/2020   PNA vac Low Risk Adult (2 of 2 - PCV13) 02/20/2021   FOOT EXAM  03/22/2021   INFLUENZA VACCINE  05/27/2021   OPHTHALMOLOGY EXAM  07/13/2021   HEMOGLOBIN A1C  09/20/2021   Hepatitis C Screening  Completed   HPV VACCINES  Aged Out   TETANUS/TDAP  Discontinued    Health Maintenance  Health Maintenance Due  Topic Date Due   Zoster Vaccines- Shingrix (1 of 2) Never done   COLONOSCOPY (Pts 45-458yrInsurance coverage will need to be confirmed)  Never done   COVID-19 Vaccine (2 - Pfizer risk series) 08/29/2020   PNA vac Low Risk Adult (2 of  2 - PCV13) 02/20/2021   FOOT EXAM  03/22/2021    Colorectal cancer screening: Type of screening: Cologuard. Completed 09/05/2020. Repeat every 2 years  Lung Cancer Screening: (Low Dose CT Chest recommended if Age 69-80ears, 30 pack-year currently smoking OR have quit w/in 15years.) does not qualify.   Lung Cancer Screening Referral: n/a  Additional Screening:  Hepatitis C Screening: does not qualify; Completed 01/22/2016  Vision Screening: Recommended annual ophthalmology exams for early detection of glaucoma and other disorders of the eye. Is the patient up to date with their annual eye exam?  Yes  Who is the provider or what is the name of the office in which the patient attends annual eye exams? [facility] provider, would they like to be referred to a provider to establish care? No .   Dental Screening: Recommended annual dental exams for proper oral hygiene  Community Resource Referral / Chronic Care Management: CRR required this visit?  No   CCM required this visit?  No      Plan:     I have personally reviewed and noted the following in the patient's chart:   Medical and social history Use of alcohol, tobacco or illicit drugs  Current medications and supplements including opioid prescriptions. Patient is not currently taking opioid prescriptions. Functional ability and status Nutritional status Physical activity Advanced directives List of other physicians Hospitalizations, surgeries, and ER visits in previous 12 months Vitals Screenings to include cognitive, depression, and falls Referrals and appointments  In addition, I have reviewed and discussed with patient certain preventive protocols, quality metrics, and best practice recommendations. A written personalized care plan for preventive services as well as general preventive health recommendations were provided to patient.     LaRandel PiggLPN   04/03/45/9629 Nurse  Notes: none

## 2021-04-24 ENCOUNTER — Encounter: Payer: Self-pay | Admitting: Family Medicine

## 2021-04-24 ENCOUNTER — Ambulatory Visit (INDEPENDENT_AMBULATORY_CARE_PROVIDER_SITE_OTHER): Payer: Medicare HMO | Admitting: Family Medicine

## 2021-04-24 ENCOUNTER — Other Ambulatory Visit: Payer: Self-pay

## 2021-04-24 VITALS — BP 160/90 | HR 92 | Temp 98.0°F | Wt 161.2 lb

## 2021-04-24 DIAGNOSIS — N1831 Chronic kidney disease, stage 3a: Secondary | ICD-10-CM | POA: Diagnosis not present

## 2021-04-24 DIAGNOSIS — E11621 Type 2 diabetes mellitus with foot ulcer: Secondary | ICD-10-CM

## 2021-04-24 DIAGNOSIS — E1165 Type 2 diabetes mellitus with hyperglycemia: Secondary | ICD-10-CM | POA: Diagnosis not present

## 2021-04-24 DIAGNOSIS — L97519 Non-pressure chronic ulcer of other part of right foot with unspecified severity: Secondary | ICD-10-CM | POA: Diagnosis not present

## 2021-04-24 DIAGNOSIS — I10 Essential (primary) hypertension: Secondary | ICD-10-CM

## 2021-04-24 MED ORDER — NOVOLIN 70/30 FLEXPEN (70-30) 100 UNIT/ML ~~LOC~~ SUPN: PEN_INJECTOR | SUBCUTANEOUS | 11 refills | Status: DC

## 2021-04-24 MED ORDER — DILTIAZEM HCL ER COATED BEADS 360 MG PO TB24: 360.0000 mg | ORAL_TABLET | Freq: Every day | ORAL | 3 refills | Status: DC

## 2021-04-24 NOTE — Patient Instructions (Signed)
You should call and schedule a follow up appointment with your Urologist.  You should also contact your Podiatrist about rescheduling your MRI appointment.  Today we discussed increasing your evening dose of insulin from 22 units to 24 units.  You are to continue 20 units in a.m.  It is also important that you decrease the amount of sweets and carbohydrates you are eating.  We also increased her dose of diltiazem from 300 mg to 360 mg daily.  Continue losartan 100 mg and hydrochlorothiazide 12.5 mg daily.

## 2021-04-30 ENCOUNTER — Telehealth: Payer: Self-pay | Admitting: *Deleted

## 2021-04-30 NOTE — Chronic Care Management (AMB) (Signed)
  Chronic Care Management   Note  04/30/2021 Name: [name] MRN: [id number] DOB: 1951/10/07  [name] is a 70 y.o. year old male who is a primary care patient of [name], MD. I reached out to [employer] by phone today in response to a referral sent by [name]'s PCP, Dr. Volanda Napoleon.      [name] was given information about Chronic Care Management services today including:  CCM service includes personalized support from designated clinical staff supervised by his physician, including individualized plan of care and coordination with other care providers 24/7 contact phone numbers for assistance for urgent and routine care needs. Service will only be billed when office clinical staff spend 20 minutes or more in a month to coordinate care. Only one practitioner may furnish and bill the service in a calendar month. The patient may stop CCM services at any time (effective at the end of the month) by phone call to the office staff. The patient will be responsible for cost sharing (co-pay) of up to 20% of the service fee (after annual deductible is met).  Patient agreed to services and verbal consent obtained.   Follow up plan: Telephone appointment with care management team member scheduled for:05/09/2021  [facility]

## 2021-05-02 ENCOUNTER — Other Ambulatory Visit: Payer: Self-pay

## 2021-05-02 ENCOUNTER — Telehealth: Payer: Self-pay | Admitting: *Deleted

## 2021-05-02 ENCOUNTER — Ambulatory Visit
Admission: RE | Admit: 2021-05-02 | Discharge: 2021-05-02 | Disposition: A | Discharge status: Home / Self Care | Payer: Medicare HMO | Source: Ambulatory Visit | Attending: Sports Medicine | Admitting: Sports Medicine

## 2021-05-02 DIAGNOSIS — M7989 Other specified soft tissue disorders: Secondary | ICD-10-CM | POA: Diagnosis not present

## 2021-05-02 DIAGNOSIS — L97512 Non-pressure chronic ulcer of other part of right foot with fat layer exposed: Secondary | ICD-10-CM

## 2021-05-02 DIAGNOSIS — R600 Localized edema: Secondary | ICD-10-CM | POA: Diagnosis not present

## 2021-05-02 DIAGNOSIS — M79671 Pain in right foot: Secondary | ICD-10-CM

## 2021-05-02 DIAGNOSIS — M86171 Other acute osteomyelitis, right ankle and foot: Secondary | ICD-10-CM

## 2021-05-02 DIAGNOSIS — L97519 Non-pressure chronic ulcer of other part of right foot with unspecified severity: Secondary | ICD-10-CM | POA: Diagnosis not present

## 2021-05-02 DIAGNOSIS — E11621 Type 2 diabetes mellitus with foot ulcer: Secondary | ICD-10-CM

## 2021-05-02 NOTE — Telephone Encounter (Signed)
[facility] is calling with results of MRl right toes-soft tissue ulceration at tip of great toe with underline osteomyelitis of first distal phalanx, no abcess.faxed  notes will follow this report.

## 2021-05-09 ENCOUNTER — Ambulatory Visit (INDEPENDENT_AMBULATORY_CARE_PROVIDER_SITE_OTHER): Payer: Medicare HMO

## 2021-05-09 DIAGNOSIS — L97519 Non-pressure chronic ulcer of other part of right foot with unspecified severity: Secondary | ICD-10-CM | POA: Diagnosis not present

## 2021-05-09 DIAGNOSIS — I10 Essential (primary) hypertension: Secondary | ICD-10-CM

## 2021-05-09 DIAGNOSIS — E785 Hyperlipidemia, unspecified: Secondary | ICD-10-CM

## 2021-05-09 DIAGNOSIS — E1142 Type 2 diabetes mellitus with diabetic polyneuropathy: Secondary | ICD-10-CM | POA: Diagnosis not present

## 2021-05-09 DIAGNOSIS — E11621 Type 2 diabetes mellitus with foot ulcer: Secondary | ICD-10-CM

## 2021-05-09 DIAGNOSIS — E1169 Type 2 diabetes mellitus with other specified complication: Secondary | ICD-10-CM | POA: Diagnosis not present

## 2021-05-09 NOTE — Chronic Care Management (AMB) (Signed)
Chronic Care Management   CCM RN Visit Note  05/09/2021 Name: [name] MRN: [id number] DOB: [birth year]  Subjective: [name] is a 70 y.o. year old male who is a primary care patient of [name], MD. The care management team was consulted for assistance with disease management and care coordination needs.    Engaged with patient by telephone for initial visit in response to provider referral for case management and/or care coordination services.   Consent to Services:  The patient was given the following information about Chronic Care Management services today, agreed to services, and gave verbal consent: 1. CCM service includes personalized support from designated clinical staff supervised by the primary care provider, including individualized plan of care and coordination with other care providers 2. 24/7 contact phone numbers for assistance for urgent and routine care needs. 3. Service will only be billed when office clinical staff spend 20 minutes or more in a month to coordinate care. 4. Only one practitioner may furnish and bill the service in a calendar month. 5.The patient may stop CCM services at any time (effective at the end of the month) by phone call to the office staff. 6. The patient will be responsible for cost sharing (co-pay) of up to 20% of the service fee (after annual deductible is met). Patient agreed to services and consent obtained.  Patient agreed to services and verbal consent obtained.   Assessment: Review of patient past medical history, allergies, medications, health status, including review of consultants reports, laboratory and other test data, was performed as part of comprehensive evaluation and provision of chronic care management services.   SDOH (Social Determinants of Health) assessments and interventions performed:  SDOH Interventions    Flowsheet Row Most Recent Value  SDOH Interventions   Food Insecurity Interventions Intervention  Not Indicated  Housing Interventions Intervention Not Indicated  Transportation Interventions Intervention Not Indicated        CCM Care Plan  No Known Allergies  Outpatient Encounter Medications as of 05/09/2021  Medication Sig   atorvastatin (LIPITOR) 40 MG tablet TAKE 1 TABLET BY MOUTH EVERY DAY   blood glucose meter kit and supplies Dispense based on patient and insurance preference. Use to check glucose daily (FOR ICD-10 E10.9, E11.9).   Continuous Blood Gluc Receiver (DEXCOM G6 RECEIVER) DEVI 1 Device by Does not apply route as directed.   Continuous Blood Gluc Sensor (DEXCOM G6 SENSOR) MISC 1 Device by Does not apply route as directed.   Continuous Blood Gluc Transmit (DEXCOM G6 TRANSMITTER) MISC 1 Device by Does not apply route as directed.   diltiazem (CARDIZEM LA) 360 MG 24 hr tablet Take 1 tablet (360 mg total) by mouth daily.   ferrous gluconate (FERGON) 324 MG tablet Take 1 tablet (324 mg total) by mouth daily with breakfast.   hydrochlorothiazide (HYDRODIURIL) 12.5 MG tablet Take 1 tablet (12.5 mg total) by mouth daily.   insulin isophane & regular human (NOVOLIN 70/30 FLEXPEN) (70-30) 100 UNIT/ML KwikPen Inject 20 Units into the skin daily before breakfast AND 24 Units at bedtime.   Insulin Pen Needle 31G X 8 MM MISC Use as directed for checking blood sugar 3 times daily, more frequently for hyper or hypoglycemia.   Lancet Devices (ONE TOUCH DELICA LANCING DEV) MISC 1 Device by Does not apply route as directed.   latanoprost (XALATAN) 0.005 % ophthalmic solution SMARTSIG:In Eye(s)   losartan (COZAAR) 100 MG tablet TAKE 1 TABLET BY MOUTH EVERY DAY   omeprazole (PRILOSEC) 40 MG  capsule TAKE 1 CAPSULE BY MOUTH EVERY DAY   ondansetron (ZOFRAN ODT) 4 MG disintegrating tablet 3m ODT q4 hours prn nausea/vomit   OneTouch Delica Lancets 329BMISC 1 Device by Does not apply route as directed. Use to test up to 4x daily.   ONETOUCH ULTRA test strip USE UP TO 4X DAILY   No  facility-administered encounter medications on file as of 05/09/2021.    Patient Active Problem List   Diagnosis Date Noted   Delayed sleep phase syndrome 11/13/2020   [place]state cancer ([facility] 09/16/2020   Severe nonproliferative diabetic retinopathy of right eye with macular edema associated with type 2 diabetes mellitus ([facility] 08/28/2020   Age-related nuclear cataract, bilateral 08/28/2020   Hypertensive retinopathy of both eyes 08/28/2020   Bilateral carotid artery stenosis 08/28/2020   Murmur 08/28/2020   Diabetic macular edema ([facility] 07/13/2020   Mild nonproliferative diabetic retinopathy of right eye with macular edema associated with type 2 diabetes mellitus ([facility] 07/13/2020   BPH associated with nocturia 02/21/2020   Type 2 diabetes mellitus with diabetic polyneuropathy, without long-term current use of insulin ([facility] 07/06/2019   Dyslipidemia 07/06/2019   ED (erectile dysfunction) 12/24/2018   Osteoarthritis 11/25/2018   Uncontrolled type 2 diabetes mellitus with hyperglycemia ([facility] 11/12/2018   Hyperlipidemia associated with type 2 diabetes mellitus ([facility] 11/12/2018   Essential hypertension 11/10/2018   Insomnia 11/10/2018   Diabetes ([facility] [facility] 01/23/2016   Depression (emotion) 01/22/2016    Conditions to be addressed/monitored:HTN, HLD, DMII, and diabetic foot ulcer  Care Plan : Diabetes Type 2 (Adult)  Updates made by SDimitri Ped RN since 05/09/2021 12:00 AM     Problem: Lack of self management of Type 2 Diabetes   Priority: High     Long-Range Goal: Effective Self Management of Type 2 Diabetes   Start Date: 05/09/2021  Expected End Date: 09/26/2021  This Visit's Progress: On track  Priority: High  Note:   Objective:  Lab Results  Component Value Date   HGBA1C 7.3 (H) 03/20/2021   Lab Results  Component Value Date   CREATININE 1.34 03/20/2021   CREATININE 2.11 (H) 02/24/2021   CREATININE 1.35 (H) 09/07/2020   No results found for: EGFR Current Barriers:   Knowledge Deficits related to basic Diabetes pathophysiology and self care/management Knowledge Deficits related to medications used for management of diabetes Unable to independently self manage Type 2 diabetes and diabetic foot ulcer Does not adhere to provider recommendations re: diet and frequency of CBG monitoring Pt states that his CBGs range from 140-200 in the AM and the 120's later in the day.  States he does not always check his CBG 4 times a day and will only check if he feels it is too high or low.  Denies any hypoglycemia but states will start to feel low it CBG is 80-90.  States he tries to watch what he eat but sometimes likes something sweet.  States he tries to cook at home most of the time. States he is very active working on houses and cars.  States his foot ulcer is healing and has no drainage now.  States he has a video visit with podiatry tomorrow Case Manager Clinical Goal(s):  patient will demonstrate improved adherence to prescribed treatment plan for diabetes self care/management as evidenced by: daily monitoring and recording of CBG  adherence to ADA/ carb modified diet adherence to prescribed medication regimen contacting provider for new or worsened symptoms or questions Interventions:  Collaboration with BBillie Ruddy MD  regarding development and update of comprehensive plan of care as evidenced by provider attestation and co-signature Inter-disciplinary care team collaboration (see longitudinal plan of care) Provided education to patient about basic DM disease process Reviewed medications with patient and discussed importance of medication adherence Discussed plans with patient for ongoing care management follow up and provided patient with direct contact information for care management team Provided patient with written educational materials related to hypo and hyperglycemia and importance of correct treatment Reviewed scheduled/upcoming provider appointments  including: Dr. Cannon Kettle Podiatry 05/10/21, Dr. Volanda Napoleon 05/24/21 Advised patient, providing education and rationale, to check cbg before meals and at bedtime and record, calling provider for findings outside established parameters.   Review of patient status, including review of consultants reports, relevant laboratory and other test results, and medications completed. Reviewed fasting blood sugar goals of 80-130 and less than 180 1 1/2-2 hours after meals Reviewed importance of daily  foot care and to discuss with podiatry about getting diabetic shoes Self-Care Activities - Self administers oral medications as prescribed Self administers insulin as prescribed Attends all scheduled provider appointments Checks blood sugars as prescribed and utilize hyper and hypoglycemia protocol as needed Adheres to prescribed ADA/carb modified Patient Goals: - check blood sugar at prescribed times - check blood sugar before and after exercise - check blood sugar if I feel it is too high or too low - enter blood sugar readings and medication or insulin into daily log - take the blood sugar log to all doctor visits - take the blood sugar meter to all doctor visits - change to whole grain breads, cereal, pasta - drink 6 to 8 glasses of water each day - fill half of plate with vegetables - limit fast food meals to no more than 1 per week - manage portion size - prepare main meal at home 3 to 5 days each week - read food labels for fat, fiber, carbohydrates and portion size - switch to low-fat or skim milk - switch to sugar-free drinks - schedule appointment with eye doctor - check feet daily for cuts, sores or redness - keep feet up while sitting - trim toenails straight across - wash and dry feet carefully every day - wear comfortable, cotton socks - wear comfortable, well-fitting shoes -keep appointments with podiatry Follow Up Plan: Telephone follow up appointment with care management team member  scheduled for: 06/07/21 at 11:30 PM The patient has been provided with contact information for the care management team and has been advised to call with any health related questions or concerns.      Care Plan : Hypertension (Adult)  Updates made by [name], RN since 05/09/2021 12:00 AM     Problem: Hypertension   Priority: Medium     Long-Range Goal: Effective self management of Hypertension   Start Date: 05/09/2021  Expected End Date: 09/26/2021  This Visit's Progress: On track  Priority: Medium  Note:   Objective:  Last practice recorded BP readings:  BP Readings from Last 3 Encounters:  04/24/21 (!) 160/90  04/17/21 (!) 155/82  03/20/21 (!) 180/80   Most recent eGFR/CrCl: No results found for: EGFR  No components found for: CRCL Current Barriers:  Knowledge Deficits related to basic understanding of hypertension pathophysiology and self care management Unable to independently self manage hypertension with hx of HLD, DM2, diabetic foot ulcer, prostate CA Does not adhere to provider recommendations re: diet and B/P monitoring Pt states that his B/P is always up with he goes  to the doctor.  States that when he checks at home it is usually in the 120s/80s.  States he takes his B/P medications as ordered and tries to eat health most of the time.  States he is very active working on houses and cars  Case Electrical engineer):  patient will verbalize understanding of plan for hypertension management patient will attend all scheduled medical appointments: Dr. Cannon Kettle podiatry 05/10/21, Dr. Volanda Napoleon 05/24/21 patient will demonstrate improved adherence to prescribed treatment plan for hypertension as evidenced by taking all medications as prescribed, monitoring and recording blood pressure as directed, adhering to low sodium/DASH diet patient will demonstrate improved health management independence as evidenced by checking blood pressure as directed and notifying PCP if SBP>160 or  DBP > 90, taking all medications as prescribe, and adhering to a low sodium diet as discussed. Interventions:  Collaboration with [name], MD regarding development and update of comprehensive plan of care as evidenced by provider attestation and co-signature Inter-disciplinary care team collaboration (see longitudinal plan of care) Evaluation of current treatment plan related to hypertension self management and patient's adherence to plan as established by provider. Provided education to patient re: stroke prevention, s/s of heart attack and stroke, DASH diet, complications of uncontrolled blood pressure Reviewed medications with patient and discussed importance of compliance Discussed plans with patient for ongoing care management follow up and provided patient with direct contact information for care management team Advised patient, providing education and rationale, to monitor blood pressure daily and record, calling PCP for findings outside established parameters.  Reviewed scheduled/upcoming provider appointments including: Dr. Cannon Kettle podiatry 05/10/21, Dr. Volanda Napoleon 05/24/21 Reviewed lifestyle modification - low sodium diet, smoking cessation, weight control. medication compliance, exercise and stress Self-Care Activities: - Self administers medications as prescribed Attends all scheduled provider appointments Calls provider office for new concerns, questions, or BP outside discussed parameters Checks BP and records as discussed Follows a low sodium diet/DASH diet Patient Goals: - check blood pressure daily - choose a place to take my blood pressure (home, clinic or office, retail store) - write blood pressure results in a log or diary - ask questions to understand  Follow Up Plan: Telephone follow up appointment with care management team member scheduled for: 06/07/21 The patient has been provided with contact information for the care management team and has been advised to call with  any health related questions or concerns.       Plan:Telephone follow up appointment with care management team member scheduled for:  06/07/21 and The patient has been provided with contact information for the care management team and has been advised to call with any health related questions or concerns.  Peter Garter RN, Jackquline Denmark, CDE Care Management Coordinator [facility] [phone], Mobile [phone]

## 2021-05-09 NOTE — Patient Instructions (Signed)
Visit Information   PATIENT GOALS:   Goals Addressed             This Visit's Progress    RNCM:Monitor and Manage My Blood Sugar-Diabetes Type 2       Timeframe:  Long-Range Goal Priority:  High Start Date:       05/09/21                      Expected End Date:   09/26/21                    Follow Up Date 06/07/21    - check blood sugar at prescribed times - check blood sugar before and after exercise - check blood sugar if I feel it is too high or too low - enter blood sugar readings and medication or insulin into daily log - take the blood sugar log to all doctor visits - take the blood sugar meter to all doctor visits    Why is this important?   Checking your blood sugar at home helps to keep it from getting very high or very low.  Writing the results in a diary or log helps the doctor know how to care for you.  Your blood sugar log should have the time, date and the results.  Also, write down the amount of insulin or other medicine that you take.  Other information, like what you ate, exercise done and how you were feeling, will also be helpful.     Notes:      RNCM:Perform Foot Care-Diabetes Type 2       Timeframe:  Long-Range Goal Priority:  High Start Date:         05/09/21                    Expected End Date:     09/26/21                  Follow Up Date 06/07/21    - check feet daily for cuts, sores or redness - keep feet up while sitting - trim toenails straight across - wash and dry feet carefully every day - wear comfortable, cotton socks - wear comfortable, well-fitting shoes -keep appointments with podiatrist    Why is this important?   Good foot care is very important when you have diabetes.  There are many things you can do to keep your feet healthy and catch a problem early.    Notes:      RNCM:Track and Manage My Blood Pressure-Hypertension       Timeframe:  Long-Range Goal Priority:  Medium Start Date:     05/09/21                         Expected End Date:    09/26/21                   Follow Up Date 06/07/21    - check blood pressure daily - choose a place to take my blood pressure (home, clinic or office, retail store) - write blood pressure results in a log or diary    Why is this important?   You won't feel high blood pressure, but it can still hurt your blood vessels.  High blood pressure can cause heart or kidney problems. It can also cause a stroke.  Making lifestyle changes like losing a little weight or  eating less salt will help.  Checking your blood pressure at home and at different times of the day can help to control blood pressure.  If the doctor prescribes medicine remember to take it the way the doctor ordered.  Call the office if you cannot afford the medicine or if there are questions about it.     Notes:       Diabetes Mellitus Action Plan Following a diabetes action plan is a way for you to manage your diabetes (diabetes mellitus) symptoms. The plan is color-coded to help you understand what actions you need to take based on any symptoms you are having. If you have symptoms in the red zone, you need medical care right away. If you have symptoms in the yellow zone, you are having problems. If you have symptoms in the green zone, you are doing well. Learning about and understanding diabetes can take time. Follow the plan that you develop with your health care provider. Know the target range for your blood sugar (glucose) level, and review your treatment plan with your health care provider at eachvisit. The target range for my blood sugar level is __________________________ mg/dL. Red zone Get medical help right away if you have any of the following symptoms: A blood sugar test result that is below 54 mg/dL (3 mmol/L). A blood sugar test result that is at or above 240 mg/dL (13.3 mmol/L) for 2 days in a row. Confusion or trouble thinking clearly. Difficulty breathing. Sickness or a fever for 2 or  more days that is not getting better. Moderate or large ketone levels in your urine. Feeling tired or having no energy. If you have any red zone symptoms, do not wait to see if the symptoms will go away. Get medical help right away. Call your local emergency services (911 in the [place] Do not drive yourself to the hospital. If you have severely low blood sugar (severe hypoglycemia) and you cannot eat or drink, you may need glucagon. Make sure a family member or close friend knows how to check your blood sugar and how to give youglucagon. You may need to be treated in a hospital for this condition. Yellow zone If you have any of the following symptoms, your diabetes is not under control and you may need to make some changes: A blood sugar test result that is at or above 240 mg/dL (13.3 mmol/L) for 2 days in a row. Blood sugar test results that are below 70 mg/dL (3.9 mmol/L). Other symptoms of hypoglycemia, such as: Shaking or feeling light-headed. Confusion or irritability. Feeling hungry. Having a fast heartbeat. If you have any yellow zone symptoms: Treat your hypoglycemia by eating or drinking 15 grams of a rapid-acting carbohydrate. Follow the 15:15 rule: Take 15 grams of a rapid-acting carbohydrate, such as: 1 tube of glucose gel. 4 glucose pills. 4 oz (120 mL) of fruit juice. 4 oz (120 mL) of regular (not diet) soda. Check your blood sugar 15 minutes after you take the carbohydrate. If the repeat blood sugar test is still at or below 70 mg/dL (3.9 mmol/L), take 15 grams of a carbohydrate again. If your blood sugar does not increase above 70 mg/dL (3.9 mmol/L) after 3 tries, get medical help right away. After your blood sugar returns to normal, eat a meal or a snack within 1 hour. Keep taking your daily medicines as told by your health care provider. Check your blood sugar more often than you normally would. Write down your results. Call  your health care provider if you have trouble  keeping your blood sugar in your target range.  Green zone These signs mean you are doing well and you can continue what you are doing to manage your diabetes: Your blood sugar is within your personal target range. For most people, a blood sugar level before a meal (preprandial) should be 80-130 mg/dL (4.4-7.2 mmol/L). You feel well, and you are able to do daily activities. If you are in the green zone, continue to manage your diabetes as told by your health care provider. To do this: Eat a healthy diet. Exercise regularly. Check your blood sugar as told by your health care provider. Take your medicines as told by your health care provider.  Where to find more information American Diabetes Association (ADA): diabetes.org Association of Diabetes Care & Education Specialists (ADCES): diabeteseducator.org Summary Following a diabetes action plan is a way for you to manage your diabetes symptoms. The plan is color-coded to help you understand what actions you need to take based on any symptoms you are having. Follow the plan that you develop with your health care provider. Make sure you know your personal target blood sugar level. Review your treatment plan with your health care provider at each visit. This information is not intended to replace advice given to you by your health care provider. Make sure you discuss any questions you have with your healthcare provider. Document Revised: 04/19/2020 Document Reviewed: 04/19/2020 Elsevier Patient Education  [place]. Diabetes Mellitus and [place] care is an important part of your health, especially when you have diabetes. Diabetes may cause you to have problems because of poor blood flow (circulation) to your feet and legs, which can cause your skin to: Become thinner and drier. Break more easily. Heal more slowly. Peel and crack. You may also have nerve damage (neuropathy) in your legs and feet, causing decreased feeling in  them. This means that you may not notice minor injuries to your feet that could lead to more serious problems. Noticing and addressing any potential problems early is the best wayto prevent future foot problems. How to care for your feet Foot hygiene  Wash your feet daily with warm water and mild soap. Do not use hot water. Then, pat your feet and the areas between your toes until they are completely dry. Do not soak your feet as this can dry your skin. Trim your toenails straight across. Do not dig under them or around the cuticle. File the edges of your nails with an emery board or nail file. Apply a moisturizing lotion or petroleum jelly to the skin on your feet and to dry, brittle toenails. Use lotion that does not contain alcohol and is unscented. Do not apply lotion between your toes.  Shoes and socks Wear clean socks or stockings every day. Make sure they are not too tight. Do not wear knee-high stockings since they may decrease blood flow to your legs. Wear shoes that fit properly and have enough cushioning. Always look in your shoes before you put them on to be sure there are no objects inside. To break in new shoes, wear them for just a few hours a day. This prevents injuries on your feet. Wounds, scrapes, corns, and calluses  Check your feet daily for blisters, cuts, bruises, sores, and redness. If you cannot see the bottom of your feet, use a mirror or ask someone for help. Do not cut corns or calluses or try to remove them  with medicine. If you find a minor scrape, cut, or break in the skin on your feet, keep it and the skin around it clean and dry. You may clean these areas with mild soap and water. Do not clean the area with peroxide, alcohol, or iodine. If you have a wound, scrape, corn, or callus on your foot, look at it several times a day to make sure it is healing and not infected. Check for: Redness, swelling, or pain. Fluid or blood. Warmth. Pus or a bad smell.  General  tips Do not cross your legs. This may decrease blood flow to your feet. Do not use heating pads or hot water bottles on your feet. They may burn your skin. If you have lost feeling in your feet or legs, you may not know this is happening until it is too late. Protect your feet from hot and cold by wearing shoes, such as at the beach or on hot pavement. Schedule a complete foot exam at least once a year (annually) or more often if you have foot problems. Report any cuts, sores, or bruises to your health care provider immediately. Where to find more information American Diabetes Association: www.diabetes.org Association of Diabetes Care & Education Specialists: www.diabeteseducator.org Contact a health care provider if: You have a medical condition that increases your risk of infection and you have any cuts, sores, or bruises on your feet. You have an injury that is not healing. You have redness on your legs or feet. You feel burning or tingling in your legs or feet. You have pain or cramps in your legs and feet. Your legs or feet are numb. Your feet always feel cold. You have pain around any toenails. Get help right away if: You have a wound, scrape, corn, or callus on your foot and: You have pain, swelling, or redness that gets worse. You have fluid or blood coming from the wound, scrape, corn, or callus. Your wound, scrape, corn, or callus feels warm to the touch. You have pus or a bad smell coming from the wound, scrape, corn, or callus. You have a fever. You have a red line going up your leg. Summary Check your feet every day for blisters, cuts, bruises, sores, and redness. Apply a moisturizing lotion or petroleum jelly to the skin on your feet and to dry, brittle toenails. Wear shoes that fit properly and have enough cushioning. If you have foot problems, report any cuts, sores, or bruises to your health care provider immediately. Schedule a complete foot exam at least once a year  (annually) or more often if you have foot problems. This information is not intended to replace advice given to you by your health care provider. Make sure you discuss any questions you have with your healthcare provider. Document Revised: 05/03/2020 Document Reviewed: 05/03/2020 Elsevier Patient Education  2022 [employer].   Consent to CCM Services: [name] was given information about Chronic Care Management services today including:  CCM service includes personalized support from designated clinical staff supervised by his physician, including individualized plan of care and coordination with other care providers 24/7 contact phone numbers for assistance for urgent and routine care needs. Service will only be billed when office clinical staff spend 20 minutes or more in a month to coordinate care. Only one practitioner may furnish and bill the service in a calendar month. The patient may stop CCM services at any time (effective at the end of the month) by phone call to the office  staff. The patient will be responsible for cost sharing (co-pay) of up to 20% of the service fee (after annual deductible is met).  Patient agreed to services and verbal consent obtained.   Patient verbalizes understanding of instructions provided today and agrees to view in [place].   Telephone follow up appointment with care management team member scheduled for: 06/07/21  Peter Garter RN, [facility], CDE Care Management Coordinator [facility] [phone], Mobile [phone]   CLINICAL CARE PLAN: Patient Care Plan: Diabetes Type 2 (Adult)     Problem Identified: Lack of self management of Type 2 Diabetes   Priority: High     Long-Range Goal: Effective Self Management of Type 2 Diabetes   Start Date: 05/09/2021  Expected End Date: 09/26/2021  This Visit's Progress: On track  Priority: High  Note:   Objective:  Lab Results  Component Value Date   HGBA1C 7.3 (H)  03/20/2021   Lab Results  Component Value Date   CREATININE 1.34 03/20/2021   CREATININE 2.11 (H) 02/24/2021   CREATININE 1.35 (H) 09/07/2020   No results found for: EGFR Current Barriers:  Knowledge Deficits related to basic Diabetes pathophysiology and self care/management Knowledge Deficits related to medications used for management of diabetes Unable to independently self manage Type 2 diabetes and diabetic foot ulcer Does not adhere to provider recommendations re: diet and frequency of CBG monitoring Pt states that his CBGs range from 140-200 in the AM and the 120's later in the day.  States he does not always check his CBG 4 times a day and will only check if he feels it is too high or low.  Denies any hypoglycemia but states will start to feel low it CBG is 80-90.  States he tries to watch what he eat but sometimes likes something sweet.  States he tries to cook at home most of the time. States he is very active working on houses and cars.  States his foot ulcer is healing and has no drainage now.  States he has a video visit with podiatry tomorrow Case Manager Clinical Goal(s):  patient will demonstrate improved adherence to prescribed treatment plan for diabetes self care/management as evidenced by: daily monitoring and recording of CBG  adherence to ADA/ carb modified diet adherence to prescribed medication regimen contacting provider for new or worsened symptoms or questions Interventions:  Collaboration with [name], MD regarding development and update of comprehensive plan of care as evidenced by provider attestation and co-signature Inter-disciplinary care team collaboration (see longitudinal plan of care) Provided education to patient about basic DM disease process Reviewed medications with patient and discussed importance of medication adherence Discussed plans with patient for ongoing care management follow up and provided patient with direct contact information for  care management team Provided patient with written educational materials related to hypo and hyperglycemia and importance of correct treatment Reviewed scheduled/upcoming provider appointments including: Dr. Cannon Kettle Podiatry 05/10/21, Dr. Volanda Napoleon 05/24/21 Advised patient, providing education and rationale, to check cbg before meals and at bedtime and record, calling provider for findings outside established parameters.   Review of patient status, including review of consultants reports, relevant laboratory and other test results, and medications completed. Reviewed fasting blood sugar goals of 80-130 and less than 180 1 1/2-2 hours after meals Reviewed importance of daily  foot care and to discuss with podiatry about getting diabetic shoes Self-Care Activities - Self administers oral medications as prescribed Self administers insulin as prescribed Attends all scheduled provider appointments Checks  blood sugars as prescribed and utilize hyper and hypoglycemia protocol as needed Adheres to prescribed ADA/carb modified Patient Goals: - check blood sugar at prescribed times - check blood sugar before and after exercise - check blood sugar if I feel it is too high or too low - enter blood sugar readings and medication or insulin into daily log - take the blood sugar log to all doctor visits - take the blood sugar meter to all doctor visits - change to whole grain breads, cereal, pasta - drink 6 to 8 glasses of water each day - fill half of plate with vegetables - limit fast food meals to no more than 1 per week - manage portion size - prepare main meal at home 3 to 5 days each week - read food labels for fat, fiber, carbohydrates and portion size - switch to low-fat or skim milk - switch to sugar-free drinks - schedule appointment with eye doctor - check feet daily for cuts, sores or redness - keep feet up while sitting - trim toenails straight across - wash and dry feet carefully every day -  wear comfortable, cotton socks - wear comfortable, well-fitting shoes -keep appointments with podiatry Follow Up Plan: Telephone follow up appointment with care management team member scheduled for: 06/07/21 at 11:30 PM The patient has been provided with contact information for the care management team and has been advised to call with any health related questions or concerns.      Patient Care Plan: Hypertension (Adult)     Problem Identified: Hypertension   Priority: Medium     Long-Range Goal: Effective self management of Hypertension   Start Date: 05/09/2021  Expected End Date: 09/26/2021  This Visit's Progress: On track  Priority: Medium  Note:   Objective:  Last practice recorded BP readings:  BP Readings from Last 3 Encounters:  04/24/21 (!) 160/90  04/17/21 (!) 155/82  03/20/21 (!) 180/80   Most recent eGFR/CrCl: No results found for: EGFR  No components found for: CRCL Current Barriers:  Knowledge Deficits related to basic understanding of hypertension pathophysiology and self care management Unable to independently self manage hypertension with hx of HLD, DM2, diabetic foot ulcer, prostate CA Does not adhere to provider recommendations re: diet and B/P monitoring Pt states that his B/P is always up with he goes to the doctor.  States that when he checks at home it is usually in the 120s/80s.  States he takes his B/P medications as ordered and tries to eat health most of the time.  States he is very active working on houses and cars  Case Electrical engineer):  patient will verbalize understanding of plan for hypertension management patient will attend all scheduled medical appointments: Dr. Cannon Kettle podiatry 05/10/21, Dr. Volanda Napoleon 05/24/21 patient will demonstrate improved adherence to prescribed treatment plan for hypertension as evidenced by taking all medications as prescribed, monitoring and recording blood pressure as directed, adhering to low sodium/DASH diet patient  will demonstrate improved health management independence as evidenced by checking blood pressure as directed and notifying PCP if SBP>160 or DBP > 90, taking all medications as prescribe, and adhering to a low sodium diet as discussed. Interventions:  Collaboration with [name], MD regarding development and update of comprehensive plan of care as evidenced by provider attestation and co-signature Inter-disciplinary care team collaboration (see longitudinal plan of care) Evaluation of current treatment plan related to hypertension self management and patient's adherence to plan as established by provider. Provided education to  patient re: stroke prevention, s/s of heart attack and stroke, DASH diet, complications of uncontrolled blood pressure Reviewed medications with patient and discussed importance of compliance Discussed plans with patient for ongoing care management follow up and provided patient with direct contact information for care management team Advised patient, providing education and rationale, to monitor blood pressure daily and record, calling PCP for findings outside established parameters.  Reviewed scheduled/upcoming provider appointments including: Dr. Cannon Kettle podiatry 05/10/21, Dr. Volanda Napoleon 05/24/21 Reviewed lifestyle modification - low sodium diet, smoking cessation, weight control. medication compliance, exercise and stress Self-Care Activities: - Self administers medications as prescribed Attends all scheduled provider appointments Calls provider office for new concerns, questions, or BP outside discussed parameters Checks BP and records as discussed Follows a low sodium diet/DASH diet Patient Goals: - check blood pressure daily - choose a place to take my blood pressure (home, clinic or office, retail store) - write blood pressure results in a log or diary - ask questions to understand  Follow Up Plan: Telephone follow up appointment with care management team member  scheduled for: 06/07/21 The patient has been provided with contact information for the care management team and has been advised to call with any health related questions or concerns.

## 2021-05-10 ENCOUNTER — Telehealth (INDEPENDENT_AMBULATORY_CARE_PROVIDER_SITE_OTHER): Payer: Medicare HMO | Admitting: Sports Medicine

## 2021-05-10 ENCOUNTER — Other Ambulatory Visit: Payer: Self-pay

## 2021-05-10 ENCOUNTER — Encounter: Payer: Self-pay | Admitting: Sports Medicine

## 2021-05-10 DIAGNOSIS — M79671 Pain in right foot: Secondary | ICD-10-CM

## 2021-05-10 DIAGNOSIS — M86171 Other acute osteomyelitis, right ankle and foot: Secondary | ICD-10-CM

## 2021-05-10 DIAGNOSIS — L97512 Non-pressure chronic ulcer of other part of right foot with fat layer exposed: Secondary | ICD-10-CM

## 2021-05-10 DIAGNOSIS — E11621 Type 2 diabetes mellitus with foot ulcer: Secondary | ICD-10-CM

## 2021-05-10 DIAGNOSIS — E1165 Type 2 diabetes mellitus with hyperglycemia: Secondary | ICD-10-CM

## 2021-05-10 NOTE — Progress Notes (Signed)
Subjective:    Patient ID: [name], male    DOB: [birth year], 70 y.o.   MRN: [id number]  Chief Complaint  Patient presents with   Follow-up   Hypertension    HPI Patient was seen today for f/u on several ongoing issues and refills.  Pt had XRT for prostate 2 months ago.  Needs to schedule f/u with Urology.  Notes some elevated fsbs readings, 244.  Taking insulin 20 units in am and 22 in evening.  notes bp elevated.  Taking losartan 100 mg, HCTZ 12.5 mg, and diltiazem 300 mg.  Had f/u with podiatry, MRI ordered 2/2 concernfor osteomyelitis.  Pt has not scheduled imaging as wound improving and non painful.  Past Medical History:  Diagnosis Date   Diabetes mellitus without complication (HCC)    Insomnia    Prostate cancer ([place])     No Known Allergies  ROS General: Denies fever, chills, night sweats, changes in weight, changes in appetite HEENT: Denies headaches, ear pain, changes in vision, rhinorrhea, sore throat CV: Denies CP, palpitations, SOB, orthopnea Pulm: Denies SOB, cough, wheezing GI: Denies abdominal pain, nausea, vomiting, diarrhea, constipation GU: Denies dysuria, hematuria, frequency Msk: Denies muscle cramps, joint pains Neuro: Denies weakness, numbness, tingling Skin: Denies rashes, bruising  +R great toe wound Psych: Denies depression, anxiety, hallucinations     Objective:    Blood pressure (!) 160/90, pulse 92, temperature 98 F (36.7 C), temperature source Oral, weight 161 lb 3.2 oz (73.1 kg), SpO2 96 %.  Gen. Pleasant, well-nourished, in no distress, normal affect   HEENT: [place]/AT, face symmetric, conjunctiva clear, no scleral icterus, PERRLA, EOMI, nares patent without drainage, pharynx without erythema or exudate. Neck: No JVD, no thyromegaly, no carotid bruits Lungs: no accessory muscle use, CTAB, no wheezes or rales Cardiovascular: RRR, no m/r/g, no peripheral edema Abdomen: BS present, soft, NT/ND, no hepatosplenomegaly. Musculoskeletal: No  deformities, no cyanosis or clubbing, normal tone Neuro:  A&Ox3, CN II-XII intact, normal gait Skin:  Warm, no lesions/ rash.  Calluses of bilateral hands.  Plantar surface of right great toe with healing, open wound without drainage.  No TTP to area.    Wt Readings from Last 3 Encounters:  04/24/21 161 lb 3.2 oz (73.1 kg)  04/17/21 160 lb (72.6 kg)  03/20/21 154 lb 6.4 oz (70 kg)    Lab Results  Component Value Date   WBC 3.6 (L) 03/20/2021   HGB 10.2 (L) 03/20/2021   HCT 28.4 (L) 03/20/2021   PLT 228.0 03/20/2021   GLUCOSE 86 03/20/2021   CHOL 137 09/07/2020   TRIG 56 09/07/2020   HDL 53 09/07/2020   LDLCALC 70 09/07/2020   ALT 15 03/20/2021   AST 17 03/20/2021   NA 142 03/20/2021   K 4.1 03/20/2021   CL 105 03/20/2021   CREATININE 1.34 03/20/2021   BUN 27 (H) 03/20/2021   CO2 31 03/20/2021   TSH 1.01 09/07/2020   PSA 6.49 (H) 03/21/2020   HGBA1C 7.3 (H) 03/20/2021   MICROALBUR 13.6 (H) 11/10/2018    Assessment/Plan:  Essential hypertension -Elevated -We will increase diltiazem from 300 mg to 360 mg daily -Continue losartan 100 mg and HCTZ 12.5 mg daily -Continue lifestyle modifications -Advised to bring record of BP readings to [facility]'s. -Renal artery stenosis ultrasound  - Plan: diltiazem (CARDIZEM LA) 360 MG 24 hr tablet, US Renal Artery Stenosis  Uncontrolled type 2 diabetes mellitus with hyperglycemia (HCC)  -Previous A1c 7.3% on 03/20/2021 -Check fructosamine level -We will increase  Novolin 70/30 from 22 to units to 24 units in a.m. -Continue 20 units in a.m. -Patient encouraged to keep a log of BP readings to bring to clinic -Lifestyle modifications encouraged -Continue statin and ARB -Up-to-date on eye exam and foot exams - Plan: insulin isophane & regular human (NOVOLIN 70/30 FLEXPEN) (70-30) 100 UNIT/ML KwikPen, Fructosamine  Diabetic ulcer of toe of right foot associated with type 2 diabetes mellitus, unspecified ulcer stage ([place]) -Discussed the  importance of evaluating for osteomyelitis despite appearance of improvement in wound -Patient encouraged to schedule MRI and follow-up with podiatry. -Given precautions for continued or worsening symptoms.  Stage 3a chronic kidney disease (HCC) -Creatinine 1.34 on 03/20/2021 -Continue lifestyle modifications -Renally dose medications and avoid nephrotoxic meds. -Ultrasound ordered to evaluate renal artery stenosis  F/u prn in 1-2 months  Grier Mitts, MD

## 2021-05-10 NOTE — Progress Notes (Signed)
Virtual Visit via Telephone Note  I connected with Ura Mckey on 05/10/21 at  5:00 PM EDT by telephone and verified that I am speaking with the correct person using two identifiers.  Location: Patient: [name], home  Provider: Landis Martins, DPM, [place] office     I discussed the limitations, risks, security and privacy concerns of performing an evaluation and management service by telephone and the availability of in person appointments. I also discussed with the patient that there may be a patient responsible charge related to this service. The patient expressed understanding and agreed to proceed.   History of Present Illness: 70 year old male patient met via telemedicine visit to discuss MRI results.  Patient reports that his toe ulcer has healed over and there is no significant redness warmth swelling or drainage from the right great toe.  Patient reports that he is no longer dressing the toe.  Patient denies nausea vomiting constitutional symptoms at this time.   Observations/Objective: Physical exam unable to be performed due to telephone nature of visit  Assessment and Plan: Problem List Items Addressed This Visit       Endocrine   Uncontrolled type 2 diabetes mellitus with hyperglycemia ([place])   Other Visit Diagnoses     Acute osteomyelitis of toe of right foot ([place])    -  Primary   Diabetic ulcer of toe of right foot associated with type 2 diabetes mellitus, with fat layer exposed ([place])       Right foot pain          Discussed MRI results with patient which suggest osteomyelitis of the distal phalanx of the right great toe Patient reports that wound is healed so I advised patient that we will closely monitor this area of osteomyelitis if the wound reoccurs or infection worsens must return to clinic as soon as possible or go to the ER Advised patient to make a follow-up in person visit in 3 to 4 weeks for a toe check or sooner if wound recurs  Follow  Up Instructions: As above   I discussed the assessment and treatment plan with the patient. The patient was provided an opportunity to ask questions and all were answered. The patient agreed with the plan and demonstrated an understanding of the instructions.   The patient was advised to call back or seek an in-person evaluation if the symptoms worsen or if the condition fails to improve as anticipated.  I provided 10 minutes of non-face-to-face time during this encounter.   Landis Martins, DPM

## 2021-05-16 ENCOUNTER — Ambulatory Visit
Admission: RE | Admit: 2021-05-16 | Discharge: 2021-05-16 | Disposition: A | Discharge status: Home / Self Care | Payer: Medicare HMO | Source: Ambulatory Visit | Attending: Family Medicine | Admitting: Family Medicine

## 2021-05-16 ENCOUNTER — Other Ambulatory Visit: Payer: Self-pay

## 2021-05-16 DIAGNOSIS — I10 Essential (primary) hypertension: Secondary | ICD-10-CM | POA: Diagnosis not present

## 2021-05-16 DIAGNOSIS — N3289 Other specified disorders of bladder: Secondary | ICD-10-CM | POA: Diagnosis not present

## 2021-05-24 ENCOUNTER — Ambulatory Visit: Payer: Medicare HMO | Admitting: Family Medicine

## 2021-06-05 ENCOUNTER — Other Ambulatory Visit: Payer: Self-pay

## 2021-06-06 ENCOUNTER — Ambulatory Visit (INDEPENDENT_AMBULATORY_CARE_PROVIDER_SITE_OTHER): Payer: Medicare HMO | Admitting: Family Medicine

## 2021-06-06 ENCOUNTER — Encounter: Payer: Self-pay | Admitting: Family Medicine

## 2021-06-06 VITALS — BP 148/82 | HR 69 | Temp 97.8°F | Wt 159.2 lb

## 2021-06-06 DIAGNOSIS — E1142 Type 2 diabetes mellitus with diabetic polyneuropathy: Secondary | ICD-10-CM | POA: Diagnosis not present

## 2021-06-06 DIAGNOSIS — M869 Osteomyelitis, unspecified: Secondary | ICD-10-CM | POA: Diagnosis not present

## 2021-06-06 DIAGNOSIS — I10 Essential (primary) hypertension: Secondary | ICD-10-CM | POA: Diagnosis not present

## 2021-06-06 DIAGNOSIS — G4721 Circadian rhythm sleep disorder, delayed sleep phase type: Secondary | ICD-10-CM

## 2021-06-06 NOTE — Progress Notes (Signed)
Subjective:    Patient ID: [name], male    DOB: July 19, 1951, 70 y.o.   MRN: [id number]  Chief Complaint  Patient presents with   Follow-up    DM, DM ulcer, BP    HPI Patient was seen today for f/u on HTN, DM, and foot ulcer.  Pt states bp and bs have been good.  Per memory bs 165, 140, 133.  Systolic bp [id number], [id number], and diastolic 63 and 79.   Pt states foot is heeling and without pain.  Had f/u with Podiatry.  Advised osteo present, but will monitor.  Pt states he may go to bed around 1-2am and is up at 2-3 am.  He is able to get up and work without any issues.  Pt may fall asleep on the couch with the tv on.  Pt does snore, but says its not that bad.  In the past melatonin did not help.    Past Medical History:  Diagnosis Date   Diabetes mellitus without complication (HCC)    Insomnia    Prostate cancer ([place])     No Known Allergies  ROS General: Denies fever, chills, night sweats, changes in weight, changes in appetite HEENT: Denies headaches, ear pain, changes in vision, rhinorrhea, sore throat CV: Denies CP, palpitations, SOB, orthopnea Pulm: Denies SOB, cough, wheezing GI: Denies abdominal pain, nausea, vomiting, diarrhea, constipation GU: Denies dysuria, hematuria, frequency Msk: Denies muscle cramps, joint pains Neuro: Denies weakness, numbness, tingling Skin: Denies rashes, bruising Psych: Denies depression, anxiety, hallucinations +insomnia    Objective:    Blood pressure (!) 148/82, pulse 69, temperature 97.8 F (36.6 C), temperature source Oral, weight 159 lb 3.2 oz (72.2 kg), SpO2 98 %.  Gen. Pleasant, well-nourished, in no distress, normal affect   HEENT: [place]/AT, face symmetric, conjunctiva clear, no scleral icterus, PERRLA, EOMI, nares patent without drainage Lungs: no accessory muscle use Cardiovascular: RRR, no m/r/g, no peripheral edema Musculoskeletal: No deformities, no cyanosis or clubbing, normal tone Neuro:  A&Ox3, CN II-XII intact, normal  gait Skin:  Warm, no lesions/ rash.  Calluses on b/l hands   Wt Readings from Last 3 Encounters:  06/06/21 159 lb 3.2 oz (72.2 kg)  04/24/21 161 lb 3.2 oz (73.1 kg)  04/17/21 160 lb (72.6 kg)    Lab Results  Component Value Date   WBC 3.6 (L) 03/20/2021   HGB 10.2 (L) 03/20/2021   HCT 28.4 (L) 03/20/2021   PLT 228.0 03/20/2021   GLUCOSE 86 03/20/2021   CHOL 137 09/07/2020   TRIG 56 09/07/2020   HDL 53 09/07/2020   LDLCALC 70 09/07/2020   ALT 15 03/20/2021   AST 17 03/20/2021   NA 142 03/20/2021   K 4.1 03/20/2021   CL 105 03/20/2021   CREATININE 1.34 03/20/2021   BUN 27 (H) 03/20/2021   CO2 31 03/20/2021   TSH 1.01 09/07/2020   PSA 6.49 (H) 03/21/2020   HGBA1C 7.3 (H) 03/20/2021   MICROALBUR 13.6 (H) 11/10/2018    Assessment/Plan:  Essential hypertension -improving -Recheck BP -Continue lifestyle modifications -Discussed the importance of better BP control -Continue current medications including losartan 100 mg, Cardizem 360 mg, HCTZ 12.5 mg -Renal ultrasound negative for renal artery stenosis on 05/16/2021  Type 2 diabetes mellitus with diabetic polyneuropathy, without long-term current use of insulin (HCC) -Hemoglobin A1c previously 7.3% on 03/20/2021 -Fructosamine level ordered 04/24/2021, however yet to be completed -Discussed the importance of regularly checking blood sugar and bring log to clinic -Continue current medications including  Novolin 70/30 20 units in a.m. and 24 units in p.m.  Osteomyelitis of toe of right foot ([place]) -MRI 05/02/2021 from right toes with soft tissue ulceration at tip of great toe with underlying osteomyelitis of the first distal phalanx.  No abscess. -Managed by podiatry. -Given healing continue to monitor.  For any increased symptoms including pain, drainage, etc. patient to contact podiatry and proceed to nearest ED.  Circadian rhythm sleep disorder, delayed sleep phase type -Previously seen by Dr. Maxwell Caul -Sleep hygiene -Given  handout -Sleep study ordered -Follow-up sleep medicine/pulmonology  F/u in 3-4 months, sooner if needed  Grier Mitts, MD

## 2021-06-07 ENCOUNTER — Telehealth: Payer: Self-pay

## 2021-06-07 ENCOUNTER — Telehealth: Payer: Medicare HMO

## 2021-06-07 NOTE — Telephone Encounter (Signed)
  Care Management   Follow Up Note   06/07/2021 Name: [name] MRN: [id number] DOB: Aug 28, 1951   Referred by: [name], MD Reason for referral : Chronic Care Management (RNCM: Follow up Outreach Chronic Care Management and coordination needs-unsuccessful)   An unsuccessful telephone outreach was attempted today. The patient was referred to the case management team for assistance with care management and care coordination.   Follow Up Plan: The care management team will reach out to the patient again over the next 30 days.   Peter Garter RN, Jackquline Denmark, CDE Care Management Coordinator [facility] [phone], Mobile [phone]

## 2021-06-14 ENCOUNTER — Other Ambulatory Visit: Payer: Self-pay | Admitting: Family Medicine

## 2021-06-14 DIAGNOSIS — E1169 Type 2 diabetes mellitus with other specified complication: Secondary | ICD-10-CM

## 2021-06-18 ENCOUNTER — Telehealth: Payer: Self-pay | Admitting: *Deleted

## 2021-06-18 NOTE — Chronic Care Management (AMB) (Signed)
  Care Management   Note  06/18/2021 Name: Amuel Rousselle MRN: [id number] DOB: November 18, 1950  Marquis Eckerd is a 70 y.o. year old male who is a primary care patient of [name], MD and is actively engaged with the care management team. I reached out to [facility] by phone today to assist with re-scheduling a follow up visit with the RN Case Manager  Follow up plan: Unsuccessful telephone outreach attempt made. A HIPAA compliant phone message was left for the patient providing contact information and requesting a return call.  The care management team will reach out to the patient again over the next 7 days.  If patient returns call to provider office, please advise to call [place] at [phone].  [place] Management  Direct Dial: [phone]

## 2021-06-18 NOTE — Chronic Care Management (AMB) (Signed)
  Care Management   Note  06/18/2021 Name: [name] MRN: [id number] DOB: 03/21/1951  [name] is a 70 y.o. year old male who is a primary care patient of [name], MD and is actively engaged with the care management team. I reached out to [facility] by phone today to assist with re-scheduling a follow up visit with the RN Case Manager  Follow up plan: Telephone appointment with care management team member scheduled for:07/04/21  [place] Management  Direct Dial: [phone]

## 2021-06-22 ENCOUNTER — Other Ambulatory Visit: Payer: Self-pay | Admitting: Family Medicine

## 2021-06-22 DIAGNOSIS — D649 Anemia, unspecified: Secondary | ICD-10-CM

## 2021-06-27 NOTE — Telephone Encounter (Signed)
Rescheduled

## 2021-07-04 ENCOUNTER — Ambulatory Visit (INDEPENDENT_AMBULATORY_CARE_PROVIDER_SITE_OTHER): Payer: Medicare HMO

## 2021-07-04 DIAGNOSIS — E1142 Type 2 diabetes mellitus with diabetic polyneuropathy: Secondary | ICD-10-CM

## 2021-07-04 DIAGNOSIS — I10 Essential (primary) hypertension: Secondary | ICD-10-CM

## 2021-07-04 DIAGNOSIS — Z794 Long term (current) use of insulin: Secondary | ICD-10-CM

## 2021-07-04 DIAGNOSIS — E1169 Type 2 diabetes mellitus with other specified complication: Secondary | ICD-10-CM

## 2021-07-04 NOTE — Chronic Care Management (AMB) (Signed)
Chronic Care Management   CCM RN Visit Note  07/04/2021 Name: [name] MRN: [id number] DOB: Jan 10, 1951  Subjective: [name] is a 70 y.o. year old male who is a primary care patient of [name] Ruddy, MD. The care management team was consulted for assistance with disease management and care coordination needs.    Engaged with patient by telephone for follow up visit in response to provider referral for case management and/or care coordination services.   Consent to Services:  The patient was given information about Chronic Care Management services, agreed to services, and gave verbal consent prior to initiation of services.  Please see initial visit note for detailed documentation.   Patient agreed to services and verbal consent obtained.   Assessment: Review of patient past medical history, allergies, medications, health status, including review of consultants reports, laboratory and other test data, was performed as part of comprehensive evaluation and provision of chronic care management services.   SDOH (Social Determinants of Health) assessments and interventions performed:    CCM Care Plan  No Known Allergies  Outpatient Encounter Medications as of 07/04/2021  Medication Sig   atorvastatin (LIPITOR) 40 MG tablet TAKE 1 TABLET BY MOUTH EVERY DAY   blood glucose meter kit and supplies Dispense based on patient and insurance preference. Use to check glucose daily (FOR ICD-10 E10.9, E11.9).   Continuous Blood Gluc Receiver (DEXCOM G6 RECEIVER) DEVI 1 Device by Does not apply route as directed.   Continuous Blood Gluc Sensor (DEXCOM G6 SENSOR) MISC 1 Device by Does not apply route as directed.   Continuous Blood Gluc Transmit (DEXCOM G6 TRANSMITTER) MISC 1 Device by Does not apply route as directed.   diltiazem (CARDIZEM LA) 360 MG 24 hr tablet Take 1 tablet (360 mg total) by mouth daily.   ferrous gluconate (FERGON) 324 MG tablet TAKE 1 TABLET BY MOUTH DAILY WITH  BREAKFAST   hydrochlorothiazide (HYDRODIURIL) 12.5 MG tablet Take 1 tablet (12.5 mg total) by mouth daily.   insulin isophane & regular human (NOVOLIN 70/30 FLEXPEN) (70-30) 100 UNIT/ML KwikPen Inject 20 Units into the skin daily before breakfast AND 24 Units at bedtime.   Insulin Pen Needle 31G X 8 MM MISC Use as directed for checking blood sugar 3 times daily, more frequently for hyper or hypoglycemia.   Lancet Devices (ONE TOUCH DELICA LANCING DEV) MISC 1 Device by Does not apply route as directed.   latanoprost (XALATAN) 0.005 % ophthalmic solution SMARTSIG:In Eye(s)   losartan (COZAAR) 100 MG tablet TAKE 1 TABLET BY MOUTH EVERY DAY   omeprazole (PRILOSEC) 40 MG capsule TAKE 1 CAPSULE BY MOUTH EVERY DAY   ondansetron (ZOFRAN ODT) 4 MG disintegrating tablet 12m ODT q4 hours prn nausea/vomit   OneTouch Delica Lancets 322WMISC 1 Device by Does not apply route as directed. Use to test up to 4x daily.   ONETOUCH ULTRA test strip USE UP TO 4X DAILY   No facility-administered encounter medications on file as of 07/04/2021.    Patient Active Problem List   Diagnosis Date Noted   Delayed sleep phase syndrome 11/13/2020   Prostate cancer ([facility] 09/16/2020   Severe nonproliferative diabetic retinopathy of right eye with macular edema associated with type 2 diabetes mellitus ([facility] 08/28/2020   Age-related nuclear cataract, bilateral 08/28/2020   Hypertensive retinopathy of both eyes 08/28/2020   Bilateral carotid artery stenosis 08/28/2020   Murmur 08/28/2020   Diabetic macular edema ([facility] 07/13/2020   Mild nonproliferative diabetic retinopathy of right eye with  macular edema associated with type 2 diabetes mellitus ([place]) 07/13/2020   BPH associated with nocturia 02/21/2020   Type 2 diabetes mellitus with diabetic polyneuropathy, without long-term current use of insulin ([place]) 07/06/2019   Dyslipidemia 07/06/2019   ED (erectile dysfunction) 12/24/2018   Osteoarthritis 11/25/2018   Uncontrolled  type 2 diabetes mellitus with hyperglycemia ([place]) 11/12/2018   Hyperlipidemia associated with type 2 diabetes mellitus ([place]) 11/12/2018   Essential hypertension 11/10/2018   Insomnia 11/10/2018   Diabetes ([place]) 01/23/2016   Depression (emotion) 01/22/2016    Conditions to be addressed/monitored:HTN, HLD, and DMII  Care Plan : Diabetes Type 2 (Adult)  Updates made by [name] Ped, RN since 07/04/2021 12:00 AM     Problem: Lack of self management of Type 2 Diabetes   Priority: High     Long-Range Goal: Effective Self Management of Type 2 Diabetes   Start Date: 05/09/2021  Expected End Date: 10/26/2021  This Visit's Progress: On track  Recent Progress: On track  Priority: High  Note:   Objective:  Lab Results  Component Value Date   HGBA1C 7.3 (H) 03/20/2021   Lab Results  Component Value Date   CREATININE 1.34 03/20/2021   CREATININE 2.11 (H) 02/24/2021   CREATININE 1.35 (H) 09/07/2020   No results found for: EGFR Current Barriers:  Knowledge Deficits related to basic Diabetes pathophysiology and self care/management Knowledge Deficits related to medications used for management of diabetes Unable to independently self manage Type 2 diabetes and diabetic foot ulcer Does not adhere to provider recommendations re: diet and frequency of CBG monitoring Pt states that his CBGs range from 86-120 in the AM and the 120's later in the day.  States he does not always check his CBG 4 times a day and will only check if he feels it is too high or low.  Denies any hypoglycemia.  States he is trying to watch what he eat but sometimes likes something sweet.  States he tries to cook at home most of the time. States he is very active working on houses and cars.  States his foot ulcer is healed.  States he is checking is feet daily. Case Manager Clinical Goal(s):  patient will demonstrate improved adherence to prescribed treatment plan for diabetes self care/management as evidenced by: daily  monitoring and recording of CBG  adherence to ADA/ carb modified diet adherence to prescribed medication regimen contacting provider for new or worsened symptoms or questions Interventions:  Collaboration with [name] Ruddy, MD regarding development and update of comprehensive plan of care as evidenced by provider attestation and co-signature Inter-disciplinary care team collaboration (see longitudinal plan of care) Provided education to patient about basic DM disease process Reviewed medications with patient and discussed importance of medication adherence Discussed plans with patient for ongoing care management follow up and provided patient with direct contact information for care management team Provided patient with written educational materials related to hypo and hyperglycemia and importance of correct treatment Reviewed scheduled/upcoming provider appointments including: Dr. Volanda Napoleon 09/06/21 Advised patient, providing education and rationale, to check cbg before meals and at bedtime and record, calling provider for findings outside established parameters.   Review of patient status, including review of consultants reports, relevant laboratory and other test results, and medications completed. Reinforced fasting blood sugar goals of 80-130 and less than 180 1 1/2-2 hours after meals Reinforced importance of daily  foot care and to discuss with podiatry about getting diabetic shoes Self-Care Activities - Self administers oral medications  as prescribed Self administers insulin as prescribed Attends all scheduled provider appointments Checks blood sugars as prescribed and utilize hyper and hypoglycemia protocol as needed Adheres to prescribed ADA/carb modified Patient Goals: - check blood sugar at prescribed times - check blood sugar before and after exercise - check blood sugar if I feel it is too high or too low - enter blood sugar readings and medication or insulin into daily log -  take the blood sugar log to all doctor visits - take the blood sugar meter to all doctor visits - change to whole grain breads, cereal, pasta - drink 6 to 8 glasses of water each day - fill half of plate with vegetables - limit fast food meals to no more than 1 per week - manage portion size - prepare main meal at home 3 to 5 days each week - read food labels for fat, fiber, carbohydrates and portion size - switch to low-fat or skim milk - switch to sugar-free drinks - schedule appointment with eye doctor - check feet daily for cuts, sores or redness - keep feet up while sitting - trim toenails straight across - wash and dry feet carefully every day - wear comfortable, cotton socks - wear comfortable, well-fitting shoes Follow Up Plan: Telephone follow up appointment with care management team member scheduled for: 10/03/21 at 1 PM The patient has been provided with contact information for the care management team and has been advised to call with any health related questions or concerns.      Care Plan : Hypertension (Adult)  Updates made by [name] Ped, RN since 07/04/2021 12:00 AM     Problem: Hypertension   Priority: Medium     Long-Range Goal: Effective self management of Hypertension   Start Date: 05/09/2021  Expected End Date: 09/26/2021  This Visit's Progress: On track  Recent Progress: On track  Priority: Medium  Note:   Objective:  Last practice recorded BP readings:  BP Readings from Last 3 Encounters:  04/24/21 (!) 160/90  04/17/21 (!) 155/82  03/20/21 (!) 180/80   Most recent eGFR/CrCl: No results found for: EGFR  No components found for: CRCL Current Barriers:  Knowledge Deficits related to basic understanding of hypertension pathophysiology and self care management Unable to independently self manage hypertension with hx of HLD, DM2, diabetic foot ulcer, prostate CA Does not adhere to provider recommendations re: diet and B/P monitoring   States that   he is checking his B/P at home it is usually in the 120s/80s.  States he takes his B/P medications as ordered and tries to eat health most of the time.  States he does not use salt in his food. States he is very active working on houses and cars  Case Electrical engineer):  patient will verbalize understanding of plan for hypertension management patient will attend all scheduled medical appointments: Dr. Volanda Napoleon 09/06/21 patient will demonstrate improved adherence to prescribed treatment plan for hypertension as evidenced by taking all medications as prescribed, monitoring and recording blood pressure as directed, adhering to low sodium/DASH diet patient will demonstrate improved health management independence as evidenced by checking blood pressure as directed and notifying PCP if SBP>160 or DBP > 90, taking all medications as prescribe, and adhering to a low sodium diet as discussed. Interventions:  Collaboration with [name] Ruddy, MD regarding development and update of comprehensive plan of care as evidenced by provider attestation and co-signature Inter-disciplinary care team collaboration (see longitudinal plan of care) Evaluation of  current treatment plan related to hypertension self management and patient's adherence to plan as established by provider. Reinforced  stroke prevention, s/s of heart attack and stroke, DASH diet, complications of uncontrolled blood pressure Reviewed medications with patient and discussed importance of compliance Discussed plans with patient for ongoing care management follow up and provided patient with direct contact information for care management team Reinforced to monitor blood pressure daily and record, calling PCP for findings outside established parameters.  Reviewed scheduled/upcoming provider appointments including: Dr. Volanda Napoleon 09/06/21 Reviewed lifestyle modification - low sodium diet, smoking cessation, weight control. medication compliance, exercise  and stress Self-Care Activities: - Self administers medications as prescribed Attends all scheduled provider appointments Calls provider office for new concerns, questions, or BP outside discussed parameters Checks BP and records as discussed Follows a low sodium diet/DASH diet Patient Goals: - check blood pressure daily - choose a place to take my blood pressure (home, clinic or office, retail store) - write blood pressure results in a log or diary - ask questions to understand  Follow Up Plan: Telephone follow up appointment with care management team member scheduled for: 10/03/21 at 1 PM The patient has been provided with contact information for the care management team and has been advised to call with any health related questions or concerns.       Plan:Telephone follow up appointment with care management team member scheduled for:  10/03/21 and The patient has been provided with contact information for the care management team and has been advised to call with any health related questions or concerns.  Peter Garter RN, [facility] [place], CDE Care Management Coordinator [facility] [phone], Mobile [phone]

## 2021-07-04 NOTE — Patient Instructions (Signed)
Visit Information  PATIENT GOALS:  Goals Addressed             This Visit's Progress    RNCM:Monitor and Manage My Blood Sugar-Diabetes Type 2   On track    Timeframe:  Long-Range Goal Priority:  High Start Date:       05/09/21                      Expected End Date:   10/26/21                    Follow Up Date 10/03/21    - check blood sugar at prescribed times - check blood sugar before and after exercise - check blood sugar if I feel it is too high or too low - enter blood sugar readings and medication or insulin into daily log - take the blood sugar log to all doctor visits - take the blood sugar meter to all doctor visits    Why is this important?   Checking your blood sugar at home helps to keep it from getting very high or very low.  Writing the results in a diary or log helps the doctor know how to care for you.  Your blood sugar log should have the time, date and the results.  Also, write down the amount of insulin or other medicine that you take.  Other information, like what you ate, exercise done and how you were feeling, will also be helpful.     Notes:      RNCM:Perform Foot Care-Diabetes Type 2   On track    Timeframe:  Long-Range Goal Priority:  High Start Date:         05/09/21                    Expected End Date:     10/26/21                  Follow Up Date 10/03/21    - check feet daily for cuts, sores or redness - keep feet up while sitting - trim toenails straight across - wash and dry feet carefully every day - wear comfortable, cotton socks - wear comfortable, well-fitting shoes -keep appointments with podiatrist    Why is this important?   Good foot care is very important when you have diabetes.  There are many things you can do to keep your feet healthy and catch a problem early.    Notes:      RNCM:Track and Manage My Blood Pressure-Hypertension   On track    Timeframe:  Long-Range Goal Priority:  Medium Start Date:     05/09/21                         Expected End Date:    10/26/21                   Follow Up Date 10/03/21    - check blood pressure daily - choose a place to take my blood pressure (home, clinic or office, retail store) - write blood pressure results in a log or diary    Why is this important?   You won't feel high blood pressure, but it can still hurt your blood vessels.  High blood pressure can cause heart or kidney problems. It can also cause a stroke.  Making lifestyle changes like losing a little weight  or eating less salt will help.  Checking your blood pressure at home and at different times of the day can help to control blood pressure.  If the doctor prescribes medicine remember to take it the way the doctor ordered.  Call the office if you cannot afford the medicine or if there are questions about it.     Notes:         Patient verbalizes understanding of instructions provided today and agrees to view in [place].   Telephone follow up appointment with care management team member scheduled for:  Peter Garter RN, [facility], CDE Care Management Coordinator [place] [phone], Mobile [phone]

## 2021-07-26 DIAGNOSIS — Z794 Long term (current) use of insulin: Secondary | ICD-10-CM | POA: Diagnosis not present

## 2021-07-26 DIAGNOSIS — I10 Essential (primary) hypertension: Secondary | ICD-10-CM

## 2021-07-26 DIAGNOSIS — E1142 Type 2 diabetes mellitus with diabetic polyneuropathy: Secondary | ICD-10-CM

## 2021-07-26 DIAGNOSIS — E1169 Type 2 diabetes mellitus with other specified complication: Secondary | ICD-10-CM | POA: Diagnosis not present

## 2021-07-26 DIAGNOSIS — E785 Hyperlipidemia, unspecified: Secondary | ICD-10-CM | POA: Diagnosis not present

## 2021-08-08 ENCOUNTER — Encounter: Payer: Self-pay | Admitting: Family Medicine

## 2021-08-08 ENCOUNTER — Ambulatory Visit (INDEPENDENT_AMBULATORY_CARE_PROVIDER_SITE_OTHER): Payer: Medicare HMO | Admitting: Podiatrist

## 2021-08-08 ENCOUNTER — Other Ambulatory Visit: Payer: Self-pay

## 2021-08-08 ENCOUNTER — Encounter: Payer: Self-pay | Admitting: Podiatrist

## 2021-08-08 ENCOUNTER — Ambulatory Visit (INDEPENDENT_AMBULATORY_CARE_PROVIDER_SITE_OTHER): Payer: Medicare HMO | Admitting: Family Medicine

## 2021-08-08 ENCOUNTER — Ambulatory Visit (INDEPENDENT_AMBULATORY_CARE_PROVIDER_SITE_OTHER): Payer: Medicare HMO

## 2021-08-08 VITALS — BP 150/70 | HR 79 | Temp 97.8°F | Wt 157.2 lb

## 2021-08-08 DIAGNOSIS — R21 Rash and other nonspecific skin eruption: Secondary | ICD-10-CM | POA: Diagnosis not present

## 2021-08-08 DIAGNOSIS — L97521 Non-pressure chronic ulcer of other part of left foot limited to breakdown of skin: Secondary | ICD-10-CM | POA: Diagnosis not present

## 2021-08-08 DIAGNOSIS — L97519 Non-pressure chronic ulcer of other part of right foot with unspecified severity: Secondary | ICD-10-CM

## 2021-08-08 DIAGNOSIS — E11621 Type 2 diabetes mellitus with foot ulcer: Secondary | ICD-10-CM | POA: Diagnosis not present

## 2021-08-08 DIAGNOSIS — I10 Essential (primary) hypertension: Secondary | ICD-10-CM

## 2021-08-08 DIAGNOSIS — T148XXA Other injury of unspecified body region, initial encounter: Secondary | ICD-10-CM

## 2021-08-08 DIAGNOSIS — L03116 Cellulitis of left lower limb: Secondary | ICD-10-CM

## 2021-08-08 DIAGNOSIS — I872 Venous insufficiency (chronic) (peripheral): Secondary | ICD-10-CM | POA: Diagnosis not present

## 2021-08-08 MED ORDER — SULFAMETHOXAZOLE-TRIMETHOPRIM 800-160 MG PO TABS: 1.0000 | ORAL_TABLET | Freq: Two times a day (BID) | ORAL | 0 refills | Status: AC

## 2021-08-08 MED ORDER — CEPHALEXIN 500 MG PO CAPS: 500.0000 mg | ORAL_CAPSULE | Freq: Three times a day (TID) | ORAL | 0 refills | Status: DC

## 2021-08-08 NOTE — Progress Notes (Signed)
Subjective:    Patient ID: [name], male    DOB: November 28, 1950, 70 y.o.   MRN: [id number]  Chief Complaint  Patient presents with   blisters    Blisters on feet, left foot is swollen and has blisters, rigth big toe is bleeding, x 1 week    HPI Patient was seen today for acute concern.  Patient endorses right great toe blister developed on Saturday, then began bleeding.  Patient previously seen by podiatry for similar lesion on right great toe due to osteomyelitis. Toes of L foot became edematous, draining clear fluid around the same time.  Pt denies pain, fever, chills, n/v.  States has not been wearing shoes that rub.    States bp at home is 100s/"a low number".  Does not have bp log or meter with him.  Not eating pork.  Or adding salt to food.  At the end of visit pt mentions he still cannot sleep.  Past Medical History:  Diagnosis Date   Diabetes mellitus without complication (HCC)    Insomnia    Prostate cancer ([place])     No Known Allergies  ROS General: Denies fever, chills, night sweats, changes in weight, changes in appetite HEENT: Denies headaches, ear pain, changes in vision, rhinorrhea, sore throat CV: Denies CP, palpitations, SOB, orthopnea Pulm: Denies SOB, cough, wheezing GI: Denies abdominal pain, nausea, vomiting, diarrhea, constipation GU: Denies dysuria, hematuria, frequency Msk: Denies muscle cramps, joint pains Neuro: Denies weakness, numbness, tingling Skin: Denies rashes, bruising  + blisters and edema of toes on left foot, sore on right great toe Psych: Denies depression, anxiety, hallucinations + insomnia     Objective:    Blood pressure (!) 150/70, pulse 79, temperature 97.8 F (36.6 C), temperature source Oral, weight 157 lb 3.2 oz (71.3 kg), SpO2 94 %.  Gen. Pleasant, well-nourished, in no distress, normal affect   HEENT: [place]/AT, face symmetric, conjunctiva clear, no scleral icterus, PERRLA, EOMI, nares patent without drainage Lungs: no  accessory muscle use Cardiovascular: RRR, trace edema in bilateral ankles.  PT pulses 2+ bilaterally Musculoskeletal: No deformities, no cyanosis or clubbing, normal tone Neuro:  A&Ox3, CN II-XII intact, normal gait Skin:  Warm, dry.  Calluses on dorsum of bilateral hands PIP joints.  Plantar surface of right great toe with open ulceration without drainage.  Left foot with area of erythema from toes to midfoot.  2nd-5th toes of left foot with blisters.  Blister over fourth left toe draining straw-colored serous fluid.  Mildly increased warmth of left midfoot.  Mildly malodorous.       Wt Readings from Last 3 Encounters:  08/08/21 157 lb 3.2 oz (71.3 kg)  06/06/21 159 lb 3.2 oz (72.2 kg)  04/24/21 161 lb 3.2 oz (73.1 kg)    Lab Results  Component Value Date   WBC 3.6 (L) 03/20/2021   HGB 10.2 (L) 03/20/2021   HCT 28.4 (L) 03/20/2021   PLT 228.0 03/20/2021   GLUCOSE 86 03/20/2021   CHOL 137 09/07/2020   TRIG 56 09/07/2020   HDL 53 09/07/2020   LDLCALC 70 09/07/2020   ALT 15 03/20/2021   AST 17 03/20/2021   NA 142 03/20/2021   K 4.1 03/20/2021   CL 105 03/20/2021   CREATININE 1.34 03/20/2021   BUN 27 (H) 03/20/2021   CO2 31 03/20/2021   TSH 1.01 09/07/2020   PSA 6.49 (H) 03/21/2020   HGBA1C 7.3 (H) 03/20/2021   MICROALBUR 13.6 (H) 11/10/2018    Assessment/Plan:  Cellulitis  of left foot  -We will obtain labs -Appointment made for podiatry today at 10:15 AM -We will obtain labs to assess renal function -Patient advised to hold off on picking up Rx for Bactrim and antibiotic may need to be adjusted based on renal function and podiatry's plan. - Plan: CBC with Differential/Platelet, Hemoglobin K1P, Basic metabolic panel, sulfamethoxazole-trimethoprim (BACTRIM DS) 800-160 MG tablet  Diabetic ulcer of toe of right foot associated with type 2 diabetes mellitus, unspecified ulcer stage ([place]) -Concern for recurrent osteomyelitis in right great toe -Appointment made with  podiatry today 10/13 at 10:15 AM. -We will obtain labs to assess renal function.   -Patient advised to hold off on picking up Rx for Bactrim until after visit with podiatry as prescription may need to be adjusted. -Hemoglobin A1c 7.3% 03/20/21. -Discussed the importance of good glycemic control. - Plan: CBC with Differential/Platelet, Hemoglobin A1c, sulfamethoxazole-trimethoprim (BACTRIM DS) 800-160 MG tablet  Essential hypertension -Uncontrolled in office -Continue losartan 100 mg daily -discussed need for additional medication.  Patient wishes to wait at this time -Advised to bring log of BP and BP cuff to next [facility]. -Continue lifestyle modifications  F/u prn  Grier Mitts, MD

## 2021-08-08 NOTE — Patient Instructions (Signed)
Get some Iodine from the drug store-  every day get some gauze pads and lightly wet them with iodine and wrap them around the toes on the left foot.  And cover  On the right foot I will order the iodorb paste and it will be mailed to you.  Apply to the ulcer of the toe daily and cover  Continue to wear the surgical shoes until we can get these areas healed up  I am calling in an antibiotic to your pharmacy to start taking today.

## 2021-08-08 NOTE — Progress Notes (Signed)
Chief Complaint  Patient presents with   Blister    Lt 2nd-5th toes blisters, swollen toes, and fluid filled x Saturday - no injury/pain -no popped blisters - w/ draiange at top of too tx: sx shoes    Ulcer    Open lesion at Rt hallux (top) x Saturday -pt states started as a blister than it popped and left an open lesion - w/ bloody fluid - no warmth/odor -w/ redness and with less swelling after blister popped tx: sx shoes    Diabetes    FBS: 135 a1C: 7 CPP: [place] x today      HPI: Patient is 70 y.o. male who presents today for large blisters that have formed on his left foot around toes 2,3,4 and 5.  He relates no trauma or injury to the feet. States he noticed the blisters on Saturday. Relates the skin around the toes became fluid filled.  He also has an ulcer on the plantar right hallux that is improved but not healed.  Relates he is using santyl on this area.  Denies any systemic signs of infection   Had a prior MRI on the right foot for the ulcer right great toe.  MRI was concerning for osteomyelitis.  At this time patient relates ulcer has improved.    Patient Active Problem List   Diagnosis Date Noted   Delayed sleep phase syndrome 11/13/2020   Prostate cancer ([place]) 09/16/2020   Severe nonproliferative diabetic retinopathy of right eye with macular edema associated with type 2 diabetes mellitus ([place]) 08/28/2020   Age-related nuclear cataract, bilateral 08/28/2020   Hypertensive retinopathy of both eyes 08/28/2020   Bilateral carotid artery stenosis 08/28/2020   Murmur 08/28/2020   Diabetic macular edema ([place]) 07/13/2020   Mild nonproliferative diabetic retinopathy of right eye with macular edema associated with type 2 diabetes mellitus ([place]) 07/13/2020   BPH associated with nocturia 02/21/2020   Type 2 diabetes mellitus with diabetic polyneuropathy, without long-term current use of insulin ([place]) 07/06/2019   Dyslipidemia 07/06/2019   ED (erectile dysfunction) 12/24/2018    Osteoarthritis 11/25/2018   Uncontrolled type 2 diabetes mellitus with hyperglycemia ([place]) 11/12/2018   Hyperlipidemia associated with type 2 diabetes mellitus ([place]) 11/12/2018   Essential hypertension 11/10/2018   Insomnia 11/10/2018   Diabetes ([place]) 01/23/2016   Depression (emotion) 01/22/2016    Current Outpatient Medications on File Prior to Visit  Medication Sig Dispense Refill   atorvastatin (LIPITOR) 40 MG tablet TAKE 1 TABLET BY MOUTH EVERY DAY 90 tablet 1   blood glucose meter kit and supplies Dispense based on patient and insurance preference. Use to check glucose daily (FOR ICD-10 E10.9, E11.9). 1 each 0   Continuous Blood Gluc Receiver (DEXCOM G6 RECEIVER) DEVI 1 Device by Does not apply route as directed. 1 each 0   Continuous Blood Gluc Sensor (DEXCOM G6 SENSOR) MISC 1 Device by Does not apply route as directed. 9 each 3   Continuous Blood Gluc Transmit (DEXCOM G6 TRANSMITTER) MISC 1 Device by Does not apply route as directed. 1 each 3   diltiazem (CARDIZEM LA) 360 MG 24 hr tablet Take 1 tablet (360 mg total) by mouth daily. 30 tablet 3   ferrous gluconate (FERGON) 324 MG tablet TAKE 1 TABLET BY MOUTH DAILY WITH BREAKFAST 90 tablet 1   hydrochlorothiazide (HYDRODIURIL) 12.5 MG tablet Take 1 tablet (12.5 mg total) by mouth daily. 30 tablet 3   insulin isophane & regular human (NOVOLIN 70/30 FLEXPEN) (70-30) 100 UNIT/ML KwikPen Inject  20 Units into the skin daily before breakfast AND 24 Units at bedtime. 15 mL 11   Insulin Pen Needle 31G X 8 MM MISC Use as directed for checking blood sugar 3 times daily, more frequently for hyper or hypoglycemia. 100 each 3   Lancet Devices (ONE TOUCH DELICA LANCING DEV) MISC 1 Device by Does not apply route as directed. 1 each 0   latanoprost (XALATAN) 0.005 % ophthalmic solution SMARTSIG:In Eye(s)     losartan (COZAAR) 100 MG tablet TAKE 1 TABLET BY MOUTH EVERY DAY 90 tablet 1   omeprazole (PRILOSEC) 40 MG capsule TAKE 1 CAPSULE BY MOUTH EVERY  DAY 30 capsule 1   ondansetron (ZOFRAN ODT) 4 MG disintegrating tablet 71m ODT q4 hours prn nausea/vomit 20 tablet 0   OneTouch Delica Lancets 370BMISC 1 Device by Does not apply route as directed. Use to test up to 4x daily. 400 each 1   ONETOUCH ULTRA test strip USE UP TO 4X DAILY 300 strip 2   sulfamethoxazole-trimethoprim (BACTRIM DS) 800-160 MG tablet Take 1 tablet by mouth 2 (two) times daily for 10 days. 20 tablet 0   No current facility-administered medications on file prior to visit.    No Known Allergies  Review of Systems No fevers, chills, nausea, muscle aches, no difficulty breathing, no calf pain, no chest pain or shortness of breath.   Physical Exam  GENERAL APPEARANCE: Alert, conversant. Appropriately groomed. No acute distress.   VASCULAR: Pedal pulses palpable DP and PT bilateral.  Capillary refill time is immediate to all digits,  Proximal to distal cooling it warm to warm.  Digital perfusion adequate.   NEUROLOGIC: sensation is decreased to epicritically and protectively bilateral as per baseline.   MUSCULOSKELETAL: acceptable muscle strength, tone and stability bilateral.  No gross boney pedal deformities noted.  No pain, crepitus or limitation noted with foot and ankle range of motion bilateral.   DERMATOLOGIC: skin is warm, supple, and dry. Blisters present on the left toes 2- surrounding the entire toe, 3,4,5 dorsally.  Blisters have a clear fluid expressed when drained.  Mild rubor and warmth of the distal foot noted.    Ulcer distal plantar aspect of right great toe.  Measures 2 cm x 0.7 cm x 0/3 cm with a fibrogranular base and keratotic rim. No pus or pirulence noted.  Bloody drainage noted today.. no redness or swelling seen. -  MRI to suggest osteomyelitis.      Assessment   1. Ulcer of great toe, left, limited to breakdown of skin ([place]   2. Acute blistering eruption of skin      Plan  Discussed exam findings with the patient.  I lanced the  blisters and kept the protective skin intact.  I applied iodine soaked gauze around the toes to help dry out the tissue.  I instructed the patient on how to do this at home.   I also pared the necrotic tissue from the ulcer of the right great toe. I recommended iodosorb and will order this for him through [facility].  Rx for keflex called in for him to start taking today.   He will be seen back in 1 week for follow up=  if he feels his feet are getting worse and not improving, he is to call or go to the ER to be seen.

## 2021-08-09 ENCOUNTER — Other Ambulatory Visit: Payer: Self-pay | Admitting: Podiatrist

## 2021-08-09 DIAGNOSIS — R21 Rash and other nonspecific skin eruption: Secondary | ICD-10-CM

## 2021-08-09 DIAGNOSIS — L97521 Non-pressure chronic ulcer of other part of left foot limited to breakdown of skin: Secondary | ICD-10-CM

## 2021-08-15 ENCOUNTER — Ambulatory Visit (INDEPENDENT_AMBULATORY_CARE_PROVIDER_SITE_OTHER): Payer: Medicare HMO | Admitting: Podiatrist

## 2021-08-15 ENCOUNTER — Encounter: Payer: Self-pay | Admitting: Podiatrist

## 2021-08-15 ENCOUNTER — Other Ambulatory Visit: Payer: Self-pay

## 2021-08-15 DIAGNOSIS — L97511 Non-pressure chronic ulcer of other part of right foot limited to breakdown of skin: Secondary | ICD-10-CM | POA: Diagnosis not present

## 2021-08-15 DIAGNOSIS — L97521 Non-pressure chronic ulcer of other part of left foot limited to breakdown of skin: Secondary | ICD-10-CM | POA: Diagnosis not present

## 2021-08-15 NOTE — Progress Notes (Signed)
Chief Complaint  Patient presents with   Blister    F/L Lt 2nd-5th blisters -pt states," they're gone, toes going back to normal." - no pain - w/ less swelling no draiange - w/ redness -lt 2nd tooks inflammed tx: iodine    Wound Check    F/U Rt hallux wound check -pt states,:" wound looks better." - w/ bleeding and less swelling - no redness tx: iodosorb dressing      HPI: Patient is 70 y.o. male who presents today for follow up of blisters of the left foot and to check the ulcer on the right great toe.  He relates he received the iodosorb from [facility] and has been using the medicine as instructed.     No Known Allergies  Review of systems is negative except as noted in the HPI.  Denies nausea/ vomiting/ fevers/ chills or night sweats.   Denies difficulty breathing, denies calf pain or tenderness  Physical Exam  Patient is awake, alert, and oriented x 3.  In no acute distress.    Vascular status is intact with palpable pedal pulses DP and PT bilateral and capillary refill time less than 3 seconds bilateral.  No edema or erythema noted.   Neurological exam reveals decreased sensation both epicritic and protectively bilateral as per baseline  Dermatological exam reveals skin is supple and dry to bilateral feet.  Blisters that were present on the left foot toes 2, 3, 4 and 5 have completely resolved at today's visit.  New skin is present.  There is an ulcer on the distal aspect of the left second toe likely f resulting from the blistering it has a red granular base and measures 1.8 cm x 1.2 cm x 0.2 cm in depth..  Ulceration of the right great toe continues to be present and stable with a red granular base present..  It measures 1.4 cm x 0.7 cm x 0.2 cm in depth.        Musculoskeletal exam: Musculature intact with dorsiflexion, plantarflexion, inversion, eversion. Ankle and First MPJ joint range of motion normal.     Assessment: 1. Skin ulcer of right great toe, limited to breakdown  of skin ([place])   2. Ulcer of left second toe, limited to breakdown of skin ([place])    Resolved blister eruption left foot toes 2,3,4,5.   Plan: Discussed exam findings with the patient and how he is significantly improved from the blister eruption.  He discussed he treated this with the iodine dressings as instructed.  He also did receive the Iodosorb for the right great toe ulceration and states that he has been using this daily as well. -I recommended he use Iodosorb on the distal aspect of the left second toe as well as the distal aspect of the right great toe into cover.   -He may discontinue the use of the iodine wet-to-dry dressings on the left foot. -He will continue to wear the surgical shoes and will be seen back in 2 weeks for ulcer recheck.

## 2021-08-29 ENCOUNTER — Other Ambulatory Visit: Payer: Self-pay

## 2021-08-29 ENCOUNTER — Ambulatory Visit (INDEPENDENT_AMBULATORY_CARE_PROVIDER_SITE_OTHER): Payer: Medicare HMO | Admitting: Sports Medicine

## 2021-08-29 DIAGNOSIS — E1165 Type 2 diabetes mellitus with hyperglycemia: Secondary | ICD-10-CM

## 2021-08-29 DIAGNOSIS — L97511 Non-pressure chronic ulcer of other part of right foot limited to breakdown of skin: Secondary | ICD-10-CM | POA: Diagnosis not present

## 2021-08-29 DIAGNOSIS — R21 Rash and other nonspecific skin eruption: Secondary | ICD-10-CM

## 2021-08-29 DIAGNOSIS — L97521 Non-pressure chronic ulcer of other part of left foot limited to breakdown of skin: Secondary | ICD-10-CM

## 2021-08-29 NOTE — Progress Notes (Signed)
Subjective: Maze Corniel is a 70 y.o. male patient seen in office for follow-up evaluation of ulcerations to the bilateral second toes and to right great toe.  Patient reports that he has been using Iodosorb and thinks that it is getting better.  Denies any constitutional symptoms at this time.  Patient reports that he is taking his antibiotics thinks he has a few pills left.  No other pedal complaints noted. Patient Active Problem List   Diagnosis Date Noted   Delayed sleep phase syndrome 11/13/2020   Prostate cancer ([place]) 09/16/2020   Severe nonproliferative diabetic retinopathy of right eye with macular edema associated with type 2 diabetes mellitus ([place]) 08/28/2020   Age-related nuclear cataract, bilateral 08/28/2020   Hypertensive retinopathy of both eyes 08/28/2020   Bilateral carotid artery stenosis 08/28/2020   Murmur 08/28/2020   Diabetic macular edema ([place]) 07/13/2020   Mild nonproliferative diabetic retinopathy of right eye with macular edema associated with type 2 diabetes mellitus ([place]) 07/13/2020   BPH associated with nocturia 02/21/2020   Type 2 diabetes mellitus with diabetic polyneuropathy, without long-term current use of insulin ([place]) 07/06/2019   Dyslipidemia 07/06/2019   ED (erectile dysfunction) 12/24/2018   Osteoarthritis 11/25/2018   Uncontrolled type 2 diabetes mellitus with hyperglycemia ([place]) 11/12/2018   Hyperlipidemia associated with type 2 diabetes mellitus ([place]) 11/12/2018   Essential hypertension 11/10/2018   Insomnia 11/10/2018   Diabetes ([place]) 01/23/2016   Depression (emotion) 01/22/2016   Current Outpatient Medications on File Prior to Visit  Medication Sig Dispense Refill   atorvastatin (LIPITOR) 40 MG tablet TAKE 1 TABLET BY MOUTH EVERY DAY 90 tablet 1   blood glucose meter kit and supplies Dispense based on patient and insurance preference. Use to check glucose daily (FOR ICD-10 E10.9, E11.9). 1 each 0   cephALEXin (KEFLEX) 500 MG capsule Take 1  capsule (500 mg total) by mouth 3 (three) times daily. 30 capsule 0   Continuous Blood Gluc Receiver (DEXCOM G6 RECEIVER) DEVI 1 Device by Does not apply route as directed. 1 each 0   Continuous Blood Gluc Sensor (DEXCOM G6 SENSOR) MISC 1 Device by Does not apply route as directed. 9 each 3   Continuous Blood Gluc Transmit (DEXCOM G6 TRANSMITTER) MISC 1 Device by Does not apply route as directed. 1 each 3   diltiazem (CARDIZEM LA) 360 MG 24 hr tablet Take 1 tablet (360 mg total) by mouth daily. 30 tablet 3   ferrous gluconate (FERGON) 324 MG tablet TAKE 1 TABLET BY MOUTH DAILY WITH BREAKFAST 90 tablet 1   hydrochlorothiazide (HYDRODIURIL) 12.5 MG tablet Take 1 tablet (12.5 mg total) by mouth daily. 30 tablet 3   insulin isophane & regular human (NOVOLIN 70/30 FLEXPEN) (70-30) 100 UNIT/ML KwikPen Inject 20 Units into the skin daily before breakfast AND 24 Units at bedtime. 15 mL 11   Insulin Pen Needle 31G X 8 MM MISC Use as directed for checking blood sugar 3 times daily, more frequently for hyper or hypoglycemia. 100 each 3   Lancet Devices (ONE TOUCH DELICA LANCING DEV) MISC 1 Device by Does not apply route as directed. 1 each 0   latanoprost (XALATAN) 0.005 % ophthalmic solution SMARTSIG:In Eye(s)     losartan (COZAAR) 100 MG tablet TAKE 1 TABLET BY MOUTH EVERY DAY 90 tablet 1   omeprazole (PRILOSEC) 40 MG capsule TAKE 1 CAPSULE BY MOUTH EVERY DAY 30 capsule 1   ondansetron (ZOFRAN ODT) 4 MG disintegrating tablet 47m ODT q4 hours prn nausea/vomit 20 tablet  0   OneTouch Delica Lancets 97C MISC 1 Device by Does not apply route as directed. Use to test up to 4x daily. 400 each 1   ONETOUCH ULTRA test strip USE UP TO 4X DAILY 300 strip 2   No current facility-administered medications on file prior to visit.   No Known Allergies  No results found for this or any previous visit (from the past 2160 hour(s)).  Objective: There were no vitals filed for this visit.  General: Patient is awake,  alert, oriented x 3 and in no acute distress.  Dermatology: Skin is warm and dry bilateral with a partial thickness ulceration present:  1.Left second toe. Ulceration measures 0.3 cm x 0.2 cm x 0.1cm. There is a  Keratotic border with a granular base. The ulceration does not  probe to bone. There is no malodor, no active drainage, no erythema, no edema.  2.  Right great toe ulceration that measures 1 cm x 1 cm x 0.1 cm with a keratotic border and a granular base the ulceration does not probe to bone there is no malodor minimal bloody active drainage localized erythema no significant edema and no warmth.  3.  Clear fluid blister noted to the right second toe distal tuft medial aspect with no significant active drainage, no redness no warmth no malodor or acute signs of infection.   Vascular: Dorsalis Pedis pulse = 1/4 Bilateral,  Posterior Tibial pulse = 1/4 Bilateral,  Capillary Fill Time < 5 seconds  Neurologic: Protective absent bilateral.  Musculosketal: There is no pain to palpation to the ulcerated areas bilateral.  No results for input(s): GRAMSTAIN, LABORGA in the last 8760 hours.  Assessment and Plan:  Problem List Items Addressed This Visit       Endocrine   Uncontrolled type 2 diabetes mellitus with hyperglycemia ([place])   Other Visit Diagnoses     Skin ulcer of right great toe, limited to breakdown of skin ([place])    -  Primary   Ulcer of left second toe, limited to breakdown of skin (H[facility])       Ulcer of great toe, left, limited to breakdown of skin ([place])       Acute blistering eruption of skin            -Examined patient and discussed the progression of the wound and treatment alternatives. -Photographs of today's wound uploaded to chart -Excisionally dedbrided ulceration at left second right hallux to healthy bleeding borders removing nonviable tissue using a sterile chisel blade. Wound measures post debridement as above.  Wound was debrided to the level of the dermis  with viable wound base exposed to promote healing. Hemostasis was achieved with manuel pressure. Patient tolerated procedure well without any discomfort or anesthesia necessary for this wound debridement.  -Using a sterile 15 blade to lance the blister at the right second toe there was no active drainage. -Applied Iodosorb and dry sterile dressing to all wounds and blister and instructed patient to continue with daily dressings at home consisting of same daily -Advised patient to avoid getting wounds wet to cover when he is in a shower -Advised patient to continue with his oral antibiotics until he has completed them - Advised patient to go to the ER or return to office if the wound worsens or if constitutional symptoms are present. -Advised patient to use surgical shoes or shoes that do not rub his toes he comes in office today wearing normal casual sneakers -Patient to return to office  in 2 weeks for follow up care and evaluation or sooner if problems arise.  Landis Martins, DPM

## 2021-09-06 ENCOUNTER — Other Ambulatory Visit: Payer: Self-pay

## 2021-09-06 ENCOUNTER — Ambulatory Visit (INDEPENDENT_AMBULATORY_CARE_PROVIDER_SITE_OTHER): Payer: Medicare HMO | Admitting: Family Medicine

## 2021-09-06 ENCOUNTER — Encounter: Payer: Self-pay | Admitting: Family Medicine

## 2021-09-06 VITALS — BP 138/84 | HR 76 | Temp 98.2°F | Wt 166.2 lb

## 2021-09-06 DIAGNOSIS — I10 Essential (primary) hypertension: Secondary | ICD-10-CM

## 2021-09-06 DIAGNOSIS — E1142 Type 2 diabetes mellitus with diabetic polyneuropathy: Secondary | ICD-10-CM

## 2021-09-06 DIAGNOSIS — E113411 Type 2 diabetes mellitus with severe nonproliferative diabetic retinopathy with macular edema, right eye: Secondary | ICD-10-CM

## 2021-09-06 DIAGNOSIS — Z794 Long term (current) use of insulin: Secondary | ICD-10-CM

## 2021-09-06 LAB — POCT GLYCOSYLATED HEMOGLOBIN (HGB A1C): Hemoglobin A1C: 6.6 % — AB (ref 4.0–5.6)

## 2021-09-06 NOTE — Progress Notes (Signed)
Subjective:    Patient ID: [name], male    DOB: 07-26-1951, 70 y.o.   MRN: [id number]  Chief Complaint  Patient presents with   Follow-up    BP and DM     HPI Patient was seen today for f/u on chronic conditions.  Pt states he has been feeling good for the last month.  Sleeping better.  Following with Podiatry for h/o diabetic ulcers and cellulitis.  States feet are doing better.  Seeing Ophthalmology h/o retinopathy and macular edema.  Pt does not have any bs readings or bp recordings with him.  Past Medical History:  Diagnosis Date   Diabetes mellitus without complication (HCC)    Insomnia    Prostate cancer ([place])     No Known Allergies  ROS General: Denies fever, chills, night sweats, changes in weight, changes in appetite HEENT: Denies headaches, ear pain, changes in vision, rhinorrhea, sore throat CV: Denies CP, palpitations, SOB, orthopnea Pulm: Denies SOB, cough, wheezing GI: Denies abdominal pain, nausea, vomiting, diarrhea, constipation GU: Denies dysuria, hematuria, frequency Msk: Denies muscle cramps, joint pains Neuro: Denies weakness, numbness, tingling Skin: Denies rashes, bruising Psych: Denies depression, anxiety, hallucinations     Objective:    Blood pressure 138/84, pulse 76, temperature 98.2 F (36.8 C), temperature source Oral, weight 166 lb 3.2 oz (75.4 kg), SpO2 94 %.  Gen. Pleasant, well-nourished, in no distress, normal affect   HEENT: [place]/AT, face symmetric, conjunctiva clear, no scleral icterus, PERRLA, EOMI, nares patent without drainage Lungs: no accessory muscle use, CTAB, no wheezes or rales Cardiovascular: RRR, no m/r/g, no peripheral edema Musculoskeletal: No deformities, no cyanosis or clubbing, normal tone Neuro:  A&Ox3, CN II-XII intact, normal gait Skin:  Warm, no lesions/ rash   Wt Readings from Last 3 Encounters:  09/06/21 166 lb 3.2 oz (75.4 kg)  08/08/21 157 lb 3.2 oz (71.3 kg)  06/06/21 159 lb 3.2 oz (72.2 kg)     Lab Results  Component Value Date   WBC 3.6 (L) 03/20/2021   HGB 10.2 (L) 03/20/2021   HCT 28.4 (L) 03/20/2021   PLT 228.0 03/20/2021   GLUCOSE 86 03/20/2021   CHOL 137 09/07/2020   TRIG 56 09/07/2020   HDL 53 09/07/2020   LDLCALC 70 09/07/2020   ALT 15 03/20/2021   AST 17 03/20/2021   NA 142 03/20/2021   K 4.1 03/20/2021   CL 105 03/20/2021   CREATININE 1.34 03/20/2021   BUN 27 (H) 03/20/2021   CO2 31 03/20/2021   TSH 1.01 09/07/2020   PSA 6.49 (H) 03/21/2020   HGBA1C 6.6 (A) 09/06/2021   MICROALBUR 13.6 (H) 11/10/2018    Assessment/Plan:  Severe nonproliferative diabetic retinopathy of right eye with macular edema associated with type 2 diabetes mellitus ([place]) -stable -POC hgb A1C 6.6% this visit -continue f/u with Ophthalmology  Essential hypertension -improving.  Tighter controlled encouraged -lifestyle modifications -continue losartan 100 mg, HCTZ 12.5 mg  Type 2 diabetes mellitus with diabetic polyneuropathy, with long-term current use of insulin (HCC)  -hgb A1c 7.3% 03/20/21 -POC hgb A1c this visit 6.6% -lifestyle modifications -continue Novolin 70/30 20 units in am and 24 units with dinner -continue ARB and statin - Plan: POCT glycosylated hemoglobin (Hb A1C)  F/u in 3 months, sooner if needed.  Grier Mitts, MD

## 2021-09-06 NOTE — Patient Instructions (Addendum)
Your hemoglobin A1c was 6.6 this visit.  Continue to work on your blood sugar and your blood pressure.  At your next visit bringyour readings with you.  Continue current doses of medications.

## 2021-09-12 ENCOUNTER — Other Ambulatory Visit: Payer: Self-pay

## 2021-09-12 ENCOUNTER — Ambulatory Visit (INDEPENDENT_AMBULATORY_CARE_PROVIDER_SITE_OTHER): Payer: Medicare HMO | Admitting: Podiatrist

## 2021-09-12 DIAGNOSIS — L97511 Non-pressure chronic ulcer of other part of right foot limited to breakdown of skin: Secondary | ICD-10-CM | POA: Diagnosis not present

## 2021-09-12 DIAGNOSIS — R21 Rash and other nonspecific skin eruption: Secondary | ICD-10-CM

## 2021-09-12 DIAGNOSIS — L97521 Non-pressure chronic ulcer of other part of left foot limited to breakdown of skin: Secondary | ICD-10-CM

## 2021-09-12 NOTE — Progress Notes (Signed)
Chief Complaint  Patient presents with   Follow-up    Wound care Pt states left toe ulcers are healed. Pt states right 2nd and 3rd toe formed ulcers on the toe Monday.      HPI: Patient is 70 y.o. male who presents today for follow up of skin ulcers bilateral.  Relates a new blister has formed on the right fourth toe as well. Denies any constitutional signs of infection.   Patient Active Problem List   Diagnosis Date Noted   Delayed sleep phase syndrome 11/13/2020   Prostate cancer ([place]) 09/16/2020   Severe nonproliferative diabetic retinopathy of right eye with macular edema associated with type 2 diabetes mellitus ([place]) 08/28/2020   Age-related nuclear cataract, bilateral 08/28/2020   Hypertensive retinopathy of both eyes 08/28/2020   Bilateral carotid artery stenosis 08/28/2020   Murmur 08/28/2020   Diabetic macular edema ([place]) 07/13/2020   Mild nonproliferative diabetic retinopathy of right eye with macular edema associated with type 2 diabetes mellitus ([place]) 07/13/2020   BPH associated with nocturia 02/21/2020   Type 2 diabetes mellitus with diabetic polyneuropathy, without long-term current use of insulin ([place]) 07/06/2019   Dyslipidemia 07/06/2019   ED (erectile dysfunction) 12/24/2018   Osteoarthritis 11/25/2018   Uncontrolled type 2 diabetes mellitus with hyperglycemia ([place]) 11/12/2018   Hyperlipidemia associated with type 2 diabetes mellitus ([place]) 11/12/2018   Essential hypertension 11/10/2018   Insomnia 11/10/2018   Diabetes ([place]) 01/23/2016   Depression (emotion) 01/22/2016    Current Outpatient Medications on File Prior to Visit  Medication Sig Dispense Refill   atorvastatin (LIPITOR) 40 MG tablet TAKE 1 TABLET BY MOUTH EVERY DAY 90 tablet 1   blood glucose meter kit and supplies Dispense based on patient and insurance preference. Use to check glucose daily (FOR ICD-10 E10.9, E11.9). 1 each 0   cephALEXin (KEFLEX) 500 MG capsule Take 1 capsule (500 mg total) by  mouth 3 (three) times daily. 30 capsule 0   Continuous Blood Gluc Receiver (DEXCOM G6 RECEIVER) DEVI 1 Device by Does not apply route as directed. 1 each 0   Continuous Blood Gluc Sensor (DEXCOM G6 SENSOR) MISC 1 Device by Does not apply route as directed. 9 each 3   Continuous Blood Gluc Transmit (DEXCOM G6 TRANSMITTER) MISC 1 Device by Does not apply route as directed. 1 each 3   diltiazem (CARDIZEM LA) 360 MG 24 hr tablet Take 1 tablet (360 mg total) by mouth daily. 30 tablet 3   ferrous gluconate (FERGON) 324 MG tablet TAKE 1 TABLET BY MOUTH DAILY WITH BREAKFAST 90 tablet 1   hydrochlorothiazide (HYDRODIURIL) 12.5 MG tablet Take 1 tablet (12.5 mg total) by mouth daily. 30 tablet 3   insulin isophane & regular human (NOVOLIN 70/30 FLEXPEN) (70-30) 100 UNIT/ML KwikPen Inject 20 Units into the skin daily before breakfast AND 24 Units at bedtime. 15 mL 11   Insulin Pen Needle 31G X 8 MM MISC Use as directed for checking blood sugar 3 times daily, more frequently for hyper or hypoglycemia. 100 each 3   Lancet Devices (ONE TOUCH DELICA LANCING DEV) MISC 1 Device by Does not apply route as directed. 1 each 0   latanoprost (XALATAN) 0.005 % ophthalmic solution SMARTSIG:In Eye(s)     losartan (COZAAR) 100 MG tablet TAKE 1 TABLET BY MOUTH EVERY DAY 90 tablet 1   omeprazole (PRILOSEC) 40 MG capsule TAKE 1 CAPSULE BY MOUTH EVERY DAY 30 capsule 1   ondansetron (ZOFRAN ODT) 4 MG disintegrating tablet 59m  ODT q4 hours prn nausea/vomit 20 tablet 0   OneTouch Delica Lancets 34Y MISC 1 Device by Does not apply route as directed. Use to test up to 4x daily. 400 each 1   ONETOUCH ULTRA test strip USE UP TO 4X DAILY 300 strip 2   No current facility-administered medications on file prior to visit.    No Known Allergies  Review of Systems No fevers, chills, nausea, muscle aches, no difficulty breathing, no calf pain, no chest pain or shortness of breath.   Physical Exam   General: Patient is awake, alert,  oriented x 3 and in no acute distress.   Dermatology: Skin is warm and dry bilateral with a partial thickness ulceration present:  1.Left second toe. Ulceration is stable- measures 0.2 cm x 0.2 cm x 0.1cm. There is a  Keratotic border with a granular base. The ulceration does not  probe to bone. There is no malodor, no active drainage, no erythema, no edema.  2.  Right great toe ulceration distal tip that measures 0.8 cm x 0.8  cm x 0.1 cm with a keratotic border and a granular base the ulceration does not probe to bone there is no malodor minimal bloody active drainage localized erythema no significant edema and no warmth.  3.  Clear fluid blister noted to the right fourth toe proximal to the nail fold with no significant active drainage, no redness no warmth no malodor or acute signs of infection.   Vascular: Dorsalis Pedis pulse = 1/4 Bilateral,  Posterior Tibial pulse = 1/4 Bilateral,  Capillary Fill Time < 5 seconds   Neurologic: Protective absent bilateral.   Musculosketal: There is no pain to palpation to the ulcerated areas bilateral.   Assessment   1. Skin ulcer of right great toe, limited to breakdown of skin ([place])   2. Ulcer of left second toe, limited to breakdown of skin ([place])   3. Acute blistering eruption of skin      Plan  Patient evaluated and all questions answered.   -  pared the ulcer right great toe with a 15 blade to bleeding and viable tissue. Cleansed with wound wash and iodosorb applied with a dry and sterile compressive dressing.   - lanced the ulcer on the right third toe with a sterile 15 blade.  Applied iodosorb and a dressing to healing blisters toes 2,3,4 right foot. - recommended he continue at home wound care  - he will return in 2 weeks for recheck.  Will call sooner if any concerns arise.

## 2021-09-20 ENCOUNTER — Other Ambulatory Visit: Payer: Self-pay | Admitting: Family Medicine

## 2021-09-20 DIAGNOSIS — E1142 Type 2 diabetes mellitus with diabetic polyneuropathy: Secondary | ICD-10-CM

## 2021-09-23 ENCOUNTER — Telehealth: Payer: Self-pay

## 2021-09-23 DIAGNOSIS — E1142 Type 2 diabetes mellitus with diabetic polyneuropathy: Secondary | ICD-10-CM

## 2021-09-23 NOTE — Addendum Note (Signed)
Addended by: Rodrigo Ran on: 09/23/2021 03:26 PM   Modules accepted: Orders

## 2021-09-23 NOTE — Telephone Encounter (Signed)
Patient called requesting Rx refill Insulin Pen Needle 31G X 8 MM MISC

## 2021-09-24 ENCOUNTER — Other Ambulatory Visit: Payer: Self-pay | Admitting: Family Medicine

## 2021-09-24 DIAGNOSIS — E1142 Type 2 diabetes mellitus with diabetic polyneuropathy: Secondary | ICD-10-CM

## 2021-09-24 MED ORDER — INSULIN PEN NEEDLE 31G X 8 MM MISC: 3 refills | Status: DC

## 2021-09-24 NOTE — Addendum Note (Signed)
Addended by: Rodrigo Ran on: 09/24/2021 07:20 AM   Modules accepted: Orders

## 2021-09-26 ENCOUNTER — Ambulatory Visit: Payer: Medicare HMO | Admitting: Sports Medicine

## 2021-09-26 ENCOUNTER — Telehealth: Payer: Self-pay | Admitting: Family Medicine

## 2021-09-26 ENCOUNTER — Other Ambulatory Visit: Payer: Self-pay | Admitting: Family Medicine

## 2021-09-26 DIAGNOSIS — E1142 Type 2 diabetes mellitus with diabetic polyneuropathy: Secondary | ICD-10-CM

## 2021-09-26 NOTE — Telephone Encounter (Signed)
Called patient, no answer, and voice mail box is full. Need to know what needles he is needing, we are sending refilling Rx for needle requested by pharmacy.

## 2021-09-26 NOTE — Telephone Encounter (Signed)
Patient is requesting a call back from [place] because he said he is constantly getting the wrong needle for his pen.  Patient could be contacted at [phone].  Please advise.

## 2021-09-27 ENCOUNTER — Other Ambulatory Visit: Payer: Self-pay | Admitting: Family Medicine

## 2021-09-27 MED ORDER — BD PEN NEEDLE NANO 2ND GEN 32G X 4 MM MISC: 3 refills | Status: DC

## 2021-09-27 NOTE — Telephone Encounter (Signed)
Pt is calling back and said he needs 32g 5/32 inch 4 mm pen needle

## 2021-09-28 ENCOUNTER — Other Ambulatory Visit: Payer: Self-pay | Admitting: Family Medicine

## 2021-09-28 DIAGNOSIS — I10 Essential (primary) hypertension: Secondary | ICD-10-CM

## 2021-09-28 DIAGNOSIS — E1169 Type 2 diabetes mellitus with other specified complication: Secondary | ICD-10-CM

## 2021-10-03 ENCOUNTER — Ambulatory Visit (INDEPENDENT_AMBULATORY_CARE_PROVIDER_SITE_OTHER): Payer: Medicare HMO | Admitting: Sports Medicine

## 2021-10-03 ENCOUNTER — Other Ambulatory Visit: Payer: Self-pay

## 2021-10-03 ENCOUNTER — Encounter: Payer: Self-pay | Admitting: Sports Medicine

## 2021-10-03 ENCOUNTER — Ambulatory Visit: Payer: Medicare HMO

## 2021-10-03 DIAGNOSIS — L97521 Non-pressure chronic ulcer of other part of left foot limited to breakdown of skin: Secondary | ICD-10-CM

## 2021-10-03 DIAGNOSIS — E1142 Type 2 diabetes mellitus with diabetic polyneuropathy: Secondary | ICD-10-CM

## 2021-10-03 DIAGNOSIS — M79671 Pain in right foot: Secondary | ICD-10-CM

## 2021-10-03 DIAGNOSIS — E1165 Type 2 diabetes mellitus with hyperglycemia: Secondary | ICD-10-CM

## 2021-10-03 DIAGNOSIS — R21 Rash and other nonspecific skin eruption: Secondary | ICD-10-CM | POA: Diagnosis not present

## 2021-10-03 DIAGNOSIS — L97511 Non-pressure chronic ulcer of other part of right foot limited to breakdown of skin: Secondary | ICD-10-CM

## 2021-10-03 DIAGNOSIS — I10 Essential (primary) hypertension: Secondary | ICD-10-CM

## 2021-10-03 DIAGNOSIS — E1169 Type 2 diabetes mellitus with other specified complication: Secondary | ICD-10-CM

## 2021-10-03 DIAGNOSIS — E785 Hyperlipidemia, unspecified: Secondary | ICD-10-CM

## 2021-10-03 NOTE — Progress Notes (Signed)
Subjective: [name] is a 70 y.o. male patient seen in office for follow-up evaluation of ulcerations/blisters to toes on the right foot.  Patient reports that they are doing very well no longer draining does not hurt denies any redness warmth swelling or any constitutional symptoms at this time.  Patient Active Problem List   Diagnosis Date Noted   Delayed sleep phase syndrome 11/13/2020   Prostate cancer ([place]) 09/16/2020   Severe nonproliferative diabetic retinopathy of right eye with macular edema associated with type 2 diabetes mellitus ([place]) 08/28/2020   Age-related nuclear cataract, bilateral 08/28/2020   Hypertensive retinopathy of both eyes 08/28/2020   Bilateral carotid artery stenosis 08/28/2020   Murmur 08/28/2020   Diabetic macular edema ([place]) 07/13/2020   Mild nonproliferative diabetic retinopathy of right eye with macular edema associated with type 2 diabetes mellitus ([place]) 07/13/2020   BPH associated with nocturia 02/21/2020   Type 2 diabetes mellitus with diabetic polyneuropathy, without long-term current use of insulin ([place]) 07/06/2019   Dyslipidemia 07/06/2019   ED (erectile dysfunction) 12/24/2018   Osteoarthritis 11/25/2018   Uncontrolled type 2 diabetes mellitus with hyperglycemia ([place]) 11/12/2018   Hyperlipidemia associated with type 2 diabetes mellitus ([place]) 11/12/2018   Essential hypertension 11/10/2018   Insomnia 11/10/2018   Diabetes ([place]) 01/23/2016   Depression (emotion) 01/22/2016   Current Outpatient Medications on File Prior to Visit  Medication Sig Dispense Refill   atorvastatin (LIPITOR) 40 MG tablet TAKE 1 TABLET BY MOUTH EVERY DAY 90 tablet 1   blood glucose meter kit and supplies Dispense based on patient and insurance preference. Use to check glucose daily (FOR ICD-10 E10.9, E11.9). 1 each 0   cephALEXin (KEFLEX) 500 MG capsule Take 1 capsule (500 mg total) by mouth 3 (three) times daily. 30 capsule 0   Continuous Blood Gluc Receiver (DEXCOM  G6 RECEIVER) DEVI 1 Device by Does not apply route as directed. 1 each 0   Continuous Blood Gluc Sensor (DEXCOM G6 SENSOR) MISC 1 Device by Does not apply route as directed. 9 each 3   Continuous Blood Gluc Transmit (DEXCOM G6 TRANSMITTER) MISC 1 Device by Does not apply route as directed. 1 each 3   ferrous gluconate (FERGON) 324 MG tablet TAKE 1 TABLET BY MOUTH DAILY WITH BREAKFAST 90 tablet 1   hydrochlorothiazide (HYDRODIURIL) 12.5 MG tablet Take 1 tablet (12.5 mg total) by mouth daily. 30 tablet 3   insulin isophane & regular human (NOVOLIN 70/30 FLEXPEN) (70-30) 100 UNIT/ML KwikPen Inject 20 Units into the skin daily before breakfast AND 24 Units at bedtime. 15 mL 11   Insulin Pen Needle (BD PEN NEEDLE NANO 2ND GEN) 32G X 4 MM MISC To use with insulin pen twice a day. 200 each 3   Lancet Devices (ONE TOUCH DELICA LANCING DEV) MISC 1 Device by Does not apply route as directed. 1 each 0   latanoprost (XALATAN) 0.005 % ophthalmic solution SMARTSIG:In Eye(s)     losartan (COZAAR) 100 MG tablet TAKE 1 TABLET BY MOUTH EVERY DAY 90 tablet 1   MATZIM LA 360 MG 24 hr tablet TAKE 1 TABLET BY MOUTH DAILY. 90 tablet 1   omeprazole (PRILOSEC) 40 MG capsule TAKE 1 CAPSULE BY MOUTH EVERY DAY 30 capsule 1   ondansetron (ZOFRAN ODT) 4 MG disintegrating tablet 8m ODT q4 hours prn nausea/vomit 20 tablet 0   OneTouch Delica Lancets 330QMISC 1 Device by Does not apply route as directed. Use to test up to 4x daily. 400 each 1  ONETOUCH ULTRA test strip USE UP TO 4X DAILY 300 strip 2   No current facility-administered medications on file prior to visit.   No Known Allergies  Recent Results (from the past 2160 hour(s))  POCT glycosylated hemoglobin (Hb A1C)     Status: Abnormal   Collection Time: 09/06/21  8:46 AM  Result Value Ref Range   Hemoglobin A1C 6.6 (A) 4.0 - 5.6 %   HbA1c POC (<> result, manual entry)     HbA1c, POC (prediabetic range)     HbA1c, POC (controlled diabetic range)       Objective: There were no vitals filed for this visit.  General: Patient is awake, alert, oriented x 3 and in no acute distress.  Dermatology: Skin is warm and dry bilateral with a now healed wound at the right first and second toes.  Blisters to the smaller toes have now dried up and resolved there are no acute signs of infection at this time no redness no warmth no malodor.  Vascular: Dorsalis Pedis pulse = 1/4 Bilateral,  Posterior Tibial pulse = 1/4 Bilateral,  Capillary Fill Time < 5 seconds  Neurologic: Protective absent bilateral.  Musculosketal: There is no pain to palpation to healed ulcer/blistered areas.  No results for input(s): GRAMSTAIN, LABORGA in the last 8760 hours.  Assessment and Plan:  Problem List Items Addressed This Visit       Endocrine   Uncontrolled type 2 diabetes mellitus with hyperglycemia ([place])   Other Visit Diagnoses     Skin ulcer of right great toe, limited to breakdown of skin ([place])    -  Primary   Healed   Ulcer of left second toe, limited to breakdown of skin ([facility])       Healed   Acute blistering eruption of skin       Right foot pain          -Examined patient  -Previous wound/blisters are now healed -Advised patient to continue with close monitoring -May discontinue daily dressing changes -Advised patient to go to the ER or return to office if the wound recurs or if constitutional symptoms are present. -Advised patient to use shoes that do not rub the toes -Patient to return to office in 4 weeks for final wound check and evaluation or sooner if problems arise.  Landis Martins, DPM

## 2021-10-03 NOTE — Chronic Care Management (AMB) (Signed)
Chronic Care Management   CCM RN Visit Note  10/03/2021 Name: [name] MRN: [id number] DOB: 1951/10/12  Subjective: [name] is a 70 y.o. year old male who is a primary care patient of [name], MD. The care management team was consulted for assistance with disease management and care coordination needs.    Engaged with patient by telephone for follow up visit in response to provider referral for case management and/or care coordination services.   Consent to Services:  The patient was given information about Chronic Care Management services, agreed to services, and gave verbal consent prior to initiation of services.  Please see initial visit note for detailed documentation.   Patient agreed to services and verbal consent obtained.   Assessment: Review of patient past medical history, allergies, medications, health status, including review of consultants reports, laboratory and other test data, was performed as part of comprehensive evaluation and provision of chronic care management services.   SDOH (Social Determinants of Health) assessments and interventions performed:    CCM Care Plan  No Known Allergies  Outpatient Encounter Medications as of 10/03/2021  Medication Sig   atorvastatin (LIPITOR) 40 MG tablet TAKE 1 TABLET BY MOUTH EVERY DAY   blood glucose meter kit and supplies Dispense based on patient and insurance preference. Use to check glucose daily (FOR ICD-10 E10.9, E11.9).   cephALEXin (KEFLEX) 500 MG capsule Take 1 capsule (500 mg total) by mouth 3 (three) times daily.   Continuous Blood Gluc Receiver (DEXCOM G6 RECEIVER) DEVI 1 Device by Does not apply route as directed.   Continuous Blood Gluc Sensor (DEXCOM G6 SENSOR) MISC 1 Device by Does not apply route as directed.   Continuous Blood Gluc Transmit (DEXCOM G6 TRANSMITTER) MISC 1 Device by Does not apply route as directed.   ferrous gluconate (FERGON) 324 MG tablet TAKE 1 TABLET BY MOUTH DAILY  WITH BREAKFAST   hydrochlorothiazide (HYDRODIURIL) 12.5 MG tablet Take 1 tablet (12.5 mg total) by mouth daily.   insulin isophane & regular human (NOVOLIN 70/30 FLEXPEN) (70-30) 100 UNIT/ML KwikPen Inject 20 Units into the skin daily before breakfast AND 24 Units at bedtime.   Insulin Pen Needle (BD PEN NEEDLE NANO 2ND GEN) 32G X 4 MM MISC To use with insulin pen twice a day.   Lancet Devices (ONE TOUCH DELICA LANCING DEV) MISC 1 Device by Does not apply route as directed.   latanoprost (XALATAN) 0.005 % ophthalmic solution SMARTSIG:In Eye(s)   losartan (COZAAR) 100 MG tablet TAKE 1 TABLET BY MOUTH EVERY DAY   MATZIM LA 360 MG 24 hr tablet TAKE 1 TABLET BY MOUTH DAILY.   omeprazole (PRILOSEC) 40 MG capsule TAKE 1 CAPSULE BY MOUTH EVERY DAY   ondansetron (ZOFRAN ODT) 4 MG disintegrating tablet 53m ODT q4 hours prn nausea/vomit   OneTouch Delica Lancets 337TMISC 1 Device by Does not apply route as directed. Use to test up to 4x daily.   ONETOUCH ULTRA test strip USE UP TO 4X DAILY   No facility-administered encounter medications on file as of 10/03/2021.    Patient Active Problem List   Diagnosis Date Noted   Delayed sleep phase syndrome 11/13/2020   Prostate cancer ([place] 09/16/2020   Severe nonproliferative diabetic retinopathy of right eye with macular edema associated with type 2 diabetes mellitus ([place] 08/28/2020   Age-related nuclear cataract, bilateral 08/28/2020   Hypertensive retinopathy of both eyes 08/28/2020   Bilateral carotid artery stenosis 08/28/2020   Murmur 08/28/2020   Diabetic macular  edema ([place]) 07/13/2020   Mild nonproliferative diabetic retinopathy of right eye with macular edema associated with type 2 diabetes mellitus ([place]) 07/13/2020   BPH associated with nocturia 02/21/2020   Type 2 diabetes mellitus with diabetic polyneuropathy, without long-term current use of insulin ([place]) 07/06/2019   Dyslipidemia 07/06/2019   ED (erectile dysfunction) 12/24/2018    Osteoarthritis 11/25/2018   Uncontrolled type 2 diabetes mellitus with hyperglycemia ([place]) 11/12/2018   Hyperlipidemia associated with type 2 diabetes mellitus ([place]) 11/12/2018   Essential hypertension 11/10/2018   Insomnia 11/10/2018   Diabetes ([place]) 01/23/2016   Depression (em[place]) 01/22/2016    Conditions to be addressed/monitored:HTN, HLD, and DMII  Care Plan : Diabetes Type 2 (Adult)  Updates made by [name], RN since 10/03/2021 12:00 AM  Completed 10/03/2021   Problem: Lack of self management of Type 2 Diabetes Resolved 10/03/2021  Priority: High     Long-Range Goal: Effective Self Management of Type 2 Diabetes Completed 10/03/2021  Start Date: 05/09/2021  Expected End Date: 10/26/2021  Recent Progress: On track  Priority: High  Note:   Resolving due to duplicate goal  Objective:  Lab Results  Component Value Date   HGBA1C 7.3 (H) 03/20/2021   Lab Results  Component Value Date   CREATININE 1.34 03/20/2021   CREATININE 2.11 (H) 02/24/2021   CREATININE 1.35 (H) 09/07/2020   No results found for: EGFR Current Barriers:  Knowledge Deficits related to basic Diabetes pathophysiology and self care/management Knowledge Deficits related to medications used for management of diabetes Unable to independently self manage Type 2 diabetes and diabetic foot ulcer Does not adhere to provider recommendations re: diet and frequency of CBG monitoring Pt states that his CBGs range from 86-120 in the AM and the 120's later in the day.  States he does not always check his CBG 4 times a day and will only check if he feels it is too high or low.  Denies any hypoglycemia.  States he is trying to watch what he eat but sometimes likes something sweet.  States he tries to cook at home most of the time. States he is very active working on houses and cars.  States his foot ulcer is healed.  States he is checking is feet daily. Case Manager Clinical Goal(s):  patient will demonstrate  improved adherence to prescribed treatment plan for diabetes self care/management as evidenced by: daily monitoring and recording of CBG  adherence to ADA/ carb modified diet adherence to prescribed medication regimen contacting provider for new or worsened symptoms or questions Interventions:  Collaboration with [name], MD regarding development and update of comprehensive plan of care as evidenced by provider attestation and co-signature Inter-disciplinary care team collaboration (see longitudinal plan of care) Provided education to patient about basic DM disease process Reviewed medications with patient and discussed importance of medication adherence Discussed plans with patient for ongoing care management follow up and provided patient with direct contact information for care management team Provided patient with written educational materials related to hypo and hyperglycemia and importance of correct treatment Reviewed scheduled/upcoming provider appointments including: Dr. Volanda Napoleon 09/06/21 Advised patient, providing education and rationale, to check cbg before meals and at bedtime and record, calling provider for findings outside established parameters.   Review of patient status, including review of consultants reports, relevant laboratory and other test results, and medications completed. Reinforced fasting blood sugar goals of 80-130 and less than 180 1 1/2-2 hours after meals Reinforced importance of daily  foot care  and to discuss with podiatry about getting diabetic shoes Self-Care Activities - Self administers oral medications as prescribed Self administers insulin as prescribed Attends all scheduled provider appointments Checks blood sugars as prescribed and utilize hyper and hypoglycemia protocol as needed Adheres to prescribed ADA/carb modified Patient Goals: - check blood sugar at prescribed times - check blood sugar before and after exercise - check blood sugar if I  feel it is too high or too low - enter blood sugar readings and medication or insulin into daily log - take the blood sugar log to all doctor visits - take the blood sugar meter to all doctor visits - change to whole grain breads, cereal, pasta - drink 6 to 8 glasses of water each day - fill half of plate with vegetables - limit fast food meals to no more than 1 per week - manage portion size - prepare main meal at home 3 to 5 days each week - read food labels for fat, fiber, carbohydrates and portion size - switch to low-fat or skim milk - switch to sugar-free drinks - schedule appointment with eye doctor - check feet daily for cuts, sores or redness - keep feet up while sitting - trim toenails straight across - wash and dry feet carefully every day - wear comfortable, cotton socks - wear comfortable, well-fitting shoes Follow Up Plan: Telephone follow up appointment with care management team member scheduled for: 10/03/21 at 1 PM The patient has been provided with contact information for the care management team and has been advised to call with any health related questions or concerns.      Care Plan : Hypertension (Adult)  Updates made by [name], RN since 10/03/2021 12:00 AM  Completed 10/03/2021   Problem: Hypertension Resolved 10/03/2021  Priority: Medium     Long-Range Goal: Effective self management of Hypertension Completed 10/03/2021  Start Date: 05/09/2021  Expected End Date: 09/26/2021  Recent Progress: On track  Priority: Medium  Note:   Resolving due to duplicate goal  Objective:  Last practice recorded BP readings:  BP Readings from Last 3 Encounters:  04/24/21 (!) 160/90  04/17/21 (!) 155/82  03/20/21 (!) 180/80   Most recent eGFR/CrCl: No results found for: EGFR  No components found for: CRCL Current Barriers:  Knowledge Deficits related to basic understanding of hypertension pathophysiology and self care management Unable to independently self  manage hypertension with hx of HLD, DM2, diabetic foot ulcer, prostate CA Does not adhere to provider recommendations re: diet and B/P monitoring   States that  he is checking his B/P at home it is usually in the 120s/80s.  States he takes his B/P medications as ordered and tries to eat health most of the time.  States he does not use salt in his food. States he is very active working on houses and cars  Case Electrical engineer):  patient will verbalize understanding of plan for hypertension management patient will attend all scheduled medical appointments: Dr. Volanda Napoleon 09/06/21 patient will demonstrate improved adherence to prescribed treatment plan for hypertension as evidenced by taking all medications as prescribed, monitoring and recording blood pressure as directed, adhering to low sodium/DASH diet patient will demonstrate improved health management independence as evidenced by checking blood pressure as directed and notifying PCP if SBP>160 or DBP > 90, taking all medications as prescribe, and adhering to a low sodium diet as discussed. Interventions:  Collaboration with [name], MD regarding development and update of comprehensive plan of  care as evidenced by provider attestation and co-signature Inter-disciplinary care team collaboration (see longitudinal plan of care) Evaluation of current treatment plan related to hypertension self management and patient's adherence to plan as established by provider. Reinforced  stroke prevention, s/s of heart attack and stroke, DASH diet, complications of uncontrolled blood pressure Reviewed medications with patient and discussed importance of compliance Discussed plans with patient for ongoing care management follow up and provided patient with direct contact information for care management team Reinforced to monitor blood pressure daily and record, calling PCP for findings outside established parameters.  Reviewed scheduled/upcoming provider  appointments including: Dr. Volanda Napoleon 09/06/21 Reviewed lifestyle modification - low sodium diet, smoking cessation, weight control. medication compliance, exercise and stress Self-Care Activities: - Self administers medications as prescribed Attends all scheduled provider appointments Calls provider office for new concerns, questions, or BP outside discussed parameters Checks BP and records as discussed Follows a low sodium diet/DASH diet Patient Goals: - check blood pressure daily - choose a place to take my blood pressure (home, clinic or office, retail store) - write blood pressure results in a log or diary - ask questions to understand  Follow Up Plan: Telephone follow up appointment with care management team member scheduled for: 10/03/21 at 1 PM The patient has been provided with contact information for the care management team and has been advised to call with any health related questions or concerns.      Care Plan : RN Care Manager Plan of Care  Updates made by [name], RN since 10/03/2021 12:00 AM     Problem: Chronic Disease Management and Care Coordination Needs (DM,HTN and HLD)   Priority: High     Long-Range Goal: Establish Plan of Care for Chronic Disease Management Needs (DM,HTN and HLD)   Start Date: 10/03/2021  Expected End Date: 04/01/2022  Priority: High  Note:   Current Barriers:  Chronic Disease Management support and education needs related to HTN, HLD, and DMII  Pt states that his CBGs range from 120-130 in the AM and the 12145's later in the day.  States he is checking his CBG 1-2 times a day and if he feels it is too high or low.  Denies any hypoglycemia.  States he is trying to watch what he eat but sometimes likes something sweet.  States he tries to cook at home most of the time. States he is very active working on houses and cars.  States his foot ulcer is healed again and he is to see the podiatrist later today.  States he is checking is feet daily.  States that  he is checking his B/P at home it is usually in the 120s/80s.  States he takes his B/P medications as ordered and tries to eat health most of the time.  States he does not use salt in his food.   RNCM Clinical Goal(s):  Patient will verbalize understanding of plan for management of HTN, HLD, and DMII as evidenced by voiced adherence to plan of care verbalize basic understanding of  HTN, HLD, and DMII disease process and self health management plan as evidenced by voiced understanding and teach back take all medications exactly as prescribed and will call provider for medication related questions as evidenced by dispense report and pt verbalization attend all scheduled medical appointments: Dr. Volanda Napoleon 12/09/21 as evidenced by medical records demonstrate Improved adherence to prescribed treatment plan for HTN, HLD, and DMII as evidenced by readings within limits, voiced adherence to plan of  care continue to work with RN Care Manager to address care management and care coordination needs related to  HTN, HLD, and DMII as evidenced by adherence to CM Team Scheduled appointments through collaboration with RN Care manager, provider, and care team.   Interventions: 1:1 collaboration with primary care provider regarding development and update of comprehensive plan of care as evidenced by provider attestation and co-signature Inter-disciplinary care team collaboration (see longitudinal plan of care) Evaluation of current treatment plan related to  self management and patient's adherence to plan as established by provider   Diabetes Interventions:  (Status:  Goal on track:  Yes.) Long Term Goal Assessed patient's understanding of A1c goal: <7% Provided education to patient about basic DM disease process Reviewed medications with patient and discussed importance of medication adherence Counseled on importance of regular laboratory monitoring as prescribed Discussed plans with patient for ongoing  care management follow up and provided patient with direct contact information for care management team Provided patient with written educational materials related to hypo and hyperglycemia and importance of correct treatment Reviewed scheduled/upcoming provider appointments including: Dr. Volanda Napoleon 12/09/21 Advised patient, providing education and rationale, to check cbg 1-2 times a day and record, calling provider for findings outside established parameters Reviewed foot care and discussed getting diabetic socks and shoes Lab Results  Component Value Date   HGBA1C 6.6 (A) 09/06/2021   Hyperlipidemia Interventions:  (Status:  Goal on track:  Yes.) Long Term Goal Medication review performed; medication list updated in electronic medical record.  Provider established cholesterol goals reviewed Counseled on importance of regular laboratory monitoring as prescribed Reviewed role and benefits of statin for ASCVD risk reduction  Hypertension Interventions:  (Status:  Goal on track:  Yes.) Long Term Goal Last practice recorded BP readings:  BP Readings from Last 3 Encounters:  09/06/21 138/84  08/08/21 (!) 150/70  06/06/21 (!) 148/82  Most recent eGFR/CrCl: No results found for: EGFR  No components found for: CRCL  Evaluation of current treatment plan related to hypertension self management and patient's adherence to plan as established by provider Provided education to patient re: stroke prevention, s/s of heart attack and stroke Counseled on the importance of exercise goals with target of 150 minutes per week Discussed plans with patient for ongoing care management follow up and provided patient with direct contact information for care management team Advised patient, providing education and rationale, to monitor blood pressure daily and record, calling PCP for findings outside established parameters  Patient Goals/Self-Care Activities: Take all medications as prescribed Attend all scheduled  provider appointments Call pharmacy for medication refills 3-7 days in advance of running out of medications Call provider office for new concerns or questions  check blood sugar at prescribed times: twice daily and when you have symptoms of low or high blood sugar check feet daily for cuts, sores or redness take the blood sugar log to all doctor visits trim toenails straight across drink 6 to 8 glasses of water each day fill half of plate with vegetables manage portion size keep feet up while sitting wash and dry feet carefully every day wear comfortable, cotton socks wear comfortable, well-fitting shoes check blood pressure 3 times per week choose a place to take my blood pressure (home, clinic or office, retail store) take blood pressure log to all doctor appointments take medications for blood pressure exactly as prescribed begin an exercise program eat more whole grains, fruits and vegetables, lean meats and healthy fats limit salt intake to 2319m/day call  for medicine refill 2 or 3 days before it runs out take all medications exactly as prescribed call doctor with any symptoms you believe are related to your medicine  Follow Up Plan:  Telephone follow up appointment with care management team member scheduled for:  12/26/21 The patient has been provided with contact information for the care management team and has been advised to call with any health related questions or concerns.       Plan:Telephone follow up appointment with care management team member scheduled for:  12/26/21 The patient has been provided with contact information for the care management team and has been advised to call with any health related questions or concerns.  Peter Garter RN, Jackquline Denmark, CDE Care Management Coordinator [facility] [phone], Mobile [phone]

## 2021-10-03 NOTE — Patient Instructions (Signed)
Visit Information  Thank you for taking time to visit with me today. Please don't hesitate to contact me if I can be of assistance to you before our next scheduled telephone appointment.  Following are the goals we discussed today:  Take all medications as prescribed Attend all scheduled provider appointments Call pharmacy for medication refills 3-7 days in advance of running out of medications Call provider office for new concerns or questions  check blood sugar at prescribed times: twice daily and when you have symptoms of low or high blood sugar check feet daily for cuts, sores or redness take the blood sugar log to all doctor visits trim toenails straight across drink 6 to 8 glasses of water each day fill half of plate with vegetables manage portion size keep feet up while sitting wash and dry feet carefully every day wear comfortable, cotton socks wear comfortable, well-fitting shoes check blood pressure 3 times per week choose a place to take my blood pressure (home, clinic or office, retail store) take blood pressure log to all doctor appointments take medications for blood pressure exactly as prescribed begin an exercise program eat more whole grains, fruits and vegetables, lean meats and healthy fats limit salt intake to 2300mg /day call for medicine refill 2 or 3 days before it runs out take all medications exactly as prescribed call doctor with any symptoms you believe are related to your medicine  Our next appointment is by telephone on 12/26/21 at 2:15 PM  Please call the care guide team at [phone] if you need to cancel or reschedule your appointment.   If you are experiencing a Mental Health or [place] or need someone to talk to, please call the Suicide and Crisis Lifeline: 988 call 1-800-273-TALK (toll free, 24 hour hotline) go to [facility] [address] [place] [phone]) call 911   Patient  verbalizes understanding of instructions provided today and agrees to view in [place].  Peter Garter RN, Jackquline Denmark, CDE Care Management Coordinator [facility] [phone], Mobile [phone]

## 2021-10-31 ENCOUNTER — Other Ambulatory Visit: Payer: Self-pay

## 2021-10-31 ENCOUNTER — Ambulatory Visit (INDEPENDENT_AMBULATORY_CARE_PROVIDER_SITE_OTHER): Payer: Medicare HMO | Admitting: Sports Medicine

## 2021-10-31 DIAGNOSIS — E1165 Type 2 diabetes mellitus with hyperglycemia: Secondary | ICD-10-CM

## 2021-10-31 DIAGNOSIS — R21 Rash and other nonspecific skin eruption: Secondary | ICD-10-CM

## 2021-10-31 DIAGNOSIS — L853 Xerosis cutis: Secondary | ICD-10-CM | POA: Diagnosis not present

## 2021-10-31 NOTE — Progress Notes (Signed)
Subjective: [name] is a 71 y.o. male patient seen in office for follow-up evaluation of ulcerations/blisters to toes on the right foot.  Patient reports that they are doing very well and everything is still healed.  FBS not recorded.  Patient Active Problem List   Diagnosis Date Noted   Delayed sleep phase syndrome 11/13/2020   Prostate cancer ([place]) 09/16/2020   Severe nonproliferative diabetic retinopathy of right eye with macular edema associated with type 2 diabetes mellitus ([place]) 08/28/2020   Age-related nuclear cataract, bilateral 08/28/2020   Hypertensive retinopathy of both eyes 08/28/2020   Bilateral carotid artery stenosis 08/28/2020   Murmur 08/28/2020   Diabetic macular edema ([place]) 07/13/2020   Mild nonproliferative diabetic retinopathy of right eye with macular edema associated with type 2 diabetes mellitus ([place]) 07/13/2020   BPH associated with nocturia 02/21/2020   Type 2 diabetes mellitus with diabetic polyneuropathy, without long-term current use of insulin ([place]) 07/06/2019   Dyslipidemia 07/06/2019   ED (erectile dysfunction) 12/24/2018   Osteoarthritis 11/25/2018   Uncontrolled type 2 diabetes mellitus with hyperglycemia ([place]) 11/12/2018   Hyperlipidemia associated with type 2 diabetes mellitus ([place]) 11/12/2018   Essential hypertension 11/10/2018   Insomnia 11/10/2018   Diabetes ([place]) 01/23/2016   Depression (emotion) 01/22/2016   Current Outpatient Medications on File Prior to Visit  Medication Sig Dispense Refill   atorvastatin (LIPITOR) 40 MG tablet TAKE 1 TABLET BY MOUTH EVERY DAY 90 tablet 1   blood glucose meter kit and supplies Dispense based on patient and insurance preference. Use to check glucose daily (FOR ICD-10 E10.9, E11.9). 1 each 0   cephALEXin (KEFLEX) 500 MG capsule Take 1 capsule (500 mg total) by mouth 3 (three) times daily. 30 capsule 0   Continuous Blood Gluc Receiver (DEXCOM G6 RECEIVER) DEVI 1 Device by Does not apply route as  directed. 1 each 0   Continuous Blood Gluc Sensor (DEXCOM G6 SENSOR) MISC 1 Device by Does not apply route as directed. 9 each 3   Continuous Blood Gluc Transmit (DEXCOM G6 TRANSMITTER) MISC 1 Device by Does not apply route as directed. 1 each 3   ferrous gluconate (FERGON) 324 MG tablet TAKE 1 TABLET BY MOUTH DAILY WITH BREAKFAST 90 tablet 1   hydrochlorothiazide (HYDRODIURIL) 12.5 MG tablet Take 1 tablet (12.5 mg total) by mouth daily. 30 tablet 3   insulin isophane & regular human (NOVOLIN 70/30 FLEXPEN) (70-30) 100 UNIT/ML KwikPen Inject 20 Units into the skin daily before breakfast AND 24 Units at bedtime. 15 mL 11   Insulin Pen Needle (BD PEN NEEDLE NANO 2ND GEN) 32G X 4 MM MISC To use with insulin pen twice a day. 200 each 3   Lancet Devices (ONE TOUCH DELICA LANCING DEV) MISC 1 Device by Does not apply route as directed. 1 each 0   latanoprost (XALATAN) 0.005 % ophthalmic solution SMARTSIG:In Eye(s)     losartan (COZAAR) 100 MG tablet TAKE 1 TABLET BY MOUTH EVERY DAY 90 tablet 1   MATZIM LA 360 MG 24 hr tablet TAKE 1 TABLET BY MOUTH DAILY. 90 tablet 1   omeprazole (PRILOSEC) 40 MG capsule TAKE 1 CAPSULE BY MOUTH EVERY DAY 30 capsule 1   ondansetron (ZOFRAN ODT) 4 MG disintegrating tablet 28m ODT q4 hours prn nausea/vomit 20 tablet 0   OneTouch Delica Lancets 316XMISC 1 Device by Does not apply route as directed. Use to test up to 4x daily. 400 each 1   ONETOUCH ULTRA test strip USE UP TO 4X  DAILY 300 strip 2   No current facility-administered medications on file prior to visit.   No Known Allergies  Recent Results (from the past 2160 hour(s))  POCT glycosylated hemoglobin (Hb A1C)     Status: Abnormal   Collection Time: 09/06/21  8:46 AM  Result Value Ref Range   Hemoglobin A1C 6.6 (A) 4.0 - 5.6 %   HbA1c POC (<> result, manual entry)     HbA1c, POC (prediabetic range)     HbA1c, POC (controlled diabetic range)      Objective: There were no vitals filed for this  visit.  General: Patient is awake, alert, oriented x 3 and in no acute distress.  Dermatology: Skin is warm and dry bilateral with healed blisters. No wounds to any of the toes at this time.   Vascular: Dorsalis Pedis pulse = 1/4 Bilateral,  Posterior Tibial pulse = 1/4 Bilateral,  Capillary Fill Time < 5 seconds  Neurologic: Protective absent bilateral.  Musculosketal: There is no pain to palpation to healed ulcer/blistered areas.  No results for input(s): GRAMSTAIN, LABORGA in the last 8760 hours.  Assessment and Plan:  Problem List Items Addressed This Visit       Endocrine   Uncontrolled type 2 diabetes mellitus with hyperglycemia ([place])   Other Visit Diagnoses     Acute blistering eruption of skin    -  Primary   Resolved   Dry skin          -Examined patient  -Previous wound/blisters remain healed -Advised patient to continue with close monitoring -Advised daily skin emollients for the dry skin -Advised patient to go to the ER or return to office if the wound recurs or if constitutional symptoms are present. -Advised patient to use shoes that do not rub the toes like preivous -Patient to return to office as needed or sooner if problems arise.  Landis Martins, DPM

## 2021-11-05 ENCOUNTER — Telehealth: Payer: Self-pay | Admitting: Sports Medicine

## 2021-11-05 NOTE — Telephone Encounter (Signed)
Patient called stating that his foot has broken out with blisters again, he seen Dr Cannon Kettle last week and his foot was doing good.  Please advise - he wanted scheduled with a doctor for asap, I scheduled him for Monday with Dr Blenda Mounts.

## 2021-11-06 ENCOUNTER — Encounter: Payer: Self-pay | Admitting: Family Medicine

## 2021-11-06 ENCOUNTER — Ambulatory Visit (INDEPENDENT_AMBULATORY_CARE_PROVIDER_SITE_OTHER): Payer: Medicare HMO | Admitting: Family Medicine

## 2021-11-06 VITALS — BP 202/100 | HR 86 | Temp 97.9°F | Wt 160.8 lb

## 2021-11-06 DIAGNOSIS — S90821A Blister (nonthermal), right foot, initial encounter: Secondary | ICD-10-CM

## 2021-11-06 DIAGNOSIS — E1142 Type 2 diabetes mellitus with diabetic polyneuropathy: Secondary | ICD-10-CM

## 2021-11-06 DIAGNOSIS — I160 Hypertensive urgency: Secondary | ICD-10-CM | POA: Diagnosis not present

## 2021-11-06 DIAGNOSIS — T148XXA Other injury of unspecified body region, initial encounter: Secondary | ICD-10-CM

## 2021-11-06 DIAGNOSIS — R600 Localized edema: Secondary | ICD-10-CM

## 2021-11-06 DIAGNOSIS — I10 Essential (primary) hypertension: Secondary | ICD-10-CM

## 2021-11-06 MED ORDER — POTASSIUM CHLORIDE CRYS ER 20 MEQ PO TBCR: 20.0000 meq | EXTENDED_RELEASE_TABLET | Freq: Every day | ORAL | 3 refills | Status: DC

## 2021-11-06 MED ORDER — HYDROCHLOROTHIAZIDE 25 MG PO TABS: 25.0000 mg | ORAL_TABLET | Freq: Every day | ORAL | 3 refills | Status: DC

## 2021-11-06 NOTE — Patient Instructions (Addendum)
Keep your appointment on Monday with the podiatrist. A prescription for diabetic shoes was printed for you.  Take this with your visit to the podiatrist.  As they can help you order shoes.  Your hgb A1C was 6.6% in November 2022.

## 2021-11-06 NOTE — Progress Notes (Signed)
Subjective:    Patient ID: [name], male    DOB: 1951/09/27, 71 y.o.   MRN: [id number]  Chief Complaint  Patient presents with   Foot Swelling    Right foot swollen since 1/10, went to other doctor and was cleared. Woke up yesterday and foot was swollen.    HPI Patient was seen today for acute concern.  Patient endorses right pedal edema and blisters of 4/5 toes x 1 day.  Pt had a follow-up with podiatry on 10/31/2021 but did not have any problems at that visit.  Patient is unsure of what caused blisters on his feet.  Patient denies pain, wearing tight shoes, increased warmth, or erythema.  Patient states blood sugar in the 130s per memory.  BP elevated at [id number] systolic.  Patient had chicken that was saltier than he is used to eating at a restaurant last week.  Patient does not use extra salt when he cooks at home.  Contacted podiatry, but cannot get an appointment until Monday morning.  Past Medical History:  Diagnosis Date   Diabetes mellitus without complication (HCC)    Insomnia    Prostate cancer ([place])     No Known Allergies  ROS General: Denies fever, chills, night sweats, changes in weight, changes in appetite HEENT: Denies headaches, ear pain, changes in vision, rhinorrhea, sore throat CV: Denies CP, palpitations, SOB, orthopnea Pulm: Denies SOB, cough, wheezing GI: Denies abdominal pain, nausea, vomiting, diarrhea, constipation GU: Denies dysuria, hematuria, frequency Msk: Denies muscle cramps, joint pains Neuro: Denies weakness, numbness, tingling Skin: Denies rashes, bruising  + blisters of toes on right foot Psych: Denies depression, anxiety, hallucinations     Objective:    Blood pressure (!) 202/100, pulse 86, temperature 97.9 F (36.6 C), temperature source Oral, weight 160 lb 12.8 oz (72.9 kg), SpO2 95 %.  Gen. Pleasant, well-nourished, in no distress, normal affect   HEENT: [place]/AT, face symmetric, conjunctiva clear, nares patent without  drainage Lungs: no accessory muscle use, CTAB, no wheezes or rales Cardiovascular: RRR, no m/r/g, trace pedal edema right foot.  No edema in left LE. Musculoskeletal: No deformities, no cyanosis or clubbing, normal tone Neuro:  A&Ox3, CN II-XII intact, normal gait Skin:  Warm, dry, intact.  Skin dry of b/l feet and ankles.  Right great toe and 2 through 4 digits with blisters, no erythema, no drainage, no increased warmth.  Wt Readings from Last 3 Encounters:  11/06/21 160 lb 12.8 oz (72.9 kg)  09/06/21 166 lb 3.2 oz (75.4 kg)  08/08/21 157 lb 3.2 oz (71.3 kg)    Lab Results  Component Value Date   WBC 3.6 (L) 03/20/2021   HGB 10.2 (L) 03/20/2021   HCT 28.4 (L) 03/20/2021   PLT 228.0 03/20/2021   GLUCOSE 86 03/20/2021   CHOL 137 09/07/2020   TRIG 56 09/07/2020   HDL 53 09/07/2020   LDLCALC 70 09/07/2020   ALT 15 03/20/2021   AST 17 03/20/2021   NA 142 03/20/2021   K 4.1 03/20/2021   CL 105 03/20/2021   CREATININE 1.34 03/20/2021   BUN 27 (H) 03/20/2021   CO2 31 03/20/2021   TSH 1.01 09/07/2020   PSA 6.49 (H) 03/21/2020   HGBA1C 6.6 (A) 09/06/2021   MICROALBUR 13.6 (H) 11/10/2018    Assessment/Plan:  Blistering of skin -Likely 2/2 friction from shoes -Discussed wearing shoes with more room in toebox.  Given Rx for diabetic shoes -Supportive care including avoiding bursting blisters -Keep follow-up appointment with podiatry  on Monday  Essential hypertension  -Uncontrolled -We will increase HCTZ from 12.5 mg daily to 25 mg daily.   -We will also start Klor-Con 20 mEq daily -Continue matzim LA 360 mg and losartan 100 mg daily -Keep a log of BP readings and bring to clinic with you -Lifestyle modifications - Plan: potassium chloride SA (KLOR-CON M) 20 MEQ tablet, hydrochlorothiazide (HYDRODIURIL) 25 MG tablet  Hypertensive urgency  -Initial BP 202/100 -Patient asymptomatic - Plan: hydrochlorothiazide (HYDRODIURIL) 25 MG tablet  Type 2 diabetes mellitus with  diabetic polyneuropathy, without long-term current use of insulin (HCC)  -Hemoglobin A1c 6.6% in November 2022 -Patient strongly encouraged to keep a log of blood sugar readings to bring to clinic -Continue Novolin 70/30 20 units in a.m. and 24 units in p.m. -Continue Lipitor 40 mg daily and losartan 100 mg daily - Plan: For Home Use Only DME Diabetic Shoe  Pedal edema -Decrease sodium intake -Supportive care including elevating LEs when resting, TED hose/compression socks  F/u as needed  More than 50% of over 31-33 minutes spent in total in caring for this patient was spent face-to-face, reviewing the chart, counseling and/or coordinating care.   Grier Mitts, MD

## 2021-11-11 ENCOUNTER — Ambulatory Visit (INDEPENDENT_AMBULATORY_CARE_PROVIDER_SITE_OTHER): Payer: Self-pay | Admitting: Podiatry

## 2021-11-11 DIAGNOSIS — Z91199 Patient's noncompliance with other medical treatment and regimen due to unspecified reason: Secondary | ICD-10-CM

## 2021-11-11 NOTE — Progress Notes (Signed)
No show

## 2021-11-14 ENCOUNTER — Ambulatory Visit (INDEPENDENT_AMBULATORY_CARE_PROVIDER_SITE_OTHER): Payer: Self-pay | Admitting: Podiatrist

## 2021-11-14 ENCOUNTER — Other Ambulatory Visit: Payer: Self-pay

## 2021-11-14 DIAGNOSIS — L98491 Non-pressure chronic ulcer of skin of other sites limited to breakdown of skin: Secondary | ICD-10-CM

## 2021-11-14 DIAGNOSIS — R21 Rash and other nonspecific skin eruption: Secondary | ICD-10-CM

## 2021-11-14 NOTE — Patient Instructions (Signed)
Soak Instructions     Place 1/4 cup of epsom salts in a quart of warm tap water. continue to soak in the solution for 20 minutes.   Next, remove your foot or feet from solution, blot dry the affected area and cover

## 2021-11-21 ENCOUNTER — Ambulatory Visit (INDEPENDENT_AMBULATORY_CARE_PROVIDER_SITE_OTHER): Payer: Medicare HMO | Admitting: Podiatrist

## 2021-11-21 ENCOUNTER — Encounter: Payer: Self-pay | Admitting: Podiatrist

## 2021-11-21 ENCOUNTER — Other Ambulatory Visit: Payer: Self-pay

## 2021-11-21 DIAGNOSIS — R21 Rash and other nonspecific skin eruption: Secondary | ICD-10-CM

## 2021-11-21 DIAGNOSIS — L97511 Non-pressure chronic ulcer of other part of right foot limited to breakdown of skin: Secondary | ICD-10-CM | POA: Diagnosis not present

## 2021-11-21 MED ORDER — CEPHALEXIN 500 MG PO CAPS: 500.0000 mg | ORAL_CAPSULE | Freq: Three times a day (TID) | ORAL | 0 refills | Status: DC

## 2021-11-21 NOTE — Patient Instructions (Signed)
Betadine Soak Instructions  Purchase an 8 oz. bottle of BETADINE solution (Povidone)  THE DAY AFTER THE PROCEDURE  Place 1 tablespoon of betadine solution in a quart of warm tap water.  Submerge your foot or feet and soak for 20 minutes..  dry the feet well and apply ointment to the blistered skin.  You may cover.   IF YOUR SKIN BECOMES IRRITATED WHILE USING THESE INSTRUCTIONS, IT IS OKAY TO SWITCH TO EPSOM SALTS AND WATER OR WHITE VINEGAR AND WATER.

## 2021-11-21 NOTE — Progress Notes (Signed)
Right great toe blister has pospped.  Mild redness  Back in 2 weeks.   Chief Complaint  Patient presents with   Wound Check    "It concerned me because it started bleeding yesterday and last night it had an odor.  Today it seems to look a little better"     HPI: Patient is 71 y.o. male who presents today for follow up of blisters on the right great toe, right fourth toe and posterior heel.  He states that the blister on the right great toe started to concern him because it became malodorous.  He denies any active pus or purulence from the area denies any significant pain to the right great toe.  No Known Allergies  Review of systems is negative except as noted in the HPI.  Denies nausea/ vomiting/ fevers/ chills or night sweats.   Denies difficulty breathing, denies calf pain or tenderness   Physical Exam   GENERAL APPEARANCE: Alert, conversant. Appropriately groomed. No acute distress.    VASCULAR: Pedal pulses palpable1/4  DP and PT bilateral.  Capillary refill time is immediate to all digits,  Proximal to distal cooling it warm to warm.  Digital hair growth is absent    NEUROLOGIC: sensation is decreased to epicritically and protectively bilateral as per baseline.    MUSCULOSKELETAL: acceptable muscle strength, tone and stability bilateral.  No gross boney pedal deformities noted.  No pain, crepitus or limitation noted with foot and ankle range of motion bilateral.    DERMATOLOGIC:    Ulcer distal plantar aspect of right great toe.  Measures 2 cm x 2 cm x 0/3 cm with a fibrogranular base with malodor present. No pus or pirulence noted. No drainage noted today.. no redness or swelling seen. -  (previous MRI to suggest osteomyelitis).     Drained blister at the fourth toe and posterior heel are healing well. No malodor or soi noted.      Assessment:   ICD-10-CM   1. Acute blistering eruption of skin  R21     2. Skin ulcer of right great toe, limited to breakdown of skin [facility])   L97.511        Plan: Discussed exam findings with the patient.  I debrided all necrotic tissue and tissue surrounding the ulceration of the distal tip of the right hallux.  This does appear to be the source of the malodor and this was debrided well at today's visit.  It was cleansed with wound wash and Iodosorb and a dry sterile compressive dressing was applied.  The patient was instructed on use of Iodosorb at home with a dry sterile compressive dressing.  He will be seen back in a week for follow-up I did call in a prescription for Keflex due to the malodor in the presence of the ulceration at today's visit.  If any increased redness, malodor, pain arise he is noted to call immediately.

## 2021-11-21 NOTE — Progress Notes (Signed)
Chief Complaint  Patient presents with   Follow-up    Right foot blisters .     HPI: Patient is 71 y.o. male who presents today for blisters that have erupted on the toes of the right foot.  The right great toe as well as the lateral side of the 4th toe and posterior achilles area are affected.  He has been unable to identify any causitive agents such as shoes or detergents to cause these blisters to occur.  Relates he will be doing well and all of a sudden, water blisters will come up on his toes.    Patient Active Problem List   Diagnosis Date Noted   Delayed sleep phase syndrome 11/13/2020   Prostate cancer ([place]) 09/16/2020   Severe nonproliferative diabetic retinopathy of right eye with macular edema associated with type 2 diabetes mellitus ([place]) 08/28/2020   Age-related nuclear cataract, bilateral 08/28/2020   Hypertensive retinopathy of both eyes 08/28/2020   Bilateral carotid artery stenosis 08/28/2020   Murmur 08/28/2020   Diabetic macular edema ([place]) 07/13/2020   Mild nonproliferative diabetic retinopathy of right eye with macular edema associated with type 2 diabetes mellitus ([place]) 07/13/2020   BPH associated with nocturia 02/21/2020   Type 2 diabetes mellitus with diabetic polyneuropathy, without long-term current use of insulin ([place]) 07/06/2019   Dyslipidemia 07/06/2019   ED (erectile dysfunction) 12/24/2018   Osteoarthritis 11/25/2018   Uncontrolled type 2 diabetes mellitus with hyperglycemia ([place]) 11/12/2018   Hyperlipidemia associated with type 2 diabetes mellitus ([place]) 11/12/2018   Essential hypertension 11/10/2018   Insomnia 11/10/2018   Diabetes ([place]) 01/23/2016   Depression (emotion) 01/22/2016    Current Outpatient Medications on File Prior to Visit  Medication Sig Dispense Refill   atorvastatin (LIPITOR) 40 MG tablet TAKE 1 TABLET BY MOUTH EVERY DAY 90 tablet 1   blood glucose meter kit and supplies Dispense based on patient and insurance preference. Use to  check glucose daily (FOR ICD-10 E10.9, E11.9). 1 each 0   Continuous Blood Gluc Receiver (DEXCOM G6 RECEIVER) DEVI 1 Device by Does not apply route as directed. 1 each 0   Continuous Blood Gluc Sensor (DEXCOM G6 SENSOR) MISC 1 Device by Does not apply route as directed. 9 each 3   Continuous Blood Gluc Transmit (DEXCOM G6 TRANSMITTER) MISC 1 Device by Does not apply route as directed. 1 each 3   ferrous gluconate (FERGON) 324 MG tablet TAKE 1 TABLET BY MOUTH DAILY WITH BREAKFAST 90 tablet 1   hydrochlorothiazide (HYDRODIURIL) 25 MG tablet Take 1 tablet (25 mg total) by mouth daily. 90 tablet 3   insulin isophane & regular human (NOVOLIN 70/30 FLEXPEN) (70-30) 100 UNIT/ML KwikPen Inject 20 Units into the skin daily before breakfast AND 24 Units at bedtime. 15 mL 11   Insulin Pen Needle (BD PEN NEEDLE NANO 2ND GEN) 32G X 4 MM MISC To use with insulin pen twice a day. 200 each 3   Lancet Devices (ONE TOUCH DELICA LANCING DEV) MISC 1 Device by Does not apply route as directed. 1 each 0   latanoprost (XALATAN) 0.005 % ophthalmic solution SMARTSIG:In Eye(s)     losartan (COZAAR) 100 MG tablet TAKE 1 TABLET BY MOUTH EVERY DAY 90 tablet 1   MATZIM LA 360 MG 24 hr tablet TAKE 1 TABLET BY MOUTH DAILY. 90 tablet 1   omeprazole (PRILOSEC) 40 MG capsule TAKE 1 CAPSULE BY MOUTH EVERY DAY 30 capsule 1   ondansetron (ZOFRAN ODT) 4 MG disintegrating tablet 65m ODT  q4 hours prn nausea/vomit 20 tablet 0   OneTouch Delica Lancets 82M MISC 1 Device by Does not apply route as directed. Use to test up to 4x daily. 400 each 1   ONETOUCH ULTRA test strip USE UP TO 4X DAILY 300 strip 2   potassium chloride SA (KLOR-CON M) 20 MEQ tablet Take 1 tablet (20 mEq total) by mouth daily. 90 tablet 3   No current facility-administered medications on file prior to visit.    No Known Allergies  Review of Systems No fevers, chills, nausea, muscle aches, no difficulty breathing, no calf pain, no chest pain or shortness of  breath.   Physical Exam  Physical Exam   GENERAL APPEARANCE: Alert, conversant. Appropriately groomed. No acute distress.    VASCULAR: Pedal pulses palpable DP and PT bilateral.  Capillary refill time is immediate to all digits,  Proximal to distal cooling it warm to warm.  Digital perfusion adequate.    NEUROLOGIC: sensation is decreased to epicritically and protectively bilateral as per baseline.    MUSCULOSKELETAL: acceptable muscle strength, tone and stability bilateral.  No gross boney pedal deformities noted.  No pain, crepitus or limitation noted with foot and ankle range of motion bilateral.    DERMATOLOGIC: skin is warm, supple, and dry. Blisters present on the right great toe lateral side as well as the lateral side of the right fourth toe and posterior achilles at the posterior calcaneus.  Blisters have a clear fluid expressed when drained.        Assessment     ICD-10-CM   1. Acute blistering eruption of skin  R21     2. Superficial ulcer of skin ([place])  L98.491        Plan  Exam findings and treatment recommendations were discussed with the patient.  At today's visit I did drain the blisters and applied Iodosorb and a dry sterile compressive dressing to the foot.  I recommended continued at home use of Iodosorb and mupirocin ointment as well as compressive dressings.  If he notices any increase in redness, swelling, malodor, pus or purulence he is instructed to call immediately.  He will be seen back in 5 to 10 days for recheck.

## 2021-11-28 ENCOUNTER — Encounter: Payer: Self-pay | Admitting: Podiatrist

## 2021-11-28 ENCOUNTER — Ambulatory Visit: Payer: Medicare HMO | Admitting: Podiatrist

## 2021-11-28 ENCOUNTER — Other Ambulatory Visit: Payer: Self-pay

## 2021-11-28 DIAGNOSIS — L97511 Non-pressure chronic ulcer of other part of right foot limited to breakdown of skin: Secondary | ICD-10-CM

## 2021-11-28 MED ORDER — KETOCONAZOLE 2 % EX CREA: TOPICAL_CREAM | CUTANEOUS | 2 refills | Status: DC

## 2021-11-28 NOTE — Progress Notes (Signed)
Chief Complaint  Patient presents with   Foot Ulcer    Follow up ulcerations toes of right foot   "They look better today, not really sore"     HPI: Patient is 71 y.o. male who presents today for follow-up of ulcer distal tip of the right foot and for blisters of toes 2 3 and 4 of the right foot.  He states that he is not really sore the areas look better he has been applying Iodosorb and a bandage to the lesions.    No Known Allergies  Review of systems is negative except as noted in the HPI.  Denies nausea/ vomiting/ fevers/ chills or night sweats.   Denies difficulty breathing, denies calf pain or tenderness  Physical Exam  GENERAL APPEARANCE: Alert, conversant. Appropriately groomed. No acute distress.    VASCULAR: Pedal pulses palpable1/4  DP and PT bilateral.  Capillary refill time is immediate to all digits,  Proximal to distal cooling it warm to warm.  Digital hair growth is absent    NEUROLOGIC: sensation is decreased to epicritically and protectively bilateral as per baseline.    MUSCULOSKELETAL: acceptable muscle strength, tone and stability bilateral.  No gross boney pedal deformities noted.  No pain, crepitus or limitation noted with foot and ankle range of motion bilateral.    DERMATOLOGIC:    Ulcer distal plantar aspect of right great toe.  Measures 1 cm x 1.2 cm x 0.2 cm with a fibrogranular base with no malodor present. No pus or pirulence noted. No drainage noted today.. no redness or swelling seen. -  (previous MRI to suggest osteomyelitis).     Drained blister at the second, third and  fourth toe and posterior heel are healing well. No malodor or soi noted.   Assessment:   ICD-10-CM   1. Skin ulcer of right great toe, limited to breakdown of skin ([place])  L97.511        Plan: Discussed exam findings with the patient.  Discussed that the blisters appear to be healed with intact integument beneath.  He does continue to have a blood ulceration of the distal tip of  the right hallux which today is improved in size from last visit.  I did debride necrotic tissue to the level of bleeding healthy tissue and applied Iodosorb and a dry sterile compressive dressing to this ulcer.  Recommended he continue use Iodosorb at home.  He will be seen back in 2 weeks for recheck and follow-up if any redness, swelling, pain or recurrence of blistering occurs he will call. I did also call in ketoconazole for him to use to see if this helps with his potential to blister.  Again he will call if any problems arise and he will be seen back in 2 weeks

## 2021-12-09 ENCOUNTER — Ambulatory Visit (INDEPENDENT_AMBULATORY_CARE_PROVIDER_SITE_OTHER): Payer: Medicare HMO | Admitting: Family Medicine

## 2021-12-09 ENCOUNTER — Encounter: Payer: Self-pay | Admitting: Family Medicine

## 2021-12-09 ENCOUNTER — Ambulatory Visit: Payer: Medicare HMO | Admitting: Family Medicine

## 2021-12-09 VITALS — BP 150/78 | HR 62 | Temp 98.0°F | Ht 66.0 in | Wt 158.0 lb

## 2021-12-09 DIAGNOSIS — E113411 Type 2 diabetes mellitus with severe nonproliferative diabetic retinopathy with macular edema, right eye: Secondary | ICD-10-CM

## 2021-12-09 DIAGNOSIS — T148XXA Other injury of unspecified body region, initial encounter: Secondary | ICD-10-CM | POA: Diagnosis not present

## 2021-12-09 DIAGNOSIS — I10 Essential (primary) hypertension: Secondary | ICD-10-CM | POA: Diagnosis not present

## 2021-12-09 MED ORDER — TRIAMTERENE-HCTZ 37.5-25 MG PO CAPS: 1.0000 | ORAL_CAPSULE | Freq: Every day | ORAL | 3 refills | Status: DC

## 2021-12-09 MED ORDER — DILTIAZEM HCL ER COATED BEADS 360 MG PO TB24: 360.0000 mg | ORAL_TABLET | Freq: Every day | ORAL | 1 refills | Status: DC

## 2021-12-09 NOTE — Patient Instructions (Addendum)
Stop taking hydrochlorothiazide 25 mg daily and the potassium supplement Klor-Con 20 mEq daily.    A prescription for a new medication Dyazide 37.5-25 mg was sent to your pharmacy.  This new medication has been hydrochlorothiazide 25 mg in it along with a medication called triamterene 37.5 mg.   Continue taking losartan 100 mg daily and Matzim LA from 360 mg daily.  It is important the next time you come to clinic to bring your blood pressure monitor and your blood sugar readings with you.  This helps to know how to adjust your medications.

## 2021-12-09 NOTE — Progress Notes (Signed)
Subjective:    Patient ID: [name], male    DOB: 1951-03-19, 71 y.o.   MRN: [id number]  No chief complaint on file.   HPI Patient was seen today for f/u on HTN.  Patient taking losartan 100 mg daily, HCTZ 25 mg daily, and Matzim LA (diltiazem) 360 mg daily.  Patient does not have a recording of his blood pressure readings or blood sugars.  Patient states blood sugar ranges 60-170.  States does not cook with much salt and trying to increase water intake.  Pt continuing to follow with podiatry for blisters on feet.  States blisters have improved.  Still unclear what is causing the blisters to appear.  Patient states he was told he may be allergic to the material in his boots.  Past Medical History:  Diagnosis Date   Diabetes mellitus without complication (HCC)    Insomnia    Prostate cancer ([place])     No Known Allergies  ROS General: Denies fever, chills, night sweats, changes in weight, changes in appetite HEENT: Denies headaches, ear pain, changes in vision, rhinorrhea, sore throat CV: Denies CP, palpitations, SOB, orthopnea Pulm: Denies SOB, cough, wheezing GI: Denies abdominal pain, nausea, vomiting, diarrhea, constipation GU: Denies dysuria, hematuria, frequency Msk: Denies muscle cramps, joint pains Neuro: Denies weakness, numbness, tingling Skin: Denies rashes, bruising Psych: Denies depression, anxiety, hallucinations     Objective:    Blood pressure (!) 150/78, pulse 62, temperature 98 F (36.7 C), temperature source Oral, height 5\' 6"  (1.676 m), weight 158 lb (71.7 kg), SpO2 97 %.  BP recheck 148/75, right arm, sitting  Gen. Pleasant, well-nourished, in no distress, normal affect   HEENT: [place]/AT, face symmetric, conjunctiva clear, no scleral icterus, PERRLA, EOMI, nares patent without drainage Lungs: no accessory muscle use, CTAB, no wheezes or rales Cardiovascular: RRR, no m/r/g, no peripheral edema Musculoskeletal: No deformities, no cyanosis or clubbing, normal  tone Neuro:  A&Ox3, CN II-XII intact, normal gait Skin:  Warm, no lesions/ rash  Wt Readings from Last 3 Encounters:  12/09/21 158 lb (71.7 kg)  11/06/21 160 lb 12.8 oz (72.9 kg)  09/06/21 166 lb 3.2 oz (75.4 kg)    Lab Results  Component Value Date   WBC 3.6 (L) 03/20/2021   HGB 10.2 (L) 03/20/2021   HCT 28.4 (L) 03/20/2021   PLT 228.0 03/20/2021   GLUCOSE 86 03/20/2021   CHOL 137 09/07/2020   TRIG 56 09/07/2020   HDL 53 09/07/2020   LDLCALC 70 09/07/2020   ALT 15 03/20/2021   AST 17 03/20/2021   NA 142 03/20/2021   K 4.1 03/20/2021   CL 105 03/20/2021   CREATININE 1.34 03/20/2021   BUN 27 (H) 03/20/2021   CO2 31 03/20/2021   TSH 1.01 09/07/2020   PSA 6.49 (H) 03/21/2020   HGBA1C 6.6 (A) 09/06/2021   MICROALBUR 13.6 (H) 11/10/2018    Assessment/Plan:  Essential hypertension  -Uncontrolled -Repeat BP 148/75 -We will discontinue Klor-Con 20 mEq and hydrochlorothiazide 25 mg. -We will start triamterene-hydrochlorothiazide 37.5 mg - 25 mg. -Continue losartan 100 mg daily and diltiazem LA 360 mg -Patient advised to bring BP readings and/or monitor up with him to next office visit so adjustments can be made if needed to medications. - Plan: Basic metabolic panel, CBC with Differential/Platelet, TSH, diltiazem (MATZIM LA) 360 MG 24 hr tablet, triamterene-hydrochlorothiazide (DYAZIDE) 37.5-25 MG capsule  Severe nonproliferative diabetic retinopathy of right eye with macular edema associated with type 2 diabetes mellitus ([place]) -Hemoglobin A1c 6.6%  on 09/06/2022. -Continue Novolin 70/30 20 units twice daily -Continue statin, ARB -Patient encouraged to bring glucometer or readings with him to clinic visits. -Continue follow-up with podiatry and ophthalmology  Blister -R foot blister resolved -continue f/u with Podiatry  - Plan: CBC with Differential/Platelet  F/u in 1 month  Grier Mitts, MD

## 2021-12-11 ENCOUNTER — Encounter: Payer: Self-pay | Admitting: Podiatry

## 2021-12-11 ENCOUNTER — Ambulatory Visit: Payer: Medicare HMO | Admitting: Podiatry

## 2021-12-11 ENCOUNTER — Other Ambulatory Visit: Payer: Self-pay

## 2021-12-11 DIAGNOSIS — E1165 Type 2 diabetes mellitus with hyperglycemia: Secondary | ICD-10-CM

## 2021-12-11 DIAGNOSIS — L97511 Non-pressure chronic ulcer of other part of right foot limited to breakdown of skin: Secondary | ICD-10-CM | POA: Diagnosis not present

## 2021-12-11 NOTE — Progress Notes (Signed)
°  Subjective:  Patient ID: [name], male    DOB: 06-Jan-1951,   MRN: [id number]  Chief Complaint  Patient presents with   Foot Ulcer    I am doing a lot better on the right foot and I have been using the cream and there is not any draining     71 y.o. male presents for for follow-up of right foot ulcer. Relates he things it is doing better and has not been draining. Has been in wearing his regular boots and applying cream to the area. Denies any other pedal complaints. Denies n/v/f/c.   Past Medical History:  Diagnosis Date   Diabetes mellitus without complication (HCC)    Insomnia    Prostate cancer ([place])     Objective:  Physical Exam: Vascular: DP/PT pulses 2/4 bilateral. CFT <3 seconds. Normal hair growth on digits. No edema.  Skin. No lacerations or abrasions bilateral feet. Ulcer noted to ditstal hallux measuring 0.5 cm x 0.4 cm x 0.1 cm . Distal second digit 0.3 cmx 0.3 cm x 0.1 cm . Distal third digit measuirng 1 cm x 0.5 cm x 0.1 cm. Mild erythema and edema noted to the toes.  Musculoskeletal: MMT 5/5 bilateral lower extremities in DF, PF, Inversion and Eversion. Deceased ROM in DF of ankle joint.  Neurological: Sensation intact to light touch.   Assessment:   1. Skin ulcer of right great toe, limited to breakdown of skin ([place])   2. Skin ulcer of second toe of right foot, limited to breakdown of skin ([place])   3. Skin ulcer of third toe of right foot, limited to breakdown of skin ([place])   4. Uncontrolled type 2 diabetes mellitus with hyperglycemia ([place])      Plan:  Patient was evaluated and treated and all questions answered. Ulcer distal hallux, second, and third hallux limited to breakdown of skin.  -Debridement as below. -Dressed with iodosorb , DSD. -Off-loading with surgical shoe. Patient has one at home  -No abx indicated.  -Discussed glucose control and proper protein-rich diet.  -Discussed if any worsening redness, pain, fever or chills to call or may need  to report to the emergency room. Patient expressed understanding.   Procedure: Excisional Debridement of Wound Rationale: Removal of non-viable soft tissue from the wound to promote healing.  Anesthesia: none Pre-Debridement Wound Measurements: Overlying callus  Post-Debridement Wound Measurements: 0.5 cmx 0.4 cmx 0.1 cm hallux 0.3 cm x 0.3 cm x 0.1 Second digit  cm  0.5 x 1.0 cm x 0.1 cm third digit Type of Debridement: Sharp Excisional Tissue Removed: Non-viable soft tissue Depth of Debridement: subcutaneous tissue. Technique: Sharp excisional debridement to bleeding, viable wound base.  Dressing: Dry, sterile, compression dressing. Disposition: Patient tolerated procedure well. Patient to return in 2 week for follow-up.  Return in about 2 weeks (around 12/25/2021) for wound check.   Lorenda Peck, DPM

## 2021-12-17 ENCOUNTER — Telehealth: Payer: Self-pay | Admitting: Family Medicine

## 2021-12-17 DIAGNOSIS — L03116 Cellulitis of left lower limb: Secondary | ICD-10-CM

## 2021-12-17 DIAGNOSIS — L84 Corns and callosities: Secondary | ICD-10-CM

## 2021-12-17 DIAGNOSIS — R238 Other skin changes: Secondary | ICD-10-CM

## 2021-12-17 DIAGNOSIS — E11621 Type 2 diabetes mellitus with foot ulcer: Secondary | ICD-10-CM

## 2021-12-17 NOTE — Telephone Encounter (Signed)
Pt call and stated he want dr.Banks to give him a call back about his foot .

## 2021-12-19 NOTE — Telephone Encounter (Signed)
Okay for new referral to podiatry for second opinion.

## 2021-12-20 NOTE — Telephone Encounter (Signed)
Referral placed for second opinion.

## 2021-12-25 ENCOUNTER — Ambulatory Visit (INDEPENDENT_AMBULATORY_CARE_PROVIDER_SITE_OTHER): Payer: Medicare HMO | Admitting: Podiatry

## 2021-12-25 DIAGNOSIS — Z91199 Patient's noncompliance with other medical treatment and regimen due to unspecified reason: Secondary | ICD-10-CM

## 2021-12-25 NOTE — Progress Notes (Signed)
No show

## 2021-12-26 ENCOUNTER — Ambulatory Visit (INDEPENDENT_AMBULATORY_CARE_PROVIDER_SITE_OTHER): Payer: Medicare HMO

## 2021-12-26 DIAGNOSIS — L97519 Non-pressure chronic ulcer of other part of right foot with unspecified severity: Secondary | ICD-10-CM

## 2021-12-26 DIAGNOSIS — E1169 Type 2 diabetes mellitus with other specified complication: Secondary | ICD-10-CM

## 2021-12-26 DIAGNOSIS — E1142 Type 2 diabetes mellitus with diabetic polyneuropathy: Secondary | ICD-10-CM

## 2021-12-26 DIAGNOSIS — E11621 Type 2 diabetes mellitus with foot ulcer: Secondary | ICD-10-CM

## 2021-12-26 DIAGNOSIS — I10 Essential (primary) hypertension: Secondary | ICD-10-CM

## 2021-12-26 DIAGNOSIS — E785 Hyperlipidemia, unspecified: Secondary | ICD-10-CM

## 2021-12-26 NOTE — Patient Instructions (Signed)
Visit Information ? ?Thank you for taking time to visit with me today. Please don't hesitate to contact me if I can be of assistance to you before our next scheduled telephone appointment. ? ?Following are the goals we discussed today:  ?Take all medications as prescribed ?Attend all scheduled provider appointments ?Call pharmacy for medication refills 3-7 days in advance of running out of medications ?Call provider office for new concerns or questions  ?check blood sugar at prescribed times: twice daily and when you have symptoms of low or high blood sugar ?check feet daily for cuts, sores or redness ?enter blood sugar readings and medication or insulin into daily log ?take the blood sugar log to all doctor visits ?trim toenails straight across ?drink 6 to 8 glasses of water each day ?fill half of plate with vegetables ?manage portion size ?prepare main meal at home 3 to 5 days each week ?keep feet up while sitting ?wash and dry feet carefully every day ?wear comfortable, cotton socks ?wear comfortable, well-fitting shoes ?check blood pressure daily ?choose a place to take my blood pressure (home, clinic or office, retail store) ?write blood pressure results in a log or diary ?take blood pressure log to all doctor appointments ?call doctor for signs and symptoms of high blood pressure ?take medications for blood pressure exactly as prescribed ?begin an exercise program ?eat more whole grains, fruits and vegetables, lean meats and healthy fats ?limit salt intake to 2300mg /day ?call for medicine refill 2 or 3 days before it runs out ?take all medications exactly as prescribed ?call doctor with any symptoms you believe are related to your medicine ? ?Our next appointment is by telephone on 03/13/22 at 2:15 PM ? ?Please call the care guide team at [phone] if you need to cancel or reschedule your appointment.  ? ?If you are experiencing a Mental Health or [place] or need someone to talk to, please  call the Suicide and Crisis Lifeline: 988 ?call the [place] National Suicide Prevention Lifeline: 613-265-7816 or TTY: (410) 498-9414 TTY 706-141-1067) to talk to a trained counselor ?call 1-800-273-TALK (toll free, 24 hour hotline) ?go to [facility] [address], [place] [phone]) ?call 911  ? ?Patient verbalizes understanding of instructions and care plan provided today and agrees to view in [place]. Active MyChart status confirmed with patient.   ?Peter Garter RN, BSN,CCM, CDE ?Care Management Coordinator ?[facility] ?[phone]   ?

## 2021-12-26 NOTE — Chronic Care Management (AMB) (Signed)
Chronic Care Management   CCM RN Visit Note  12/26/2021 Name: [name] MRN: [id number] DOB: 04-05-1951  Subjective: [name] is a 71 y.o. year old male who is a primary care patient of [name], MD. The care management team was consulted for assistance with disease management and care coordination needs.    Engaged with patient by telephone for follow up visit in response to provider referral for case management and/or care coordination services.   Consent to Services:  The patient was given information about Chronic Care Management services, agreed to services, and gave verbal consent prior to initiation of services.  Please see initial visit note for detailed documentation.   Patient agreed to services and verbal consent obtained.   Assessment: Review of patient past medical history, allergies, medications, health status, including review of consultants reports, laboratory and other test data, was performed as part of comprehensive evaluation and provision of chronic care management services.   SDOH (Social Determinants of Health) assessments and interventions performed:    CCM Care Plan  No Known Allergies  Outpatient Encounter Medications as of 12/26/2021  Medication Sig   atorvastatin (LIPITOR) 40 MG tablet TAKE 1 TABLET BY MOUTH EVERY DAY   blood glucose meter kit and supplies Dispense based on patient and insurance preference. Use to check glucose daily (FOR ICD-10 E10.9, E11.9).   cephALEXin (KEFLEX) 500 MG capsule Take 1 capsule (500 mg total) by mouth 3 (three) times daily.   Continuous Blood Gluc Receiver (DEXCOM G6 RECEIVER) DEVI 1 Device by Does not apply route as directed.   Continuous Blood Gluc Sensor (DEXCOM G6 SENSOR) MISC 1 Device by Does not apply route as directed.   Continuous Blood Gluc Transmit (DEXCOM G6 TRANSMITTER) MISC 1 Device by Does not apply route as directed.   diltiazem (MATZIM LA) 360 MG 24 hr tablet Take 1 tablet (360 mg total) by  mouth daily.   ferrous gluconate (FERGON) 324 MG tablet TAKE 1 TABLET BY MOUTH DAILY WITH BREAKFAST   insulin isophane & regular human (NOVOLIN 70/30 FLEXPEN) (70-30) 100 UNIT/ML KwikPen Inject 20 Units into the skin daily before breakfast AND 24 Units at bedtime.   Insulin Pen Needle (BD PEN NEEDLE NANO 2ND GEN) 32G X 4 MM MISC To use with insulin pen twice a day.   ketoconazole (NIZORAL) 2 % cream Apply to toes daily   Lancet Devices (ONE TOUCH DELICA LANCING DEV) MISC 1 Device by Does not apply route as directed.   latanoprost (XALATAN) 0.005 % ophthalmic solution SMARTSIG:In Eye(s)   losartan (COZAAR) 100 MG tablet TAKE 1 TABLET BY MOUTH EVERY DAY   omeprazole (PRILOSEC) 40 MG capsule TAKE 1 CAPSULE BY MOUTH EVERY DAY   ondansetron (ZOFRAN ODT) 4 MG disintegrating tablet 29m ODT q4 hours prn nausea/vomit   OneTouch Delica Lancets [id number] 1 Device by Does not apply route as directed. Use to test up to 4x daily.   ONETOUCH ULTRA test strip USE UP TO 4X DAILY   triamterene-hydrochlorothiazide (DYAZIDE) 37.5-25 MG capsule Take 1 each (1 capsule total) by mouth daily.   No facility-administered encounter medications on file as of 12/26/2021.    Patient Active Problem List   Diagnosis Date Noted   Delayed sleep phase syndrome 11/13/2020   [place] 09/16/2020   Severe nonproliferative diabetic retinopathy of right eye with macular edema associated with type 2 diabetes mellitus ([place] 08/28/2020   Age-related nuclear cataract, bilateral 08/28/2020   Hypertensive retinopathy of both eyes 08/28/2020  Bilateral carotid artery stenosis 08/28/2020   Murmur 08/28/2020   Diabetic macular edema ([place]) 07/13/2020   Mild nonproliferative diabetic retinopathy of right eye with macular edema associated with type 2 diabetes mellitus ([place]) 07/13/2020   BPH associated with nocturia 02/21/2020   Type 2 diabetes mellitus with diabetic polyneuropathy, without long-term current use of insulin ([place])  07/06/2019   Dyslipidemia 07/06/2019   ED (erectile dysfunction) 12/24/2018   Osteoarthritis 11/25/2018   Uncontrolled type 2 diabetes mellitus with hyperglycemia ([place]) 11/12/2018   Hyperlipidemia associated with type 2 diabetes mellitus ([place]) 11/12/2018   Essential hypertension 11/10/2018   Insomnia 11/10/2018   Diabetes ([place]) 01/23/2016   Depression (em[place]) 01/22/2016    Conditions to be addressed/monitored:HTN, HLD, and DMII  Care Plan : RN Care Manager Plan of Care  Updates made by [name], RN since 12/26/2021 12:00 AM     Problem: Chronic Disease Management and Care Coordination Needs (DM,HTN and HLD)   Priority: High     Long-Range Goal: Establish Plan of Care for Chronic Disease Management Needs (DM,HTN and HLD)   Start Date: 10/03/2021  Expected End Date: 04/01/2022  Priority: High  Note:   Current Barriers:  Chronic Disease Management support and education needs related to HTN, HLD, and DMII  Pt states that his CBGs have not been over 160 and he has not been checking every day  States he checks his CBG  if he feels it is too high or low.  Denies any hypoglycemia.  States he is trying to watch what he eat but sometimes likes something sweet.  States he tries to cook at home most of the time and does not uses salt. States he is very active working on houses and cars.  States his foot ulcer is dry now.  States he is waiting to hear from the the new podiatrist he was referred.  States he is checking is feet daily. States that  he is checking his B/P with the top numbers from 135-140. States he does not remember the bottom numbers.  States his readings have been better since his B/P medication was changed   RNCM Clinical Goal(s):  Patient will verbalize understanding of plan for management of HTN, HLD, and DMII as evidenced by voiced adherence to plan of care verbalize basic understanding of  HTN, HLD, and DMII disease process and self health management plan as evidenced  by voiced understanding and teach back take all medications exactly as prescribed and will call provider for medication related questions as evidenced by dispense report and pt verbalization attend all scheduled medical appointments: Dr. Volanda Napoleon 01/06/22 as evidenced by medical records demonstrate Improved adherence to prescribed treatment plan for HTN, HLD, and DMII as evidenced by readings within limits, voiced adherence to plan of care continue to work with RN Care Manager to address care management and care coordination needs related to  HTN, HLD, and DMII as evidenced by adherence to CM Team Scheduled appointments through collaboration with RN Care manager, provider, and care team.   Interventions: 1:1 collaboration with primary care provider regarding development and update of comprehensive plan of care as evidenced by provider attestation and co-signature Inter-disciplinary care team collaboration (see longitudinal plan of care) Evaluation of current treatment plan related to  self management and patient's adherence to plan as established by provider   Diabetes Interventions:  (Status:  Goal on track:  Yes.) Long Term Goal Assessed patient's understanding of A1c goal: <7% Provided education to patient about basic DM  disease process Reviewed medications with patient and discussed importance of medication adherence Counseled on importance of regular laboratory monitoring as prescribed Discussed plans with patient for ongoing care management follow up and provided patient with direct contact information for care management team Provided patient with written educational materials related to hypo and hyperglycemia and importance of correct treatment Reviewed scheduled/upcoming provider appointments including: Dr. Volanda Napoleon 01/06/22 Advised patient, providing education and rationale, to check cbg 1-2 times a day and record, calling provider for findings outside established parameters Given number to  podiatrist he was referred and encouraged to call if he does not hear from them. Instructed to take glucometer or CBG log to his next MD appointment. Reinforced foot care and discussed getting diabetic socks and shoes Lab Results  Component Value Date   HGBA1C 6.6 (A) 09/06/2021   Hyperlipidemia Interventions:  (Status:  Goal on track:  Yes.) Long Term Goal Medication review performed; medication list updated in electronic medical record.  Provider established cholesterol goals reviewed Counseled on importance of regular laboratory monitoring as prescribed Reviewed role and benefits of statin for ASCVD risk reduction Reviewed importance of limiting foods high in cholesterol Reviewed exercise goals and target of 150 minutes per week  Hypertension Interventions:  (Status:  Goal on track:  Yes.) Long Term Goal Last practice recorded BP readings:  BP Readings from Last 3 Encounters:  12/09/21 (!) 150/78  11/06/21 (!) 202/100  09/06/21 138/84  Most recent eGFR/CrCl: No results found for: EGFR  No components found for: CRCL  Evaluation of current treatment plan related to hypertension self management and patient's adherence to plan as established by provider Provided education to patient re: stroke prevention, s/s of heart attack and stroke Counseled on the importance of exercise goals with target of 150 minutes per week Discussed plans with patient for ongoing care management follow up and provided patient with direct contact information for care management team Advised patient, providing education and rationale, to monitor blood pressure daily and record, calling PCP for findings outside established parameters Discussed complications of poorly controlled blood pressure such as heart disease, stroke, circulatory complications, vision complications, kidney impairment, sexual dysfunction Reviewed to take B/P log to his next MD appointment.  Mailed [place] calendar to help him log  his readings.  Reviewed to avoid lunch meats with higher sodium  Patient Goals/Self-Care Activities: Take all medications as prescribed Attend all scheduled provider appointments Call pharmacy for medication refills 3-7 days in advance of running out of medications Call provider office for new concerns or questions  check blood sugar at prescribed times: twice daily and when you have symptoms of low or high blood sugar check feet daily for cuts, sores or redness enter blood sugar readings and medication or insulin into daily log take the blood sugar log to all doctor visits trim toenails straight across drink 6 to 8 glasses of water each day fill half of plate with vegetables manage portion size prepare main meal at home 3 to 5 days each week keep feet up while sitting wash and dry feet carefully every day wear comfortable, cotton socks wear comfortable, well-fitting shoes check blood pressure daily choose a place to take my blood pressure (home, clinic or office, retail store) write blood pressure results in a log or diary take blood pressure log to all doctor appointments call doctor for signs and symptoms of high blood pressure take medications for blood pressure exactly as prescribed begin an exercise program eat more whole grains, fruits  and vegetables, lean meats and healthy fats limit salt intake to 2339m/day call for medicine refill 2 or 3 days before it runs out take all medications exactly as prescribed call doctor with any symptoms you believe are related to your medicine  Follow Up Plan:  Telephone follow up appointment with care management team member scheduled for:  03/13/22 The patient has been provided with contact information for the care management team and has been advised to call with any health related questions or concerns.       Plan:Telephone follow up appointment with care management team member scheduled for:  03/13/22 The patient has been provided with  contact information for the care management team and has been advised to call with any health related questions or concerns.  MPeter GarterRN, BJackquline Denmark CDE Care Management Coordinator [facility] [phone]

## 2022-01-06 ENCOUNTER — Ambulatory Visit (INDEPENDENT_AMBULATORY_CARE_PROVIDER_SITE_OTHER): Payer: Medicare HMO | Admitting: Family Medicine

## 2022-01-06 ENCOUNTER — Encounter: Payer: Self-pay | Admitting: Family Medicine

## 2022-01-06 VITALS — BP 169/89 | HR 94 | Temp 97.8°F | Wt 151.6 lb

## 2022-01-06 DIAGNOSIS — L97519 Non-pressure chronic ulcer of other part of right foot with unspecified severity: Secondary | ICD-10-CM | POA: Diagnosis not present

## 2022-01-06 DIAGNOSIS — E1142 Type 2 diabetes mellitus with diabetic polyneuropathy: Secondary | ICD-10-CM

## 2022-01-06 DIAGNOSIS — I10 Essential (primary) hypertension: Secondary | ICD-10-CM | POA: Diagnosis not present

## 2022-01-06 LAB — POCT GLYCOSYLATED HEMOGLOBIN (HGB A1C): Hemoglobin A1C: 7.7 % — AB (ref 4.0–5.6)

## 2022-01-06 MED ORDER — TRIAMTERENE-HCTZ 37.5-25 MG PO CAPS: 1.0000 | ORAL_CAPSULE | Freq: Every day | ORAL | 3 refills | Status: DC

## 2022-01-06 NOTE — Patient Instructions (Addendum)
It looks like a new referral to [facility] was approved last month.  Their office number is [phone].  You can contact them in regards to scheduling follow-up. ? ? ?

## 2022-01-06 NOTE — Progress Notes (Signed)
Subjective:  ? ? Patient ID: [name], male    DOB: September 21, 1951, 71 y.o.   MRN: [id number] ? ?Chief Complaint  ?Patient presents with  ? Follow-up  ?  BP  ? ? ?HPI ?Patient was seen today for f/u on HTN and DM.  Patient states BP elevated this visit as he was rushing/almost missed his appointment.  BP at home 115/69, 120/69, 130/69, 117/68, 138/71, 120/67, 132/74, 120/67 with pulse 65, 67, 69, 74, 92.  Patient states he has been feeling better overall since BP med adjusted.  No longer feeling sluggish/decreased energy.  Blood sugar has been 125, 120, 156, 146, 135, 120, 145, 130.  Pt was being followed by podiatry.  Expresses frustration after seeing a different provider each visit.  States was advised to obtain various boots and creams despite already having them.  Right foot ulcer is healing without pain or drainage. ? ?Past Medical History:  ?Diagnosis Date  ? Diabetes mellitus without complication ([place])   ? Insomnia   ? Prostate cancer ([place])   ? ? ?No Known Allergies ? ?ROS ?General: Denies fever, chills, night sweats, changes in weight, changes in appetite ?HEENT: Denies headaches, ear pain, changes in vision, rhinorrhea, sore throat ?CV: Denies CP, palpitations, SOB, orthopnea ?Pulm: Denies SOB, cough, wheezing ?GI: Denies abdominal pain, nausea, vomiting, diarrhea, constipation ?GU: Denies dysuria, hematuria, frequency ?Msk: Denies muscle cramps, joint pains ?Neuro: Denies weakness, numbness, tingling ?Skin: Denies rashes, bruising + ulcers on right foot ?Psych: Denies depression, anxiety, hallucinations ? ?   ?Objective:  ?  ?Blood pressure (!) 179/108, pulse 94, temperature 97.8 ?F (36.6 ?C), temperature source Oral, weight 151 lb 9.6 oz (68.8 kg), SpO2 99 %. ? ?Gen. Pleasant, well-nourished, in no distress, normal affect   ?HEENT: [place]/AT, face symmetric, conjunctiva clear, no scleral icterus, PERRLA, EOMI, nares patent without drainage, pharynx without erythema or exudate. ?Neck: No JVD, no thyromegaly,  no carotid bruits ?Lungs: no accessory muscle use, CTAB, no wheezes or rales ?Cardiovascular: RRR, no m/r/g, no peripheral edema ?Musculoskeletal: No deformities, no cyanosis or clubbing, normal tone ?Neuro:  A&Ox3, CN II-XII intact, normal gait ?Skin:  Warm, dry, intact.  R foot with healing ulcers of toes, eschar in place, firm, calloused, dry skin, no drainage, or erythema noted.   ? ? ? ? ? ?Wt Readings from Last 3 Encounters:  ?01/06/22 151 lb 9.6 oz (68.8 kg)  ?12/09/21 158 lb (71.7 kg)  ?11/06/21 160 lb 12.8 oz (72.9 kg)  ? ? ?Lab Results  ?Component Value Date  ? WBC 3.6 (L) 03/20/2021  ? HGB 10.2 (L) 03/20/2021  ? HCT 28.4 (L) 03/20/2021  ? PLT 228.0 03/20/2021  ? GLUCOSE 86 03/20/2021  ? CHOL 137 09/07/2020  ? TRIG 56 09/07/2020  ? HDL 53 09/07/2020  ? [place] 70 09/07/2020  ? ALT 15 03/20/2021  ? AST 17 03/20/2021  ? NA 142 03/20/2021  ? K 4.1 03/20/2021  ? CL 105 03/20/2021  ? CREATININE 1.34 03/20/2021  ? BUN 27 (H) 03/20/2021  ? CO2 31 03/20/2021  ? TSH 1.01 09/07/2020  ? PSA 6.49 (H) 03/21/2020  ? HGBA1C 6.6 (A) 09/06/2021  ? MICROALBUR 13.6 (H) 11/10/2018  ? ? ?Assessment/Plan: ? ?Essential hypertension ?-Uncontrolled in clinic, but controlled at home ?-Repeat BP ?-Given patient's readings at home we will wait to adjust medications. ?-Continue current medications including diltiazem 360 mg daily, triamterene-hydrochlorothiazide 37.5-25 mg daily, losartan 100 mg ?-Continue lifestyle modifications ? ?Type 2 diabetes mellitus with diabetic polyneuropathy, without  long-term current use of insulin ([place])  ?-hgb A1C 6.6% on 09/06/2021 ?-Continue Novolin 70/30 20 units in a.m. and 24 units in evening. ?-Check Hemoglobin A1c this visit ?-Continue follow-up with podiatry ?-Continue ARB and statin ?-Continue follow-up with ophthalmology ?- Plan: POC HgB A1c ? ?Ulcer of right foot, unspecified ulcer stage ([place]) ?-Healing ?-Encouraged to wear diabetic shoes ?-Patient encouraged to continue follow-up with podiatry  for monitoring ?-New referral was placed.  Patient to contact clinic in regards to scheduling appointment. ? ?F/u in 3-4 months ? ?Grier Mitts, MD ?

## 2022-01-20 ENCOUNTER — Other Ambulatory Visit: Payer: Self-pay

## 2022-01-20 ENCOUNTER — Ambulatory Visit (INDEPENDENT_AMBULATORY_CARE_PROVIDER_SITE_OTHER): Payer: Medicare HMO | Admitting: Podiatry

## 2022-01-20 DIAGNOSIS — M2042 Other hammer toe(s) (acquired), left foot: Secondary | ICD-10-CM

## 2022-01-20 DIAGNOSIS — Z8631 Personal history of diabetic foot ulcer: Secondary | ICD-10-CM | POA: Diagnosis not present

## 2022-01-20 DIAGNOSIS — E1165 Type 2 diabetes mellitus with hyperglycemia: Secondary | ICD-10-CM

## 2022-01-20 DIAGNOSIS — M2041 Other hammer toe(s) (acquired), right foot: Secondary | ICD-10-CM | POA: Diagnosis not present

## 2022-01-20 DIAGNOSIS — L97511 Non-pressure chronic ulcer of other part of right foot limited to breakdown of skin: Secondary | ICD-10-CM

## 2022-01-20 NOTE — Progress Notes (Signed)
?  Subjective:  ?Patient ID: [name], male    DOB: Aug 25, 1951,  MRN: [id number] ? ?Chief Complaint  ?Patient presents with  ? Foot Ulcer  ?   right foot great toe blister-  ? ? ?71 y.o. male presents with the above complaint. History confirmed with patient.  He has been applying the antibiotic ointment feels he is improving ? ?Objective:  ?Physical Exam: ?warm, good capillary refill, no trophic changes or ulcerative lesions, normal DP and PT pulses, and loss of protective sensation in the toes, there are partial-thickness skin breakdown ulcers that are healing well no signs of infection. ? ? ? ? ?Assessment:  ? ?1. Skin ulcer of right great toe, limited to breakdown of skin ([place])   ?2. Personal history of diabetic foot ulcer   ?3. Uncontrolled type 2 diabetes mellitus with hyperglycemia ([place])   ?4. Hammertoe of left foot   ?5. Hammertoe of right foot   ? ? ? ?Plan:  ?Patient was evaluated and treated and all questions answered. ? ?Overall seems to be improving I recommend he continues in the antibiotic ointment until these are fully healed.  I think for prevention of recurrence with his severe hammertoe deformities and peripheral neuropathy he would benefit greatly from a extra-depth diabetic shoe with accommodative multidensity soft insole.  I will have him scheduled to see our orthotist for this.  I will see him back in 4 weeks for reevaluation ? ? ?Return in about 4 weeks (around 02/17/2022) for wound check toes.  ? ?

## 2022-01-24 DIAGNOSIS — E1169 Type 2 diabetes mellitus with other specified complication: Secondary | ICD-10-CM

## 2022-01-24 DIAGNOSIS — Z7984 Long term (current) use of oral hypoglycemic drugs: Secondary | ICD-10-CM | POA: Diagnosis not present

## 2022-01-24 DIAGNOSIS — E785 Hyperlipidemia, unspecified: Secondary | ICD-10-CM

## 2022-01-24 DIAGNOSIS — I10 Essential (primary) hypertension: Secondary | ICD-10-CM | POA: Diagnosis not present

## 2022-02-17 ENCOUNTER — Ambulatory Visit: Payer: Medicare HMO

## 2022-02-17 ENCOUNTER — Ambulatory Visit (INDEPENDENT_AMBULATORY_CARE_PROVIDER_SITE_OTHER): Payer: Medicare HMO | Admitting: Podiatry

## 2022-02-17 DIAGNOSIS — E1165 Type 2 diabetes mellitus with hyperglycemia: Secondary | ICD-10-CM

## 2022-02-17 DIAGNOSIS — Z8631 Personal history of diabetic foot ulcer: Secondary | ICD-10-CM

## 2022-02-17 DIAGNOSIS — M2041 Other hammer toe(s) (acquired), right foot: Secondary | ICD-10-CM

## 2022-02-17 DIAGNOSIS — L97511 Non-pressure chronic ulcer of other part of right foot limited to breakdown of skin: Secondary | ICD-10-CM

## 2022-02-17 DIAGNOSIS — M2042 Other hammer toe(s) (acquired), left foot: Secondary | ICD-10-CM

## 2022-02-17 NOTE — Progress Notes (Signed)
?  Subjective:  ?Patient ID: [name], male    DOB: [birth year],  MRN: [id number] ? ?Chief Complaint  ?Patient presents with  ? Foot Ulcer  ?  Follow up right great toe  ? ? ?71 y.o. male presents with the above complaint. History confirmed with patient.  Feels he is doing better ? ?Objective:  ?Physical Exam: ?warm, good capillary refill, no trophic changes or ulcerative lesions, normal DP and PT pulses, and loss of protective sensation in the toes, ulcerations have healed there are new serous blisters on the fourth and fifth toes ? ? ? ? ?Assessment:  ? ?1. Skin ulcer of right great toe, limited to breakdown of skin ([place])   ?2. Uncontrolled type 2 diabetes mellitus with hyperglycemia ([place])   ? ? ? ?Plan:  ?Patient was evaluated and treated and all questions answered. ? ?Doing well we again discussed the possibility of recurrence and the new blisters to form today.  I drained them after sterile prep with Betadine and the should resolve uneventfully there is no signs of infection or ulceration here.  I again emphasized importance of strict icing and control as well as accommodative shoes and he was seen by the orthotist today to be fitted for these.  I will see him back as needed if there is recurrence. ? ? ?Return if symptoms worsen or fail to improve.  ? ?

## 2022-02-17 NOTE — Progress Notes (Signed)
SITUATION ?Reason for Consult: Evaluation for Prefabricated Diabetic Shoes and Custom Diabetic Inserts. ?Patient / Caregiver Report: Patient would like well fitting shoes ? ?OBJECTIVE DATA: ?Patient History / Diagnosis:  ?  ICD-10-CM   ?1. Uncontrolled type 2 diabetes mellitus with hyperglycemia (HCC)  E11.65   ?  ?2. Hammertoe of left foot  M20.42   ?  ?3. Hammertoe of right foot  M20.41   ?  ?4. Personal history of diabetic foot ulcer  Z86.31   ?  ? ? ?Physician Treating Diabetes:  [name], MD ? ?Current or Previous Devices:   None and no history ? ?In-Person Foot Examination: ?Ulcers & Callousing:   Historical ?Deformities:    Hammertoes ?Sensation:    Compromised  ?Shoe Size:     9XW ? ?ORTHOTIC RECOMMENDATION ?Recommended Devices: ?- 1x pair prefabricated PDAC approved diabetic shoes; Patient Selected Orthofeet [employer] Black 488 Size 9XW ?- 3x pair custom-to-patient PDAC approved vacuum formed diabetic insoles. ? ?GOALS OF SHOES AND INSOLES ?- Reduce shear and pressure ?- Reduce / Prevent callus formation ?- Reduce / Prevent ulceration ?- Protect the fragile healing compromised diabetic foot. ? ?Patient would benefit from diabetic shoes and inserts as patient has diabetes mellitus and the patient has one or more of the following conditions: ?- History of partial or complete amputation of the foot ?- History of previous foot ulceration. ?- History of pre-ulcerative callus ?- Peripheral neuropathy with evidence of callus formation ?- Foot deformity ?- Poor circulation ? ?ACTIONS PERFORMED ?Potential out of pocket cost was communicated to patient. Patient understood and consented to measurement and casting. Patient was casted for insoles via crush box and measured for shoes via brannock device. Procedure was explained and patient tolerated procedure well. All questions were answered and concerns addressed. Casts were shipped to central fabrication for HOLD until Certificate of Medical Necessity or otherwise  necessary authorization from insurance is obtained. ? ?PLAN ?Shoes are to be ordered and casts released from hold once all appropriate paperwork is complete. Patient is to be contacted and scheduled for fitting once shoes and insoles have been fabricated and received. ? ?

## 2022-03-04 ENCOUNTER — Telehealth: Payer: Self-pay

## 2022-03-04 NOTE — Telephone Encounter (Signed)
Documents received - shoes ordered - Orth[employer]feet [employer] 488 Black 9XW ?

## 2022-03-13 ENCOUNTER — Ambulatory Visit (INDEPENDENT_AMBULATORY_CARE_PROVIDER_SITE_OTHER): Payer: Medicare HMO

## 2022-03-13 DIAGNOSIS — E1142 Type 2 diabetes mellitus with diabetic polyneuropathy: Secondary | ICD-10-CM

## 2022-03-13 DIAGNOSIS — I10 Essential (primary) hypertension: Secondary | ICD-10-CM

## 2022-03-13 DIAGNOSIS — E1169 Type 2 diabetes mellitus with other specified complication: Secondary | ICD-10-CM

## 2022-03-13 DIAGNOSIS — E11621 Type 2 diabetes mellitus with foot ulcer: Secondary | ICD-10-CM

## 2022-03-13 NOTE — Patient Instructions (Signed)
Visit Information  Thank you for taking time to visit with me today. Please don't hesitate to contact me if I can be of assistance to you before our next scheduled telephone appointment.  Following are the goals we discussed today:  Take all medications as prescribed Attend all scheduled provider appointments Call pharmacy for medication refills 3-7 days in advance of running out of medications Call provider office for new concerns or questions  check blood sugar at prescribed times: twice daily and when you have symptoms of low or high blood sugar check feet daily for cuts, sores or redness enter blood sugar readings and medication or insulin into daily log take the blood sugar log to all doctor visits trim toenails straight across drink 6 to 8 glasses of water each day fill half of plate with vegetables manage portion size prepare main meal at home 3 to 5 days each week keep feet up while sitting wash and dry feet carefully every day wear comfortable, cotton socks wear comfortable, well-fitting shoes check blood pressure daily choose a place to take my blood pressure (home, clinic or office, retail store) write blood pressure results in a log or diary take blood pressure log to all doctor appointments call doctor for signs and symptoms of high blood pressure take medications for blood pressure exactly as prescribed begin an exercise program eat more whole grains, fruits and vegetables, lean meats and healthy fats limit salt intake to '2300mg'$ /day call for medicine refill 2 or 3 days before it runs out take all medications exactly as prescribed call doctor with any symptoms you believe are related to your medicine  Our next appointment is by telephone on 05/13/22 at 2:15 PM  Please call the care guide team at [phone] if you need to cancel or reschedule your appointment.   If you are experiencing a Mental Health or [place] or need someone to talk to, please  call the Suicide and Crisis Lifeline: 988 call the [place] National Suicide Prevention Lifeline: 3853320422 or TTY: 307-567-6513 TTY 430-513-1898) to talk to a trained counselor call 1-800-273-TALK (toll free, 24 hour hotline) go to [facility] [address] [place] [id number]) call 911   Patient verbalizes understanding of instructions and care plan provided today and agrees to view in [place]. Active MyChart status and patient understanding of how to access instructions and care plan via MyChart confirmed with patient.     Peter Garter RN, Jackquline Denmark, CDE Care Management Coordinator [facility] [phone]

## 2022-03-13 NOTE — Chronic Care Management (AMB) (Signed)
Chronic Care Management   CCM RN Visit Note  03/13/2022 Name: [name] MRN: [id number] DOB: October 20, 1951  Subjective: [name] is a 71 y.o. year old male who is a primary care patient of [name], MD. The care management team was consulted for assistance with disease management and care coordination needs.    Engaged with patient by telephone for follow up visit in response to provider referral for case management and/or care coordination services.   Consent to Services:  The patient was given information about Chronic Care Management services, agreed to services, and gave verbal consent prior to initiation of services.  Please see initial visit note for detailed documentation.   Patient agreed to services and verbal consent obtained.   Assessment: Review of patient past medical history, allergies, medications, health status, including review of consultants reports, laboratory and other test data, was performed as part of comprehensive evaluation and provision of chronic care management services.   SDOH (Social Determinants of Health) assessments and interventions performed:    CCM Care Plan  No Known Allergies  Outpatient Encounter Medications as of 03/13/2022  Medication Sig   atorvastatin (LIPITOR) 40 MG tablet TAKE 1 TABLET BY MOUTH EVERY DAY   blood glucose meter kit and supplies Dispense based on patient and insurance preference. Use to check glucose daily (FOR ICD-10 E10.9, E11.9).   cephALEXin (KEFLEX) 500 MG capsule Take 1 capsule (500 mg total) by mouth 3 (three) times daily.   Continuous Blood Gluc Receiver (DEXCOM G6 RECEIVER) DEVI 1 Device by Does not apply route as directed.   Continuous Blood Gluc Sensor (DEXCOM G6 SENSOR) MISC 1 Device by Does not apply route as directed.   Continuous Blood Gluc Transmit (DEXCOM G6 TRANSMITTER) MISC 1 Device by Does not apply route as directed.   diltiazem (MATZIM LA) 360 MG 24 hr tablet Take 1 tablet (360 mg total)  by mouth daily.   ferrous gluconate (FERGON) 324 MG tablet TAKE 1 TABLET BY MOUTH DAILY WITH BREAKFAST   insulin isophane & regular human (NOVOLIN 70/30 FLEXPEN) (70-30) 100 UNIT/ML KwikPen Inject 20 Units into the skin daily before breakfast AND 24 Units at bedtime.   Insulin Pen Needle (BD PEN NEEDLE NANO 2ND GEN) 32G X 4 MM MISC To use with insulin pen twice a day.   ketoconazole (NIZORAL) 2 % cream Apply to toes daily   Lancet Devices (ONE TOUCH DELICA LANCING DEV) MISC 1 Device by Does not apply route as directed.   latanoprost (XALATAN) 0.005 % ophthalmic solution SMARTSIG:In Eye(s)   losartan (COZAAR) 100 MG tablet TAKE 1 TABLET BY MOUTH EVERY DAY   omeprazole (PRILOSEC) 40 MG capsule TAKE 1 CAPSULE BY MOUTH EVERY DAY   ondansetron (ZOFRAN ODT) 4 MG disintegrating tablet 65m ODT q4 hours prn nausea/vomit   OneTouch Delica Lancets 378IMISC 1 Device by Does not apply route as directed. Use to test up to 4x daily.   ONETOUCH ULTRA test strip USE UP TO 4X DAILY   triamterene-hydrochlorothiazide (DYAZIDE) 37.5-25 MG capsule Take 1 each (1 capsule total) by mouth daily.   No facility-administered encounter medications on file as of 03/13/2022.    Patient Active Problem List   Diagnosis Date Noted   Delayed sleep phase syndrome 11/13/2020   Prostate cancer ([place] 09/16/2020   Severe nonproliferative diabetic retinopathy of right eye with macular edema associated with type 2 diabetes mellitus ([place] 08/28/2020   Age-related nuclear cataract, bilateral 08/28/2020   Hypertensive retinopathy of both eyes 08/28/2020  Bilateral carotid artery stenosis 08/28/2020   Murmur 08/28/2020   Diabetic macular edema ([place]) 07/13/2020   Mild nonproliferative diabetic retinopathy of right eye with macular edema associated with type 2 diabetes mellitus ([place]) 07/13/2020   BPH associated with nocturia 02/21/2020   Type 2 diabetes mellitus with diabetic polyneuropathy, without long-term current use of insulin  ([place]) 07/06/2019   Dyslipidemia 07/06/2019   ED (erectile dysfunction) 12/24/2018   Osteoarthritis 11/25/2018   Uncontrolled type 2 diabetes mellitus with hyperglycemia ([employer]) 11/12/2018   Hyperlipidemia associated with type 2 diabetes mellitus ([place]) 11/12/2018   Essential hypertension 11/10/2018   Insomnia 11/10/2018   Diabetes ([place]) 01/23/2016   Depression (em[place]) 01/22/2016    Conditions to be addressed/monitored:HTN, HLD, and DMII  Care Plan : RN Care Manager Plan of Care  Updates made by [name], RN since 03/13/2022 12:00 AM     Problem: Chronic Disease Management and Care Coordination Needs (DM,HTN and HLD)   Priority: High     Long-Range Goal: Establish Plan of Care for Chronic Disease Management Needs (DM,HTN and HLD)   Start Date: 10/03/2021  Expected End Date: 03/13/2023  Priority: High  Note:   Current Barriers:  Chronic Disease Management support and education needs related to HTN, HLD, and DMII  Pt states that his CBGs have been in the 130's in the morning and can be up to 222 in the afternoon.  Denies any hypoglycemia.  States he is trying to watch what he eat but sometimes likes something sweet.  States he tries to cook at home most of the time and does not uses salt. States he is very active working on houses and cars.  States his foot ulcer is healed now. STates they have ordered him diabetic shoes and he is waiting for them to be delivered.   States he is checking is feet daily. States that  he is checking his B/P with the top numbers from 122 and 124. States he does not remember the bottom numbers.    RNCM Clinical Goal(s):  Patient will verbalize understanding of plan for management of HTN, HLD, and DMII as evidenced by voiced adherence to plan of care verbalize basic understanding of  HTN, HLD, and DMII disease process and self health management plan as evidenced by voiced understanding and teach back take all medications exactly as prescribed and will  call provider for medication related questions as evidenced by dispense report and pt verbalization attend all scheduled medical appointments: Dr. Volanda Napoleon 04/10/22 as evidenced by medical records demonstrate Improved adherence to prescribed treatment plan for HTN, HLD, and DMII as evidenced by readings within limits, voiced adherence to plan of care continue to work with RN Care Manager to address care management and care coordination needs related to  HTN, HLD, and DMII as evidenced by adherence to CM Team Scheduled appointments through collaboration with RN Care manager, provider, and care team.   Interventions: 1:1 collaboration with primary care provider regarding development and update of comprehensive plan of care as evidenced by provider attestation and co-signature Inter-disciplinary care team collaboration (see longitudinal plan of care) Evaluation of current treatment plan related to  self management and patient's adherence to plan as established by provider   Diabetes Interventions:  (Status:  Goal on track:  Yes.) Long Term Goal Assessed patient's understanding of A1c goal: <7% Provided education to patient about basic DM disease process Reviewed medications with patient and discussed importance of medication adherence Counseled on importance of regular laboratory monitoring as  prescribed Discussed plans with patient for ongoing care management follow up and provided patient with direct contact information for care management team Provided patient with written educational materials related to hypo and hyperglycemia and importance of correct treatment Reviewed scheduled/upcoming provider appointments including: Dr. Volanda Napoleon 04/10/22 Advised patient, providing education and rationale, to check cbg 1-2 times a day and record, calling provider for findings outside established parameters  Reviewed fasting blood sugar goals of 80-130 and less than 180 1 1/2-2 hours after meals. Reinforced foot  care and discussed getting diabetic socks and shoes Lab Results  Component Value Date   HGBA1C 7.7 (A) 01/06/2022   Hyperlipidemia Interventions:  (Status:  Goal on track:  Yes.) Long Term Goal Medication review performed; medication list updated in electronic medical record.  Provider established cholesterol goals reviewed Counseled on importance of regular laboratory monitoring as prescribed Reviewed role and benefits of statin for ASCVD risk reduction Reviewed importance of limiting foods high in cholesterol Reviewed exercise goals and target of 150 minutes per week Reviewed to avoid fried foods  Hypertension Interventions:  (Status:  Goal on track:  Yes.) Long Term Goal Last practice recorded BP readings:  BP Readings from Last 3 Encounters:  01/06/22 (!) 169/89  12/09/21 (!) 150/78  11/06/21 (!) 202/100  Most recent eGFR/CrCl: No results found for: EGFR  No components found for: CRCL  Evaluation of current treatment plan related to hypertension self management and patient's adherence to plan as established by provider Provided education to patient re: stroke prevention, s/s of heart attack and stroke Counseled on the importance of exercise goals with target of 150 minutes per week Discussed plans with patient for ongoing care management follow up and provided patient with direct contact information for care management team Advised patient, providing education and rationale, to monitor blood pressure daily and record, calling PCP for findings outside established parameters Discussed complications of poorly controlled blood pressure such as heart disease, stroke, circulatory complications, vision complications, kidney impairment, sexual dysfunction Reinforced to take B/P log to his next MD appointment. Reinforced to avoid lunch meats with higher sodium  Patient Goals/Self-Care Activities: Take all medications as prescribed Attend all scheduled provider appointments Call pharmacy  for medication refills 3-7 days in advance of running out of medications Call provider office for new concerns or questions  check blood sugar at prescribed times: twice daily and when you have symptoms of low or high blood sugar check feet daily for cuts, sores or redness enter blood sugar readings and medication or insulin into daily log take the blood sugar log to all doctor visits trim toenails straight across drink 6 to 8 glasses of water each day fill half of plate with vegetables manage portion size prepare main meal at home 3 to 5 days each week keep feet up while sitting wash and dry feet carefully every day wear comfortable, cotton socks wear comfortable, well-fitting shoes check blood pressure daily choose a place to take my blood pressure (home, clinic or office, retail store) write blood pressure results in a log or diary take blood pressure log to all doctor appointments call doctor for signs and symptoms of high blood pressure take medications for blood pressure exactly as prescribed begin an exercise program eat more whole grains, fruits and vegetables, lean meats and healthy fats limit salt intake to 2324m/day call for medicine refill 2 or 3 days before it runs out take all medications exactly as prescribed call doctor with any symptoms you believe are related to  your medicine  Follow Up Plan:  Telephone follow up appointment with care management team member scheduled for:  05/13/22 The patient has been provided with contact information for the care management team and has been advised to call with any health related questions or concerns.       Plan:Telephone follow up appointment with care management team member scheduled for:  05/13/22 The patient has been provided with contact information for the care management team and has been advised to call with any health related questions or concerns.  Peter Garter RN, Jackquline Denmark, CDE Care Management Coordinator [facility] [phone]

## 2022-03-25 DIAGNOSIS — E1140 Type 2 diabetes mellitus with diabetic neuropathy, unspecified: Secondary | ICD-10-CM | POA: Diagnosis not present

## 2022-03-25 DIAGNOSIS — Z8546 Personal history of malignant neoplasm of prostate: Secondary | ICD-10-CM | POA: Diagnosis not present

## 2022-03-25 DIAGNOSIS — I10 Essential (primary) hypertension: Secondary | ICD-10-CM | POA: Diagnosis not present

## 2022-03-25 DIAGNOSIS — N529 Male erectile dysfunction, unspecified: Secondary | ICD-10-CM | POA: Diagnosis not present

## 2022-03-25 DIAGNOSIS — Z008 Encounter for other general examination: Secondary | ICD-10-CM | POA: Diagnosis not present

## 2022-03-25 DIAGNOSIS — M1990 Unspecified osteoarthritis, unspecified site: Secondary | ICD-10-CM | POA: Diagnosis not present

## 2022-03-25 DIAGNOSIS — Z794 Long term (current) use of insulin: Secondary | ICD-10-CM | POA: Diagnosis not present

## 2022-03-25 DIAGNOSIS — E785 Hyperlipidemia, unspecified: Secondary | ICD-10-CM | POA: Diagnosis not present

## 2022-03-25 DIAGNOSIS — E876 Hypokalemia: Secondary | ICD-10-CM | POA: Diagnosis not present

## 2022-03-26 DIAGNOSIS — E1169 Type 2 diabetes mellitus with other specified complication: Secondary | ICD-10-CM | POA: Diagnosis not present

## 2022-03-26 DIAGNOSIS — I10 Essential (primary) hypertension: Secondary | ICD-10-CM | POA: Diagnosis not present

## 2022-03-26 DIAGNOSIS — Z794 Long term (current) use of insulin: Secondary | ICD-10-CM | POA: Diagnosis not present

## 2022-03-26 DIAGNOSIS — E785 Hyperlipidemia, unspecified: Secondary | ICD-10-CM | POA: Diagnosis not present

## 2022-04-02 ENCOUNTER — Ambulatory Visit (INDEPENDENT_AMBULATORY_CARE_PROVIDER_SITE_OTHER): Payer: Medicare HMO

## 2022-04-02 DIAGNOSIS — M2042 Other hammer toe(s) (acquired), left foot: Secondary | ICD-10-CM | POA: Diagnosis not present

## 2022-04-02 DIAGNOSIS — M2041 Other hammer toe(s) (acquired), right foot: Secondary | ICD-10-CM | POA: Diagnosis not present

## 2022-04-02 DIAGNOSIS — E1165 Type 2 diabetes mellitus with hyperglycemia: Secondary | ICD-10-CM | POA: Diagnosis not present

## 2022-04-02 NOTE — Progress Notes (Signed)
SITUATION Reason for Visit: Fitting of Diabetic Shoes & Insoles Patient / Caregiver Report:  Patient is satisfied with fit and function of shoes and insoles.  OBJECTIVE DATA: Patient History / Diagnosis:     ICD-10-CM   1. Uncontrolled type 2 diabetes mellitus with hyperglycemia (HCC)  E11.65     2. Hammertoe of left foot  M20.42     3. Hammertoe of right foot  M20.41       Change in Status:   None  ACTIONS PERFORMED: In-Person Delivery, patient was fit with: - 1x pair [id number] PDAC approved prefabricated Diabetic Shoes: Orthofeet 448 boots 9XW - 3x pair [id number] PDAC approved vacuum formed custom diabetic insoles; [employer]: [id number]  Shoes and insoles were verified for structural integrity and safety. Patient wore shoes and insoles in office. Skin was inspected and free of areas of concern after wearing shoes and inserts. Shoes and inserts fit properly. Patient / Caregiver provided with ferbal instruction and demonstration regarding donning, doffing, wear, care, proper fit, function, purpose, cleaning, and use of shoes and insoles ' and in all related precautions and risks and benefits regarding shoes and insoles. Patient / Caregiver was instructed to wear properly fitting socks with shoes at all times. Patient was also provided with verbal instruction regarding how to report any failures or malfunctions of shoes or inserts, and necessary follow up care. Patient / Caregiver was also instructed to contact physician regarding change in status that may affect function of shoes and inserts.   Patient / Caregiver verbalized undersatnding of instruction provided. Patient / Caregiver demonstrated independence with proper donning and doffing of shoes and inserts.  PLAN Patient to follow with treating physician as recommended. Plan of care was discussed with and agreed upon by patient and/or caregiver. All questions were answered and concerns addressed.

## 2022-04-10 ENCOUNTER — Ambulatory Visit (INDEPENDENT_AMBULATORY_CARE_PROVIDER_SITE_OTHER): Payer: Medicare HMO | Admitting: Family Medicine

## 2022-04-10 ENCOUNTER — Encounter: Payer: Self-pay | Admitting: Family Medicine

## 2022-04-10 VITALS — BP 200/100 | HR 90 | Temp 98.8°F | Ht 66.0 in | Wt 152.8 lb

## 2022-04-10 DIAGNOSIS — E113411 Type 2 diabetes mellitus with severe nonproliferative diabetic retinopathy with macular edema, right eye: Secondary | ICD-10-CM | POA: Diagnosis not present

## 2022-04-10 DIAGNOSIS — H35033 Hypertensive retinopathy, bilateral: Secondary | ICD-10-CM

## 2022-04-10 DIAGNOSIS — E1142 Type 2 diabetes mellitus with diabetic polyneuropathy: Secondary | ICD-10-CM | POA: Diagnosis not present

## 2022-04-10 DIAGNOSIS — G4700 Insomnia, unspecified: Secondary | ICD-10-CM | POA: Diagnosis not present

## 2022-04-10 DIAGNOSIS — I160 Hypertensive urgency: Secondary | ICD-10-CM

## 2022-04-10 DIAGNOSIS — Z794 Long term (current) use of insulin: Secondary | ICD-10-CM

## 2022-04-10 LAB — URINALYSIS, ROUTINE W REFLEX MICROSCOPIC
Bilirubin Urine: NEGATIVE
Ketones, ur: NEGATIVE
Leukocytes,Ua: NEGATIVE
Nitrite: NEGATIVE
Specific Gravity, Urine: 1.025 (ref 1.000–1.030)
Total Protein, Urine: 100 — AB
Urine Glucose: NEGATIVE
Urobilinogen, UA: 0.2 (ref 0.0–1.0)
pH: 5.5 (ref 5.0–8.0)

## 2022-04-10 LAB — LIPID PANEL
Cholesterol: 179 mg/dL (ref 0–200)
HDL: 53.7 mg/dL (ref >39.00)
LDL Cholesterol: 111 mg/dL — ABNORMAL HIGH (ref 0–99)
NonHDL: 124.99
Total CHOL/HDL Ratio: 3
Triglycerides: 68 mg/dL (ref 0.0–149.0)
VLDL: 13.6 mg/dL (ref 0.0–40.0)

## 2022-04-10 LAB — COMPREHENSIVE METABOLIC PANEL
ALT: 22 U/L (ref 0–53)
AST: 27 U/L (ref 0–37)
Albumin: 3.7 g/dL (ref 3.5–5.2)
Alkaline Phosphatase: 77 U/L (ref 39–117)
BUN: 36 mg/dL — ABNORMAL HIGH (ref 6–23)
CO2: 26 mEq/L (ref 19–32)
Calcium: 9.4 mg/dL (ref 8.4–10.5)
Chloride: 109 mEq/L (ref 96–112)
Creatinine, Ser: 1.74 mg/dL — ABNORMAL HIGH (ref 0.40–1.50)
GFR: 39.42 mL/min — ABNORMAL LOW (ref >60.00)
Glucose, Bld: 158 mg/dL — ABNORMAL HIGH (ref 70–99)
Potassium: 4.9 mEq/L (ref 3.5–5.1)
Sodium: 141 mEq/L (ref 135–145)
Total Bilirubin: 0.4 mg/dL (ref 0.2–1.2)
Total Protein: 6.8 g/dL (ref 6.0–8.3)

## 2022-04-10 LAB — MICROALBUMIN / CREATININE URINE RATIO
Creatinine,U: 84.6 mg/dL
Microalb Creat Ratio: 134.3 mg/g — ABNORMAL HIGH (ref 0.0–30.0)
Microalb, Ur: 113.6 mg/dL — ABNORMAL HIGH (ref 0.0–1.9)

## 2022-04-10 LAB — T4, FREE: Free T4: 0.69 ng/dL (ref 0.60–1.60)

## 2022-04-10 LAB — TSH: TSH: 1.35 u[IU]/mL (ref 0.35–5.50)

## 2022-04-10 MED ORDER — TRAZODONE HCL 50 MG PO TABS: 25.0000 mg | ORAL_TABLET | Freq: Every evening | ORAL | 3 refills | Status: DC | PRN

## 2022-04-10 MED ORDER — HYDRALAZINE HCL 10 MG PO TABS: 10.0000 mg | ORAL_TABLET | Freq: Three times a day (TID) | ORAL | 3 refills | Status: DC

## 2022-04-10 NOTE — Patient Instructions (Addendum)
A prescription for hydralazine 10 mg was sent to your pharmacy.  This is a blood pressure medicine that you are to take 3 times a day along with your other blood pressure medications.  Continue checking the blood pressure at home and keeping a log to bring with you to your next appointment in 2-3 weeks.  Another prescription for medication called trazodone was sent to your pharmacy.  This is to help with sleep.  You can take a half a tab to see if this helps.  If not you can increase to a whole tab.  Take the medication 1 hour prior to getting in bed.

## 2022-04-10 NOTE — Progress Notes (Signed)
Subjective:    Patient ID: [name], male    DOB: Jun 03, 1951, 71 y.o.   MRN: [id number]  Chief Complaint  Patient presents with   Follow-up    HPI Patient was seen today for htn.  Pt states at home bp 140s/80s.  States bp was elevated when a provider from his insurance company did a home visit last wk.  Pt denies HAs, CP, changes in vision, tinnitus.  Patient endorses taking medications consistently.  Patient states blood sugar this morning was 130.  States was 140 another morning this week.  Patient endorses continued insomnia.  States is never really gotten much sleep but has noticed it becoming worse over the years.  Last night got 2 hours of sleep.  Patient endorses snoring when he is able to fall asleep.  Has tried OTC melatonin, tea, hydroxyzine.  Past Medical History:  Diagnosis Date   Diabetes mellitus without complication (HCC)    Insomnia    Prostate cancer ([place])     No Known Allergies  ROS General: Denies fever, chills, night sweats, changes in weight, changes in appetite + insomnia HEENT: Denies headaches, ear pain, changes in vision, rhinorrhea, sore throat CV: Denies CP, palpitations, SOB, orthopnea Pulm: Denies SOB, cough, wheezing GI: Denies abdominal pain, nausea, vomiting, diarrhea, constipation GU: Denies dysuria, hematuria, frequency Msk: Denies muscle cramps, joint pains Neuro: Denies weakness, numbness, tingling Skin: Denies rashes, bruising Psych: Denies depression, anxiety, hallucinations     Objective:    Blood pressure (!) 200/96, pulse 90, temperature 98.8 F (37.1 C), temperature source Oral, height '5\' 6"'$  (1.676 m), weight 152 lb 12.8 oz (69.3 kg), SpO2 99 %.  Gen. Pleasant, well-nourished, in no distress, normal affect   HEENT: [place]/AT, face symmetric, conjunctiva clear, no scleral icterus, PERRLA, EOMI, nares patent without drainage. Neck: No JVD, no thyromegaly, no carotid bruits Lungs: no accessory muscle use, CTAB, no wheezes or  rales Cardiovascular: RRR, no m/r/g, no peripheral edema Abdomen: BS present, soft, NT/ND, no hepatosplenomegaly. Neuro:  A&Ox3, CN II-XII intact, normal gait Skin:  Warm, no lesions/ rash   Wt Readings from Last 3 Encounters:  04/10/22 152 lb 12.8 oz (69.3 kg)  01/06/22 151 lb 9.6 oz (68.8 kg)  12/09/21 158 lb (71.7 kg)    Lab Results  Component Value Date   WBC 3.6 (L) 03/20/2021   HGB 10.2 (L) 03/20/2021   HCT 28.4 (L) 03/20/2021   PLT 228.0 03/20/2021   GLUCOSE 86 03/20/2021   CHOL 137 09/07/2020   TRIG 56 09/07/2020   HDL 53 09/07/2020   LDLCALC 70 09/07/2020   ALT 15 03/20/2021   AST 17 03/20/2021   NA 142 03/20/2021   K 4.1 03/20/2021   CL 105 03/20/2021   CREATININE 1.34 03/20/2021   BUN 27 (H) 03/20/2021   CO2 31 03/20/2021   TSH 1.01 09/07/2020   PSA 6.49 (H) 03/21/2020   HGBA1C 7.7 (A) 01/06/2022   MICROALBUR 13.6 (H) 11/10/2018    Assessment/Plan:  Hypertensive urgency  -BP uncontrolled 200/110 -Repeat 200/96 -Given this increase discussed starting hydralazine 10 mg 3 times daily -Continue other medications including diltiazem LA 360 mg, triamterene-HCTZ 37.5-25 mg, losartan 100 mg. -Renal artery ultrasound negative for stenosis on 05/16/2021 -Given strict precautions -Patient encouraged to bring BP cuff and readings to next clinic appointment - Plan: CMP, Urinalysis with Reflex Microscopic, TSH, T4, Free, Lipid panel, hydrALAZINE (APRESOLINE) 10 MG tablet  Type 2 diabetes mellitus with diabetic polyneuropathy, with long-term current use of insulin (  [place]) -Hemoglobin A1c 7.7% on 01/06/2022 -Continue lifestyle modifications -Continue current medications including Novolin 70/30 20 units in a.m. and 24 units at bedtime -Plan: Microalbumin creatinine ratio  Hypertensive retinopathy of both eyes -Continue follow-up with ophthalmology -Discussed the importance of BP control  Severe nonproliferative diabetic retinopathy of right eye with macular edema  associated with type 2 diabetes mellitus ([place]) -Discussed the importance of glycemic control -Continue follow-up with ophthalmology  Insomnia, unspecified type -Sleep hygiene  - Plan: traZODone (DESYREL) 50 MG tablet  F/u in 2-3 weeks, sooner if needed  Grier Mitts, MD

## 2022-04-18 ENCOUNTER — Telehealth: Payer: Self-pay

## 2022-04-23 ENCOUNTER — Telehealth: Payer: Self-pay | Admitting: Family Medicine

## 2022-04-23 NOTE — Telephone Encounter (Signed)
Left message for patient to call back and schedule Medicare Annual Wellness Visit (AWV) either virtually or in office. Left  my Herbie Drape number [phone]   Last AWV 04/17/21 ; please schedule at anytime with [facility] Nurse Health Advisor 1 or 2

## 2022-04-24 ENCOUNTER — Other Ambulatory Visit: Payer: Self-pay | Admitting: Family Medicine

## 2022-04-24 DIAGNOSIS — R809 Proteinuria, unspecified: Secondary | ICD-10-CM

## 2022-04-24 DIAGNOSIS — N179 Acute kidney failure, unspecified: Secondary | ICD-10-CM

## 2022-04-30 ENCOUNTER — Ambulatory Visit (INDEPENDENT_AMBULATORY_CARE_PROVIDER_SITE_OTHER): Payer: Medicare HMO | Admitting: Podiatry

## 2022-04-30 DIAGNOSIS — L97512 Non-pressure chronic ulcer of other part of right foot with fat layer exposed: Secondary | ICD-10-CM

## 2022-04-30 DIAGNOSIS — M2041 Other hammer toe(s) (acquired), right foot: Secondary | ICD-10-CM

## 2022-04-30 DIAGNOSIS — E11621 Type 2 diabetes mellitus with foot ulcer: Secondary | ICD-10-CM

## 2022-04-30 DIAGNOSIS — M2042 Other hammer toe(s) (acquired), left foot: Secondary | ICD-10-CM

## 2022-04-30 NOTE — Progress Notes (Signed)
Patient presents to the office stating that the shoes that patient got a month ago were too big and the width was wide too and Darrick Meigs is going to get with [facility] and see what we can do to get the shoes and inserts corrected and patient got the 448 boots and needs a 8 1/2.

## 2022-05-02 ENCOUNTER — Ambulatory Visit: Payer: Self-pay

## 2022-05-02 DIAGNOSIS — E1142 Type 2 diabetes mellitus with diabetic polyneuropathy: Secondary | ICD-10-CM

## 2022-05-02 DIAGNOSIS — I10 Essential (primary) hypertension: Secondary | ICD-10-CM

## 2022-05-02 NOTE — Chronic Care Management (AMB) (Signed)
Chronic Care Management   CCM RN Visit Note  05/02/2022 Name: [name] MRN: [id number] DOB: July 17, 1951  Subjective: [name] is a 71 y.o. year old male who is a primary care patient of [name], MD. The care management team was consulted for assistance with disease management and care coordination needs.    Engaged with patient by telephone for  Case closed   in response to provider referral for case management and/or care coordination services.   Consent to Services:  The patient was given information about Chronic Care Management services, agreed to services, and gave verbal consent prior to initiation of services.  Please see initial visit note for detailed documentation.   Patient agreed to services and verbal consent obtained.   Assessment: Review of patient past medical history, allergies, medications, health status, including review of consultants reports, laboratory and other test data, was performed as part of comprehensive evaluation and provision of chronic care management services.   SDOH (Social Determinants of Health) assessments and interventions performed:    CCM Care Plan  No Known Allergies  Outpatient Encounter Medications as of 05/02/2022  Medication Sig   atorvastatin (LIPITOR) 40 MG tablet TAKE 1 TABLET BY MOUTH EVERY DAY   blood glucose meter kit and supplies Dispense based on patient and insurance preference. Use to check glucose daily (FOR ICD-10 E10.9, E11.9).   cephALEXin (KEFLEX) 500 MG capsule Take 1 capsule (500 mg total) by mouth 3 (three) times daily.   Continuous Blood Gluc Receiver (DEXCOM G6 RECEIVER) DEVI 1 Device by Does not apply route as directed.   Continuous Blood Gluc Sensor (DEXCOM G6 SENSOR) MISC 1 Device by Does not apply route as directed.   Continuous Blood Gluc Transmit (DEXCOM G6 TRANSMITTER) MISC 1 Device by Does not apply route as directed.   diltiazem (MATZIM LA) 360 MG 24 hr tablet Take 1 tablet (360 mg total) by  mouth daily.   ferrous gluconate (FERGON) 324 MG tablet TAKE 1 TABLET BY MOUTH DAILY WITH BREAKFAST   hydrALAZINE (APRESOLINE) 10 MG tablet Take 1 tablet (10 mg total) by mouth 3 (three) times daily.   insulin isophane & regular human (NOVOLIN 70/30 FLEXPEN) (70-30) 100 UNIT/ML KwikPen Inject 20 Units into the skin daily before breakfast AND 24 Units at bedtime.   Insulin Pen Needle (BD PEN NEEDLE NANO 2ND GEN) 32G X 4 MM MISC To use with insulin pen twice a day.   ketoconazole (NIZORAL) 2 % cream Apply to toes daily   Lancet Devices (ONE TOUCH DELICA LANCING DEV) MISC 1 Device by Does not apply route as directed.   latanoprost (XALATAN) 0.005 % ophthalmic solution SMARTSIG:In Eye(s)   losartan (COZAAR) 100 MG tablet TAKE 1 TABLET BY MOUTH EVERY DAY   omeprazole (PRILOSEC) 40 MG capsule TAKE 1 CAPSULE BY MOUTH EVERY DAY   ondansetron (ZOFRAN ODT) 4 MG disintegrating tablet 51m ODT q4 hours prn nausea/vomit   OneTouch Delica Lancets 329BMISC 1 Device by Does not apply route as directed. Use to test up to 4x daily.   ONETOUCH ULTRA test strip USE UP TO 4X DAILY   traZODone (DESYREL) 50 MG tablet Take 0.5-1 tablets (25-50 mg total) by mouth at bedtime as needed for sleep.   triamterene-hydrochlorothiazide (DYAZIDE) 37.5-25 MG capsule Take 1 each (1 capsule total) by mouth daily.   No facility-administered encounter medications on file as of 05/02/2022.    Patient Active Problem List   Diagnosis Date Noted   Delayed sleep phase syndrome  11/13/2020   Prostate cancer ([place]) 09/16/2020   Severe nonproliferative diabetic retinopathy of right eye with macular edema associated with type 2 diabetes mellitus ([place]) 08/28/2020   Age-related nuclear cataract, bilateral 08/28/2020   Hypertensive retinopathy of both eyes 08/28/2020   Bilateral carotid artery stenosis 08/28/2020   Murmur 08/28/2020   Diabetic macular edema ([place]) 07/13/2020   Mild nonproliferative diabetic retinopathy of right eye with  macular edema associated with type 2 diabetes mellitus ([place]) 07/13/2020   BPH associated with nocturia 02/21/2020   Type 2 diabetes mellitus with diabetic polyneuropathy, without long-term current use of insulin ([place]) 07/06/2019   Dyslipidemia 07/06/2019   ED (erectile dysfunction) 12/24/2018   Osteoarthritis 11/25/2018   Uncontrolled type 2 diabetes mellitus with hyperglycemia ([place]) 11/12/2018   Hyperlipidemia associated with type 2 diabetes mellitus ([place]) 11/12/2018   Essential hypertension 11/10/2018   Insomnia 11/10/2018   Diabetes ([place]) 01/23/2016   Depression (em[place]) 01/22/2016    Conditions to be addressed/monitored:HTN and DMII  Care Plan : RN Care Manager Plan of Care  Updates made by [name], RN since 05/02/2022 12:00 AM  Completed 05/02/2022   Problem: Chronic Disease Management and Care Coordination Needs (DM,HTN and HLD) Resolved 05/02/2022  Priority: High     Long-Range Goal: Establish Plan of Care for Chronic Disease Management Needs (DM,HTN and HLD) Completed 05/02/2022  Start Date: 10/03/2021  Expected End Date: 03/13/2023  Priority: High  Note:   Case closed goals not met Current Barriers:  Chronic Disease Management support and education needs related to HTN, HLD, and DMII  Pt states that his CBGs have been in the 130's in the morning and can be up to 222 in the afternoon.  Denies any hypoglycemia.  States he is trying to watch what he eat but sometimes likes something sweet.  States he tries to cook at home most of the time and does not uses salt. States he is very active working on houses and cars.  States his foot ulcer is healed now. STates they have ordered him diabetic shoes and he is waiting for them to be delivered.   States he is checking is feet daily. States that  he is checking his B/P with the top numbers from 122 and 124. States he does not remember the bottom numbers.    RNCM Clinical Goal(s):  Patient will verbalize understanding of plan for  management of HTN, HLD, and DMII as evidenced by voiced adherence to plan of care verbalize basic understanding of  HTN, HLD, and DMII disease process and self health management plan as evidenced by voiced understanding and teach back take all medications exactly as prescribed and will call provider for medication related questions as evidenced by dispense report and pt verbalization attend all scheduled medical appointments: Dr. Volanda Napoleon 04/10/22 as evidenced by medical records demonstrate Improved adherence to prescribed treatment plan for HTN, HLD, and DMII as evidenced by readings within limits, voiced adherence to plan of care continue to work with RN Care Manager to address care management and care coordination needs related to  HTN, HLD, and DMII as evidenced by adherence to CM Team Scheduled appointments through collaboration with RN Care manager, provider, and care team.   Interventions: 1:1 collaboration with primary care provider regarding development and update of comprehensive plan of care as evidenced by provider attestation and co-signature Inter-disciplinary care team collaboration (see longitudinal plan of care) Evaluation of current treatment plan related to  self management and patient's adherence to plan as established  by provider   Diabetes Interventions:  (Status:  Gaol Not Met.) Long Term Goal Assessed patient's understanding of A1c goal: <7% Provided education to patient about basic DM disease process Reviewed medications with patient and discussed importance of medication adherence Counseled on importance of regular laboratory monitoring as prescribed Discussed plans with patient for ongoing care management follow up and provided patient with direct contact information for care management team Provided patient with written educational materials related to hypo and hyperglycemia and importance of correct treatment Reviewed scheduled/upcoming provider appointments including:  Dr. Volanda Napoleon 04/10/22 Advised patient, providing education and rationale, to check cbg 1-2 times a day and record, calling provider for findings outside established parameters  Reviewed fasting blood sugar goals of 80-130 and less than 180 1 1/2-2 hours after meals. Reinforced foot care and discussed getting diabetic socks and shoes Lab Results  Component Value Date   HGBA1C 7.7 (A) 01/06/2022   Hyperlipidemia Interventions:  (Status:  Goal Met.) Long Term Goal Medication review performed; medication list updated in electronic medical record.  Provider established cholesterol goals reviewed Counseled on importance of regular laboratory monitoring as prescribed Reviewed role and benefits of statin for ASCVD risk reduction Reviewed importance of limiting foods high in cholesterol Reviewed exercise goals and target of 150 minutes per week Reviewed to avoid fried foods  Hypertension Interventions:  (Status:  Gaol Not Met.) Long Term Goal Last practice recorded BP readings:  BP Readings from Last 3 Encounters:  01/06/22 (!) 169/89  12/09/21 (!) 150/78  11/06/21 (!) 202/100  Most recent eGFR/CrCl: No results found for: EGFR  No components found for: CRCL  Evaluation of current treatment plan related to hypertension self management and patient's adherence to plan as established by provider Provided education to patient re: stroke prevention, s/s of heart attack and stroke Counseled on the importance of exercise goals with target of 150 minutes per week Discussed plans with patient for ongoing care management follow up and provided patient with direct contact information for care management team Advised patient, providing education and rationale, to monitor blood pressure daily and record, calling PCP for findings outside established parameters Discussed complications of poorly controlled blood pressure such as heart disease, stroke, circulatory complications, vision complications, kidney  impairment, sexual dysfunction Reinforced to take B/P log to his next MD appointment. Reinforced to avoid lunch meats with higher sodium  Patient Goals/Self-Care Activities: Take all medications as prescribed Attend all scheduled provider appointments Call pharmacy for medication refills 3-7 days in advance of running out of medications Call provider office for new concerns or questions  check blood sugar at prescribed times: twice daily and when you have symptoms of low or high blood sugar check feet daily for cuts, sores or redness enter blood sugar readings and medication or insulin into daily log take the blood sugar log to all doctor visits trim toenails straight across drink 6 to 8 glasses of water each day fill half of plate with vegetables manage portion size prepare main meal at home 3 to 5 days each week keep feet up while sitting wash and dry feet carefully every day wear comfortable, cotton socks wear comfortable, well-fitting shoes check blood pressure daily choose a place to take my blood pressure (home, clinic or office, retail store) write blood pressure results in a log or diary take blood pressure log to all doctor appointments call doctor for signs and symptoms of high blood pressure take medications for blood pressure exactly as prescribed begin an exercise program eat  more whole grains, fruits and vegetables, lean meats and healthy fats limit salt intake to 2334m/day call for medicine refill 2 or 3 days before it runs out take all medications exactly as prescribed call doctor with any symptoms you believe are related to your medicine  Follow Up Plan:  The patient has been provided with contact information for the care management team and has been advised to call with any health related questions or concerns.  No further follow up required: Case closed goals not met       Plan:The patient has been provided with contact information for the care management  team and has been advised to call with any health related questions or concerns.  No further follow up required: Case closed  MPeter GarterRN, [facility] CDE Care Management Coordinator [facility] ([phone]

## 2022-05-02 NOTE — Patient Instructions (Signed)
Visit Information Case closed Thank you for allowing me to share the care management and care coordination services that are available to you as part of your health plan and services through your primary care provider and medical home. Please reach out to me at [phone] if the care management/care coordination team may be of assistance to you in the future.   Peter Garter RN, Jackquline Denmark, CDE Care Management Coordinator [facility] [phone]

## 2022-05-05 ENCOUNTER — Ambulatory Visit (INDEPENDENT_AMBULATORY_CARE_PROVIDER_SITE_OTHER): Payer: Medicare HMO

## 2022-05-05 VITALS — Ht 66.5 in | Wt 155.0 lb

## 2022-05-05 DIAGNOSIS — Z0000 Encounter for general adult medical examination without abnormal findings: Secondary | ICD-10-CM | POA: Diagnosis not present

## 2022-05-05 NOTE — Patient Instructions (Signed)
[name] , Thank you for taking time to come for your Medicare Wellness Visit. I appreciate your ongoing commitment to your health goals. Please review the following plan we discussed and let me know if I can assist you in the future.   Screening recommendations/referrals: Colonoscopy: decline Recommended yearly ophthalmology/optometry visit for glaucoma screening and checkup Recommended yearly dental visit for hygiene and checkup  Vaccinations: Influenza vaccine: decline Pneumococcal vaccine: decline Tdap vaccine: decline Shingles vaccine:  decline   Covid-19: 09/10/2020, 08/08/2020, 12/26/2019  Advanced directives: Advance directive discussed with you today.   Conditions/risks identified: none  Next appointment: Follow up in one year for your annual wellness visit.   Preventive Care 70 Years and Older, Male Preventive care refers to lifestyle choices and visits with your health care provider that can promote health and wellness. What does preventive care include? A yearly physical exam. This is also called an annual well check. Dental exams once or twice a year. Routine eye exams. Ask your health care provider how often you should have your eyes checked. Personal lifestyle choices, including: Daily care of your teeth and gums. Regular physical activity. Eating a healthy diet. Avoiding tobacco and drug use. Limiting alcohol use. Practicing safe sex. Taking low doses of aspirin every day. Taking vitamin and mineral supplements as recommended by your health care provider. What happens during an annual well check? The services and screenings done by your health care provider during your annual well check will depend on your age, overall health, lifestyle risk factors, and family history of disease. Counseling  Your health care provider may ask you questions about your: Alcohol use. Tobacco use. Drug use. Emotional well-being. Home and relationship well-being. Sexual  activity. Eating habits. History of falls. Memory and ability to understand (cognition). Work and work Statistician. Screening  You may have the following tests or measurements: Height, weight, and BMI. Blood pressure. Lipid and cholesterol levels. These may be checked every 5 years, or more frequently if you are over 56 years old. Skin check. Lung cancer screening. You may have this screening every year starting at age 53 if you have a 30-pack-year history of smoking and currently smoke or have quit within the past 15 years. Fecal occult blood test (FOBT) of the stool. You may have this test every year starting at age 41. Flexible sigmoidoscopy or colonoscopy. You may have a sigmoidoscopy every 5 years or a colonoscopy every 10 years starting at age 65. Prostate cancer screening. Recommendations will vary depending on your family history and other risks. Hepatitis C blood test. Hepatitis B blood test. Sexually transmitted disease (STD) testing. Diabetes screening. This is done by checking your blood sugar (glucose) after you have not eaten for a while (fasting). You may have this done every 1-3 years. Abdominal aortic aneurysm (AAA) screening. You may need this if you are a current or former smoker. Osteoporosis. You may be screened starting at age 30 if you are at high risk. Talk with your health care provider about your test results, treatment options, and if necessary, the need for more tests. Vaccines  Your health care provider may recommend certain vaccines, such as: Influenza vaccine. This is recommended every year. Tetanus, diphtheria, and acellular pertussis (Tdap, Td) vaccine. You may need a Td booster every 10 years. Zoster vaccine. You may need this after age 28. Pneumococcal 13-valent conjugate (PCV13) vaccine. One dose is recommended after age 25. Pneumococcal polysaccharide (PPSV23) vaccine. One dose is recommended after age 36. Talk to your  health care provider about which  screenings and vaccines you need and how often you need them. This information is not intended to replace advice given to you by your health care provider. Make sure you discuss any questions you have with your health care provider. Document Released: 11/09/2015 Document Revised: 07/02/2016 Document Reviewed: 08/14/2015 Elsevier Interactive Patient Education  2017 [employer] Prevention in the Home Falls can cause injuries. They can happen to people of all ages. There are many things you can do to make your home safe and to help prevent falls. What can I do on the outside of my home? Regularly fix the edges of walkways and driveways and fix any cracks. Remove anything that might make you trip as you walk through a door, such as a raised step or threshold. Trim any bushes or trees on the path to your home. Use bright outdoor lighting. Clear any walking paths of anything that might make someone trip, such as rocks or tools. Regularly check to see if handrails are loose or broken. Make sure that both sides of any steps have handrails. Any raised decks and porches should have guardrails on the edges. Have any leaves, snow, or ice cleared regularly. Use sand or salt on walking paths during winter. Clean up any spills in your garage right away. This includes oil or grease spills. What can I do in the bathroom? Use night lights. Install grab bars by the toilet and in the tub and shower. Do not use towel bars as grab bars. Use non-skid mats or decals in the tub or shower. If you need to sit down in the shower, use a plastic, non-slip stool. Keep the floor dry. Clean up any water that spills on the floor as soon as it happens. Remove soap buildup in the tub or shower regularly. Attach bath mats securely with double-sided non-slip rug tape. Do not have throw rugs and other things on the floor that can make you trip. What can I do in the bedroom? Use night lights. Make sure that you have a  light by your bed that is easy to reach. Do not use any sheets or blankets that are too big for your bed. They should not hang down onto the floor. Have a firm chair that has side arms. You can use this for support while you get dressed. Do not have throw rugs and other things on the floor that can make you trip. What can I do in the kitchen? Clean up any spills right away. Avoid walking on wet floors. Keep items that you use a lot in easy-to-reach places. If you need to reach something above you, use a strong step stool that has a grab bar. Keep electrical cords out of the way. Do not use floor polish or wax that makes floors slippery. If you must use wax, use non-skid floor wax. Do not have throw rugs and other things on the floor that can make you trip. What can I do with my stairs? Do not leave any items on the stairs. Make sure that there are handrails on both sides of the stairs and use them. Fix handrails that are broken or loose. Make sure that handrails are as long as the stairways. Check any carpeting to make sure that it is firmly attached to the stairs. Fix any carpet that is loose or worn. Avoid having throw rugs at the top or bottom of the stairs. If you do have throw rugs, attach them  to the floor with carpet tape. Make sure that you have a light switch at the top of the stairs and the bottom of the stairs. If you do not have them, ask someone to add them for you. What else can I do to help prevent falls? Wear shoes that: Do not have high heels. Have rubber bottoms. Are comfortable and fit you well. Are closed at the toe. Do not wear sandals. If you use a stepladder: Make sure that it is fully opened. Do not climb a closed stepladder. Make sure that both sides of the stepladder are locked into place. Ask someone to hold it for you, if possible. Clearly mark and make sure that you can see: Any grab bars or handrails. First and last steps. Where the edge of each step  is. Use tools that help you move around (mobility aids) if they are needed. These include: Canes. Walkers. Scooters. Crutches. Turn on the lights when you go into a dark area. Replace any light bulbs as soon as they burn out. Set up your furniture so you have a clear path. Avoid moving your furniture around. If any of your floors are uneven, fix them. If there are any pets around you, be aware of where they are. Review your medicines with your doctor. Some medicines can make you feel dizzy. This can increase your chance of falling. Ask your doctor what other things that you can do to help prevent falls. This information is not intended to replace advice given to you by your health care provider. Make sure you discuss any questions you have with your health care provider. Document Released: 08/09/2009 Document Revised: 03/20/2016 Document Reviewed: 11/17/2014 Elsevier Interactive Patient Education  2017 [employer].

## 2022-05-05 NOTE — Progress Notes (Signed)
I connected with Arnaldo Natal today by telephone and verified that I am speaking with the correct person using two identifiers. Location patient: home Location provider: work Persons participating in the virtual visit: Terressa Koyanagi, Glenna Durand LPN.   I discussed the limitations, risks, security and privacy concerns of performing an evaluation and management service by telephone and the availability of in person appointments. I also discussed with the patient that there may be a patient responsible charge related to this service. The patient expressed understanding and verbally consented to this telephonic visit.    Interactive audio and video telecommunications were attempted between this provider and patient, however failed, due to patient having technical difficulties OR patient did not have access to video capability.  We continued and completed visit with audio only.     Vital signs may be patient reported or missing.  Subjective:   [name] is a 71 y.o. male who presents for Medicare Annual/Subsequent preventive examination.  Review of Systems     Cardiac Risk Factors include: advanced age (>68mn, >>50women);diabetes mellitus;dyslipidemia;hypertension;male gender     Objective:    Today's Vitals   05/05/22 1012  Weight: 155 lb (70.3 kg)  Height: 5' 6.5" (1.689 m)   Body mass index is 24.64 kg/m.     05/05/2022   10:16 AM 07/04/2021    1:45 PM 05/09/2021   11:17 AM 04/17/2021    1:59 PM 02/24/2021    8:10 PM 11/13/2020    1:37 PM 11/16/2018    3:42 PM  Advanced Directives  Does Patient Have a Medical Advance Directive? No No No Yes No No No  Type of AScientist, research (medical)Living will     [place]?    No - copy requested     Would patient like information on creating a medical advance directive?  No - Patient declined No - Patient declined   No - Patient declined No - Patient declined     Current Medications (verified) Outpatient Encounter Medications as of 05/05/2022  Medication Sig   atorvastatin (LIPITOR) 40 MG tablet TAKE 1 TABLET BY MOUTH EVERY DAY   blood glucose meter kit and supplies Dispense based on patient and insurance preference. Use to check glucose daily (FOR ICD-10 E10.9, E11.9).   Continuous Blood Gluc Receiver (DEXCOM G6 RECEIVER) DEVI 1 Device by Does not apply route as directed.   Continuous Blood Gluc Sensor (DEXCOM G6 SENSOR) MISC 1 Device by Does not apply route as directed.   Continuous Blood Gluc Transmit (DEXCOM G6 TRANSMITTER) MISC 1 Device by Does not apply route as directed.   diltiazem (MATZIM LA) 360 MG 24 hr tablet Take 1 tablet (360 mg total) by mouth daily.   ferrous gluconate (FERGON) 324 MG tablet TAKE 1 TABLET BY MOUTH DAILY WITH BREAKFAST   hydrALAZINE (APRESOLINE) 10 MG tablet Take 1 tablet (10 mg total) by mouth 3 (three) times daily.   insulin isophane & regular human (NOVOLIN 70/30 FLEXPEN) (70-30) 100 UNIT/ML KwikPen Inject 20 Units into the skin daily before breakfast AND 24 Units at bedtime.   Insulin Pen Needle (BD PEN NEEDLE NANO 2ND GEN) 32G X 4 MM MISC To use with insulin pen twice a day.   ketoconazole (NIZORAL) 2 % cream Apply to toes daily   Lancet Devices (ONE TOUCH DELICA LANCING DEV) MISC 1 Device by Does not apply route as directed.   latanoprost (XALATAN) 0.005 % ophthalmic solution  SMARTSIG:In Eye(s)   losartan (COZAAR) 100 MG tablet TAKE 1 TABLET BY MOUTH EVERY DAY   omeprazole (PRILOSEC) 40 MG capsule TAKE 1 CAPSULE BY MOUTH EVERY DAY   ondansetron (ZOFRAN ODT) 4 MG disintegrating tablet 6m ODT q4 hours prn nausea/vomit   OneTouch Delica Lancets 323FMISC 1 Device by Does not apply route as directed. Use to test up to 4x daily.   ONETOUCH ULTRA test strip USE UP TO 4X DAILY   traZODone (DESYREL) 50 MG tablet Take 0.5-1 tablets (25-50 mg total) by mouth at bedtime as needed for sleep.    triamterene-hydrochlorothiazide (DYAZIDE) 37.5-25 MG capsule Take 1 each (1 capsule total) by mouth daily.   cephALEXin (KEFLEX) 500 MG capsule Take 1 capsule (500 mg total) by mouth 3 (three) times daily. (Patient not taking: Reported on 05/05/2022)   No facility-administered encounter medications on file as of 05/05/2022.    Allergies (verified) Patient has no known allergies.   History: Past Medical History:  Diagnosis Date   Diabetes mellitus without complication (HCC)    Insomnia    [place] cancer ([place]    Past Surgical History:  Procedure Laterality Date   PROSTATE BIOPSY     Family History  Problem Relation Age of Onset   Colon cancer Mother    Diabetes Brother    Breast cancer Neg Hx    Pancreatic cancer Neg Hx    Prostate cancer Neg Hx    Social History   Socioeconomic History   Marital status: Single    Spouse name: Not on file   Number of children: 2   Years of education: Not on file   Highest education level: Not on file  Occupational History    Comment: retired  Tobacco Use   Smoking status: Never   Smokeless tobacco: Never  Vaping Use   Vaping Use: Never used  Substance and Sexual Activity   Alcohol use: No   Drug use: No   Sexual activity: Yes  Other Topics Concern   Not on file  Social History Narrative   Not on file   Social Determinants of Health   Financial Resource Strain: Low Risk  (05/05/2022)   Overall Financial Resource Strain (CARDIA)    Difficulty of Paying Living Expenses: Not hard at all  Food Insecurity: No Food Insecurity (05/05/2022)   Hunger Vital Sign    Worried About Running Out of Food in the Last Year: Never true    Ran Out of Food in the Last Year: Never true  Transportation Needs: No Transportation Needs (05/05/2022)   PRAPARE - THydrologist(Medical): No    Lack of Transportation (Non-Medical): No  Physical Activity: Inactive (05/05/2022)   Exercise Vital Sign    Days of Exercise per Week:  0 days    Minutes of Exercise per Session: 0 min  Stress: No Stress Concern Present (05/05/2022)   FAda   Feeling of Stress : Not at all  Social Connections: Socially Isolated (04/17/2021)   Social Connection and Isolation Panel [NHANES]    Frequency of Communication with Friends and Family: Twice a week    Frequency of Social Gatherings with Friends and Family: Twice a week    Attends Religious Services: Never    AMarine scientistor Organizations: No    Attends CArchivistMeetings: Never    Marital Status: Divorced    Tobacco Counseling Counseling given: Not  Answered   Clinical Intake:  Pre-visit preparation completed: Yes  Pain : No/denies pain     Nutritional Status: BMI of 19-24  Normal Nutritional Risks: None Diabetes: Yes  How often do you need to have someone help you when you read instructions, pamphlets, or other written materials from your doctor or pharmacy?: 1 - Never What is the last grade level you completed in school?: 12th grade  Diabetic? Yes Nutrition Risk Assessment:  Has the patient had any N/V/D within the last 2 months?  No  Does the patient have any non-healing wounds?  No  Has the patient had any unintentional weight loss or weight gain?  No   Diabetes:  Is the patient diabetic?  Yes  If diabetic, was a CBG obtained today?  No  Did the patient bring in their glucometer from home?  No  How often do you monitor your CBG's? daily.   Financial Strains and Diabetes Management:  Are you having any financial strains with the device, your supplies or your medication? No .  Does the patient want to be seen by Chronic Care Management for management of their diabetes?  No  Would the patient like to be referred to a Nutritionist or for Diabetic Management?  No   Diabetic Exams:  Diabetic Eye Exam: Overdue for diabetic eye exam. Pt has been advised about the  importance in completing this exam. Patient advised to call and schedule an eye exam. Diabetic Foot Exam: Overdue, Pt has been advised about the importance in completing this exam. Pt is scheduled for diabetic foot exam on next appointment.   Interpreter Needed?: No  Information entered by :: NAllen LPN   Activities of Daily Living    05/05/2022   10:17 AM  In your present state of health, do you have any difficulty performing the following activities:  Hearing? 0  Vision? 0  Difficulty concentrating or making decisions? 0  Walking or climbing stairs? 0  Dressing or bathing? 0  Doing errands, shopping? 0  Preparing Food and eating ? N  Using the Toilet? N  In the past six months, have you accidently leaked urine? N  Do you have problems with loss of bowel control? N  Managing your Medications? N  Managing your Finances? N  Housekeeping or managing your Housekeeping? N    Patient Care Team: [name], MD as PCP - General (Family Medicine) Jabier Gauss as Oncology Nurse Navigator  Indicate any recent Medical Services you may have received from other than [facility] providers in the past year (date may be approximate).     Assessment:   This is a routine wellness examination for [place].  Hearing/Vision screen Vision Screening - Comments:: No regular eye exams, [facility]  Dietary issues and exercise activities discussed: Current Exercise Habits: The patient does not participate in regular exercise at present   Goals Addressed             This Visit's Progress    Patient Stated       05/05/2022, stay healthy       Depression Screen    05/05/2022   10:17 AM 04/10/2022   10:15 AM 11/06/2021    1:22 PM 05/09/2021   11:46 AM 04/17/2021    2:00 PM 04/17/2021    1:56 PM 11/16/2018    3:42 PM  PHQ 2/9 Scores  PHQ - 2 Score 0 0 0 0 0 0 0  PHQ- 9 Score  0  Fall Risk    05/05/2022   10:17 AM 04/10/2022   10:15 AM 05/09/2021   11:46 AM 04/17/2021     2:00 PM 04/04/2020    7:37 AM  Fall Risk   Falls in the past year? 0 0 0 0 0  Number falls in past yr: 0 0 0 0 0  Injury with Fall? 0 0 0 0 0  Risk for fall due to : Medication side effect No Fall Risks     Follow up Falls evaluation completed;Education provided;Falls prevention discussed Falls evaluation completed Education provided Falls evaluation completed     FALL RISK PREVENTION PERTAINING TO THE HOME:  Any stairs in or around the home? No  If so, are there any without handrails?  N/a Home free of loose throw rugs in walkways, pet beds, electrical cords, etc? Yes  Adequate lighting in your home to reduce risk of falls? Yes   ASSISTIVE DEVICES UTILIZED TO PREVENT FALLS:  Life alert? No  Use of a cane, walker or w/c? No  Grab bars in the bathroom? No  Shower chair or bench in shower? No  Elevated toilet seat or a handicapped toilet? Yes   TIMED UP AND GO:  Was the test performed? No .      Cognitive Function:        05/05/2022   10:17 AM  6CIT Screen  What Year? 0 points  What month? 0 points  What time? 0 points  Count back from 20 0 points  Months in reverse 4 points  Repeat phrase 10 points  Total Score 14 points    Immunizations Immunization History  Administered Date(s) Administered   PFIZER(Purple Top)SARS-COV-2 Vaccination 12/26/2019, 08/08/2020, 09/10/2020   Pneumococcal Polysaccharide-23 02/21/2020    TDAP status: Due, Education has been provided regarding the importance of this vaccine. Advised may receive this vaccine at local pharmacy or Health Dept. Aware to provide a copy of the vaccination record if obtained from local pharmacy or Health Dept. Verbalized acceptance and understanding.  Flu Vaccine status: Declined, Education has been provided regarding the importance of this vaccine but patient still declined. Advised may receive this vaccine at local pharmacy or Health Dept. Aware to provide a copy of the vaccination record if obtained from  local pharmacy or Health Dept. Verbalized acceptance and understanding.  Pneumococcal vaccine status: Declined,  Education has been provided regarding the importance of this vaccine but patient still declined. Advised may receive this vaccine at local pharmacy or Health Dept. Aware to provide a copy of the vaccination record if obtained from local pharmacy or Health Dept. Verbalized acceptance and understanding.   Covid-19 vaccine status: Completed vaccines  Qualifies for Shingles Vaccine? Yes   Zostavax completed No   Shingrix Completed?: No.    Education has been provided regarding the importance of this vaccine. Patient has been advised to call insurance company to determine out of pocket expense if they have not yet received this vaccine. Advised may also receive vaccine at local pharmacy or Health Dept. Verbalized acceptance and understanding.  Screening Tests Health Maintenance  Topic Date Due   Zoster Vaccines- Shingrix (1 of 2) Never done   COLONOSCOPY (Pts 45-20yr Insurance coverage will need to be confirmed)  Never done   COVID-19 Vaccine (4 - Booster for Pfizer series) 11/05/2020   Pneumonia Vaccine 70 Years old (2 - PCV) 02/20/2021   FOOT EXAM  03/22/2021   OPHTHALMOLOGY EXAM  07/13/2021   INFLUENZA VACCINE  05/27/2022   HEMOGLOBIN  A1C  07/09/2022   Hepatitis C Screening  Completed   HPV VACCINES  Aged Out   TETANUS/TDAP  Discontinued    Health Maintenance  Health Maintenance Due  Topic Date Due   Zoster Vaccines- Shingrix (1 of 2) Never done   COLONOSCOPY (Pts 45-49yr Insurance coverage will need to be confirmed)  Never done   COVID-19 Vaccine (4 - Booster for Pfizer series) 11/05/2020   Pneumonia Vaccine 69 Years old (2 - PCV) 02/20/2021   FOOT EXAM  03/22/2021   OPHTHALMOLOGY EXAM  07/13/2021    Colorectal cancer screening: decline  Lung Cancer Screening: (Low Dose CT Chest recommended if Age 70-80years, 30 pack-year currently smoking OR have quit w/in  15years.) does not qualify.   Lung Cancer Screening Referral: no  Additional Screening:  Hepatitis C Screening: does qualify; Completed 01/22/2016  Vision Screening: Recommended annual ophthalmology exams for early detection of glaucoma and other disorders of the eye. Is the patient up to date with their annual eye exam?  No  Who is the provider or what is the name of the office in which the patient attends annual eye exams? [facility] is not established with a provider, would they like to be referred to a provider to establish care? No .   Dental Screening: Recommended annual dental exams for proper oral hygiene  Community Resource Referral / Chronic Care Management: CRR required this visit?  No   CCM required this visit?  No      Plan:     I have personally reviewed and noted the following in the patient's chart:   Medical and social history Use of alcohol, tobacco or illicit drugs  Current medications and supplements including opioid prescriptions. Patient is not currently taking opioid prescriptions. Functional ability and status Nutritional status Physical activity Advanced directives List of other physicians Hospitalizations, surgeries, and ER visits in previous 12 months Vitals Screenings to include cognitive, depression, and falls Referrals and appointments  In addition, I have reviewed and discussed with patient certain preventive protocols, quality metrics, and best practice recommendations. A written personalized care plan for preventive services as well as general preventive health recommendations were provided to patient.     NKellie Simmering LPN   [id number]  Nurse Notes: none  Due to this being a virtual visit, the after visit summary with patients personalized plan was offered to patient via mail or my-chart. Patient would like to access on my-chart

## 2022-05-06 ENCOUNTER — Other Ambulatory Visit: Payer: Self-pay | Admitting: Family Medicine

## 2022-05-06 DIAGNOSIS — G4700 Insomnia, unspecified: Secondary | ICD-10-CM

## 2022-05-09 ENCOUNTER — Other Ambulatory Visit (INDEPENDENT_AMBULATORY_CARE_PROVIDER_SITE_OTHER): Payer: Medicare HMO

## 2022-05-09 ENCOUNTER — Other Ambulatory Visit: Payer: Self-pay | Admitting: Family Medicine

## 2022-05-09 DIAGNOSIS — N179 Acute kidney failure, unspecified: Secondary | ICD-10-CM | POA: Diagnosis not present

## 2022-05-09 DIAGNOSIS — I10 Essential (primary) hypertension: Secondary | ICD-10-CM

## 2022-05-09 LAB — BASIC METABOLIC PANEL
BUN: 40 mg/dL — ABNORMAL HIGH (ref 6–23)
CO2: 26 mEq/L (ref 19–32)
Calcium: 9.2 mg/dL (ref 8.4–10.5)
Chloride: 107 mEq/L (ref 96–112)
Creatinine, Ser: 2.19 mg/dL — ABNORMAL HIGH (ref 0.40–1.50)
GFR: 29.89 mL/min — ABNORMAL LOW (ref >60.00)
Glucose, Bld: 170 mg/dL — ABNORMAL HIGH (ref 70–99)
Potassium: 5.1 mEq/L (ref 3.5–5.1)
Sodium: 141 mEq/L (ref 135–145)

## 2022-05-09 MED ORDER — HYDRALAZINE HCL 25 MG PO TABS: 25.0000 mg | ORAL_TABLET | Freq: Three times a day (TID) | ORAL | 6 refills | Status: DC

## 2022-05-13 ENCOUNTER — Telehealth: Payer: Medicare HMO

## 2022-05-14 NOTE — Progress Notes (Unsigned)
[name] is a 71 y.o.male with history of hypertension, insomnia, DM 2, hyperlipidemia, OSA, and PAD here today to follow on HTN. He is Dr Carita Pian pt, she is not available today. He was evaluated on 04/10/22 with BP 200/100, HTN treatment was adjusted, Hydralazine added. I saw pt on 04/04/20.  Medications:Diltiazem 360 mg daily mg daily and Hydralazine 10 mg tid. BP readings at home:180-190's/80-90.  He states that he took 3 Amsidine-HCTZ again and SBP was 120's, he has not taken this med for 3 weeks. Side effects:None Negative for unusual or severe headache, visual changes, exertional chest pain, dyspnea,  focal weakness, or edema.  He has not called to arrange appt with nephrologist.  Lab Results  Component Value Date   CREATININE 2.19 (H) 05/09/2022   BUN 40 (H) 05/09/2022   NA 141 05/09/2022   K 5.1 05/09/2022   CL 107 05/09/2022   CO2 26 05/09/2022   Lab Results  Component Value Date   MICROALBUR 113.6 (H) 04/10/2022   MICROALBUR 13.6 (H) 11/10/2018   Lab Results  Component Value Date   TSH 1.35 04/10/2022   Lab Results  Component Value Date   WBC 3.6 (L) 03/20/2021   HGB 10.2 (L) 03/20/2021   HCT 28.4 (L) 03/20/2021   MCV 88.3 03/20/2021   PLT 228.0 03/20/2021  DM 2: Reporting BS in the 200s.  Lab Results  Component Value Date   HGBA1C 7.7 (A) 01/06/2022   *** Review of Systems Rest see pertinent positives and negatives per HPI.  Current Outpatient Medications on File Prior to Visit  Medication Sig Dispense Refill   atorvastatin (LIPITOR) 40 MG tablet TAKE 1 TABLET BY MOUTH EVERY DAY 90 tablet 1   blood glucose meter kit and supplies Dispense based on patient and insurance preference. Use to check glucose daily (FOR ICD-10 E10.9, E11.9). 1 each 0   Continuous Blood Gluc Receiver (DEXCOM G6 RECEIVER) DEVI 1 Device by Does not apply route as directed. 1 each 0   Continuous Blood Gluc Sensor (DEXCOM G6 SENSOR) MISC 1 Device by Does not apply route as  directed. 9 each 3   Continuous Blood Gluc Transmit (DEXCOM G6 TRANSMITTER) MISC 1 Device by Does not apply route as directed. 1 each 3   diltiazem (MATZIM LA) 360 MG 24 hr tablet Take 1 tablet (360 mg total) by mouth daily. 90 tablet 1   ferrous gluconate (FERGON) 324 MG tablet TAKE 1 TABLET BY MOUTH DAILY WITH BREAKFAST 90 tablet 1   insulin isophane & regular human (NOVOLIN 70/30 FLEXPEN) (70-30) 100 UNIT/ML KwikPen Inject 20 Units into the skin daily before breakfast AND 24 Units at bedtime. 15 mL 11   Insulin Pen Needle (BD PEN NEEDLE NANO 2ND GEN) 32G X 4 MM MISC To use with insulin pen twice a day. 200 each 3   ketoconazole (NIZORAL) 2 % cream Apply to toes daily 60 g 2   Lancet Devices (ONE TOUCH DELICA LANCING DEV) MISC 1 Device by Does not apply route as directed. 1 each 0   latanoprost (XALATAN) 0.005 % ophthalmic solution SMARTSIG:In Eye(s)     omeprazole (PRILOSEC) 40 MG capsule TAKE 1 CAPSULE BY MOUTH EVERY DAY 30 capsule 1   ondansetron (ZOFRAN ODT) 4 MG disintegrating tablet 33m ODT q4 hours prn nausea/vomit 20 tablet 0   OneTouch Delica Lancets 357QMISC 1 Device by Does not apply route as directed. Use to test up to 4x daily. 400 each 1   ONETOUCH  ULTRA test strip USE UP TO 4X DAILY 300 strip 2   traZODone (DESYREL) 50 MG tablet Take 0.5-1 tablets (25-50 mg total) by mouth at bedtime as needed for sleep. 30 tablet 3   No current facility-administered medications on file prior to visit.     Past Medical History:  Diagnosis Date   Diabetes mellitus without complication (HCC)    Insomnia    Prostate cancer ([place])     No Known Allergies  Social History   Socioeconomic History   Marital status: Single    Spouse name: Not on file   Number of children: 2   Years of education: Not on file   Highest education level: Not on file  Occupational History    Comment: retired  Tobacco Use   Smoking status: Never   Smokeless tobacco: Never  Vaping Use   Vaping Use: Never used   Substance and Sexual Activity   Alcohol use: No   Drug use: No   Sexual activity: Yes  Other Topics Concern   Not on file  Social History Narrative   Not on file   Social Determinants of Health   Financial Resource Strain: Low Risk  (05/05/2022)   Overall Financial Resource Strain (CARDIA)    Difficulty of Paying Living Expenses: Not hard at all  Food Insecurity: No Food Insecurity (05/05/2022)   Hunger Vital Sign    Worried About Running Out of Food in the Last Year: Never true    [place] in the Last Year: Never true  Transportation Needs: No Transportation Needs (05/05/2022)   PRAPARE - Hydrologist (Medical): No    Lack of Transportation (Non-Medical): No  Physical Activity: Inactive (05/05/2022)   Exercise Vital Sign    Days of Exercise per Week: 0 days    Minutes of Exercise per Session: 0 min  Stress: No Stress Concern Present (05/05/2022)   [place]    Feeling of Stress : Not at all  Social Connections: Socially Isolated (04/17/2021)   Social Connection and Isolation Panel [NHANES]    Frequency of Communication with Friends and Family: Twice a week    Frequency of Social Gatherings with Friends and Family: Twice a week    Attends Religious Services: Never    Marine scientist or Organizations: No    Attends Archivist Meetings: Never    Marital Status: Divorced    Vitals:   05/16/22 0702  BP: (!) 162/98  Pulse: 98  Resp: 12  SpO2: 98%   Body mass index is 24.07 kg/m.  Physical Exam Nursing note reviewed.  Constitutional:      General: He is not in acute distress.    Appearance: He is well-developed.  HENT:     Head: Normocephalic and atraumatic.  Eyes:     Conjunctiva/sclera: Conjunctivae normal.  Cardiovascular:     Rate and Rhythm: Normal rate and regular rhythm.     Pulses:          Posterior tibial pulses are 2+ on the right side  and 2+ on the left side.     Heart sounds: No murmur heard. Pulmonary:     Effort: Pulmonary effort is normal. No respiratory distress.     Breath sounds: Normal breath sounds.  Abdominal:     General: There is no abdominal bruit.     Palpations: Abdomen is soft. There is no hepatomegaly, mass or  pulsatile mass.     Tenderness: There is no abdominal tenderness.  Lymphadenopathy:     Cervical: No cervical adenopathy.  Skin:    General: Skin is warm.     Findings: No erythema or rash.  Neurological:     Mental Status: He is alert and oriented to person, place, and time.     Cranial Nerves: No cranial nerve deficit.     Gait: Gait normal.  Psychiatric:     Comments: Well groomed, good eye contact.    ASSESSMENT AND PLAN:    Sumner was seen today for hypertension.  Diagnoses and all orders for this visit:  Stage 3a chronic kidney disease ([place])  Essential hypertension -     hydrALAZINE (APRESOLINE) 10 MG tablet; Take 2 tablets (20 mg total) by mouth 3 (three) times daily. -     CBC; Future -     Basic metabolic panel; Future    Orders Placed This Encounter  Procedures   CBC   Basic metabolic panel    Essential hypertension BP has improved some but still not at goal, he is asymptomatic. Hydralazine dose increased from 10 mg tid to 20 mg tid. No changes in Diltiazem dose, eventually I think it can be changed to Amlodipine. Instructed not to take Triamterene-HCTZ. Continue monitoring BP daily and BP readings in a week. Low salt diet to continue. Clearly instructed about warning signs.    Return in about 2 weeks (around 05/30/2022).   Garren Greenman G. Martinique, MD  [facility]. [place] office.

## 2022-05-16 ENCOUNTER — Encounter: Payer: Self-pay | Admitting: Family Medicine

## 2022-05-16 ENCOUNTER — Telehealth: Payer: Self-pay

## 2022-05-16 ENCOUNTER — Ambulatory Visit (INDEPENDENT_AMBULATORY_CARE_PROVIDER_SITE_OTHER): Payer: Medicare HMO | Admitting: Family Medicine

## 2022-05-16 VITALS — BP 162/98 | HR 98 | Resp 12 | Ht 66.5 in | Wt 151.4 lb

## 2022-05-16 DIAGNOSIS — I10 Essential (primary) hypertension: Secondary | ICD-10-CM | POA: Diagnosis not present

## 2022-05-16 DIAGNOSIS — N1831 Chronic kidney disease, stage 3a: Secondary | ICD-10-CM | POA: Diagnosis not present

## 2022-05-16 LAB — CBC
HCT: 33 % — ABNORMAL LOW (ref 39.0–52.0)
Hemoglobin: 11.5 g/dL — ABNORMAL LOW (ref 13.0–17.0)
MCHC: 34.9 g/dL (ref 30.0–36.0)
MCV: 91.8 fl (ref 78.0–100.0)
Platelets: 217 10*3/uL (ref 150.0–400.0)
RBC: 3.6 Mil/uL — ABNORMAL LOW (ref 4.22–5.81)
RDW: 14.1 % (ref 11.5–15.5)
WBC: 4.9 10*3/uL (ref 4.0–10.5)

## 2022-05-16 LAB — BASIC METABOLIC PANEL
BUN: 38 mg/dL — ABNORMAL HIGH (ref 6–23)
CO2: 26 mEq/L (ref 19–32)
Calcium: 9.7 mg/dL (ref 8.4–10.5)
Chloride: 108 mEq/L (ref 96–112)
Creatinine, Ser: 2.16 mg/dL — ABNORMAL HIGH (ref 0.40–1.50)
GFR: 30.39 mL/min — ABNORMAL LOW (ref >60.00)
Glucose, Bld: 36 mg/dL — CL (ref 70–99)
Potassium: 4.5 mEq/L (ref 3.5–5.1)
Sodium: 142 mEq/L (ref 135–145)

## 2022-05-16 MED ORDER — HYDRALAZINE HCL 10 MG PO TABS
20.0000 mg | ORAL_TABLET | Freq: Three times a day (TID) | ORAL | 0 refills | Status: DC
Start: 2022-05-16 — End: 2022-06-17

## 2022-05-16 NOTE — Telephone Encounter (Signed)
I left patient a voicemail asking him to keep a log of his sugars & to call us back if he has low readings.

## 2022-05-16 NOTE — Patient Instructions (Addendum)
A few things to remember from today's visit:   Essential hypertension - Plan: hydrALAZINE (APRESOLINE) 10 MG tablet, CBC, Basic metabolic panel  If you need refills please call your pharmacy. Do not use My Chart to request refills or for acute issues that need immediate attention.   Today we increased dose of Hydralazine from 10 mg to 20 mg (2 tabs) 3 times per day. No changes in Diltiazem. Please call your kidney doctor. Blood pressure reading in a week. Continue low salt diet and adequate water intake.  Please be sure medication list is accurate. If a new problem present, please set up appointment sooner than planned today.

## 2022-05-16 NOTE — Telephone Encounter (Signed)
I spoke with patient. He will check his sugar & call back to let us know what it is.

## 2022-05-16 NOTE — Telephone Encounter (Signed)
CRITICAL VALUE STICKER  CRITICAL VALUE: Glucose 36  RECEIVER (on-site recipient of call): lab  DATE & TIME NOTIFIED: 11:45 AM  MESSENGER (representative from lab): lab  MD NOTIFIED: yes  TIME OF NOTIFICATION: 1150Am  RESPONSE: in-basket.

## 2022-05-16 NOTE — Assessment & Plan Note (Signed)
BP has improved some but still not at goal, he is asymptomatic. Hydralazine dose increased from 10 mg tid to 20 mg tid. No changes in Diltiazem dose, eventually I think it can be changed to Amlodipine. Instructed not to take Triamterene-HCTZ. Continue monitoring BP daily and BP readings in a week. Low salt diet to continue. Clearly instructed about warning signs.

## 2022-05-16 NOTE — Telephone Encounter (Signed)
I left patient a voicemail to return my call. 

## 2022-05-16 NOTE — Telephone Encounter (Signed)
Pt is returning sarah call °

## 2022-05-16 NOTE — Telephone Encounter (Signed)
Patient was alert & oriented x 3 while here this morning.

## 2022-05-16 NOTE — Telephone Encounter (Signed)
He was seen this morning and was asymptomatic. Please ask him to check BS now. For now no changes in DM2 treatment, unless he has another episode of hypoglycemia. Thanks, BJ

## 2022-05-21 ENCOUNTER — Encounter: Payer: Self-pay | Admitting: Family Medicine

## 2022-05-26 ENCOUNTER — Ambulatory Visit (INDEPENDENT_AMBULATORY_CARE_PROVIDER_SITE_OTHER): Payer: Medicare HMO

## 2022-05-26 DIAGNOSIS — E11621 Type 2 diabetes mellitus with foot ulcer: Secondary | ICD-10-CM

## 2022-05-26 DIAGNOSIS — L97512 Non-pressure chronic ulcer of other part of right foot with fat layer exposed: Secondary | ICD-10-CM

## 2022-05-26 NOTE — Progress Notes (Signed)
Patient presents today to pick up diabetic shoes and insoles.  Patient was dispensed 1 pair of diabetic shoes and 3 pairs of foam casted diabetic insoles. Fit was satisfactory. Instructions for break-in and wear was reviewed and a copy was given to the patient.   Re-appointment for regularly scheduled diabetic foot care visits or if they should experience any trouble with the shoes or insoles.  

## 2022-05-29 ENCOUNTER — Ambulatory Visit (INDEPENDENT_AMBULATORY_CARE_PROVIDER_SITE_OTHER): Payer: Medicare HMO | Admitting: Family Medicine

## 2022-05-29 VITALS — BP 190/80 | HR 91 | Temp 98.5°F | Wt 150.6 lb

## 2022-05-29 DIAGNOSIS — E1142 Type 2 diabetes mellitus with diabetic polyneuropathy: Secondary | ICD-10-CM

## 2022-05-29 DIAGNOSIS — Z794 Long term (current) use of insulin: Secondary | ICD-10-CM

## 2022-05-29 DIAGNOSIS — N1832 Chronic kidney disease, stage 3b: Secondary | ICD-10-CM | POA: Diagnosis not present

## 2022-05-29 DIAGNOSIS — E782 Mixed hyperlipidemia: Secondary | ICD-10-CM

## 2022-05-29 DIAGNOSIS — I158 Other secondary hypertension: Secondary | ICD-10-CM

## 2022-05-29 LAB — BASIC METABOLIC PANEL
BUN: 31 mg/dL — ABNORMAL HIGH (ref 6–23)
CO2: 26 mEq/L (ref 19–32)
Calcium: 9.1 mg/dL (ref 8.4–10.5)
Chloride: 107 mEq/L (ref 96–112)
Creatinine, Ser: 1.89 mg/dL — ABNORMAL HIGH (ref 0.40–1.50)
GFR: 35.66 mL/min — ABNORMAL LOW (ref >60.00)
Glucose, Bld: 116 mg/dL — ABNORMAL HIGH (ref 70–99)
Potassium: 4.7 mEq/L (ref 3.5–5.1)
Sodium: 141 mEq/L (ref 135–145)

## 2022-05-29 MED ORDER — FREESTYLE LIBRE 2 READER DEVI: 1.0000 | 0 refills | Status: DC

## 2022-05-29 MED ORDER — FREESTYLE LIBRE 3 SENSOR MISC: 1.0000 | 11 refills | Status: DC

## 2022-05-29 MED ORDER — METOPROLOL TARTRATE 25 MG PO TABS: 12.5000 mg | ORAL_TABLET | Freq: Two times a day (BID) | ORAL | 1 refills | Status: DC

## 2022-05-29 MED ORDER — DAPAGLIFLOZIN PROPANEDIOL 5 MG PO TABS: 5.0000 mg | ORAL_TABLET | Freq: Every day | ORAL | 1 refills | Status: DC

## 2022-05-29 NOTE — Patient Instructions (Addendum)
Prescription for metoprolol 12.5 mg twice a day was sent to the pharmacy.  You will need to break the 25 mg tablet in half.  This is for your blood pressure.  Continue taking your other medications including diltiazem.  As the hydralazine does not appear to be helping decrease the dose to 1 pill 3 times a day for the next wk then stop taking it.  A prescription for Farxiga 5 mg was sent to your pharmacy.  This helps with blood sugar.  It may also help with your blood pressure.  Given your renal function we need to continue monitoring it closely as we may have to make further adjustments/stop the medication for worsening labs.  As Wilder Glade helps with your blood sugar we will need to decrease your dose of insulin to 14 units in a.m. and 18 units in pm until we see how your body adjusts to the new medication.  If we need to increase the insulin later we can.

## 2022-05-29 NOTE — Progress Notes (Signed)
Subjective:    Patient ID: [name], male    DOB: August 06, 1951, 71 y.o.   MRN: [id number]  Chief Complaint  Patient presents with   Follow-up   Hypertension    HPI Patient was seen today for f/u.  Pt states bp seems higher since being on hydralazine.  Seen by Dr. Martinique 05/16/22, hydralazine increased to 20 mg TID.  Also taking diltiazem 360 mg.  Pt denies symptoms.  Has nephrology appt next wk.  Creatinine elevation noted over the last few months.  BS was 143 yesterday am.  Pt taking Novolin 70/30 24 units in am and 20 units in pm.  A1c 7.7% on 01/06/2022.  Elevation in microalbumin creatinine ratio noted on 04/10/22.  Pt states he is still not sleeping.  Trazodone 25-50 mg was not helpful.  Taking lipitor 40 mg.  Past Medical History:  Diagnosis Date   Diabetes mellitus without complication (HCC)    Insomnia    Prostate cancer ([place])     No Known Allergies  ROS General: Denies fever, chills, night sweats, changes in weight, changes in appetite +insomnia HEENT: Denies headaches, ear pain, changes in vision, rhinorrhea, sore throat CV: Denies CP, palpitations, SOB, orthopnea Pulm: Denies SOB, cough, wheezing GI: Denies abdominal pain, nausea, vomiting, diarrhea, constipation GU: Denies dysuria, hematuria, frequency Msk: Denies muscle cramps, joint pains Neuro: Denies weakness, numbness, tingling Skin: Denies rashes, bruising Psych: Denies depression, anxiety, hallucinations    Objective:    Blood pressure (!) 190/80, pulse 91, temperature 98.5 F (36.9 C), temperature source Oral, weight 150 lb 9.6 oz (68.3 kg), SpO2 96 %.  Gen. Pleasant, well-nourished, in no distress, normal affect   HEENT: [place]/AT, face symmetric, conjunctiva clear, no scleral icterus, PERRLA, EOMI, nares patent without drainage Lungs: no accessory muscle use, CTAB, no wheezes or rales Cardiovascular: RRR, no m/r/g, no peripheral edema Musculoskeletal: No deformities, no cyanosis or clubbing, normal  tone Neuro:  A&Ox3, CN II-XII intact, normal gait Skin:  Warm, no lesions/ rash, calloses on dorsum of b/l hands   Wt Readings from Last 3 Encounters:  05/29/22 150 lb 9.6 oz (68.3 kg)  05/16/22 151 lb 6 oz (68.7 kg)  05/05/22 155 lb (70.3 kg)    Lab Results  Component Value Date   WBC 4.9 05/16/2022   HGB 11.5 (L) 05/16/2022   HCT 33.0 (L) 05/16/2022   PLT 217.0 05/16/2022   GLUCOSE 36 (LL) 05/16/2022   CHOL 179 04/10/2022   TRIG 68.0 04/10/2022   HDL 53.70 04/10/2022   LDLCALC 111 (H) 04/10/2022   ALT 22 04/10/2022   AST 27 04/10/2022   NA 142 05/16/2022   K 4.5 05/16/2022   CL 108 05/16/2022   CREATININE 2.16 (H) 05/16/2022   BUN 38 (H) 05/16/2022   CO2 26 05/16/2022   TSH 1.35 04/10/2022   PSA 6.49 (H) 03/21/2020   HGBA1C 7.7 (A) 01/06/2022   MICROALBUR 113.6 (H) 04/10/2022    Assessment/Plan:  Other secondary hypertension  -asymptomatic -uncontrolled -will start metoprolol 12.5 mg BID -will have pt decrease hydralazine to 10 mg TID then stop next wk as pt concerned I is making bp higher. -continue diltiazem 360 mg  -starting farxiga which may also help decrease bp. -Renal artery u/s negative 05/16/21 -referral to HTN clinic - Plan: Ambulatory referral to [facility], dapagliflozin propanediol (FARXIGA) 5 MG TABS tablet, Basic metabolic panel, metoprolol tartrate (LOPRESSOR) 25 MG tablet  Type 2 diabetes mellitus with diabetic polyneuropathy, with long-term current  use of insulin (HCC) -Hgb A1C 7.7 on 01/06/22 -discussed r/b/a of Farxiga.  Given recent change in GFR will recheck BMP and start 5 mg dose.  If able will increase to 10 mg. -will decrease Novolin 70/30 to 14 units in am and 18 units in pm.  May need to further adjust dose based on response to [place] -rx for continuous glucose meter and reader sent to pharmacy  - Plan: dapagliflozin propanediol (FARXIGA) 5 MG TABS tablet, Continuous Blood Gluc Sensor (FREESTYLE LIBRE  3 SENSOR) MISC, Continuous Blood Gluc Receiver (FREESTYLE LIBRE 2 READER) DEVI  Mixed hyperlipidemia -continue lipitor 40 mg daily  Stage 3b chronic kidney disease (HCC)  -creat 2.16 and eGFR 30.39 -encouraged to keep Nephrology appt. - Plan: Basic metabolic panel  F/u in 2-3 wks  Grier Mitts, MD

## 2022-05-30 ENCOUNTER — Telehealth: Payer: Self-pay | Admitting: Family Medicine

## 2022-05-30 NOTE — Telephone Encounter (Signed)
Pt called irate and yelling, stating MD keeps prescribing medication he cannot afford and does not even work. Pt said he was prescribed 2 medications last time he was here and he was only able to get the  metoprolol tartrate (LOPRESSOR) 25 MG tablet Pt is asking for a new, more affordable prescription for both. Please advise. [phone]

## 2022-06-03 NOTE — Telephone Encounter (Signed)
Please advise patient while I understand his frustration with his bp, if he calls the office yelling and irate he will need to find a new provider.  While it is not my intent to prescribe medication that is not affordable I have no idea what your insurance plan is and what they are willing to pay for.  I would advise you to contact your insurance company and ask them for list of medications for blood pressure that they cover to help avoid further back-and-forth.  You may also find that various medications may be less expensive when prescribed as a 90-day supply and for which a mail order pharmacy is used.

## 2022-06-05 IMAGING — US US CAROTID DUPLEX BILAT
1 series · 13 of 24 positions shown · non-contrast
Comparison: None.

CLINICAL DATA: Asymmetric retinopathy suspicious for carotid
occlusion.

EXAM:
BILATERAL CAROTID DUPLEX ULTRASOUND
TECHNIQUE: Gray scale imaging, color Doppler and duplex ultrasound were
performed of bilateral carotid and vertebral arteries in the neck.

[Series 1: us carotid duplex bilat · 0.05mm/px · 13 of 61 slices shown]
[im 1/61]
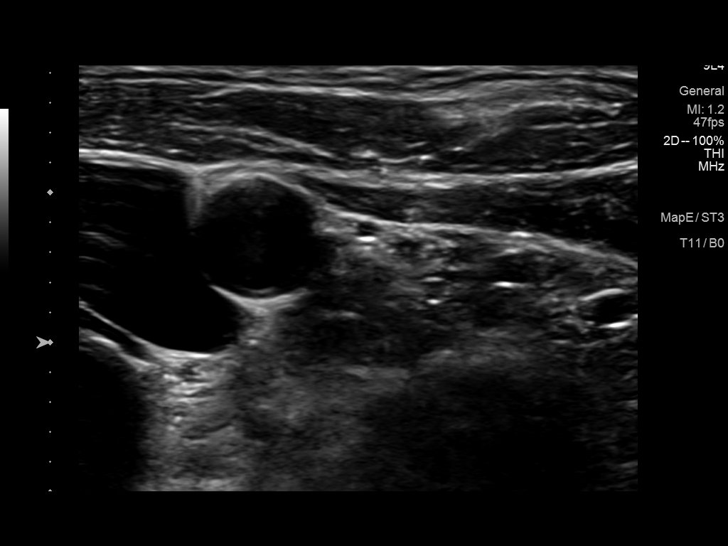
[im 6/61]
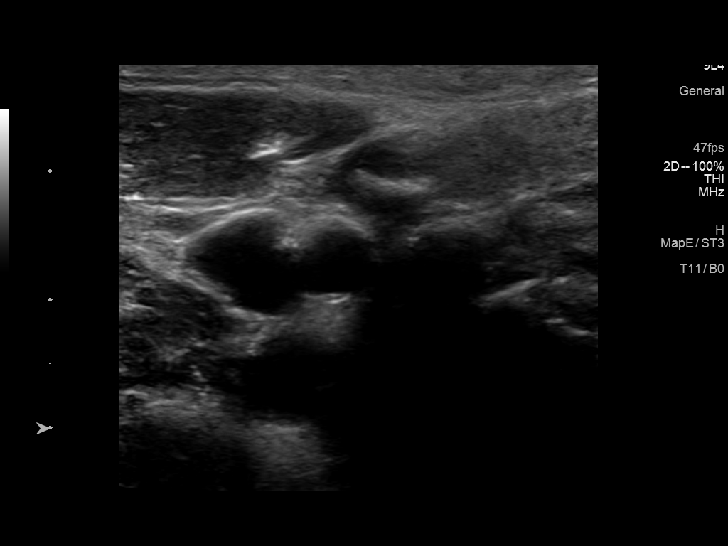
[im 11/61]
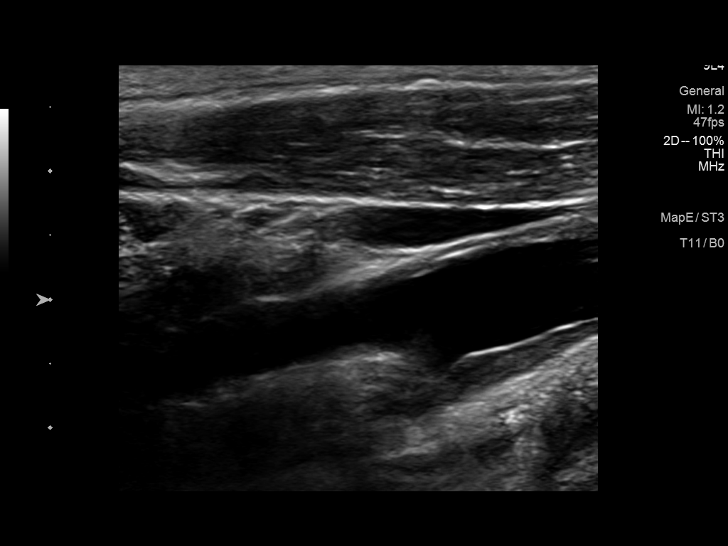
[im 16/61]
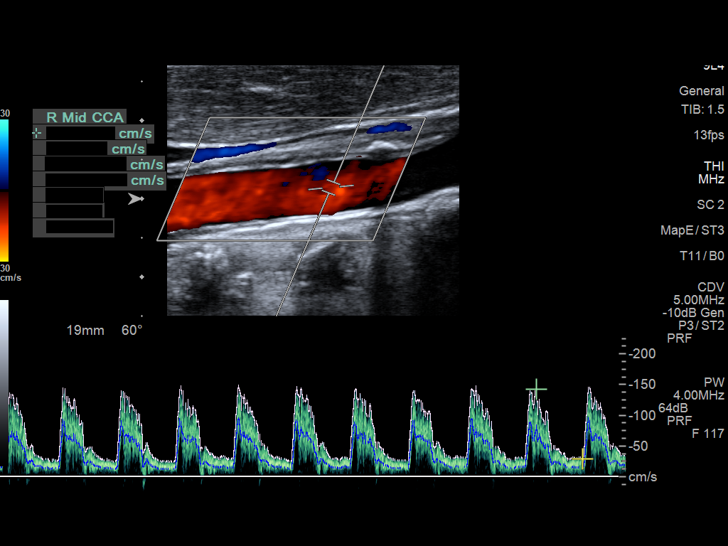
[im 21/61]
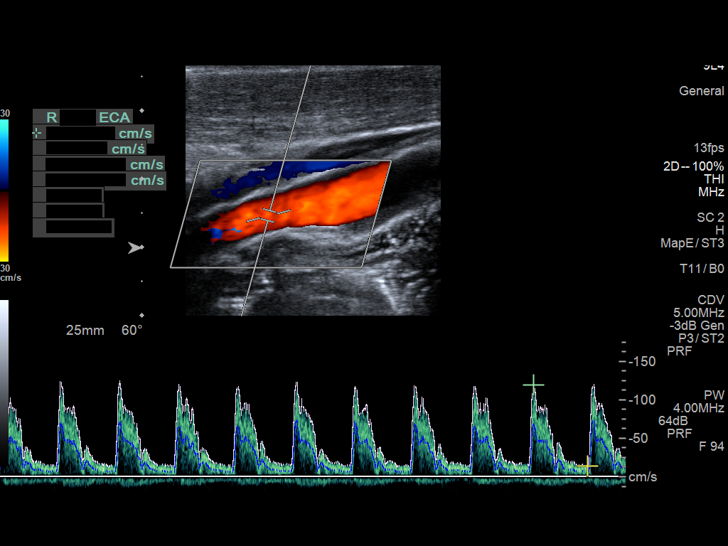
[im 27/61]
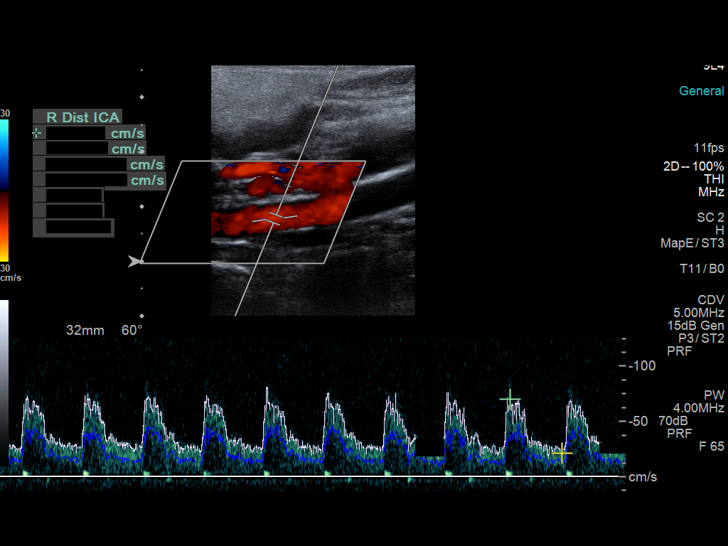
[im 32/61]
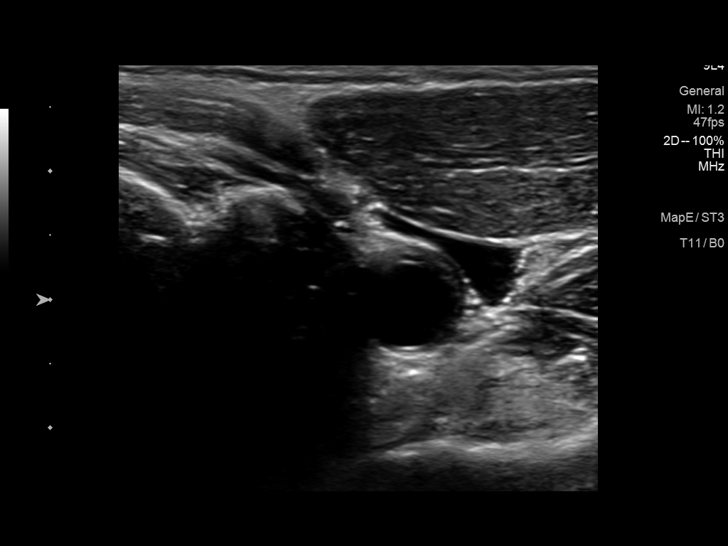
[im 34/61]
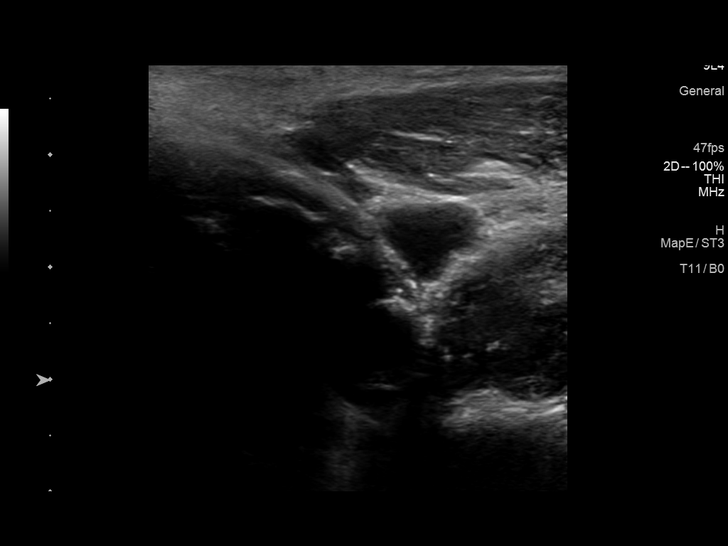
[im 40/61]
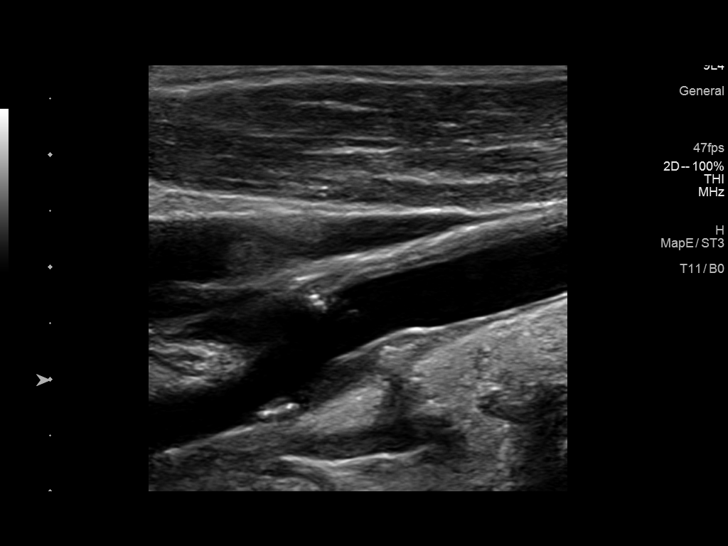
[im 45/61]
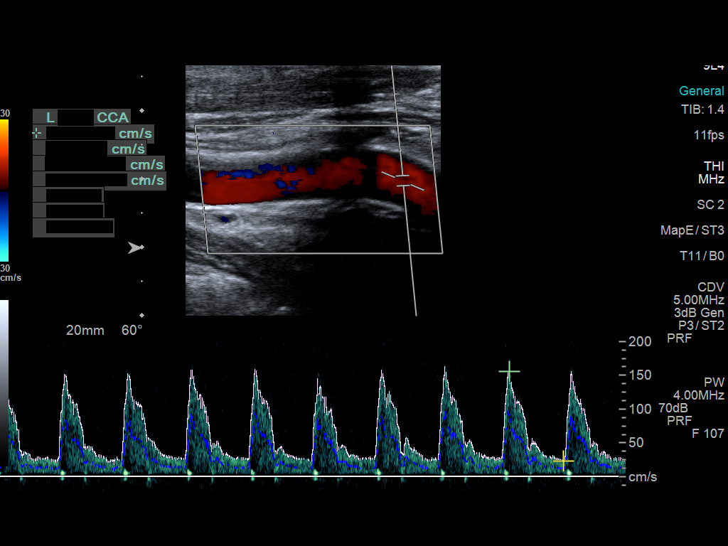
[im 50/61]
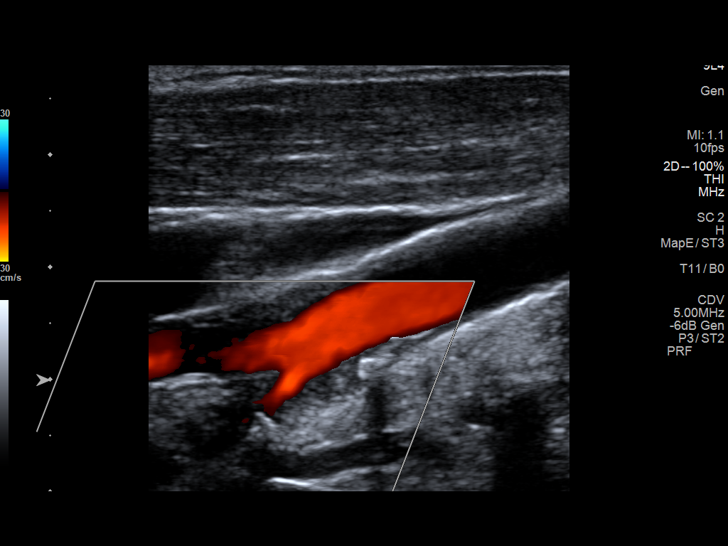
[im 55/61]
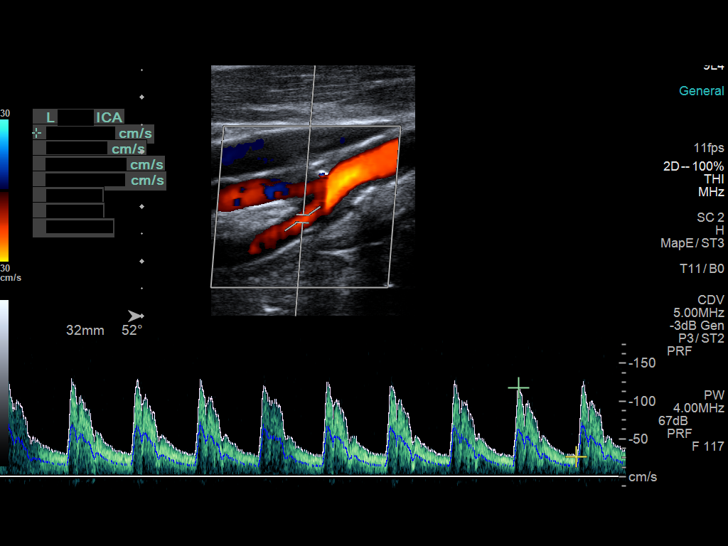
[im 61/61]
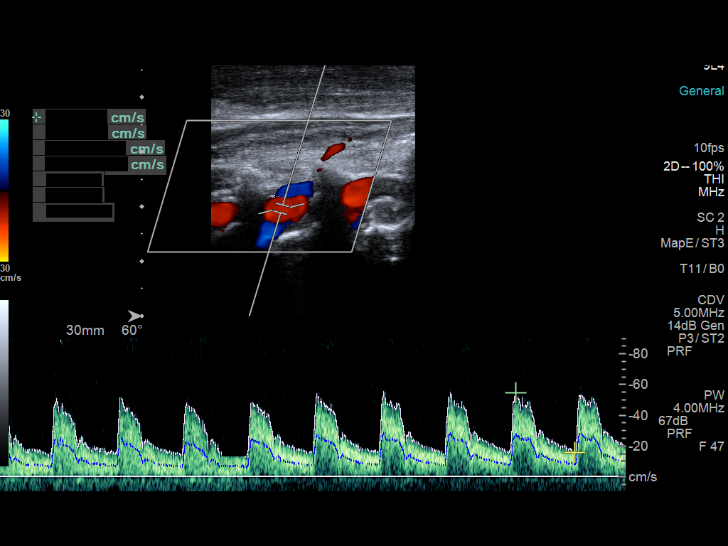

[13 of 24 positions shown; findings below may reference images not displayed]

FINDINGS: Criteria: Quantification of carotid stenosis is based on velocity
parameters that correlate the residual internal carotid diameter
with NASCET-based stenosis levels, using the diameter of the distal
internal carotid lumen as the denominator for stenosis measurement.

The following velocity measurements were obtained:

RIGHT

ICA: 104/19 cm/sec

CCA: 176/23 cm/sec

SYSTOLIC ICA/CCA RATIO:

ECA: 119 cm/sec

LEFT

ICA: 122/27 cm/sec

CCA: 156/23 cm/sec

SYSTOLIC ICA/CCA RATIO:

ECA: 128 cm/sec

RIGHT CAROTID ARTERY: There is a minimal amount of circumferential
hypoechoic plaque involving the distal aspect of the right common
carotid artery (image 4). There is a minimal amount of hypoechoic
plaque involving the origin and proximal aspects the right internal
carotid artery (image 13, not resulting in elevated peak systolic
velocities within the interrogated course of the right internal
carotid artery to suggest a hemodynamically significant stenosis

RIGHT VERTEBRAL ARTERY:  Antegrade flow

LEFT CAROTID ARTERY: There is a minimal amount of slightly eccentric
hypoechoic plaque involving the mid aspect the left common carotid
artery (image 32). There is a moderate amount of eccentric mixed
echogenic plaque involving the left carotid bulb (image 42),
extending to involve the origin and proximal aspects of the left
internal carotid artery which results in borderline elevated peak
systolic velocities involving the proximal mid aspects of the left
internal carotid artery. Greatest acquired peak systolic velocity
within the mid left ICA measures 122 centimeters/second (image 58).

LEFT VERTEBRAL ARTERY:  Antegrade Flow

Upper extremity blood pressures: RIGHT: 183/95 LEFT: 174/81
IMPRESSION: 1. Moderate amount of left-sided atherosclerotic plaque results in
borderline elevated peak systolic velocities within the left
internal carotid artery which approach the 50% luminal narrowing
range. Further evaluation with CTA could be performed as clinically
indicated.
2. Minimal amount of right-sided atherosclerotic plaque, not
resulting in a hemodynamically significant stenosis.
3. The patient was noted to be hypertensive at the time of this
examination.

## 2022-06-11 DIAGNOSIS — C61 Malignant neoplasm of prostate: Secondary | ICD-10-CM | POA: Diagnosis not present

## 2022-06-11 DIAGNOSIS — N1832 Chronic kidney disease, stage 3b: Secondary | ICD-10-CM | POA: Diagnosis not present

## 2022-06-11 DIAGNOSIS — D631 Anemia in chronic kidney disease: Secondary | ICD-10-CM | POA: Diagnosis not present

## 2022-06-11 DIAGNOSIS — N401 Enlarged prostate with lower urinary tract symptoms: Secondary | ICD-10-CM | POA: Diagnosis not present

## 2022-06-11 DIAGNOSIS — N189 Chronic kidney disease, unspecified: Secondary | ICD-10-CM | POA: Diagnosis not present

## 2022-06-11 DIAGNOSIS — R351 Nocturia: Secondary | ICD-10-CM | POA: Diagnosis not present

## 2022-06-11 DIAGNOSIS — E1122 Type 2 diabetes mellitus with diabetic chronic kidney disease: Secondary | ICD-10-CM | POA: Diagnosis not present

## 2022-06-11 DIAGNOSIS — I129 Hypertensive chronic kidney disease with stage 1 through stage 4 chronic kidney disease, or unspecified chronic kidney disease: Secondary | ICD-10-CM | POA: Diagnosis not present

## 2022-06-11 DIAGNOSIS — R809 Proteinuria, unspecified: Secondary | ICD-10-CM | POA: Diagnosis not present

## 2022-06-11 DIAGNOSIS — E785 Hyperlipidemia, unspecified: Secondary | ICD-10-CM | POA: Diagnosis not present

## 2022-06-12 ENCOUNTER — Ambulatory Visit (INDEPENDENT_AMBULATORY_CARE_PROVIDER_SITE_OTHER): Payer: Medicare HMO | Admitting: Family Medicine

## 2022-06-12 ENCOUNTER — Encounter: Payer: Self-pay | Admitting: Family Medicine

## 2022-06-12 VITALS — BP 188/106 | HR 77 | Temp 98.2°F | Wt 150.6 lb

## 2022-06-12 DIAGNOSIS — N1832 Chronic kidney disease, stage 3b: Secondary | ICD-10-CM | POA: Diagnosis not present

## 2022-06-12 DIAGNOSIS — I158 Other secondary hypertension: Secondary | ICD-10-CM

## 2022-06-12 DIAGNOSIS — E1142 Type 2 diabetes mellitus with diabetic polyneuropathy: Secondary | ICD-10-CM | POA: Diagnosis not present

## 2022-06-12 LAB — POCT GLYCOSYLATED HEMOGLOBIN (HGB A1C): Hemoglobin A1C: 6.5 % — AB (ref 4.0–5.6)

## 2022-06-12 NOTE — Progress Notes (Signed)
Subjective:    Patient ID: [name], male    DOB: 08-25-1951, 71 y.o.   MRN: [id number]  Chief Complaint  Patient presents with   Follow-up    Medications.  Kidney dr gave news yday of kidney only working 35% and meds are causing the problems. DID INFORMD OF NOTE FROM CHART OF MED COST    HPI Patient was seen today for follow-up.  Patient seen by nephrology yesterday was advised kidneys working at 35%.  BP meds increased.  BP at nephrology appointment 186/90.  Pt has yet to pick up prescriptions for metoprolol 25 mg twice daily, hydralazine 50 mg 3 times daily, Norvasc 5 mg.  Patient states he was told his doses of medication were " not enough for a baby".  Patient reminded that meds are being changed per his preference.  Patient feels like the medications are what are affecting his kidneys.  Patient intermittently taking an OTC supplement for BP.  Unsure what is in it.  Patient states he is drinking plenty of water and does not like a lot of salt.  Patient mentions having a [employer] frozen dinner of meatloaf and chicken last night.   Patient states blood sugar at home has been "good".  States yesterday am blood sugar was 165.  Past Medical History:  Diagnosis Date   Diabetes mellitus without complication (HCC)    Insomnia    Prostate cancer ([place])     No Known Allergies  ROS General: Denies fever, chills, night sweats, changes in weight, changes in appetite HEENT: Denies headaches, ear pain, changes in vision, rhinorrhea, sore throat CV: Denies CP, palpitations, SOB, orthopnea Pulm: Denies SOB, cough, wheezing GI: Denies abdominal pain, nausea, vomiting, diarrhea, constipation GU: Denies dysuria, hematuria, frequencyMsk: Denies muscle cramps, joint pains Neuro: Denies weakness, numbness, tingling Skin: Denies rashes, bruising Psych: Denies depression, anxiety, hallucinations     Objective:    Blood pressure (!) 188/106, pulse 77, temperature 98.2 F (36.8 C),  temperature source Oral, weight 150 lb 9.6 oz (68.3 kg), SpO2 98 %.  Gen. Pleasant, well-nourished, in no distress, normal affect   HEENT: [place]/AT, face symmetric, conjunctiva clear, no scleral icterus, PERRLA, EOMI, nares patent without drainage Lungs: no accessory muscle use, CTAB, no wheezes or rales Cardiovascular: RRR, no m/r/g, no peripheral edema Musculoskeletal: No deformities, no cyanosis or clubbing, normal tone Neuro:  A&Ox3, CN II-XII intact, normal gait Skin:  Warm, no lesions/ rash   Wt Readings from Last 3 Encounters:  05/29/22 150 lb 9.6 oz (68.3 kg)  05/16/22 151 lb 6 oz (68.7 kg)  05/05/22 155 lb (70.3 kg)    Lab Results  Component Value Date   WBC 4.9 05/16/2022   HGB 11.5 (L) 05/16/2022   HCT 33.0 (L) 05/16/2022   PLT 217.0 05/16/2022   GLUCOSE 116 (H) 05/29/2022   CHOL 179 04/10/2022   TRIG 68.0 04/10/2022   HDL 53.70 04/10/2022   LDLCALC 111 (H) 04/10/2022   ALT 22 04/10/2022   AST 27 04/10/2022   NA 141 05/29/2022   K 4.7 05/29/2022   CL 107 05/29/2022   CREATININE 1.89 (H) 05/29/2022   BUN 31 (H) 05/29/2022   CO2 26 05/29/2022   TSH 1.35 04/10/2022   PSA 6.49 (H) 03/21/2020   HGBA1C 7.7 (A) 01/06/2022   MICROALBUR 113.6 (H) 04/10/2022    Assessment/Plan:  Other secondary hypertension -uncontrolled. -Renal artery ultrasound negative 05/16/2021 -Discussed the importance of lifestyle modifications.  Advised to avoid frozen meals due to  sodium content.  The frozen meal pt had last night for dinner contains 2300 mg of sodium. -Patient advised to pick up medications from pharmacy and start taking them.  Advised to work with patient to find affordable medication options provider has no way of knowing until patient picks up prescription. -Patient also advised to give medications a chance which means increasing the dose as needed. -Start Norvasc 5 mg daily, metoprolol tartrate 25 mg twice daily, hydralazine 50 mg 3 times daily -Patient encouraged to keep  follow-up appointment with cardiology as hypertension, as well as nephrology.  Type 2 diabetes mellitus with diabetic polyneuropathy, without long-term current use of insulin (HCC) -Hemoglobin A1c 7.7% on 01/06/2022 -Continue Farxiga 5 mg daily, Novolin 70/30 14 units in a.m. and 18 units in p.m. - Plan: POCT glycosylated hemoglobin (Hb A1C)  Chronic kidney disease stage IIIb (HCC) -Creatinine 1.89 05/29/2022 with GFR 35.66 -Avoid nephrotoxic meds -renally dose medications -Continue follow-up with nephrology  F/u in 3-4 wks  Grier Mitts, MD

## 2022-06-12 NOTE — Telephone Encounter (Signed)
Informed pt at visit today .

## 2022-06-13 ENCOUNTER — Other Ambulatory Visit: Payer: Self-pay | Admitting: Family Medicine

## 2022-06-13 DIAGNOSIS — E1165 Type 2 diabetes mellitus with hyperglycemia: Secondary | ICD-10-CM

## 2022-06-13 DIAGNOSIS — I10 Essential (primary) hypertension: Secondary | ICD-10-CM

## 2022-06-13 DIAGNOSIS — E1169 Type 2 diabetes mellitus with other specified complication: Secondary | ICD-10-CM

## 2022-06-25 DIAGNOSIS — I129 Hypertensive chronic kidney disease with stage 1 through stage 4 chronic kidney disease, or unspecified chronic kidney disease: Secondary | ICD-10-CM | POA: Diagnosis not present

## 2022-07-03 ENCOUNTER — Encounter (HOSPITAL_BASED_OUTPATIENT_CLINIC_OR_DEPARTMENT_OTHER): Payer: Self-pay | Admitting: Family

## 2022-07-03 ENCOUNTER — Ambulatory Visit (HOSPITAL_BASED_OUTPATIENT_CLINIC_OR_DEPARTMENT_OTHER): Payer: Medicare HMO | Admitting: Family

## 2022-07-03 VITALS — BP 175/85 | HR 71 | Ht 66.5 in | Wt 157.0 lb

## 2022-07-03 DIAGNOSIS — I10 Essential (primary) hypertension: Secondary | ICD-10-CM | POA: Diagnosis not present

## 2022-07-03 DIAGNOSIS — Z006 Encounter for examination for normal comparison and control in clinical research program: Secondary | ICD-10-CM

## 2022-07-03 DIAGNOSIS — E1165 Type 2 diabetes mellitus with hyperglycemia: Secondary | ICD-10-CM | POA: Diagnosis not present

## 2022-07-03 DIAGNOSIS — N1832 Chronic kidney disease, stage 3b: Secondary | ICD-10-CM

## 2022-07-03 DIAGNOSIS — Z794 Long term (current) use of insulin: Secondary | ICD-10-CM | POA: Diagnosis not present

## 2022-07-03 DIAGNOSIS — I158 Other secondary hypertension: Secondary | ICD-10-CM

## 2022-07-03 DIAGNOSIS — I6523 Occlusion and stenosis of bilateral carotid arteries: Secondary | ICD-10-CM

## 2022-07-03 DIAGNOSIS — R011 Cardiac murmur, unspecified: Secondary | ICD-10-CM

## 2022-07-03 MED ORDER — DILTIAZEM HCL ER COATED BEADS 360 MG PO CP24: 360.0000 mg | ORAL_CAPSULE | Freq: Every day | ORAL | 3 refills | Status: DC

## 2022-07-03 MED ORDER — HYDRALAZINE HCL 100 MG PO TABS: 100.0000 mg | ORAL_TABLET | Freq: Three times a day (TID) | ORAL | 3 refills | Status: DC

## 2022-07-03 MED ORDER — METOPROLOL TARTRATE 25 MG PO TABS: 25.0000 mg | ORAL_TABLET | Freq: Two times a day (BID) | ORAL | 3 refills | Status: DC

## 2022-07-03 NOTE — Patient Instructions (Addendum)
Medication Instructions:  Your physician has recommended you make the following change in your medication:   CONTINUE: Diltiazem '360mg'$  daily   INCREASE: Metoprolol Tartrate '25mg'$  tablet twice daily  INCREASE: Hydralazine  '100mg'$  three times daily    Labwork: Please return for Lab work within the next week or so for Catecholamines, Metanephrines, Cortisol, Renin Aldosterone. You may come to the...   [facility] (3rd floor) [address] [place], [place] [address]  Open: 8am-Noon and 1pm-4:30pm  Please ring the doorbell on the small table when you exit the elevator and the Lab Tech will come get you  [place] [facility] [address] [place], [place], [place] [address] Open: 8am-1pm, then 2pm-4:30pm   [place]- Please see attached locations sheet stapled to your lab work with address and hours.     Testing/Procedures: Your physician has requesteFDDFd that you have an echocardiogram. Echocardiography is a painless test that uses sound waves to create images of your heart. It provides your doctor with information about the size and shape of your heart and how well your heart's chambers and valves are working. This procedure takes approximately one hour. There are no restrictions for this procedure. [place]: 2 month follow up:  09/04/22 at 10:55 AM  4 month follow: 11/03/22 at 11:00 AM    Referrals:  The PREP team will reach out to you to get started!    Special Instructions:  DASH Eating Plan DASH stands for Dietary Approaches to Stop Hypertension. The DASH eating plan is a healthy eating plan that has been shown to: Reduce high blood pressure (hypertension). Reduce your risk for type 2 diabetes, heart disease, and stroke. Help with weight loss. What are tips for following this plan? Reading food labels Check food labels for the amount of salt (sodium) per serving. Choose foods with less than 5  percent of the Daily Value of sodium. Generally, foods with less than 300 milligrams (mg) of sodium per serving fit into this eating plan. To find whole grains, look for the word "whole" as the first word in the ingredient list. Shopping Buy products labeled as "low-sodium" or "no salt added." Buy fresh foods. Avoid canned foods and pre-made or frozen meals. Cooking Avoid adding salt when cooking. Use salt-free seasonings or herbs instead of table salt or sea salt. Check with your health care provider or pharmacist before using salt substitutes. Do not fry foods. Cook foods using healthy methods such as baking, boiling, grilling, roasting, and broiling instead. Cook with heart-healthy oils, such as olive, canola, avocado, soybean, or sunflower oil. Meal planning  Eat a balanced diet that includes: 4 or more servings of fruits and 4 or more servings of vegetables each day. Try to fill one-half of your plate with fruits and vegetables. 6-8 servings of whole grains each day. Less than 6 oz (170 g) of lean meat, poultry, or fish each day. A 3-oz (85-g) serving of meat is about the same size as a deck of cards. One egg equals 1 oz (28 g). 2-3 servings of low-fat dairy each day. One serving is 1 cup (237 mL). 1 serving of nuts, seeds, or beans 5 times each week. 2-3 servings of heart-healthy fats. Healthy fats called omega-3 fatty acids are found in foods such as walnuts, flaxseeds, fortified milks, and eggs. These fats are also found in cold-water fish, such as sardines, salmon, and mackerel. Limit how much you eat of: Canned  or prepackaged foods. Food that is high in trans fat, such as some fried foods. Food that is high in saturated fat, such as fatty meat. Desserts and other sweets, sugary drinks, and other foods with added sugar. Full-fat dairy products. Do not salt foods before eating. Do not eat more than 4 egg yolks a week. Try to eat at least 2 vegetarian meals a week. Eat more  home-cooked food and less restaurant, buffet, and fast food. Lifestyle When eating at a restaurant, ask that your food be prepared with less salt or no salt, if possible. If you drink alcohol: Limit how much you use to: 0-1 drink a day for women who are not pregnant. 0-2 drinks a day for men. Be aware of how much alcohol is in your drink. In the [place] one drink equals one 12 oz bottle of beer (355 mL), one 5 oz glass of wine (148 mL), or one 1 oz glass of hard liquor (44 mL). General information Avoid eating more than 2,300 mg of salt a day. If you have hypertension, you may need to reduce your sodium intake to 1,500 mg a day. Work with your health care provider to maintain a healthy body weight or to lose weight. Ask what an ideal weight is for you. Get at least 30 minutes of exercise that causes your heart to beat faster (aerobic exercise) most days of the week. Activities may include walking, swimming, or biking. Work with your health care provider or dietitian to adjust your eating plan to your individual calorie needs. What foods should I eat? Fruits All fresh, dried, or frozen fruit. Canned fruit in natural juice (without added sugar). Vegetables Fresh or frozen vegetables (raw, steamed, roasted, or grilled). Low-sodium or reduced-sodium tomato and vegetable juice. Low-sodium or reduced-sodium tomato sauce and tomato paste. Low-sodium or reduced-sodium canned vegetables. Grains Whole-grain or whole-wheat bread. Whole-grain or whole-wheat pasta. Brown rice. [place]. Bulgur. Whole-grain and low-sodium cereals. Pita bread. Low-fat, low-sodium crackers. Whole-wheat flour tortillas. Meats and other proteins Skinless chicken or [place]. Ground chicken or [place]. Pork with fat trimmed off. Fish and seafood. Egg whites. Dried beans, peas, or lentils. Unsalted nuts, nut butters, and seeds. Unsalted canned beans. Lean cuts of beef with fat trimmed off. Low-sodium, lean precooked or cured  meat, such as sausages or meat loaves. Dairy Low-fat (1%) or fat-free (skim) milk. Reduced-fat, low-fat, or fat-free cheeses. Nonfat, low-sodium ricotta or cottage cheese. Low-fat or nonfat yogurt. Low-fat, low-sodium cheese. Fats and oils Soft margarine without trans fats. Vegetable oil. Reduced-fat, low-fat, or light mayonnaise and salad dressings (reduced-sodium). Canola, safflower, olive, avocado, soybean, and sunflower oils. Avocado. Seasonings and condiments Herbs. Spices. Seasoning mixes without salt. Other foods Unsalted popcorn and pretzels. Fat-free sweets. The items listed above may not be a complete list of foods and beverages you can eat. Contact a dietitian for more information. What foods should I avoid? Fruits Canned fruit in a light or heavy syrup. Fried fruit. Fruit in cream or butter sauce. Vegetables Creamed or fried vegetables. Vegetables in a cheese sauce. Regular canned vegetables (not low-sodium or reduced-sodium). Regular canned tomato sauce and paste (not low-sodium or reduced-sodium). Regular tomato and vegetable juice (not low-sodium or reduced-sodium). Angie Fava. Olives. Grains Baked goods made with fat, such as croissants, muffins, or some breads. Dry pasta or rice meal packs. Meats and other proteins Fatty cuts of meat. Ribs. Fried meat. Berniece Salines. Bologna, salami, and other precooked or cured meats, such as sausages or meat loaves. Fat from the  back of a pig (fatback). Bratwurst. Salted nuts and seeds. Canned beans with added salt. Canned or smoked fish. Whole eggs or egg yolks. Chicken or [place] with skin. Dairy Whole or 2% milk, cream, and half-and-half. Whole or full-fat cream cheese. Whole-fat or sweetened yogurt. Full-fat cheese. Nondairy creamers. Whipped toppings. Processed cheese and cheese spreads. Fats and oils Butter. Stick margarine. Lard. Shortening. Ghee. Bacon fat. Tropical oils, such as coconut, palm kernel, or palm oil. Seasonings and  condiments Onion salt, garlic salt, seasoned salt, table salt, and sea salt. Worcestershire sauce. Tartar sauce. Barbecue sauce. Teriyaki sauce. Soy sauce, including reduced-sodium. Steak sauce. Canned and packaged gravies. Fish sauce. Oyster sauce. Cocktail sauce. Store-bought horseradish. Ketchup. Mustard. Meat flavorings and tenderizers. Bouillon cubes. Hot sauces. Pre-made or packaged marinades. Pre-made or packaged taco seasonings. Relishes. Regular salad dressings. Other foods Salted popcorn and pretzels. The items listed above may not be a complete list of foods and beverages you should avoid. Contact a dietitian for more information. Where to find more information National Heart, Lung, and Blood Institute: https://wilson-eaton.com/ American Heart Association: www.heart.org Academy of Nutrition and Dietetics: www.eatright.[place]: www.kidney.org Summary The DASH eating plan is a healthy eating plan that has been shown to reduce high blood pressure (hypertension). It may also reduce your risk for type 2 diabetes, heart disease, and stroke. When on the DASH eating plan, aim to eat more fresh fruits and vegetables, whole grains, lean proteins, low-fat dairy, and heart-healthy fats. With the DASH eating plan, you should limit salt (sodium) intake to 2,300 mg a day. If you have hypertension, you may need to reduce your sodium intake to 1,500 mg a day. Work with your health care provider or dietitian to adjust your eating plan to your individual calorie needs. This information is not intended to replace advice given to you by your health care provider. Make sure you discuss any questions you have with your health care provider. Document Revised: 09/16/2019 Document Reviewed: 09/16/2019 Elsevier Patient Education  [place].

## 2022-07-03 NOTE — Progress Notes (Unsigned)
Advanced Hypertension Clinic Initial Assessment:    Date:  07/03/2022   ID:  [name], DOB 1950-11-15, MRN [id number]  PCP:  [name], MD  Cardiologist:  None  Nephrologist:  Referring MD: [name], MD   CC: Hypertension  History of Present Illness:    [name] is a 71 y.o. male with a hx of DM2, HLD, HTN, CKD*** here to establish care in the Advanced Hypertension Clinic.    [name] was diagnosed with hypertension 4-5 years ago. It has been difficult to control. Blood pressure checked with arm cuff at home. Readings have been 180s. Home BP cuff has been checked for accuracy.  he reports tobacco use never. Alcohol use ***. For exercise he ***. he eats at home and outside of the home. 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   "She really messed up"  12/2021 7.7  SW   Reports no shortness of breath nor dyspnea on exertion. Reports no chest pain, pressure, or tightness. No edema, orthopnea, PND. Reports no palpitations.  He previously had dizziness on Metformin which has not recurred.   Previous antihypertensives:  Secondary Causes of Hypertension  Medications/Herbal: OCP, steroids, stimulants, antidepressants, weight loss medication, immune suppressants, NSAIDs, sympathomimetics, alcohol, caffeine, licorice, ginseng, St. John's wort, chemo  Sleep Apnea no Renal artery stenosis no Hyperaldosteronism - renin- Hyper/hypothyroidism - no Pheochromocytoma: palpitations, tachycardia, headache, diaphoresis (plasma metanephrines) Cushing's syndrome: Cushingoid facies, central obesity, proximal muscle weakness, and ecchymoses, adrenal incidentaloma (cortisol) Coarctation of the aorta  Past Medical History:  Diagnosis Date   Diabetes mellitus without complication (HCC)    Insomnia    Prostate cancer ([place])     Past  Surgical History:  Procedure Laterality Date   PROSTATE BIOPSY      Current Medications: Current Meds  Medication Sig   atorvastatin (LIPITOR) 40 MG tablet TAKE 1 TABLET BY MOUTH EVERY DAY   blood glucose meter kit and supplies Dispense based on patient and insurance preference. Use to check glucose daily (FOR ICD-10 E10.9, E11.9).   Continuous Blood Gluc Receiver (FREESTYLE LIBRE 2 READER) DEVI 1 Device by Does not apply route as directed.   Continuous Blood Gluc Sensor (FREESTYLE LIBRE 3 SENSOR) MISC 1 Device by Does not apply route every 14 (fourteen) days. Place 1 sensor on the skin every 14 days. Use to check glucose continuously   dapagliflozin propanediol (FARXIGA) 5 MG TABS tablet Take 1 tablet (5 mg total) by mouth daily before breakfast.   diltiazem (MATZIM LA) 360 MG 24 hr tablet Take 1 tablet (360 mg total) by mouth daily.   ferrous gluconate (FERGON) 324 MG tablet TAKE 1 TABLET BY MOUTH DAILY WITH BREAKFAST   hydrALAZINE (APRESOLINE) 10 MG tablet TAKE 1 TABLET BY MOUTH THREE TIMES A DAY   Insulin Pen Needle (BD PEN NEEDLE NANO 2ND GEN) 32G X 4 MM MISC To use with insulin pen twice a day.   Lancet Devices (ONE TOUCH DELICA LANCING DEV) MISC 1 Device by Does not apply route as directed.   latanoprost (XALATAN) 0.005 % ophthalmic solution SMARTSIG:In Eye(s)   metoprolol tartrate (LOPRESSOR) 25 MG tablet Take 0.5 tablets (12.5 mg total) by mouth 2 (two) times daily.   NOVOLIN 70/30 KWIKPEN (70-30) 100 UNIT/ML KwikPen INJECT 20 UNITS INTO THE SKIN DAILY BEFORE BREAKFAST AND 24 UNITS AT BEDTIME.   OneTouch  Delica Lancets 25E MISC 1 Device by Does not apply route as directed. Use to test up to 4x daily.   ONETOUCH ULTRA test strip USE UP TO 4X DAILY     Allergies:   Patient has no known allergies.   Social History   Socioeconomic History   Marital status: Single    Spouse name: Not on file   Number of children: 2   Years of education: Not on file   Highest education level: Not  on file  Occupational History    Comment: retired  Tobacco Use   Smoking status: Never   Smokeless tobacco: Never  Vaping Use   Vaping Use: Never used  Substance and Sexual Activity   Alcohol use: No   Drug use: No   Sexual activity: Yes  Other Topics Concern   Not on file  Social History Narrative   Not on file   Social Determinants of Health   Financial Resource Strain: Low Risk  (05/05/2022)   Overall Financial Resource Strain (CARDIA)    Difficulty of Paying Living Expenses: Not hard at all  Food Insecurity: No Food Insecurity (07/03/2022)   Hunger Vital Sign    Worried About Running Out of Food in the Last Year: Never true    [place] in the Last Year: Never true  Transportation Needs: No Transportation Needs (05/05/2022)   PRAPARE - Hydrologist (Medical): No    Lack of Transportation (Non-Medical): No  Physical Activity: Inactive (05/05/2022)   Exercise Vital Sign    Days of Exercise per Week: 0 days    Minutes of Exercise per Session: 0 min  Stress: No Stress Concern Present (05/05/2022)   [place]    Feeling of Stress : Not at all  Social Connections: Socially Isolated (04/17/2021)   Social Connection and Isolation Panel [NHANES]    Frequency of Communication with Friends and Family: Twice a week    Frequency of Social Gatherings with Friends and Family: Twice a week    Attends Religious Services: Never    Marine scientist or Organizations: No    Attends Music therapist: Never    Marital Status: Divorced     Family History: The patient's ***family history includes Colon cancer in his mother; Diabetes in his brother; Hypertension in his mother. There is no history of Breast cancer, Pancreatic cancer, or Prostate cancer.  ROS:   Please see the history of present illness.    *** All other systems reviewed and are negative.  EKGs/Labs/Other  Studies Reviewed:    EKG:  EKG is  ordered today.  The ekg ordered today demonstrates NSR 71 bpm with no acute ST/T wave changes.   Recent Labs: 04/10/2022: ALT 22; TSH 1.35 05/16/2022: Hemoglobin 11.5; Platelets 217.0 05/29/2022: BUN 31; Creatinine, Ser 1.89; Potassium 4.7; Sodium 141   Recent Lipid Panel    Component Value Date/Time   CHOL 179 04/10/2022 1014   TRIG 68.0 04/10/2022 1014   HDL 53.70 04/10/2022 1014   CHOLHDL 3 04/10/2022 1014   VLDL 13.6 04/10/2022 1014   LDLCALC 111 (H) 04/10/2022 1014   LDLCALC 70 09/07/2020 1038    Physical Exam:   VS:  BP (!) 175/85 Comment: right arm  Pulse 71   Ht 5' 6.5" (1.689 m)   Wt 157 lb (71.2 kg)   BMI 24.96 kg/m  , BMI Body mass index is 24.96 kg/m.  GENERAL:  Well appearing HEENT: Pupils equal round and reactive, fundi not visualized, oral mucosa unremarkable NECK:  No jugular venous distention, waveform within normal limits, carotid upstroke brisk and symmetric, no bruits, no thyromegaly LYMPHATICS:  No cervical adenopathy LUNGS:  Clear to auscultation bilaterally HEART:  RRR.  PMI not displaced or sustained,S1 and S2 within normal limits, no S3, no S4, no clicks, no rubs, *** murmurs ABD:  Flat, positive bowel sounds normal in frequency in pitch, no bruits, no rebound, no guarding, no midline pulsatile mass, no hepatomegaly, no splenomegaly EXT:  2 plus pulses throughout, no edema, no cyanosis no clubbing SKIN:  No rashes no nodules NEURO:  Cranial nerves II through XII grossly intact, motor grossly intact throughout PSYCH:  Cognitively intact, oriented to person place and time   ASSESSMENT/PLAN:    No problem-specific Assessment & Plan notes found for this encounter.   Screening for Secondary Hypertension: { Click here to document screening for secondary causes of HTN  :[id number]}    Relevant Labs/Studies:    Latest Ref Rng & Units 05/29/2022    9:26 AM 05/16/2022    7:41 AM 05/09/2022    9:57 AM  Basic Labs  Sodium  135 - 145 mEq/L 141  142  141   Potassium 3.5 - 5.1 mEq/L 4.7  4.5  5.1   Creatinine 0.40 - 1.50 mg/dL 1.89  2.16  2.19        Latest Ref Rng & Units 04/10/2022   10:14 AM 09/07/2020   10:38 AM  Thyroid   TSH 0.35 - 5.50 uIU/mL 1.35  1.01                     he consents to be monitored in our remote patient monitoring program through [place].  he will track his blood pressure twice daily and understands that these trends will help [place] to adjust his medications as needed prior to his next appointment.  he *** interested in enrolling in the PREP exercise and nutrition program through the [facility].     Disposition:    FU with MD/PharmD in {gen number [id number]} {Days to years:10300}    Medication Adjustments/Labs and Tests Ordered: Current medicines are reviewed at length with the patient today.  Concerns regarding medicines are outlined above.  No orders of the defined types were placed in this encounter.  No orders of the defined types were placed in this encounter.    Signed, Loel Dubonnet, NP  07/03/2022 11:17 AM    [place]

## 2022-07-03 NOTE — Research (Signed)
  Subject Name: [name] met inclusion and exclusion criteria for the Virtual Care and Social Determinant Interventions for the management of hypertension trial.  The informed consent form, study requirements and expectations were reviewed with the subject by Dr. Oval Linsey and myself. The subject was given the opportunity to read the consent and ask questions. The subject verbalized understanding of the trial requirements.  All questions were addressed prior to the signing of the consent form. The subject agreed to participate in the trial and signed the informed consent. The informed consent was obtained prior to performance of any protocol-specific procedures for the subject.  A copy of the signed informed consent was given to the subject and a copy was placed in the subject's medical record.  [name] was randomized to Group 2.

## 2022-07-04 ENCOUNTER — Telehealth: Payer: Self-pay

## 2022-07-04 NOTE — Telephone Encounter (Signed)
Call to pt reference PREP referral  Explained program to pt At this time, patient doesn't feel as though he needs to do participate  Asked that if he changes his mind to give me a call.

## 2022-07-07 ENCOUNTER — Encounter (HOSPITAL_BASED_OUTPATIENT_CLINIC_OR_DEPARTMENT_OTHER): Payer: Self-pay | Admitting: Family

## 2022-07-08 ENCOUNTER — Telehealth (HOSPITAL_BASED_OUTPATIENT_CLINIC_OR_DEPARTMENT_OTHER): Payer: Self-pay

## 2022-07-08 DIAGNOSIS — I6523 Occlusion and stenosis of bilateral carotid arteries: Secondary | ICD-10-CM

## 2022-07-08 NOTE — Telephone Encounter (Signed)
Called to speak with patient regarding the scheduling of the Carotid duplex that has been ordered----No answer and vm is full

## 2022-07-08 NOTE — Addendum Note (Signed)
Addended by: Alvina Filbert B on: 07/08/2022 04:20 PM   Modules accepted: Orders

## 2022-07-08 NOTE — Telephone Encounter (Signed)
Patient returned call and was updated!

## 2022-07-08 NOTE — Telephone Encounter (Addendum)
2 call attempts, phone answered and immediately hangs up, will also send a mychart message. Testing ordered and routed to scheduling team!     ----- Message from Loel Dubonnet, NP sent at 07/07/2022  8:12 PM EDT ----- Hi! On review of chart has carotid stenosis. Can we order repeat VAS US Carotid, please? Can try to schedule same day as echo or do separate days. TY!!!

## 2022-07-09 ENCOUNTER — Telehealth (HOSPITAL_BASED_OUTPATIENT_CLINIC_OR_DEPARTMENT_OTHER): Payer: Self-pay | Admitting: Family

## 2022-07-09 NOTE — Telephone Encounter (Signed)
Spoke with patient regarding new appointment date and time for the Echocardiogram Monday 07/28/22 at 9:00 am---Carotid duplex scheduled for 07/28/22 at 10:00 am.  Will mail information to patient and he voiced his understanding

## 2022-07-10 ENCOUNTER — Ambulatory Visit: Payer: Medicare HMO | Admitting: Family Medicine

## 2022-07-16 ENCOUNTER — Other Ambulatory Visit (HOSPITAL_BASED_OUTPATIENT_CLINIC_OR_DEPARTMENT_OTHER): Payer: Medicare HMO

## 2022-07-17 ENCOUNTER — Ambulatory Visit (INDEPENDENT_AMBULATORY_CARE_PROVIDER_SITE_OTHER): Payer: Medicare HMO | Admitting: Family Medicine

## 2022-07-17 ENCOUNTER — Encounter: Payer: Self-pay | Admitting: Family Medicine

## 2022-07-17 VITALS — BP 200/82 | HR 81 | Temp 98.0°F | Wt 157.2 lb

## 2022-07-17 DIAGNOSIS — Z794 Long term (current) use of insulin: Secondary | ICD-10-CM | POA: Diagnosis not present

## 2022-07-17 DIAGNOSIS — R011 Cardiac murmur, unspecified: Secondary | ICD-10-CM

## 2022-07-17 DIAGNOSIS — E1142 Type 2 diabetes mellitus with diabetic polyneuropathy: Secondary | ICD-10-CM | POA: Diagnosis not present

## 2022-07-17 DIAGNOSIS — I158 Other secondary hypertension: Secondary | ICD-10-CM

## 2022-07-17 DIAGNOSIS — N1832 Chronic kidney disease, stage 3b: Secondary | ICD-10-CM

## 2022-07-17 DIAGNOSIS — E113411 Type 2 diabetes mellitus with severe nonproliferative diabetic retinopathy with macular edema, right eye: Secondary | ICD-10-CM

## 2022-07-17 NOTE — Patient Instructions (Signed)
It is important that you bring your blood pressure monitor and glucometer to each appointment along with daily record of your readings so that we know how to properly adjust your medications.  It appears that you do not have another follow-up with cardiology until November.  This is too far out.  Contact cardiology about setting up an appointment sooner than this given continued elevation in your blood pressure.

## 2022-07-17 NOTE — Progress Notes (Signed)
Subjective:    Patient ID: [name], male    DOB: 04-11-1951, 71 y.o.   MRN: [id number]  Chief Complaint  Patient presents with   Follow-up    BP and DM    HPI Patient was seen today for follow-up.  Patient seen by cardiology 07/03/2022, hydralazine increased to 100 mg TID.  Also taking Lopressor 25 mg twice daily, triamterene-hydrochlorothiazide 37.5-25 mg daily, diltiazem 360 mg daily.  At home systolic BP 790, 240, 973/53-29J per memory.  Patient states blood sugar is "up-and-down".  Highest blood sugar reading 180.  Denies hypoglycemia.  Pt taking Farxiga 5 mg daily and Novolin 70/30 20 units in a.m. and 24 units nightly.  Pt has some meds with him this visit.    Past Medical History:  Diagnosis Date   CKD (chronic kidney disease)    Diabetes mellitus without complication ([place])    Hyperlipidemia    Hypertension    Insomnia    Prostate cancer ([place])     No Known Allergies  ROS General: Denies fever, chills, night sweats, changes in weight, changes in appetite HEENT: Denies headaches, ear pain, changes in vision, rhinorrhea, sore throat CV: Denies CP, palpitations, SOB, orthopnea Pulm: Denies SOB, cough, wheezing GI: Denies abdominal pain, nausea, vomiting, diarrhea, constipation GU: Denies dysuria, hematuria, frequency Msk: Denies muscle cramps, joint pains Neuro: Denies weakness, numbness, tingling Skin: Denies rashes, bruising Psych: Denies depression, anxiety, hallucinations     Objective:    Blood pressure (!) 200/96, pulse 81, temperature 98 F (36.7 C), temperature source Oral, weight 157 lb 3.2 oz (71.3 kg), SpO2 96 %.  Gen. Pleasant, well-nourished, in no distress, normal affect   HEENT: [place]/AT, face symmetric, conjunctiva clear, no scleral icterus, PERRLA, EOMI, nares patent without drainage Lungs: no accessory muscle use, CTAB, no wheezes or rales Cardiovascular: RRR,3/6 murmur heard throughout, no peripheral edema.  No renal artery  bruits. Musculoskeletal: No deformities, no cyanosis or clubbing, normal tone Neuro:  A&Ox3, CN II-XII intact, normal gait Skin:  Warm, no lesions/ rash.  Calluses bilateral hands.   Wt Readings from Last 3 Encounters:  07/17/22 157 lb 3.2 oz (71.3 kg)  07/03/22 157 lb (71.2 kg)  06/12/22 150 lb 9.6 oz (68.3 kg)    Lab Results  Component Value Date   WBC 4.9 05/16/2022   HGB 11.5 (L) 05/16/2022   HCT 33.0 (L) 05/16/2022   PLT 217.0 05/16/2022   GLUCOSE 116 (H) 05/29/2022   CHOL 179 04/10/2022   TRIG 68.0 04/10/2022   HDL 53.70 04/10/2022   LDLCALC 111 (H) 04/10/2022   ALT 22 04/10/2022   AST 27 04/10/2022   NA 141 05/29/2022   K 4.7 05/29/2022   CL 107 05/29/2022   CREATININE 1.89 (H) 05/29/2022   BUN 31 (H) 05/29/2022   CO2 26 05/29/2022   TSH 1.35 04/10/2022   PSA 6.49 (H) 03/21/2020   HGBA1C 6.5 (A) 06/12/2022   MICROALBUR 113.6 (H) 04/10/2022    Assessment/Plan:  Other secondary hypertension -uncontrolled -bp recheck by this provider 200/82 -discussed the importance of lifestyle modifications and consistent medication use. -as bp meds just increased by Cardiology will not increase this visit.   -pt advised to bring meter and readings to next OFV -continue f/u with Cardiology  Severe nonproliferative diabetic retinopathy of right eye with macular edema associated with type 2 diabetes mellitus ([place]) -Continue follow-up with ophthalmology  Type 2 diabetes mellitus with diabetic polyneuropathy, with long-term current use of insulin ([facility]) -hgb A1C 6.55 on  06/12/22 -continue lifestyle modifications -continue current meds, Novolin 70/30 20 units in am and 24 units in pm, farxiga 5 mg -continue statin, lipitor 40 mg daily -Continue follow-up with cardiology, ophthalmology  Stage 3b chronic kidney disease ([place]) -Stable -Continue follow-up with nephrology  Heart murmur -Continue follow-up with cardiology  F/u in 1 month, sooner if needed  Grier Mitts, MD

## 2022-07-21 ENCOUNTER — Telehealth: Payer: Self-pay

## 2022-07-21 DIAGNOSIS — Z0000 Encounter for general adult medical examination without abnormal findings: Secondary | ICD-10-CM

## 2022-07-21 NOTE — Telephone Encounter (Signed)
Patient returned call and stated that he was able to turn the device on but has not been able to get it to connect so it's not working. Provided patient with [employer] support number and placed a ticket so that someone can reach out to him to troubleshoot connecting his device via Bluetooth. Will follow up with patient by end of today to ensure that he has been in contact with someone from [place].   Aaliyana Fredericks [facility] Guide, Health Coach [address] [place] [address] Telephone: [phone] Email: [email or web address]

## 2022-07-21 NOTE — Telephone Encounter (Signed)
Called patient's cell number and was not able to leave a voicemail regarding [employer] to check his blood pressure for Laurann [place]. Called [name] to get in contact with patient. Silva Bandy was provide my direct contact information for patient to reach out to me to ensure that he is able to use his [employer] cuff to check his pressure twice daily.    Alyia Lacerte [facility]ward [facility] Guide, Health Coach [address] [place] [address] Telephone: [phone] Email: [email or web address]

## 2022-07-22 DIAGNOSIS — I10 Essential (primary) hypertension: Secondary | ICD-10-CM

## 2022-07-25 ENCOUNTER — Telehealth (HOSPITAL_BASED_OUTPATIENT_CLINIC_OR_DEPARTMENT_OTHER): Payer: Self-pay

## 2022-07-25 MED ORDER — CARVEDILOL 25 MG PO TABS: 25.0000 mg | ORAL_TABLET | Freq: Two times a day (BID) | ORAL | 3 refills | Status: DC

## 2022-07-25 NOTE — Telephone Encounter (Addendum)
Recommendations called to patient who verbalizes understanding and confirmed pharmacy.      ----- Message from Loel Dubonnet, NP sent at 07/25/2022 12:48 PM EDT ----- Regarding: RE: BP 204/90 X 2 readings Thanks, [name]!  Will ask [name] to call him and have him stop Metoprolol and start Carvedilol '25mg'$  BID. Hopefully that will improve BP readings.   Loel Dubonnet, NP  ----- Message ----- From: Altamease Oiler, RN Sent: 07/25/2022  10:50 AM EDT To: Rockne Menghini, RPH-CPP; # Subject: BP 204/90 X 2 readings                         Called patient and discussed the vital sign readings.  BP 204/90 X 2 readings. [name] did not report any symptoms to me over the phone. He is taking his medications as prescibed. I asked him to call the office if any symptoms arise and that I would contact the pharmacist, NP and update about the situation.

## 2022-07-28 ENCOUNTER — Other Ambulatory Visit (HOSPITAL_BASED_OUTPATIENT_CLINIC_OR_DEPARTMENT_OTHER): Payer: Medicare HMO

## 2022-07-28 ENCOUNTER — Encounter (HOSPITAL_BASED_OUTPATIENT_CLINIC_OR_DEPARTMENT_OTHER): Payer: Medicare HMO

## 2022-07-28 ENCOUNTER — Telehealth: Payer: Self-pay | Admitting: *Deleted

## 2022-07-28 NOTE — Telephone Encounter (Signed)
TC to pt after red alert BPs the last week, and no show at HTN clinic this AM. Pt was WNL and asymptomatic. Stated he just got confused with his multiple doctor appointments. Stated he took his BP prior to taking his BP medication this morning. Acknowledged that he would repeat BP 1hr after taking his BP med. Acknowledged he would seek medical attention if he becomes symptomatic.

## 2022-07-28 NOTE — Telephone Encounter (Signed)
Thanks for the update Elmyra Ricks. He was just instructed to transition Metoprolol to Carvedilol on 07/25/22. Interestingly on 07/26/22 had BP 122/63 and 111/62. So suspicious for possible missed doses.   Will ask Daphene Jaeger to call him to inquire:  Did he pick up new Carvedilol? What time is he taking Carvedilol, Diltiazem, Hydralazine? Is he sitting to rest 5-10 minutes prior to checking blood pressure?  (Kaila - of note Elmyra Ricks already started a phone encounter so you can just right click and 'addend' that one)  TY!  Loel Dubonnet, NP

## 2022-07-28 NOTE — Telephone Encounter (Signed)
Routing to your for input.Marland KitchenMarland KitchenWould recommend adjust Coreg to 9a and 9p. Hesitant based on renal function. He also still needs to have his labs done so will remind him to do so. Prior to labs thoughts on next steps?  Loel Dubonnet, NP

## 2022-07-28 NOTE — Telephone Encounter (Signed)
Called patient at the request of the NP. He endorses picking up his carvedilol the day we called it in for him.   Carvedilol- 9am/4pm Diltiazem- 9am  Hydralazine- 9am, 4-5pm, and 9-10am    Asked the patient if he sits and rests before checking his blood pressure and he states he will sit down and its a little high so he continues to recheck it and it only keeps going up but he doesn't know why that happens. RN explained that when we recheck it multiple times it will cause it to go up.   Advised patient that I would send this over for review and be back in touch with any recommendations needed.

## 2022-07-29 NOTE — Telephone Encounter (Signed)
I would move the second dose of carvedilol to 9 pm.  Probably has better BP in the evenings right now because the doses are too close together.   He's on a big dose of diltiazem, and I don't see anything except hypertension as a reason for it.  May want to switch that in the future to amlodipine - 5 mg x 1 week then 10 mg daily (to avoid LEE).   Or could try a low dose of spironolactone (12.5 qd) and recheck kidneys

## 2022-07-29 NOTE — Telephone Encounter (Signed)
Please ask patient to adjust Coreg to 9am and 9pm.  He was recommended for labs at [place] and needs to please have these collected. I will check in on [employer] in a few days to reassess blood pressure after the change.  Loel Dubonnet, NP

## 2022-07-29 NOTE — Telephone Encounter (Signed)
Returned call to patient and provided the following recommendations, patient is agreeable and verbalizes understanding.      "Please ask patient to adjust Coreg to 9am and 9pm.  He was recommended for labs at [place] and needs to please have these collected. I will check in on [employer] in a few days to reassess blood pressure after the change.   Loel Dubonnet, NP "

## 2022-07-30 ENCOUNTER — Telehealth: Payer: Self-pay

## 2022-07-30 DIAGNOSIS — Z0000 Encounter for general adult medical examination without abnormal findings: Secondary | ICD-10-CM

## 2022-07-30 NOTE — Telephone Encounter (Signed)
Welcome call conducted. Patient had no questions or concerns regarding the use of the machine. Patient was made aware that if he has questions about his medications to contact the office or his pharmacy. Patient stated that his information on how to take his blood pressure medication was written down so he will call to discuss that concern. Patient was made aware that someone will be monitoring his blood pressure readings, and I will monitor his survey questions. Patient was offered health coaching for establishing an eating plan and incorporating fruits. Patient stated that he is monitoring his sodium intake, and he does eat fruits and vegetables, but it is not with every meal. Patient feels that his diet is adequate. Patient declined health coaching.    Ezreal Turay [facility] Guide, Health Coach [address] [place] [address] Telephone: [phone] Email: [email or web address]

## 2022-07-31 ENCOUNTER — Ambulatory Visit (HOSPITAL_BASED_OUTPATIENT_CLINIC_OR_DEPARTMENT_OTHER): Payer: Medicare HMO | Admitting: Family

## 2022-08-04 ENCOUNTER — Telehealth: Payer: Self-pay | Admitting: *Deleted

## 2022-08-04 NOTE — Telephone Encounter (Signed)
BP 231/90 called patient to see if he had any symptoms with this. He stated no. Last med change 9/29. Next appointment 10/20

## 2022-08-04 NOTE — Telephone Encounter (Signed)
Called patient because BP up to 231/90, he states he has no symptoms. Told him to call office if he had symptoms.

## 2022-08-08 ENCOUNTER — Telehealth: Payer: Self-pay | Admitting: *Deleted

## 2022-08-08 MED ORDER — AMLODIPINE BESYLATE 10 MG PO TABS: 10.0000 mg | ORAL_TABLET | Freq: Every day | ORAL | 2 refills | Status: DC

## 2022-08-08 NOTE — Telephone Encounter (Signed)
Prescription sent via fax.   Loel Dubonnet, NP

## 2022-08-08 NOTE — Addendum Note (Signed)
Addended by: Gerald Stabs on: 08/08/2022 12:05 PM   Modules accepted: Orders

## 2022-08-08 NOTE — Addendum Note (Signed)
Addended by: Gerald Stabs on: 08/08/2022 12:08 PM   Modules accepted: Orders

## 2022-08-08 NOTE — Telephone Encounter (Signed)
BP remains labile and elevated. Recommend stop Diltiazem and start Amlodipine '10mg'$  daily.   Loel Dubonnet, NP

## 2022-08-08 NOTE — Telephone Encounter (Signed)
Left message for patient to call back. MED LISTED UPDATED AND RX TO PHARMACY ON FILE, REMIND PATIENT TO GET LABS DONE      "BP remains labile and elevated. Recommend stop Diltiazem and start Amlodipine '10mg'$  daily.    Loel Dubonnet, NP "

## 2022-08-08 NOTE — Telephone Encounter (Signed)
Call to check on pt.  Out of range biometric reading in the [employer] portal. Pt believes elevation is related to stressful phone call he received just prior to taking his morning reading. He reports he has no symptoms and is "okay".  Aileen Fass, RN

## 2022-08-08 NOTE — Addendum Note (Signed)
Addended by: Loel Dubonnet on: 08/08/2022 05:03 PM   Modules accepted: Orders

## 2022-08-08 NOTE — Telephone Encounter (Signed)
Unable to eprescribe medication, unable to get pharmacy on the phone will fax prescription to have filled for patient.

## 2022-08-08 NOTE — Addendum Note (Signed)
Addended by: Gerald Stabs on: 08/08/2022 12:18 PM   Modules accepted: Orders

## 2022-08-11 NOTE — Telephone Encounter (Signed)
Called patient and provided the following recommendations. Instructed patient to go pick up new medication.      "BP remains labile and elevated. Recommend stop Diltiazem and start Amlodipine '10mg'$  daily.    Loel Dubonnet, NP "

## 2022-08-13 ENCOUNTER — Telehealth (HOSPITAL_BASED_OUTPATIENT_CLINIC_OR_DEPARTMENT_OTHER): Payer: Self-pay

## 2022-08-13 NOTE — Telephone Encounter (Signed)
Received a message from [place] stating pt had questions regarding medication.  Attempted to contact pt. Left message to call back

## 2022-08-14 ENCOUNTER — Ambulatory Visit (INDEPENDENT_AMBULATORY_CARE_PROVIDER_SITE_OTHER): Payer: Medicare HMO

## 2022-08-14 ENCOUNTER — Telehealth (HOSPITAL_BASED_OUTPATIENT_CLINIC_OR_DEPARTMENT_OTHER): Payer: Self-pay

## 2022-08-14 ENCOUNTER — Telehealth: Payer: Self-pay

## 2022-08-14 DIAGNOSIS — Z006 Encounter for examination for normal comparison and control in clinical research program: Secondary | ICD-10-CM

## 2022-08-14 DIAGNOSIS — I10 Essential (primary) hypertension: Secondary | ICD-10-CM

## 2022-08-14 DIAGNOSIS — Z0000 Encounter for general adult medical examination without abnormal findings: Secondary | ICD-10-CM

## 2022-08-14 DIAGNOSIS — I6523 Occlusion and stenosis of bilateral carotid arteries: Secondary | ICD-10-CM

## 2022-08-14 DIAGNOSIS — Z794 Long term (current) use of insulin: Secondary | ICD-10-CM

## 2022-08-14 DIAGNOSIS — R011 Cardiac murmur, unspecified: Secondary | ICD-10-CM | POA: Diagnosis not present

## 2022-08-14 LAB — ECHOCARDIOGRAM COMPLETE
AR max vel: 1.24 cm2
AV Area VTI: 1.25 cm2
AV Area mean vel: 1.24 cm2
AV Mean grad: 9 mmHg
AV Peak grad: 16 mmHg
Ao pk vel: 2 m/s
Area-P 1/2: 4.49 cm2
MV M vel: 5.33 m/s
MV Peak grad: 113.6 mmHg
P 1/2 time: 373 msec
S' Lateral: 3.03 cm

## 2022-08-14 NOTE — Addendum Note (Signed)
Addended by: Gerald Stabs on: 08/14/2022 03:41 PM   Modules accepted: Orders

## 2022-08-14 NOTE — Telephone Encounter (Signed)
Received secure chat from Echo tech about patient having medication questions during his scan. Attempted to call patient, no answer, left message to call back.   Plan is to get patient scheduled for nurse visit- ask him to bring, all pill bottles, also need to take patient to the lab while he is here for nurse visit.

## 2022-08-14 NOTE — Telephone Encounter (Signed)
Updated labs ordered, will have collected after nurse visit tomorrow

## 2022-08-14 NOTE — Telephone Encounter (Signed)
Spoke with [name], patient reached out to her. He is agreeable to a nurse visit tomorrow at 1pm, will reach out to scheduling for assistance. Patient is to bring all pill bottles for review. We will also get his labs tomorrow while he is here.

## 2022-08-14 NOTE — Telephone Encounter (Signed)
Called patient to discuss if he had questions about his medications or if it was an error. Patient is having a hard time understanding how to take his medications. Reached out to [employer] and Elonda Husky regarding patient's concern. Called patient back informing him that they would like to see him in the office tomorrow at 1pm. Patient stated that he would be able to come to see Caitlin at [place] at that time. Patient has been scheduled to be seen tomorrow by Urban Gibson at 1pm and was advised to bring all his medications. Patient expressed verbal agreement.   Kerman Pfost Truman Hayward [facility] Guide, Health Coach [address] [place] [address] Telephone: [phone] Email: [email or web address]

## 2022-08-14 NOTE — Telephone Encounter (Signed)
Can go ahead and do BMP, renin-aldosterone, catecholamines, metanephrines but NOT cortisol as it would not be accurate.   Loel Dubonnet, NP

## 2022-08-15 ENCOUNTER — Ambulatory Visit: Payer: Medicare HMO | Admitting: Family Medicine

## 2022-08-15 ENCOUNTER — Ambulatory Visit (HOSPITAL_BASED_OUTPATIENT_CLINIC_OR_DEPARTMENT_OTHER): Payer: Medicare HMO

## 2022-08-18 ENCOUNTER — Telehealth: Payer: Self-pay

## 2022-08-18 ENCOUNTER — Other Ambulatory Visit: Payer: Self-pay | Admitting: Family Medicine

## 2022-08-18 ENCOUNTER — Ambulatory Visit (INDEPENDENT_AMBULATORY_CARE_PROVIDER_SITE_OTHER): Payer: Medicare HMO | Admitting: Family Medicine

## 2022-08-18 ENCOUNTER — Encounter: Payer: Self-pay | Admitting: Family Medicine

## 2022-08-18 VITALS — BP 202/98 | HR 79 | Temp 98.1°F | Wt 159.0 lb

## 2022-08-18 DIAGNOSIS — I10 Essential (primary) hypertension: Secondary | ICD-10-CM

## 2022-08-18 DIAGNOSIS — G4709 Other insomnia: Secondary | ICD-10-CM

## 2022-08-18 DIAGNOSIS — E1142 Type 2 diabetes mellitus with diabetic polyneuropathy: Secondary | ICD-10-CM

## 2022-08-18 DIAGNOSIS — Z79899 Other long term (current) drug therapy: Secondary | ICD-10-CM | POA: Diagnosis not present

## 2022-08-18 MED ORDER — RAMELTEON 8 MG PO TABS: ORAL_TABLET | ORAL | 1 refills | Status: DC

## 2022-08-18 NOTE — Progress Notes (Signed)
Subjective:    Patient ID: [name], male    DOB: [birth year], 71 y.o.   MRN: [id number]  Chief Complaint  Patient presents with   Follow-up    BP, still elevated    HPI Patient is a 71 year old male with PMH SIG for CKD stage IIIb, HTN, HLD, DM 2, insomnia, history of prostate cancer s/p XRT, b/l carotid artery stenosis, diabetic macular edema, hypertensive retinopathy, severe nonproliferative diabetic retinopathy R eye, was seen today for f/u on HTN.  Pt states he is confused by all the med changes when ppl call him and tell him to pick up a new rx for something.  Pt states he took Coreg 25 mg this am and Norvasc 10 mg.  Pt has yet to take Hydralazine 100 mg this am.  Pt also frustrated that he has only seen the nephrologist and cardiologist once.  Has meds with him this visit.   Pt states insomnia is what is making bp elevated.  Pt states bp was "good" when he took tylenol pm.  Per memory systolic bp has been 962, 167.  Pt states he couldn't sleep last night.  Got out of bed around 1 am and stayed up until 4 am, then took a 1 hr nap. Pt states he has never been able to sleep.  States saw sleep med provider however they told him there was no point of a sleep study.  Pt having difficulty keeping up with appts.  Thinks he has an eye exam scheduled and an appt on 10/31 maybe with Cards.  Patient states blood sugar at home has been "pretty good".  Patient pt member staying from 180 reading.  Taking Engineer, agricultural.  Unsure if taking Farxiga 5 mg daily.  Past Medical History:  Diagnosis Date   CKD (chronic kidney disease)    Diabetes mellitus without complication ([facility])    Hyperlipidemia    Hypertension    Insomnia    Prostate cancer ([place])     No Known Allergies  ROS General: Denies fever, chills, night sweats, changes in weight, changes in appetite HEENT: Denies headaches, ear pain, changes in vision, rhinorrhea, sore throat CV: Denies CP, palpitations, SOB, orthopnea Pulm: Denies SOB,  cough, wheezing GI: Denies abdominal pain, nausea, vomiting, diarrhea, constipation GU: Denies dysuria, hematuria, frequency Msk: Denies muscle cramps, joint pains Neuro: Denies weakness, numbness, tingling Skin: Denies rashes, bruising Psych: Denies depression, anxiety, hallucinations  + insomnia     Objective:    Blood pressure (!) 202/98, pulse 79, temperature 98.1 F (36.7 C), temperature source Oral, weight 159 lb (72.1 kg), SpO2 98 %.  Gen. Pleasant, well-nourished, in no distress, normal affect   HEENT: [place]/AT, face symmetric, conjunctiva clear, no scleral icterus, PERRLA, EOMI, nares patent without drainage, pharynx without erythema or exudate. Neck: No JVD, no thyromegaly, no carotid bruits Lungs: no accessory muscle use, CTAB, no wheezes or rales Cardiovascular: RRR, no m/r/g, no peripheral edema Abdomen: BS present, soft, NT/ND, no hepatosplenomegaly. Musculoskeletal: No deformities, no cyanosis or clubbing, normal tone Neuro:  A&Ox3, CN II-XII intact, normal gait Skin:  Warm, no lesions/ rash   Wt Readings from Last 3 Encounters:  08/18/22 159 lb (72.1 kg)  07/17/22 157 lb 3.2 oz (71.3 kg)  07/03/22 157 lb (71.2 kg)    Lab Results  Component Value Date   WBC 4.9 05/16/2022   HGB 11.5 (L) 05/16/2022   HCT 33.0 (L) 05/16/2022   PLT 217.0 05/16/2022   GLUCOSE 116 (H) 05/29/2022   CHOL  179 04/10/2022   TRIG 68.0 04/10/2022   HDL 53.70 04/10/2022   LDLCALC 111 (H) 04/10/2022   ALT 22 04/10/2022   AST 27 04/10/2022   NA 141 05/29/2022   K 4.7 05/29/2022   CL 107 05/29/2022   CREATININE 1.89 (H) 05/29/2022   BUN 31 (H) 05/29/2022   CO2 26 05/29/2022   TSH 1.35 04/10/2022   PSA 6.49 (H) 03/21/2020   HGBA1C 6.5 (A) 06/12/2022   MICROALBUR 113.6 (H) 04/10/2022    Assessment/Plan:  Malignant hypertension -Uncontrolled -Recheck BP -Patient encouraged to take a.m. dose of hydralazine 100 mg. -Discussed the importance of med compliance, lifestyle  modifications -Advised understand patient's frustration with medications and continued elevation in BP. -Continue hydralazine 100 mg 3 times daily, Coreg 25 mg twice daily, Norvasc 10 mg daily. -Continue monitoring BP at home -Encouraged to keep follow-up appointments with nephrology, cardiology  Other insomnia -Chronic -Sleep hygiene encouraged -In the past hydroxyzine and trazodone were not helpful.  - Plan: ramelteon (ROZEREM) 8 MG tablet  Type 2 diabetes mellitus with diabetic polyneuropathy, without long-term current use of insulin (HCC) -hgb A1c 6.5% on 06/13/2019 -Continue Basaglar 20 mg in a.m. and 24 mg in evening -Patient to check prescriptions at home to see if he is still taking Farxiga 5 mg. -Continue Lipitor 40 mg daily -Not currently on ACEI or ARB -Continue follow-up with podiatry -Discussed diabetic retinopathy screening.  Advised screening available in clinic on Thursday/11/2.  Medication management -Went through patient's medications with him and marked ones that he should not be taking. -Patient to go home and check remaining prescription bottles to see if he has [place] and if he has been taking it. -Encouraged to bring all medications to each appointment  F/u in 4-6 weeks, sooner if needed Grier Mitts, MD

## 2022-08-18 NOTE — Patient Instructions (Addendum)
Continue your current blood pressure medications including Coreg 25 mg twice a day, hydralazine 100 mg 3 times a day, and Norvasc 10 mg once a day.  A prescription for melatonin 8 mg was sent to your pharmacy.  This is a prescription to help with sleep.  You are to take this 30 minutes prior to going to bed.  You will need to follow-up in 1 month to see how this medication is working.  Is important that you keep your upcoming appointments and monitor your blood sugar and blood pressure regularly.  When you get home check to see if you are still taking [place].  This is another medication for your diabetes in addition to your injection.

## 2022-08-19 ENCOUNTER — Telehealth (HOSPITAL_BASED_OUTPATIENT_CLINIC_OR_DEPARTMENT_OTHER): Payer: Self-pay

## 2022-08-19 NOTE — Telephone Encounter (Signed)
-----   Message from Loel Dubonnet, NP sent at 08/19/2022  9:56 AM EDT ----- Regarding: RE: Very high BP He unfortunately no showed his nurse visit 08/15/22. Based on conversation Amy (health coach) had with him there was confusion regarding his medications. He sees me 09/04/22. If he does answer or calls back we could try to reschedule a nurse visit. I"ll CC Akela Pocius as an FYI.   Thanks! ----- Message ----- From: Kizzie Ide, RN Sent: 08/18/2022  10:12 AM EDT To: Loel Dubonnet, NP Subject: Very high BP                                   BP this morning was 200/92. I reached out to the patient via phone and no answer. I was unable to leave a message due to mailbox full. He had an appointment with you on 10/20 but I did not see a note in the chart or any med changes. I'm not sure if this BP was before medication.

## 2022-08-21 DIAGNOSIS — I10 Essential (primary) hypertension: Secondary | ICD-10-CM

## 2022-08-26 DIAGNOSIS — D631 Anemia in chronic kidney disease: Secondary | ICD-10-CM | POA: Diagnosis not present

## 2022-08-26 DIAGNOSIS — E1122 Type 2 diabetes mellitus with diabetic chronic kidney disease: Secondary | ICD-10-CM | POA: Diagnosis not present

## 2022-08-26 DIAGNOSIS — R7989 Other specified abnormal findings of blood chemistry: Secondary | ICD-10-CM | POA: Diagnosis not present

## 2022-08-26 DIAGNOSIS — C61 Malignant neoplasm of prostate: Secondary | ICD-10-CM | POA: Diagnosis not present

## 2022-08-26 DIAGNOSIS — I129 Hypertensive chronic kidney disease with stage 1 through stage 4 chronic kidney disease, or unspecified chronic kidney disease: Secondary | ICD-10-CM | POA: Diagnosis not present

## 2022-08-26 DIAGNOSIS — N1832 Chronic kidney disease, stage 3b: Secondary | ICD-10-CM | POA: Diagnosis not present

## 2022-08-26 DIAGNOSIS — N189 Chronic kidney disease, unspecified: Secondary | ICD-10-CM | POA: Diagnosis not present

## 2022-08-26 DIAGNOSIS — E875 Hyperkalemia: Secondary | ICD-10-CM | POA: Diagnosis not present

## 2022-09-01 ENCOUNTER — Telehealth (HOSPITAL_BASED_OUTPATIENT_CLINIC_OR_DEPARTMENT_OTHER): Payer: Self-pay

## 2022-09-01 NOTE — Telephone Encounter (Signed)
Called patient and confirmed appointment and to remind patient to bring all his pill bottles to his appointment

## 2022-09-04 ENCOUNTER — Telehealth: Payer: Self-pay | Admitting: Licensed Clinical Social Worker

## 2022-09-04 ENCOUNTER — Ambulatory Visit (HOSPITAL_BASED_OUTPATIENT_CLINIC_OR_DEPARTMENT_OTHER): Payer: Medicare HMO | Admitting: Family

## 2022-09-04 ENCOUNTER — Encounter (HOSPITAL_BASED_OUTPATIENT_CLINIC_OR_DEPARTMENT_OTHER): Payer: Self-pay | Admitting: Family

## 2022-09-04 VITALS — BP 210/85 | HR 77 | Ht 66.5 in | Wt 160.3 lb

## 2022-09-04 DIAGNOSIS — I6523 Occlusion and stenosis of bilateral carotid arteries: Secondary | ICD-10-CM

## 2022-09-04 DIAGNOSIS — E782 Mixed hyperlipidemia: Secondary | ICD-10-CM | POA: Diagnosis not present

## 2022-09-04 DIAGNOSIS — I10 Essential (primary) hypertension: Secondary | ICD-10-CM | POA: Diagnosis not present

## 2022-09-04 DIAGNOSIS — R011 Cardiac murmur, unspecified: Secondary | ICD-10-CM

## 2022-09-04 DIAGNOSIS — Z006 Encounter for examination for normal comparison and control in clinical research program: Secondary | ICD-10-CM

## 2022-09-04 MED ORDER — DOXAZOSIN MESYLATE 2 MG PO TABS: 2.0000 mg | ORAL_TABLET | Freq: Every day | ORAL | 1 refills | Status: DC

## 2022-09-04 MED ORDER — AMLODIPINE BESYLATE 10 MG PO TABS: 10.0000 mg | ORAL_TABLET | Freq: Every day | ORAL | 3 refills | Status: DC

## 2022-09-04 NOTE — Progress Notes (Signed)
Advanced Hypertension Clinic Initial Assessment:    Date:  09/04/2022   ID:  [name], DOB 02/02/1951, MRN [id number]  PCP:  [name], MD  Cardiologist:  None  Nephrologist:  Referring MD: [name], MD   CC: Hypertension  History of Present Illness:    [name] is a 71 y.o. male with a hx of DM2, HLD, HTN, CKD here to follow up in the Advanced Hypertension Clinic.   Sonam Waibel was diagnosed with hypertension 4-5 years ago. It has been difficult to control. Prior renal duplex 04/2021 with no stenosis. Blood pressure checked with arm cuff at home. Readings have been 180s. Home BP cuff has been checked for accuracy.  No alcohol nor tobacco use.. he eats at home and outside of the home. 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 He used to work as a Training and development officer for work and though reports following low salt diet often eats out, eats premade meals, or seasons food with salt. Follows with nephrology and is concerned regarding his renal function and blood pressure. Previous notes from primary care state he often does not give medications much of a chance prior to stopping or requesting change.   Established with advanced hypertension clinic 07/03/22. He was enrolled in remote patient monitoring. Metoprolol increased and later transitioned to Coreg '25mg'$  BID. Hydralazine increased to '100mg'$  TID. Diltiazem later transitioned to Amlodipine. Carotid duplex 08/14/22 bilateral 1-39% stenosis. Echo 08/14/22 normal LVEF 60-65%, mild AI with aortic valve sclerosis but no stenosis, gr1DD, RV normal, LA mildly dilated, mild MR. Labs for secondary hypertension ordered (renin aldosterone, catecholamines, metanephrines, cortisol) but not completed.  He presents today for follow up. Takes his morning medications betweens 9a-10a, afternoon 5p-6p, and and last dose at bedtime around 1am. Has been taking Amlodipine '5mg'$  instead of  '10mg'$ . Does note he does not sleep well. Trouble falling asleep but declines sleep study.  Reports no shortness of breath nor dyspnea on exertion. Reports no chest pain, pressure, or tightness. No edema, orthopnea, PND. Reports no palpitations.  He previously had dizziness on Metformin which has not recurred since discontinuation of Metformin.PCP has instead recommended [place]. BP at home with RPM has been very labile with systolic ranging from 413-244W. Highest readings seem to occur in the morning prior to medications. No lightheadedness, dizziness with low readings.   Previous antihypertensives: Triamterene-HCTZ - discontinued due to renal function Amlodipine - unclear whether he ever picked up from pharmacy   Past Medical History:  Diagnosis Date   CKD (chronic kidney disease)    Diabetes mellitus without complication (HCC)    Hyperlipidemia    Hypertension    Insomnia    Prostate cancer ([place])     Past Surgical History:  Procedure Laterality Date   PROSTATE BIOPSY      Current Medications: Current Meds  Medication Sig   atorvastatin (LIPITOR) 40 MG tablet TAKE 1 TABLET BY MOUTH EVERY DAY   carvedilol (COREG) 25 MG tablet Take 1 tablet (25 mg total) by mouth 2 (two) times daily.   Cholecalciferol (VITAMIN D3) 50 MCG (2000 UT) TABS Take 1 tablet by mouth daily.   doxazosin (CARDURA) 2 MG tablet Take 1 tablet (2 mg total) by mouth at bedtime.   ferrous gluconate (FERGON) 324 MG tablet TAKE 1 TABLET BY MOUTH DAILY WITH BREAKFAST   hydrALAZINE (APRESOLINE) 100 MG tablet Take 1 tablet (100 mg total) by mouth 3 (three) times daily.   [  DISCONTINUED] amLODipine (NORVASC) 5 MG tablet Take 5 mg by mouth daily.     Allergies:   Patient has no known allergies.   Social History   Socioeconomic History   Marital status: Single    Spouse name: Not on file   Number of children: 2   Years of education: Not on file   Highest education level: Not on file  Occupational History     Comment: retired  Tobacco Use   Smoking status: Never   Smokeless tobacco: Never  Vaping Use   Vaping Use: Never used  Substance and Sexual Activity   Alcohol use: No   Drug use: No   Sexual activity: Yes  Other Topics Concern   Not on file  Social History Narrative   Not on file   Social Determinants of Health   Financial Resource Strain: Low Risk  (05/05/2022)   Overall Financial Resource Strain (CARDIA)    Difficulty of Paying Living Expenses: Not hard at all  Food Insecurity: No Food Insecurity (07/03/2022)   Hunger Vital Sign    Worried About Running Out of Food in the Last Year: Never true    [place] in the Last Year: Never true  Transportation Needs: No Transportation Needs (05/05/2022)   PRAPARE - Hydrologist (Medical): No    Lack of Transportation (Non-Medical): No  Physical Activity: Inactive (05/05/2022)   Exercise Vital Sign    Days of Exercise per Week: 0 days    Minutes of Exercise per Session: 0 min  Stress: No Stress Concern Present (05/05/2022)   [place]    Feeling of Stress : Not at all  Social Connections: Socially Isolated (04/17/2021)   Social Connection and Isolation Panel [NHANES]    Frequency of Communication with Friends and Family: Twice a week    Frequency of Social Gatherings with Friends and Family: Twice a week    Attends Religious Services: Never    Marine scientist or Organizations: No    Attends Music therapist: Never    Marital Status: Divorced    Family History: The patient's family history includes Colon cancer in his mother; Diabetes in his brother; Hypertension in his daughter and mother. There is no history of Breast cancer, Pancreatic cancer, or Prostate cancer.  ROS:   Please see the history of present illness.     All other systems reviewed and are negative.  EKGs/Labs/Other Studies Reviewed:    EKG:  EKG  is not  ordered today.    Echo 08/14/22 The aortic valve is tricuspid. There is moderate calcification of the  aortic valve. There is mild thickening of the aortic valve. Aortic valve  regurgitation is mild. Aortic valve sclerosis/calcification is present,  without any evidence of aortic  stenosis. Aortic valve mean gradient measures 9.0 mmHg. Aortic valve Vmax  measures 2.00 m/s.   2. Left ventricular ejection fraction, by estimation, is 60 to 65%. The  left ventricle has normal function. The left ventricle has no regional  wall motion abnormalities. Left ventricular diastolic parameters are  consistent with Grade I diastolic  dysfunction (impaired relaxation). The average left ventricular global  longitudinal strain is -17.1 %. The global longitudinal strain is normal.   3. Right ventricular systolic function is normal. The right ventricular  size is normal.   4. Left atrial size was mildly dilated.   5. The mitral valve is normal in  structure. Mild mitral valve  regurgitation. No evidence of mitral stenosis. Moderate mitral annular  calcification.   6. The inferior vena cava is normal in size with greater than 50%  respiratory variability, suggesting right atrial pressure of 3 mmHg.   Carotid duplex 08/14/22 Right Carotid: Velocities in the right ICA are consistent with a 1-39%  stenosis.   Left Carotid: Velocities in the left ICA are consistent with a 1-39%  stenosis.   Vertebrals: Bilateral vertebral arteries demonstrate antegrade flow.  Subclavians: Normal flow hemodynamics were seen in bilateral subclavian               arteries. Recent Labs: 04/10/2022: ALT 22; TSH 1.35 05/16/2022: Hemoglobin 11.5; Platelets 217.0 05/29/2022: BUN 31; Creatinine, Ser 1.89; Potassium 4.7; Sodium 141   Recent Lipid Panel    Component Value Date/Time   CHOL 179 04/10/2022 1014   TRIG 68.0 04/10/2022 1014   HDL 53.70 04/10/2022 1014   CHOLHDL 3 04/10/2022 1014   VLDL 13.6 04/10/2022 1014    LDLCALC 111 (H) 04/10/2022 1014   LDLCALC 70 09/07/2020 1038    Physical Exam:   VS:  BP (!) 210/85   Pulse 77   Ht 5' 6.5" (1.689 m)   Wt 160 lb 4.8 oz (72.7 kg)   BMI 25.49 kg/m  , BMI Body mass index is 25.49 kg/m. GENERAL:  Well appearing HEENT: Pupils equal round and reactive, fundi not visualized, oral mucosa unremarkable NECK:  No jugular venous distention, waveform within normal limits, carotid upstroke brisk and symmetric, no bruits, no thyromegaly LYMPHATICS:  No cervical adenopathy LUNGS:  Clear to auscultation bilaterally HEART:  RRR.  PMI not displaced or sustained,S1 and S2 within normal limits, no S3, no S4, no clicks, no rubs, Gr 3/6 murmurs ABD:  Flat, positive bowel sounds normal in frequency in pitch, no bruits, no rebound, no guarding, no midline pulsatile mass, no hepatomegaly, no splenomegaly EXT:  2 plus pulses throughout, no edema, no cyanosis no clubbing SKIN:  No rashes no nodules NEURO:  Cranial nerves II through XII grossly intact, motor grossly intact throughout PSYCH:  Cognitively intact, oriented to person place and time   ASSESSMENT/PLAN:    HTN - BP not at goal <130/80. Diagnosed with hypertension 4-5 years ago. Management complicated by renal function and per PCP documentation often not giving medications much of a chance. 04/2021 renal artery duplex no stenosis. 03/2022 normal thyroid function. Previously referred to PREP and health coaching but they were unable to reach them.  Labs including renin-aldosterone, catecholamines/metanephrines today.  04/10/22 TSH normal.  Medication management: Continue Coreg '25mg'$  BID, Hydralazine '100mg'$  TID. Increase Amlodipine to '10mg'$  daily. Add Doxazosin '2mg'$  QHS.   Snores / sleep disordered breathing - Notes some snoring though wakes feeling rested. Longstanding history of insomnia. Politely declines sleep study today.   CKD - Careful titration of diuretic and antihypertensive.  Established with nephrology. [id number]  creatinine 1.74, GFR 42.  Anemia - 08/26/22 Hb 10.6. Recommended to start OTC iron by nephrology.   DM2 - Continue to follow with PCP. Appreciate inclusion of SGLT2i for cardioprotective benefit.   Cardiac murmur - noted on exam. Echo 08/14/22 LVEF 60-65%, moderate calcification of aortic valve with mild AI and no stenosis, gr1DD, LA mildly dilated, mild MR, moderate mitral annular calcification. Consider repeat echo in 1 year for monitoring. Murmur likely due to aortic calcification.   HLD - Continue Atorvastatin per PCP. Update LFT, direct LDL today.   Carotid artery stenosis - Carotid duplex  07/2020 with moderate left sided elevated plaque. Minimal amount of right sided plaque. 07/2022 carotid duplex bilateral 1-39% stenosis. Consider repeat duplex one year.   Screening for Secondary Hypertension:     07/07/2022    8:10 PM  Causes  Drugs/Herbals Screened  Renovascular HTN Screened  Thyroid Disease Screened  Hyperaldosteronism Screened  Pheochromocytoma Screened  Cushing's Syndrome Screened  Coarctation of the Aorta Screened    Relevant Labs/Studies:    Latest Ref Rng & Units 05/29/2022    9:26 AM 05/16/2022    7:41 AM 05/09/2022    9:57 AM  Basic Labs  Sodium 135 - 145 mEq/L 141  142  141   Potassium 3.5 - 5.1 mEq/L 4.7  4.5  5.1   Creatinine 0.40 - 1.50 mg/dL 1.89  2.16  2.19        Latest Ref Rng & Units 04/10/2022   10:14 AM 09/07/2020   10:38 AM  Thyroid   TSH 0.35 - 5.50 uIU/mL 1.35  1.01                   Disposition:    FU with Dr. Oval Linsey in 2 months  Medication Adjustments/Labs and Tests Ordered: Current medicines are reviewed at length with the patient today.  Concerns regarding medicines are outlined above.  Orders Placed This Encounter  Procedures   Metanephrines, plasma   Catecholamines, fractionated, plasma   Aldosterone + renin activity w/ ratio   Hepatic function panel   Direct LDL   Meds ordered this encounter  Medications   doxazosin  (CARDURA) 2 MG tablet    Sig: Take 1 tablet (2 mg total) by mouth at bedtime.    Dispense:  90 tablet    Refill:  1    Order Specific Question:   Supervising Provider    Answer:   Buford Dresser [[id number]]   amLODipine (NORVASC) 10 MG tablet    Sig: Take 1 tablet (10 mg total) by mouth daily.    Dispense:  90 tablet    Refill:  3     Signed, Loel Dubonnet, NP  07/03/2022 1:14 PM    [facility]

## 2022-09-04 NOTE — Research (Signed)
I saw pt today after Laurann Montana- NP follow up visit. Pt is in Dr. Blenda Mounts Virtual Care HTN Study. Pt filled out research survey. Pt was enrolled in Group 2. Please see pt's response to Social Determinants of Health questions,   How hard is it for you to pay for the very basics like  food, housing, medical care and utilities? Pt answered somewhat hard   Do you have problems with pest, mold, lead and or water leaks at the place where you stay?  Pt answered yes. He stated he has a water leak. His water bill is $1400 and his water was cut off twice. A pt referral was placed for social work.

## 2022-09-04 NOTE — Telephone Encounter (Signed)
H&V Care Navigation CSW Progress Note  Clinical Social Worker contacted patient by phone to f/u on challenges w/ water bill. No answer at [phone], left voicemail requesting call back. Will re-attempt as able.   Patient is participating in a Managed Medicaid Plan:  No, Aetna Medicare  [place]: No Food Insecurity (07/03/2022)  Housing: Low Risk  (07/03/2022)  Transportation Needs: No Transportation Needs (05/05/2022)  Alcohol Screen: Low Risk  (07/03/2022)  Depression (PHQ2-9): Low Risk  (07/17/2022)  Financial Resource Strain: Low Risk  (05/05/2022)  Physical Activity: Inactive (05/05/2022)  Social Connections: Socially Isolated (04/17/2021)  Stress: No Stress Concern Present (05/05/2022)  Tobacco Use: Low Risk  (09/04/2022)   Westley Hummer, MSW, LCSW Clinical Social Worker [place]  [phone]- work cell phone (preferred) [phone]- desk phone

## 2022-09-04 NOTE — Progress Notes (Signed)
Heart and Vascular Care Navigation  09/04/2022  [name] 07/21/1951 [id number]  Reason for Referral:  Patient is participating in a Managed Medicaid Plan: No, Aetna Medicare only  Engaged with patient by telephone for initial visit for Heart and Vascular Care Coordination.                                                                                                   Assessment:             LCSW received a call back from pt. I introduced self, role, reason for call. Pt confirmed home address, PCP and current insurance. He lives alone, his significant other and brother remain his emergency contacts at this time. Pt drives himself to all appts, receives SNAP, denies issues with costs of living except a water bill. He shares that he has had several plumbers out to his home and has attempted repairs on his own and unfortunately he has been unable to solve an issue with his water. The costs skyrocketed and he has about $1400 in unpaid water costs. He had a plumber out this morning to check further. LCSW shared that pt should be able to submit repairs back to the city for reimbursement once repairs completed. Should pt not be able to receive full reimbursement I discussed he may be eligible for assistance through Patient Care Fund. No additional questions at this time.                          HRT/VAS Care Coordination     Patients Home Cardiology Office --  [facility]   Outpatient Care Team Social Worker   Social Worker Name: Valeda Malm, [facility] [phone]   Living arrangements for the past 2 months Single Family Home   Lives with: Self   Patient Current Insurance Coverage Managed Medicare   Patient Has Concern With Paying Medical Bills No   Does Patient Have Prescription Coverage? Yes   Home Assistive Devices/Equipment Blood pressure cuff; CBG Meter       Social History:                                                                              SDOH Screenings   Food Insecurity: No Food Insecurity (09/04/2022)  Housing: Low Risk  (07/03/2022)  Transportation Needs: No Transportation Needs (09/04/2022)  Utilities: At Risk (09/04/2022)  Alcohol Screen: Low Risk  (07/03/2022)  Depression (PHQ2-9): Low Risk  (07/17/2022)  Financial Resource Strain: Low Risk  (09/04/2022)  Physical Activity: Inactive (05/05/2022)  Social Connections: Socially Isolated (04/17/2021)  Stress: No Stress Concern Present (05/05/2022)  Tobacco Use: Low Risk  (09/04/2022)    SDOH Interventions: Financial Resources:  Financial Strain Interventions: Other (Comment) (no issues but with water bill;  pt offered assistance once repairs complete)  Food Insecurity:  Food Insecurity Interventions: Intervention Not Indicated (pt recieves SNAP)  Housing Insecurity:     Transportation:   Transportation Interventions: Intervention Not Indicated    Other Care Navigation Interventions:     Provided Pharmacy assistance resources  Denied any issues with affordability or accesibility   Follow-up plan:   LCSW has mailed pt my card and a reminder to let me know once repairs complete if we can assist with outstanding costs. I will f/u moving forward if I do not hear from pt.

## 2022-09-04 NOTE — Patient Instructions (Signed)
Medication Instructions:  Your physician has recommended you make the following change in your medication:   CHANGE: Amlodipine '10mg'$  daily   START: Doxazosin '2mg'$  tablet daily at bedtime    Labwork: Your physician recommends that you return for lab work today- direct LDL, Liver Function Tests, metanephrines, catecholamines, and renin-aldosterone    Follow-Up: Follow up as scheduled

## 2022-09-04 NOTE — Addendum Note (Signed)
Addended by: Loel Dubonnet on: 09/04/2022 02:25 PM   Modules accepted: Orders

## 2022-09-15 ENCOUNTER — Telehealth (HOSPITAL_BASED_OUTPATIENT_CLINIC_OR_DEPARTMENT_OTHER): Payer: Self-pay

## 2022-09-15 DIAGNOSIS — I10 Essential (primary) hypertension: Secondary | ICD-10-CM

## 2022-09-15 LAB — LDL CHOLESTEROL, DIRECT: LDL Direct: 98 mg/dL (ref 0–99)

## 2022-09-15 LAB — METANEPHRINES, PLASMA
Metanephrine, Free: <25 pg/mL (ref 0.0–88.0)
Normetanephrine, Free: 31.9 pg/mL (ref 0.0–285.2)

## 2022-09-15 LAB — CATECHOLAMINES, FRACTIONATED, PLASMA
Dopamine: <30 pg/mL (ref 0–48)
Epinephrine: 25 pg/mL (ref 0–62)
Norepinephrine: 282 pg/mL (ref 0–874)

## 2022-09-15 LAB — HEPATIC FUNCTION PANEL
ALT: 16 IU/L (ref 0–44)
AST: 23 IU/L (ref 0–40)
Albumin: 3.6 g/dL — ABNORMAL LOW (ref 3.9–4.9)
Alkaline Phosphatase: 80 IU/L (ref 44–121)
Bilirubin Total: 0.3 mg/dL (ref 0.0–1.2)
Bilirubin, Direct: 0.12 mg/dL (ref 0.00–0.40)
Total Protein: 6 g/dL (ref 6.0–8.5)

## 2022-09-15 LAB — ALDOSTERONE + RENIN ACTIVITY W/ RATIO
Aldos/Renin Ratio: <2.8 (ref 0.0–30.0)
Aldosterone: <1 ng/dL (ref 0.0–30.0)
Renin Activity, Plasma: 0.356 ng/mL/hr (ref 0.167–5.380)

## 2022-09-15 MED ORDER — DOXAZOSIN MESYLATE 4 MG PO TABS: 4.0000 mg | ORAL_TABLET | Freq: Every day | ORAL | 1 refills | Status: DC

## 2022-09-15 MED ORDER — DOXAZOSIN MESYLATE 2 MG PO TABS: 4.0000 mg | ORAL_TABLET | Freq: Every day | ORAL | 3 refills | Status: DC

## 2022-09-15 NOTE — Addendum Note (Signed)
Addended by: Loel Dubonnet on: 09/15/2022 10:20 AM   Modules accepted: Orders

## 2022-09-15 NOTE — Telephone Encounter (Addendum)
Results called to patient who verbalizes understanding!     ----- Message from Loel Dubonnet, NP sent at 09/15/2022  8:10 AM EST ----- Normal liver enzymes. Normal metanephrines/catecholamines. Normal renin aldosterone ratio. Direct LDL of 98 which is at goal of less than 100. Good result!  BP reviewed in [place] - recommend increase Doxazosin to '4mg'$  QHS.

## 2022-09-16 ENCOUNTER — Telehealth: Payer: Self-pay | Admitting: Licensed Clinical Social Worker

## 2022-09-16 NOTE — Telephone Encounter (Signed)
H&V Care Navigation CSW Progress Note  Clinical Social Worker contacted patient by phone to f/u on assistance for water/repairs. Pt answered at [phone]. Re-introduced self, role, reason for call. Pt shares that the plumber had mentioned that the hot water line was "busted" and thinks the repair is complete. He is waiting on the next water bill to see what it will be and if it is fixed he was encouraged to f/u with the city first and then our assistance. Pt and this Probation officer agree to speak at the beginning of December to see what we can assist with if still needed. No additional questions from pt at this time.   Patient is participating in a Managed Medicaid Plan:  No, Aetna Medicare only.   SDOH Screenings   Food Insecurity: No Food Insecurity (09/04/2022)  Housing: Low Risk  (07/03/2022)  Transportation Needs: No Transportation Needs (09/04/2022)  Utilities: At Risk (09/04/2022)  Alcohol Screen: Low Risk  (07/03/2022)  Depression (PHQ2-9): Low Risk  (07/17/2022)  Financial Resource Strain: Low Risk  (09/04/2022)  Physical Activity: Inactive (05/05/2022)  Social Connections: Socially Isolated (04/17/2021)  Stress: No Stress Concern Present (05/05/2022)  Tobacco Use: Low Risk  (09/04/2022)   Westley Hummer, MSW, [place]  [phone]- work cell phone (preferred) [phone]- desk phone

## 2022-09-16 NOTE — Telephone Encounter (Signed)
H&V Care Navigation CSW Progress Note  Clinical Social Worker  received a call back from pt  to update me that he has received his water bill and it is still in excess of $289.96 for the month. Pt and this Probation officer called the [employer] to speak with representative. Understandably pt is frustrated however was verbally unkind with representative despite offers that they provided to go out and see what is going on with the water meter. Pt did stated they could come out but did not promise that he will be home if they come. At this time unless pt able to have city come out and see meter and work with plumbing further then we are limited in assistance- as the water will continue to be elevated in price. I will speak with team lead when back in the office about next steps.   Patient is participating in a Managed Medicaid Plan:  No Aetna Medicare only.   SDOH Screenings   Food Insecurity: No Food Insecurity (09/04/2022)  Housing: Low Risk  (07/03/2022)  Transportation Needs: No Transportation Needs (09/04/2022)  Utilities: At Risk (09/04/2022)  Alcohol Screen: Low Risk  (07/03/2022)  Depression (PHQ2-9): Low Risk  (07/17/2022)  Financial Resource Strain: Low Risk  (09/04/2022)  Physical Activity: Inactive (05/05/2022)  Social Connections: Socially Isolated (04/17/2021)  Stress: No Stress Concern Present (05/05/2022)  Tobacco Use: Low Risk  (09/04/2022)    Westley Hummer, MSW, [place]  [phone]- work cell phone (preferred) [phone]- desk phone

## 2022-09-20 DIAGNOSIS — I10 Essential (primary) hypertension: Secondary | ICD-10-CM

## 2022-10-02 ENCOUNTER — Telehealth: Payer: Self-pay | Admitting: Licensed Clinical Social Worker

## 2022-10-02 NOTE — Telephone Encounter (Signed)
H&V Care Navigation CSW Progress Note  Clinical Social Worker contacted patient by phone to f/u on ongoing challenges w/ water bill. No answer at [phone], left voicemail requesting call back. Will re-attempt as able.    Patient is participating in a Managed Medicaid Plan:  No, Aetna Medicare  [place]: No Food Insecurity (09/04/2022)  Housing: Low Risk  (07/03/2022)  Transportation Needs: No Transportation Needs (09/04/2022)  Utilities: At Risk (09/04/2022)  Alcohol Screen: Low Risk  (07/03/2022)  Depression (PHQ2-9): Low Risk  (07/17/2022)  Financial Resource Strain: Low Risk  (09/04/2022)  Physical Activity: Inactive (05/05/2022)  Social Connections: Socially Isolated (04/17/2021)  Stress: No Stress Concern Present (05/05/2022)  Tobacco Use: Low Risk  (09/04/2022)    Westley Hummer, MSW, [place]  [phone]- work cell phone (preferred) [phone]- desk phone

## 2022-10-04 ENCOUNTER — Other Ambulatory Visit: Payer: Self-pay | Admitting: Family Medicine

## 2022-10-04 DIAGNOSIS — I10 Essential (primary) hypertension: Secondary | ICD-10-CM

## 2022-10-06 ENCOUNTER — Telehealth: Payer: Self-pay

## 2022-10-06 DIAGNOSIS — Z0000 Encounter for general adult medical examination without abnormal findings: Secondary | ICD-10-CM

## 2022-10-06 NOTE — Telephone Encounter (Signed)
Called patient to ensure that his prompt times for 9am and 8pm works with his schedule to check his blood pressure twice daily using the Viv[employer] cuff since we are missing 4 days of evening readings. Patient stated that those times works for his schedule. Patient explained that his missing pm readings was due to being out of town. Reminded patient again to check his blood pressure twice daily. Patient verbally agreed.   [name] [facility] Guide, Health Coach [address], [address] [place] [address] Telephone: [phone] Email: [email or web address]

## 2022-10-08 ENCOUNTER — Telehealth: Payer: Self-pay | Admitting: Licensed Clinical Social Worker

## 2022-10-08 NOTE — Telephone Encounter (Signed)
H&V Care Navigation CSW Progress Note  Clinical Social Worker  was contacted by pt  to f/u with me about his water bill. The city has turned his water off- he states that he is trying to get in touch with his plumber to get an invoice of what was done and the date so that he can go down to water services and speak with them. I inquired if  they had gone by to check the meter as previously discussed. Pt did not provide clear yes or no answer- at this time pt needs to go see what could be done to remedy the turn off. Once he is clear about options then he was encouraged to call us back- unsure how patient care fund could be used at this time. Once pt has updates will speak with team lead.     Patient is participating in a Managed Medicaid Plan:  No, Aetna Medicare only.   SDOH Screenings   Food Insecurity: No Food Insecurity (09/04/2022)  Housing: Low Risk  (07/03/2022)  Transportation Needs: No Transportation Needs (09/04/2022)  Utilities: At Risk (09/04/2022)  Alcohol Screen: Low Risk  (07/03/2022)  Depression (PHQ2-9): Low Risk  (07/17/2022)  Financial Resource Strain: Low Risk  (09/04/2022)  Physical Activity: Inactive (05/05/2022)  Social Connections: Socially Isolated (04/17/2021)  Stress: No Stress Concern Present (05/05/2022)  Tobacco Use: Low Risk  (09/04/2022)    Westley Hummer, MSW, [place]  [phone]- work cell phone (preferred) [phone]- desk phone

## 2022-10-31 ENCOUNTER — Telehealth: Payer: Self-pay | Admitting: Family Medicine

## 2022-10-31 ENCOUNTER — Telehealth: Payer: Self-pay

## 2022-10-31 NOTE — Telephone Encounter (Signed)
Contacted patient's cardiology office regarding to medical clearance for dental treatment form.   Inform them, Dr. Volanda Napoleon feels that it is best for his heart dr to fill out the form since he was last seen with them and that his medication was changed at that visit.   Faxed the form to the office with high priority. Antt: Dr. Oval Linsey. Received a confirmation.    Patient was aware that we would have his heart doctor to fill out the form.

## 2022-10-31 NOTE — Telephone Encounter (Signed)
Pt stated the dental office called him and stated they still haven't received any medical ppwk from our office to ensure pt is able to undertake a dental procedure.   Pt stated ppwk was sent to [place] about 2 mths ago and he is upset that it hasn't been done yet. I asked if he dropped off the [place] or requested [place] to be done by [place] and he stated he requested it 2 mths ago.   He couldn't specify what type of [place] it was he just said to make sure his BP is good.   Please advise.

## 2022-10-31 NOTE — Telephone Encounter (Signed)
Form received, however this provider was out of the office until 10/29/22.  BP was uncontrolled at last OFV with this provider.  Form will need to be sent to pt's Cardiologist as they are currently managing his hypertension.

## 2022-10-31 NOTE — Telephone Encounter (Signed)
Les from [facility] called to see if we recevied a fax on 10/15/2022 for a Medical Clearance form.   Romilda Joy will be resending the form through fax today (10/31/22)  Please advise

## 2022-11-03 ENCOUNTER — Ambulatory Visit (HOSPITAL_BASED_OUTPATIENT_CLINIC_OR_DEPARTMENT_OTHER): Payer: Medicare HMO | Admitting: Cardiovascular Disease

## 2022-11-03 ENCOUNTER — Telehealth: Payer: Self-pay | Admitting: Licensed Clinical Social Worker

## 2022-11-03 ENCOUNTER — Telehealth (HOSPITAL_BASED_OUTPATIENT_CLINIC_OR_DEPARTMENT_OTHER): Payer: Self-pay

## 2022-11-03 ENCOUNTER — Encounter (HOSPITAL_BASED_OUTPATIENT_CLINIC_OR_DEPARTMENT_OTHER): Payer: Self-pay | Admitting: Cardiovascular Disease

## 2022-11-03 VITALS — BP 202/88 | Ht 66.5 in | Wt 163.3 lb

## 2022-11-03 DIAGNOSIS — R011 Cardiac murmur, unspecified: Secondary | ICD-10-CM | POA: Diagnosis not present

## 2022-11-03 DIAGNOSIS — I10 Essential (primary) hypertension: Secondary | ICD-10-CM

## 2022-11-03 DIAGNOSIS — I6523 Occlusion and stenosis of bilateral carotid arteries: Secondary | ICD-10-CM | POA: Diagnosis not present

## 2022-11-03 DIAGNOSIS — Z006 Encounter for examination for normal comparison and control in clinical research program: Secondary | ICD-10-CM

## 2022-11-03 MED ORDER — DOXAZOSIN MESYLATE 8 MG PO TABS: 8.0000 mg | ORAL_TABLET | Freq: Every day | ORAL | 3 refills | Status: DC

## 2022-11-03 NOTE — Assessment & Plan Note (Signed)
Echo shows aortic valve sclerosis but no stenosis.  Continue to follow.

## 2022-11-03 NOTE — Assessment & Plan Note (Addendum)
BP remains uncontrolled but improving.  His BP is much better controlled at home than it is here.  He seems to have white coat hypertension superimposed on resistant hypertension.  Abilify was in the 130s.  He will bring his machine to follow-up next month.  On average.  Still above goal at home as well.  Will increase doxazosin to 8 mg.  Continue amlodipine, carvedilol, and hydralazine.  Renal function limits other options.  He is very physically active.  Continue to limit sodium intake.

## 2022-11-03 NOTE — Patient Instructions (Addendum)
Medication Instructions:  INCREASE YOUR DOXAZOSIN TO 8 MG AT BEDTIME   Labwork: NONE  Testing/Procedures: NONE   Follow-Up: 12/04/2022 8:50 AM WITH CAITLIN W NP   Any Other Special Instructions Will Be Listed Below (If Applicable). CONTINUE TO MONITOR YOUR BLOOD PRESSURE TWICE A DAY AT HOME. BRING YOUR MACHINE TO YOUR FOLLOW UP   If you need a refill on your cardiac medications before your next appointment, please call your pharmacy.

## 2022-11-03 NOTE — Research (Signed)
I saw pt today after Dr. Blenda Mounts follow up visit. Pt is in Dr. Blenda Mounts Virtual Care HTN Study. Pt filled out research survey. Pt is enrolled in Group 2. Pt will follow up in 1 month.

## 2022-11-03 NOTE — Progress Notes (Signed)
Cardiology Office Note:    Date:  11/03/2022   ID:  [name], DOB 02-Nov-1950, MRN [id number]  PCP:  [name], MD   [facility] Providers Cardiologist:  None     Referring MD: [name], MD   Hypertension  History of Present Illness:    [name] is a 72 y.o. male with a hx of DM2, HLD, HTN, CKD here to follow up in the Advanced Hypertension Clinic.    [name] was diagnosed with hypertension 4-5 years ago. It has been difficult to control. Prior renal duplex 04/2021 with no stenosis. Blood pressure checked with arm cuff at home. Readings have been 180s. Home BP cuff has been checked for accuracy.  No alcohol nor tobacco use.. he eats at home and outside of the home. 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 He used to work as a Training and development officer for work and though reports following low salt diet often eats out, eats premade meals, or seasons food with salt. Follows with nephrology and is concerned regarding his renal function and blood pressure. Previous notes from primary care state he often does not give medications much of a chance prior to stopping or requesting change.    Established with advanced hypertension clinic 07/03/22. He was enrolled in remote patient monitoring. Metoprolol increased and later transitioned to Coreg '25mg'$  BID. Hydralazine increased to '100mg'$  TID. Diltiazem later transitioned to Amlodipine. Carotid duplex 08/14/22 bilateral 1-39% stenosis. Echo 08/14/22 normal LVEF 60-65%, mild AI with aortic valve sclerosis but no stenosis, gr1DD, RV normal, LA mildly dilated, mild MR. Renin aldosterone, catecholamines, metanephrines, and cortisol were all normal.  He had some sleep difficulty. His BP at home with RPM had ranged from 106-190s. He last saw Laurann Montana 08/2022 and amlodipine was increased to 10 mg. Doxazosin was added. Recent reading ambifiy have ranged from the 130s to  170s.  Today, he reports doing well overall. His BP this morning was lower than that showed in clinic. His BP in clinic today was 199/95. Upon recheck, his BP is 202/88.  He has taken his antihypertensives today. His BP reading yesterday was 130/74. He may have white coat hypertension. He had a very low BP reading soon after [name]. On average, his BP readings at home range 536-644 systolic. He notes one episode where his Left arm BP was in the 034V systolic. He has some occasional sleep difficulty. He takes a Tylenol occasionally to improve symptoms.His kidney function is stable. His cholesterol is improving. His most recent LDL level was 98. His Creatinine was 1.89. He gets plenty of exercise through activities such as walking and running without any anginal symptoms. He cooks at home and does not use salt. He occasionally has coffee and does not drink alcohol. He denies any palpitations, chest pain, shortness of breath, or peripheral edema. No lightheadedness, headaches, syncope, orthopnea, or PND.    Previous antihypertensives: Triamterene-HCTZ - discontinued due to renal function Amlodipine - unclear whether he ever picked up from pharmacy    Past Medical History:  Diagnosis Date   CKD (chronic kidney disease)    Diabetes mellitus without complication ([place])    Hyperlipidemia    Hypertension    Insomnia    Prostate cancer [facility])     Past Surgical History:  Procedure Laterality Date   PROSTATE BIOPSY      Current Medications: Current Meds  Medication Sig   amLODipine (NORVASC) 10 MG  tablet Take 1 tablet (10 mg total) by mouth daily.   atorvastatin (LIPITOR) 40 MG tablet TAKE 1 TABLET BY MOUTH EVERY DAY   blood glucose meter kit and supplies Dispense based on patient and insurance preference. Use to check glucose daily (FOR ICD-10 E10.9, E11.9).   carvedilol (COREG) 25 MG tablet Take 1 tablet (25 mg total) by mouth 2 (two) times daily.   Cholecalciferol (VITAMIN D3) 50 MCG (2000  UT) TABS Take 1 tablet by mouth daily.   Continuous Blood Gluc Receiver (FREESTYLE LIBRE 2 READER) DEVI 1 Device by Does not apply route as directed.   Continuous Blood Gluc Sensor (FREESTYLE LIBRE 3 SENSOR) MISC 1 Device by Does not apply route every 14 (fourteen) days. Place 1 sensor on the skin every 14 days. Use to check glucose continuously   dapagliflozin propanediol (FARXIGA) 5 MG TABS tablet Take 1 tablet (5 mg total) by mouth daily before breakfast.   ferrous gluconate (FERGON) 324 MG tablet TAKE 1 TABLET BY MOUTH DAILY WITH BREAKFAST   hydrALAZINE (APRESOLINE) 100 MG tablet Take 1 tablet (100 mg total) by mouth 3 (three) times daily.   Insulin Pen Needle (BD PEN NEEDLE NANO 2ND GEN) 32G X 4 MM MISC To use with insulin pen twice a day.   Lancet Devices (ONE TOUCH DELICA LANCING DEV) MISC 1 Device by Does not apply route as directed.   latanoprost (XALATAN) 0.005 % ophthalmic solution SMARTSIG:In Eye(s)   NOVOLIN 70/30 KWIKPEN (70-30) 100 UNIT/ML KwikPen INJECT 20 UNITS INTO THE SKIN DAILY BEFORE BREAKFAST AND 24 UNITS AT BEDTIME.   OneTouch Delica Lancets 62I MISC 1 Device by Does not apply route as directed. Use to test up to 4x daily.   ONETOUCH ULTRA test strip USE UP TO 4X DAILY   ramelteon (ROZEREM) 8 MG tablet Take 1 tablet (8 mg) 30 minutes before bedtime.   [DISCONTINUED] doxazosin (CARDURA) 4 MG tablet Take 1 tablet (4 mg total) by mouth at bedtime.     Allergies:   Patient has no known allergies.   Social History   Socioeconomic History   Marital status: Single    Spouse name: Not on file   Number of children: 2   Years of education: Not on file   Highest education level: Not on file  Occupational History    Comment: retired  Tobacco Use   Smoking status: Never   Smokeless tobacco: Never  Vaping Use   Vaping Use: Never used  Substance and Sexual Activity   Alcohol use: No   Drug use: No   Sexual activity: Yes  Other Topics Concern   Not on file  Social  History Narrative   Not on file   Social Determinants of Health   Financial Resource Strain: Low Risk  (09/04/2022)   Overall Financial Resource Strain (CARDIA)    Difficulty of Paying Living Expenses: Not very hard  Food Insecurity: No Food Insecurity (09/04/2022)   Hunger Vital Sign    Worried About Running Out of Food in the Last Year: Never true    Ran Out of Food in the Last Year: Never true  Transportation Needs: No Transportation Needs (09/04/2022)   PRAPARE - Hydrologist (Medical): No    Lack of Transportation (Non-Medical): No  Physical Activity: Inactive (05/05/2022)   Exercise Vital Sign    Days of Exercise per Week: 0 days    Minutes of Exercise per Session: 0 min  Stress: No Stress Concern Present (  05/05/2022)   [place]    Feeling of Stress : Not at all  Social Connections: Socially Isolated (04/17/2021)   Social Connection and Isolation Panel [NHANES]    Frequency of Communication with Friends and Family: Twice a week    Frequency of Social Gatherings with Friends and Family: Twice a week    Attends Religious Services: Never    Marine scientist or Organizations: No    Attends Music therapist: Never    Marital Status: Divorced     Family History: The patient's family history includes Colon cancer in his mother; Diabetes in his brother; Hypertension in his daughter and mother. There is no history of Breast cancer, Pancreatic cancer, or Prostate cancer.  ROS:   Please see the history of present illness.    (+) sleep difficulty  All other systems reviewed and are negative.  EKGs/Labs/Other Studies Reviewed:    The following studies were reviewed today:  Echo 08/14/22 The aortic valve is tricuspid. There is moderate calcification of the  aortic valve. There is mild thickening of the aortic valve. Aortic valve  regurgitation is mild. Aortic valve  sclerosis/calcification is present,  without any evidence of aortic  stenosis. Aortic valve mean gradient measures 9.0 mmHg. Aortic valve Vmax  measures 2.00 m/s.   2. Left ventricular ejection fraction, by estimation, is 60 to 65%. The  left ventricle has normal function. The left ventricle has no regional  wall motion abnormalities. Left ventricular diastolic parameters are  consistent with Grade I diastolic  dysfunction (impaired relaxation). The average left ventricular global  longitudinal strain is -17.1 %. The global longitudinal strain is normal.   3. Right ventricular systolic function is normal. The right ventricular  size is normal.   4. Left atrial size was mildly dilated.   5. The mitral valve is normal in structure. Mild mitral valve  regurgitation. No evidence of mitral stenosis. Moderate mitral annular  calcification.   6. The inferior vena cava is normal in size with greater than 50%  respiratory variability, suggesting right atrial pressure of 3 mmHg.    Carotid duplex 08/14/22 Right Carotid: Velocities in the right ICA are consistent with a 1-39%  stenosis.   Left Carotid: Velocities in the left ICA are consistent with a 1-39%  stenosis.   Vertebrals: Bilateral vertebral arteries demonstrate antegrade flow.  Subclavians: Normal flow hemodynamics were seen in bilateral subclavian               arteries.  EKG:  EKG is personally reviewed. 11/03/22: EKG was not ordered. 07/03/2022: NSR 71 bpm with no acute ST/T wave changes.     Recent Labs: 04/10/2022: TSH 1.35 05/16/2022: Hemoglobin 11.5; Platelets 217.0 05/29/2022: BUN 31; Creatinine, Ser 1.89; Potassium 4.7; Sodium 141 09/04/2022: ALT 16   Recent Lipid Panel    Component Value Date/Time   CHOL 179 04/10/2022 1014   TRIG 68.0 04/10/2022 1014   HDL 53.70 04/10/2022 1014   CHOLHDL 3 04/10/2022 1014   VLDL 13.6 04/10/2022 1014   LDLCALC 111 (H) 04/10/2022 1014   LDLCALC 70 09/07/2020 1038   LDLDIRECT 98  09/04/2022 1152     Risk Assessment/Calculations:      HYPERTENSION CONTROL Vitals:   11/03/22 1126 11/03/22 1127 11/03/22 1144  BP: (!) 207/94 (!) 199/95 (!) 202/88    The patient's blood pressure is elevated above target today.  In order to address the patient's elevated BP: A current  anti-hypertensive medication was adjusted today.            Physical Exam:    VS:  BP (!) 202/88 (BP Location: Right Arm, Patient Position: Sitting, Cuff Size: Normal)   Ht 5' 6.5" (1.689 m)   Wt 163 lb 4.8 oz (74.1 kg)   BMI 25.96 kg/m     Wt Readings from Last 3 Encounters:  11/03/22 163 lb 4.8 oz (74.1 kg)  09/04/22 160 lb 4.8 oz (72.7 kg)  08/18/22 159 lb (72.1 kg)     VS:  BP (!) 202/88 (BP Location: Right Arm, Patient Position: Sitting, Cuff Size: Normal)   Ht 5' 6.5" (1.689 m)   Wt 163 lb 4.8 oz (74.1 kg)   BMI 25.96 kg/m  , BMI Body mass index is 25.96 kg/m. GENERAL:  Well appearing HEENT: Pupils equal round and reactive, fundi not visualized, oral mucosa unremarkable NECK:  No jugular venous distention, waveform within normal limits, carotid upstroke brisk and symmetric, no bruits, no thyromegaly LYMPHATICS:  No cervical adenopathy LUNGS:  Clear to auscultation bilaterally HEART:  RRR.  PMI not displaced or sustained,S1 and S2 within normal limits, no S3, no S4, no clicks, no rubs, no murmurs ABD:  Flat, positive bowel sounds normal in frequency in pitch, no bruits, no rebound, no guarding, no midline pulsatile mass, no hepatomegaly, no splenomegaly EXT:  2 plus pulses throughout, no edema, no cyanosis no clubbing SKIN:  No rashes no nodules NEURO:  Cranial nerves II through XII grossly intact, motor grossly intact throughout PSYCH:  Cognitively intact, oriented to person place and time   ASSESSMENT:    1. Murmur   2. Essential hypertension   3. Bilateral carotid artery stenosis    PLAN:    Essential hypertension BP remains uncontrolled but improving.  His BP is  much better controlled at home than it is here.  He seems to have white coat hypertension superimposed on resistant hypertension.  Abilify was in the 130s.  He will bring his machine to follow-up next month.  On average.  Still above goal at home as well.  Will increase doxazosin to 8 mg.  Continue amlodipine, carvedilol, and hydralazine.  Renal function limits other options.  He is very physically active.  Continue to limit sodium intake.   Murmur Echo shows aortic valve sclerosis but no stenosis.  Continue to follow.  Bilateral carotid artery stenosis Mild carotid stenosis.  Continue atorvastatin.  LDL goal is less than 70.   Disposition: FU with Deserea Bordley C. Oval Linsey, MD, [facility] in 1-2 months.  Medication Adjustments/Labs and Tests Ordered: Current medicines are reviewed at length with the patient today.  Concerns regarding medicines are outlined above.  Orders Placed This Encounter  Procedures   Cantril's Ladder Assessment   Meds ordered this encounter  Medications   doxazosin (CARDURA) 8 MG tablet    Sig: Take 1 tablet (8 mg total) by mouth at bedtime.    Dispense:  90 tablet    Refill:  3    DC '4mg'$  tablet    Patient Instructions  Medication Instructions:  INCREASE YOUR DOXAZOSIN TO 8 MG AT BEDTIME   Labwork: NONE  Testing/Procedures: NONE   Follow-Up: 12/04/2022 8:50 AM WITH CAITLIN W NP   Any Other Special Instructions Will Be Listed Below (If Applicable). CONTINUE TO MONITOR YOUR BLOOD PRESSURE TWICE A DAY AT HOME. BRING YOUR MACHINE TO YOUR FOLLOW UP   If you need a refill on your cardiac medications before your next appointment,  please call your pharmacy.       I,Mitra Faeizi,acting as a Education administrator for [place], MD.,have documented all relevant documentation on the behalf of Skeet Latch, MD,as directed by  Skeet Latch, MD while in the presence of Skeet Latch, MD.  I, [place] Oval Linsey, MD have reviewed all documentation for this visit.  The  documentation of the exam, diagnosis, procedures, and orders on 11/03/2022 are all accurate and complete.    Signed, Skeet Latch, MD  11/03/2022 12:21 PM    [place]

## 2022-11-03 NOTE — Assessment & Plan Note (Signed)
Mild carotid stenosis.  Continue atorvastatin.  LDL goal is less than 70.

## 2022-11-03 NOTE — Telephone Encounter (Signed)
H&V Care Navigation CSW Progress Note  Clinical Social Worker  received call from pt  to let me know that the city has accepted his notice of repair for the leak and he will hopefully be credited something towards his bill. He may have to pay a bit more on his monthly bill moving forward. Once he receives notice of his credit and has a monthly bill I requested he call back and let me know. If he doesn't hear back then we will be in touch next week, three way call the [facility] and see if we can further assist.   Patient is participating in a Managed Medicaid Plan:  No, Aetna Medicare  [place]: No Food Insecurity (09/04/2022)  Housing: Low Risk  (07/03/2022)  Transportation Needs: No Transportation Needs (09/04/2022)  Utilities: At Risk (09/04/2022)  Alcohol Screen: Low Risk  (07/03/2022)  Depression (PHQ2-9): Low Risk  (07/17/2022)  Financial Resource Strain: Low Risk  (09/04/2022)  Physical Activity: Inactive (05/05/2022)  Social Connections: Socially Isolated (04/17/2021)  Stress: No Stress Concern Present (05/05/2022)  Tobacco Use: Low Risk  (11/03/2022)   Westley Hummer, MSW, LCSW Clinical Social Worker [place]  [phone]- work cell phone (preferred) [phone]- desk phone

## 2022-11-03 NOTE — Telephone Encounter (Signed)
   Pre-operative Risk Assessment    Patient Name: [name]  DOB: 01/26/1951 MRN: [id number]     Request for Surgical Clearance    Procedure:  Dental Extraction - Amount of Teeth to be Pulled:  7  Date of Surgery:  Clearance TBD                                 Surgeon:  Dr. Martinique [name] Surgeon's Group or Practice Name:  [facility] Phone number:  [phone] Fax number:  [phone]   Type of Clearance Requested:   - Medical    Type of Anesthesia:  Local  with epinephrine   Additional requests/questions:   None  SignedFrancella Solian   11/03/2022, 9:15 AM

## 2022-11-03 NOTE — Telephone Encounter (Signed)
Form was faxed to pt's cardiologist.

## 2022-11-07 DIAGNOSIS — R030 Elevated blood-pressure reading, without diagnosis of hypertension: Secondary | ICD-10-CM | POA: Diagnosis not present

## 2022-11-07 DIAGNOSIS — E1142 Type 2 diabetes mellitus with diabetic polyneuropathy: Secondary | ICD-10-CM | POA: Diagnosis not present

## 2022-11-07 DIAGNOSIS — E1151 Type 2 diabetes mellitus with diabetic peripheral angiopathy without gangrene: Secondary | ICD-10-CM | POA: Diagnosis not present

## 2022-11-07 DIAGNOSIS — N529 Male erectile dysfunction, unspecified: Secondary | ICD-10-CM | POA: Diagnosis not present

## 2022-11-07 DIAGNOSIS — R69 Illness, unspecified: Secondary | ICD-10-CM | POA: Diagnosis not present

## 2022-11-07 DIAGNOSIS — E785 Hyperlipidemia, unspecified: Secondary | ICD-10-CM | POA: Diagnosis not present

## 2022-11-07 DIAGNOSIS — N1832 Chronic kidney disease, stage 3b: Secondary | ICD-10-CM | POA: Diagnosis not present

## 2022-11-07 DIAGNOSIS — E1122 Type 2 diabetes mellitus with diabetic chronic kidney disease: Secondary | ICD-10-CM | POA: Diagnosis not present

## 2022-11-07 DIAGNOSIS — K219 Gastro-esophageal reflux disease without esophagitis: Secondary | ICD-10-CM | POA: Diagnosis not present

## 2022-11-07 DIAGNOSIS — H259 Unspecified age-related cataract: Secondary | ICD-10-CM | POA: Diagnosis not present

## 2022-11-07 DIAGNOSIS — E559 Vitamin D deficiency, unspecified: Secondary | ICD-10-CM | POA: Diagnosis not present

## 2022-11-07 DIAGNOSIS — H409 Unspecified glaucoma: Secondary | ICD-10-CM | POA: Diagnosis not present

## 2022-11-07 DIAGNOSIS — F419 Anxiety disorder, unspecified: Secondary | ICD-10-CM | POA: Diagnosis not present

## 2022-11-12 NOTE — Telephone Encounter (Signed)
   Patient Name: [name]  DOB: Feb 07, 1951 MRN: [id number]  Primary Cardiologist: None  Chart reviewed as part of pre-operative protocol coverage. Given past medical history and time since last visit, based on ACC/AHA guidelines, [name] is at acceptable risk for the planned procedure without further cardiovascular testing.  He was seen recently by Dr. Oval Linsey on 11/03/2022 and was deemed low risk for his upcoming dental extraction procedure.  The patient was advised that if he develops new symptoms prior to surgery to contact our office to arrange for a follow-up visit, and he verbalized understanding.  I will route this recommendation to the requesting party via Epic fax function and remove from pre-op pool.  Please call with questions.  Mable Fill, Marissa Nestle, NP 11/12/2022, 8:18 AM

## 2022-11-17 ENCOUNTER — Telehealth: Payer: Self-pay | Admitting: Licensed Clinical Social Worker

## 2022-11-17 NOTE — Telephone Encounter (Signed)
H&V Care Navigation CSW Progress Note  Clinical Social Worker contacted patient by phone to f/u on bill credit for water. Reached pt at [phone]- pt states he hasn't had any updates. Encouraged him to call if he does hear so we can still review for assistance- due to Patient Care Fund guidelines we need to know leak fixed to assist with bill assistance.   Patient is participating in a Managed Medicaid Plan:  No, Aetna Medicare  [place]: No Food Insecurity (09/04/2022)  Housing: Low Risk  (07/03/2022)  Transportation Needs: No Transportation Needs (09/04/2022)  Utilities: At Risk (09/04/2022)  Alcohol Screen: Low Risk  (07/03/2022)  Depression (PHQ2-9): Low Risk  (07/17/2022)  Financial Resource Strain: Low Risk  (09/04/2022)  Physical Activity: Inactive (05/05/2022)  Social Connections: Socially Isolated (04/17/2021)  Stress: No Stress Concern Present (05/05/2022)  Tobacco Use: Low Risk  (11/03/2022)   Westley Hummer, MSW, LCSW Clinical Social Worker [place]  [phone]- work cell phone (preferred) [phone]- desk phone

## 2022-11-19 ENCOUNTER — Telehealth: Payer: Self-pay | Admitting: Licensed Clinical Social Worker

## 2022-11-19 NOTE — Telephone Encounter (Signed)
H&V Care Navigation CSW Progress Note  Clinical Social Worker  received a call back from pt  to update me on ongoing water bill challenges. Pt states that he received his newest bill and it is $89. He is still incredibly frustrated by this amount stating he is the only person in the home. I provided support for pt frustrations but that I do not work for the city so I cannot judge whether or not that was a reasonable bill but it does seem considerably less than previous bills shared and it seems perhaps the leak has been remedied. He is still very verbally upset at cost. I shared that that if he is interested in assistance he can submit his bill for Patient Care Fund assistance. We would need the bill, his proof of income and for him to sign our Patient Care Fund agreement. He is going to go to water services again and then will call me.   Patient is participating in a Managed Medicaid Plan:  No, Aetna Medicare.   SDOH Screenings   Food Insecurity: No Food Insecurity (09/04/2022)  Housing: Low Risk  (07/03/2022)  Transportation Needs: No Transportation Needs (09/04/2022)  Utilities: At Risk (09/04/2022)  Alcohol Screen: Low Risk  (07/03/2022)  Depression (PHQ2-9): Low Risk  (07/17/2022)  Financial Resource Strain: Low Risk  (09/04/2022)  Physical Activity: Inactive (05/05/2022)  Social Connections: Socially Isolated (04/17/2021)  Stress: No Stress Concern Present (05/05/2022)  Tobacco Use: Low Risk  (11/03/2022)   [name], MSW, LCSW Clinical Social Worker [place]  [phone]- work cell phone (preferred) [phone]- desk phone

## 2022-11-21 ENCOUNTER — Telehealth: Payer: Self-pay | Admitting: Cardiovascular Disease

## 2022-11-21 NOTE — Telephone Encounter (Signed)
Pt has an appt with dr banks on 11-26-2022 and Pt is aware he needs to contact cardiologist concerning dental medical clearance

## 2022-11-21 NOTE — Telephone Encounter (Signed)
Patient called stating his dentist never received clearance for his dental extraction. See phone note dated 11/03/22

## 2022-11-21 NOTE — Telephone Encounter (Signed)
Pt has an appt on 11-26-2022 and would like to discuss getting  dental medical clearance. Pt is aware to contact his cardiologist

## 2022-11-21 NOTE — Telephone Encounter (Signed)
I called the pt and assured him that our office did fax over notes to [place] on 11/03/22. I will be happy to re-fax notes again today. Pt said thank you.

## 2022-11-21 NOTE — Telephone Encounter (Signed)
Pt called to say the heart doctor already completed the form and sent it to the dentist.  Pt states the dentist is saying they need MD to also give Pt clearance.   Please advise.  Pt will call back with Dentist's phone number

## 2022-11-21 NOTE — Telephone Encounter (Signed)
Contacted pt's dentist. Spoke to Benjamine Mola and inform her Dr. Volanda Napoleon have pt's cardiologist to fill out the medical clearance as they are managing pt's hypertension med.   She states she will let pt's dentist know and follow up with [place] if need anything.

## 2022-11-21 NOTE — Telephone Encounter (Signed)
Please call Dentist at [phone]   Pt is adamant about MD sending clearance.   Pt states he has been asking for 3 months and he is running out of time, among other things.

## 2022-11-21 NOTE — Telephone Encounter (Signed)
Already spoke with dentist's office. See note.

## 2022-11-21 NOTE — Telephone Encounter (Signed)
Please call Dentist at [phone]  Pt is adamant about MD sending clearance.  Pt states he has been asking for 3 months and he is running out of time, among other things.

## 2022-11-26 ENCOUNTER — Ambulatory Visit (INDEPENDENT_AMBULATORY_CARE_PROVIDER_SITE_OTHER): Payer: Medicare HMO | Admitting: Family Medicine

## 2022-11-26 ENCOUNTER — Encounter: Payer: Self-pay | Admitting: Family Medicine

## 2022-11-26 VITALS — BP 180/100 | HR 75 | Temp 98.0°F | Ht 66.5 in | Wt 160.8 lb

## 2022-11-26 DIAGNOSIS — R42 Dizziness and giddiness: Secondary | ICD-10-CM

## 2022-11-26 DIAGNOSIS — I10 Essential (primary) hypertension: Secondary | ICD-10-CM | POA: Diagnosis not present

## 2022-11-26 DIAGNOSIS — R5383 Other fatigue: Secondary | ICD-10-CM | POA: Diagnosis not present

## 2022-11-26 DIAGNOSIS — D509 Iron deficiency anemia, unspecified: Secondary | ICD-10-CM | POA: Diagnosis not present

## 2022-11-26 DIAGNOSIS — E1142 Type 2 diabetes mellitus with diabetic polyneuropathy: Secondary | ICD-10-CM | POA: Diagnosis not present

## 2022-11-26 LAB — BASIC METABOLIC PANEL
BUN: 29 mg/dL — ABNORMAL HIGH (ref 6–23)
CO2: 31 mEq/L (ref 19–32)
Calcium: 9.2 mg/dL (ref 8.4–10.5)
Chloride: 102 mEq/L (ref 96–112)
Creatinine, Ser: 2.03 mg/dL — ABNORMAL HIGH (ref 0.40–1.50)
GFR: 32.61 mL/min — ABNORMAL LOW (ref >60.00)
Glucose, Bld: 255 mg/dL — ABNORMAL HIGH (ref 70–99)
Potassium: 4 mEq/L (ref 3.5–5.1)
Sodium: 140 mEq/L (ref 135–145)

## 2022-11-26 LAB — POCT GLYCOSYLATED HEMOGLOBIN (HGB A1C): Hemoglobin A1C: 7.9 % — AB (ref 4.0–5.6)

## 2022-11-26 LAB — CBC WITH DIFFERENTIAL/PLATELET
Basophils Absolute: 0 10*3/uL (ref 0.0–0.1)
Basophils Relative: 0.5 % (ref 0.0–3.0)
Eosinophils Absolute: 0.2 10*3/uL (ref 0.0–0.7)
Eosinophils Relative: 3.7 % (ref 0.0–5.0)
HCT: 32.5 % — ABNORMAL LOW (ref 39.0–52.0)
Hemoglobin: 11.6 g/dL — ABNORMAL LOW (ref 13.0–17.0)
Lymphocytes Relative: 15.5 % (ref 12.0–46.0)
Lymphs Abs: 0.6 10*3/uL — ABNORMAL LOW (ref 0.7–4.0)
MCHC: 35.6 g/dL (ref 30.0–36.0)
MCV: 88.5 fl (ref 78.0–100.0)
Monocytes Absolute: 0.4 10*3/uL (ref 0.1–1.0)
Monocytes Relative: 10.3 % (ref 3.0–12.0)
Neutro Abs: 2.9 10*3/uL (ref 1.4–7.7)
Neutrophils Relative %: 70 % (ref 43.0–77.0)
Platelets: 240 10*3/uL (ref 150.0–400.0)
RBC: 3.67 Mil/uL — ABNORMAL LOW (ref 4.22–5.81)
RDW: 13.4 % (ref 11.5–15.5)
WBC: 4.2 10*3/uL (ref 4.0–10.5)

## 2022-11-26 LAB — TSH: TSH: 1.77 u[IU]/mL (ref 0.35–5.50)

## 2022-11-26 LAB — HEMOGLOBIN A1C: Hgb A1c MFr Bld: 7.9 % — ABNORMAL HIGH (ref 4.6–6.5)

## 2022-11-26 MED ORDER — HYDRALAZINE HCL 50 MG PO TABS: 50.0000 mg | ORAL_TABLET | Freq: Three times a day (TID) | ORAL | 1 refills | Status: DC

## 2022-11-26 MED ORDER — CARVEDILOL PHOSPHATE ER 80 MG PO CP24: 80.0000 mg | ORAL_CAPSULE | Freq: Every day | ORAL | 1 refills | Status: DC

## 2022-11-26 MED ORDER — FERROUS GLUCONATE 324 (38 FE) MG PO TABS: 324.0000 mg | ORAL_TABLET | Freq: Every day | ORAL | 1 refills | Status: DC

## 2022-11-26 NOTE — Patient Instructions (Addendum)
Given your dizziness we will check labs to make sure were not having any symptoms from anemia.  A prescription for carvedilol (Coreg CR) 80 mg daily was sent to your pharmacy.  This is the extended release version of the carvedilol that you are taking now.  You can finish taking the carvedilol 25 mg twice a day that you have at home then start this new prescription.  Even though it is extended release version it is equivalent to what you are taking now.  Continue taking the rest of your blood pressure medications as prescribed.  As you feel the hydralazine causing dizziness and other symptoms we will decrease the dose to 50 mg 3 times a day.  You are still going to need additional blood pressure medication to help control your blood pressure.  Is important that you monitor your blood pressure each day and see how you feel so we know how to further adjust medications.  Keep your upcoming appointment with cardiology.  It would be important that you get better control of your blood sugar.  Your numbers should be closer to the 100s not in the 200s.

## 2022-11-26 NOTE — Progress Notes (Signed)
Established Patient Office Visit   Subjective  Patient ID: [name], male    DOB: Nov 19, 1950  Age: 72 y.o. MRN: [id number]  Chief Complaint  Patient presents with   Medical Management of Chronic Issues    Pt is following up on DM.     72 year old male with pmh sig for DM 2, malignant HTN, bilateral carotid artery stenosis, severe nonproliferative diabetic retinopathy of both eyes, insomnia, OA, history of prostate cancer seen for follow-up.  He was unable to get dental work done in December 2/2 HTN and form completion.  Was advised needs to see oral surgeon dental work completed.  Patient states blood pressure up-and-down at home.  Per memory systolic 562, 130,865H.  This morning BP 160/80 at home.  Patient denies headaches.  Feels like hydralazine 100 mg 3 times daily causes dizziness and decreased energy.  Dizziness may last 2-3 hours and fatigue can last all day.  Patient notices the dizziness when he is walking around home-improvement store.  Patient has done his "own experiment" stopping the medication and notes symptoms improved when he was not taking it.  Blood sugar 206 last night and 186 this morning.  Taking Novolin 70/30 twice daily.  States eating regular food.  Does not have glucometer with him or readings.      ROS Negative unless stated above    Objective:     BP (!) 180/100 (BP Location: Left Arm, Cuff Size: Large)   Pulse 75   Temp 98 F (36.7 C) (Oral)   Ht 5' 6.5" (1.689 m)   Wt 160 lb 12.8 oz (72.9 kg)   SpO2 97%   BMI 25.56 kg/m    Physical Exam Constitutional:      Appearance: Normal appearance.  HENT:     Head: Normocephalic and atraumatic.     Nose: Nose normal.     Mouth/Throat:     Mouth: Mucous membranes are moist.  Eyes:     Extraocular Movements: Extraocular movements intact.     Conjunctiva/sclera: Conjunctivae normal.     Pupils: Pupils are equal, round, and reactive to light.  Cardiovascular:     Rate and Rhythm: Normal  rate.     Heart sounds: Normal heart sounds.  Pulmonary:     Effort: Pulmonary effort is normal.     Breath sounds: Normal breath sounds.  Skin:    General: Skin is warm and dry.  Neurological:     Mental Status: He is alert and oriented to person, place, and time.      Results for orders placed or performed in visit on 11/26/22  POC HgB A1c  Result Value Ref Range   Hemoglobin A1C 7.9 (A) 4.0 - 5.6 %   HbA1c POC (<> result, manual entry)     HbA1c, POC (prediabetic range)     HbA1c, POC (controlled diabetic range)        Assessment & Plan:  Essential hypertension -Uncontrolled.  Component of white coat hypertension contributing. -Renal artery ultrasound without stenosis on 04/2021 -Carotid artery ultrasound on 08/14/2022 with bilateral 1-39% stenosis -Normal renin aldosterone, catecholamines, metanephrines, and cortisol -Pt concern hydralazine 100 mg 3 times daily causing dizziness and fatigue.  Discussed decreasing dose to 50 mg 3 times daily to see if symptoms resolve.  Also advised anemia can contribute to dizziness. -Discussed decreasing medication list to aid with compliance.  Switch Coreg from 25 mg twice daily to Coreg CR 80 mg daily which is equivalent. -Continue other medications including  Norvasc 10 mg daily, Cardura 8 mg nightly -Continue follow-up with cardiology -     Basic metabolic panel -     TSH -     Carvedilol Phosphate ER; Take 1 capsule (80 mg total) by mouth daily.  Dispense: 30 capsule; Refill: 1 -     hydrALAZINE HCl; Take 1 tablet (50 mg total) by mouth 3 (three) times daily.  Dispense: 90 tablet; Refill: 1  Type 2 diabetes mellitus with diabetic polyneuropathy, without long-term current use of insulin (HCC) -Hemoglobin A1c 7.9% on 11/26/2022.  In the past fructosamine level obtained concerns for inconsistency. -The importance of lifestyle modifications -Continue Farxiga 5 mg daily, Novolin 70/30 20 units in a.m. and 24 units in p.m. -Continue  statin -     POCT glycosylated hemoglobin (Hb A1C) -     Hemoglobin A1c  Iron deficiency anemia, unspecified iron deficiency anemia type -     Ferrous Gluconate; Take 1 tablet (324 mg total) by mouth daily with breakfast.  Dispense: 90 tablet; Refill: 1 -     CBC with Differential/Platelet -     Iron, TIBC and Ferritin Panel  Dizziness -Discussed various causes including anemia, medication, dehydration, vertigo, etc. -Will decrease dose of hydralazine from 100 mg 3 times daily to 50 mg 3 times daily as pt concerned medication is the cause. -Obtain labs to evaluate for anemia given history. -     Ferrous Gluconate; Take 1 tablet (324 mg total) by mouth daily with breakfast.  Dispense: 90 tablet; Refill: 1 -     CBC with Differential/Platelet -     Iron, TIBC and Ferritin Panel -     Basic metabolic panel -     TSH  Fatigue, unspecified type -     Basic metabolic panel -     TSH    Return in about 6 weeks (around 01/07/2023).   [name], MD

## 2022-11-27 LAB — IRON,TIBC AND FERRITIN PANEL
%SAT: 19 % (calc) — ABNORMAL LOW (ref 20–48)
Ferritin: 63 ng/mL (ref 24–380)
Iron: 52 ug/dL (ref 50–180)
TIBC: 276 mcg/dL (calc) (ref 250–425)

## 2022-12-02 NOTE — Progress Notes (Unsigned)
Advanced Hypertension Clinic Initial Assessment:    Date:  12/04/2022   ID:  [name], DOB Mar 31, 1951, MRN [id number]  PCP:  [name], MD  Cardiologist:  None  Nephrologist:  Referring MD: [name], MD   CC: Hypertension  History of Present Illness:    [name] is a 72 y.o. male with a hx of DM2, HLD, HTN, CKD here to follow up in the Advanced Hypertension Clinic.   [name] was diagnosed with hypertension 4-5 years ago. It has been difficult to control. Prior renal duplex 04/2021 with no stenosis. Blood pressure checked with arm cuff at home with readings have been 180s. Home BP cuff has been checked for accuracy.  No alcohol nor tobacco use.. he eats at home and outside of the home. 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 He used to work as a Training and development officer for work and though reports following low salt diet often eats out, eats premade meals, or seasons food with salt. Follows with nephrology and is concerned regarding his renal function and blood pressure. Previous notes from primary care state he often does not give medications much of a chance prior to stopping or requesting change.   Established with advanced hypertension clinic 07/03/22. He was enrolled in remote patient monitoring. Metoprolol increased and later transitioned to Coreg '25mg'$  BID. Hydralazine increased to '100mg'$  TID. Diltiazem later transitioned to Amlodipine. Carotid duplex 08/14/22 bilateral 1-39% stenosis. Echo 08/14/22 normal LVEF 60-65%, mild AI with aortic valve sclerosis but no stenosis, gr1DD, RV normal, LA mildly dilated, mild MR. Labs for secondary hypertension (renin aldosterone, catecholamines, metanephrines, cortisol) were normal. Doxazosin later added and dose increased.   Last saw Dr. Oval Linsey 11/03/22 with BP better controlled on home remote patient monitoring and elevated in clinic. Doxazosin increased to '8mg'$  QD.   He  saw primary care 11/26/22 and was concerned his dizziness was caused by Hydralazine '100mg'$  TID - he was transitioned to '50mg'$  TID. Also noted that anemia may be contributory. Carvedilol '25mg'$  BID transitioned to Coreg CR '80mg'$  QD (equivalent dose) to help consolidate medications.   He presents today for follow up independently. Has not yet picked up Coreg CR but has been taking his Carvedilol '25mg'$  BID. Notes since reduced dose of Hydralazine he still feels lightheaded. No near syncope, syncope. He is taking Amlodipine, Hydralazine, Doxazosin, Carvedilol all in the morning which may be contributory to sudden drop in BP. Taking Hydralazine BID instead of TID as prescribed. Morning medicine around 9A-10A. Afternoon 5PM-6PM. Still with stressors with water bill which is followed by our SW team.   BP at home via remote patient monitoring: 2/1 169/91 2/2 104/64, 171/87 2/3 3132/68, 131/81 2/4 172/88 2/5 172/84 2/6 102/51 2/7 135/69, 124/72  Previous antihypertensives: Triamterene-HCTZ - discontinued due to renal function Diltiazem - switched to Amlodipine Metoprolol - switched to carvedilol Carvedilol - switched to Carvedilol CR  Past Medical History:  Diagnosis Date   CKD (chronic kidney disease)    Diabetes mellitus without complication ([place])    Hyperlipidemia    Hypertension    Insomnia    Prostate cancer ([place])     Past Surgical History:  Procedure Laterality Date   PROSTATE BIOPSY      Current Medications: Current Meds  Medication Sig   amLODipine (NORVASC) 10 MG tablet Take 1 tablet (10 mg total) by mouth daily.   atorvastatin (LIPITOR) 40 MG tablet TAKE 1 TABLET BY MOUTH  EVERY DAY   blood glucose meter kit and supplies Dispense based on patient and insurance preference. Use to check glucose daily (FOR ICD-10 E10.9, E11.9).   carvedilol (COREG CR) 80 MG 24 hr capsule Take 1 capsule (80 mg total) by mouth daily.   Cholecalciferol (VITAMIN D3) 50 MCG (2000 UT) TABS Take 1 tablet by  mouth daily.   Continuous Blood Gluc Receiver (FREESTYLE LIBRE 2 READER) DEVI 1 Device by Does not apply route as directed.   Continuous Blood Gluc Sensor (FREESTYLE LIBRE 3 SENSOR) MISC 1 Device by Does not apply route every 14 (fourteen) days. Place 1 sensor on the skin every 14 days. Use to check glucose continuously   dapagliflozin propanediol (FARXIGA) 5 MG TABS tablet Take 1 tablet (5 mg total) by mouth daily before breakfast.   doxazosin (CARDURA) 8 MG tablet Take 1 tablet (8 mg total) by mouth at bedtime.   ferrous gluconate (FERGON) 324 MG tablet Take 1 tablet (324 mg total) by mouth daily with breakfast.   Insulin Pen Needle (BD PEN NEEDLE NANO 2ND GEN) 32G X 4 MM MISC To use with insulin pen twice a day.   Lancet Devices (ONE TOUCH DELICA LANCING DEV) MISC 1 Device by Does not apply route as directed.   latanoprost (XALATAN) 0.005 % ophthalmic solution SMARTSIG:In Eye(s)   NOVOLIN 70/30 KWIKPEN (70-30) 100 UNIT/ML KwikPen INJECT 20 UNITS INTO THE SKIN DAILY BEFORE BREAKFAST AND 24 UNITS AT BEDTIME.   OneTouch Delica Lancets 11B MISC 1 Device by Does not apply route as directed. Use to test up to 4x daily.   ONETOUCH ULTRA test strip USE UP TO 4X DAILY   ramelteon (ROZEREM) 8 MG tablet Take 1 tablet (8 mg) 30 minutes before bedtime.   [DISCONTINUED] hydrALAZINE (APRESOLINE) 50 MG tablet Take 1 tablet (50 mg total) by mouth 3 (three) times daily.     Allergies:   Patient has no known allergies.   Social History   Socioeconomic History   Marital status: Single    Spouse name: Not on file   Number of children: 2   Years of education: Not on file   Highest education level: Not on file  Occupational History    Comment: retired  Tobacco Use   Smoking status: Never   Smokeless tobacco: Never  Vaping Use   Vaping Use: Never used  Substance and Sexual Activity   Alcohol use: No   Drug use: No   Sexual activity: Yes  Other Topics Concern   Not on file  Social History Narrative    Not on file   Social Determinants of Health   Financial Resource Strain: Low Risk  (09/04/2022)   Overall Financial Resource Strain (CARDIA)    Difficulty of Paying Living Expenses: Not very hard  Food Insecurity: No Food Insecurity (09/04/2022)   Hunger Vital Sign    Worried About Running Out of Food in the Last Year: Never true    Ran Out of Food in the Last Year: Never true  Transportation Needs: No Transportation Needs (09/04/2022)   PRAPARE - Hydrologist (Medical): No    Lack of Transportation (Non-Medical): No  Physical Activity: Inactive (05/05/2022)   Exercise Vital Sign    Days of Exercise per Week: 0 days    Minutes of Exercise per Session: 0 min  Stress: No Stress Concern Present (05/05/2022)   [place]    Feeling of Stress :  Not at all  Social Connections: Socially Isolated (04/17/2021)   Social Connection and Isolation Panel [NHANES]    Frequency of Communication with Friends and Family: Twice a week    Frequency of Social Gatherings with Friends and Family: Twice a week    Attends Religious Services: Never    Marine scientist or Organizations: No    Attends Music therapist: Never    Marital Status: Divorced    Family History: The patient's family history includes Colon cancer in his mother; Diabetes in his brother; Hypertension in his daughter and mother. There is no history of Breast cancer, Pancreatic cancer, or Prostate cancer.  ROS:   Please see the history of present illness.     All other systems reviewed and are negative.  EKGs/Labs/Other Studies Reviewed:    EKG:  EKG is not  ordered today.    Echo 08/14/22 The aortic valve is tricuspid. There is moderate calcification of the  aortic valve. There is mild thickening of the aortic valve. Aortic valve  regurgitation is mild. Aortic valve sclerosis/calcification is present,  without any  evidence of aortic  stenosis. Aortic valve mean gradient measures 9.0 mmHg. Aortic valve Vmax  measures 2.00 m/s.   2. Left ventricular ejection fraction, by estimation, is 60 to 65%. The  left ventricle has normal function. The left ventricle has no regional  wall motion abnormalities. Left ventricular diastolic parameters are  consistent with Grade I diastolic  dysfunction (impaired relaxation). The average left ventricular global  longitudinal strain is -17.1 %. The global longitudinal strain is normal.   3. Right ventricular systolic function is normal. The right ventricular  size is normal.   4. Left atrial size was mildly dilated.   5. The mitral valve is normal in structure. Mild mitral valve  regurgitation. No evidence of mitral stenosis. Moderate mitral annular  calcification.   6. The inferior vena cava is normal in size with greater than 50%  respiratory variability, suggesting right atrial pressure of 3 mmHg.   Carotid duplex 08/14/22 Right Carotid: Velocities in the right ICA are consistent with a 1-39%  stenosis.   Left Carotid: Velocities in the left ICA are consistent with a 1-39%  stenosis.   Vertebrals: Bilateral vertebral arteries demonstrate antegrade flow.  Subclavians: Normal flow hemodynamics were seen in bilateral subclavian               arteries. Recent Labs: 09/04/2022: ALT 16 11/26/2022: BUN 29; Creatinine, Ser 2.03; Hemoglobin 11.6; Platelets 240.0; Potassium 4.0; Sodium 140; TSH 1.77   Recent Lipid Panel    Component Value Date/Time   CHOL 179 04/10/2022 1014   TRIG 68.0 04/10/2022 1014   HDL 53.70 04/10/2022 1014   CHOLHDL 3 04/10/2022 1014   VLDL 13.6 04/10/2022 1014   LDLCALC 111 (H) 04/10/2022 1014   LDLCALC 70 09/07/2020 1038   LDLDIRECT 98 09/04/2022 1152    Physical Exam:   VS:  BP (!) 206/102 (BP Location: Left Arm, Patient Position: Sitting, Cuff Size: Normal)   Pulse 75   Ht 5' 6.5" (1.689 m)   Wt 162 lb 8 oz (73.7 kg)   SpO2 97%    BMI 25.84 kg/m  , BMI Body mass index is 25.84 kg/m. GENERAL:  Well appearing HEENT: Pupils equal round and reactive, fundi not visualized, oral mucosa unremarkable NECK:  No jugular venous distention, waveform within normal limits, carotid upstroke brisk and symmetric, no bruits, no thyromegaly LYMPHATICS:  No cervical adenopathy LUNGS:  Clear to auscultation bilaterally HEART:  RRR.  PMI not displaced or sustained,S1 and S2 within normal limits, no S3, no S4, no clicks, no rubs, Gr 3/6 murmurs ABD:  Flat, positive bowel sounds normal in frequency in pitch, no bruits, no rebound, no guarding, no midline pulsatile mass, no hepatomegaly, no splenomegaly EXT:  2 plus pulses throughout, no edema, no cyanosis no clubbing SKIN:  No rashes no nodules NEURO:  Cranial nerves II through XII grossly intact, motor grossly intact throughout PSYCH:  Cognitively intact, oriented to person place and time   ASSESSMENT/PLAN:    HTN -  Diagnosed with hypertension 4-5 years ago. Management complicated by renal function. 04/2021 renal artery duplex no stenosis. 03/2022 normal thyroid function. Labs including renin-aldosterone, catecholamines/metanephrines, TSH were normal BP monitored at home labile 102/51-172/84. Average over the last week 140/68. Notes lightheadedness with taking all antihypertensives in the morning.  Lightheadedness not much improved by reducing hydralazine from 100 mg to 50 mg.  Will have him move his carvedilol CR, Doxazosin to the evening. Change Hydralazine to BID as he is only taking BID.  Discussed renal denervation today which he politely declines.  Snores / sleep disordered breathing - Notes some snoring though wakes feeling rested. Longstanding history of insomnia. Previously politely declined sleep study.   CKD - Careful titration of diuretic and antihypertensive. 11/26/22 creatinine 2.03, GFR 32.  Established with nephrology  Anemia - 08/26/22 Hb 10.6. 11/26/22 Hb 11.6. On  OTC  iron per PCP and nephrology.   DM2 - Continue to follow with PCP. Appreciate inclusion of SGLT2i for cardioprotective benefit.   Cardiac murmur - noted on exam. Echo 08/14/22 LVEF 60-65%, moderate calcification of aortic valve with mild AI and no stenosis, gr1DD, LA mildly dilated, mild MR, moderate mitral annular calcification. Consider repeat echo in 1 year for monitoring. Murmur likely due to aortic calcification.   HLD - Continue Atorvastatin per PCP.   Carotid artery stenosis - Carotid duplex 07/2020 with moderate left sided elevated plaque. Minimal amount of right sided plaque. 07/2022 carotid duplex bilateral 1-39% stenosis. Consider repeat duplex one year.   Screening for Secondary Hypertension:     07/07/2022    8:10 PM  Causes  Drugs/Herbals Screened  Renovascular HTN Screened  Thyroid Disease Screened  Hyperaldosteronism Screened  Pheochromocytoma Screened  Cushing's Syndrome Screened  Coarctation of the Aorta Screened    Relevant Labs/Studies:    Latest Ref Rng & Units 11/26/2022   11:26 AM 05/29/2022    9:26 AM 05/16/2022    7:41 AM  Basic Labs  Sodium 135 - 145 mEq/L 140  141  142   Potassium 3.5 - 5.1 mEq/L 4.0  4.7  4.5   Creatinine 0.40 - 1.50 mg/dL 2.03  1.89  2.16        Latest Ref Rng & Units 11/26/2022   11:26 AM 04/10/2022   10:14 AM  Thyroid   TSH 0.35 - 5.50 uIU/mL 1.77  1.35        Latest Ref Rng & Units 09/04/2022   11:52 AM  Renin/Aldosterone   Aldosterone 0.0 - 30.0 ng/dL <1.0   Aldos/Renin Ratio 0.0 - 30.0 <2.8        Latest Ref Rng & Units 09/04/2022   11:52 AM  Metanephrines/Catecholamines   Epinephrine 0 - 62 pg/mL 25   Norepinephrine 0 - 874 pg/mL 282   Dopamine 0 - 48 pg/mL <30   Metanephrines 0.0 - 88.0 pg/mL <25.0   Normetanephrines  0.0 -  285.2 pg/mL 31.9            Disposition:    FU with Dr. Oval Linsey in 1-2 months  Medication Adjustments/Labs and Tests Ordered: Current medicines are reviewed at length with the patient  today.  Concerns regarding medicines are outlined above.  Orders Placed This Encounter  Procedures   Cantril's Ladder Assessment   Meds ordered this encounter  Medications   hydrALAZINE (APRESOLINE) 50 MG tablet    Sig: Take 1 tablet (50 mg total) by mouth in the morning and at bedtime.    Dispense:  180 tablet    Refill:  3     Signed, Loel Dubonnet, NP  07/03/2022 10:11 AM    [place]

## 2022-12-04 ENCOUNTER — Encounter (HOSPITAL_BASED_OUTPATIENT_CLINIC_OR_DEPARTMENT_OTHER): Payer: Self-pay | Admitting: Family

## 2022-12-04 ENCOUNTER — Ambulatory Visit (INDEPENDENT_AMBULATORY_CARE_PROVIDER_SITE_OTHER): Payer: Medicare HMO | Admitting: Family

## 2022-12-04 ENCOUNTER — Telehealth: Payer: Self-pay | Admitting: Licensed Clinical Social Worker

## 2022-12-04 VITALS — BP 206/102 | HR 75 | Ht 66.5 in | Wt 162.5 lb

## 2022-12-04 DIAGNOSIS — E1165 Type 2 diabetes mellitus with hyperglycemia: Secondary | ICD-10-CM

## 2022-12-04 DIAGNOSIS — I10 Essential (primary) hypertension: Secondary | ICD-10-CM | POA: Diagnosis not present

## 2022-12-04 DIAGNOSIS — Z794 Long term (current) use of insulin: Secondary | ICD-10-CM | POA: Diagnosis not present

## 2022-12-04 DIAGNOSIS — I6523 Occlusion and stenosis of bilateral carotid arteries: Secondary | ICD-10-CM

## 2022-12-04 DIAGNOSIS — Z006 Encounter for examination for normal comparison and control in clinical research program: Secondary | ICD-10-CM

## 2022-12-04 MED ORDER — HYDRALAZINE HCL 50 MG PO TABS: 50.0000 mg | ORAL_TABLET | Freq: Two times a day (BID) | ORAL | 3 refills | Status: DC

## 2022-12-04 NOTE — Telephone Encounter (Signed)
H&V Care Navigation CSW Progress Note  Clinical Social Worker contacted patient by phone to f/u on paperwork left regarding bills and request for assistance. LCSW noted that I received only one page of water bill, notice of plumbing work done, and then a [employer] previously been discussed which is actually a disconnection notice. LCSW was able to reach pt at home at [phone]. Inquired about various bills, discussed financial status of income and bills. Spoke with my team lead Kennyth Lose and we will be able to assist to ensure the energy is not disconnected but at this time ongoing Patient [place] assistance will be limited. Pt to bring information regarding income by [facility] and needs to sign letter for Patient Care Fund agreement. I will connect with Zigmund Daniel, patient access about this.    Patient is participating in a Managed Medicaid Plan:    [place]: No Food Insecurity (09/04/2022)  Housing: Low Risk  (07/03/2022)  Transportation Needs: No Transportation Needs (09/04/2022)  Utilities: At Risk (09/04/2022)  Alcohol Screen: Low Risk  (07/03/2022)  Depression (PHQ2-9): Low Risk  (11/26/2022)  Financial Resource Strain: Low Risk  (09/04/2022)  Physical Activity: Inactive (05/05/2022)  Social Connections: Socially Isolated (04/17/2021)  Stress: No Stress Concern Present (05/05/2022)  Tobacco Use: Low Risk  (12/04/2022)   Westley Hummer, MSW, LCSW Clinical Social Worker II [place]  [phone]- work cell phone (preferred) [phone]- desk phone

## 2022-12-04 NOTE — Research (Addendum)
I saw pt today after Laurann Montana NP follow up visit. Pt is in Dr. Blenda Mounts Virtual Care HTN Study. Pt filled out research survey. Pt is enrolled in Group 2.

## 2022-12-04 NOTE — Patient Instructions (Addendum)
Medication Instructions:  Your physician has recommended you make the following change in your medication:   CHANGE Hydralazine to '25mg'$  twice daily  Follow this medication schedule: Morning Hydralazine Amlodipine  Evening  Hydralazine Carvedilol CR (when you pick up the extended release) Doxazosin   Labwork/Testing/Procedures: None ordered today.   Follow-Up: In 1-2 months with Dr. Oval Linsey or Loel Dubonnet, NP in Hypertension Clinic   Special Instructions:  Please take your medications 12 hours apart

## 2022-12-05 ENCOUNTER — Encounter (HOSPITAL_BASED_OUTPATIENT_CLINIC_OR_DEPARTMENT_OTHER): Payer: Self-pay

## 2022-12-08 ENCOUNTER — Telehealth: Payer: Self-pay | Admitting: Licensed Clinical Social Worker

## 2022-12-08 NOTE — Telephone Encounter (Signed)
H&V Care Navigation CSW Progress Note  Clinical Social Worker contacted patient by phone to f/u on needed Patient Care Fund Agreement. Need this in order to further assist with payment of [employer] bill. LCSW was able to reach pt this morning, updated him that I have needed documents except that paper and he is in agreement with going back to [facility] to sign that, patient access advocate Zigmund Daniel at check in has paperwork.   Patient is participating in a Managed Medicaid Plan:  [place] Medicare only.   SDOH Screenings   Food Insecurity: No Food Insecurity (09/04/2022)  Housing: Low Risk  (07/03/2022)  Transportation Needs: No Transportation Needs (09/04/2022)  Utilities: At Risk (09/04/2022)  Alcohol Screen: Low Risk  (07/03/2022)  Depression (PHQ2-9): Low Risk  (11/26/2022)  Financial Resource Strain: Low Risk  (09/04/2022)  Physical Activity: Inactive (05/05/2022)  Social Connections: Socially Isolated (04/17/2021)  Stress: No Stress Concern Present (05/05/2022)  Tobacco Use: Low Risk  (12/04/2022)   Westley Hummer, MSW, LCSW Clinical Social Worker II [place]  [phone]- work cell phone (preferred) [phone]- desk phone

## 2022-12-08 NOTE — Telephone Encounter (Signed)
H&V Care Navigation CSW Progress Note  Pt able to come and sign [facility] [place] Agreement and all documentation submitted to [facility] [place]. We were able to prevent disconnection- pt aware there is still a balance due. We discussed pt applying for Medicaid as well as calling [employer] to ensure if not eligible for Medicaid that he is assessed for Extra Help and Medicare Savings Program. I will mail all this information to home address. No additional questions at this time.   Patient is participating in a Managed Medicaid Plan:  No, Aetna Medicare only, sent pt information about Medicaid/[employer]   SDOH Screenings   Food Insecurity: No Food Insecurity (09/04/2022)  Housing: Low Risk  (07/03/2022)  Transportation Needs: No Transportation Needs (09/04/2022)  Utilities: At Risk (09/04/2022)  Alcohol Screen: Low Risk  (07/03/2022)  Depression (PHQ2-9): Low Risk  (11/26/2022)  Financial Resource Strain: Low Risk  (09/04/2022)  Physical Activity: Inactive (05/05/2022)  Social Connections: Socially Isolated (04/17/2021)  Stress: No Stress Concern Present (05/05/2022)  Tobacco Use: Low Risk  (12/04/2022)    Westley Hummer, MSW, LCSW Clinical Social Worker II [place]  [phone]- work cell phone (preferred) [phone]- desk phone

## 2022-12-08 NOTE — Telephone Encounter (Signed)
H&V Care Navigation CSW Progress Note  Clinical Social Worker contacted patient by phone to f/u on agreement. Pt still hasn't come- I let him know that front desk is at lunch until 1pm. Impressed upon him that he has to come before end of day or we cannot assist.  He will come by and sign agreement.   Patient is participating in a Managed Medicaid Plan:  No Aetna Medicare  [place]: No Food Insecurity (09/04/2022)  Housing: Low Risk  (07/03/2022)  Transportation Needs: No Transportation Needs (09/04/2022)  Utilities: At Risk (09/04/2022)  Alcohol Screen: Low Risk  (07/03/2022)  Depression (PHQ2-9): Low Risk  (11/26/2022)  Financial Resource Strain: Low Risk  (09/04/2022)  Physical Activity: Inactive (05/05/2022)  Social Connections: Socially Isolated (04/17/2021)  Stress: No Stress Concern Present (05/05/2022)  Tobacco Use: Low Risk  (12/04/2022)    Westley Hummer, MSW, LCSW Clinical Social Worker II [place]  [phone]- work cell phone (preferred) [phone]- desk phone

## 2022-12-09 NOTE — Telephone Encounter (Signed)
H&V Care Navigation CSW Progress Note  Clinical Social Worker  mailed pt his signed [facility] [place] Agreement  to keep in his records. Remain available as needed.   Patient is participating in a Managed Medicaid Plan:  No, Aetna Medicare only  [place]: No Food Insecurity (09/04/2022)  Housing: Low Risk  (07/03/2022)  Transportation Needs: No Transportation Needs (09/04/2022)  Utilities: At Risk (09/04/2022)  Alcohol Screen: Low Risk  (07/03/2022)  Depression (PHQ2-9): Low Risk  (11/26/2022)  Financial Resource Strain: Low Risk  (09/04/2022)  Physical Activity: Inactive (05/05/2022)  Social Connections: Socially Isolated (04/17/2021)  Stress: No Stress Concern Present (05/05/2022)  Tobacco Use: Low Risk  (12/04/2022)    Westley Hummer, MSW, LCSW Clinical Social Worker II [place]  [phone]- work cell phone (preferred) [phone]- desk phone

## 2022-12-10 ENCOUNTER — Other Ambulatory Visit: Payer: Self-pay | Admitting: Family Medicine

## 2022-12-10 DIAGNOSIS — I10 Essential (primary) hypertension: Secondary | ICD-10-CM

## 2022-12-11 ENCOUNTER — Telehealth: Payer: Self-pay

## 2022-12-11 DIAGNOSIS — Z0000 Encounter for general adult medical examination without abnormal findings: Secondary | ICD-10-CM

## 2022-12-11 NOTE — Telephone Encounter (Signed)
Called patient due to being in the [facility] program more than 120 days to inform him that he can return his device to Laurann Montana, NP at [facility]. Left patient a message and provided him with my contact information in case he has any questions.   [name] Leeds, MS, ERHD, [facility]  Care Guide, Health & Wellness Coach [address] [place] [address] Telephone: [phone] Email: [email or web address]

## 2022-12-15 ENCOUNTER — Other Ambulatory Visit: Payer: Self-pay | Admitting: Family Medicine

## 2022-12-15 DIAGNOSIS — E1169 Type 2 diabetes mellitus with other specified complication: Secondary | ICD-10-CM

## 2022-12-17 ENCOUNTER — Telehealth: Payer: Self-pay

## 2022-12-17 DIAGNOSIS — Z0000 Encounter for general adult medical examination without abnormal findings: Secondary | ICD-10-CM

## 2022-12-17 NOTE — Telephone Encounter (Signed)
Spoke with patient and informed him that he can return his Vivify cuff to the office since he has been in the program over 120 days per Dr. Oval Linsey. Patient stated that he will return the device today. Informed Kay at [facility]'s front desk that patient will be dropping off device.  [name] Leeds, MS, ERHD, [facility]  Care Guide, Health & Wellness Coach [address], [address] [place] [address] Telephone: [phone] Email: [email or web address]

## 2022-12-22 DIAGNOSIS — B338 Other specified viral diseases: Secondary | ICD-10-CM | POA: Diagnosis not present

## 2022-12-31 ENCOUNTER — Telehealth: Payer: Self-pay | Admitting: *Deleted

## 2022-12-31 DIAGNOSIS — I10 Essential (primary) hypertension: Secondary | ICD-10-CM

## 2022-12-31 DIAGNOSIS — E1142 Type 2 diabetes mellitus with diabetic polyneuropathy: Secondary | ICD-10-CM

## 2022-12-31 NOTE — Telephone Encounter (Signed)
Pharmacy referral placed as requested.

## 2023-01-07 ENCOUNTER — Ambulatory Visit (INDEPENDENT_AMBULATORY_CARE_PROVIDER_SITE_OTHER): Payer: Medicare HMO | Admitting: Family Medicine

## 2023-01-07 ENCOUNTER — Telehealth: Payer: Self-pay

## 2023-01-07 ENCOUNTER — Encounter: Payer: Self-pay | Admitting: Family Medicine

## 2023-01-07 VITALS — BP 233/117 | HR 81 | Temp 97.6°F | Wt 155.2 lb

## 2023-01-07 DIAGNOSIS — Z794 Long term (current) use of insulin: Secondary | ICD-10-CM

## 2023-01-07 DIAGNOSIS — E1142 Type 2 diabetes mellitus with diabetic polyneuropathy: Secondary | ICD-10-CM

## 2023-01-07 DIAGNOSIS — I10 Essential (primary) hypertension: Secondary | ICD-10-CM

## 2023-01-07 NOTE — Progress Notes (Signed)
Care Management & Coordination Services Pharmacy Team  Reason for Encounter: Appointment Reminder  Contacted patient to confirm in office appointment with Burman Riis, PharmD on 01/14/2023 at 9:00. Spoke with patient on 01/13/2023   Have you seen any other providers since your last visit? **Patient denies any visits not noted in Epic  Any changes in your medications or health? Patient denies  Any side effects from any medications? Patient denies  Do you have an symptoms or problems not managed by your medications? Patient denies  Any concerns about your health right now? Patient denies  Has your provider asked that you check blood pressure, blood sugar, or follow special diet at home? Yes, patient checks blood pressures and blood sugars at home, advised to bring his logs with him to his visit.  Can you think of a goal you would like to reach for your health? Patient denies  Do you have any problems getting your medications? Patient denies  Is there anything that you would like to discuss during the appointment? Patient denies  Please bring medications and supplements to appointment   Chart review:  Recent office visits:  11/26/2022 Grier Mitts MD - Patient was seen for essential hypertension and additional concerns. Increased Carvedilol to 80 mg daily. Decreased Hydralazine to 50 mg three times daily.   08/18/2022 Grier Mitts MD - Patient was seen for Malignant hypertension and additional concerns. Started Ramelteon 8 mg 1 tablet 30 mg before bedtime.   07/17/2022 Grier Mitts MD - Patient was seen for other secondary hypertension and additional concerns. No medication changes.   05/05/2022 Glenna Durand LPN - Encounter for Medicare annual wellness exam   Recent consult visits:  12/04/2022 Laurann Montana NP (cardiology) - Patient was seen for essential hypertension and additional concerns. Decreased Hydralazine 50 mg to twice daily.   11/03/2022 Skeet Latch MD  (cardiology) - Patient was seen for murmur and additional concerns. Increased Doxazosin to 8 mg at bedtime.  09/04/2022 Laurann Montana NP (cardiology) - Patient was seen for essential hypertension and additional concern. Started Doxazosin 2 mg at bedtime. Increased Amlodipine to 10 mg daily.   08/26/2022 Lawson Radar (Internal med) - Patient was seen for Type 2 diabetes mellitus with diabetic chronic kidney disease and additional concerns. No additional chart notes.  08/14/2022 Carlyle Dolly (cardiology) - Patient was seen for occlusion and stenosis of bilateral carotid arteries. No additional chart notes.   08/14/2022 Candee Furbish MD (cardiology) - Patient was seen for cardiac murmur and essential hypertension. No additional chart notes.   Hospital visits:  None  Care Gaps: AWV - completed 05/05/2022 Last BP - 206/102 on 12/04/2022 Last A1C  - 7.9 on 11/26/2022 Last eye exam - 01/11/2020 Last foot exam - 03/22/2020 Tdap - never done Shingrix - never done Colonoscopy - never done Pneumovax - overdue Covid - overdue Flu - postponed  Star Rating Drugs: Atorvastatin 40 mg - last filled 12/15/2022 90 DS at [facility] Pharmacist Assistant [phone]

## 2023-01-07 NOTE — Progress Notes (Signed)
   Established Patient Office Visit   Subjective  Patient ID: [name], male    DOB: September 30, 1951  Age: 72 y.o. MRN: [id number]  Chief Complaint  Patient presents with   Hypertension    Patient is a 72 year old male with pmh sig for resistant hypertension, prostate cancer, DM2, CKD 3, ED, retinopathy, cataract occlusion/stenosis of bilateral carotid arteries who is seen for follow-up.  Patient states he is still having difficulty sleeping.  Lorazepam did not help.  Taking Tylenol PM half tab.  Patient no longer having dizziness since stopping one of his blood pressure medications.  Taking hydralazine 3 times daily, Coreg, Norvasc, Cardura.  States it was a "pink pill".  Patient states he is taking all of his medications however only brings a few bottles to clinic.  Patient endorses frustration with having to get different medications than the dose constantly changing.  States BP at home 120s, 140s/70s to 90s.  Blood sugar at home good per patient, 180.  Patient cooking most meals.      ROS Negative unless stated above    Objective:     BP (!) 233/117   Pulse 81   Temp 97.6 F (36.4 C) (Oral)   Wt 155 lb 3.2 oz (70.4 kg)   SpO2 99%   BMI 24.67 kg/m    Physical Exam Constitutional:      General: He is not in acute distress.    Appearance: Normal appearance.  HENT:     Head: Normocephalic and atraumatic.     Nose: Nose normal.     Mouth/Throat:     Mouth: Mucous membranes are moist.  Cardiovascular:     Rate and Rhythm: Normal rate and regular rhythm.     Heart sounds: Normal heart sounds. No murmur heard.    No gallop.  Pulmonary:     Effort: Pulmonary effort is normal. No respiratory distress.     Breath sounds: Normal breath sounds. No wheezing, rhonchi or rales.  Skin:    General: Skin is warm and dry.  Neurological:     Mental Status: He is alert and oriented to person, place, and time.      No results found for any visits on 01/07/23.    Assessment &  Plan:  Malignant hypertension  Type 2 diabetes mellitus with diabetic polyneuropathy, with long-term current use of insulin (HCC)  Discussed importance of medication compliance.  Patient to continue Norvasc 10 mg daily, Coreg 80 mg daily, Cardura 8 mg nightly, hydralazine 50 mg 3 times daily.  Lifestyle modifications encouraged.  Hemoglobin A1c 7.9% on 11/26/2022.  Foot exam with podiatry and/or Endo.  Novolin 70/30 20 units in a.m. and 24 units at bedtime.  Discussed the importance of eating regular meals.  Close follow-up encouraged with cardiology and endocrinology.  Return in about 7 weeks (around 02/25/2023) for blood pressure re-check.   Deeann Saint, MD

## 2023-01-08 ENCOUNTER — Encounter (HOSPITAL_BASED_OUTPATIENT_CLINIC_OR_DEPARTMENT_OTHER): Payer: Self-pay | Admitting: Family

## 2023-01-08 ENCOUNTER — Ambulatory Visit (HOSPITAL_BASED_OUTPATIENT_CLINIC_OR_DEPARTMENT_OTHER): Payer: Medicare HMO | Admitting: Family

## 2023-01-08 VITALS — BP 200/88 | HR 85 | Ht 66.5 in | Wt 153.0 lb

## 2023-01-08 DIAGNOSIS — E782 Mixed hyperlipidemia: Secondary | ICD-10-CM | POA: Diagnosis not present

## 2023-01-08 DIAGNOSIS — I6523 Occlusion and stenosis of bilateral carotid arteries: Secondary | ICD-10-CM | POA: Diagnosis not present

## 2023-01-08 DIAGNOSIS — R011 Cardiac murmur, unspecified: Secondary | ICD-10-CM | POA: Diagnosis not present

## 2023-01-08 DIAGNOSIS — Z794 Long term (current) use of insulin: Secondary | ICD-10-CM | POA: Diagnosis not present

## 2023-01-08 DIAGNOSIS — I10 Essential (primary) hypertension: Secondary | ICD-10-CM | POA: Diagnosis not present

## 2023-01-08 DIAGNOSIS — E1165 Type 2 diabetes mellitus with hyperglycemia: Secondary | ICD-10-CM | POA: Diagnosis not present

## 2023-01-08 DIAGNOSIS — N1832 Chronic kidney disease, stage 3b: Secondary | ICD-10-CM | POA: Diagnosis not present

## 2023-01-08 NOTE — Progress Notes (Signed)
Advanced Hypertension Clinic Initial Assessment:    Date:  01/08/2023   ID:  [name], DOB [birth year], MRN [id number]  PCP:  [name], MD  Cardiologist:  None  Nephrologist:  Referring MD: [name], MD   CC: Hypertension  History of Present Illness:    [name] is a 72 y.o. male with a hx of DM2, HLD, HTN, CKD here to follow up in the Advanced Hypertension Clinic.   [name] was diagnosed with hypertension 4-5 years ago. It has been difficult to control. Prior renal duplex 04/2021 with no stenosis. Blood pressure checked with arm cuff at home with readings have been 180s. Home BP cuff has been checked for accuracy.  No alcohol nor tobacco use.. he eats at home and outside of the home. 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 He used to work as a Training and development officer for work and though reports following low salt diet often eats out, eats premade meals, or seasons food with salt. Follows with nephrology and is concerned regarding his renal function and blood pressure. Previous notes from primary care state he often does not give medications much of a chance prior to stopping or requesting change.   Established with advanced hypertension clinic 07/03/22. He was enrolled in remote patient monitoring. Metoprolol increased and later transitioned to Coreg '25mg'$  BID. Hydralazine increased to '100mg'$  TID. Diltiazem later transitioned to Amlodipine. Carotid duplex 08/14/22 bilateral 1-39% stenosis. Echo 08/14/22 normal LVEF 60-65%, mild AI with aortic valve sclerosis but no stenosis, gr1DD, RV normal, LA mildly dilated, mild MR. Labs for secondary hypertension (renin aldosterone, catecholamines, metanephrines, cortisol) were normal. Doxazosin later added and dose increased.   Last saw Dr. Oval Linsey 11/03/22 with BP better controlled on home remote patient monitoring and elevated in clinic. Doxazosin increased to '8mg'$  QD.   He  saw primary care 11/26/22 and was concerned his dizziness was caused by Hydralazine '100mg'$  TID - he was transitioned to '50mg'$  TID. Also noted that anemia may be contributory. Carvedilol '25mg'$  BID transitioned to Coreg CR '80mg'$  QD (equivalent dose) to help consolidate medications.   At ADV HTN clinic visit 12/04/22 Hydralazine transitioned to BID as he was not able to remember afternoon dosing. He still noted some lightheadedness with taking majority of antihypertensive agents in the morning, Carvedilol CR and Doxazosin were moved to evening dosing.   He presents today for follow up. Saw his primary care provider yesterday with BP 233/117 but noted he checked BP at home later that day 131/106. Blood pressure at home has been 130s-140s, readings detailed below. Notes his blood pressure is much better when he gets a good night of sleep. No recurrent lightheadedness. Reports no shortness of breath nor dyspnea on exertion. Reports no chest pain, pressure, or tightness. No edema, orthopnea, PND. Reports no palpitations.    Home BP readings: 131/106, 128/69, 153/88, 149/76, 162/90, 114/71 Average of home BP readings: 139/83  Home auto BP cuff reading: 225/112 Manual reading: 200/88  Previous antihypertensives: Triamterene-HCTZ - discontinued due to renal function Diltiazem - switched to Amlodipine Metoprolol - switched to carvedilol Carvedilol - switched to Carvedilol CR  Past Medical History:  Diagnosis Date   CKD (chronic kidney disease)    Diabetes mellitus without complication ([place])    Hyperlipidemia    Hypertension    Insomnia    Prostate cancer [facility])     Past Surgical History:  Procedure Laterality Date   PROSTATE BIOPSY  Current Medications: Current Meds  Medication Sig   amLODipine (NORVASC) 10 MG tablet Take 1 tablet (10 mg total) by mouth daily.   atorvastatin (LIPITOR) 40 MG tablet TAKE 1 TABLET BY MOUTH EVERY DAY   blood glucose meter kit and supplies Dispense based on patient  and insurance preference. Use to check glucose daily (FOR ICD-10 E10.9, E11.9).   carvedilol (COREG CR) 80 MG 24 hr capsule Take 1 capsule (80 mg total) by mouth daily.   Cholecalciferol (VITAMIN D3) 50 MCG (2000 UT) TABS Take 1 tablet by mouth daily.   Continuous Blood Gluc Receiver (FREESTYLE LIBRE 2 READER) DEVI 1 Device by Does not apply route as directed.   Continuous Blood Gluc Sensor (FREESTYLE LIBRE 3 SENSOR) MISC 1 Device by Does not apply route every 14 (fourteen) days. Place 1 sensor on the skin every 14 days. Use to check glucose continuously   dapagliflozin propanediol (FARXIGA) 5 MG TABS tablet Take 1 tablet (5 mg total) by mouth daily before breakfast.   doxazosin (CARDURA) 8 MG tablet Take 1 tablet (8 mg total) by mouth at bedtime.   ferrous gluconate (FERGON) 324 MG tablet Take 1 tablet (324 mg total) by mouth daily with breakfast.   hydrALAZINE (APRESOLINE) 50 MG tablet Take 1 tablet (50 mg total) by mouth in the morning and at bedtime.   Insulin Pen Needle (BD PEN NEEDLE NANO 2ND GEN) 32G X 4 MM MISC To use with insulin pen twice a day.   Lancet Devices (ONE TOUCH DELICA LANCING DEV) MISC 1 Device by Does not apply route as directed.   latanoprost (XALATAN) 0.005 % ophthalmic solution SMARTSIG:In Eye(s)   NOVOLIN 70/30 KWIKPEN (70-30) 100 UNIT/ML KwikPen INJECT 20 UNITS INTO THE SKIN DAILY BEFORE BREAKFAST AND 24 UNITS AT BEDTIME.   OneTouch Delica Lancets [id number] MISC 1 Device by Does not apply route as directed. Use to test up to 4x daily.   ONETOUCH ULTRA test strip USE UP TO 4X DAILY   ramelteon (ROZEREM) 8 MG tablet Take 1 tablet (8 mg) 30 minutes before bedtime.     Allergies:   Patient has no known allergies.   Social History   Socioeconomic History   Marital status: Single    Spouse name: Not on file   Number of children: 2   Years of education: Not on file   Highest education level: Not on file  Occupational History    Comment: retired  Tobacco Use   Smoking  status: Never   Smokeless tobacco: Never  Vaping Use   Vaping Use: Never used  Substance and Sexual Activity   Alcohol use: No   Drug use: No   Sexual activity: Yes  Other Topics Concern   Not on file  Social History Narrative   Not on file   Social Determinants of Health   Financial Resource Strain: Low Risk  (09/04/2022)   Overall Financial Resource Strain (CARDIA)    Difficulty of Paying Living Expenses: Not very hard  Food Insecurity: No Food Insecurity (09/04/2022)   Hunger Vital Sign    Worried About Running Out of Food in the Last Year: Never true    Ran Out of Food in the Last Year: Never true  Transportation Needs: No Transportation Needs (09/04/2022)   PRAPARE - Hydrologist (Medical): No    Lack of Transportation (Non-Medical): No  Physical Activity: Inactive (05/05/2022)   Exercise Vital Sign    Days of Exercise per Week: 0  days    Minutes of Exercise per Session: 0 min  Stress: No Stress Concern Present (05/05/2022)   [place]    Feeling of Stress : Not at all  Social Connections: Socially Isolated (04/17/2021)   Social Connection and Isolation Panel [NHANES]    Frequency of Communication with Friends and Family: Twice a week    Frequency of Social Gatherings with Friends and Family: Twice a week    Attends Religious Services: Never    Marine scientist or Organizations: No    Attends Music therapist: Never    Marital Status: Divorced    Family History: The patient's family history includes Colon cancer in his mother; Diabetes in his brother; Hypertension in his daughter and mother. There is no history of Breast cancer, Pancreatic cancer, or Prostate cancer.  ROS:   Please see the history of present illness.     All other systems reviewed and are negative.  EKGs/Labs/Other Studies Reviewed:    EKG:  EKG is not  ordered today.    Echo  08/14/22 The aortic valve is tricuspid. There is moderate calcification of the  aortic valve. There is mild thickening of the aortic valve. Aortic valve  regurgitation is mild. Aortic valve sclerosis/calcification is present,  without any evidence of aortic  stenosis. Aortic valve mean gradient measures 9.0 mmHg. Aortic valve Vmax  measures 2.00 m/s.   2. Left ventricular ejection fraction, by estimation, is 60 to 65%. The  left ventricle has normal function. The left ventricle has no regional  wall motion abnormalities. Left ventricular diastolic parameters are  consistent with Grade I diastolic  dysfunction (impaired relaxation). The average left ventricular global  longitudinal strain is -17.1 %. The global longitudinal strain is normal.   3. Right ventricular systolic function is normal. The right ventricular  size is normal.   4. Left atrial size was mildly dilated.   5. The mitral valve is normal in structure. Mild mitral valve  regurgitation. No evidence of mitral stenosis. Moderate mitral annular  calcification.   6. The inferior vena cava is normal in size with greater than 50%  respiratory variability, suggesting right atrial pressure of 3 mmHg.   Carotid duplex 08/14/22 Right Carotid: Velocities in the right ICA are consistent with a 1-39%  stenosis.   Left Carotid: Velocities in the left ICA are consistent with a 1-39%  stenosis.   Vertebrals: Bilateral vertebral arteries demonstrate antegrade flow.  Subclavians: Normal flow hemodynamics were seen in bilateral subclavian               arteries. Recent Labs: 09/04/2022: ALT 16 11/26/2022: BUN 29; Creatinine, Ser 2.03; Hemoglobin 11.6; Platelets 240.0; Potassium 4.0; Sodium 140; TSH 1.77   Recent Lipid Panel    Component Value Date/Time   CHOL 179 04/10/2022 1014   TRIG 68.0 04/10/2022 1014   HDL 53.70 04/10/2022 1014   CHOLHDL 3 04/10/2022 1014   VLDL 13.6 04/10/2022 1014   LDLCALC 111 (H) 04/10/2022 1014    LDLCALC 70 09/07/2020 1038   LDLDIRECT 98 09/04/2022 1152    Physical Exam:   VS:  BP (!) 199/96   Pulse 85   Ht 5' 6.5" (1.689 m)   Wt 153 lb (69.4 kg)   BMI 24.32 kg/m  , BMI Body mass index is 24.32 kg/m. GENERAL:  Well appearing HEENT: Pupils equal round and reactive, fundi not visualized, oral mucosa unremarkable NECK:  No jugular venous  distention, waveform within normal limits, carotid upstroke brisk and symmetric, no bruits, no thyromegaly LYMPHATICS:  No cervical adenopathy LUNGS:  Clear to auscultation bilaterally HEART:  RRR.  PMI not displaced or sustained,S1 and S2 within normal limits, no S3, no S4, no clicks, no rubs, Gr 3/6 murmurs ABD:  Flat, positive bowel sounds normal in frequency in pitch, no bruits, no rebound, no guarding, no midline pulsatile mass, no hepatomegaly, no splenomegaly EXT:  2 plus pulses throughout, no edema, no cyanosis no clubbing SKIN:  No rashes no nodules NEURO:  Cranial nerves II through XII grossly intact, motor grossly intact throughout PSYCH:  Cognitively intact, oriented to person place and time   ASSESSMENT/PLAN:    HTN -  Diagnosed with hypertension 4-5 years ago. Management complicated by renal function. 04/2021 renal artery duplex no stenosis. 03/2022 normal thyroid function. Labs including renin-aldosterone, catecholamines/metanephrines, TSH were normal. He does have white coat hypertension which was demonstrated during enrollment in [facility] study. BP elevated in clinic today as has not taken medications superimposed on white coat hypertension. Average systolic BP at home [id number]. Home BP cuff found to be reasonably accurate for systolic readings today. No recurrent lightheadedness. Continue Carvedilol CR '80mg'$  QD, Amlodipine '10mg'$  QD, Hydralazine '50mg'$  BID (lightheaded on higher doses), Doxazosin '8mg'$  QD.  Check in via MyChart in 3-4 weeks. If BP persistently elevated could consider adding small dose of Doxazosin in the AM vs further  increasing dose. Previously politely declined renal denervation.  Snores / sleep disordered breathing - Notes some snoring though wakes feeling rested. Longstanding history of insomnia. Previously politely declined sleep study.   CKD - Careful titration of diuretic and antihypertensive. 11/26/22 creatinine 2.03, GFR 32.  Established with nephrology  Anemia - 08/26/22 Hb 10.6. 11/26/22 Hb 11.6. On  OTC iron per PCP and nephrology.   DM2 - Continue to follow with PCP. Appreciate inclusion of SGLT2i for cardioprotective benefit.   Cardiac murmur - noted on exam. Echo 08/14/22 LVEF 60-65%, moderate calcification of aortic valve with mild AI and no stenosis, gr1DD, LA mildly dilated, mild MR, moderate mitral annular calcification. Consider repeat echo in 1 year for monitoring. Murmur likely due to aortic calcification.   HLD - Continue Atorvastatin per PCP.   Carotid artery stenosis - Carotid duplex 07/2020 with moderate left sided elevated plaque. Minimal amount of right sided plaque. 07/2022 carotid duplex bilateral 1-39% stenosis. Consider repeat duplex one year.   Screening for Secondary Hypertension:     07/07/2022    8:10 PM  Causes  Drugs/Herbals Screened  Renovascular HTN Screened  Thyroid Disease Screened  Hyperaldosteronism Screened  Pheochromocytoma Screened  Cushing's Syndrome Screened  Coarctation of the Aorta Screened    Relevant Labs/Studies:    Latest Ref Rng & Units 11/26/2022   11:26 AM 05/29/2022    9:26 AM 05/16/2022    7:41 AM  Basic Labs  Sodium 135 - 145 mEq/L 140  141  142   Potassium 3.5 - 5.1 mEq/L 4.0  4.7  4.5   Creatinine 0.40 - 1.50 mg/dL 2.03  1.89  2.16        Latest Ref Rng & Units 11/26/2022   11:26 AM 04/10/2022   10:14 AM  Thyroid   TSH 0.35 - 5.50 uIU/mL 1.77  1.35        Latest Ref Rng & Units 09/04/2022   11:52 AM  Renin/Aldosterone   Aldosterone 0.0 - 30.0 ng/dL <1.0   Aldos/Renin Ratio 0.0 - 30.0 <2.8  Latest Ref Rng & Units  09/04/2022   11:52 AM  Metanephrines/Catecholamines   Epinephrine 0 - 62 pg/mL 25   Norepinephrine 0 - 874 pg/mL 282   Dopamine 0 - 48 pg/mL <30   Metanephrines 0.0 - 88.0 pg/mL <25.0   Normetanephrines  0.0 - 285.2 pg/mL 31.9           Disposition:    FU with Dr. Oval Linsey or Loel Dubonnet, NP in 3-4 months  Medication Adjustments/Labs and Tests Ordered: Current medicines are reviewed at length with the patient today.  Concerns regarding medicines are outlined above.  No orders of the defined types were placed in this encounter.  No orders of the defined types were placed in this encounter.    Signed, Loel Dubonnet, NP  07/03/2022 9:20 AM    [place]

## 2023-01-08 NOTE — Patient Instructions (Signed)
Medication Instructions:  Your physician recommends that you continue on your current medications as directed. Please refer to the Current Medication list given to you today.  Follow-Up: 3-4 months with Dr. Oval Linsey or Laurann Montana, NP in [facility] HT[facility] CLINIC   Special Instructions:  Daphene Jaeger will check in with you in a few weeks about blood pressures.

## 2023-01-10 ENCOUNTER — Other Ambulatory Visit: Payer: Self-pay | Admitting: Family Medicine

## 2023-01-10 DIAGNOSIS — I10 Essential (primary) hypertension: Secondary | ICD-10-CM

## 2023-01-13 NOTE — Progress Notes (Signed)
Care Management & Coordination Services Pharmacy Note  01/13/2023 Name:  [name] MRN:  [id number] DOB:  1951-04-11  Summary: -BP not at goal, elevated in office today at 174/99 HR 50 -Taking old BP meds that have been stopped, not taking newly prescribed BP meds (in error, was confused) -LDL not at goal <70, also has not been taking statin by accident (never refilled) -A1C not at goal <7, hasn't checked sugars since January (lost his meter/batteries died) -Enrolled in [employer], med sync, and delivery services     Recommendations/Changes made from today's visit: -STOP HCTZ (was previously told to do so, med has been expired for 2 years, unclear how he had any on hand) -START hydralazine 50mg  BID as previously prescribed by PCP -Continue Carvedilol 25mg  BID (never started Coreg CR 80mg ) and Amlodipine 10mg  -Counseled on low sodium diet and eating less than 2300mg  -Check BP twice daily and keep a log -RESTART atorvastatin 40mg  as previously prescribed -START Dexcom G7 sensors, provided 20 day supply in office, get rx with PCP approval -Removed farxiga from med list as patient has not started, will consider re-adding once we have CGM device results   Follow up plan: -BP/DM call in 1 week -Pharmacist visit in 1-2 months   Subjective: [name] is an 72 y.o. year old male who is a primary patient of Deeann Saint, MD.  The care coordination team was consulted for assistance with disease management and care coordination needs.    Engaged with patient face to face for initial visit.  Recent office visits:  11/26/2022 [name] MD - Patient was seen for essential hypertension and additional concerns. Increased Carvedilol to 80 mg daily. Decreased Hydralazine to 50 mg three times daily.    08/18/2022 [name] MD - Patient was seen for Malignant hypertension and additional concerns. Started Ramelteon 8 mg 1 tablet 30 mg before bedtime.    07/17/2022  [name] MD - Patient was seen for other secondary hypertension and additional concerns. No medication changes.    05/05/2022 Elisha Ponder LPN - Encounter for Medicare annual wellness exam    Recent consult visits:  12/04/2022 Gillian Shields NP (cardiology) - Patient was seen for essential hypertension and additional concerns. Decreased Hydralazine 50 mg to twice daily.    11/03/2022 Chilton Si MD (cardiology) - Patient was seen for murmur and additional concerns. Increased Doxazosin to 8 mg at bedtime.   09/04/2022 Gillian Shields NP (cardiology) - Patient was seen for essential hypertension and additional concern. Started Doxazosin 2 mg at bedtime. Increased Amlodipine to 10 mg daily.    08/26/2022 Crista Elliot (Internal med) - Patient was seen for Type 2 diabetes mellitus with diabetic chronic kidney disease and additional concerns. No additional chart notes.   08/14/2022 Dina Rich (cardiology) - Patient was seen for occlusion and stenosis of bilateral carotid arteries. No additional chart notes.    08/14/2022 Donato Schultz MD (cardiology) - Patient was seen for cardiac murmur and essential hypertension. No additional chart notes.    Hospital visits:  None   Objective:  Lab Results  Component Value Date   CREATININE 2.03 (H) 11/26/2022   BUN 29 (H) 11/26/2022   GFR 32.61 (L) 11/26/2022   GFRNONAA 33 (L) 02/24/2021   GFRAA 62 09/07/2020   NA 140 11/26/2022   K 4.0 11/26/2022   CALCIUM 9.2 11/26/2022   CO2 31 11/26/2022   GLUCOSE 255 (H) 11/26/2022    Lab Results  Component Value Date/Time   HGBA1C 7.9 (  H) 11/26/2022 11:26 AM   HGBA1C 7.9 (A) 11/26/2022 10:32 AM   HGBA1C 6.5 (A) 06/12/2022 09:44 AM   HGBA1C 7.3 (H) 03/20/2021 03:05 PM   FRUCTOSAMINE 308 (H) 09/07/2020 10:38 AM   GFR 32.61 (L) 11/26/2022 11:26 AM   GFR 35.66 (L) 05/29/2022 09:26 AM   MICROALBUR 113.6 (H) 04/10/2022 10:21 AM   MICROALBUR 13.6 (H) 11/10/2018 10:52 AM    Last diabetic Eye  exam:  Lab Results  Component Value Date/Time   HMDIABEYEEXA Retinopathy (A) 07/13/2020 02:53 PM    Last diabetic Foot exam: No results found for: "HMDIABFOOTEX"   Lab Results  Component Value Date   CHOL 179 04/10/2022   HDL 53.70 04/10/2022   LDLCALC 111 (H) 04/10/2022   LDLDIRECT 98 09/04/2022   TRIG 68.0 04/10/2022   CHOLHDL 3 04/10/2022       Latest Ref Rng & Units 09/04/2022   11:52 AM 04/10/2022   10:14 AM 03/20/2021    3:05 PM  Hepatic Function  Total Protein 6.0 - 8.5 g/dL 6.0  6.8  6.3   Albumin 3.9 - 4.9 g/dL 3.6  3.7  3.6   AST 0 - 40 IU/L 23  27  17    ALT 0 - 44 IU/L 16  22  15    Alk Phosphatase 44 - 121 IU/L 80  77  73   Total Bilirubin 0.0 - 1.2 mg/dL 0.3  0.4  0.5   Bilirubin, Direct 0.00 - 0.40 mg/dL 2.72       Lab Results  Component Value Date/Time   TSH 1.77 11/26/2022 11:26 AM   TSH 1.35 04/10/2022 10:14 AM   FREET4 0.69 04/10/2022 10:14 AM   FREET4 0.9 09/07/2020 10:38 AM       Latest Ref Rng & Units 11/26/2022   11:26 AM 05/16/2022    7:41 AM 03/20/2021    3:05 PM  CBC  WBC 4.0 - 10.5 K/uL 4.2  4.9  3.6   Hemoglobin 13.0 - 17.0 g/dL 53.6  64.4  03.4   Hematocrit 39.0 - 52.0 % 32.5  33.0  28.4   Platelets 150.0 - 400.0 K/uL 240.0  217.0  228.0     Lab Results  Component Value Date/Time   VITAMINB12 329 09/07/2020 10:38 AM    Clinical ASCVD: No  The 10-year ASCVD risk score (Arnett DK, et al., 2019) is: 59.1%   Values used to calculate the score:     Age: 48 years     Sex: Male     Is Non-Hispanic African American: Yes     Diabetic: Yes     Tobacco smoker: No     Systolic Blood Pressure: 200 mmHg     Is BP treated: Yes     HDL Cholesterol: 53.7 mg/dL     Total Cholesterol: 179 mg/dL        7/42/5956   38:75 AM 11/26/2022   11:11 AM 07/17/2022    9:17 AM  Depression screen PHQ 2/9  Decreased Interest 0 0 0  Down, Depressed, Hopeless 0 0 0  PHQ - 2 Score 0 0 0  Altered sleeping 0 0 0  Tired, decreased energy 0 0 0  Change in  appetite 0 0 0  Feeling bad or failure about yourself  0 0 0  Trouble concentrating 0 0 0  Moving slowly or fidgety/restless 0 0 0  Suicidal thoughts 0 0 0  PHQ-9 Score 0 0 0  Difficult doing work/chores Not difficult at all Not  difficult at all      Social History   Tobacco Use  Smoking Status Never  Smokeless Tobacco Never   BP Readings from Last 3 Encounters:  01/08/23 (!) 200/88  01/07/23 (!) 233/117  12/04/22 (!) 206/102   Pulse Readings from Last 3 Encounters:  01/08/23 85  01/07/23 81  12/04/22 75   Wt Readings from Last 3 Encounters:  01/08/23 153 lb (69.4 kg)  01/07/23 155 lb 3.2 oz (70.4 kg)  12/04/22 162 lb 8 oz (73.7 kg)   BMI Readings from Last 3 Encounters:  01/08/23 24.32 kg/m  01/07/23 24.67 kg/m  12/04/22 25.84 kg/m    No Known Allergies  Medications Reviewed Today     Reviewed by Alver Sorrow, NP (Nurse Practitioner) on 01/08/23 at 1000  Med List Status: <None>   Medication Order Taking? Sig Documenting Provider Last Dose Status Informant  amLODipine (NORVASC) 10 MG tablet [id number] Yes Take 1 tablet (10 mg total) by mouth daily. Alver Sorrow, NP Taking Active   atorvastatin (LIPITOR) 40 MG tablet [id number] Yes TAKE 1 TABLET BY MOUTH EVERY DAY Deeann Saint, MD Taking Active   blood glucose meter kit and supplies [id number] Yes Dispense based on patient and insurance preference. Use to check glucose daily (FOR ICD-10 E10.9, E11.9). Everrett Coombe, DO Taking Active   carvedilol (COREG CR) 80 MG 24 hr capsule [id number] Yes Take 1 capsule (80 mg total) by mouth daily. Deeann Saint, MD Taking Active   Cholecalciferol (VITAMIN D3) 50 MCG (2000 UT) TABS [id number] Yes Take 1 tablet by mouth daily. [provider] Taking Active   Continuous Blood Gluc Receiver (FREESTYLE LIBRE 2 READER) DEVI [id number] Yes 1 Device by Does not apply route as directed. Deeann Saint, MD Taking Active   Continuous Blood Gluc Sensor (FREESTYLE  LIBRE 3 SENSOR) [place] [id number] Yes 1 Device by Does not apply route every 14 (fourteen) days. Place 1 sensor on the skin every 14 days. Use to check glucose continuously Deeann Saint, MD Taking Active   dapagliflozin propanediol (FARXIGA) 5 MG TABS tablet [id number] Yes Take 1 tablet (5 mg total) by mouth daily before breakfast. Deeann Saint, MD Taking Active   doxazosin (CARDURA) 8 MG tablet [id number] Yes Take 1 tablet (8 mg total) by mouth at bedtime. Chilton Si, MD Taking Active   ferrous gluconate (FERGON) 324 MG tablet [id number] Yes Take 1 tablet (324 mg total) by mouth daily with breakfast. Deeann Saint, MD Taking Active   hydrALAZINE (APRESOLINE) 50 MG tablet [id number] Yes Take 1 tablet (50 mg total) by mouth in the morning and at bedtime. Alver Sorrow, NP Taking Active   Insulin Pen Needle (BD PEN NEEDLE NANO 2ND GEN) 32G X 4 MM MISC [id number] Yes To use with insulin pen twice a day. Deeann Saint, MD Taking Active   Lancet Devices (ONE TOUCH DELICA LANCING DEV) MISC [id number] Yes 1 Device by Does not apply route as directed. Shamleffer, Konrad Dolores, MD Taking Active   latanoprost (XALATAN) 0.005 % ophthalmic solution [id number] Yes SMARTSIG:In Eye(s) [provider] Taking Active   NOVOLIN 70/30 KWIKPEN (70-30) 100 UNIT/ML KwikPen [id number] Yes INJECT 20 UNITS INTO THE SKIN DAILY BEFORE BREAKFAST AND 24 UNITS AT BEDTIME. Deeann Saint, MD Taking Active   OneTouch Delica Lancets 30G [place] [id number] Yes 1 Device by Does not apply route as directed. Use to test up to 4x daily. Everrett Coombe, DO Taking Active   [facility] ULTRA  test strip [id number] Yes USE UP TO 4X DAILY Everrett Coombe, DO Taking Active   ramelteon (ROZEREM) 8 MG tablet [id number] Yes Take 1 tablet (8 mg) 30 minutes before bedtime. Deeann Saint, MD Taking Active             SDOH:  (Social Determinants of Health) assessments and interventions performed: Yes SDOH Interventions     Flowsheet Row Telephone from 09/04/2022 in [facility] Visit from 07/03/2022 in [facility] Chronic Care Management from 05/09/2021 in [facility] [place] Clinical Support from 04/17/2021 in [facility] [place]  SDOH Interventions      Food Insecurity Interventions Intervention Not Indicated  [pt recieves SNAP] Intervention Not Indicated Intervention Not Indicated Intervention Not Indicated  Housing Interventions -- Intervention Not Indicated Intervention Not Indicated Intervention Not Indicated  Transportation Interventions Intervention Not Indicated -- Intervention Not Indicated Intervention Not Indicated  Utilities Interventions Other (Comment)  [pt with large water bill,  requested once he has plumber do repairs if there is an outstanding amount they cannot credit him back to let me know] -- -- --  Alcohol Usage Interventions -- Intervention Not Indicated (Score <7) -- --  Financial Strain Interventions Other (Comment)  [no issues but with water bill,  pt offered assistance once repairs complete] -- -- Intervention Not Indicated  Physical Activity Interventions -- -- -- Intervention Not Indicated  Stress Interventions -- -- -- Intervention Not Indicated  Social Connections Interventions -- -- -- Intervention Not Indicated       Medication Assistance: None required.  Patient affirms current coverage meets needs.  Medication Access: Within the past 30 days, how often has patient missed a dose of medication? Not taking several meds by accident Is a pillbox or other method used to improve adherence? No  Factors that may affect medication adherence? adverse effects of medications and lack of understanding of disease management Are meds synced by current pharmacy? No  Are meds delivered by current pharmacy? No  Does patient experience delays in picking up medications due  to transportation concerns? No   [facility] Reviewed: Is patient disadvantaged to use [facility]?: No  Current Rx insurance plan: [employer] Name and location of Current pharmacy:  [id number] [place], [place] - [address] [place] [address] Phone: [phone] Fax: [phone]  [facility] (New Address) - [place], [place] - [address] AT Previously: [place], [place] [address] [place] [address] Phone: [phone] Fax: [phone]  [facility] services reviewed with patient today?: No  Patient requests to transfer care to [facility]?: No  Reason patient declined to change pharmacies: Disadvantaged due to insurance/mail order  Compliance/Adherence/Medication fill history: Care Gaps: AWV - completed 05/05/2022 Last BP - 206/102 on 12/04/2022 Last A1C  - 7.9 on 11/26/2022 Last eye exam - 01/11/2020 Last foot exam - 03/22/2020 Tdap - never done Shingrix - never done Colonoscopy - never done Pneumovax - overdue Covid - overdue Flu - postponed  [facility] Drugs: Atorvastatin 40 mg - last [facility]  Farxiga 5mg  27% last filled 05/2022   Assessment/Plan   Hypertension (BP goal <140/90) -Uncontrolled -Current treatment: Amlodipine 10mg  1 qd Appropriate, Effective, Safe, Accessible Coreg CR 80mg  1 qd - NOT TAKING Carvedilol 25mg  BID Appropriate, Query Effective Hydralazine 50mg  BID -  NOT TAKING (Still taking HCTZ - VERY CONFUSED) HCTZ 25mg  1 tab daily Appropriate, Effective, Query Safe Cardura 8mg  1 qd --- unclear if taking, was not brought to appt today Appropriate, Query Effective -Medications previously tried: Diltiazem, HCTZ, Lisinopril, Losartan, Triamterene, Metoprolol -Current home readings: Did not bring a log, had RPM device but turned back into cardio -Current dietary habits: water,  drinks with "no sugar", see DM section for more details -Current exercise habits: Not discussed -Denies hypotensive/hypertensive symptoms -Educated on BP goals and benefits of medications for prevention of heart attack, stroke and kidney damage; Daily salt intake goal < 2300 mg; Importance of home blood pressure monitoring; Proper BP monitoring technique; -Counseled to monitor BP at home twice daily, document, and provide log at future appointments -Counseled on diet and exercise extensively STOP HCTZ as previously instructed, begin hydralazine 50mg  BID as previously instructed -Continue Carvedilol 25mg  BID and amlodipine 10mg  one daily -Future consideration: Obtain CMP and start low dose ARB for kidney protection to prevent progression into ESRD  Hyperlipidemia: (LDL goal < 70) -Not ideally controlled -Current treatment: Atorvastatin 40mg  1 qd - Not taking - ran out and never efilled -Medications previously tried: None  -Current dietary patterns: see DM section -Current exercise habits: not discussed -Educated on Cholesterol goals;  Benefits of statin for ASCVD risk reduction; Importance of limiting foods high in cholesterol; - RESTART Atorvastatin 40mg  once daily  Diabetes (A1c goal <7%) -Uncontrolled -Current medications: Farxiga 5mg  1 qd -  not taking Novolin 70/30 Kwikpen 22 units at breakfast, 22 units at bedtime -Medications previously tried: Metformin  -Current home glucose readings Has not checked sugars since January -Denies hypoglycemic/hyperglycemic symptoms -Current meal patterns:  breakfast: cereal  lunch: nabs with pb and cheese  dinner: varies, frozen meal snacks: corn chips drinks: water -Current exercise: not discussed -Educated on A1c and blood sugar goals; Complications of diabetes including kidney damage, retinal damage, and cardiovascular disease; Continuous glucose monitoring; Carbohydrate counting and/or plate method -Counseled to check feet daily  and get yearly eye exams -Recommended to continue current medication -Provided Dexcom G7 in office (reader and sensors), will get rx sent in, pending PCP approval  Chronic Kidney Disease Stage 3b  -All medications assessed for renal dosing and appropriateness in chronic kidney disease. -Counseled on no NSAID use, adequate hydration   Insomnia -Not assessed today  -Current treatment  Ramelteon 8mg  1 qhs Not taking  OTC  -Current treatment  Ferrous Gluconate 324mg  1 qd with breakfast Appropriate, Effective, Safe, Accessible Vitamin D 2000units 1 qd - not taking CoQ10 1 qd Appropriate, Effective, Safe, Accessible     Sherrill Raring Clinical Pharmacist [phone]

## 2023-01-14 ENCOUNTER — Telehealth: Payer: Self-pay | Admitting: Cardiovascular Disease

## 2023-01-14 ENCOUNTER — Ambulatory Visit: Payer: Medicare HMO

## 2023-01-14 DIAGNOSIS — I10 Essential (primary) hypertension: Secondary | ICD-10-CM

## 2023-01-14 NOTE — Telephone Encounter (Signed)
He has known white coat hypertension and monitors BP at home with readings routinely in 130s on cuff that has been checked for accuracy. He should continue Doxazosin 8mg  QHS. Based on chart review he should have refills but okay to send if needed.  If we can get a log of his home blood pressures we will be able to better assess if any medication changes are needed.   Loel Dubonnet, NP

## 2023-01-14 NOTE — Telephone Encounter (Signed)
Pt called requesting doxazosin refill, Laurann Montana NP confirmed pt to continue on current dose and frequency.  Noted patient had refills available and instructed him to call his pharmacy to request a refill.  Pt verbalized understanding. Georgana Curio MHA RN CCM

## 2023-01-14 NOTE — Telephone Encounter (Signed)
*  STAT* If patient is at the pharmacy, call can be transferred to refill team.   1. Which medications need to be refilled? (please list name of each medication and dose if known)   doxazosin (CARDURA) 8 MG tablet    2. Which pharmacy/location (including street and city if local pharmacy) is medication to be sent to? [facility] - [place], [place] - [place] [address]   3. Do they need a 30 day or 90 day supply? 90 day

## 2023-01-14 NOTE — Telephone Encounter (Signed)
Pt called in with refill request for Cardura.  Pt saw Pharm D. Today and did not have Cardura with him per Pharm D note.  Writer called patient who stated he is taking Cardura daily as prescribed.  Bp today was 174/99 HR 72. Routing note to [name] for potential medication adjustments pt verbalized understanding. Georgana Curio MHA RN CCM

## 2023-01-21 ENCOUNTER — Telehealth: Payer: Self-pay

## 2023-01-21 NOTE — Progress Notes (Signed)
Care Management & Coordination Services Pharmacy Team  Reason for Encounter: Hypertension  Contacted patient to discuss hypertension disease state. Unsuccessful outreach. Left voicemail for patient to return call. Multiple attempts  Current antihyperglycemic regimen:  Novolin 70/30 Kwikpen - 20 units before breakfast and 24 units at bedtime  Farxiga 5 mg before breakfast Current antihypertensive regimen:  Amlodipine 10 mg daily Carvedilol 80 mg daily (Continue Carvedilol 25mg  BID) Doxazosin 8 mg daily Hydralazine 50 mg twice daily Patient verbally confirms he is taking the above medications as directed.   How often are you checking your Blood Pressure? Patient is checking once daily.  he checks his blood pressure in the morning before taking his medication.  Current home BP readings:  DATE:             BP               PULSE    Any readings above 180/100?  If yes any symptoms of hypertensive emergency?   Patient denies hypoglycemic symptoms, including None  Patient denies hyperglycemic symptoms, including none  How often are you checking your blood sugar? in the morning before eating or drinking  What are your blood sugars ranging?  Fasting:  After meals:   During the week, how often does your blood glucose drop below 70?   What recent interventions/DTPs have been made by any provider to improve Blood Pressure and sugar control since last CPP Visit:  -STOP HCTZ  -START hydralazine 50mg  BID as previously prescribed by PCP -Continue Carvedilol 25mg  BID and Amlodipine 10mg  -Counseled on low sodium diet and eating less than 2300mg  -Check BP twice daily and keep a log -RESTART atorvastatin 40mg  as previously prescribed -START Dexcom G7 sensors, provided 20 day supply in office -Removed farxiga from med list as patient has not started  Any recent hospitalizations or ED visits since last visit with CPP?   What diet changes have been made to improve Blood Pressure  Control?             Last noted: Patient follows no specific way of eatling, tries to eat very little salt and sugar Breakfast - eggs and grits or a pancake Lunch - Peanut butter Nab crackers Dinner - a meal with meat, vegetable and starch (no bread) Caffeine intake - a coffee on rare occassion Salt intake - patient does not add salt  What exercise is being done to improve your Blood Pressure Control?    Adherence Review: Is the patient currently on a STATIN medication? Yes Is the patient currently on ACE/ARB medication? No Does the patient have >5 day gap between last estimated fill dates? No  Care Gaps: AWV - completed 05/05/2022 Last BP - 200/88 on 01/08/2023 Last A1C  - 7.9 on 11/26/2022 Last eye exam - 01/11/2020 Last foot exam - 03/22/2020 Tdap - never done Shingrix - never done Colonoscopy - never done Pneumovax - overdue Covid - overdue Flu - postponed   Star Rating Drugs: Atorvastatin 40 mg - last filled 12/15/2022 90 DS at [facility]  Chart Updates: Recent office visits:  None  Recent consult visits:  None  Hospital visits:  None  Medications: Outpatient Encounter Medications as of 01/21/2023  Medication Sig Note   amLODipine (NORVASC) 10 MG tablet Take 1 tablet (10 mg total) by mouth daily.    atorvastatin (LIPITOR) 40 MG tablet TAKE 1 TABLET BY MOUTH EVERY DAY 01/14/2023: Does not have, is not taking, forgot, unsure for how long   blood glucose  meter kit and supplies Dispense based on patient and insurance preference. Use to check glucose daily (FOR ICD-10 E10.9, E11.9).    carvedilol (COREG CR) 80 MG 24 hr capsule Take 1 capsule (80 mg total) by mouth daily. 01/14/2023: Not taking, still taking carvedilol 25mg  BID   Cholecalciferol (VITAMIN D3) 50 MCG (2000 UT) TABS Take 1 tablet by mouth daily. (Patient not taking: Reported on 01/14/2023)    Continuous Blood Gluc Receiver (FREESTYLE LIBRE 2 READER) DEVI 1 Device by Does not apply route as directed. (Patient not  taking: Reported on 01/14/2023)    Continuous Blood Gluc Sensor (FREESTYLE LIBRE 3 SENSOR) MISC 1 Device by Does not apply route every 14 (fourteen) days. Place 1 sensor on the skin every 14 days. Use to check glucose continuously (Patient not taking: Reported on 01/14/2023)    dapagliflozin propanediol (FARXIGA) 5 MG TABS tablet Take 1 tablet (5 mg total) by mouth daily before breakfast. (Patient not taking: Reported on 01/14/2023)    doxazosin (CARDURA) 8 MG tablet Take 1 tablet (8 mg total) by mouth at bedtime. 01/14/2023: Not sure he is actually taking, did not bring to appt today   ferrous gluconate (FERGON) 324 MG tablet Take 1 tablet (324 mg total) by mouth daily with breakfast.    hydrALAZINE (APRESOLINE) 50 MG tablet Take 1 tablet (50 mg total) by mouth in the morning and at bedtime. 01/14/2023: Not taking - still taking Hydrochlorothiazide 25mg  1 tab daily (thought they were the same)   Insulin Pen Needle (BD PEN NEEDLE NANO 2ND GEN) 32G X 4 MM MISC To use with insulin pen twice a day.    Lancet Devices (ONE TOUCH DELICA LANCING DEV) MISC 1 Device by Does not apply route as directed.    latanoprost (XALATAN) 0.005 % ophthalmic solution SMARTSIG:In Eye(s)    NOVOLIN 70/30 KWIKPEN (70-30) 100 UNIT/ML KwikPen INJECT 20 UNITS INTO THE SKIN DAILY BEFORE BREAKFAST AND 24 UNITS AT BEDTIME. 01/14/2023: 22 units qam and 22units qhs   OneTouch Delica Lancets 99991111 MISC 1 Device by Does not apply route as directed. Use to test up to 4x daily.    ONETOUCH ULTRA test strip USE UP TO 4X DAILY    ramelteon (ROZEREM) 8 MG tablet Take 1 tablet (8 mg) 30 minutes before bedtime. (Patient not taking: Reported on 01/14/2023)    No facility-administered encounter medications on file as of 01/21/2023.  Fill History:  Dispensed Days Supply Quantity Provider Pharmacy  AMLODIPINE 5MG  TAB 12/16/2022 90 90 tablet      Dispensed Days Supply Quantity Provider Pharmacy  ATORVASTATIN 40MG  TAB 12/15/2022 90 90 tablet       Dispensed Days Supply Quantity Provider Pharmacy  FARXIGA 5MG  TAB 05/29/2022 90 90 tablet      Dispensed Days Supply Quantity Provider Pharmacy  DOXAZOSIN 8MG  TAB 11/17/2022 90 90 tablet      Dispensed Days Supply Quantity Provider Pharmacy  HYDRALAZINE 50MG  TAB 12/04/2022 90 180 tablet      Dispensed Days Supply Quantity Provider Pharmacy  NOVOLIN F/P 70/30 PEN 07/20/2022 30 15 mL      Recent Office Vitals: BP Readings from Last 3 Encounters:  01/08/23 (!) 200/88  01/07/23 (!) 233/117  12/04/22 (!) 206/102   Pulse Readings from Last 3 Encounters:  01/08/23 85  01/07/23 81  12/04/22 75    Wt Readings from Last 3 Encounters:  01/08/23 153 lb (69.4 kg)  01/07/23 155 lb 3.2 oz (70.4 kg)  12/04/22 162 lb 8 oz (73.7  kg)    Kidney Function Lab Results  Component Value Date/Time   CREATININE 2.03 (H) 11/26/2022 11:26 AM   CREATININE 1.89 (H) 05/29/2022 09:26 AM   CREATININE 1.35 (H) 09/07/2020 10:38 AM   CREATININE 1.09 01/22/2016 11:37 AM   GFR 32.61 (L) 11/26/2022 11:26 AM   GFRNONAA 33 (L) 02/24/2021 08:17 PM   GFRNONAA 54 (L) 09/07/2020 10:38 AM   GFRAA 62 09/07/2020 10:38 AM       Latest Ref Rng & Units 11/26/2022   11:26 AM 05/29/2022    9:26 AM 05/16/2022    7:41 AM  BMP  Glucose 70 - 99 mg/dL 255  116  36   BUN 6 - 23 mg/dL 29  31  38   Creatinine 0.40 - 1.50 mg/dL 2.03  1.89  2.16   Sodium 135 - 145 mEq/L 140  141  142   Potassium 3.5 - 5.1 mEq/L 4.0  4.7  4.5   Chloride 96 - 112 mEq/L 102  107  108   CO2 19 - 32 mEq/L 31  26  26    Calcium 8.4 - 10.5 mg/dL 9.2  9.1  9.7    Recent Relevant Labs: Lab Results  Component Value Date/Time   HGBA1C 7.9 (H) 11/26/2022 11:26 AM   HGBA1C 7.9 (A) 11/26/2022 10:32 AM   HGBA1C 6.5 (A) 06/12/2022 09:44 AM   HGBA1C 7.3 (H) 03/20/2021 03:05 PM   MICROALBUR 113.6 (H) 04/10/2022 10:21 AM   MICROALBUR 13.6 (H) 11/10/2018 10:52 AM    Kidney Function Lab Results  Component Value Date/Time   CREATININE 2.03 (H)  11/26/2022 11:26 AM   CREATININE 1.89 (H) 05/29/2022 09:26 AM   CREATININE 1.35 (H) 09/07/2020 10:38 AM   CREATININE 1.09 01/22/2016 11:37 AM   GFR 32.61 (L) 11/26/2022 11:26 AM   GFRNONAA 33 (L) 02/24/2021 08:17 PM   GFRNONAA 54 (L) 09/07/2020 10:38 AM   GFRAA 62 09/07/2020 10:38 AM   [place]  Clinical Pharmacist Assistant [phone]

## 2023-01-22 ENCOUNTER — Other Ambulatory Visit: Payer: Self-pay

## 2023-01-22 ENCOUNTER — Observation Stay (HOSPITAL_BASED_OUTPATIENT_CLINIC_OR_DEPARTMENT_OTHER)
Admission: EM | Admit: 2023-01-22 | Discharge: 2023-01-23 | Disposition: A | Discharge status: Home / Self Care | Payer: Medicare HMO | Attending: Internal Medicine | Admitting: Internal Medicine

## 2023-01-22 ENCOUNTER — Emergency Department (HOSPITAL_BASED_OUTPATIENT_CLINIC_OR_DEPARTMENT_OTHER): Payer: Medicare HMO

## 2023-01-22 ENCOUNTER — Encounter (HOSPITAL_BASED_OUTPATIENT_CLINIC_OR_DEPARTMENT_OTHER): Payer: Self-pay | Admitting: Emergency Medicine

## 2023-01-22 DIAGNOSIS — H539 Unspecified visual disturbance: Secondary | ICD-10-CM | POA: Diagnosis present

## 2023-01-22 DIAGNOSIS — R29898 Other symptoms and signs involving the musculoskeletal system: Secondary | ICD-10-CM | POA: Diagnosis not present

## 2023-01-22 DIAGNOSIS — Z79899 Other long term (current) drug therapy: Secondary | ICD-10-CM | POA: Diagnosis not present

## 2023-01-22 DIAGNOSIS — E1122 Type 2 diabetes mellitus with diabetic chronic kidney disease: Secondary | ICD-10-CM | POA: Insufficient documentation

## 2023-01-22 DIAGNOSIS — H538 Other visual disturbances: Secondary | ICD-10-CM | POA: Diagnosis not present

## 2023-01-22 DIAGNOSIS — I129 Hypertensive chronic kidney disease with stage 1 through stage 4 chronic kidney disease, or unspecified chronic kidney disease: Secondary | ICD-10-CM | POA: Insufficient documentation

## 2023-01-22 DIAGNOSIS — E785 Hyperlipidemia, unspecified: Secondary | ICD-10-CM | POA: Diagnosis not present

## 2023-01-22 DIAGNOSIS — E1169 Type 2 diabetes mellitus with other specified complication: Secondary | ICD-10-CM

## 2023-01-22 DIAGNOSIS — R4781 Slurred speech: Secondary | ICD-10-CM | POA: Diagnosis not present

## 2023-01-22 DIAGNOSIS — E1165 Type 2 diabetes mellitus with hyperglycemia: Secondary | ICD-10-CM | POA: Diagnosis not present

## 2023-01-22 DIAGNOSIS — Z794 Long term (current) use of insulin: Secondary | ICD-10-CM | POA: Diagnosis not present

## 2023-01-22 DIAGNOSIS — N1832 Chronic kidney disease, stage 3b: Secondary | ICD-10-CM | POA: Diagnosis present

## 2023-01-22 DIAGNOSIS — G459 Transient cerebral ischemic attack, unspecified: Principal | ICD-10-CM | POA: Diagnosis present

## 2023-01-22 DIAGNOSIS — I10 Essential (primary) hypertension: Secondary | ICD-10-CM | POA: Diagnosis not present

## 2023-01-22 DIAGNOSIS — Z8546 Personal history of malignant neoplasm of prostate: Secondary | ICD-10-CM | POA: Insufficient documentation

## 2023-01-22 DIAGNOSIS — I152 Hypertension secondary to endocrine disorders: Secondary | ICD-10-CM | POA: Diagnosis not present

## 2023-01-22 DIAGNOSIS — R531 Weakness: Secondary | ICD-10-CM | POA: Diagnosis not present

## 2023-01-22 DIAGNOSIS — E1159 Type 2 diabetes mellitus with other circulatory complications: Secondary | ICD-10-CM | POA: Diagnosis not present

## 2023-01-22 LAB — RAPID URINE DRUG SCREEN, HOSP PERFORMED
Amphetamines: NOT DETECTED
Barbiturates: NOT DETECTED
Benzodiazepines: NOT DETECTED
Cocaine: NOT DETECTED
Opiates: NOT DETECTED
Tetrahydrocannabinol: NOT DETECTED

## 2023-01-22 LAB — URINALYSIS, ROUTINE W REFLEX MICROSCOPIC
Bacteria, UA: NONE SEEN
Bilirubin Urine: NEGATIVE
Glucose, UA: NEGATIVE mg/dL
Hgb urine dipstick: NEGATIVE
Ketones, ur: NEGATIVE mg/dL
Leukocytes,Ua: NEGATIVE
Nitrite: NEGATIVE
Protein, ur: 30 mg/dL — AB
Specific Gravity, Urine: 1.017 (ref 1.005–1.030)
pH: 6 (ref 5.0–8.0)

## 2023-01-22 LAB — COMPREHENSIVE METABOLIC PANEL
ALT: 13 U/L (ref 0–44)
AST: 30 U/L (ref 15–41)
Albumin: 3.2 g/dL — ABNORMAL LOW (ref 3.5–5.0)
Alkaline Phosphatase: 61 U/L (ref 38–126)
Anion gap: 8 (ref 5–15)
BUN: 39 mg/dL — ABNORMAL HIGH (ref 8–23)
CO2: 26 mmol/L (ref 22–32)
Calcium: 9 mg/dL (ref 8.9–10.3)
Chloride: 107 mmol/L (ref 98–111)
Creatinine, Ser: 2.46 mg/dL — ABNORMAL HIGH (ref 0.61–1.24)
GFR, Estimated: 27 mL/min — ABNORMAL LOW (ref >60)
Glucose, Bld: 100 mg/dL — ABNORMAL HIGH (ref 70–99)
Potassium: 4.4 mmol/L (ref 3.5–5.1)
Sodium: 141 mmol/L (ref 135–145)
Total Bilirubin: 0.3 mg/dL (ref 0.3–1.2)
Total Protein: 6.1 g/dL — ABNORMAL LOW (ref 6.5–8.1)

## 2023-01-22 LAB — DIFFERENTIAL
Abs Immature Granulocytes: 0.01 10*3/uL (ref 0.00–0.07)
Basophils Absolute: 0 10*3/uL (ref 0.0–0.1)
Basophils Relative: 1 %
Eosinophils Absolute: 0.1 10*3/uL (ref 0.0–0.5)
Eosinophils Relative: 3 %
Immature Granulocytes: 0 %
Lymphocytes Relative: 17 %
Lymphs Abs: 0.8 10*3/uL (ref 0.7–4.0)
Monocytes Absolute: 0.4 10*3/uL (ref 0.1–1.0)
Monocytes Relative: 9 %
Neutro Abs: 3 10*3/uL (ref 1.7–7.7)
Neutrophils Relative %: 70 %

## 2023-01-22 LAB — CBC
HCT: 28.9 % — ABNORMAL LOW (ref 39.0–52.0)
Hemoglobin: 10.2 g/dL — ABNORMAL LOW (ref 13.0–17.0)
MCH: 31.3 pg (ref 26.0–34.0)
MCHC: 35.3 g/dL (ref 30.0–36.0)
MCV: 88.7 fL (ref 80.0–100.0)
Platelets: 177 10*3/uL (ref 150–400)
RBC: 3.26 MIL/uL — ABNORMAL LOW (ref 4.22–5.81)
RDW: 12.6 % (ref 11.5–15.5)
WBC: 4.4 10*3/uL (ref 4.0–10.5)
nRBC: 0 % (ref 0.0–0.2)

## 2023-01-22 LAB — PROTIME-INR
INR: 1 (ref 0.8–1.2)
Prothrombin Time: 13.2 seconds (ref 11.4–15.2)

## 2023-01-22 LAB — ETHANOL: Alcohol, Ethyl (B): <10 mg/dL (ref <10)

## 2023-01-22 LAB — APTT: aPTT: 28 seconds (ref 24–36)

## 2023-01-22 LAB — CBG MONITORING, ED: Glucose-Capillary: 74 mg/dL (ref 70–99)

## 2023-01-22 MED ORDER — ASPIRIN 81 MG PO CHEW
81.0000 mg | CHEWABLE_TABLET | Freq: Once | ORAL | Status: AC
  Administered 2023-01-22: 81 mg via ORAL
  Filled 2023-01-22: qty 1

## 2023-01-22 MED ORDER — IOHEXOL 350 MG/ML SOLN: 100.0000 mL | Freq: Once | INTRAVENOUS | Status: DC | PRN

## 2023-01-22 MED ORDER — TENECTEPLASE FOR STROKE
PACK | INTRAVENOUS | Status: AC
  Filled 2023-01-22: qty 10

## 2023-01-22 NOTE — ED Triage Notes (Signed)
Slurred speech and blurred vision. Started 1630.

## 2023-01-22 NOTE — ED Notes (Addendum)
Patient transported to CT RN and MD at bedside

## 2023-01-22 NOTE — Progress Notes (Signed)
Plan of Care Note for accepted transfer   Patient: [name] MRN: [id number]   [place]: 01/22/2023  [facility]ility [facility] transfer: [facility]  Requesting Provider: Gareth Morgan, MD  Reason for transfer: TIA Facility course: 72 year old male with history of hypertension, DM 2, dyslipidemia, diabetic retinopathy, stage IIIb CKD, osteoarthritis and prostate cancer as well as depression, who presented to the ER with acute onset of dysarthria and visual changes with left blurred vision.  He was not considered to be a candidate for TNK as his symptoms resolved and he was asymptomatic.  He was seen by teleneurology.Marland Kitchen  Upon presentation to the ER vital signs revealed a BP of 152/74 with otherwise normal vital signs.  Labs revealed a BUN of 39 with a creatinine of 2.47 above previous levels with stage IIIb CKD with otherwise unremarkable CMP.  CBC showed anemia slightly worse than previous levels.  Alcohol level was less than 10.  EKG showed normal sinus rhythm with a rate of 68 with poor R wave progression. Code stroke CT of the head showed no acute intracranial abnormalities.  Head and neck CTA report is currently pending.  Plan of care: The patient is accepted for admission to Telemetry unit, at [facility]..   The patient will be under the care and responsibility of the ER physician until arrival to [facility].  Author: Christel Mormon, MD 01/22/2023  Check [email or web address] for on-call coverage.  Nursing staff, Please call [place] number on Amion as soon as patient's arrival, so appropriate admitting provider can evaluate the pt.

## 2023-01-22 NOTE — ED Notes (Signed)
Pt given lean cuisine meal and ice water. RN notified.

## 2023-01-22 NOTE — Progress Notes (Signed)
1711 stroke cart activated and elert sent to [facility]. Frederick Idolina Primer, MD) at bedside assessing pt at this time. Pt presents with c/o slurred speech and L vision changes with LKW 1630. 1716 pt to CT 1717 [facility] paged 1722 pt back from CT. [facility] Quinn Axe, MD) connected via stroke cart and assessing pt at this time.

## 2023-01-22 NOTE — Consult Note (Signed)
NEUROLOGY TELECONSULTATION NOTE   Date of service: January 22, 2023 Patient Name: [name] MRN:  [id number] DOB:  1951/05/10 Reason for consult: telestroke  Requesting Provider: Dr. Gareth Morgan Consult Participants: myself, patient, bedside RN, telestroke RN Location of the provider: [place], [place] Location of the patient: [facility]  This consult was provided via telemedicine with 2-way video and audio communication. The patient/family was informed that care would be provided in this way and agreed to receive care in this manner.   _ _ _   _ __   _ __ _ _  __ __   _ __   __ _  History of Present Illness   This is a 72 yo man with hx DM2, HTN, HL, CKD who presents after transient episode of L eye blurry vision and dysarthria. LKW 1630, sx lasted <60 min, now no focal neurologic deficits or other complaints. NIHSS = 0. CT head no acute process on personal review. TNK not administered 2/2 patient being asymptomatic.    ROS   Per HPI; all other systems reviewed and are negative  Past History   The following was personally reviewed:  Past Medical History:  Diagnosis Date   CKD (chronic kidney disease)    Diabetes mellitus without complication ([place])    Hyperlipidemia    Hypertension    Insomnia    Prostate cancer ([place])    Past Surgical History:  Procedure Laterality Date   PROSTATE BIOPSY     Family History  Problem Relation Age of Onset   Hypertension Mother    Colon cancer Mother    Diabetes Brother    Hypertension Daughter    Breast cancer Neg Hx    Pancreatic cancer Neg Hx    Prostate cancer Neg Hx    Social History   Socioeconomic History   Marital status: Single    Spouse name: Not on file   Number of children: 2   Years of education: Not on file   Highest education level: Not on file  Occupational History    Comment: retired  Tobacco Use   Smoking status: Never   Smokeless tobacco: Never  Vaping Use   Vaping Use: Never used  Substance and Sexual  Activity   Alcohol use: No   Drug use: No   Sexual activity: Yes  Other Topics Concern   Not on file  Social History Narrative   Not on file   Social Determinants of Health   Financial Resource Strain: Low Risk  (09/04/2022)   Overall Financial Resource Strain (CARDIA)    Difficulty of Paying Living Expenses: Not very hard  Food Insecurity: No Food Insecurity (01/14/2023)   Hunger Vital Sign    Worried About Running Out of Food in the Last Year: Never true    [place] in the Last Year: Never true  Transportation Needs: No Transportation Needs (01/14/2023)   PRAPARE - Hydrologist (Medical): No    Lack of Transportation (Non-Medical): No  Physical Activity: Inactive (05/05/2022)   Exercise Vital Sign    Days of Exercise per Week: 0 days    Minutes of Exercise per Session: 0 min  Stress: No Stress Concern Present (05/05/2022)   [place]    Feeling of Stress : Not at all  Social Connections: Socially Isolated (04/17/2021)   Social Connection and Isolation Panel [NHANES]    Frequency of Communication with Friends and Family:  Twice a week    Frequency of Social Gatherings with Friends and Family: Twice a week    Attends Religious Services: Never    Marine scientist or Organizations: No    Attends Music therapist: Never    Marital Status: Divorced   No Known Allergies  Medications   (Not in a hospital admission)     Current Facility-Administered Medications:    tenecteplase (TNKASE) 50 MG injection for Stroke, , , ,   Current Outpatient Medications:    amLODipine (NORVASC) 10 MG tablet, Take 1 tablet (10 mg total) by mouth daily., Disp: 90 tablet, Rfl: 3   atorvastatin (LIPITOR) 40 MG tablet, TAKE 1 TABLET BY MOUTH EVERY DAY, Disp: 90 tablet, Rfl: 1   blood glucose meter kit and supplies, Dispense based on patient and insurance preference. Use to check  glucose daily (FOR ICD-10 E10.9, E11.9)., Disp: 1 each, Rfl: 0   carvedilol (COREG CR) 80 MG 24 hr capsule, Take 1 capsule (80 mg total) by mouth daily., Disp: 30 capsule, Rfl: 1   Cholecalciferol (VITAMIN D3) 50 MCG (2000 UT) TABS, Take 1 tablet by mouth daily. (Patient not taking: Reported on 01/14/2023), Disp: , Rfl:    Continuous Blood Gluc Receiver (FREESTYLE LIBRE 2 READER) DEVI, 1 Device by Does not apply route as directed. (Patient not taking: Reported on 01/14/2023), Disp: 1 each, Rfl: 0   Continuous Blood Gluc Sensor (FREESTYLE LIBRE 3 SENSOR) MISC, 1 Device by Does not apply route every 14 (fourteen) days. Place 1 sensor on the skin every 14 days. Use to check glucose continuously (Patient not taking: Reported on 01/14/2023), Disp: 2 each, Rfl: 11   dapagliflozin propanediol (FARXIGA) 5 MG TABS tablet, Take 1 tablet (5 mg total) by mouth daily before breakfast. (Patient not taking: Reported on 01/14/2023), Disp: 90 tablet, Rfl: 1   doxazosin (CARDURA) 8 MG tablet, Take 1 tablet (8 mg total) by mouth at bedtime., Disp: 90 tablet, Rfl: 3   ferrous gluconate (FERGON) 324 MG tablet, Take 1 tablet (324 mg total) by mouth daily with breakfast., Disp: 90 tablet, Rfl: 1   hydrALAZINE (APRESOLINE) 50 MG tablet, Take 1 tablet (50 mg total) by mouth in the morning and at bedtime., Disp: 180 tablet, Rfl: 3   Insulin Pen Needle (BD PEN NEEDLE NANO 2ND GEN) 32G X 4 MM MISC, To use with insulin pen twice a day., Disp: 200 each, Rfl: 3   Lancet Devices (ONE TOUCH DELICA LANCING DEV) MISC, 1 Device by Does not apply route as directed., Disp: 1 each, Rfl: 0   latanoprost (XALATAN) 0.005 % ophthalmic solution, SMARTSIG:In Eye(s), Disp: , Rfl:    NOVOLIN 70/30 KWIKPEN (70-30) 100 UNIT/ML KwikPen, INJECT 20 UNITS INTO THE SKIN DAILY BEFORE BREAKFAST AND 24 UNITS AT BEDTIME., Disp: 15 mL, Rfl: 11   OneTouch Delica Lancets 99991111 MISC, 1 Device by Does not apply route as directed. Use to test up to 4x daily., Disp: 400  each, Rfl: 1   ONETOUCH ULTRA test strip, USE UP TO 4X DAILY, Disp: 300 strip, Rfl: 2   ramelteon (ROZEREM) 8 MG tablet, Take 1 tablet (8 mg) 30 minutes before bedtime. (Patient not taking: Reported on 01/14/2023), Disp: 30 tablet, Rfl: 1  Vitals   Vitals:   01/22/23 1715  BP: (!) 152/74  Pulse: 70  Resp: 18  Temp: 97.6 F (36.4 C)  TempSrc: Oral  SpO2: 97%     There is no height or weight on file  to calculate BMI.  Physical Exam   Exam performed over telemedicine with 2-way video and audio communication and with assistance of bedside RN  Physical Exam Gen: A&O x4, NAD Resp: normal WOB CV: extremities appear well-perfused  Neuro: *MS: A&O x4. Follows multi-step commands.  *Speech: nondysarthric, no aphasia, able to name and repeat *CN: PERRL 24mm, EOMI, VFF by confrontation, sensation intact, smile symmetric, hearing intact to voice *Motor:   Normal bulk.  No tremor, rigidity or bradykinesia. No pronator drift. All extremities appear full-strength and symmetric. *Sensory: SILT. Symmetric. No double-simultaneous extinction.  *Coordination:  Finger-to-nose, heel-to-shin, rapid alternating motions were intact. *Reflexes:  UTA 2/2 tele-exam *Gait: deferred  NIHSS = 0  Premorbid mRS = 0   Labs   CBC:  Recent Labs  Lab 01/22/23 1719  WBC 4.4  NEUTROABS 3.0  HGB 10.2*  HCT 28.9*  MCV 88.7  PLT [id number]    Basic Metabolic Panel:  Lab Results  Component Value Date   NA 140 11/26/2022   K 4.0 11/26/2022   CO2 31 11/26/2022   GLUCOSE 255 (H) 11/26/2022   BUN 29 (H) 11/26/2022   CREATININE 2.03 (H) 11/26/2022   CALCIUM 9.2 11/26/2022   GFRNONAA 33 (L) 02/24/2021   GFRAA 62 09/07/2020   Lipid Panel:  Lab Results  Component Value Date   LDLCALC 111 (H) 04/10/2022   HgbA1c:  Lab Results  Component Value Date   HGBA1C 7.9 (H) 11/26/2022   Urine Drug Screen: No results found for: "LABOPIA", "COCAINSCRNUR", "LABBENZ", "AMPHETMU", "THCU", "LABBARB"  Alcohol  Level No results found for: "ETH"   Impression   This is a 72 yo man with hx DM2, HTN, HL, CKD who presents after transient episode of L eye blurry vision and dysarthria, now resolved, c/f TIA.  Recommendations   - Admit to [facility] stroke workup - Permissive HTN x48 hrs from sx onset or until stroke ruled out by MRI goal BP <220/110. PRN labetalol or hydralazine if BP above these parameters. Avoid oral antihypertensives. - MRI brain wo contrast - CTA/MRA H&N if not already obtained - TTE  - Check A1c and LDL + add statin per guidelines - ASA 81mg  daily + plavix 75mg  daily x21 days f/b ASA 81mg  daily monotherapy after that - q4 hr neuro checks - STAT head CT for any change in neuro exam - Tele - PT/OT/SLP - Stroke education - Amb referral to neurology upon discharge   Please notify neurology on patient arrival to [facility]  ______________________________________________________________________   Thank you for the opportunity to take part in the care of this patient. If you have any further questions, please contact the neurology consultation attending.  Signed,  Su Monks, MD [facility] [phone]  If 7pm- 7am, please page neurology on call as listed in [place].  **Any copied and pasted documentation in this note was written by me in another application not billed for and pasted by me into this document.

## 2023-01-22 NOTE — ED Provider Notes (Signed)
[facility] Provider Note   CSN: [id number] Arrival date & time: 01/22/23  1703     History  Chief Complaint  Patient presents with   Code Stroke    [name] is a 72 y.o. male.  HPI     72yo male with history of htn, hlpd, prostate cancer who presents with concern for peripheral vision changes and speech changes.  Reports was normal at 430, going to the grocery store. When he was there he started to talk to someone and could not find his words or speak normally, he then noticed he couldn't really see in his periphery to the left. He went home and ate and decided to drie to the ED. His symptoms improving as he was driving over  Code stroke called on arrival by triage staff. Already improving during my evaluation and feels back to baseline as he was taken to CT>   Past Medical History:  Diagnosis Date   CKD (chronic kidney disease)    Diabetes mellitus without complication ([place])    Hyperlipidemia    Hypertension    Insomnia    Prostate cancer ([place])      Home Medications Prior to Admission medications   Medication Sig Start Date End Date Taking? Authorizing Provider  amLODipine (NORVASC) 10 MG tablet Take 1 tablet (10 mg total) by mouth daily. 09/04/22 08/30/23  Loel Dubonnet, NP  aspirin EC 81 MG tablet Take 1 tablet (81 mg total) by mouth daily. Swallow whole. 01/24/23   Annita Brod, MD  atorvastatin (LIPITOR) 80 MG tablet Take 1 tablet (80 mg total) by mouth daily. 01/23/23   Annita Brod, MD  blood glucose meter kit and supplies Dispense based on patient and insurance preference. Use to check glucose daily (FOR ICD-10 E10.9, E11.9). 11/12/18   Luetta Nutting, DO  carvedilol (COREG CR) 80 MG 24 hr capsule Take 1 capsule (80 mg total) by mouth daily. 11/26/22   [name], MD  clopidogrel (PLAVIX) 75 MG tablet Take 1 tablet (75 mg total) by mouth daily. 01/24/23   Annita Brod, MD  doxazosin (CARDURA)  8 MG tablet Take 1 tablet (8 mg total) by mouth at bedtime. 11/03/22   Skeet Latch, MD  ferrous gluconate (FERGON) 324 MG tablet Take 1 tablet (324 mg total) by mouth daily with breakfast. 11/26/22   [name], MD  hydrALAZINE (APRESOLINE) 50 MG tablet Take 1 tablet (50 mg total) by mouth in the morning and at bedtime. 12/04/22   Loel Dubonnet, NP  Insulin Pen Needle (BD PEN NEEDLE NANO 2ND GEN) 32G X 4 MM MISC To use with insulin pen twice a day. 09/27/21   [name], MD  Lancet Devices (ONE TOUCH DELICA LANCING DEV) MISC 1 Device by Does not apply route as directed. 07/06/19   Shamleffer, Melanie Crazier, MD  latanoprost (XALATAN) 0.005 % ophthalmic solution SMARTSIG:In Eye(s) 11/07/20   [provider]  NOVOLIN 70/30 KWIKPEN (70-30) 100 UNIT/ML KwikPen INJECT 20 UNITS INTO THE SKIN DAILY BEFORE BREAKFAST AND 24 UNITS AT BEDTIME. 06/17/22   [name], MD  OneTouch Delica Lancets [id number] MISC 1 Device by Does not apply route as directed. Use to test up to 4x daily. 08/04/19   Luetta Nutting, DO  ONETOUCH ULTRA test strip USE UP TO 4X DAILY 07/03/20   Luetta Nutting, DO      Allergies    Patient has no known allergies.  Review of Systems   Review of Systems  Physical Exam Updated Vital Signs BP (!) 160/85 (BP Location: Right Arm)   Pulse 78   Temp 98.1 F (36.7 C) (Oral)   Resp 19   Ht 5\' 6"  (1.676 m)   Wt 69.9 kg   SpO2 98%   BMI 24.87 kg/m  Physical Exam Constitutional:      General: He is not in acute distress.    Appearance: Normal appearance. He is not ill-appearing.  HENT:     Head: Normocephalic and atraumatic.  Eyes:     General: No visual field deficit.    Extraocular Movements: Extraocular movements intact.     Conjunctiva/sclera: Conjunctivae normal.     Pupils: Pupils are equal, round, and reactive to light.  Cardiovascular:     Rate and Rhythm: Normal rate and regular rhythm.     Pulses: Normal pulses.  Pulmonary:     Effort: Pulmonary  effort is normal. No respiratory distress.  Musculoskeletal:        General: No swelling or tenderness.     Cervical back: Normal range of motion.  Skin:    General: Skin is warm and dry.     Findings: No erythema or rash.  Neurological:     General: No focal deficit present.     Mental Status: He is alert and oriented to person, place, and time.     GCS: GCS eye subscore is 4. GCS verbal subscore is 5. GCS motor subscore is 6.     Cranial Nerves: No cranial nerve deficit, dysarthria or facial asymmetry.     Sensory: No sensory deficit.     Motor: No weakness or tremor.     Coordination: Coordination normal. Finger-Nose-Finger Test normal.     Gait: Gait normal.     Comments: Code stroke called by staff for difficulty speaking, on initial history having some difficulty with words but quickly improved on way to CT and visual changes resolved on way to CT< full Neurologic exam normal     ED Results / Procedures / Treatments   Labs (all labs ordered are listed, but only abnormal results are displayed) Labs Reviewed  CBC - Abnormal; Notable for the following components:      Result Value   RBC 3.26 (*)    Hemoglobin 10.2 (*)    HCT 28.9 (*)    All other components within normal limits  COMPREHENSIVE METABOLIC PANEL - Abnormal; Notable for the following components:   Glucose, Bld 100 (*)    BUN 39 (*)    Creatinine, Ser 2.46 (*)    Total Protein 6.1 (*)    Albumin 3.2 (*)    GFR, Estimated 27 (*)    All other components within normal limits  URINALYSIS, ROUTINE W REFLEX MICROSCOPIC - Abnormal; Notable for the following components:   Protein, ur 30 (*)    All other components within normal limits  LIPID PANEL - Abnormal; Notable for the following components:   Cholesterol 214 (*)    LDL Cholesterol 143 (*)    All other components within normal limits  CBC - Abnormal; Notable for the following components:   WBC 3.7 (*)    RBC 3.29 (*)    Hemoglobin 10.5 (*)    HCT 28.9 (*)     MCHC 36.3 (*)    All other components within normal limits  CREATININE, SERUM - Abnormal; Notable for the following components:   Creatinine, Ser 2.24 (*)  GFR, Estimated 31 (*)    All other components within normal limits  GLUCOSE, CAPILLARY - Abnormal; Notable for the following components:   Glucose-Capillary 135 (*)    All other components within normal limits  MRSA NEXT GEN BY PCR, NASAL  ETHANOL  PROTIME-INR  APTT  DIFFERENTIAL  RAPID URINE DRUG SCREEN, HOSP PERFORMED  HIV ANTIBODY (ROUTINE TESTING W REFLEX)  CBG MONITORING, ED    EKG EKG Interpretation  Date/Time:  Thursday January 22 2023 17:35:08 EDT Ventricular Rate:  68 PR Interval:  162 QRS Duration: 97 QT Interval:  430 QTC Calculation: 458 R Axis:   78 Text Interpretation: Sinus rhythm No significant change since last tracing Confirmed by Gareth Morgan [id number]) on 01/23/2023 12:02:27 PM  Radiology MR BRAIN WO CONTRAST  Result Date: 01/23/2023 CLINICAL DATA:  TIA EXAM: MRI HEAD WITHOUT CONTRAST TECHNIQUE: Multiplanar, multiecho pulse sequences of the brain and surrounding structures were obtained without intravenous contrast. COMPARISON:  Head CT from yesterday FINDINGS: Brain: No acute infarction, hemorrhage, hydrocephalus, extra-axial collection or mass lesion. Mild chronic small vessel ischemia in the cerebral white matter normal brain volume Vascular: Normal flow voids. Skull and upper cervical spine: Normal marrow signal. Sinuses/Orbits: Negative. IMPRESSION: No acute or reversible finding.  Mild chronic small vessel ischemia. Electronically Signed   By: Jorje Guild M.D.   On: 01/23/2023 04:14   CT HEAD CODE STROKE WO CONTRAST  Result Date: 01/22/2023 CLINICAL DATA:  Code stroke. Slurred speech and blurred vision, left-sided weakness EXAM: CT HEAD WITHOUT CONTRAST TECHNIQUE: Contiguous axial images were obtained from the base of the skull through the vertex without intravenous contrast. RADIATION DOSE  REDUCTION: This exam was performed according to the departmental dose-optimization program which includes automated exposure control, adjustment of the mA and/or kV according to patient size and/or use of iterative reconstruction technique. COMPARISON:  None Available. FINDINGS: Brain: No evidence of acute infarction, hemorrhage, mass, mass effect, or midline shift. No hydrocephalus or extra-axial collection. Periventricular white matter changes, likely the sequela of chronic small vessel ischemic disease. Vascular: No hyperdense vessel. Atherosclerotic calcifications in the intracranial carotid and vertebral arteries. Skull: Negative for fracture or focal lesion. Sinuses/Orbits: No acute finding. Other: The mastoid air cells are well aerated. ASPECTS [facility] Stroke Program Early CT Score) - Ganglionic level infarction (caudate, lentiform nuclei, internal capsule, insula, M1-M3 cortex): 7 - Supraganglionic infarction (M4-M6 cortex): 3 Total score (0-10 with 10 being normal): 10 IMPRESSION: 1. No acute intracranial process. 2. ASPECTS is 10 Code stroke imaging results were communicated on 01/22/2023 at 5:27 pm to provider [facility] via telephone, who verbally acknowledged these results. Electronically Signed   By: Merilyn Baba M.D.   On: 01/22/2023 17:28    Procedures Procedures    Medications Ordered in ED Medications  iohexol (OMNIPAQUE) 350 MG/ML injection 100 mL (has no administration in time range)  atorvastatin (LIPITOR) tablet 40 mg (40 mg Oral Given 01/23/23 [id number])   stroke: early stages of recovery book (has no administration in time range)  0.9 %  sodium chloride infusion (0 mLs Intravenous Stopped 01/23/23 1101)  acetaminophen (TYLENOL) tablet 650 mg (has no administration in time range)    Or  acetaminophen (TYLENOL) 160 MG/5ML solution 650 mg (has no administration in time range)    Or  acetaminophen (TYLENOL) suppository 650 mg (has no administration in time range)  senna-docusate  (Senokot-S) tablet 1 tablet (has no administration in time range)  heparin injection 5,000 Units (5,000 Units Subcutaneous Given 01/23/23 0553)  insulin  aspart (novoLOG) injection 0-9 Units (1 Units Subcutaneous Given 01/23/23 0703)  insulin aspart (novoLOG) injection 2 Units (2 Units Subcutaneous Given 01/23/23 [id number])  Oral care mouth rinse (has no administration in time range)  aspirin EC tablet 81 mg (81 mg Oral Given 01/23/23 0923)  clopidogrel (PLAVIX) tablet 75 mg (75 mg Oral Given 01/23/23 0923)  aspirin chewable tablet 81 mg (81 mg Oral Given 01/22/23 2200)    ED Course/ Medical Decision Making/ A&P                              72yo male with history of htn, hlpd, prostate cancer who presents with concern for peripheral vision changes and speech changes.  DDx includes ICH, seizure, electrolyte abnormality, TIA/CVA.  Code stroke called in triage, by the time I was evaluating him and going to CT he reports symptoms improved.   CT negative. Tele stroke Dr. Quinn Axe evaluated, tnk not given as asymptoamtic now.    Labs completed and personally evaluated by me show Cr mild elevation, is up and down in the past, glucose 100 on cmp, anemia stable, no UTI.  Consider episode of hypoglycemia that resolved but history concerning for TIA.   Admitted for further TIA evaluation.        Final Clinical Impression(s) / ED Diagnoses Final diagnoses:  Hyperlipidemia associated with type 2 diabetes mellitus ([place])  Uncontrolled type 2 diabetes mellitus with hyperglycemia ([place])  Essential hypertension    Rx / DC Orders ED Discharge Orders          Ordered    aspirin EC 81 MG tablet  Daily        01/23/23 1036    clopidogrel (PLAVIX) 75 MG tablet  Daily        01/23/23 1043    atorvastatin (LIPITOR) 80 MG tablet  Daily        01/23/23 1036    Increase activity slowly        01/23/23 1043    Diet - low sodium heart healthy        01/23/23 1043              Gareth Morgan,  MD 01/23/23 1212

## 2023-01-23 ENCOUNTER — Observation Stay (HOSPITAL_COMMUNITY): Payer: Medicare HMO

## 2023-01-23 DIAGNOSIS — N1832 Chronic kidney disease, stage 3b: Secondary | ICD-10-CM

## 2023-01-23 DIAGNOSIS — I129 Hypertensive chronic kidney disease with stage 1 through stage 4 chronic kidney disease, or unspecified chronic kidney disease: Secondary | ICD-10-CM | POA: Diagnosis not present

## 2023-01-23 DIAGNOSIS — G459 Transient cerebral ischemic attack, unspecified: Secondary | ICD-10-CM | POA: Diagnosis not present

## 2023-01-23 DIAGNOSIS — Z8546 Personal history of malignant neoplasm of prostate: Secondary | ICD-10-CM | POA: Diagnosis not present

## 2023-01-23 DIAGNOSIS — Z794 Long term (current) use of insulin: Secondary | ICD-10-CM | POA: Diagnosis not present

## 2023-01-23 DIAGNOSIS — R29898 Other symptoms and signs involving the musculoskeletal system: Secondary | ICD-10-CM | POA: Diagnosis not present

## 2023-01-23 DIAGNOSIS — E1165 Type 2 diabetes mellitus with hyperglycemia: Secondary | ICD-10-CM | POA: Diagnosis not present

## 2023-01-23 DIAGNOSIS — I10 Essential (primary) hypertension: Secondary | ICD-10-CM | POA: Diagnosis not present

## 2023-01-23 DIAGNOSIS — Z79899 Other long term (current) drug therapy: Secondary | ICD-10-CM | POA: Diagnosis not present

## 2023-01-23 DIAGNOSIS — H539 Unspecified visual disturbance: Secondary | ICD-10-CM | POA: Diagnosis not present

## 2023-01-23 DIAGNOSIS — E1122 Type 2 diabetes mellitus with diabetic chronic kidney disease: Secondary | ICD-10-CM | POA: Diagnosis not present

## 2023-01-23 LAB — LIPID PANEL
Cholesterol: 214 mg/dL — ABNORMAL HIGH (ref 0–200)
HDL: 52 mg/dL (ref >40)
LDL Cholesterol: 143 mg/dL — ABNORMAL HIGH (ref 0–99)
Total CHOL/HDL Ratio: 4.1 RATIO
Triglycerides: 97 mg/dL (ref <150)
VLDL: 19 mg/dL (ref 0–40)

## 2023-01-23 LAB — GLUCOSE, CAPILLARY: Glucose-Capillary: 135 mg/dL — ABNORMAL HIGH (ref 70–99)

## 2023-01-23 LAB — CBC
HCT: 28.9 % — ABNORMAL LOW (ref 39.0–52.0)
Hemoglobin: 10.5 g/dL — ABNORMAL LOW (ref 13.0–17.0)
MCH: 31.9 pg (ref 26.0–34.0)
MCHC: 36.3 g/dL — ABNORMAL HIGH (ref 30.0–36.0)
MCV: 87.8 fL (ref 80.0–100.0)
Platelets: 175 10*3/uL (ref 150–400)
RBC: 3.29 MIL/uL — ABNORMAL LOW (ref 4.22–5.81)
RDW: 12.4 % (ref 11.5–15.5)
WBC: 3.7 10*3/uL — ABNORMAL LOW (ref 4.0–10.5)
nRBC: 0 % (ref 0.0–0.2)

## 2023-01-23 LAB — CREATININE, SERUM
Creatinine, Ser: 2.24 mg/dL — ABNORMAL HIGH (ref 0.61–1.24)
GFR, Estimated: 31 mL/min — ABNORMAL LOW (ref >60)

## 2023-01-23 LAB — HIV ANTIBODY (ROUTINE TESTING W REFLEX): HIV Screen 4th Generation wRfx: NONREACTIVE

## 2023-01-23 LAB — MRSA NEXT GEN BY PCR, NASAL: MRSA by PCR Next Gen: NOT DETECTED

## 2023-01-23 MED ORDER — STROKE: EARLY STAGES OF RECOVERY BOOK: Freq: Once | Status: DC

## 2023-01-23 MED ORDER — ATORVASTATIN CALCIUM 80 MG PO TABS: 80.0000 mg | ORAL_TABLET | Freq: Every day | ORAL | 11 refills | Status: DC

## 2023-01-23 MED ORDER — SODIUM CHLORIDE 0.9 % IV SOLN: INTRAVENOUS | Status: DC

## 2023-01-23 MED ORDER — ACETAMINOPHEN 325 MG PO TABS: 650.0000 mg | ORAL_TABLET | ORAL | Status: DC | PRN

## 2023-01-23 MED ORDER — CLOPIDOGREL BISULFATE 75 MG PO TABS
75.0000 mg | ORAL_TABLET | Freq: Every day | ORAL | Status: DC
  Administered 2023-01-23: 75 mg via ORAL
  Filled 2023-01-23: qty 1

## 2023-01-23 MED ORDER — ATORVASTATIN CALCIUM 40 MG PO TABS
40.0000 mg | ORAL_TABLET | Freq: Every day | ORAL | Status: DC
  Administered 2023-01-23: 40 mg via ORAL
  Filled 2023-01-23: qty 1

## 2023-01-23 MED ORDER — HEPARIN SODIUM (PORCINE) 5000 UNIT/ML IJ SOLN
5000.0000 [IU] | Freq: Three times a day (TID) | INTRAMUSCULAR | Status: DC
  Administered 2023-01-23: 5000 [IU] via SUBCUTANEOUS
  Filled 2023-01-23: qty 1

## 2023-01-23 MED ORDER — ASPIRIN 81 MG PO TBEC
81.0000 mg | DELAYED_RELEASE_TABLET | Freq: Every day | ORAL | Status: DC
  Administered 2023-01-23: 81 mg via ORAL
  Filled 2023-01-23: qty 1

## 2023-01-23 MED ORDER — ORAL CARE MOUTH RINSE: 15.0000 mL | OROMUCOSAL | Status: DC | PRN

## 2023-01-23 MED ORDER — SENNOSIDES-DOCUSATE SODIUM 8.6-50 MG PO TABS: 1.0000 | ORAL_TABLET | Freq: Every evening | ORAL | Status: DC | PRN

## 2023-01-23 MED ORDER — ACETAMINOPHEN 650 MG RE SUPP: 650.0000 mg | RECTAL | Status: DC | PRN

## 2023-01-23 MED ORDER — ACETAMINOPHEN 160 MG/5ML PO SOLN: 650.0000 mg | ORAL | Status: DC | PRN

## 2023-01-23 MED ORDER — CLOPIDOGREL BISULFATE 75 MG PO TABS: 75.0000 mg | ORAL_TABLET | Freq: Every day | ORAL | 0 refills | Status: DC

## 2023-01-23 MED ORDER — INSULIN ASPART 100 UNIT/ML IJ SOLN
2.0000 [IU] | Freq: Three times a day (TID) | INTRAMUSCULAR | Status: DC
  Administered 2023-01-23: 2 [IU] via SUBCUTANEOUS

## 2023-01-23 MED ORDER — INSULIN ASPART 100 UNIT/ML IJ SOLN
0.0000 [IU] | Freq: Three times a day (TID) | INTRAMUSCULAR | Status: DC
  Administered 2023-01-23: 1 [IU] via SUBCUTANEOUS

## 2023-01-23 MED ORDER — ASPIRIN 81 MG PO TBEC: 81.0000 mg | DELAYED_RELEASE_TABLET | Freq: Every day | ORAL | 12 refills | Status: DC

## 2023-01-23 NOTE — Assessment & Plan Note (Signed)
As stated above.  Blood pressure medications clarified and reviewed with patient

## 2023-01-23 NOTE — Discharge Summary (Signed)
Physician Discharge Summary   Patient: [name] MRN: [id number] DOB: 01/07/1951  Admit date:     01/22/2023  Discharge date: 01/23/23  Discharge Physician: Annita Brod   PCP: [name], MD   Recommendations at discharge:   New medication: Aspirin 81 mg p.o. daily New medication: Plavix 75 mg p.o. daily x 3 weeks Medication change: Lipitor increased from 40 mg to 80 mg daily Please note additional medications for blood pressure listed in patient's [facility] are clarification of his antihypertensive regimen.  There has been some confusion and this is based off of his cardiologist most recent notes.  I clarified this with the patient who says he will follow this.  No  Discharge Diagnoses: Principal Problem:   TIA (transient ischemic attack) Active Problems:   Essential hypertension   Uncontrolled type 2 diabetes mellitus with hyperglycemia (HCC)   Chronic kidney disease, stage 3b (HCC)  Resolved Problems:   * No resolved hospital problems. [facility] Course: Patient is a 72 year old male with past medical history of hypertension, diabetes mellitus type 2, secondary retinopathy, prostate CA and stage IIIb CKD who presented to the emergency room on the evening of 3/28 with sudden onset dysarthria and blurred vision of the left eye.  Soon after arrival to the emergency room, symptoms resolved.  Patient evaluated by neurology brought in for further evaluation for TIA.  Following admission, MRI negative for acute CVA.  Assessment and Plan: * TIA (transient ischemic attack) As symptoms resolved and patient felt better, he did not want any additional workup.  He declined carotid Dopplers and echocardiogram.  He declined PT and swallow eval said he is feeling fine.  He was somewhat skeptical about having a TIA, but was willing to take medications accordingly.  Likely small vessel disease.  Have added aspirin 81 mg p.o. daily plus Plavix 75 mg p.o. daily x 3 weeks.  Looking at  risk factors, certainly controlled blood pressure, blood sugars and lipids were all needed.  Had extensive discussion with patient.  Clarified his recently adjusted regimen for blood pressure.  Patient states compliance with Lipitor up until 1 week ago when he ran out.  LDL at 143 with target below 70.  Have increased Lipitor from 40 mg to 80 mg.  Patient states overall his sugars are decently well-controlled.  Essential hypertension As stated above.  Blood pressure medications clarified and reviewed with patient  Uncontrolled type 2 diabetes mellitus with hyperglycemia ([place]) As above.  Discussed with patient.  Chronic kidney disease, stage 3b ([place]) Secondary to uncontrolled diabetes and hypertension.  At baseline although kidneys are not in great shape and patient should follow-up with a nephrologist.         Consultants: Neurology Procedures performed: None Disposition: Home Diet recommendation:  Discharge Diet Orders (From admission, onward)     Start     Ordered   01/23/23 0000  Diet - low sodium heart healthy        01/23/23 1043           Cardiac and Carb modified diet DISCHARGE MEDICATION: Allergies as of 01/23/2023   No Known Allergies      Medication List     STOP taking these medications    dapagliflozin propanediol 5 MG Tabs tablet Commonly known as: Farxiga   metoprolol tartrate 25 MG tablet Commonly known as: LOPRESSOR       TAKE these medications    amLODipine 10 MG tablet Commonly known as: NORVASC Take 1  tablet (10 mg total) by mouth daily.   aspirin EC 81 MG tablet Take 1 tablet (81 mg total) by mouth daily. Swallow whole. Start taking on: January 24, 2023   atorvastatin 80 MG tablet Commonly known as: LIPITOR Take 1 tablet (80 mg total) by mouth daily. What changed:  medication strength how much to take   BD Pen Needle Nano 2nd Gen 32G X 4 MM Misc Generic drug: Insulin Pen Needle To use with insulin pen twice a day.   blood  glucose meter kit and supplies Dispense based on patient and insurance preference. Use to check glucose daily (FOR ICD-10 E10.9, E11.9).   carvedilol 80 MG 24 hr capsule Commonly known as: Coreg CR Take 1 capsule (80 mg total) by mouth daily.   clopidogrel 75 MG tablet Commonly known as: PLAVIX Take 1 tablet (75 mg total) by mouth daily. Start taking on: January 24, 2023   doxazosin 8 MG tablet Commonly known as: Cardura Take 1 tablet (8 mg total) by mouth at bedtime.   ferrous gluconate 324 MG tablet Commonly known as: FERGON Take 1 tablet (324 mg total) by mouth daily with breakfast.   hydrALAZINE 50 MG tablet Commonly known as: APRESOLINE Take 1 tablet (50 mg total) by mouth in the morning and at bedtime.   latanoprost 0.005 % ophthalmic solution Commonly known as: XALATAN SMARTSIG:In Eye(s)   NovoLIN 70/30 Kwikpen (70-30) 100 UNIT/ML KwikPen Generic drug: insulin isophane & regular human KwikPen INJECT 20 UNITS INTO THE SKIN DAILY BEFORE BREAKFAST AND 24 UNITS AT BEDTIME.   ONE TOUCH DELICA LANCING DEV Misc 1 Device by Does not apply route as directed.   OneTouch Delica Lancets [id number] Misc 1 Device by Does not apply route as directed. Use to test up to 4x daily.   OneTouch Ultra test strip Generic drug: glucose blood USE UP TO 4X DAILY        Discharge Exam: Filed Weights   01/23/23 0054  Weight: 69.9 kg   General: Alert and oriented x 3, no acute distress Cardiovascular: Regular rate rhythm, S1-S2  Condition at discharge: good  The results of significant diagnostics from this hospitalization (including imaging, microbiology, ancillary and laboratory) are listed below for reference.   Imaging Studies: MR BRAIN WO CONTRAST  Result Date: 01/23/2023 CLINICAL DATA:  TIA EXAM: MRI HEAD WITHOUT CONTRAST TECHNIQUE: Multiplanar, multiecho pulse sequences of the brain and surrounding structures were obtained without intravenous contrast. COMPARISON:  Head CT from  yesterday FINDINGS: Brain: No acute infarction, hemorrhage, hydrocephalus, extra-axial collection or mass lesion. Mild chronic small vessel ischemia in the cerebral white matter normal brain volume Vascular: Normal flow voids. Skull and upper cervical spine: Normal marrow signal. Sinuses/Orbits: Negative. IMPRESSION: No acute or reversible finding.  Mild chronic small vessel ischemia. Electronically Signed   By: Jorje Guild M.D.   On: 01/23/2023 04:14   CT HEAD CODE STROKE WO CONTRAST  Result Date: 01/22/2023 CLINICAL DATA:  Code stroke. Slurred speech and blurred vision, left-sided weakness EXAM: CT HEAD WITHOUT CONTRAST TECHNIQUE: Contiguous axial images were obtained from the base of the skull through the vertex without intravenous contrast. RADIATION DOSE REDUCTION: This exam was performed according to the departmental dose-optimization program which includes automated exposure control, adjustment of the mA and/or kV according to patient size and/or use of iterative reconstruction technique. COMPARISON:  None Available. FINDINGS: Brain: No evidence of acute infarction, hemorrhage, mass, mass effect, or midline shift. No hydrocephalus or extra-axial collection. Periventricular white matter changes, likely  the sequela of chronic small vessel ischemic disease. Vascular: No hyperdense vessel. Atherosclerotic calcifications in the intracranial carotid and vertebral arteries. Skull: Negative for fracture or focal lesion. Sinuses/Orbits: No acute finding. Other: The mastoid air cells are well aerated. ASPECTS [facility] Stroke Program Early CT Score) - Ganglionic level infarction (caudate, lentiform nuclei, internal capsule, insula, M1-M3 cortex): 7 - Supraganglionic infarction (M4-M6 cortex): 3 Total score (0-10 with 10 being normal): 10 IMPRESSION: 1. No acute intracranial process. 2. ASPECTS is 10 Code stroke imaging results were communicated on 01/22/2023 at 5:27 pm to provider [facility] via telephone, who  verbally acknowledged these results. Electronically Signed   By: Merilyn Baba M.D.   On: 01/22/2023 17:28    Microbiology: Results for orders placed or performed during the hospital encounter of 01/22/23  MRSA Next Gen by PCR, Nasal     Status: None   Collection Time: 01/23/23  2:13 AM   Specimen: Nasal Mucosa; Nasal Swab  Result Value Ref Range Status   MRSA by PCR Next Gen NOT DETECTED NOT DETECTED Final    Comment: (NOTE) The GeneXpert MRSA Assay (FDA approved for NASAL specimens only), is one component of a comprehensive MRSA colonization surveillance program. It is not intended to diagnose MRSA infection nor to guide or monitor treatment for MRSA infections. Test performance is not FDA approved in patients less than 79 years old. Performed at [facility], [place] [address], [place], [place] [address]     Labs: CBC: Recent Labs  Lab 01/22/23 1719 01/23/23 0504  WBC 4.4 3.7*  NEUTROABS 3.0  --   HGB 10.2* 10.5*  HCT 28.9* 28.9*  MCV 88.7 87.8  PLT 177 0000000   Basic Metabolic Panel: Recent Labs  Lab 01/22/23 1719 01/23/23 0504  NA 141  --   K 4.4  --   CL 107  --   CO2 26  --   GLUCOSE 100*  --   BUN 39*  --   CREATININE 2.46* 2.24*  CALCIUM 9.0  --    Liver Function Tests: Recent Labs  Lab 01/22/23 1719  AST 30  ALT 13  ALKPHOS 61  BILITOT 0.3  PROT 6.1*  ALBUMIN 3.2*   CBG: Recent Labs  Lab 01/22/23 1716 01/23/23 0658  GLUCAP 74 135*    Discharge time spent: less than 30 minutes.  Signed: Annita Brod, MD [facility] 01/23/2023

## 2023-01-23 NOTE — Evaluation (Signed)
Physical Therapy Evaluation and Discharge Patient Details Name: [name] MRN: [id number] DOB: [birth year] Today's Date: 01/23/2023  History of Present Illness  Pt is a 72 y.o. M who presents 01/22/2023 with blurry vision and difficulty speaking. Pt noted symptoms lasted for 10 minutes and has not recurred. CTH and MRI negative. Significant PMH: DM2, HTN, HLD, CKD IIIB, prostate CA.  Clinical Impression  Patient evaluated by Physical Therapy with no further acute PT needs identified. 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 Pt reports resolution of symptoms. Pt ambulating hallway distance and negotiated stairs independently. Education provided regarding BEFAST stroke symptoms. All education has been completed and the patient has no further questions. No follow-up Physical Therapy or equipment needs. PT is signing off. Thank you for this referral.     Recommendations for follow up therapy are one component of a multi-disciplinary discharge planning process, led by the attending physician.  Recommendations may be updated based on patient status, additional functional criteria and insurance authorization.         Assistance Recommended at Discharge None  Patient can return home with the following       Equipment Recommendations None recommended by PT  Recommendations for Other Services       Functional Status Assessment Patient has not had a recent decline in their functional status     Precautions / Restrictions Precautions Precautions: None Restrictions Weight Bearing Restrictions: No      Mobility  Bed Mobility Overal bed mobility: Independent                  Transfers Overall transfer level: Independent Equipment used: None                    Ambulation/Gait Ambulation/Gait assistance: Independent Gait Distance (Feet): 380 Feet Assistive device: None Gait Pattern/deviations: WFL(Within Functional Limits)          Stairs Stairs:  Yes Stairs assistance: Independent Stair Management: No rails Number of Stairs: 5 General stair comments: step over step pattern  Wheelchair Mobility    Modified Rankin (Stroke Patients Only) Modified Rankin (Stroke Patients Only) Pre-Morbid Rankin Score: No symptoms Modified Rankin: No symptoms     Balance Overall balance assessment: No apparent balance deficits (not formally assessed)                                           Pertinent Vitals/Pain Pain Assessment Pain Assessment: No/denies pain    Home Living Family/patient expects to be discharged to:: Private residence Living Arrangements: Alone   Type of Home: House Home Access: Level entry                Prior Function Prior Level of Function : Independent/Modified Independent;Working/employed             Mobility Comments: self employed; 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, newspaper        Extremity/Trunk Assessment   Upper Extremity Assessment Upper Extremity Assessment: Overall WFL for tasks assessed    Lower Extremity Assessment Lower Extremity Assessment: Overall WFL for tasks assessed    Cervical / Trunk Assessment Cervical / Trunk Assessment: Normal  Communication   Communication: No difficulties  Cognition Arousal/Alertness: Awake/alert Behavior During Therapy: WFL for tasks assessed/performed Overall Cognitive Status: Within Functional Limits for tasks assessed  General Comments      Exercises     Assessment/Plan    PT Assessment Patient does not need any further PT services  PT Problem List         PT Treatment Interventions      PT Goals (Current goals can be found in the Care Plan section)  Acute Rehab PT Goals Patient Stated Goal: go home PT Goal Formulation: All assessment and education complete, DC therapy    Frequency       Co-evaluation               AM-PAC PT "6  Clicks" Mobility  Outcome Measure Help needed turning from your back to your side while in a flat bed without using bedrails?: None Help needed moving from lying on your back to sitting on the side of a flat bed without using bedrails?: None Help needed moving to and from a bed to a chair (including a wheelchair)?: None Help needed standing up from a chair using your arms (e.g., wheelchair or bedside chair)?: None Help needed to walk in hospital room?: None Help needed climbing 3-5 steps with a railing? : None 6 Click Score: 24    End of Session   Activity Tolerance: Patient tolerated treatment well Patient left: in bed;with call bell/phone within reach Nurse Communication: Mobility status;Other (comment) (pt indep) PT Visit Diagnosis: Other symptoms and signs involving the nervous system [id number])    [id number] PT Time Calculation (min) (ACUTE ONLY): 12 min   Charges:   PT Evaluation $PT Eval Low Complexity: [place], PT, DPT Acute Rehabilitation Services Office [phone]   Deno Etienne 01/23/2023, 9:18 AM

## 2023-01-23 NOTE — Assessment & Plan Note (Signed)
As above.  Discussed with patient.

## 2023-01-23 NOTE — Assessment & Plan Note (Signed)
Secondary to uncontrolled diabetes and hypertension.  At baseline although kidneys are not in great shape and patient should follow-up with a nephrologist.

## 2023-01-23 NOTE — H&P (Signed)
History and Physical    Rajat Bagent [id number] DOB: September 07, 1951 DOA: 01/22/2023  PCP: [name], MD  Patient coming from:  [place]  I have personally briefly reviewed patient's old medical records in [place]  Chief Complaint: vision changes   HPI: [name] is a 72 y.o. male with medical history significant of  DM2, HTN, HLD, CKDIIIb,prostate CA  who presents to ED with complaint of left eye vision changes described as blurry vision as well as difficulty speaking that started around 4:30  the day of presentation. Patient noted symptoms lasted for 10 minutes and has not recurred. He noted no prior symptoms like this in the past. He denies any associated symptoms of focal weakness, HA, swallowing difficulties , paresthesias or gait abnormalities.    ED Course:  On arrival to ED NIH 0 , code stroke called CTH negative . Patient was seen by tele neuro who recommended admission for tia/cva work up. Vitals:afeb p bp 152/74-169/125, hr 70 , rr 18 sat 97%  oin ra  Labs: Wbc 4.4, hgb 10.2 (11.6) plt 177  Inr 1  Etoh < 10  Na 141, K 4.4 , Glu 100, cr 2.46 (2.03) CTH: IMPRESSION: 1. No acute intracranial process.   [id number] rhythm UDS: neg UA: neg  Tx, asa 81 Review of Systems: As per HPI otherwise 10 point review of systems negative.   Past Medical History:  Diagnosis Date   CKD (chronic kidney disease)    Diabetes mellitus without complication ([place])    Hyperlipidemia    Hypertension    Insomnia    Prostate cancer [facility])     Past Surgical History:  Procedure Laterality Date   PROSTATE BIOPSY       reports that he has never smoked. He has never used smokeless tobacco. He reports that he does not drink alcohol and does not use drugs.  No Known Allergies  Family History  Problem Relation Age of Onset   Hypertension Mother    Colon cancer Mother    Diabetes Brother    Hypertension Daughter    Breast cancer Neg Hx    Pancreatic cancer Neg Hx     Prostate cancer Neg Hx     Prior to Admission medications   Medication Sig Start Date End Date Taking? Authorizing Provider  amLODipine (NORVASC) 10 MG tablet Take 1 tablet (10 mg total) by mouth daily. 09/04/22 08/30/23  Loel Dubonnet, NP  atorvastatin (LIPITOR) 40 MG tablet TAKE 1 TABLET BY MOUTH EVERY DAY 12/15/22   [name], MD  blood glucose meter kit and supplies Dispense based on patient and insurance preference. Use to check glucose daily (FOR ICD-10 E10.9, E11.9). 11/12/18   Luetta Nutting, DO  carvedilol (COREG CR) 80 MG 24 hr capsule Take 1 capsule (80 mg total) by mouth daily. 11/26/22   [name], MD  Cholecalciferol (VITAMIN D3) 50 MCG (2000 UT) TABS Take 1 tablet by mouth daily. Patient not taking: Reported on 01/14/2023 06/23/22   [provider]  Continuous Blood Gluc Receiver (FREESTYLE LIBRE 2 READER) DEVI 1 Device by Does not apply route as directed. Patient not taking: Reported on 01/14/2023 05/29/22   [name], MD  Continuous Blood Gluc Sensor (FREESTYLE LIBRE 3 SENSOR) MISC 1 Device by Does not apply route every 14 (fourteen) days. Place 1 sensor on the skin every 14 days. Use to check glucose continuously Patient not taking: Reported on 01/14/2023 05/29/22   [name], MD  dapagliflozin propanediol (FARXIGA) 5 MG TABS tablet Take 1 tablet (5 mg total) by mouth daily before breakfast. Patient not taking: Reported on 01/14/2023 05/29/22   [name], MD  doxazosin (CARDURA) 8 MG tablet Take 1 tablet (8 mg total) by mouth at bedtime. 11/03/22   Skeet Latch, MD  ferrous gluconate (FERGON) 324 MG tablet Take 1 tablet (324 mg total) by mouth daily with breakfast. 11/26/22   [name], MD  hydrALAZINE (APRESOLINE) 50 MG tablet Take 1 tablet (50 mg total) by mouth in the morning and at bedtime. 12/04/22   Loel Dubonnet, NP  Insulin Pen Needle (BD PEN NEEDLE NANO 2ND GEN) 32G X 4 MM MISC To use with insulin pen twice a day. 09/27/21    [name], MD  Lancet Devices (ONE TOUCH DELICA LANCING DEV) MISC 1 Device by Does not apply route as directed. 07/06/19   Shamleffer, Melanie Crazier, MD  latanoprost (XALATAN) 0.005 % ophthalmic solution SMARTSIG:In Eye(s) 11/07/20   [provider]  NOVOLIN 70/30 KWIKPEN (70-30) 100 UNIT/ML KwikPen INJECT 20 UNITS INTO THE SKIN DAILY BEFORE BREAKFAST AND 24 UNITS AT BEDTIME. 06/17/22   [name], MD  OneTouch Delica Lancets [id number] MISC 1 Device by Does not apply route as directed. Use to test up to 4x daily. 08/04/19   Luetta Nutting, DO  ONETOUCH ULTRA test strip USE UP TO 4X DAILY 07/03/20   Luetta Nutting, DO  ramelteon (ROZEREM) 8 MG tablet Take 1 tablet (8 mg) 30 minutes before bedtime. Patient not taking: Reported on 01/14/2023 08/18/22   [name], MD    Physical Exam: Vitals:   01/22/23 2030 01/22/23 2123 01/23/23 0009 01/23/23 0054  BP: (!) 175/153 (!) 134/100  (!) 157/83  Pulse: 76 76  75  Resp: 20 18  18   Temp:   97.6 F (36.4 C) 98.1 F (36.7 C)  TempSrc:   Oral Oral  SpO2: 97% 99%  98%  Weight:    69.9 kg  Height:    5\' 6"  (1.676 m)    Constitutional: NAD, calm, comfortable Vitals:   01/22/23 2030 01/22/23 2123 01/23/23 0009 01/23/23 0054  BP: (!) 175/153 (!) 134/100  (!) 157/83  Pulse: 76 76  75  Resp: 20 18  18   Temp:   97.6 F (36.4 C) 98.1 F (36.7 C)  TempSrc:   Oral Oral  SpO2: 97% 99%  98%  Weight:    69.9 kg  Height:    5\' 6"  (1.676 m)   Eyes: PERRL, lids and conjunctivae normal ENMT: Mucous membranes are moist. Posterior pharynx clear of any exudate or lesions.Normal dentition.  Neck: normal, supple, no masses, no thyromegaly Respiratory: clear to auscultation bilaterally, no wheezing, no crackles. Normal respiratory effort. No accessory muscle use.  Cardiovascular: Regular rate and rhythm, no murmurs / rubs / gallops. No extremity edema. 2+ pedal pulses. Abdomen: no tenderness, no masses palpated. No hepatosplenomegaly. Bowel  sounds positive.  Musculoskeletal: no clubbing / cyanosis. No joint deformity upper and lower extremities. Good ROM, no contractures. Normal muscle tone.  Skin: no rashes, lesions, ulcers. No induration Neurologic: CN 2-12 grossly intact. Sensation intact, Strength 5/5 in all 4.  Psychiatric: Normal judgment and insight. Alert and oriented x 3. Normal mood.    Labs on Admission: I have personally reviewed following labs and imaging studies  CBC: Recent Labs  Lab 01/22/23 1719  WBC 4.4  NEUTROABS 3.0  HGB 10.2*  HCT 28.9*  MCV 88.7  PLT [id number]   Basic Metabolic Panel: Recent Labs  Lab 01/22/23 1719  NA 141  K 4.4  CL 107  CO2 26  GLUCOSE 100*  BUN 39*  CREATININE 2.46*  CALCIUM 9.0   GFR: Estimated Creatinine Clearance: 25.2 mL/min (A) (by C-G formula based on SCr of 2.46 mg/dL (H)). Liver Function Tests: Recent Labs  Lab 01/22/23 1719  AST 30  ALT 13  ALKPHOS 61  BILITOT 0.3  PROT 6.1*  ALBUMIN 3.2*   No results for input(s): "LIPASE", "AMYLASE" in the last 168 hours. No results for input(s): "AMMONIA" in the last 168 hours. Coagulation Profile: Recent Labs  Lab 01/22/23 1719  INR 1.0   Cardiac Enzymes: No results for input(s): "CKTOTAL", "CKMB", "CKMBINDEX", "TROPONINI" in the last 168 hours. BNP (last 3 results) No results for input(s): "PROBNP" in the last 8760 hours. HbA1C: No results for input(s): "HGBA1C" in the last 72 hours. CBG: Recent Labs  Lab 01/22/23 1716  GLUCAP 74   Lipid Profile: No results for input(s): "CHOL", "HDL", "LDLCALC", "TRIG", "CHOLHDL", "LDLDIRECT" in the last 72 hours. Thyroid Function Tests: No results for input(s): "TSH", "T4TOTAL", "FREET4", "T3FREE", "THYROIDAB" in the last 72 hours. Anemia Panel: No results for input(s): "VITAMINB12", "FOLATE", "FERRITIN", "TIBC", "IRON", "RETICCTPCT" in the last 72 hours. Urine analysis:    Component Value Date/Time   COLORURINE YELLOW 01/22/2023 2121   APPEARANCEUR CLEAR  01/22/2023 2121   LABSPEC 1.017 01/22/2023 2121   PHURINE 6.0 01/22/2023 2121   GLUCOSEU NEGATIVE 01/22/2023 2121   GLUCOSEU NEGATIVE 04/10/2022 1014   HGBUR NEGATIVE 01/22/2023 2121   [place] NEGATIVE 01/22/2023 2121   KETONESUR NEGATIVE 01/22/2023 2121   PROTEINUR 30 (A) 01/22/2023 2121   UROBILINOGEN 0.2 04/10/2022 1014   NITRITE NEGATIVE 01/22/2023 2121   LEUKOCYTESUR NEGATIVE 01/22/2023 2121    Radiological Exams on Admission: CT HEAD CODE STROKE WO CONTRAST  Result Date: 01/22/2023 CLINICAL DATA:  Code stroke. Slurred speech and blurred vision, left-sided weakness EXAM: CT HEAD WITHOUT CONTRAST TECHNIQUE: Contiguous axial images were obtained from the base of the skull through the vertex without intravenous contrast. RADIATION DOSE REDUCTION: This exam was performed according to the departmental dose-optimization program which includes automated exposure control, adjustment of the mA and/or kV according to patient size and/or use of iterative reconstruction technique. COMPARISON:  None Available. FINDINGS: Brain: No evidence of acute infarction, hemorrhage, mass, mass effect, or midline shift. No hydrocephalus or extra-axial collection. Periventricular white matter changes, likely the sequela of chronic small vessel ischemic disease. Vascular: No hyperdense vessel. Atherosclerotic calcifications in the intracranial carotid and vertebral arteries. Skull: Negative for fracture or focal lesion. Sinuses/Orbits: No acute finding. Other: The mastoid air cells are well aerated. ASPECTS [facility] Stroke Program Early CT Score) - Ganglionic level infarction (caudate, lentiform nuclei, internal capsule, insula, M1-M3 cortex): 7 - Supraganglionic infarction (M4-M6 cortex): 3 Total score (0-10 with 10 being normal): 10 IMPRESSION: 1. No acute intracranial process. 2. ASPECTS is 10 Code stroke imaging results were communicated on 01/22/2023 at 5:27 pm to provider [facility] via telephone, who verbally  acknowledged these results. Electronically Signed   By: Merilyn Baba M.D.   On: 01/22/2023 17:28    EKG: Independently reviewed. See above  Assessment/Plan TIA r/o CVA -slurred speech vision changes  -symptoms  now resolved   - CT head negative for acute finding , neuro exam non focal   -admit tia/cva r/o  -mri pending , carotid pending -per neurology recs ASA 81mg  daily + plavix 75mg   daily x21 days f/b ASA 81mg  daily monotherapy after that  -neuro checks  q4h, SLP, PT/OT  -echo per protocol   AKI on CKDIIIb - hold nephrotoxic meds -gently ivfs  - monitor labs   DM2 -iss/fs     HTN -permissive hypertension x 48 hours or r/o cva r/o by MRI -Prn labetalol  - hold oral anti- htn  per neuro recs  -prn for BP >220/110   Prostate CA -no active issues    DVT prophylaxis: heparin  Code Status: full/ as discussed per patient wishes in event of cardiac arrest  Family Communication: none at bedside Disposition Plan: patient  expected to be admitted less than 2 midnights  Consults called: DR Quinn Axe tele neurology  Admission status: progressive    Clance Boll MD [facility]   If 7PM-7AM, please contact night-coverage [email or web address] Password [facility]  01/23/2023, 1:54 AM

## 2023-01-23 NOTE — Progress Notes (Signed)
Per Marella Chimes, sonographer, patient refused exam. Attempted at 9:45am.

## 2023-01-23 NOTE — Evaluation (Signed)
Occupational Therapy Evaluation Patient Details Name: [name] MRN: [id number] DOB: Aug 08, 1951 Today's Date: 01/23/2023   History of Present Illness Pt is a 72 y.o. M who presents 01/22/2023 with blurry vision and difficulty speaking. Pt noted symptoms lasted for 10 minutes and has not recurred. CTH and MRI negative. Significant PMH: DM2, HTN, HLD, CKD IIIB, prostate CA.   Clinical Impression   Pt is at baseline Ind level of function with ADLs and ADL mobility and no visual, GMC, FMC or cognitive impairments demonstrated at this time. All education completed and no further acute OT services are indicated at this time.       Recommendations for follow up therapy are one component of a multi-disciplinary discharge planning process, led by the attending physician.  Recommendations may be updated based on patient status, additional functional criteria and insurance authorization.   Assistance Recommended at Discharge None  Patient can return home with the following      Functional Status Assessment  Patient has not had a recent decline in their functional status  Equipment Recommendations  None recommended by OT    Recommendations for Other Services       Precautions / Restrictions Precautions Precautions: None Restrictions Weight Bearing Restrictions: No      Mobility Bed Mobility Overal bed mobility: Independent                  Transfers Overall transfer level: Independent Equipment used: None                      Balance Overall balance assessment: No apparent balance deficits (not formally assessed)                                         ADL either performed or assessed with clinical judgement   ADL Overall ADL's : Independent;At baseline                                             Vision Patient Visual Report: No change from baseline       Perception     Praxis      Pertinent Vitals/Pain Pain  Assessment Pain Assessment: No/denies pain     Hand Dominance Right   Extremity/Trunk Assessment Upper Extremity Assessment Upper Extremity Assessment: Overall WFL for tasks assessed   Lower Extremity Assessment Lower Extremity Assessment: Defer to PT evaluation   Cervical / Trunk Assessment Cervical / Trunk Assessment: Normal   Communication Communication Communication: No difficulties   Cognition Arousal/Alertness: Awake/alert Behavior During Therapy: WFL for tasks assessed/performed Overall Cognitive Status: Within Functional Limits for tasks assessed                                       General Comments       Exercises     Shoulder Instructions      Home Living Family/patient expects to be discharged to:: Private residence Living Arrangements: Alone   Type of Home: House Home Access: Level entry     Home Layout: One level     Bathroom Shower/Tub: Tub/shower unit;Walk-in shower   Bathroom Toilet: Standard     Home Equipment: None  Prior Functioning/Environment Prior Level of Function : Independent/Modified Independent;Working/employed             Mobility Comments: self employed; works in home improvement ADLs Comments: Ind with ADL, IADLs, home mgt, was driving        OT Problem List: Decreased activity tolerance      OT Treatment/Interventions:      OT Goals(Current goals can be found in the care plan section) Acute Rehab OT Goals Patient Stated Goal: go home today  OT Frequency:      Co-evaluation              AM-PAC OT "6 Clicks" Daily Activity     Outcome Measure Help from another person eating meals?: None Help from another person taking care of personal grooming?: None Help from another person toileting, which includes using toliet, bedpan, or urinal?: None Help from another person bathing (including washing, rinsing, drying)?: None Help from another person to put on and taking off regular  upper body clothing?: None Help from another person to put on and taking off regular lower body clothing?: None 6 Click Score: 24   End of Session    Activity Tolerance: Patient tolerated treatment well Patient left: in bed;with call bell/phone within reach  OT Visit Diagnosis: Other symptoms and signs involving the nervous system [id number])                [id number] OT Time Calculation (min): 16 min Charges:  OT General Charges $OT Visit: 1 Visit OT Evaluation $OT Eval Low Complexity: 1 Low    Britt Bottom 01/23/2023, 12:41 PM

## 2023-01-23 NOTE — Progress Notes (Signed)
Carotid duplex and TCD attempted. Patient with Education officer, museum when attempted. Attempted to return minutes later, and patient was being walked out for discharge home by RN.   Darlin Coco, RDMS, RVT

## 2023-01-23 NOTE — Hospital Course (Signed)
Patient is a 72 year old male with past medical history of hypertension, diabetes mellitus type 2, secondary retinopathy, prostate CA and stage IIIb CKD who presented to the emergency room on the evening of 3/28 with sudden onset dysarthria and blurred vision of the left eye.  Soon after arrival to the emergency room, symptoms resolved.  Patient evaluated by neurology brought in for further evaluation for TIA.  Following admission, MRI negative for acute CVA.

## 2023-01-23 NOTE — Assessment & Plan Note (Signed)
As symptoms resolved and patient felt better, he did not want any additional workup.  He declined carotid Dopplers and echocardiogram.  He declined PT and swallow eval said he is feeling fine.  He was somewhat skeptical about having a TIA, but was willing to take medications accordingly.  Likely small vessel disease.  Have added aspirin 81 mg p.o. daily plus Plavix 75 mg p.o. daily x 3 weeks.  Looking at risk factors, certainly controlled blood pressure, blood sugars and lipids were all needed.  Had extensive discussion with patient.  Clarified his recently adjusted regimen for blood pressure.  Patient states compliance with Lipitor up until 1 week ago when he ran out.  LDL at 143 with target below 70.  Have increased Lipitor from 40 mg to 80 mg.  Patient states overall his sugars are decently well-controlled.

## 2023-01-23 NOTE — Progress Notes (Signed)
SLP Cancellation Note  Patient Details Name: [name] MRN: [id number] DOB: [birth year]   Cancelled treatment:       Reason Eval/Treat Not Completed: SLP screened, no needs identified, will sign off.   Chart reviewed - note that MRI was negative and PT reports no cognitive concerns during their evaluation. SLP touched base with pt who reports that all of his symptoms have completely resolved and he would like to defer SLP testing at this time. SLP therefore will sign off. Please reorder with any concerns.     Osie Bond., M.A. [place] Office [phone]  Secure chat preferred  01/23/2023, 9:29 AM

## 2023-01-23 NOTE — TOC Transition Note (Addendum)
Transition of Care [facility]) - CM/SW Discharge Note   Patient Details  Name: [name] MRN: [id number] Date of Birth: [birth year]  Transition of Care [facility]) CM/SW Contact:  Pollie Friar, RN Phone Number: 01/23/2023, 10:50 AM   Clinical Narrative:    Pt is from home alone.  No f/u per PT.  Pt has transportation home.  Pt seen for issues with water bill. Referral placed in [facility] with pt permission.   Final next level of care: Home/Self Care Barriers to Discharge: No Barriers Identified   Patient Goals and CMS Choice      Discharge Placement                         Discharge Plan and Services Additional resources added to the After Visit Summary for                                       Social Determinants of Health (SDOH) Interventions SDOH Screenings   Food Insecurity: No Food Insecurity (01/23/2023)  Housing: [place]  (01/23/2023)  Transportation Needs: No Transportation Needs (01/23/2023)  Utilities: At Risk (01/23/2023)  Alcohol Screen: Low Risk  (07/03/2022)  Depression (PHQ2-9): Low Risk  (01/07/2023)  Financial Resource Strain: Low Risk  (09/04/2022)  Physical Activity: Inactive (05/05/2022)  Social Connections: Socially Isolated (04/17/2021)  Stress: No Stress Concern Present (05/05/2022)  Tobacco Use: Low Risk  (01/22/2023)     Readmission Risk Interventions     No data to display

## 2023-01-29 ENCOUNTER — Encounter (HOSPITAL_BASED_OUTPATIENT_CLINIC_OR_DEPARTMENT_OTHER): Payer: Self-pay

## 2023-02-08 ENCOUNTER — Encounter (HOSPITAL_COMMUNITY): Payer: Self-pay

## 2023-02-08 ENCOUNTER — Emergency Department (HOSPITAL_COMMUNITY)
Admission: EM | Admit: 2023-02-08 | Discharge: 2023-02-08 | Disposition: A | Discharge status: Home / Self Care | Payer: Medicare HMO | Attending: Emergency Medicine | Admitting: Emergency Medicine

## 2023-02-08 ENCOUNTER — Emergency Department (HOSPITAL_COMMUNITY): Payer: Medicare HMO

## 2023-02-08 DIAGNOSIS — R410 Disorientation, unspecified: Secondary | ICD-10-CM | POA: Diagnosis not present

## 2023-02-08 DIAGNOSIS — E162 Hypoglycemia, unspecified: Secondary | ICD-10-CM | POA: Diagnosis not present

## 2023-02-08 DIAGNOSIS — E161 Other hypoglycemia: Secondary | ICD-10-CM | POA: Diagnosis not present

## 2023-02-08 DIAGNOSIS — Z7902 Long term (current) use of antithrombotics/antiplatelets: Secondary | ICD-10-CM | POA: Insufficient documentation

## 2023-02-08 DIAGNOSIS — N189 Chronic kidney disease, unspecified: Secondary | ICD-10-CM | POA: Insufficient documentation

## 2023-02-08 DIAGNOSIS — Z7982 Long term (current) use of aspirin: Secondary | ICD-10-CM | POA: Insufficient documentation

## 2023-02-08 DIAGNOSIS — E1122 Type 2 diabetes mellitus with diabetic chronic kidney disease: Secondary | ICD-10-CM | POA: Insufficient documentation

## 2023-02-08 DIAGNOSIS — Z0389 Encounter for observation for other suspected diseases and conditions ruled out: Secondary | ICD-10-CM | POA: Diagnosis not present

## 2023-02-08 DIAGNOSIS — Z743 Need for continuous supervision: Secondary | ICD-10-CM | POA: Diagnosis not present

## 2023-02-08 DIAGNOSIS — Z8546 Personal history of malignant neoplasm of prostate: Secondary | ICD-10-CM | POA: Insufficient documentation

## 2023-02-08 DIAGNOSIS — Z794 Long term (current) use of insulin: Secondary | ICD-10-CM | POA: Insufficient documentation

## 2023-02-08 DIAGNOSIS — I129 Hypertensive chronic kidney disease with stage 1 through stage 4 chronic kidney disease, or unspecified chronic kidney disease: Secondary | ICD-10-CM | POA: Diagnosis not present

## 2023-02-08 DIAGNOSIS — E11649 Type 2 diabetes mellitus with hypoglycemia without coma: Secondary | ICD-10-CM | POA: Insufficient documentation

## 2023-02-08 DIAGNOSIS — I10 Essential (primary) hypertension: Secondary | ICD-10-CM | POA: Diagnosis not present

## 2023-02-08 DIAGNOSIS — Z79899 Other long term (current) drug therapy: Secondary | ICD-10-CM | POA: Insufficient documentation

## 2023-02-08 LAB — URINALYSIS, W/ REFLEX TO CULTURE (INFECTION SUSPECTED)
Bacteria, UA: NONE SEEN
Bilirubin Urine: NEGATIVE
Glucose, UA: NEGATIVE mg/dL
Ketones, ur: NEGATIVE mg/dL
Leukocytes,Ua: NEGATIVE
Nitrite: NEGATIVE
Protein, ur: >=300 mg/dL — AB
Specific Gravity, Urine: 1.017 (ref 1.005–1.030)
pH: 5 (ref 5.0–8.0)

## 2023-02-08 LAB — COMPREHENSIVE METABOLIC PANEL
ALT: 21 U/L (ref 0–44)
AST: 26 U/L (ref 15–41)
Albumin: 3.7 g/dL (ref 3.5–5.0)
Alkaline Phosphatase: 66 U/L (ref 38–126)
Anion gap: 10 (ref 5–15)
BUN: 34 mg/dL — ABNORMAL HIGH (ref 8–23)
CO2: 25 mmol/L (ref 22–32)
Calcium: 9.2 mg/dL (ref 8.9–10.3)
Chloride: 107 mmol/L (ref 98–111)
Creatinine, Ser: 2.14 mg/dL — ABNORMAL HIGH (ref 0.61–1.24)
GFR, Estimated: 32 mL/min — ABNORMAL LOW (ref >60)
Glucose, Bld: 90 mg/dL (ref 70–99)
Potassium: 3.9 mmol/L (ref 3.5–5.1)
Sodium: 142 mmol/L (ref 135–145)
Total Bilirubin: 0.8 mg/dL (ref 0.3–1.2)
Total Protein: 7.7 g/dL (ref 6.5–8.1)

## 2023-02-08 LAB — CBC WITH DIFFERENTIAL/PLATELET
Abs Immature Granulocytes: 0.02 10*3/uL (ref 0.00–0.07)
Basophils Absolute: 0 10*3/uL (ref 0.0–0.1)
Basophils Relative: 0 %
Eosinophils Absolute: 0.1 10*3/uL (ref 0.0–0.5)
Eosinophils Relative: 1 %
HCT: 32.5 % — ABNORMAL LOW (ref 39.0–52.0)
Hemoglobin: 11.2 g/dL — ABNORMAL LOW (ref 13.0–17.0)
Immature Granulocytes: 0 %
Lymphocytes Relative: 11 %
Lymphs Abs: 0.7 10*3/uL (ref 0.7–4.0)
MCH: 31 pg (ref 26.0–34.0)
MCHC: 34.5 g/dL (ref 30.0–36.0)
MCV: 90 fL (ref 80.0–100.0)
Monocytes Absolute: 0.4 10*3/uL (ref 0.1–1.0)
Monocytes Relative: 6 %
Neutro Abs: 4.8 10*3/uL (ref 1.7–7.7)
Neutrophils Relative %: 82 %
Platelets: 224 10*3/uL (ref 150–400)
RBC: 3.61 MIL/uL — ABNORMAL LOW (ref 4.22–5.81)
RDW: 12.5 % (ref 11.5–15.5)
WBC: 5.9 10*3/uL (ref 4.0–10.5)
nRBC: 0 % (ref 0.0–0.2)

## 2023-02-08 LAB — PROTIME-INR
INR: 1 (ref 0.8–1.2)
Prothrombin Time: 12.7 seconds (ref 11.4–15.2)

## 2023-02-08 LAB — CBG MONITORING, ED
Glucose-Capillary: 130 mg/dL — ABNORMAL HIGH (ref 70–99)
Glucose-Capillary: 109 mg/dL — ABNORMAL HIGH (ref 70–99)
Glucose-Capillary: 152 mg/dL — ABNORMAL HIGH (ref 70–99)

## 2023-02-08 LAB — LACTIC ACID, PLASMA
Lactic Acid, Venous: 1.5 mmol/L (ref 0.5–1.9)
Lactic Acid, Venous: 0.7 mmol/L (ref 0.5–1.9)

## 2023-02-08 LAB — TROPONIN I (HIGH SENSITIVITY)
Troponin I (High Sensitivity): 15 ng/L (ref <18)
Troponin I (High Sensitivity): 12 ng/L (ref <18)

## 2023-02-08 LAB — MAGNESIUM: Magnesium: 2.1 mg/dL (ref 1.7–2.4)

## 2023-02-08 MED ORDER — LACTATED RINGERS IV BOLUS (SEPSIS)
1000.0000 mL | Freq: Once | INTRAVENOUS | Status: AC
  Administered 2023-02-08: 1000 mL via INTRAVENOUS

## 2023-02-08 NOTE — Discharge Instructions (Signed)
Avoid missed meals.  Follow-up with your primary care doctor.  Return to the emergency department for any new or worsening symptoms of concern.

## 2023-02-08 NOTE — ED Notes (Signed)
Patient attempted urine sample. Unsuccessful.  °

## 2023-02-08 NOTE — ED Provider Notes (Signed)
[place] EMERGENCY DEPARTMENT AT [facility] Provider Note   CSN: [id number] Arrival date & time: 02/08/23  1742     History  Chief Complaint  Patient presents with   Hypoglycemia    [name] is a 72 y.o. male.   Hypoglycemia Associated symptoms: sweats   Patient presents for altered mental status.  Medical history includes DM, CKD, HLD, HTN, prostate CA, depression.  Patient reports that he was in his normal state of health yesterday.  He remodeled his bathroom.  He states that he was feeling fine this morning.  At approximately 1 PM, he took 22 units of his 70/30 insulin.  He took his pills.  He was planning on eating lunch but did not end up eating anything because he became altered at home.  Significant other called EMS.  When EMS arrived on scene, blood sugar was in the range of 40.  He was provided with D10 prior to arrival.  On arrival, patient denies any current symptoms.     Home Medications Prior to Admission medications   Medication Sig Start Date End Date Taking? Authorizing Provider  amLODipine (NORVASC) 10 MG tablet Take 1 tablet (10 mg total) by mouth daily. 09/04/22 08/30/23  Alver Sorrow, NP  aspirin EC 81 MG tablet Take 1 tablet (81 mg total) by mouth daily. Swallow whole. 01/24/23   Hollice Espy, MD  atorvastatin (LIPITOR) 80 MG tablet Take 1 tablet (80 mg total) by mouth daily. 01/23/23   Hollice Espy, MD  blood glucose meter kit and supplies Dispense based on patient and insurance preference. Use to check glucose daily (FOR ICD-10 E10.9, E11.9). 11/12/18   Everrett Coombe, DO  carvedilol (COREG CR) 80 MG 24 hr capsule Take 1 capsule (80 mg total) by mouth daily. 11/26/22   Deeann Saint, MD  clopidogrel (PLAVIX) 75 MG tablet Take 1 tablet (75 mg total) by mouth daily. 01/24/23   Hollice Espy, MD  doxazosin (CARDURA) 8 MG tablet Take 1 tablet (8 mg total) by mouth at bedtime. 11/03/22   Chilton Si, MD  ferrous gluconate  (FERGON) 324 MG tablet Take 1 tablet (324 mg total) by mouth daily with breakfast. 11/26/22   Deeann Saint, MD  hydrALAZINE (APRESOLINE) 50 MG tablet Take 1 tablet (50 mg total) by mouth in the morning and at bedtime. 12/04/22   Alver Sorrow, NP  Insulin Pen Needle (BD PEN NEEDLE NANO 2ND GEN) 32G X 4 MM MISC To use with insulin pen twice a day. 09/27/21   Deeann Saint, MD  Lancet Devices (ONE TOUCH DELICA LANCING DEV) MISC 1 Device by Does not apply route as directed. 07/06/19   Shamleffer, Konrad Dolores, MD  latanoprost (XALATAN) 0.005 % ophthalmic solution SMARTSIG:In Eye(s) 11/07/20   [provider]  NOVOLIN 70/30 KWIKPEN (70-30) 100 UNIT/ML KwikPen INJECT 20 UNITS INTO THE SKIN DAILY BEFORE BREAKFAST AND 24 UNITS AT BEDTIME. 06/17/22   Deeann Saint, MD  OneTouch Delica Lancets 30G MISC 1 Device by Does not apply route as directed. Use to test up to 4x daily. 08/04/19   Everrett Coombe, DO  ONETOUCH ULTRA test strip USE UP TO 4X DAILY 07/03/20   Everrett Coombe, DO      Allergies    Patient has no known allergies.    Review of Systems   Review of Systems  Constitutional:  Positive for diaphoresis.  Psychiatric/Behavioral:  Positive for confusion.   All other systems reviewed and  are negative.   Physical Exam Updated Vital Signs BP (!) 194/98   Pulse 89   Temp (!) 97.5 F (36.4 C) (Oral)   Resp 19   Ht  (1.676 m)   Wt 70 kg   SpO2 96%   BMI 24.91 kg/m  Physical Exam Vitals and nursing note reviewed.  Constitutional:      General: He is not in acute distress.    Appearance: Normal appearance. He is well-developed. He is not ill-appearing, toxic-appearing or diaphoretic.  HENT:     Head: Normocephalic and atraumatic.     Right Ear: External ear normal.     Left Ear: External ear normal.     Nose: Nose normal.     Mouth/Throat:     Mouth: Mucous membranes are moist.  Eyes:     Extraocular Movements: Extraocular movements intact.      Conjunctiva/sclera: Conjunctivae normal.  Cardiovascular:     Rate and Rhythm: Normal rate and regular rhythm.  Pulmonary:     Effort: Pulmonary effort is normal. No respiratory distress.  Abdominal:     General: There is no distension.     Palpations: Abdomen is soft.     Tenderness: There is no abdominal tenderness.  Musculoskeletal:        General: No swelling. Normal range of motion.     Cervical back: Normal range of motion and neck supple.     Right lower leg: No edema.     Left lower leg: No edema.  Skin:    General: Skin is warm and dry.     Coloration: Skin is not jaundiced or pale.  Neurological:     General: No focal deficit present.     Mental Status: He is alert and oriented to person, place, and time.  Psychiatric:        Mood and Affect: Mood normal.        Behavior: Behavior normal.        Thought Content: Thought content normal.        Judgment: Judgment normal.     ED Results / Procedures / Treatments   Labs (all labs ordered are listed, but only abnormal results are displayed) Labs Reviewed  COMPREHENSIVE METABOLIC PANEL - Abnormal; Notable for the following components:      Result Value   BUN 34 (*)    Creatinine, Ser 2.14 (*)    GFR, Estimated 32 (*)    All other components within normal limits  CBC WITH DIFFERENTIAL/PLATELET - Abnormal; Notable for the following components:   RBC 3.61 (*)    Hemoglobin 11.2 (*)    HCT 32.5 (*)    All other components within normal limits  URINALYSIS, W/ REFLEX TO CULTURE (INFECTION SUSPECTED) - Abnormal; Notable for the following components:   Hgb urine dipstick SMALL (*)    Protein, ur >=300 (*)    All other components within normal limits  CBG MONITORING, ED - Abnormal; Notable for the following components:   Glucose-Capillary 130 (*)    All other components within normal limits  CBG MONITORING, ED - Abnormal; Notable for the following components:   Glucose-Capillary 109 (*)    All other components within  normal limits  CBG MONITORING, ED - Abnormal; Notable for the following components:   Glucose-Capillary 152 (*)    All other components within normal limits  CULTURE, BLOOD (ROUTINE X 2)  CULTURE, BLOOD (ROUTINE X 2)  LACTIC ACID, PLASMA  LACTIC ACID, PLASMA  PROTIME-INR  MAGNESIUM  TROPONIN I (HIGH SENSITIVITY)  TROPONIN I (HIGH SENSITIVITY)    EKG EKG Interpretation  Date/Time:  Sunday February 08 2023 18:05:01 EDT Ventricular Rate:  66 PR Interval:  159 QRS Duration: 109 QT Interval:  456 QTC Calculation: 478 R Axis:   76 Text Interpretation: Sinus rhythm Probable left ventricular hypertrophy Borderline prolonged QT interval Confirmed by Gloris Manchester [phone]) on 02/08/2023 6:07:28 PM  Radiology DG Chest Port 1 View  Result Date: 02/08/2023 CLINICAL DATA:  Questionable sepsis - evaluate for abnormality EXAM: PORTABLE CHEST 1 VIEW COMPARISON:  11/01/2018 FINDINGS: The heart is normal in size for technique.The cardiomediastinal contours are normal. The lungs are clear. Pulmonary vasculature is normal. No consolidation, pleural effusion, or pneumothorax. No acute osseous abnormalities are seen. IMPRESSION: No acute chest findings. Electronically Signed   By: Narda Rutherford M.D.   On: 02/08/2023 18:28    Procedures Procedures    Medications Ordered in ED Medications  lactated ringers bolus 1,000 mL (0 mLs Intravenous Stopped 02/08/23 1836)    ED Course/ Medical Decision Making/ A&P                             Medical Decision Making Amount and/or Complexity of Data Reviewed Labs: ordered. Radiology: ordered. ECG/medicine tests: ordered.   This patient presents to the ED for concern of hypoglycemia, this involves an extensive number of treatment options, and is a complaint that carries with it a high risk of complications and morbidity.  The differential diagnosis includes excess insulin, decreased p.o. intake, infection, adrenal insufficiency, ACS   Co morbidities that  complicate the patient evaluation  DM, CKD, HLD, HTN, prostate CA, depression   Additional history obtained:  Additional history obtained from N/A External records from outside source obtained and reviewed including EMR   Lab Tests:  I Ordered, and personally interpreted labs.  The pertinent results include: Baseline creatinine, normal electrolytes, baseline anemia, no leukocytosis, normal lactate, normal troponin, sustained euglycemia   Imaging Studies ordered:  I ordered imaging studies including chest x-ray I independently visualized and interpreted imaging which showed no acute findings I agree with the radiologist interpretation   Cardiac Monitoring: / EKG:  The patient was maintained on a cardiac monitor.  I personally viewed and interpreted the cardiac monitored which showed an underlying rhythm of: Sinus rhythm   Problem List / ED Course / Critical interventions / Medication management  Patient presents for altered mental status.  Found to be hypoglycemic on scene with EMS.  He was provided with oral glucose replacement and D10 prior to arrival.  Sugar on arrival was 130.  On exam, patient is well-appearing.  He is alert and oriented.  He denies any current symptoms.  On check of rectal temperature, patient was found to be hypothermic at 94.9 degrees.  Warming blanket was applied.  Septic workup was initiated.  Lab work results are all reassuring.  Patient had sustained normalization of blood glucose and temperature.  He remained asymptomatic while in the ED.  He declined any food stating that he would prefer to eat at home.  He does request discharge home at this time.  I suspect his episode of hypoglycemia was secondary to taking his insulin without eating.  I suspect that his hypothermia was secondary to his hypoglycemia.  Given his sustained resolution of symptoms, sugar, and vital signs, in addition to his reassuring workup, I feel that discharge is appropriate.  Patient  was  able to ambulate in the ED.  He was discharged in stable condition. I ordered medication including IV fluids for hydration Reevaluation of the patient after these medicines showed that the patient improved I have reviewed the patients home medicines and have made adjustments as needed   Social Determinants of Health:  Has PCP         Final Clinical Impression(s) / ED Diagnoses Final diagnoses:  Hypoglycemia    Rx / DC Orders ED Discharge Orders     None         Gloris Manchester, MD 02/08/23 2220

## 2023-02-08 NOTE — ED Triage Notes (Addendum)
Patient BIB [facility] from home. Patients family called because he became lethargic and altered. On scene, patients sugar was 41. EMS attempted to give patient food/drink but sugar continued to come up to 70 and then dropped again to 50. EMS started D10 infusion IV.  EMS IV 20G left AC  On arrival CBG 130

## 2023-02-08 NOTE — ED Notes (Signed)
Bair hugger applied to patient.

## 2023-02-09 ENCOUNTER — Ambulatory Visit: Payer: Medicare HMO

## 2023-02-09 ENCOUNTER — Telehealth: Payer: Self-pay

## 2023-02-09 ENCOUNTER — Telehealth: Payer: Self-pay | Admitting: Cardiovascular Disease

## 2023-02-09 DIAGNOSIS — I10 Essential (primary) hypertension: Secondary | ICD-10-CM

## 2023-02-09 DIAGNOSIS — R42 Dizziness and giddiness: Secondary | ICD-10-CM

## 2023-02-09 DIAGNOSIS — E1169 Type 2 diabetes mellitus with other specified complication: Secondary | ICD-10-CM

## 2023-02-09 DIAGNOSIS — D509 Iron deficiency anemia, unspecified: Secondary | ICD-10-CM

## 2023-02-09 DIAGNOSIS — E1165 Type 2 diabetes mellitus with hyperglycemia: Secondary | ICD-10-CM

## 2023-02-09 NOTE — Progress Notes (Signed)
  Care Management & Coordination Services Pharmacy Note  02/09/2023 Name:  [name] MRN:  [id number] DOB:  September 20, 1951  Summary: Pt taking TWO beta blockers, HCTZ which had previously been d/c, outdated dose of doxazosin and amlodipine. Reports taking hydralazine "based on how he feels that day, typically 50mg  twice a day" BP 112/61 HR 71 today in left arm ED visit yesterday for hypoglycemia, attributed to irregular meal patterns. Dexcom sensor was old and had not been replaced.  Patient consents to med sync packaging and is in great need of med assistance, will discuss with PCP   Recommendations/Changes made from today's visit: STOP Metoprolol and HCTZ as previously instructed INCREASE Amlodipine to 10mg  as previously instructed CONTINUE Carvedilol 25mg  BID and Doxazosin 4mg  at bedtime CONTINUE Hydralazine 50mg  BID CHECK BP TWICE DAILY, along with HR, keep a detailed log of timing, med admin REPLACED Dexcom sensor and repeated education on use and when to change  Follow up plan: 10 days for BP log, to remind patient to change dexcom sensor, Pharmacist visit in 1 month   Subjective: [name] is an 72 y.o. year old male who is a primary patient of Deeann Saint, MD.  The care coordination team was consulted for assistance with disease management and care coordination needs.    Engaged with patient face to face for follow up visit. Focused follow up on BP and DM  Assessment/Plan  Hypertension:  Patient comes in with following BP medications and reports taking as of TODAY:  - Metoprolol Tartrate 25mg  1/2 tab BID (two bottles, patient not sure if he took two doses) -Hydrochlorothiazide 25mg  1 tab daily -Carvedilol 25mg  1 tab BID -Hydralazine 100mg  (directions say 1 tab TID-patient reports 1/2 tab one to two times daily) -Amlodipine 5mg  one tab daily -Doxasozin 4mg  1 tab at bedtime  BP in office today is 112/61 HR 71 in left arm; However, medication regimen patient  is following (not as prescribed) currently includes TWO beta blockers.   Instructed patient to make the following changes, as previously instructed:  STOP Metoprolol and HCTZ, INCREASE Amlodipine 5mg  to 10mg  once daily CONTINUE Carvedilol 25mg  BID, Doxazosin 4mg  qhs, and Hydralazine 50mg  BID  CHECK BP/HR TWICE DAILY AND KEEP A DETAILED LOG (time of day checked, what meds you have taken for the day and at what time)  - Follow-Up in 10 days for a log  Patient becomes easily confused and does not recall what medications he is taking and admits to taking "whatever [facility] fills for him." Had previously agreed to enroll in [facility] but there was a delay in receiving prescriptions and patient was hospitalized. Will make PCP aware and urgently request RX to be sent for all medications.  OF NOTE: Patient has only 13 doses of plavix left on hand, with f/u with cardio not scheduled until June. Will coordinate with PCP about potential of refilling if needed   Diabetes: Patient went to ED yesterday for a low but admits to irregular eating patterns. Has stopped farxiga 5mg  per hospital discharge summary. Was using Dexcom Sensor but forgot to replace after 10 days. New sensor applied in office and will establish reminder calls to switch.    Sherrill Raring Clinical Pharmacist [phone]

## 2023-02-09 NOTE — Telephone Encounter (Signed)
Ok to [facility], [facility] day supply with 3 refills.

## 2023-02-09 NOTE — Telephone Encounter (Signed)
[facility] requesting new prescriptions for the patient's medications so that he can begin enrollment in med packaging and delivery:  Amlodipine 10mg  once daily Aspirin 81mg  EC once daily Atorvastatin 80mg  once daily Carvedilol 25mg  BID Doxazosin 4mg  once daily Ferrous Gluconate 324mg  once daily Hydralazine 50mg  BID Novolin 70/30 Kwikpen 20 units at breakfast, 24 at bedtime  Dexcom G7 Sensors (samples started in office)  Pharmacy Info: [facility] - [place], [place] - [address] Phone: [phone]  Fax: [phone]      Thank you! Sherrill Raring Clinical Pharmacist [phone]

## 2023-02-09 NOTE — Telephone Encounter (Signed)
Called patient to find out which dose he has been taking, no answer, left message for him to call back to verify dose before sending new RX to pharm

## 2023-02-09 NOTE — Telephone Encounter (Signed)
Patient returned call to the office,   Patient states he thinks he has been taking Doxazocin 4mg . He does not have his blood pressures readings on him. Rn asked patient to call us back when he gets home with those blood pressure readings.   Per NP, patient has had two hospitalizations and an ED visit probably best to bring him in for an appointment.   If patient does not call back by tomorrow we will call him back and schedule him.

## 2023-02-09 NOTE — Telephone Encounter (Signed)
Pt c/o medication issue:  1. Name of Medication: doxazosin (CARDURA) 8 MG tablet   2. How are you currently taking this medication (dosage and times per day)? Take 1 tablet (8 mg total) by mouth at bedtime.   3. Are you having a reaction (difficulty breathing--STAT)? No   4. What is your medication issue? Pharmacy called and said that patient only takes 4 mg of medication. Please send in new 3 month, 90 days prescription of  doxazosin (CARDURA) 4 MG tablet to [facility] - [place], [place] - [address]

## 2023-02-10 ENCOUNTER — Telehealth: Payer: Self-pay

## 2023-02-10 MED ORDER — DOXAZOSIN MESYLATE 8 MG PO TABS
8.0000 mg | ORAL_TABLET | Freq: Every day | ORAL | 6 refills | Status: DC
Start: 2023-02-10 — End: 2023-02-11

## 2023-02-10 NOTE — Addendum Note (Signed)
Addended by: Marlene Lard on: 02/10/2023 04:45 PM   Modules accepted: Orders

## 2023-02-10 NOTE — Telephone Encounter (Signed)
     Patient  visit on 4/14  at [facility]    Have you been able to follow up with your primary care physician? Yes   The patient was or was not able to obtain any needed medicine or equipment. Yes   Are there diet recommendations that you are having difficulty following?na   Patient expresses understanding of discharge instructions and education provided has no other needs at this time. Yes       Lenard Forth [facility] [place], [facility] [phone] [address] [place], [place], [place] [address] Phone: [phone] Email: [email or web address]

## 2023-02-10 NOTE — Telephone Encounter (Signed)
Returned call to patient, reviewed the following recommendations with the patient. He is agreeable to the change, rx sent to pharm, reminder set to call patient in two weeks.     "Admitted 3/28-3/29/24 with TIA.  Thought to be related to small vessel disease.  Important to have BP adequately controlled. ED visit 02/08/23 hypoglycemia in setting of taking insulin and not eating lunch.    Recommend increase Doxazosin to 8mg  QHS as prescribed. Recommend he call us in 1-2 weeks to report BP readings.    Okay to leave OV as is and we can reassess moving visit earlier based on those BP readings.    Alver Sorrow, NP"

## 2023-02-10 NOTE — Telephone Encounter (Signed)
Returned call to patient,   He states the following are his blood pressures on Doxazosin 4mg . He thinks most of the readings are after his medication .   160/80 145/79 195/79   Patient is currently taking Doxazosin 4mg . Offered patient appointment Thursday but he has another conflicting appointment.   Routing to [employer], [employer] for review.

## 2023-02-10 NOTE — Telephone Encounter (Signed)
Admitted 3/28-3/29/24 with TIA.  Thought to be related to small vessel disease.  Important to have BP adequately controlled. ED visit 02/08/23 hypoglycemia in setting of taking insulin and not eating lunch.   Recommend increase Doxazosin to 8mg  QHS as prescribed. Recommend he call us in 1-2 weeks to report BP readings.   Okay to leave OV as is and we can reassess moving visit earlier based on those BP readings.   Alver Sorrow, NP

## 2023-02-11 MED ORDER — FERROUS GLUCONATE 324 (38 FE) MG PO TABS
324.0000 mg | ORAL_TABLET | Freq: Every day | ORAL | 3 refills | Status: DC
Start: 2023-02-11 — End: 2024-01-25

## 2023-02-11 MED ORDER — NOVOLIN 70/30 FLEXPEN (70-30) 100 UNIT/ML ~~LOC~~ SUPN
PEN_INJECTOR | SUBCUTANEOUS | 3 refills | Status: DC
Start: 2023-02-11 — End: 2024-01-25

## 2023-02-11 MED ORDER — DEXCOM G7 SENSOR MISC: 3 refills | Status: DC

## 2023-02-11 MED ORDER — ASPIRIN 81 MG PO TBEC: 81.0000 mg | DELAYED_RELEASE_TABLET | Freq: Every day | ORAL | 3 refills | Status: DC

## 2023-02-11 MED ORDER — DOXAZOSIN MESYLATE 8 MG PO TABS
8.0000 mg | ORAL_TABLET | Freq: Every day | ORAL | 3 refills | Status: DC
Start: 2023-02-11 — End: 2024-01-08

## 2023-02-11 MED ORDER — AMLODIPINE BESYLATE 10 MG PO TABS: 10.0000 mg | ORAL_TABLET | Freq: Every day | ORAL | 3 refills | Status: DC

## 2023-02-11 MED ORDER — HYDRALAZINE HCL 50 MG PO TABS
50.0000 mg | ORAL_TABLET | Freq: Two times a day (BID) | ORAL | 3 refills | Status: DC
Start: 2023-02-11 — End: 2024-01-08

## 2023-02-11 MED ORDER — ATORVASTATIN CALCIUM 80 MG PO TABS
80.0000 mg | ORAL_TABLET | Freq: Every day | ORAL | 3 refills | Status: DC
Start: 2023-02-11 — End: 2024-01-25

## 2023-02-11 MED ORDER — BD PEN NEEDLE NANO 2ND GEN 32G X 4 MM MISC: 3 refills | Status: DC

## 2023-02-11 MED ORDER — CARVEDILOL PHOSPHATE ER 80 MG PO CP24
80.0000 mg | ORAL_CAPSULE | Freq: Every day | ORAL | 3 refills | Status: DC
Start: 2023-02-11 — End: 2023-02-23

## 2023-02-11 NOTE — Addendum Note (Signed)
Addended by: Kathreen Devoid on: 02/11/2023 02:42 PM   Modules accepted: Orders

## 2023-02-11 NOTE — Telephone Encounter (Signed)
Rxs sent

## 2023-02-12 ENCOUNTER — Ambulatory Visit (INDEPENDENT_AMBULATORY_CARE_PROVIDER_SITE_OTHER): Payer: Medicare HMO | Admitting: Podiatry

## 2023-02-12 ENCOUNTER — Telehealth: Payer: Self-pay

## 2023-02-12 ENCOUNTER — Ambulatory Visit (INDEPENDENT_AMBULATORY_CARE_PROVIDER_SITE_OTHER): Payer: Medicare HMO

## 2023-02-12 ENCOUNTER — Encounter: Payer: Self-pay | Admitting: Podiatry

## 2023-02-12 VITALS — BP 188/95 | HR 78 | Temp 96.3°F

## 2023-02-12 DIAGNOSIS — L97514 Non-pressure chronic ulcer of other part of right foot with necrosis of bone: Secondary | ICD-10-CM

## 2023-02-12 DIAGNOSIS — M778 Other enthesopathies, not elsewhere classified: Secondary | ICD-10-CM

## 2023-02-12 DIAGNOSIS — M869 Osteomyelitis, unspecified: Secondary | ICD-10-CM | POA: Diagnosis not present

## 2023-02-12 MED ORDER — AMOXICILLIN-POT CLAVULANATE 875-125 MG PO TABS: 1.0000 | ORAL_TABLET | Freq: Two times a day (BID) | ORAL | 0 refills | Status: DC

## 2023-02-12 NOTE — Telephone Encounter (Signed)
Would defer to Cards as pt was referred to HTN clinic.

## 2023-02-12 NOTE — Telephone Encounter (Signed)
Carvedilol  has a $100 copay with insurance for patient. He cannot afford and never started.  Is currently still using Carvedilol  BID and is requesting an rx for this medication be sent instead.  Pharmacy: [facility] - [place], [place] - [address] Phone: [phone]  Fax: [phone]      Sherrill Raring Clinical Pharmacist [phone]

## 2023-02-12 NOTE — Progress Notes (Signed)
This patient of Dr. Vara Guardian presents today states that my toes are swollen up again as he refers to toes 1 through 4 of the right foot.  He states that this just happened over the last few days denies any trauma denies any soaking of his feet.  States that he has not been sick no problems.  States that started with the swelling of the big toe and then it moved to the other toes.  Objective: Vital signs stable he is alert and oriented x 3 pulses are palpable.  He has superficial wounds to the second and fourth toes of the right foot however the hallux does not straight a very large bulbous macerated mass of tissue including the nail plate the tuft of the toe.  Once this was attempted to be debrided is noted that there was a foul odor no purulence no significant cellulitic process but bone was visible through the wound.  There is gray necrotic tissue beneath the macerated cutis.  Radiographs taken today demonstrate an osseously mature individual with what appears to be gas in the tissue of the toe.  There also demonstrates worst breakdown of the distal phalanx which appears to be osteolytic in nature most likely osteomyelitis.  Assessment: Osteomyelitis hallux with superficial skin breakdown to the lesser toes.  Concerned about gas producing organism to the hallux.  Plan: At this point discussed dressing these daily with him he understands and is amenable to it we are asking for a stat MRI of the bones to evaluate for possible amputation.  He will follow-up with Dr. Lilian Kapur over the doctor on-call for the hospital.  We placed him in a Darco shoe.  Assessment:

## 2023-02-13 ENCOUNTER — Ambulatory Visit
Admission: RE | Admit: 2023-02-13 | Discharge: 2023-02-13 | Disposition: A | Discharge status: Home / Self Care | Payer: Medicare HMO | Source: Ambulatory Visit | Attending: Podiatry | Admitting: Podiatry

## 2023-02-13 ENCOUNTER — Telehealth: Payer: Self-pay

## 2023-02-13 DIAGNOSIS — L97519 Non-pressure chronic ulcer of other part of right foot with unspecified severity: Secondary | ICD-10-CM | POA: Diagnosis not present

## 2023-02-13 DIAGNOSIS — E119 Type 2 diabetes mellitus without complications: Secondary | ICD-10-CM | POA: Diagnosis not present

## 2023-02-13 DIAGNOSIS — M869 Osteomyelitis, unspecified: Secondary | ICD-10-CM

## 2023-02-13 DIAGNOSIS — R600 Localized edema: Secondary | ICD-10-CM | POA: Diagnosis not present

## 2023-02-13 DIAGNOSIS — M86171 Other acute osteomyelitis, right ankle and foot: Secondary | ICD-10-CM | POA: Diagnosis not present

## 2023-02-13 LAB — CULTURE, BLOOD (ROUTINE X 2)
Culture: NO GROWTH
Special Requests: ADEQUATE

## 2023-02-13 NOTE — Telephone Encounter (Signed)
Carvedilol  has a $100 copay with insurance for patient. He cannot afford and never started.   Is currently still using Carvedilol  BID and is requesting an rx for this medication be sent instead, if possible?   Pharmacy: [facility] - [place], [place] - [address] Phone: [phone]  Fax: [phone]        Sherrill Raring Clinical Pharmacist [phone]

## 2023-02-14 LAB — CULTURE, BLOOD (ROUTINE X 2): Culture: NO GROWTH

## 2023-02-19 ENCOUNTER — Telehealth: Payer: Self-pay

## 2023-02-19 NOTE — Progress Notes (Signed)
Care Management & Coordination Services Pharmacy Team  Reason for Encounter: Recent home blood pressure readings  Contacted patient to discuss recent blood pressure readings.  Spoke with patient on 02/19/2023   Recommendations/Changes made from visit with Delano Metz on 02/09/2023: STOP Metoprolol and HCTZ as previously instructed INCREASE Amlodipine to  as previously instructed CONTINUE Carvedilol  BID and Doxazosin  at bedtime CONTINUE Hydralazine  BID CHECK BP TWICE DAILY, along with HR  Current home BP readings: patient not home, he thinks his readings were DATE:             BP               PULSE 02/19/2023 145/87  88   02/18/2023 165/80  87 02/17/2023 169/78  79  02/16/2023 173/85  - 02/15/2023 173/69  -   Recent Office Vitals: BP Readings from Last 3 Encounters:  02/12/23 (!) 188/95  02/09/23 112/61  02/08/23 (!) 194/98    Inetta Fermo CMA  Clinical Pharmacist Assistant [phone]

## 2023-02-23 ENCOUNTER — Telehealth (HOSPITAL_BASED_OUTPATIENT_CLINIC_OR_DEPARTMENT_OTHER): Payer: Self-pay | Admitting: Cardiovascular Disease

## 2023-02-23 DIAGNOSIS — I10 Essential (primary) hypertension: Secondary | ICD-10-CM

## 2023-02-23 MED ORDER — CARVEDILOL 25 MG PO TABS: 25.0000 mg | ORAL_TABLET | Freq: Two times a day (BID) | ORAL | 3 refills | Status: DC

## 2023-02-23 NOTE — Telephone Encounter (Signed)
Coreg CR often cost prohibitive. Recommend Carvedilol 25mg   BID.  Known white coat hypertension. He has home cuff which has been found to be accurate.   If BP consistently not at goal <130/80 by home readings, could increase Hydralazine. However, BP previously found to be controlled at home.   Alver Sorrow, NP

## 2023-02-23 NOTE — Telephone Encounter (Signed)
Returned call to Md office and left message. Called patient to let him know to continue the Carvedilol 25mg  BID. He does not need refills at this time will mark as a no print.

## 2023-02-23 NOTE — Telephone Encounter (Signed)
Pt c/o medication issue:  1. Name of Medication: carvedilol (COREG CR) 80 MG 24 hr capsule   2. How are you currently taking this medication (dosage and times per day)? Not currently taking  3. Are you having a reaction (difficulty breathing--STAT)? No   4. What is your medication issue? Wille Celeste is calling from PCP office stating the patient cannot afford this medication so it needs to be changed. She states the patient is currently taking 25 mg's twice a day. A message was sent to Dr. Duke Salvia about this by Dr. Salomon Fick, but they have not heard back. Please advise.

## 2023-02-23 NOTE — Telephone Encounter (Signed)
Looks like PCP tried to increase his Carvedilol up to 80mg  and he cannot afford it so they are asking for dosing advice

## 2023-02-24 ENCOUNTER — Telehealth (HOSPITAL_BASED_OUTPATIENT_CLINIC_OR_DEPARTMENT_OTHER): Payer: Self-pay | Admitting: Cardiovascular Disease

## 2023-02-24 MED ORDER — CARVEDILOL 25 MG PO TABS: 25.0000 mg | ORAL_TABLET | Freq: Two times a day (BID) | ORAL | 1 refills | Status: DC

## 2023-02-24 NOTE — Telephone Encounter (Signed)
Rx request sent to pharmacy.  

## 2023-02-24 NOTE — Telephone Encounter (Signed)
See 02/23/2023 phone note

## 2023-02-24 NOTE — Telephone Encounter (Signed)
*  STAT* If patient is at the pharmacy, call can be transferred to refill team.   1. Which medications need to be refilled? (please list name of each medication and dose if known) carvedilol (COREG) 25 MG tablet  2. Which pharmacy/location (including street and city if local pharmacy) is medication to be sent to? [facility] - [place], [place] - [address]  3. Do they need a 30 day or 90 day supply? 90  

## 2023-02-25 ENCOUNTER — Ambulatory Visit: Payer: Medicare HMO | Admitting: Podiatry

## 2023-02-25 DIAGNOSIS — L97514 Non-pressure chronic ulcer of other part of right foot with necrosis of bone: Secondary | ICD-10-CM

## 2023-02-25 DIAGNOSIS — M869 Osteomyelitis, unspecified: Secondary | ICD-10-CM

## 2023-02-25 NOTE — Progress Notes (Signed)
Patient presents with follow-up of MRI.  Patient known to Dr. Abbott Pao and Dr. Al Corpus.  They are here to go over the results of MRI which shows osteomyelitis.  Objective: Vital signs stable he is alert and oriented x 3 pulses are palpable.  He has superficial wounds to the second and fourth toes of the right foot however the hallux does not straight a very large bulbous macerated mass of tissue including the nail plate the tuft of the toe.  Once this was attempted to be debrided is noted that there was a foul odor no purulence no significant cellulitic process but bone was visible through the wound.  There is gray necrotic tissue beneath the macerated cutis.  Radiographs taken today demonstrate an osseously mature individual with what appears to be gas in the tissue of the toe.  There also demonstrates worst breakdown of the distal phalanx which appears to be osteolytic in nature most likely osteomyelitis.  Assessment: Osteomyelitis hallux with superficial skin breakdown to the lesser toes.  Concerned about gas producing organism to the hallux.  Plan: MRI was reviewed with the patient which confirms a diagnosis for osteomyelitis.  However patient refuses amputation he would like to do another opinion.  He states that he will find a second opinion will get back to me when he is ready.  I encouraged him to go to the emergency room to be admitted for amputation as that is the best method to clear the infection.  However patient continued to refuse amputation.

## 2023-03-01 IMAGING — MR MR TOES*R* W/O CM
4 of 5 series · 17 of 40 positions shown · non-contrast
Comparison: None.

CLINICAL DATA: Chronic great toe ulcer.

EXAM:
MRI OF THE RIGHT TOES WITHOUT CONTRAST
TECHNIQUE: Multiplanar, multisequence MR imaging of the right forefoot was
performed. No intravenous contrast was administered.

[Series 4: T1 · coronal · 3.0mm · 0.19mm/px · 3 of 48 slices shown (1 of 2)]
[im 9/48]
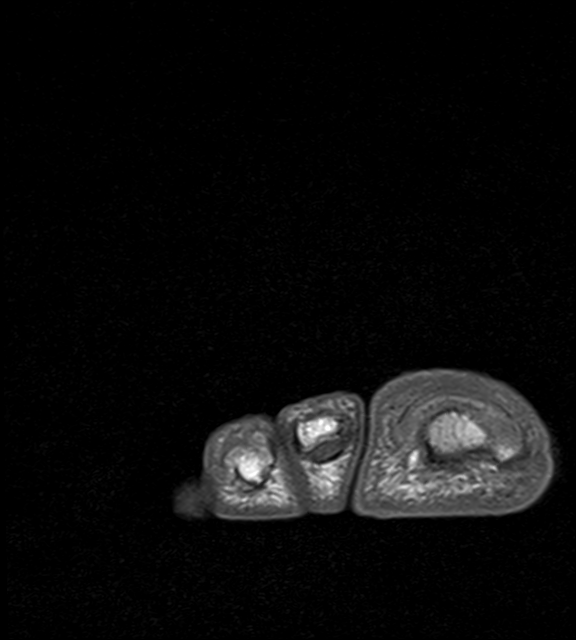
[im 26/48]
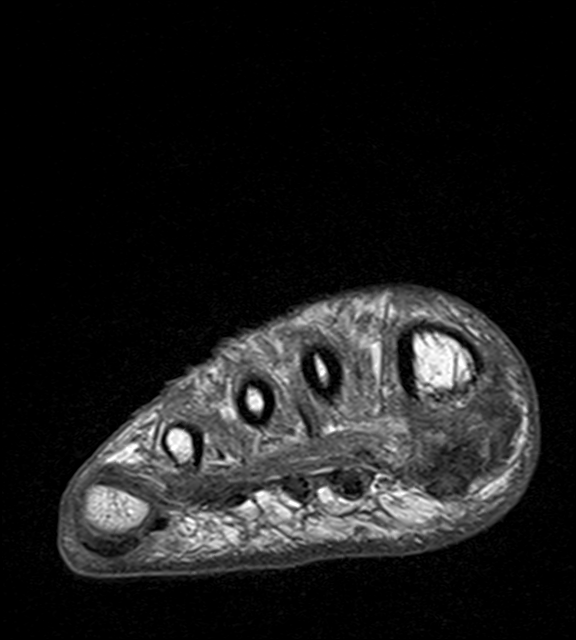
[im 43/48]
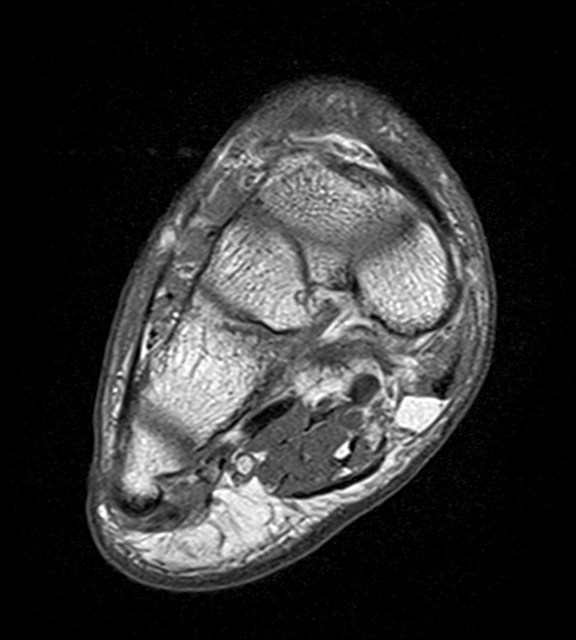

[Series 5: T2 fat-sat · coronal · 3.0mm · 0.19mm/px · 8 of 48 slices shown (1 of 2)]
[im 1/48]
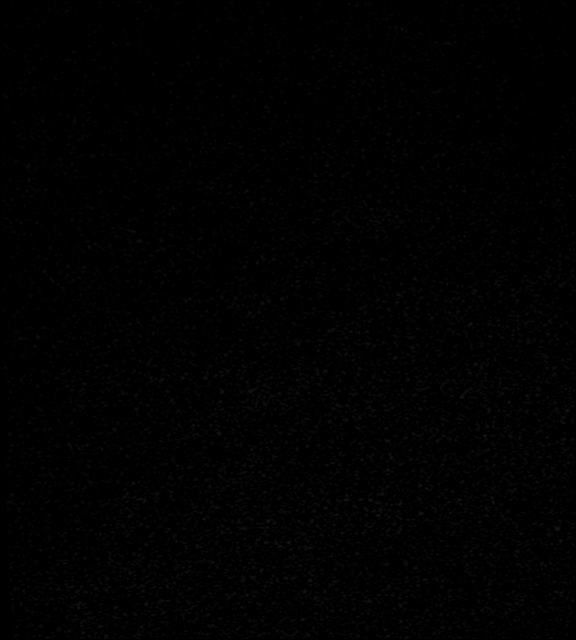
[im 5/48]
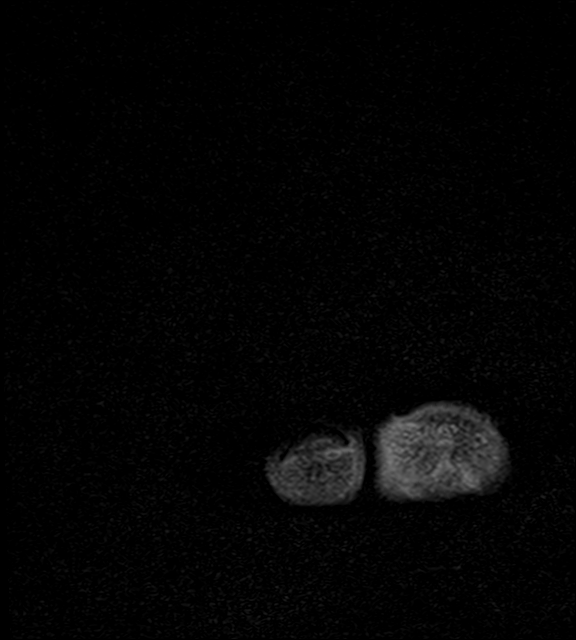
[im 10/48]
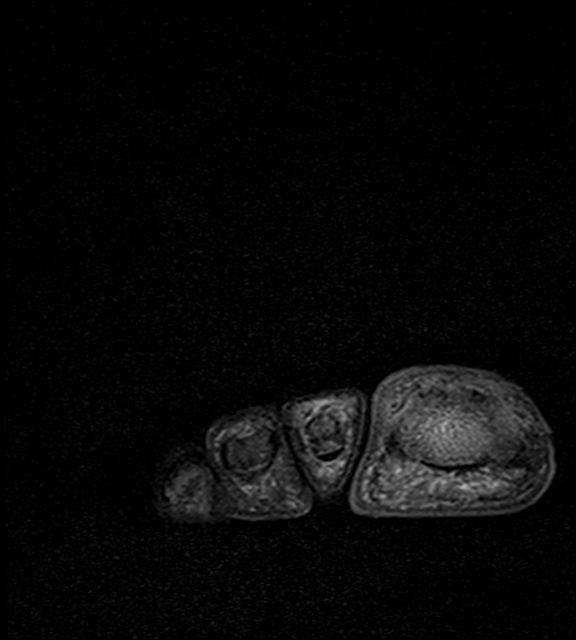
[im 15/48]
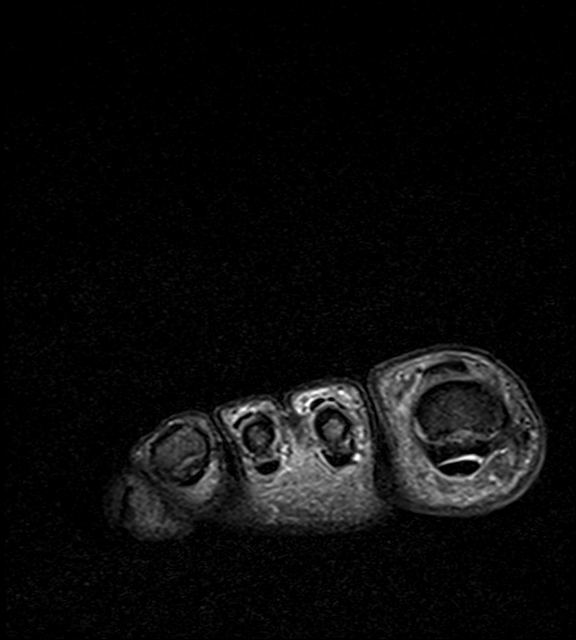
[im 19/48]
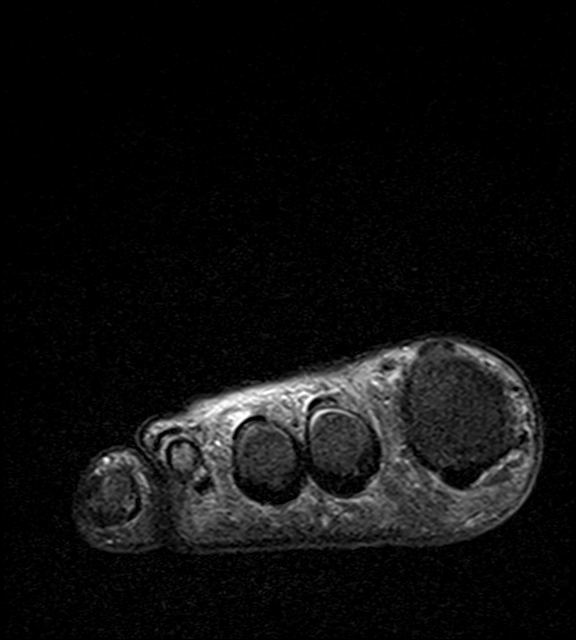
[im 24/48]
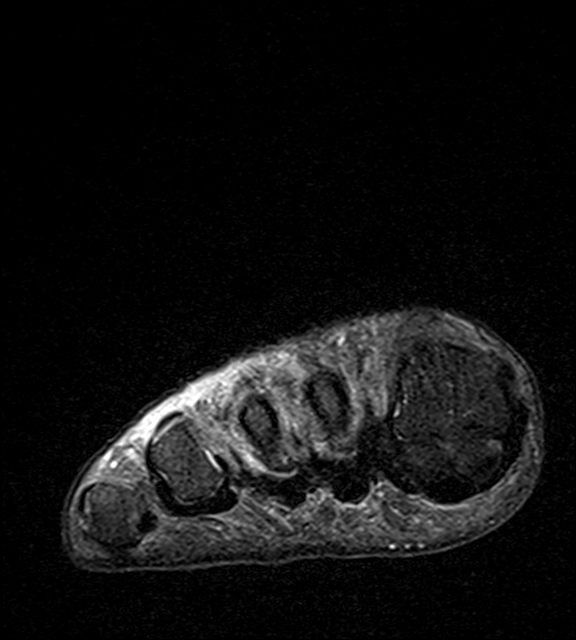
[im 29/48]
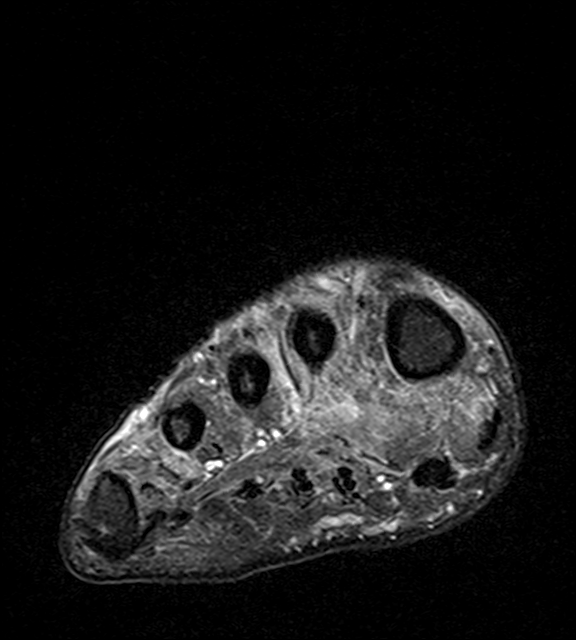
[im 43/48]
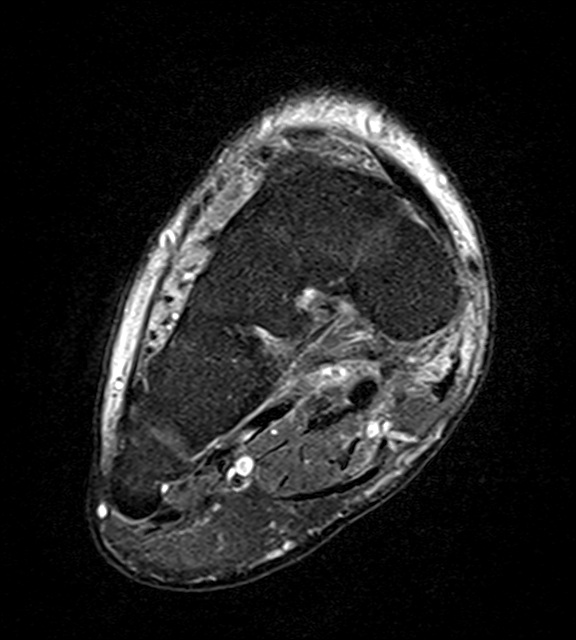

[Series 6: T2 fat-sat · axial · 3.0mm · 0.35mm/px · z∈[-67,+15]mm · 3 of 22 slices shown (2 of 2)]
[im 1/22]
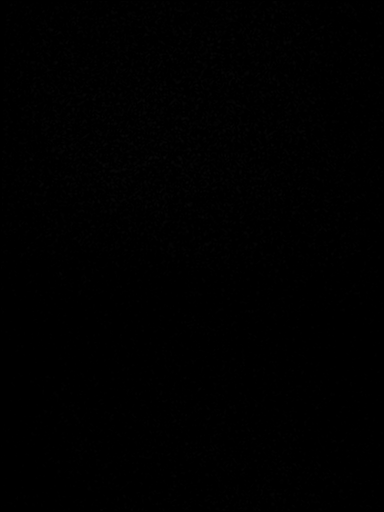
[im 11/22]
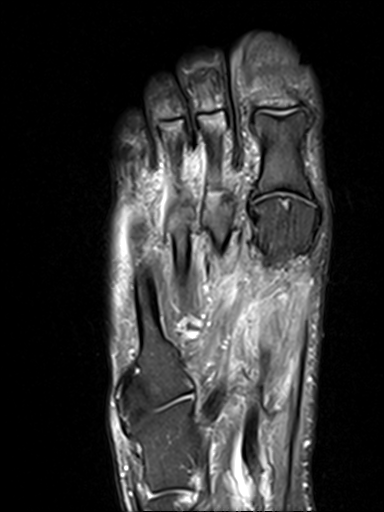
[im 22/22]
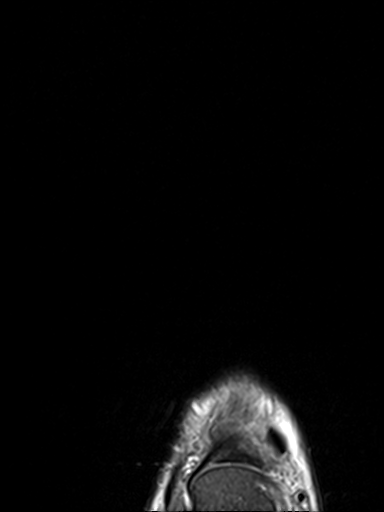

[Series 7: T1 · axial · 3.0mm · 0.35mm/px · z∈[-67,+15]mm · 3 of 22 slices shown (2 of 2)]
[im 1/22]
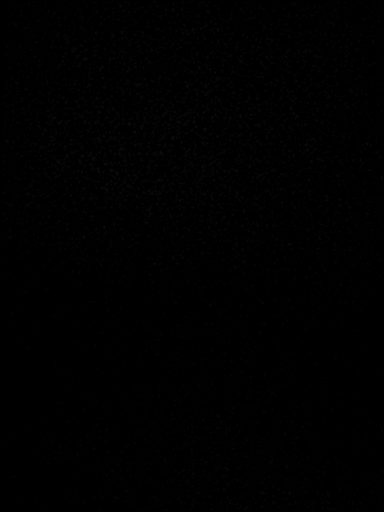
[im 11/22]
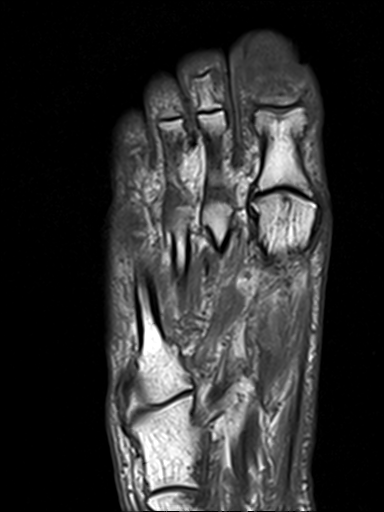
[im 22/22]
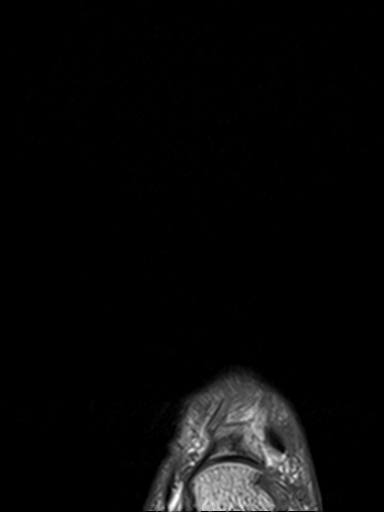

[17 of 40 positions shown; findings below may reference images not displayed]

FINDINGS: Bones/Joint/Cartilage

Diffuse marrow edema involving the first distal phalanx with mildly
decreased T1 marrow signal, most prominent at the tuft. Remaining
bone marrow signal is normal. No fracture or dislocation. First MTP
joint osteoarthritis. No joint effusion.

Ligaments

Collateral ligaments are intact.  Lisfranc ligament is intact.

Muscles and Tendons
Flexor and extensor tendons are intact. Increased T2 signal within
the intrinsic muscles of the forefoot, nonspecific, but likely
related to diabetic muscle changes.

Soft tissue
Soft tissue ulceration at the tip of the great toe. Mild great toe
and dorsal foot soft tissue swelling. No fluid collection or
hematoma. No soft tissue mass.
IMPRESSION: 1. Soft tissue ulceration at the tip of the great toe with
underlying osteomyelitis of the first distal phalanx. No abscess.

These results will be called to the ordering clinician or
representative by the Radiologist Assistant, and communication
documented in the PACS or [REDACTED].

## 2023-03-02 ENCOUNTER — Encounter (HOSPITAL_BASED_OUTPATIENT_CLINIC_OR_DEPARTMENT_OTHER): Payer: Medicare HMO | Attending: General Surgery | Admitting: General Surgery

## 2023-03-02 DIAGNOSIS — Z8546 Personal history of malignant neoplasm of prostate: Secondary | ICD-10-CM | POA: Diagnosis not present

## 2023-03-02 DIAGNOSIS — M1990 Unspecified osteoarthritis, unspecified site: Secondary | ICD-10-CM | POA: Insufficient documentation

## 2023-03-02 DIAGNOSIS — L97514 Non-pressure chronic ulcer of other part of right foot with necrosis of bone: Secondary | ICD-10-CM | POA: Diagnosis not present

## 2023-03-02 DIAGNOSIS — N1832 Chronic kidney disease, stage 3b: Secondary | ICD-10-CM | POA: Diagnosis not present

## 2023-03-02 DIAGNOSIS — E1140 Type 2 diabetes mellitus with diabetic neuropathy, unspecified: Secondary | ICD-10-CM | POA: Insufficient documentation

## 2023-03-02 DIAGNOSIS — I129 Hypertensive chronic kidney disease with stage 1 through stage 4 chronic kidney disease, or unspecified chronic kidney disease: Secondary | ICD-10-CM | POA: Insufficient documentation

## 2023-03-02 DIAGNOSIS — M86171 Other acute osteomyelitis, right ankle and foot: Secondary | ICD-10-CM | POA: Insufficient documentation

## 2023-03-02 DIAGNOSIS — E1122 Type 2 diabetes mellitus with diabetic chronic kidney disease: Secondary | ICD-10-CM | POA: Insufficient documentation

## 2023-03-02 DIAGNOSIS — L97512 Non-pressure chronic ulcer of other part of right foot with fat layer exposed: Secondary | ICD-10-CM | POA: Diagnosis not present

## 2023-03-02 DIAGNOSIS — E11621 Type 2 diabetes mellitus with foot ulcer: Secondary | ICD-10-CM | POA: Insufficient documentation

## 2023-03-03 DIAGNOSIS — L97522 Non-pressure chronic ulcer of other part of left foot with fat layer exposed: Secondary | ICD-10-CM | POA: Diagnosis not present

## 2023-03-03 DIAGNOSIS — L97512 Non-pressure chronic ulcer of other part of right foot with fat layer exposed: Secondary | ICD-10-CM | POA: Diagnosis not present

## 2023-03-03 NOTE — Progress Notes (Signed)
CASALINO, [name] ([id number]) [id number] Page 1 of 7 Visit Report for 03/02/2023 Allergy List Details Patient Name: Date of Service: [name] 03/02/2023 7:30 A M Medical Record Number: [id number] Patient Account Number: [id number] Date of Birth/Sex: Treating RN: 1951-06-08 (72 y.o. [name] Primary Care Jerrie Gullo: [name] Other Clinician: Referring Megin Consalvo: Treating Siobahn Worsley/Extender: [name]: 0 Allergies Active Allergies No Known Drug Allergies Allergy Notes Electronic Signature(s) Signed: 03/02/2023 5:27:15 PM By: Karie Schwalbe RN Entered By: Karie Schwalbe on 03/02/2023 08:00:10 -------------------------------------------------------------------------------- Arrival Information Details Patient Name: Date of Service: [name] RD, [name] 03/02/2023 7:30 A M Medical Record Number: [id number] Patient Account Number: [id number] Date of Birth/Sex: Treating RN: 05-15-1951 (72 y.o. [name] Primary Care Zamiya Rogness: [name] Other Clinician: Referring Patrici Minnis: Treating Llewellyn Schoenberger/Extender: [name] in Treatment: 0 Visit Information Patient Arrived: Ambulatory Arrival Time: 07:40 Accompanied By: self Transfer Assistance: None Patient Identification Verified: Yes Patient Has Alerts: Yes Patient Alerts: Patient on Blood Thinner Plavix ABI R  History Since Last Visit Added or deleted any medications: No Any new allergies or adverse reactions: No Had a fall or experienced change in activities of daily living that may affect risk of falls: No Signs or symptoms of abuse/neglect since last visito No Hospitalized since last visit: No Implantable device outside of the clinic excluding cellular tissue based products placed in the center since last visit: No Electronic Signature(s) Signed: 03/02/2023 5:27:15 PM By: Karie Schwalbe RN Entered By: Karie Schwalbe on 03/02/2023 08:07:07 -------------------------------------------------------------------------------- Clinic Level of Care Assessment Details Patient Name: Date of Service: [name] 03/02/2023 7:30 A M Medical Record Number: [id number] Patient Account Number: [id number] Date of Birth/Sex: Treating RN: 10-Jan-1951 (72 y.o. [name] Primary Care Makalynn Berwanger: [name] Other Clinician: Referring Edmonia Gonser: Treating Darlean Warmoth/Extender: [name] in Treatment: 0 Clinic Level of Care Assessment Items TOOL 1 Quantity Score X- 1 0 Use when EandM and Procedure is performed on INITIAL visit ASSESSMENTS - Nursing Assessment / Reassessment FRED, ADJEI ([id number]) [phone] Page 2 of 7 X- 1 20 General Physical Exam (combine w/ comprehensive assessment (listed just below) when performed on new pt. evals) X- 1 25 Comprehensive Assessment (HX, ROS, Risk Assessments, Wounds Hx, etc.) ASSESSMENTS - Wound and Skin Assessment / Reassessment X- 1 10 Dermatologic / Skin Assessment (not related to wound area) ASSESSMENTS - Ostomy and/or Continence Assessment and Care []  - 0 Incontinence Assessment and Management []  - 0 Ostomy Care Assessment and Management (repouching, etc.) PROCESS - Coordination of Care X - Simple Patient / Family Education for ongoing care 1 15 []  - 0 Complex (extensive) Patient / Family Education for ongoing care X- 1 10 Staff obtains Chiropractor, Records, T Results / Process Orders est X- 1 10 Staff telephones HHA, Nursing Homes / Clarify orders / etc []  - 0 Routine Transfer to another Facility (non-emergent condition) []  - 0 Routine Hospital Admission (non-emergent condition) X- 1 15 New Admissions / Manufacturing engineer / Ordering NPWT Apligraf, etc. , []  - 0 Emergency Hospital Admission (emergent condition) PROCESS - Special Needs []  - 0 Pediatric / Minor Patient Management []  -  0 Isolation Patient Management []  - 0 Hearing / Language / Visual special needs []  - 0 Assessment of Community assistance (transportation, D/C planning, etc.) []  - 0 Additional assistance / Altered mentation []  - 0 Support Surface(s) Assessment (bed, cushion, seat, etc.) INTERVENTIONS - Miscellaneous []  - 0 External ear exam []  -  0 Patient Transfer (multiple staff / Nurse, adult / Similar devices) []  - 0 Simple Staple / Suture removal (25 or less) []  - 0 Complex Staple / Suture removal (26 or more) []  - 0 Hypo/Hyperglycemic Management (do not check if billed separately) X- 1 15 Ankle / Brachial Index (ABI) - do not check if billed separately Has the patient been seen at the hospital within the last three years: Yes Total Score: 120 Level Of Care: New/Established - Level 4 Electronic Signature(s) Signed: 03/02/2023 5:27:15 PM By: Karie Schwalbe RN Entered By: Karie Schwalbe on 03/02/2023 17:00:31 -------------------------------------------------------------------------------- Encounter Discharge Information Details Patient Name: Date of Service: [name] RD, [name] 03/02/2023 7:30 A M Medical Record Number: [id number] Patient Account Number: [id number] Date of Birth/Sex: Treating RN: 1951-06-15 (72 y.o. [name] Primary Care Chrishana Spargur: [name] Other Clinician: Referring Terria Deschepper: Treating Lincon Sahlin/Extender: [name] in Treatment: 0 Encounter Discharge Information Items Post Procedure Vitals Discharge Condition: Stable Temperature (F): 98 Ambulatory Status: Ambulatory Pulse (bpm): 84 Discharge Destination: Home Respiratory Rate (breaths/min): 16 Transportation: Private Auto Blood Pressure (mmHg): 218/107 Accompanied By: self [name], [name] ([id number]) [id number] Page 3 of 7 Schedule Follow-up Appointment: Yes Clinical Summary of Care: Patient Declined Electronic Signature(s) Signed: 03/02/2023  5:27:15 PM By: Karie Schwalbe RN Entered By: Karie Schwalbe on 03/02/2023 17:01:45 -------------------------------------------------------------------------------- Lower Extremity Assessment Details Patient Name: Date of Service: [name] 03/02/2023 7:30 A M Medical Record Number: [id number] Patient Account Number: [id number] Date of Birth/Sex: Treating RN: [birth year] (72 y.o. [name] Primary Care Akashdeep Chuba: [name] Other Clinician: Referring Hugh Kamara: Treating Javel Hersh/Extender: [name]: 0 Edema Assessment Assessed: [Left: No] [Right: No] Edema: [Left: N] [Right: o] Calf Left: Right: Point of Measurement: 33 cm From Medial Instep 35 cm Ankle Left: Right: Point of Measurement: 10 cm From Medial Instep 22 cm Knee To Floor Left: Right: From Medial Instep 42 cm Electronic Signature(s) Signed: 03/02/2023 5:27:15 PM By: Karie Schwalbe RN Entered By: Karie Schwalbe on 03/02/2023 07:58:32 -------------------------------------------------------------------------------- Multi Wound Chart Details Patient Name: Date of Service: [name] RD, [name] 03/02/2023 7:30 A M Medical Record Number: [id number] Patient Account Number: [id number] Date of Birth/Sex: Treating RN: [birth year] (72 y.o. M) Primary Care Cozy Veale: [name] Other Clinician: Referring Kinslei Labine: Treating Jacorey Donaway/Extender: [name]: 0 Vital Signs Height(in): 67 Pulse(bpm): 84 Weight(lbs): 154 Blood Pressure(mmHg): 218/107 Body Mass Index(BMI): 24.1 Temperature(F): 98 Respiratory Rate(breaths/min): 16 [8:Photos:] [N/A:N/A] ANVAY, ARAKELIAN ([id number]) [8:Right T Great oe Wound Location: Gradually Appeared Wounding Event: Diabetic Wound/Ulcer of the Lower Primary Etiology: Extremity Hypertension, Type II Diabetes, Comorbid History: Osteoarthritis, Neuropathy 02/04/2023 Date Acquired: 0 Weeks of  Treatment:  Open Wound Status: No Wound Recurrence: 4.2x3.5x0.2 Measurements L x W x D (cm) 11.545 A (cm) : rea 2.309 Volume (cm) : Grade 3 Classification: MRI Wagner Verification: Medium Exudate A mount: Serosanguineous Exudate Type: red, brown Exudate  Color: Fibrotic scar, thickened scar Wound Margin: Small (1-33%) Granulation A mount: Pink Granulation Quality: Large (67-100%) Necrotic A mount: Eschar, Adherent Slough Necrotic Tissue: Fat Layer (Subcutaneous Tissue): Yes N/A Exposed Structures:  Fascia: No Tendon: No Muscle: No Joint: No Bone: No None Epithelialization: Debridement - Excisional Debridement: Pre-procedure Verification/Time Out 08:28 Taken: Lidocaine 4% Topical Solution Pain Control: Necrotic/Eschar, Fat, Other, Tissue Debrided:  Subcutaneous, Slough Skin/Subcutaneous Tissue Level: 11.54 Debridement A (sq cm): rea Curette, Forceps, Rongeur, Scissors Instrument: Minimum Bleeding: Pressure Hemostasis Achieved: 0 Procedural Pain: 0 Post Procedural Pain: Debridement Treatment  Response: Procedure was tolerated well Post Debridement Measurements L x 4.2x3.5x0.2 W x D (cm) 2.309 Post Debridement Volume: (cm) Callus: Yes Periwound Skin Texture: No Abnormalities Noted Periwound Skin Moisture: No Abnormalities Noted Periwound Skin  Color: No Abnormality Temperature: Debridement Procedures Performed:] [N/A:N/A N/A N/A N/A N/A N/A N/A N/A N/A N/A N/A N/A N/A N/A N/A N/A N/A N/A N/A N/A N/A N/A N/A N/A N/A N/A N/A N/A N/A N/A N/A N/A N/A N/A N/A N/A N/A N/A N/A N/A N/A] Treatment Notes Electronic Signature(s) Signed: 03/02/2023 9:20:48 AM By: [name] MD FACS Entered By: [name] on 03/02/2023 09:20:47 -------------------------------------------------------------------------------- Multi-Disciplinary Care Plan Details Patient Name: Date of Service: [name] RD, [name] 03/02/2023 7:30 A M Medical Record Number: [id number] Patient Account Number: [id number] Date of Birth/Sex: Treating  RN: 04-Jul-1951 (72 y.o. 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 hyperbaric oxygen therapy and treatment procedures PARIN, BONAM ([id number]) [phone] Page 5 of 7 Goals: Patient will tolerate the hyperbaric oxygen therapy treatment Date Initiated: 03/02/2023 Target Resolution Date: 05/27/2023 Goal Status: Active Interventions: Assess for signs and symptoms related to adverse events, including but not limited to confinement anxiety, pneumothorax, oxygen toxicity and baurotrauma Notes: Electronic Signature(s) Signed: 03/02/2023 5:27:15 PM By: Karie Schwalbe RN Entered By: Karie Schwalbe on 03/02/2023 16:58:33 -------------------------------------------------------------------------------- Pain Assessment Details Patient Name: Date of Service: [name] RDGeraldine Contras 03/02/2023 7:30 A M Medical Record Number: [id number] Patient Account Number: [id number] Date of Birth/Sex: Treating RN: January 29, 1951 (72 y.o. [name] Primary Care Lithzy Bernard: [name] Other Clinician: Referring Nylia Gavina: Treating Georgiann Neider/Extender: [name]: 0 Active Problems Location of Pain Severity and Description of Pain Patient Has Paino No Site Locations Pain Management and Medication Current Pain Management: Electronic Signature(s) Signed: 03/02/2023 5:27:15 PM By: Karie Schwalbe RN Entered By: Karie Schwalbe on 03/02/2023 16:57:36 -------------------------------------------------------------------------------- Patient/Caregiver Education Details Patient Name: Date of Service: [name] 5/6/2024andnbsp7:30 A M Medical Record Number: [id number] Patient Account Number: [id number] Date of Birth/Gender: Treating  RN: Feb 29, 1951 (72 y.o. [name] Primary Care Physician: [name] Other Clinician: Referring Physician: Treating Physician/Extender: [name] in Treatment: 0 Education Assessment Education Provided To: Patient JSAON, PLAZOLA ([id number]) [id number] Page 6 of 7 Education Topics Provided Hyperbaric Oxygenation: Methods: Demonstration, Explain/Verbal Responses: State content correctly Electronic Signature(s) Signed: 03/02/2023 5:27:15 PM By: Karie Schwalbe RN Entered By: Karie Schwalbe on 03/02/2023 16:58:51 -------------------------------------------------------------------------------- Wound Assessment Details Patient Name: Date of Service: [name] RD, [name] 03/02/2023 7:30 A M Medical Record Number: [id number] Patient Account Number: [id number] Date of Birth/Sex: Treating RN: 01-19-1951 (72 y.o. [name] Primary Care Harm Jou: [name] Other Clinician: Referring Deshon Hsiao: Treating Serrena Linderman/Extender: [name]: 0 Wound Status Wound Number: 8 Primary Etiology: Diabetic Wound/Ulcer of the Lower Extremity Wound Location: Right T Great oe Wound Status: Open Wounding Event: Gradually Appeared Comorbid History: Hypertension, Type II Diabetes, Osteoarthritis, Neuropathy Date Acquired: 02/04/2023 Weeks Of Treatment: 0 Clustered Wound: No Photos Wound Measurements Length: (cm) 4.2 Width: (cm) 3.5 Depth: (cm) 0.2 Area: (cm) 11.545 Volume: (cm) 2.309 % Reduction in Area: % Reduction in Volume: Epithelialization: None Tunneling: No Undermining: No Wound Description Classification: Grade 3 Wagner Verification: MRI Wound Margin: Fibrotic scar, thickened scar Exudate Amount: Medium Exudate Type: Serosanguineous Exudate Color: red, brown Foul Odor After Cleansing: No Slough/Fibrino  Yes Wound Bed Granulation Amount: Small (1-33%) Exposed  Structure Granulation Quality: Pink Fascia Exposed: No Necrotic Amount: Large (67-100%) Fat Layer (Subcutaneous Tissue) Exposed: Yes Necrotic Quality: Eschar, Adherent Slough Tendon Exposed: No Muscle Exposed: No Joint Exposed: No Bone Exposed: No Periwound Skin Texture Texture Color No Abnormalities Noted: No No Abnormalities Noted: Yes CallusTracen Dunkerley, Daric ([id number]) [id number] Page 7 of 7 Callus: Yes Temperature / Pain Temperature: No Abnormality Moisture No Abnormalities Noted: Yes Treatment Notes Wound #8 (Toe Great) Wound Laterality: Right Cleanser Normal Saline Discharge Instruction: Cleanse the wound with Normal Saline prior to applying a clean dressing using gauze sponges, not tissue or cotton balls. Soap and Water Discharge Instruction: May shower and wash wound with dial antibacterial soap and water prior to dressing change. Peri-Wound Care Topical Mupirocin Ointment Discharge Instruction: Apply Mupirocin (Bactroban) as instructed Primary Dressing Hydrofera Blue Classic Foam, 4x4 in Discharge Instruction: Moisten with saline prior to applying to wound bed Secondary Dressing Woven Gauze Sponges 2x2 in Discharge Instruction: Apply over primary dressing as directed. Secured With Conforming Stretch Gauze Bandage, Sterile 2x75 (in/in) Discharge Instruction: Secure with stretch gauze as directed. Paper Tape, 2x10 (in/yd) Discharge Instruction: Secure dressing with tape as directed. Compression Wrap Compression Stockings Add-Ons Electronic Signature(s) Signed: 03/02/2023 5:27:15 PM By: Karie Schwalbe RN Entered By: Karie Schwalbe on 03/02/2023 08:27:56 -------------------------------------------------------------------------------- Vitals Details Patient Name: Date of Service: [name] RD, [name] 03/02/2023 7:30 A M Medical Record Number: [id number] Patient Account Number: [id number] Date of Birth/Sex: Treating RN: [birth year]  (72 y.o. [name] Primary Care Veola Cafaro: [name] Other Clinician: Referring Jaloni Davoli: Treating Inesha Sow/Extender: [name]: 0 Vital Signs Time Taken: 07:41 Temperature (F): 98 Height (in): 67 Pulse (bpm): 84 Weight (lbs): 154 Respiratory Rate (breaths/min): 16 Body Mass Index (BMI): 24.1 Blood Pressure (mmHg): 218/107 Reference Range: 80 - 120 mg / dl Electronic Signature(s) Signed: 03/02/2023 5:27:15 PM By: Karie Schwalbe RN Entered By: Karie Schwalbe on 03/02/2023 08:00:05

## 2023-03-03 NOTE — Progress Notes (Signed)
JEWEL, CURTRIGHT ([id number]) [id number] Page 1 of 4 Visit Report for 03/02/2023 Abuse Risk Screen Details Patient Name: Date of Service: [name] 03/02/2023 7:30 [name] Medical Record Number: [id number] Patient Account Number: [id number] Date of Birth/Sex: Treating RN: 05-Aug-1951 (72 y.o. [name] Primary Care Besse Miron: [name] Other Clinician: Referring [name]: Treating [name]/Extender: [name] in Treatment: 0 Abuse Risk Screen Items Answer ABUSE RISK SCREEN: Has anyone close to you tried to hurt or harm you recentlyo No Do you feel uncomfortable with anyone in your familyo No Has anyone forced you do things that you didnt want to doo No Electronic Signature(s) Signed: 03/02/2023 5:27:15 PM By: [name] Schwalbe RN Entered By: [name] on 03/02/2023 07:51:16 -------------------------------------------------------------------------------- Activities of Daily Living Details Patient Name: Date of Service: [name] 03/02/2023 7:30 [name] Medical Record Number: [id number] Patient Account Number: [id number] Date of Birth/Sex: Treating RN: [birth year] (72 y.o. [name] Primary Care [name]: [name] Other Clinician: Referring [name]: Treating [name]/Extender: [name] in Treatment: 0 Activities of Daily Living Items Answer Activities of Daily Living (Please select one for each item) Drive Automobile Completely Able T Medications ake Completely Able Use T elephone Completely Able Care for Appearance Completely Able Use T oilet Completely Able Bath / Shower Completely Able Dress Self Completely Able Feed Self Completely Able Walk Completely Able Get In / Out Bed Completely Able Housework Completely Able Prepare Meals Completely Able Handle Money Completely Able Shop for Self Completely Able Electronic Signature(s) Signed: 03/02/2023  5:27:15 PM By: [name] Schwalbe RN Entered By: [name] on 03/02/2023 07:51:22 -------------------------------------------------------------------------------- Education Screening Details Patient Name: Date of Service: [name], [name] 03/02/2023 7:30 [name] Medical Record Number: [id number] Patient Account Number: [id number] Date of Birth/Sex: Treating RN: [birth year] (72 y.o. [name] Primary Care [name]: [name] Other Clinician: Referring [name]: Treating [name]/Extender: [name] in TreatmentNathen [name], [name] ([id number]) [id number] Page 2 of 4 Primary Learner Assessed: Patient Learning Preferences/Education Level/Primary Language Learning Preference: Explanation, Demonstration, Printed Material Preferred Language: English Cognitive Barrier Language Barrier: No Translator Needed: No Memory Deficit: No Emotional Barrier: No Cultural/Religious Beliefs Affecting Medical Care: No Physical Barrier Impaired Vision: No Impaired Hearing: No Decreased Hand dexterity: No Knowledge/Comprehension Knowledge Level: High Comprehension Level: High Ability to understand written instructions: High Ability to understand verbal instructions: High Motivation Anxiety Level: Calm Cooperation: Cooperative Education Importance: Acknowledges Need Interest in Health Problems: Asks Questions Perception: Coherent Willingness to Engage in Self-Management High Activities: Readiness to Engage in Self-Management High Activities: Electronic Signature(s) Signed: 03/02/2023 5:27:15 PM By: [name] Schwalbe RN Entered By: [name] on 03/02/2023 07:37:15 -------------------------------------------------------------------------------- Fall Risk Assessment Details Patient Name: Date of Service: [name], [name] 03/02/2023 7:30 [name] Medical Record Number: [id number] Patient Account Number: [id number] Date of  Birth/Sex: Treating RN: 1951-01-30 (72 y.o. [name] Primary Care [name]: [name] Other Clinician: Referring [name]: Treating [name]/Extender: [name] in Treatment: 0 Fall Risk Assessment Items Have you had 2 or more falls in the last 12 monthso 0 No Have you had any fall that resulted in injury in the last 12 monthso 0 No FALLS RISK SCREEN History of falling - immediate or within 3 months 0 No Secondary diagnosis (Do you have 2 or more medical diagnoseso) 0 No Ambulatory aid None/bed rest/wheelchair/nurse 0 No Crutches/cane/walker 0 No Furniture 0 No Intravenous therapy Access/Saline/Heparin Lock 0 No Gait/Transferring  Normal/ bed rest/ wheelchair 0 No Weak (short steps with or without shuffle, stooped but able to lift head while walking, may seek 0 No support from furniture) Impaired (short steps with shuffle, may have difficulty arising from chair, head down, impaired 0 No balance) Mental Status Oriented to own ability 0 No Overestimates or forgets limitations 0 No Risk Level: Low Risk Score: 0 [name], [name] ([id number]) [id number] Page 3 of 4 Electronic Signature(s) -------------------------------------------------------------------------------- Foot Assessment Details Patient Name: Date of Service: [name] 03/02/2023 7:30 [name] Medical Record Number: [id number] Patient Account Number: [id number] Date of Birth/Sex: Treating RN: 12-31-1950 (72 y.o. [name] Primary Care [name]: [name] Other Clinician: Referring [name]: Treating [name]/Extender: [name] in Treatment: 0 Foot Assessment Items Site Locations + = Sensation present, - = Sensation absent, C = Callus, U = Ulcer R = Redness, W = Warmth, [name] = Maceration, PU = Pre-ulcerative lesion F = Fissure, S = Swelling, D = Dryness Assessment Right: Left: Other Deformity: No  No Prior Foot Ulcer: No No Prior Amputation: No No Charcot Joint: No No Ambulatory Status: Ambulatory Without Help Gait: Steady Electronic Signature(s) Signed: 03/02/2023 5:27:15 PM By: [name] Schwalbe RN Entered By: [name] on 03/02/2023 07:58:02 -------------------------------------------------------------------------------- Nutrition Risk Screening Details Patient Name: Date of Service: [name] 03/02/2023 7:30 [name] Medical Record Number: [id number] Patient Account Number: [id number] Date of Birth/Sex: Treating RN: [birth year] (72 y.o. [name] Primary Care Ayvah Caroll: [name] Other Clinician: Referring Macario Shear: Treating Soliyana Mcchristian/Extender: [name] in Treatment: 0 Height (in): 66 Weight (lbs): 159 Body Mass Index (BMI): 25.7 Pinnock, [name] ([id number]) [phone] [id number] Page 4 of 4 Nutrition Risk Screening Items Score Screening NUTRITION RISK SCREEN: I have an illness or condition that made me change the kind and/or amount of food I eat 0 No I eat fewer than two meals per day 0 No I eat few fruits and vegetables, or milk products 0 No I have three or more drinks of beer, liquor or wine almost every day 0 No I have tooth or mouth problems that make it hard for me to eat 0 No I don't always have enough money to buy the food I need 0 No I eat alone most of the time 0 No I take three or more different prescribed or over-the-counter drugs [name] day 0 No Without wanting to, I have lost or gained 10 pounds in the last six months 0 No I am not always physically able to shop, cook and/or feed myself 0 No Nutrition Protocols Good Risk Protocol 0 No interventions needed Moderate Risk Protocol High Risk Proctocol Risk Level: Good Risk Score: 0 Electronic Signature(s) Signed: 03/02/2023 5:27:15 PM By: [name] Schwalbe RN Entered By: [name] on 03/02/2023 07:37:26

## 2023-03-03 NOTE — Progress Notes (Addendum)
ARWIN, HAAG ([id number]) [id number] Page 1 of 10 Visit Report for 03/02/2023 Chief Complaint Document Details Patient Name: Date of Service: [name] 03/02/2023 7:30 [name] M Medical Record Number: [id number] Patient Account Number: [id number] Date of Birth/Sex: Treating [name]: June 11, 1951 (72 y.o. M) Primary Care Provider: Abbe [name] Other Clinician: Referring Provider: Treating Provider/Extender: [name] Weeks in Treatment: 0 Information Obtained from: Patient Chief Complaint Bilateral T Ulcers oe 03/02/2023: right great toe ulcer with osteomyelitis Electronic Signature(s) Signed: 03/02/2023 9:20:55 AM By: [name] Guess MD FACS Previous Signature: 03/02/2023 7:45:20 AM Version By: [name] Guess MD FACS Entered By: [name] on 03/02/2023 09:20:55 -------------------------------------------------------------------------------- Debridement Details Patient Name: Date of Service: [name], [name] 03/02/2023 7:30 [name] M Medical Record Number: [id number] Patient Account Number: [id number] Date of Birth/Sex: Treating [name]: Oct 14, 1951 (72 y.o. M) Primary Care Provider: Abbe [name] Other Clinician: Referring Provider: Treating Provider/Extender: [name] Weeks in Treatment: 0 Debridement Performed for Assessment: Wound #8 Right T Great oe Performed By: Physician [name] Guess, MD Debridement Type: Debridement Severity of Tissue Pre Debridement: Fat layer exposed Level of Consciousness (Pre-procedure): Awake and Alert Pre-procedure Verification/Time Out Yes - 08:28 Taken: Start Time: 08:28 Pain Control: Lidocaine 4% Topical Solution Percent of Wound Bed Debrided: 100% T Area Debrided (cm): otal 11.54 Tissue and other material debrided: Non-Viable, Bone, Eschar, Fat, Slough, Subcutaneous, Slough Level: Skin/Subcutaneous Tissue/Muscle/Bone Debridement Description: Excisional Instrument:  Curette, Forceps, Rongeur, Scissors Bleeding: Minimum Hemostasis Achieved: Pressure End Time: 08:31 Procedural Pain: 0 Post Procedural Pain: 0 Response to Treatment: Procedure was tolerated well Level of Consciousness (Post- Awake and Alert procedure): Post Debridement Measurements of Total Wound Length: (cm) 4.2 Width: (cm) 3.5 Depth: (cm) 0.2 Volume: (cm) 2.309 Character of Wound/Ulcer Post Debridement: Improved Severity of Tissue Post Debridement: Fat layer exposed Post Procedure Diagnosis Same as Pre-procedure Notes [name], [name] ([id number]) [id number] Page 2 of 10 Scribed for Dr. Lady [name] by [name] Electronic Signature(s) Signed: 03/02/2023 9:28:55 AM By: [name] Guess MD FACS Entered By: [name] on 03/02/2023 09:28:55 -------------------------------------------------------------------------------- HPI Details Patient Name: Date of Service: [name], [name] 03/02/2023 7:30 [name] M Medical Record Number: [id number] Patient Account Number: [id number] Date of Birth/Sex: Treating [name]: 1951/08/30 (72 y.o. M) Primary Care Provider: Abbe [name] Other Clinician: Referring Provider: Treating Provider/Extender: [name] in Treatment: 0 History of Present Illness HPI Description: 09/26/2020 on evaluation today patient appears to be doing somewhat poorly in regard to his bilateral feet at this point due to issues that he is been having that developed about 2 weeks ago. He was walking home which was about [name] mile and states that he first noted the areas on the right foot beginning. These were blisters that develop. The left started Thanksgiving day when he was up standing more cooking. He did see his primary care provider who referred him to podiatry. He saw [name] and [name] recommended Betadine moistened gauze dressings to the wounds and to follow-up in 2 weeks. With that being said the patient subsequently  was concerned about infection 2 to 3 days later as was his family member who is with him today. I believe this was [name]. With that being said subsequently he ended up going to urgent care at that point he was given Diflucan and Keflex he is done with both at this time. He is could be canceling the appointment with [name] they tell me at this point. His most recent hemoglobin A1c was 6.6 on  November 12. His ABIs were 1.2 on the right and 1.03 on the left and appear to be doing excellent. With that being said he does not have diabetic shoes and I feel like the shoes were likely the culprit for what led to this issue currently. There does not appear to be any signs of systemic infection nor local infection at this point which is great news. The patient does have [name] history of hypertension as well as potentially [name] recurrence of prostate cancer he is being followed by the cancer center for that at this point 10/03/2020 upon evaluation today patient appears to be doing better in regard to his toe ulcers. He has been tolerating the dressing changes without complication. Fortunately there is no signs of active infection at this time. The right great toe and fifth toe are the 2 worst out of everything here he has [name] couple that have healed on the left foot there is really nothing remaining open though he has several areas of callus on the left foot in fact 3 on the third fourth and fifth toes. That is going need to be removed today as well. 10/10/2020 upon evaluation today patient appears to be doing well in regard to his wounds. He still has wounds open on the first, second, and fifth toes of the right foot. Left foot is completely healed and the right fourth toe is also completely healed today. Overall very pleased with how things seem to be progressing. 10/17/2020 on evaluation today patient appears to be doing much better in regard to the right first, second, and fifth toes. He is making excellent  progress and to be honest I am extremely pleased with where things stand today. Fortunately there is no signs of active infection at this time. No fevers, chills, nausea, vomiting, or diarrhea. 11/07/2020 upon evaluation today patient appears to actually be doing excellent in regard to his toe ulcers. The fifth and first toe on the right foot are what is left at this point. There does not appear to be signs of infection currently which is great news. With that being said I did perform some sharp debridement today to remove some of the callus around the edges of the wound 11/14/2020 upon evaluation today patient appears to be doing excellent in regard to his toe ulcers. In fact he is almost completely healed he just has 1 area on the great toe remaining at this time and this is very small. Overall he is extremely pleased with where things stand as MI. 11/21/2020 upon evaluation today patient appears to be doing well with regard to his foot ulcer. In fact this appears to be completely healed on the great toe which was the last of the areas of concern. Fortunately there is no signs of active infection at this time. No fevers, chills, nausea, vomiting, or diarrhea. READMISSION 03/02/2023 This is [name] now 72 year old type II diabetic (last hemoglobin A1c 7.9%) who returns with [name] right great toe ulcer. On February 12, 2023, he was seen by podiatry who noted: "He has superficial wounds to the second and fourth toes of the right foot however the hallux does not straight [name] very large bulbous macerated mass of tissue including the nail plate the tuft of the toe. Once this was attempted to be debrided is noted that there was [name] foul odor no purulence no significant cellulitic process but bone was visible through the wound. There is gray necrotic tissue beneath the macerated cutis." Plain radiographs taken that day  demonstrated breakdown of the distal phalanx, gas in the tissue of the toe. [name] stat MRI was subsequently  ordered. Those results are copied here: IMPRESSION: 1. Osteomyelitis of the distal phalanx great toe with overlying ulceration and speckled gas in the soft tissues of the great toe. 2. Mild dorsal subcutaneous edema in the forefoot, cellulitis is not excluded by imaging. 3. Accentuated T2 signal dorsally over the second and third toes, conceivably from bandaging or blistering, correlate with visual inspection. 4. Small focus of metal artifact along the plantar soft tissues of the forefoot below the third metatarsal head, not visible on recent conventional radiographs, probably [name] microscopic metallic particle. He was started on Augmentin for 10-day course. He apparently requested [name] second opinion regarding the osteomyelitis and need for amputation and saw [name] on May 1. Dr. Eliane [name] note is incomplete at this time so it is not clear what was discussed. I do not see any formal vascular studies in the patient's chart. ABI in clinic today was noncompressible, but he has easily palpable distal pulses. Electronic Signature(s) Signed: 03/02/2023 9:21:25 AM By: [name] Guess MD FACS Previous Signature: 03/02/2023 8:13:45 AM Version By: [name] Guess MD FACS Previous Signature: 03/02/2023 7:47:15 AM Version By: [name] Guess MD FACS Entered By: [name] on 03/02/2023 09:21:25 Schissler, [name] ([id number]) [id number] Page 3 of 10 -------------------------------------------------------------------------------- Physical Exam Details Patient Name: Date of Service: [name] 03/02/2023 7:30 [name] M Medical Record Number: [id number] Patient Account Number: [id number] Date of Birth/Sex: Treating [name]: 1951/10/07 (72 y.o. M) Primary Care Provider: Abbe [name] Other Clinician: Referring Provider: Treating Provider/Extender: [name] Weeks in Treatment: 0 Constitutional Hypertensive, asymptomatic. . . . No acute distress. Ears,  Nose, Mouth, and Throat Tympanic membranes completely obscured by cerumen. Respiratory .Marland Kitchen [facility] 2/6 systolic ejection murmur. . Notes 03/02/2023: His right great toe is bulbous and misshapen. There is [name] foul odor emanating from it. There is necrotic tissue at the tip, underneath which bone is exposed, protruding from the tip of the toe. There is additional nonviable fat and subcutaneous tissue surrounding the bone. Electronic Signature(s) Signed: 03/02/2023 9:23:50 AM By: [name] Guess MD FACS Entered By: [name] on 03/02/2023 09:23:50 -------------------------------------------------------------------------------- Physician Orders Details Patient Name: Date of Service: [name], [name] 03/02/2023 7:30 [name] M Medical Record Number: [id number] Patient Account Number: [id number] Date of Birth/Sex: Treating [name]: 1951/06/19 (72 y.o. [name] Primary Care Provider: Abbe [name] Other Clinician: Referring Provider: Treating Provider/Extender: [name] in Treatment: 0 Verbal / Phone Orders: No Diagnosis Coding ICD-10 Coding Code Description L97.514 Non-pressure chronic ulcer of other part of right foot with necrosis of bone M86.171 Other acute osteomyelitis, right ankle and foot E11.621 Type 2 diabetes mellitus with foot ulcer I10 Essential (primary) hypertension N18.32 Chronic kidney disease, stage 3b Follow-up Appointments ppointment in 1 week. - Dr. Lady [name] Room 3 Return [name] Other: - ++Pick up antibiotics at [facility]+++ Anesthetic (In clinic) Topical Lidocaine 4% applied to wound bed Bathing/ Shower/ Hygiene May shower and wash wound with soap and water. - After showering dress the wound with [name] new dressing. Edema Control - Lymphedema / SCD / Other Bilateral Lower Extremities Avoid standing for long periods of time. Exercise regularly Moisturize legs daily. Other Edema Control Orders/Instructions: - When sitting elevate legs at  heart level or above heart level Off-Loading [place], Jerol ([id number]) [id number] Page 4 of 10 Wound #8 Right T Great oe Other: - Wear the Darco shoe when standing/walking. Additional Orders /  Instructions Other: - If you have loose stools get Probiotics and start them. T clean the ears of wax, use equal parts of Hydrogen Peroxide and warm water and use [name] syringe to put solution in ear to clean the wax out. o Hyperbaric Oxygen Therapy Wound #8 Right T Great oe Evaluate for HBO Therapy Indication: - Wagner Grade 3 Diabetic Foot Ulcer 2.5 ATA for 90 Minutes with 2 Five (5) Minute [name] Breaks ir Total Number of Treatments: - 40 One treatments per day (delivered Monday through Friday unless otherwise specified in Special Instructions below): Finger stick Blood Glucose Pre- and Post- HBOT Treatment. Follow Hyperbaric Oxygen Glycemia Protocol Give two 4oz orange juices in addition to Glucerna when the glycemic protocol is used. Afrin (Oxymetazoline HCL) 0.05% nasal spray - 1 spray in both nostrils daily as needed prior to HBO treatment for difficulty clearing ears Wound Treatment Wound #8 - T Great oe Wound Laterality: Right Cleanser: Normal Saline (DME) (Generic) 1 x Per Day/30 Days Discharge Instructions: Cleanse the wound with Normal Saline prior to applying [name] clean dressing using gauze sponges, not tissue or cotton balls. Cleanser: Soap and Water 1 x Per Day/30 Days Discharge Instructions: May shower and wash wound with dial antibacterial soap and water prior to dressing change. Topical: Mupirocin Ointment (Generic) 1 x Per Day/30 Days Discharge Instructions: Apply Mupirocin (Bactroban) as instructed Prim Dressing: Hydrofera Blue Classic Foam, 4x4 in (DME) (Generic) 1 x Per Day/30 Days ary Discharge Instructions: Moisten with saline prior to applying to wound bed Secondary Dressing: Woven Gauze Sponges 2x2 in (DME) (Generic) 1 x Per Day/30 Days Discharge  Instructions: Apply over primary dressing as directed. Secured With: Insurance underwriter, Sterile 2x75 (in/in) (DME) (Generic) 1 x Per Day/30 Days Discharge Instructions: Secure with stretch gauze as directed. Secured With: Paper Tape, 2x10 (in/yd) (DME) (Generic) 1 x Per Day/30 Days Discharge Instructions: Secure dressing with tape as directed. Patient Medications llergies: No Known Drug Allergies [name] Notifications Medication Indication Start End 03/02/2023 amoxicillin-pot clavulanate DOSE oral 875 mg-125 mg tablet - 1 tab p.o. twice daily x 28 days 03/02/2023 doxycycline hyclate DOSE oral 100 mg capsule - 1 capsule p.o. twice daily x 28 days GLYCEMIA INTERVENTIONS PROTOCOL PRE-HBO GLYCEMIA INTERVENTIONS ACTION INTERVENTION Obtain pre-HBO capillary blood glucose (ensure 1 physician order is in chart). [name]. Notify HBO physician and await physician orders. 2 If result is 70 mg/dl or below: B. If the result meets the hospital definition of [name] critical result, follow hospital policy. [name]. Give patient an 8 ounce Glucerna Shake, an 8 ounce Ensure, or 8 ounces of [name] Glucerna/Ensure equivalent dietary supplement*. B. Wait 30 minutes. If result is 71 mg/dl to 161 mg/dl: C. Retest patients capillary blood glucose (CBG). D. If result greater than or equal to 110 mg/dl, proceed with HBO. If result less than 110 mg/dl, notify HBO physician and consider holding HBO. If result is 131 mg/dl to 096 mg/dl: [name]. Proceed with HBO. [name]. Notify HBO physician and await physician orders. B. It is recommended to hold HBO and do If result is 250 mg/dl or greater: blood/urine ketone testing. C. If the result meets the hospital definition of [name] SATISH, GONSALEZ ([id number]) [id number] Page 5 of 10 critical result, follow hospital policy. POST-HBO GLYCEMIA INTERVENTIONS ACTION INTERVENTION Obtain post HBO capillary blood glucose (ensure 1 physician order is in  chart). [name]. Notify HBO physician and await physician orders. 2 If result is 70 mg/dl or below: B. If the result meets the hospital definition  of [name] critical result, follow hospital policy. [name]. Give patient an 8 ounce Glucerna Shake, an 8 ounce Ensure, or 8 ounces of [name] Glucerna/Ensure equivalent dietary supplement*. B. Wait 15 minutes for symptoms of If result is 71 mg/dl to 604 mg/dl: hypoglycemia (i.e. nervousness, anxiety, sweating, chills, clamminess, irritability, confusion, tachycardia or dizziness). C. If patient asymptomatic, discharge patient. If patient symptomatic, repeat capillary blood glucose (CBG) and notify HBO physician. If result is 101 mg/dl to 540 mg/dl: [name]. Discharge patient. [name]. Notify HBO physician and await physician orders. B. It is recommended to do blood/urine ketone If result is 250 mg/dl or greater: testing. C. If the result meets the hospital definition of [name] critical result, follow hospital policy. *Juice or candies are NOT equivalent products. If patient refuses the Glucerna or Ensure, please consult the hospital dietitian for an appropriate substitute. Electronic Signature(s) Signed: 03/02/2023 11:24:09 AM By: [name] Guess MD FACS Signed: 03/02/2023 5:27:15 PM By: Karie Schwalbe [name] Previous Signature: 03/02/2023 9:26:00 AM Version By: [name] Guess MD FACS Entered By: Karie Schwalbe on 03/02/2023 10:25:35 -------------------------------------------------------------------------------- Problem List Details Patient Name: Date of Service: [name], [name] 03/02/2023 7:30 [name] M Medical Record Number: [id number] Patient Account Number: [id number] Date of Birth/Sex: Treating [name]: 11/28/1950 (72 y.o. M) Primary Care Provider: Abbe [name] Other Clinician: Referring Provider: Treating Provider/Extender: [name] Weeks in Treatment: 0 Active Problems ICD-10 Encounter Code Description Active Date MDM Diagnosis L97.514  Non-pressure chronic ulcer of other part of right foot with necrosis of bone 03/02/2023 No Yes M86.171 Other acute osteomyelitis, right ankle and foot 03/02/2023 No Yes E11.621 Type 2 diabetes mellitus with foot ulcer 03/02/2023 No Yes I10 Essential (primary) hypertension 03/02/2023 No Yes N18.32 Chronic kidney disease, stage 3b 03/02/2023 No Yes Inactive Problems Resolved Problems DAILEN, MILNE ([id number]) [id number] Page 6 of 10 Electronic Signature(s) Signed: 03/02/2023 7:43:21 AM By: [name] Guess MD FACS Entered By: [name] on 03/02/2023 07:43:21 -------------------------------------------------------------------------------- Progress Note Details Patient Name: Date of Service: [name], [name] 03/02/2023 7:30 [name] M Medical Record Number: [id number] Patient Account Number: [id number] Date of Birth/Sex: Treating [name]: 1951/08/21 (72 y.o. M) Primary Care Provider: Abbe [name] Other Clinician: Referring Provider: Treating Provider/Extender: [name] Weeks in Treatment: 0 Subjective Chief Complaint Information obtained from Patient Bilateral T Ulcers 03/02/2023: right great toe ulcer with osteomyelitis oe History of Present Illness (HPI) 09/26/2020 on evaluation today patient appears to be doing somewhat poorly in regard to his bilateral feet at this point due to issues that he is been having that developed about 2 weeks ago. He was walking home which was about [name] mile and states that he first noted the areas on the right foot beginning. These were blisters that develop. The left started Thanksgiving day when he was up standing more cooking. He did see his primary care provider who referred him to podiatry. He saw [name] and [name] recommended Betadine moistened gauze dressings to the wounds and to follow-up in 2 weeks. With that being said the patient subsequently was concerned about infection 2 to 3 days later as was his family  member who is with him today. I believe this was [name]. With that being said subsequently he ended up going to urgent care at that point he was given Diflucan and Keflex he is done with both at this time. He is could be canceling the appointment with [name] they tell me at this point. His most recent hemoglobin A1c was 6.6 on  November 12. His ABIs were 1.2 on the right and 1.03 on the left and appear to be doing excellent. With that being said he does not have diabetic shoes and I feel like the shoes were likely the culprit for what led to this issue currently. There does not appear to be any signs of systemic infection nor local infection at this point which is great news. The patient does have [name] history of hypertension as well as potentially [name] recurrence of prostate cancer he is being followed by the cancer center for that at this point 10/03/2020 upon evaluation today patient appears to be doing better in regard to his toe ulcers. He has been tolerating the dressing changes without complication. Fortunately there is no signs of active infection at this time. The right great toe and fifth toe are the 2 worst out of everything here he has [name] couple that have healed on the left foot there is really nothing remaining open though he has several areas of callus on the left foot in fact 3 on the third fourth and fifth toes. That is going need to be removed today as well. 10/10/2020 upon evaluation today patient appears to be doing well in regard to his wounds. He still has wounds open on the first, second, and fifth toes of the right foot. Left foot is completely healed and the right fourth toe is also completely healed today. Overall very pleased with how things seem to be progressing. 10/17/2020 on evaluation today patient appears to be doing much better in regard to the right first, second, and fifth toes. He is making excellent progress and to be honest I am extremely pleased with where things stand  today. Fortunately there is no signs of active infection at this time. No fevers, chills, nausea, vomiting, or diarrhea. 11/07/2020 upon evaluation today patient appears to actually be doing excellent in regard to his toe ulcers. The fifth and first toe on the right foot are what is left at this point. There does not appear to be signs of infection currently which is great news. With that being said I did perform some sharp debridement today to remove some of the callus around the edges of the wound 11/14/2020 upon evaluation today patient appears to be doing excellent in regard to his toe ulcers. In fact he is almost completely healed he just has 1 area on the great toe remaining at this time and this is very small. Overall he is extremely pleased with where things stand as MI. 11/21/2020 upon evaluation today patient appears to be doing well with regard to his foot ulcer. In fact this appears to be completely healed on the great toe which was the last of the areas of concern. Fortunately there is no signs of active infection at this time. No fevers, chills, nausea, vomiting, or diarrhea. READMISSION 03/02/2023 This is [name] now 72 year old type II diabetic (last hemoglobin A1c 7.9%) who returns with [name] right great toe ulcer. On February 12, 2023, he was seen by podiatry who noted: "He has superficial wounds to the second and fourth toes of the right foot however the hallux does not straight [name] very large bulbous macerated mass of tissue including the nail plate the tuft of the toe. Once this was attempted to be debrided is noted that there was [name] foul odor no purulence no significant cellulitic process but bone was visible through the wound. There is gray necrotic tissue beneath the macerated cutis." Plain radiographs taken that day  demonstrated breakdown of the distal phalanx, gas in the tissue of the toe. [name] stat MRI was subsequently ordered. Those results are copied here: IMPRESSION: 1. Osteomyelitis of the  distal phalanx great toe with overlying ulceration and speckled gas in the soft tissues of the great toe. 2. Mild dorsal subcutaneous edema in the forefoot, cellulitis is not excluded by imaging. 3. Accentuated T2 signal dorsally over the second and third toes, conceivably from bandaging or blistering, correlate with visual inspection. 4. Small focus of metal artifact along the plantar soft tissues of the forefoot below the third metatarsal head, not visible on recent conventional radiographs, probably [name] microscopic metallic particle. He was started on Augmentin for 10-day course. He apparently requested [name] second opinion regarding the osteomyelitis and need for amputation and saw [name] on May 1. Dr. Eliane [name] note is incomplete at this time so it is not clear what was discussed. I do not see any formal vascular studies in the patient's chart. ABI in clinic today was noncompressible, but he has easily palpable distal pulses. Patient History Information obtained from Patient. Allergies [name], [name] ([id number]) [id number] Page 7 of 10 No Known Drug Allergies Family History Diabetes - Mother,Siblings, No family history of Cancer, Heart Disease, Hereditary Spherocytosis, Hypertension, Kidney Disease, Lung Disease, Seizures, Stroke, Thyroid Problems, Tuberculosis. Social History Never smoker, Marital Status - Single, Alcohol Use - Never, Drug Use - No History, Caffeine Use - Rarely. Medical History Cardiovascular Patient has history of Hypertension Endocrine Patient has history of Type II Diabetes Denies history of Type I Diabetes Musculoskeletal Patient has history of Osteoarthritis Neurologic Patient has history of Neuropathy Oncologic Patient has history of Received Radiation Medical [name] Surgical History Notes nd Eyes Diabetic retinopathy Cardiovascular Carotid artery stenosis Oncologic Prostate cancer Review of Systems (ROS) Integumentary  (Skin) Complains or has symptoms of Wounds - Right Great Toe. Objective Constitutional Hypertensive, asymptomatic. No acute distress. Vitals Time Taken: 7:41 AM, Height: 67 in, Weight: 154 lbs, BMI: 24.1, Temperature: 98 F, Pulse: 84 bpm, Respiratory Rate: 16 breaths/min, Blood Pressure: 218/107 mmHg. Ears, Nose, Mouth, and Throat Tympanic membranes completely obscured by cerumen. Cardiovascular 2/6 systolic ejection murmur. General Notes: 03/02/2023: His right great toe is bulbous and misshapen. There is [name] foul odor emanating from it. There is necrotic tissue at the tip, underneath which bone is exposed, protruding from the tip of the toe. There is additional nonviable fat and subcutaneous tissue surrounding the bone. Integumentary (Hair, Skin) Wound #8 status is Open. Original cause of wound was Gradually Appeared. The date acquired was: 02/04/2023. The wound is located on the Right T Great. oe The wound measures 4.2cm length x 3.5cm width x 0.2cm depth; 11.545cm^2 area and 2.309cm^3 volume. There is Fat Layer (Subcutaneous Tissue) exposed. There is no tunneling or undermining noted. There is [name] medium amount of serosanguineous drainage noted. The wound margin is fibrotic, thickened scar. There is small (1-33%) pink granulation within the wound bed. There is [name] large (67-100%) amount of necrotic tissue within the wound bed including Eschar and Adherent Slough. The periwound skin appearance had no abnormalities noted for moisture. The periwound skin appearance had no abnormalities noted for color. The periwound skin appearance exhibited: Callus. Periwound temperature was noted as No Abnormality. Assessment Active Problems ICD-10 Non-pressure chronic ulcer of other part of right foot with necrosis of bone Other acute osteomyelitis, right ankle and foot Type 2 diabetes mellitus with foot ulcer Essential (primary) hypertension Chronic kidney disease, stage 3b Eugenio, Hunner ([id number])  [id number] Page  8 of 10 Procedures Wound #8 Pre-procedure diagnosis of Wound #8 is [name] Diabetic Wound/Ulcer of the Lower Extremity located on the Right T Great .Severity of Tissue Pre Debridement is: oe Fat layer exposed. There was [name] Excisional Skin/Subcutaneous Tissue/Muscle/Bone Debridement with [name] total area of 11.54 sq cm performed by [name] Guess, MD. With the following instrument(s): Curette, Forceps, Rongeur, and Scissors to remove Non-Viable tissue/material. Material removed includes Bone,Fat, Eschar, Subcutaneous Tissue, Slough, and Other: BONE after achieving pain control using Lidocaine 4% T opical Solution. No specimens were taken. [name] time out was conducted at 08:28, prior to the start of the procedure. [name] Minimum amount of bleeding was controlled with Pressure. The procedure was tolerated well with [name] pain level of 0 throughout and [name] pain level of 0 following the procedure. Post Debridement Measurements: 4.2cm length x 3.5cm width x 0.2cm depth; 2.309cm^3 volume. Character of Wound/Ulcer Post Debridement is improved. Severity of Tissue Post Debridement is: Fat layer exposed. Post procedure Diagnosis Wound #8: Same as Pre-Procedure General Notes: Scribed for Dr. Lady [name] by [name]. Plan Follow-up Appointments: Return Appointment in 1 week. - Dr. Lady [name] Room 3 Other: - ++[facility]+++ Anesthetic: (In clinic) Topical Lidocaine 4% applied to wound bed Bathing/ Shower/ Hygiene: May shower and wash wound with soap and water. - After showering dress the wound with [name] new dressing. Edema Control - Lymphedema / SCD / Other: Avoid standing for long periods of time. Exercise regularly Moisturize legs daily. Other Edema Control Orders/Instructions: - When sitting elevate legs at heart level or above heart level Off-Loading: Wound #8 Right T Great: oe Other: - Wear the Darco shoe when standing/walking. Additional Orders / Instructions: Other:  - If you have loose stools get Probiotics and start them. T clean the ears of wax, use equal parts of Hydrogen Peroxide and warm water and use [name] o syringe to put solution in ear to clean the wax out. Hyperbaric Oxygen Therapy: Wound #8 Right T Great: oe Evaluate for HBO Therapy Indication: - Wagner Grade 3 Diabetic Foot Ulcer 2.5 ATA for 90 Minutes with 2 Five (5) Minute Air Breaks T Number of Treatments: - 40 otal One treatments per day (delivered Monday through Friday unless otherwise specified in Special Instructions below): Finger stick Blood Glucose Pre- and Post- HBOT Treatment. Follow Hyperbaric Oxygen Glycemia Protocol Give two 4oz orange juices in addition to Glucerna when the glycemic protocol is used. Afrin (Oxymetazoline HCL) 0.05% nasal spray - 1 spray in both nostrils daily as needed prior to HBO treatment for difficulty clearing ears The following medication(s) was prescribed: amoxicillin-pot clavulanate oral 875 mg-125 mg tablet 1 tab p.o. twice daily x 28 days starting 03/02/2023 doxycycline hyclate oral 100 mg capsule 1 capsule p.o. twice daily x 28 days starting 03/02/2023 WOUND #8: - T Great Wound Laterality: Right oe Cleanser: Normal Saline (DME) (Generic) 1 x Per Day/30 Days Discharge Instructions: Cleanse the wound with Normal Saline prior to applying [name] clean dressing using gauze sponges, not tissue or cotton balls. Cleanser: Soap and Water 1 x Per Day/30 Days Discharge Instructions: May shower and wash wound with dial antibacterial soap and water prior to dressing change. Topical: Mupirocin Ointment (Generic) 1 x Per Day/30 Days Discharge Instructions: Apply Mupirocin (Bactroban) as instructed Prim Dressing: Hydrofera Blue Classic Foam, 4x4 in (DME) (Generic) 1 x Per Day/30 Days ary Discharge Instructions: Moisten with saline prior to applying to wound bed Secondary Dressing: Woven Gauze Sponges 2x2 in (DME) (Generic) 1 x Per Day/30  Days Discharge Instructions: Apply  over primary dressing as directed. Secured With: Insurance underwriter, Sterile 2x75 (in/in) (DME) (Generic) 1 x Per Day/30 Days Discharge Instructions: Secure with stretch gauze as directed. Secured With: Paper T ape, 2x10 (in/yd) (DME) (Generic) 1 x Per Day/30 Days Discharge Instructions: Secure dressing with tape as directed. 03/02/2023: This is [name] 72 year old type II diabetic who was referred for further evaluation and management of [name] Wagner grade 3 diabetic foot ulcer. His right great toe is bulbous and misshapen. There is [name] foul odor emanating from it. There is necrotic tissue at the tip, underneath which bone is exposed, protruding from the tip of the toe. There is additional nonviable fat and subcutaneous tissue surrounding the bone. I used scissors and forceps to debride the eschar from the tip of his toe. I then used [name] rongeur to debride the protruding bone. I used [name] curette to debride the nonviable fat and subcutaneous tissue. We are going to use topical mupirocin with Hydrofera Blue classic. I have prescribed Augmentin and doxycycline for 4-week course to equal [name] total of 6 weeks of antibiotic therapy. He also meets criteria for hyperbaric oxygen therapy and I have proposed [name] course of 40 treatments at 2.5 atm with two 5-minute air breaks. We will work on Scientist, research (physical sciences) for this. He has had an EKG and chest x-ray, both of which are acceptable for treatment. He does have [name] grade 2 systolic ejection murmur, but [name] recent echocardiogram demonstrates [name] normal ejection fraction and no significant aortic stenosis. I am not certain that this toe can be salvaged, but the patient is committed to trying to avoid an amputation. He will follow-up with me in 1 week. Electronic Signature(s) Signed: 03/03/2023 3:59:13 PM By: [name] Guess MD FACS [name], [name] ([id number]) [id number] Page 9 of 10 Signed: 03/03/2023 4:32:52 PM By: [name],  [name] Previous Signature: 03/02/2023 9:29:09 AM Version By: [name] Guess MD FACS Previous Signature: 03/02/2023 9:28:24 AM Version By: [name] Guess MD FACS Entered By: [name] Stall on 03/03/2023 13:52:12 -------------------------------------------------------------------------------- HxROS Details Patient Name: Date of Service: [name], [name] 03/02/2023 7:30 [name] M Medical Record Number: [id number] Patient Account Number: [id number] Date of Birth/Sex: Treating [name]: [birth year] (72 y.o. [name] Primary Care Provider: Abbe [name] Other Clinician: Referring Provider: Treating Provider/Extender: [name] in Treatment: 0 Information Obtained From Patient Integumentary (Skin) Complaints and Symptoms: Positive for: Wounds - Right Great Toe Eyes Medical History: Past Medical History Notes: Diabetic retinopathy Cardiovascular Medical History: Positive for: Hypertension Past Medical History Notes: Carotid artery stenosis Endocrine Medical History: Positive for: Type II Diabetes Negative for: Type I Diabetes Time with diabetes: 5 years Treated with: Insulin Blood sugar tested every day: Yes Tested : twice [name] day Musculoskeletal Medical History: Positive for: Osteoarthritis Neurologic Medical History: Positive for: Neuropathy Oncologic Medical History: Positive for: Received Radiation Past Medical History Notes: Prostate cancer Immunizations Pneumococcal Vaccine: Received Pneumococcal Vaccination: No Implantable Devices None Family and Social History Cancer: No; Diabetes: Yes - Mother,Siblings; Heart Disease: No; Hereditary Spherocytosis: No; Hypertension: No; Kidney Disease: No; Lung Disease: No; Seizures: No; Stroke: No; Thyroid Problems: No; Tuberculosis: No; Never smoker; Marital Status - Single; Alcohol Use: Never; Drug Use: No History; Caffeine Use: Rarely; Financial Concerns: No; Food, Clothing or Shelter Needs: No; Support  System Lacking: No; Transportation Concerns: No [name], [name] ([id number]) [id number] Page 10 of 10 Electronic Signature(s) Signed: 03/02/2023 5:27:15 PM By: Karie Schwalbe [name] Signed: 03/03/2023 7:51:12 AM By:  [name] Guess MD FACS Previous Signature: 03/02/2023 10:02:57 AM Version By: [name] Guess MD FACS Entered By: Karie Schwalbe on 03/02/2023 17:04:41 -------------------------------------------------------------------------------- SuperBill Details Patient Name: Date of Service: [name], [name] 03/02/2023 Medical Record Number: [id number] Patient Account Number: [id number] Date of Birth/Sex: Treating [name]: Feb 27, 1951 (72 y.o. M) Primary Care Provider: Abbe [name] Other Clinician: Referring Provider: Treating Provider/Extender: [name] Weeks in Treatment: 0 Diagnosis Coding ICD-10 Codes Code Description [phone] Non-pressure chronic ulcer of other part of right foot with necrosis of bone M86.171 Other acute osteomyelitis, right ankle and foot E11.621 Type 2 diabetes mellitus with foot ulcer I10 Essential (primary) hypertension N18.32 Chronic kidney disease, stage 3b Facility Procedures : CPT4 Code: [id number] Description: 99214 - WOUND CARE VISIT-LEV 4 EST PT Modifier: 25 Quantity: 1 : CPT4 Code: [id number] Description: 11044 - DEB BONE 20 SQ CM/< ICD-10 Diagnosis Description L97.514 Non-pressure chronic ulcer of other part of right foot with necrosis of bone Modifier: Quantity: 1 Physician Procedures : CPT4: Description Modifier Code [id number] 95284 - WC PHYS LEVEL 4 - NEW PT 25 ICD-10 Diagnosis Description L97.514 Non-pressure chronic ulcer of other part of right foot with necrosis of bone M86.171 Other acute osteomyelitis, right ankle and foot E11.621  Type 2 diabetes mellitus with foot ulcer N18.32 Chronic kidney disease, stage 3b Quantity: 1 : CPT4: 1324401 Debridement; bone (includes epidermis, dermis, subQ  tissue, muscle and/or fascia, if performed) 1st 20 sqcm or less ICD-10 Diagnosis Description L97.514 Non-pressure chronic ulcer of other part of right foot with necrosis of bone Quantity: 1 Electronic Signature(s) Signed: 03/02/2023 5:27:15 PM By: Karie Schwalbe [name] Signed: 03/03/2023 7:51:12 AM By: [name] Guess MD FACS Previous Signature: 03/02/2023 9:29:49 AM Version By: [name] Guess MD FACS Entered By: Karie Schwalbe on 03/02/2023 17:00:45

## 2023-03-09 ENCOUNTER — Encounter (HOSPITAL_BASED_OUTPATIENT_CLINIC_OR_DEPARTMENT_OTHER): Payer: Medicare HMO | Admitting: General Surgery

## 2023-03-09 DIAGNOSIS — M86171 Other acute osteomyelitis, right ankle and foot: Secondary | ICD-10-CM | POA: Diagnosis not present

## 2023-03-09 DIAGNOSIS — L97514 Non-pressure chronic ulcer of other part of right foot with necrosis of bone: Secondary | ICD-10-CM | POA: Diagnosis not present

## 2023-03-09 DIAGNOSIS — N1832 Chronic kidney disease, stage 3b: Secondary | ICD-10-CM | POA: Diagnosis not present

## 2023-03-09 DIAGNOSIS — E1122 Type 2 diabetes mellitus with diabetic chronic kidney disease: Secondary | ICD-10-CM | POA: Diagnosis not present

## 2023-03-09 DIAGNOSIS — M1990 Unspecified osteoarthritis, unspecified site: Secondary | ICD-10-CM | POA: Diagnosis not present

## 2023-03-09 DIAGNOSIS — E11621 Type 2 diabetes mellitus with foot ulcer: Secondary | ICD-10-CM | POA: Diagnosis not present

## 2023-03-09 DIAGNOSIS — L97516 Non-pressure chronic ulcer of other part of right foot with bone involvement without evidence of necrosis: Secondary | ICD-10-CM | POA: Diagnosis not present

## 2023-03-09 DIAGNOSIS — L97512 Non-pressure chronic ulcer of other part of right foot with fat layer exposed: Secondary | ICD-10-CM | POA: Diagnosis not present

## 2023-03-09 DIAGNOSIS — I129 Hypertensive chronic kidney disease with stage 1 through stage 4 chronic kidney disease, or unspecified chronic kidney disease: Secondary | ICD-10-CM | POA: Diagnosis not present

## 2023-03-09 DIAGNOSIS — E1140 Type 2 diabetes mellitus with diabetic neuropathy, unspecified: Secondary | ICD-10-CM | POA: Diagnosis not present

## 2023-03-09 DIAGNOSIS — Z8546 Personal history of malignant neoplasm of prostate: Secondary | ICD-10-CM | POA: Diagnosis not present

## 2023-03-10 ENCOUNTER — Telehealth: Payer: Self-pay

## 2023-03-10 NOTE — Progress Notes (Signed)
Care Management & Coordination Services Pharmacy Team  Reason for Encounter: Appointment Reminder  Contacted patient to confirm in office appointment with Delano Metz, PharmD on 03/11/2023 at 2:30. Spoke with patient on 03/10/2023   Do you have any problems getting your medications? Patient denies  What is your top health concern you would like to discuss at your upcoming visit? Patient denies  Have you seen any other providers since your last visit with PCP? Patient denies  Care Gaps: AWV - completed 05/05/2022 Last BP - 188/95 on 02/12/2023 Last A1C  - 7.9 on 11/26/2022 Last eye exam - 01/11/2020(request sent to [facility] to schedule 04/23/2023 [place]) Last foot exam - 03/09/2023 Tdap - never done Shingrix - never done Colonoscopy - never done Pneumovax - overdue Covid - overdue  Star Rating Drugs: Atorvastatin 40 mg - last filled 12/15/2022 [facility]  Inetta Fermo [facility]  Clinical Pharmacist Assistant [phone]

## 2023-03-11 ENCOUNTER — Ambulatory Visit: Payer: Medicare HMO

## 2023-03-11 NOTE — Progress Notes (Signed)
Care Management & Coordination Services Pharmacy Note  03/11/2023 Name:  [name] MRN:  [id number] DOB:  1951-09-22     Focused BP Visit and Med Packaging Review  Summary: -Still has plavix and has been splitting tabs due to dizziness (was suppose to only last 21 days and dispensed end of march) -Has all active medications on his list and none that he is not to be taking anymore (huge improvement from prior visits) -BP continues to fluctuate, is checking at home daily  Recommendations/Changes made from today's visit: -Counseled on med adherence extensively and taking medications as prescribed -Counseled to check BP twice daily and keep a log, and record if readings are pre-med or post-med  Follow up plan: Dispensing/BP/DM review call monthly -Pharmacist visit in 3 months .    Sherrill Raring Clinical Pharmacist [phone]

## 2023-03-15 IMAGING — US US RENAL ARTERY STENOSIS
1 series · 13 of 25 positions shown · non-contrast
Comparison: None.

CLINICAL DATA: 69-year-old male with essential hypertension

EXAM:
RENAL/URINARY TRACT ULTRASOUND
RENAL DUPLEX DOPPLER ULTRASOUND

[Series 1: us renal artery stenosis · 0.23mm/px · 13 of 97 slices shown]
[im 1/97]
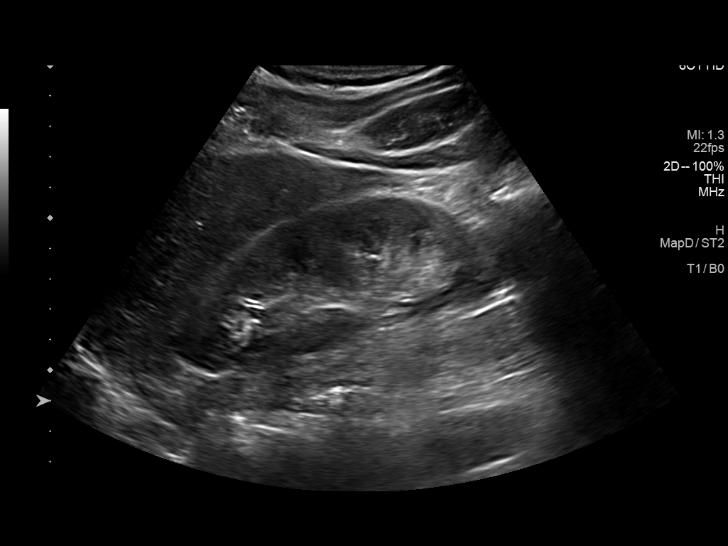
[im 9/97]
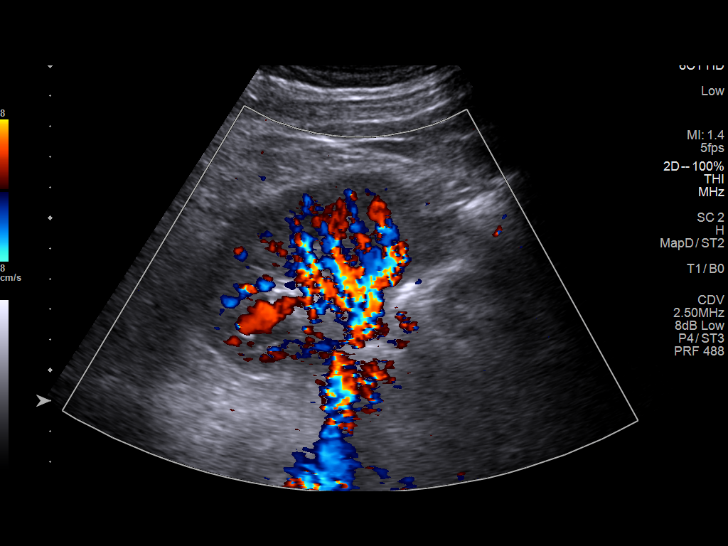
[im 17/97]
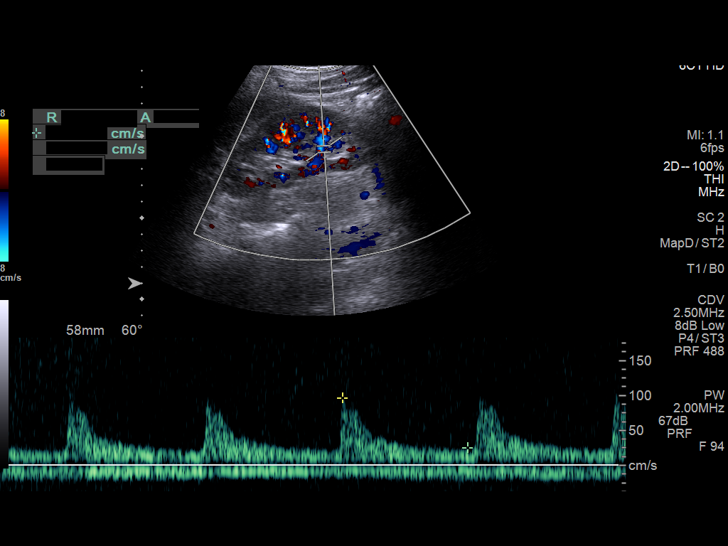
[im 25/97]
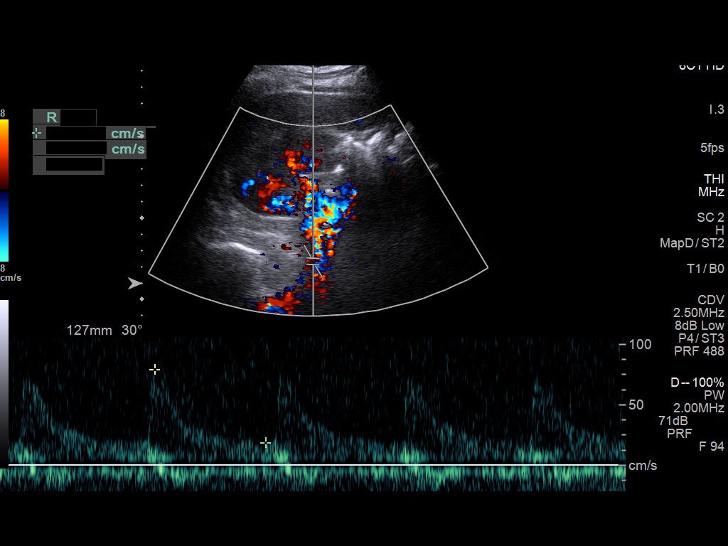
[im 33/97]
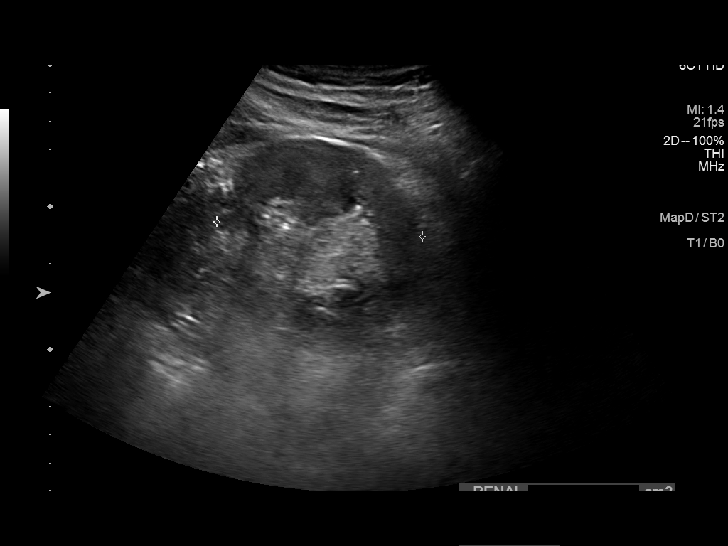
[im 41/97]
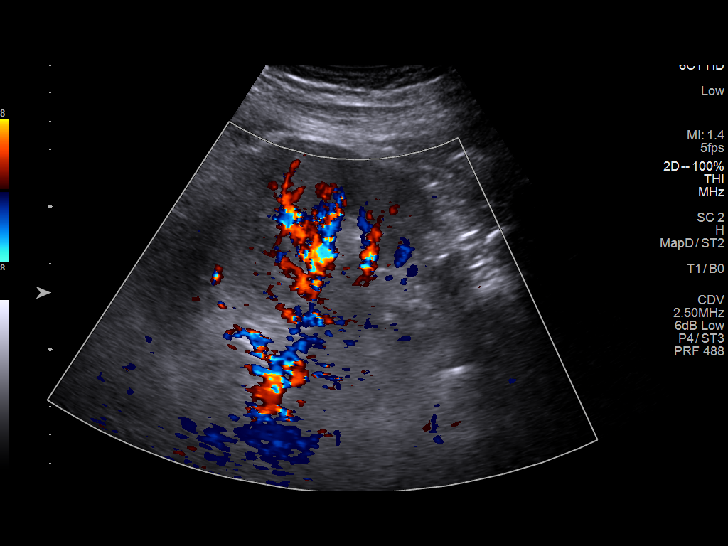
[im 49/97]
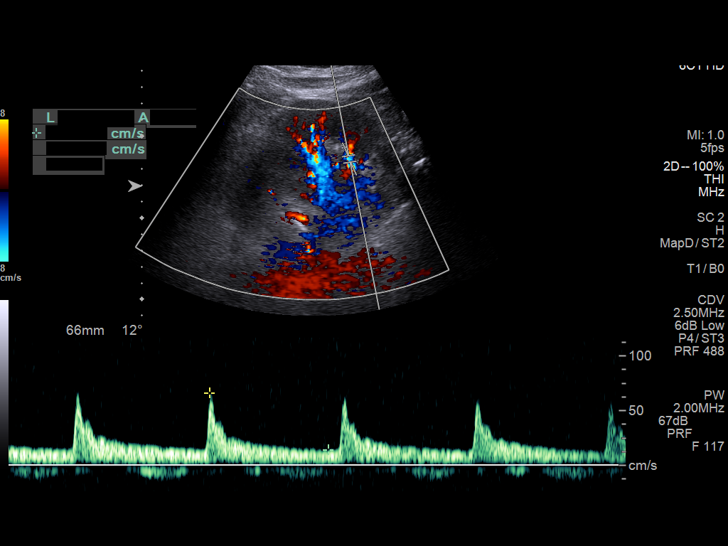
[im 57/97]
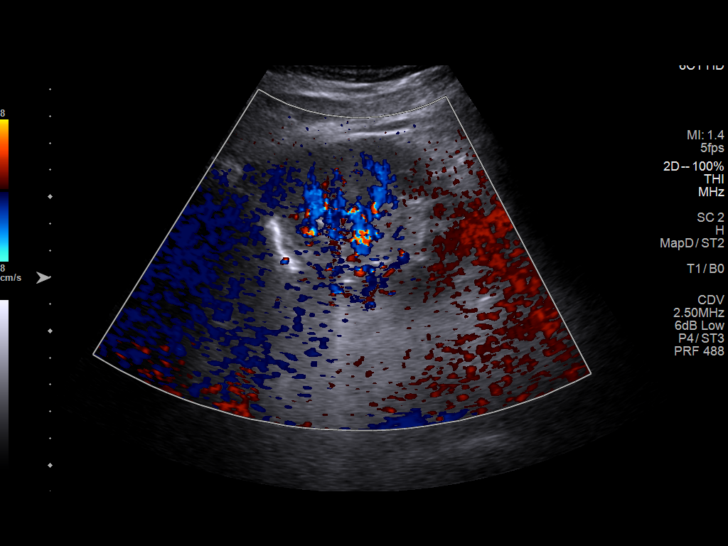
[im 65/97]
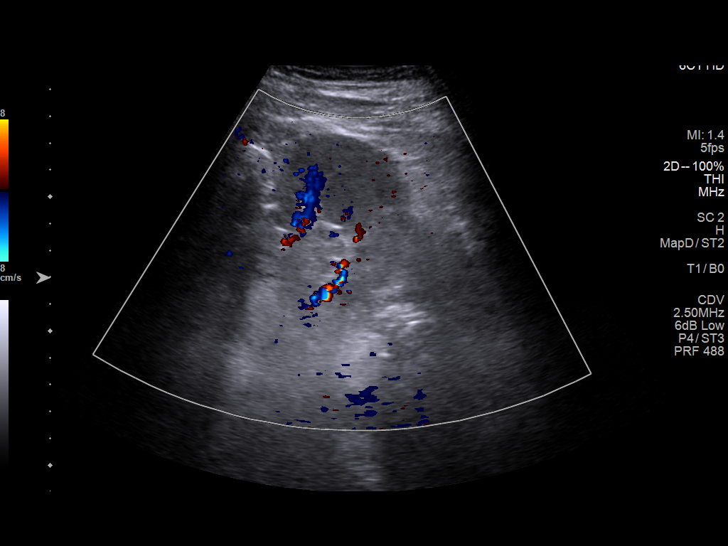
[im 73/97]
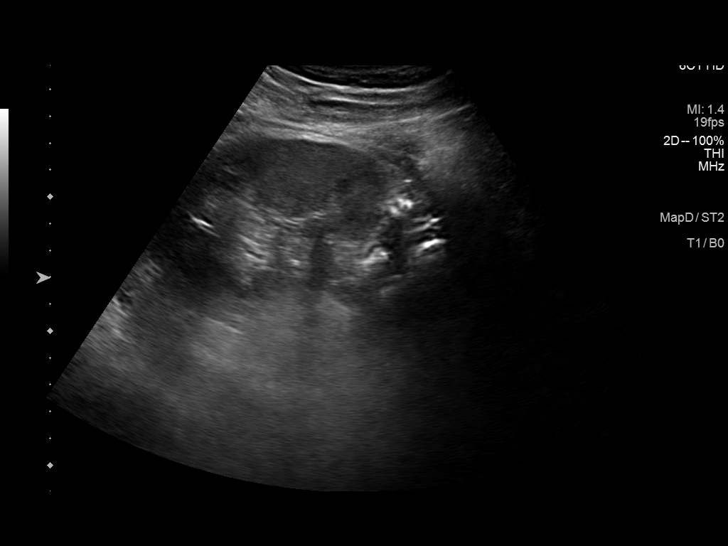
[im 81/97]
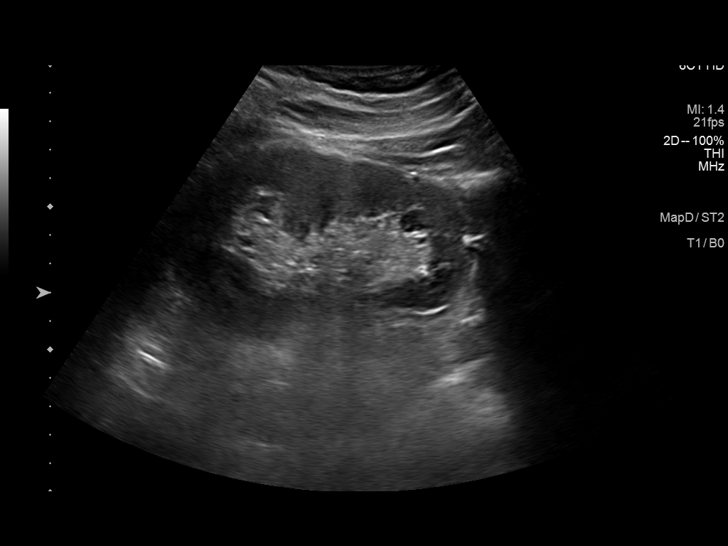
[im 89/97]
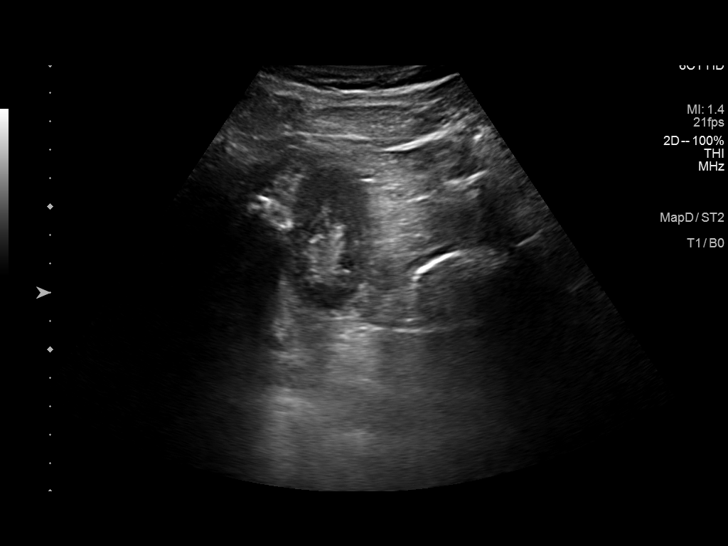
[im 97/97]
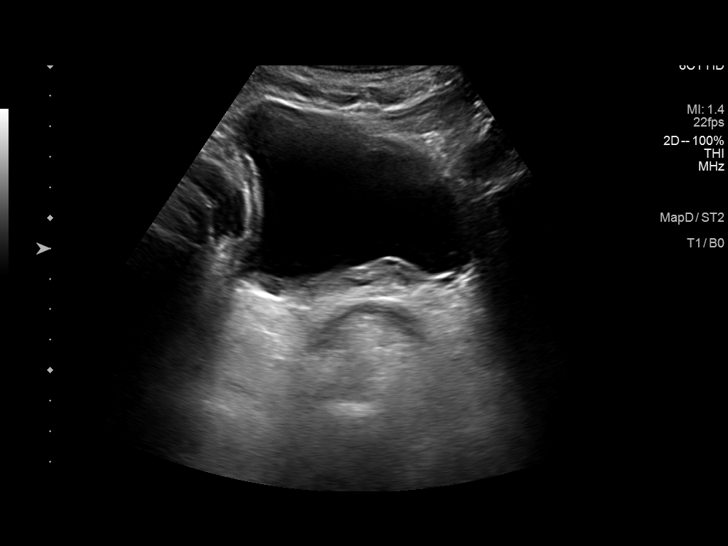

[13 of 25 positions shown; findings below may reference images not displayed]

FINDINGS: Right Kidney:

Length: 11.3 x 4.1 x 5.8 cm for a total volume of 141 mL.
Echogenicity within normal limits. No mass or hydronephrosis
visualized.

Left Kidney:

Length: 11.2 x 5.9 x 7.2 cm for a total volume of 248 mL.
Echogenicity within normal limits. No mass or hydronephrosis
visualized. Prominent dromedary hump.

Bladder: Markedly thickened bladder wall. The thickening is
symmetric. No focal thickening or mass. Small amount of debris
present layering within the bladder lumen.

RENAL DUPLEX ULTRASOUND

Right Renal Artery Velocities:

Origin:  79 cm/sec

Mid:  90 cm/sec

Hilum:  61 cm/sec

Interlobar:  72 cm/sec

Arcuate:  28 cm/sec

Left Renal Artery Velocities:

Origin:  85 cm/sec

Mid:  69 cm/sec

Hilum:  72 cm/sec

Interlobar:  70 cm/sec

Arcuate:  48 cm/sec

Aortic Velocity:  106 cm/sec

Right Renal-Aortic Ratios:

Origin:

Mid:

Hilum:

Interlobar:

Arcuate:

Left Renal-Aortic Ratios:

Origin:

Mid:

Hilum:

Interlobar:

Arcuate:
IMPRESSION: 1. No evidence of renal artery stenosis.
2. Normal sonographic appearance of the kidneys. No hydronephrosis
or nephrolithiasis.
3. Diffusely thickened bladder wall with evidence of debris layering
dependently in the bladder lumen. Differential considerations
include chronic infectious or inflammatory cystitis. Bladder
neoplasm is considered somewhat less likely given the absence of
focality. Consider further evaluation with urinalysis.

## 2023-03-17 ENCOUNTER — Encounter (HOSPITAL_BASED_OUTPATIENT_CLINIC_OR_DEPARTMENT_OTHER): Payer: Medicare HMO | Admitting: General Surgery

## 2023-03-17 DIAGNOSIS — M1990 Unspecified osteoarthritis, unspecified site: Secondary | ICD-10-CM | POA: Diagnosis not present

## 2023-03-17 DIAGNOSIS — Z8546 Personal history of malignant neoplasm of prostate: Secondary | ICD-10-CM | POA: Diagnosis not present

## 2023-03-17 DIAGNOSIS — E11621 Type 2 diabetes mellitus with foot ulcer: Secondary | ICD-10-CM | POA: Diagnosis not present

## 2023-03-17 DIAGNOSIS — N1832 Chronic kidney disease, stage 3b: Secondary | ICD-10-CM | POA: Diagnosis not present

## 2023-03-17 DIAGNOSIS — M86171 Other acute osteomyelitis, right ankle and foot: Secondary | ICD-10-CM | POA: Diagnosis not present

## 2023-03-17 DIAGNOSIS — E1122 Type 2 diabetes mellitus with diabetic chronic kidney disease: Secondary | ICD-10-CM | POA: Diagnosis not present

## 2023-03-17 DIAGNOSIS — E1140 Type 2 diabetes mellitus with diabetic neuropathy, unspecified: Secondary | ICD-10-CM | POA: Diagnosis not present

## 2023-03-17 DIAGNOSIS — L97514 Non-pressure chronic ulcer of other part of right foot with necrosis of bone: Secondary | ICD-10-CM | POA: Diagnosis not present

## 2023-03-17 DIAGNOSIS — I129 Hypertensive chronic kidney disease with stage 1 through stage 4 chronic kidney disease, or unspecified chronic kidney disease: Secondary | ICD-10-CM | POA: Diagnosis not present

## 2023-03-17 DIAGNOSIS — L97512 Non-pressure chronic ulcer of other part of right foot with fat layer exposed: Secondary | ICD-10-CM | POA: Diagnosis not present

## 2023-03-20 ENCOUNTER — Telehealth: Payer: Self-pay

## 2023-03-20 NOTE — Progress Notes (Signed)
Care Management & Coordination Services Pharmacy Team  Reason for Encounter: Medication coordination and delivery  Contacted patient to discuss medications and coordinate delivery from [facility]. Spoke with patient on 03/20/2023   Cycle dispensing form sent to [facility] for review.   Last adherence delivery date: 03/04/2023 ([facility])     Patient is due for next adherence delivery on: 04/01/2023  This delivery to include: Adherence Packaging  30 Days  Amlodipine 10 mg - 1 tablet at breakfast Carvedilol 25 mg - 1 tablet at breakfast and dinner Hydralazine 50 mg  - 1 tablet at breakfast and dinner Ferrous Gluconate 324 mg - 1 tablet at breakfast Doxazosin 8 mg - 1 tablet at bedtime Aspirin 81 mg - 1 tablet at breakfast  Patient declined the following medications this month: None  Confirmed delivery date of 04/01/2023, advised patient that pharmacy will contact them the morning of delivery.  Any concerns about your medications? Patient denies  How often do you forget or accidentally miss a dose? Patient denies  Is patient in packaging Yes  If yes  What is the date on your next pill pack? 03/20/2023  Any concerns or issues with your packaging? Patient denies   Recent blood pressure readings are as follows: Patient declines, not at home  Recent blood glucose readings are as follows: Patient declines, not at home   Chart review: Recent office visits:  None  Recent consult visits:  None  Hospital visits:  None  Medications: Outpatient Encounter Medications as of 03/20/2023  Medication Sig   amLODipine (NORVASC) 10 MG tablet Take 1 tablet (10 mg total) by mouth daily.   amoxicillin-clavulanate (AUGMENTIN) 875-125 MG tablet Take 1 tablet by mouth 2 (two) times daily.   aspirin EC 81 MG tablet Take 1 tablet (81 mg total) by mouth daily. Swallow whole.   atorvastatin (LIPITOR) 80 MG tablet Take 1 tablet (80 mg total) by mouth daily.   blood glucose  meter kit and supplies Dispense based on patient and insurance preference. Use to check glucose daily (FOR ICD-10 E10.9, E11.9).   carvedilol (COREG) 25 MG tablet Take 1 tablet (25 mg total) by mouth 2 (two) times daily.   clopidogrel (PLAVIX) 75 MG tablet Take 1 tablet (75 mg total) by mouth daily.   Continuous Glucose Sensor (DEXCOM G7 SENSOR) MISC Use to check blood sugars continuously.   doxazosin (CARDURA) 8 MG tablet Take 1 tablet (8 mg total) by mouth at bedtime.   ferrous gluconate (FERGON) 324 MG tablet Take 1 tablet (324 mg total) by mouth daily with breakfast.   hydrALAZINE (APRESOLINE) 50 MG tablet Take 1 tablet (50 mg total) by mouth in the morning and at bedtime.   insulin isophane & regular human KwikPen (NOVOLIN 70/30 KWIKPEN) (70-30) 100 UNIT/ML KwikPen Inject 20 Units into the skin daily before breakfast AND 24 Units at bedtime.   Insulin Pen Needle (BD PEN NEEDLE NANO 2ND GEN) 32G X 4 MM MISC To use with insulin pen twice a day.   Lancet Devices (ONE TOUCH DELICA LANCING DEV) MISC 1 Device by Does not apply route as directed.   latanoprost (XALATAN) 0.005 % ophthalmic solution SMARTSIG:In Eye(s)   OneTouch Delica Lancets 30G MISC 1 Device by Does not apply route as directed. Use to test up to 4x daily.   ONETOUCH ULTRA test strip USE UP TO 4X DAILY   No facility-administered encounter medications on file as of 03/20/2023.   BP Readings from Last 3 Encounters:  02/12/23 Marland Kitchen)  188/95  02/09/23 112/61  02/08/23 (!) 194/98    Pulse Readings from Last 3 Encounters:  02/12/23 78  02/08/23 89  01/23/23 78    Lab Results  Component Value Date/Time   HGBA1C 7.9 (H) 11/26/2022 11:26 AM   HGBA1C 7.9 (A) 11/26/2022 10:32 AM   HGBA1C 6.5 (A) 06/12/2022 09:44 AM   HGBA1C 7.3 (H) 03/20/2021 03:05 PM   Lab Results  Component Value Date   CREATININE 2.14 (H) 02/08/2023   BUN 34 (H) 02/08/2023   GFR 32.61 (L) 11/26/2022   GFRNONAA 32 (L) 02/08/2023   GFRAA 62 09/07/2020   NA 142  02/08/2023   K 3.9 02/08/2023   CALCIUM 9.2 02/08/2023   CO2 25 02/08/2023    Inetta Fermo CMA  Clinical Pharmacist Assistant [phone]

## 2023-03-26 ENCOUNTER — Encounter (HOSPITAL_BASED_OUTPATIENT_CLINIC_OR_DEPARTMENT_OTHER): Payer: Medicare HMO | Admitting: General Surgery

## 2023-04-02 ENCOUNTER — Encounter (HOSPITAL_BASED_OUTPATIENT_CLINIC_OR_DEPARTMENT_OTHER): Payer: Medicare HMO | Attending: General Surgery | Admitting: General Surgery

## 2023-04-02 DIAGNOSIS — L97514 Non-pressure chronic ulcer of other part of right foot with necrosis of bone: Secondary | ICD-10-CM | POA: Diagnosis not present

## 2023-04-02 DIAGNOSIS — E1122 Type 2 diabetes mellitus with diabetic chronic kidney disease: Secondary | ICD-10-CM | POA: Insufficient documentation

## 2023-04-02 DIAGNOSIS — E11622 Type 2 diabetes mellitus with other skin ulcer: Secondary | ICD-10-CM | POA: Diagnosis not present

## 2023-04-02 DIAGNOSIS — E11621 Type 2 diabetes mellitus with foot ulcer: Secondary | ICD-10-CM | POA: Diagnosis not present

## 2023-04-02 DIAGNOSIS — M86171 Other acute osteomyelitis, right ankle and foot: Secondary | ICD-10-CM | POA: Insufficient documentation

## 2023-04-02 DIAGNOSIS — I129 Hypertensive chronic kidney disease with stage 1 through stage 4 chronic kidney disease, or unspecified chronic kidney disease: Secondary | ICD-10-CM | POA: Insufficient documentation

## 2023-04-02 DIAGNOSIS — N1832 Chronic kidney disease, stage 3b: Secondary | ICD-10-CM | POA: Insufficient documentation

## 2023-04-06 ENCOUNTER — Encounter (HOSPITAL_BASED_OUTPATIENT_CLINIC_OR_DEPARTMENT_OTHER): Payer: Medicare HMO | Admitting: General Surgery

## 2023-04-06 DIAGNOSIS — E11621 Type 2 diabetes mellitus with foot ulcer: Secondary | ICD-10-CM | POA: Diagnosis not present

## 2023-04-06 DIAGNOSIS — M86171 Other acute osteomyelitis, right ankle and foot: Secondary | ICD-10-CM | POA: Diagnosis not present

## 2023-04-06 DIAGNOSIS — N1832 Chronic kidney disease, stage 3b: Secondary | ICD-10-CM | POA: Diagnosis not present

## 2023-04-06 DIAGNOSIS — I129 Hypertensive chronic kidney disease with stage 1 through stage 4 chronic kidney disease, or unspecified chronic kidney disease: Secondary | ICD-10-CM | POA: Diagnosis not present

## 2023-04-06 DIAGNOSIS — E1122 Type 2 diabetes mellitus with diabetic chronic kidney disease: Secondary | ICD-10-CM | POA: Diagnosis not present

## 2023-04-06 DIAGNOSIS — L97514 Non-pressure chronic ulcer of other part of right foot with necrosis of bone: Secondary | ICD-10-CM | POA: Diagnosis not present

## 2023-04-06 LAB — GLUCOSE, CAPILLARY
Glucose-Capillary: 197 mg/dL — ABNORMAL HIGH (ref 70–99)
Glucose-Capillary: 40 mg/dL — CL (ref 70–99)
Glucose-Capillary: 54 mg/dL — ABNORMAL LOW (ref 70–99)
Glucose-Capillary: 79 mg/dL (ref 70–99)
Glucose-Capillary: 110 mg/dL — ABNORMAL HIGH (ref 70–99)
Glucose-Capillary: 141 mg/dL — ABNORMAL HIGH (ref 70–99)
Glucose-Capillary: 99 mg/dL (ref 70–99)

## 2023-04-06 NOTE — Progress Notes (Signed)
DELMAN, GOSHORN ([id number]) [id number] Page 1 of 2 Visit Report for 04/06/2023 Problem List Details Patient Name: Date of Service: [name] 04/06/2023 8:00 [name] M Medical Record Number: [id number] Patient Account Number: [id number] Date of Birth/Sex: Treating RN: [birth year] (72 y.o. 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 03/02/2023 No Yes necrosis of bone L97.512 Non-pressure chronic ulcer of other part of right foot with fat 03/09/2023 No Yes layer exposed M86.171 Other acute osteomyelitis, right ankle and foot 03/02/2023 No Yes E11.621 Type 2 diabetes mellitus with foot ulcer 03/02/2023 No Yes I10 Essential (primary) hypertension 03/02/2023 No Yes N18.32 Chronic kidney disease, stage 3b 03/02/2023 No Yes Inactive Problems Resolved Problems Electronic Signature(s) Signed: 04/06/2023 3:21:18 PM By: [name] EMT Signed: 04/06/2023 3:53:23 PM By: [name] MD FACS Entered By: [name] on 04/06/2023 15:21:18 -------------------------------------------------------------------------------- SuperBill Details Patient Name: Date of Service: [name], [name] 04/06/2023 Medical Record Number: [id number] Patient Account Number: [id number] Date of Birth/Sex: Treating RN: 08-28-1951 (72 y.o. [name] Primary Care Provider: [name] Other Clinician: [name] Referring Provider: Treating Provider/Extender: [name]: 5 Diagnosis Coding ICD-10 Codes Code Description [phone] Non-pressure chronic ulcer of other part of right foot with necrosis of bone L97.512 Non-pressure chronic ulcer of other part  of right foot with fat layer exposed Harrington, Richardson ([id number]) [id number] Page 2 of 2 M86.171 Other acute osteomyelitis, right ankle and foot E11.621 Type 2 diabetes mellitus with foot ulcer I10 Essential (primary) hypertension N18.32 Chronic kidney disease, stage 3b Facility Procedures : CPT4 Code Description: [id number] G0277-(Facility Use Only) HBOT full body chamber, , ICD-10 Diagnosis Description L97.514 Non-pressure chronic ulcer of other part of right foot M86.171 Other acute osteomyelitis, right ankle and foot L97.512  Non-pressure chronic ulcer of other part of right foot E11.621 Type 2 diabetes mellitus with foot ulcer Modifier: with necrosis of with fat layer e Quantity: 3 bone xposed Physician Procedures : CPT4 Code Description Modifier [id number] - WC PHYS HYPERBARIC OXYGEN THERAPY ICD-10 Diagnosis Description L97.514 Non-pressure chronic ulcer of other part of right foot with necrosis of M86.171 Other acute osteomyelitis, right ankle and foot  L97.512 Non-pressure chronic ulcer of other part of right foot with fat layer e E11.621 Type 2 diabetes mellitus with foot ulcer Quantity: 1 bone xposed Electronic Signature(s) Signed: 04/06/2023 3:21:10 PM By: [name] EMT Signed: 04/06/2023 3:53:23 PM By: [name] MD FACS Entered By: [name] on 04/06/2023 15:21:10

## 2023-04-06 NOTE — Progress Notes (Signed)
[place], [name] ([id number]) [id number] Page 1 of 3 Visit Report for 04/06/2023 HBO Details Patient Name: Date of Service: [name] 04/06/2023 8:00 [name] M Medical Record Number: [id number] Patient Account Number: [id number] Date of Birth/Sex: Treating RN: [birth year] (72 y.o. [name], [name] Primary Care Haevyn Ury: [name] Other Clinician: [name] Referring Debralee Braaksma: Treating Makilah Dowda/Extender: [name] in Treatment: 5 HBO Treatment Course Details Treatment Course Number: 1 Ordering Derrion Tritz: [name] T Treatments Ordered: otal 40 HBO Treatment Start Date: 04/06/2023 HBO Indication: Diabetic Ulcer(s) of the Lower Extremity Standard/Conservative Wound Care tried and failed greater than or equal to 30 days Wound #8 Right T Great oe HBO Treatment Details Treatment Number: 1 Patient Type: Outpatient Chamber Type: Monoplace Chamber Serial #: [id number] Treatment Protocol: 2.5 ATA with 90 minutes oxygen, with two 5 minute air breaks Treatment Details Compression Rate Down: 1.0 psi / minute De-Compression Rate Up: 2.5 psi / minute [name] breaks and ir Compress Tx Pressure breathing periods Decompress Decompress Begins Reached (leave unused spaces Begins Ends blank) Chamber Pressure (ATA 1 2.5 2.5 2.5 2.5 2.5 - - 2.5 1 ) Clock Time (24 hr) 08:34 08:55 09:25 09:30 - - - - 09:58 10:11 Treatment Length: 97 (minutes) Treatment Segments: 3 Vital Signs Capillary Blood Glucose Reference Range: 80 - 120 mg / dl HBO Diabetic Blood Glucose Intervention Range: <131 mg/dl or >324 mg/dl Time Vitals Blood Respiratory Capillary Blood Glucose Pulse Action Type: Pulse: Temperature: Taken: Pressure: Rate: Glucose (mg/dl): Meter #: Oximetry (%) Taken: Pre 08:03 176/90 79 18 98.1 197 Post 10:13 195/98 84 20 98.1 40 Post 10:40 54 Post 11:07 79 Post 11:10 157/89 72 16 89 Post 11:39 99 Post 12:05 110/80 72 18 110 Post  12:26 123/75 68 18 141 Treatment Response Treatment Toleration: Fair Adverse Events: 1:Hypoglycemia without seizure Treatment Completion Status: Treatment Aborted/Not Restarted Reason: Adverse Event Treatment Notes Today was the patient first treatment session. The patient was seen by Dr. Lady Gary before the treatment was started. Treatment was started at 0834 at [name] travel rate of 1.0 PSI/min. No problems noted during compression. At [phone] I noted that the patient was not responding appropriately. Speach was slurred. The patient destress alarm was pulled. The patient was able to put on air mask. Dr. Lady Gary, Karen Kitchens RN, Bonita Quin RN and T aylor RN arrived to chamber side. Dr. Lady Gary ordered decompression at rate of 2.5 PSI/min. Door open at 1011. At 1013 Blood Sugar was taken, Results of 40. The patient was alert to person and place. The patient was given 1 Glucose tablet. At 1016 patient was given 4 oz of orange juice with 2 pack of peanut butter and 8 oz Glucerna. At 1040 Blood sugar was 54. Patient was given 4 oz Orange juice with 1 pk of peanut butter. At 1107 Blood sugar was 79. Patient given 8 oz of Glucerna. At 1139 Blood sugar was 99. Patient given 4oz orange juice with 1 pack peanut butter. At 1155 The patient was given [name] food tray. (Chicken breast, cream potatoes and carrots with apple juice). He ate 50% of tray. At 1205 Blood sugar was 110. The patient stated that he felt better. The patient changed out of HBO scrubs. At 1226 Blood sugar was 141. The patient was discharged home. [name], [name] ([id number]) [id number] Page 2 of 3 Additional Procedure Documentation Tissue Sevierity: Necrosis of bone Physician HBO Attestation: I certify that I supervised this HBO treatment in accordance with Medicare guidelines. [name] trained emergency response team is  readily available per Yes hospital policies and procedures. Continue HBOT as ordered. Yes Electronic  Signature(s) Signed: 04/06/2023 3:54:36 PM By: [name] MD FACS Previous Signature: 04/06/2023 3:19:01 PM Version By: [name] EMT Entered By: [name] on 04/06/2023 15:54:35 -------------------------------------------------------------------------------- HBO Safety Checklist Details Patient Name: Date of Service: [name], [name] 04/06/2023 8:00 [name] M Medical Record Number: [id number] Patient Account Number: [id number] Date of Birth/Sex: Treating RN: 13-Nov-1950 (72 y.o. [name], [name] Primary Care Jayvien Rowlette: [name] Other Clinician: [name] Referring Anwar Crill: Treating [name]/Extender: [name]: 5 HBO Safety Checklist Items Safety Checklist Consent Form Signed Patient voided / foley secured and emptied When did you last eato Last night Last dose of injectable or oral agent Last Night Ostomy pouch emptied and vented if applicable NA All implantable devices assessed, documented and approved NA Intravenous access site secured and place NA Valuables secured Linens and cotton and cotton/polyester blend (less than 51% polyester) Personal oil-based products / skin lotions / body lotions removed Wigs or hairpieces removed NA Smoking or tobacco materials removed Books / newspapers / magazines / loose paper removed Cologne, aftershave, perfume and deodorant removed Jewelry removed (may wrap wedding band) Make-up removed NA Hair care products removed Battery operated devices (external) removed Heating patches and chemical warmers removed Titanium eyewear removed NA Nail polish cured greater than 10 hours NA Casting material cured greater than 10 hours NA Hearing aids removed NA Loose dentures or partials removed removed by patient Prosthetics have been removed NA Patient demonstrates correct use of air break device (if applicable) Patient concerns have been addressed Patient grounding bracelet on  and cord attached to chamber Specifics for Inpatients (complete in addition to above) Medication sheet sent with patient NA Intravenous medications needed or due during therapy sent with patient NA Drainage tubes (e.g. nasogastric tube or chest tube secured and vented) NA Endotracheal or Tracheotomy tube secured NA Cuff deflated of air and inflated with saline NA Airway suctioned NA Notes The safety checklist was done before the treatment was started. Electronic Signature(s) [name], [name] ([id number]) [id number] Page 3 of 3 Signed: 04/06/2023 2:35:40 PM By: [name] EMT Entered By: [name] on 04/06/2023 14:35:40

## 2023-04-07 ENCOUNTER — Encounter (HOSPITAL_BASED_OUTPATIENT_CLINIC_OR_DEPARTMENT_OTHER): Payer: Medicare HMO | Admitting: General Surgery

## 2023-04-07 DIAGNOSIS — E1122 Type 2 diabetes mellitus with diabetic chronic kidney disease: Secondary | ICD-10-CM | POA: Diagnosis not present

## 2023-04-07 DIAGNOSIS — I129 Hypertensive chronic kidney disease with stage 1 through stage 4 chronic kidney disease, or unspecified chronic kidney disease: Secondary | ICD-10-CM | POA: Diagnosis not present

## 2023-04-07 DIAGNOSIS — E11621 Type 2 diabetes mellitus with foot ulcer: Secondary | ICD-10-CM | POA: Diagnosis not present

## 2023-04-07 DIAGNOSIS — M86171 Other acute osteomyelitis, right ankle and foot: Secondary | ICD-10-CM | POA: Diagnosis not present

## 2023-04-07 DIAGNOSIS — L97514 Non-pressure chronic ulcer of other part of right foot with necrosis of bone: Secondary | ICD-10-CM | POA: Diagnosis not present

## 2023-04-07 DIAGNOSIS — N1832 Chronic kidney disease, stage 3b: Secondary | ICD-10-CM | POA: Diagnosis not present

## 2023-04-07 LAB — GLUCOSE, CAPILLARY
Glucose-Capillary: 225 mg/dL — ABNORMAL HIGH (ref 70–99)
Glucose-Capillary: 231 mg/dL — ABNORMAL HIGH (ref 70–99)

## 2023-04-07 NOTE — Progress Notes (Signed)
[place], [name] [name] ([id number]) [id number] Page 1 of 2 Visit Report for 04/07/2023 Arrival Information Details Patient Name: Date of Service: [name] [name] 04/07/2023 8:00 A M Medical Record Number: [id number] Patient Account Number: [id number] Date of Birth/Sex: Treating RN: [birth year] [name] (72 y.o. [name] [name] Primary Care [name] [name]: [name] [name] Other Clinician: Karl [name] Referring [name] [name]: Treating [name] [name]/Extender: [name] [name] in Treatment: 5 Visit Information History Since Last Visit All ordered tests and consults were completed: Yes Patient Arrived: Ambulatory Added or deleted any medications: No Arrival Time: 08:07 Any new allergies or adverse reactions: No Accompanied By: None Had a fall or experienced change in No Transfer Assistance: None activities of daily living that may affect Patient Identification Verified: Yes risk of falls: Secondary Verification Process Completed: Yes Signs or symptoms of abuse/neglect since last visito No Patient Requires Transmission-Based Precautions: No Hospitalized since last visit: No Patient Has Alerts: Yes Implantable device outside of the clinic excluding No Patient Alerts: Patient on Blood Thinner cellular tissue based products placed in the center Plavix since last visit: ABI R [place] Pain Present Now: No Electronic Signature(s) Signed: 04/07/2023 2:39:45 PM By: [name] [name] EMT Entered By: [name] [name] on 04/07/2023 14:39:45 -------------------------------------------------------------------------------- Encounter Discharge Information Details Patient Name: Date of Service: [name], [name] 04/07/2023 8:00 A M Medical Record Number: [id number] Patient Account Number: [id number] Date of Birth/Sex: Treating RN: [name] [name] (72 y.o. [name] [name] Primary Care Lawren Sexson: [name] [name] Other Clinician: Karl [name] Referring Terre Zabriskie: Treating  Wealthy Danielski/Extender: [name] [name] in Treatment: 5 Encounter Discharge Information Items Discharge Condition: Stable Ambulatory Status: Ambulatory Discharge Destination: Home Transportation: Private Auto Accompanied By: none Schedule Follow-up Appointment: Yes Clinical Summary of Care: Electronic Signature(s) Signed: 04/07/2023 2:55:00 PM By: [name] [name] EMT Entered By: [name] [name] on 04/07/2023 14:54:59 -------------------------------------------------------------------------------- Vitals Details Patient Name: Date of Service: [name], [name] 04/07/2023 8:00 A M Medical Record Number: [id number] Patient Account Number: [id number] Date of Birth/Sex: Treating RN: [name] (72 y.o. [name] [name] Primary Care Stephene Alegria: [name] [name] Other Clinician: Karl [name] Referring Cayleb Jarnigan: Treating Geana Walts/Extender: [name]: 5 Vital Signs Time Taken: 08:17 Temperature (F): 97.7 [name], [name] [name] ([id number]) [id number] Page 2 of 2 Height (in): 67 Pulse (bpm): 70 Weight (lbs): 154 Respiratory Rate (breaths/min): 18 Body Mass Index (BMI): 24.1 Blood Pressure (mmHg): 171/78 Capillary Blood Glucose (mg/dl): 433 Reference Range: 80 - 120 mg / dl Electronic Signature(s) Signed: 04/07/2023 2:47:01 PM By: [name] [name] EMT Entered By: [name] [name] on 04/07/2023 14:47:01

## 2023-04-08 ENCOUNTER — Encounter (HOSPITAL_BASED_OUTPATIENT_CLINIC_OR_DEPARTMENT_OTHER): Payer: Medicare HMO | Admitting: General Surgery

## 2023-04-08 DIAGNOSIS — N1832 Chronic kidney disease, stage 3b: Secondary | ICD-10-CM | POA: Diagnosis not present

## 2023-04-08 DIAGNOSIS — M86171 Other acute osteomyelitis, right ankle and foot: Secondary | ICD-10-CM | POA: Diagnosis not present

## 2023-04-08 DIAGNOSIS — I129 Hypertensive chronic kidney disease with stage 1 through stage 4 chronic kidney disease, or unspecified chronic kidney disease: Secondary | ICD-10-CM | POA: Diagnosis not present

## 2023-04-08 DIAGNOSIS — E11621 Type 2 diabetes mellitus with foot ulcer: Secondary | ICD-10-CM | POA: Diagnosis not present

## 2023-04-08 DIAGNOSIS — L97514 Non-pressure chronic ulcer of other part of right foot with necrosis of bone: Secondary | ICD-10-CM | POA: Diagnosis not present

## 2023-04-08 DIAGNOSIS — E1122 Type 2 diabetes mellitus with diabetic chronic kidney disease: Secondary | ICD-10-CM | POA: Diagnosis not present

## 2023-04-08 LAB — GLUCOSE, CAPILLARY
Glucose-Capillary: 204 mg/dL — ABNORMAL HIGH (ref 70–99)
Glucose-Capillary: 56 mg/dL — ABNORMAL LOW (ref 70–99)
Glucose-Capillary: 71 mg/dL (ref 70–99)

## 2023-04-08 NOTE — Progress Notes (Signed)
DEMETREUS, LOTHAMER ([id number]) [id number] Page 1 of 2 Visit Report for 04/07/2023 Problem List Details Patient Name: Date of Service: [name] 04/07/2023 8:00 A M Medical Record Number: [id number] Patient Account Number: [id number] Date of Birth/Sex: Treating RN: 05/11/1951 (72 y.o. 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 03/02/2023 No Yes necrosis of bone L97.512 Non-pressure chronic ulcer of other part of right foot with fat 03/09/2023 No Yes layer exposed M86.171 Other acute osteomyelitis, right ankle and foot 03/02/2023 No Yes E11.621 Type 2 diabetes mellitus with foot ulcer 03/02/2023 No Yes I10 Essential (primary) hypertension 03/02/2023 No Yes N18.32 Chronic kidney disease, stage 3b 03/02/2023 No Yes Inactive Problems Resolved Problems Electronic Signature(s) Signed: 04/07/2023 2:54:11 PM By: [name] EMT Signed: 04/07/2023 3:19:16 PM By: [name] MD FACS Entered By: [name] on 04/07/2023 14:54:10 -------------------------------------------------------------------------------- SuperBill Details Patient Name: Date of Service: [name], [name] 04/07/2023 Medical Record Number: [id number] Patient Account Number: [id number] Date of Birth/Sex: Treating RN: 1951/02/19 (72 y.o. [name] Primary Care Provider: [name] Other Clinician: [name] Referring Provider: Treating Provider/Extender: [name]: 5 Diagnosis Coding ICD-10 Codes Code Description [phone] Non-pressure chronic ulcer of other part of right foot with necrosis of bone L97.512 Non-pressure chronic ulcer of  other part of right foot with fat layer exposed [name], Naser ([id number]) [id number] Page 2 of 2 M86.171 Other acute osteomyelitis, right ankle and foot E11.621 Type 2 diabetes mellitus with foot ulcer I10 Essential (primary) hypertension N18.32 Chronic kidney disease, stage 3b Facility Procedures : CPT4 Code Description: [id number] G0277-(Facility Use Only) HBOT full body chamber, , ICD-10 Diagnosis Description M86.171 Other acute osteomyelitis, right ankle and foot L97.514 Non-pressure chronic ulcer of other part of right foot L97.512  Non-pressure chronic ulcer of other part of right foot E11.621 Type 2 diabetes mellitus with foot ulcer Modifier: with necrosis of with fat layer e Quantity: 5 bone xposed Physician Procedures : CPT4 Code Description Modifier [id number] [phone] - WC PHYS HYPERBARIC OXYGEN THERAPY ICD-10 Diagnosis Description M86.171 Other acute osteomyelitis, right ankle and foot L97.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

## 2023-04-08 NOTE — Progress Notes (Signed)
[name], [name] ([id number]) [id number] Page 1 of 2 Visit Report for 04/07/2023 HBO Details Patient Name: Date of Service: [name] 04/07/2023 8:00 A M Medical Record Number: [id number] Patient Account Number: [id number] Date of Birth/Sex: Treating RN: 1951-02-21 (72 y.o. [name] Primary Care [name]: [name] Other Clinician: Karl [name] Referring [name]: Treating [name]/Extender: [name] in Treatment: 5 HBO Treatment Course Details Treatment Course Number: 1 Ordering [name]: [name] T Treatments Ordered: otal 40 HBO Treatment Start Date: 04/06/2023 HBO Indication: Diabetic Ulcer(s) of the Lower Extremity Standard/Conservative Wound Care tried and failed greater than or equal to 30 days Wound #8 Right T Great oe HBO Treatment Details Treatment Number: 2 Patient Type: Outpatient Chamber Type: Monoplace Chamber Serial #: [id number] Treatment Protocol: 2.5 ATA with 90 minutes oxygen, with two 5 minute air breaks Treatment Details Compression Rate Down: 1.0 psi / minute De-Compression Rate Up: 1.5 psi / minute A breaks and breathing ir Compress Tx Pressure periods Decompress Decompress Begins Reached (leave unused spaces Begins Ends blank) Chamber Pressure (ATA 1 2.5 2.5 2.5 2.5 2.5 - - 2.5 1 ) Clock Time (24 hr) 08:39 09:02 09:32 09:37 10:07 10:12 - - 10:42 10:59 Treatment Length: 140 (minutes) Treatment Segments: 5 Vital Signs Capillary Blood Glucose Reference Range: 80 - 120 mg / dl HBO Diabetic Blood Glucose Intervention Range: <131 mg/dl or >324 mg/dl Time Vitals Blood Respiratory Capillary Blood Glucose Pulse Action Type: Pulse: Temperature: Taken: Pressure: Rate: Glucose (mg/dl): Meter #: Oximetry (%) Taken: Pre 08:17 171/78 70 18 97.7 225 Post 11:06 154/85 72 18 97.3 231 Treatment Response Treatment Toleration: Well Treatment Completion Status: Treatment  Completed without Adverse Event Additional Procedure Documentation Tissue Sevierity: Necrosis of bone Physician HBO Attestation: I certify that I supervised this HBO treatment in accordance with Medicare guidelines. A trained emergency response team is readily available per Yes hospital policies and procedures. Continue HBOT as ordered. Yes Electronic Signature(s) Signed: 04/07/2023 3:19:40 PM By: [name] Previous Signature: 04/07/2023 2:53:18 PM Version By: [name] EMT Previous Signature: 04/07/2023 2:52:57 PM Version By: [name] EMT Entered By: [name] on 04/07/2023 15:19:40 -------------------------------------------------------------------------------- HBO Safety Checklist Details Patient Name: Date of Service: [name] 04/07/2023 8:00 A M Medical Record Number: [id number] Patient Account Number: [id number] [name] ([id number]) [id number] Page 2 of 2 Date of Birth/Sex: Treating RN: [birth year] (72 y.o. [name] Primary Care [name]: Other Clinician: Scarlette [name] Referring [name]: Treating [name]/Extender: [name] in Treatment: 5 HBO Safety Checklist Items Safety Checklist Consent Form Signed Patient voided / foley secured and emptied When did you last eato 0700 Last dose of injectable or oral agent 0700 Ostomy pouch emptied and vented if applicable NA All implantable devices assessed, documented and approved NA Intravenous access site secured and place NA Valuables secured Linens and cotton and cotton/polyester blend (less than 51% polyester) Personal oil-based products / skin lotions / body lotions removed Wigs or hairpieces removed NA Smoking or tobacco materials removed Books / newspapers / magazines / loose paper removed Cologne, aftershave, perfume and deodorant removed Jewelry removed (may wrap wedding band) Make-up  removed NA Hair care products removed Battery operated devices (external) removed Heating patches and chemical warmers removed Titanium eyewear removed NA Nail polish cured greater than 10 hours NA Casting material cured greater than 10 hours NA Hearing aids removed NA Loose dentures or partials removed removed by patient Prosthetics have been removed NA Patient  demonstrates correct use of air break device (if applicable) Patient concerns have been addressed Patient grounding bracelet on and cord attached to chamber Specifics for Inpatients (complete in addition to above) Medication sheet sent with patient NA Intravenous medications needed or due during therapy sent with patient NA Drainage tubes (e.g. nasogastric tube or chest tube secured and vented) NA Endotracheal or Tracheotomy tube secured NA Cuff deflated of air and inflated with saline NA Airway suctioned NA Notes The safety checklist was done before the treatment was started. Electronic Signature(s) Signed: 04/07/2023 2:48:43 PM By: [name] EMT Entered By: [name] on 04/07/2023 14:48:42

## 2023-04-09 ENCOUNTER — Encounter (HOSPITAL_BASED_OUTPATIENT_CLINIC_OR_DEPARTMENT_OTHER): Payer: Medicare HMO | Admitting: General Surgery

## 2023-04-09 DIAGNOSIS — E1122 Type 2 diabetes mellitus with diabetic chronic kidney disease: Secondary | ICD-10-CM | POA: Diagnosis not present

## 2023-04-09 DIAGNOSIS — I129 Hypertensive chronic kidney disease with stage 1 through stage 4 chronic kidney disease, or unspecified chronic kidney disease: Secondary | ICD-10-CM | POA: Diagnosis not present

## 2023-04-09 DIAGNOSIS — E11621 Type 2 diabetes mellitus with foot ulcer: Secondary | ICD-10-CM | POA: Diagnosis not present

## 2023-04-09 DIAGNOSIS — L97514 Non-pressure chronic ulcer of other part of right foot with necrosis of bone: Secondary | ICD-10-CM | POA: Diagnosis not present

## 2023-04-09 DIAGNOSIS — M86171 Other acute osteomyelitis, right ankle and foot: Secondary | ICD-10-CM | POA: Diagnosis not present

## 2023-04-09 DIAGNOSIS — N1832 Chronic kidney disease, stage 3b: Secondary | ICD-10-CM | POA: Diagnosis not present

## 2023-04-09 LAB — GLUCOSE, CAPILLARY
Glucose-Capillary: 160 mg/dL — ABNORMAL HIGH (ref 70–99)
Glucose-Capillary: 74 mg/dL (ref 70–99)
Glucose-Capillary: 93 mg/dL (ref 70–99)

## 2023-04-09 NOTE — Progress Notes (Signed)
Glidden, [name] ([id number]) [id number] Page 1 of 2 Visit Report for 04/08/2023 HBO Details Patient Name: Date of Service: [name] 04/08/2023 8:00 A M Medical Record Number: [id number] Patient Account Number: [id number] Date of Birth/Sex: Treating RN: 01-28-1951 (71 y.o. [name] Primary Care Tobin Witucki: [name] Other Clinician: [name] Referring Shyane Fossum: Treating Chanley Mcenery/Extender: [name] in Treatment: 5 HBO Treatment Course Details Treatment Course Number: 1 Ordering Mionna Advincula: [name] T Treatments Ordered: otal 40 HBO Treatment Start Date: 04/06/2023 HBO Indication: Diabetic Ulcer(s) of the Lower Extremity Standard/Conservative Wound Care tried and failed greater than or equal to 30 days Wound #8 Right T Great oe HBO Treatment Details Treatment Number: 3 Patient Type: Outpatient Chamber Type: Monoplace Chamber Serial #: [id number] Treatment Protocol: 2.5 ATA with 90 minutes oxygen, with two 5 minute air breaks Treatment Details Compression Rate Down: 1.5 psi / minute De-Compression Rate Up: 1.5 psi / minute A breaks and breathing ir Compress Tx Pressure periods Decompress Decompress Begins Reached (leave unused spaces Begins Ends blank) Chamber Pressure (ATA 1 2.5 2.5 2.5 2.5 2.5 - - 2.5 1 ) Clock Time (24 hr) 08:32 08:47 09:17 09:22 09:52 09:57 - - 10:27 10:44 Treatment Length: 132 (minutes) Treatment Segments: 4 Vital Signs Capillary Blood Glucose Reference Range: 80 - 120 mg / dl HBO Diabetic Blood Glucose Intervention Range: <131 mg/dl or >324 mg/dl Type: Time Vitals Blood Pulse: Respiratory Temperature: Capillary Blood Glucose Pulse Action Taken: Pressure: Rate: Glucose (mg/dl): Meter #: Oximetry (%) Taken: Pre 08:05 173/86 72 18 97.7 204 Post 10:49 159/84 75 18 97.6 56 Patient given Glucerna. Waited for after. Post 11:03 71 Treatment Response Treatment  Toleration: Well Treatment Completion Status: Treatment Completed without Adverse Event Treatment Notes Post treatment blood sugar was 56. The patient was given 8 oz of Glucerna to drink and waited for recheck. Blood sugar recheck at 1103 was 71. The patient stated that he felt fine. Additional Procedure Documentation Tissue Sevierity: Necrosis of bone Physician HBO Attestation: I certify that I supervised this HBO treatment in accordance with Medicare guidelines. A trained emergency response team is readily available per Yes hospital policies and procedures. Continue HBOT as ordered. Yes Electronic Signature(s) Signed: 04/08/2023 4:18:44 PM By: [name] MD FACS Previous Signature: 04/08/2023 3:06:15 PM Version By: [name] EMT Entered By: [name] on 04/08/2023 16:18:44 [name], [name] ([id number]) [id number] Page 2 of 2 -------------------------------------------------------------------------------- HBO Safety Checklist Details Patient Name: Date of Service: [name] 04/08/2023 8:00 A M Medical Record Number: [id number] Patient Account Number: [id number] Date of Birth/Sex: Treating RN: Jan 26, 1951 (72 y.o. [name] Primary Care Isobel Eisenhuth: [name] Other Clinician: [name] Referring Adithya Difrancesco: Treating Yasmene Salomone/Extender: [name]: 5 HBO Safety Checklist Items Safety Checklist Consent Form Signed Patient voided / foley secured and emptied When did you last eato 0700 Last dose of injectable or oral agent 0715 Ostomy pouch emptied and vented if applicable NA All implantable devices assessed, documented and approved NA Intravenous access site secured and place NA Valuables secured Linens and cotton and cotton/polyester blend (less than 51% polyester) Personal oil-based products / skin lotions / body lotions removed Wigs or hairpieces removed NA Smoking or  tobacco materials removed Books / newspapers / magazines / loose paper removed Cologne, aftershave, perfume and deodorant removed Jewelry removed (may wrap wedding band) Make-up removed NA Hair care products removed Battery operated devices (external) removed Heating patches and chemical warmers removed Titanium  eyewear removed NA Nail polish cured greater than 10 hours NA Casting material cured greater than 10 hours NA Hearing aids removed NA Loose dentures or partials removed removed by patient Prosthetics have been removed NA Patient demonstrates correct use of air break device (if applicable) Patient concerns have been addressed Patient grounding bracelet on and cord attached to chamber Specifics for Inpatients (complete in addition to above) Medication sheet sent with patient NA Intravenous medications needed or due during therapy sent with patient NA Drainage tubes (e.g. nasogastric tube or chest tube secured and vented) NA Endotracheal or Tracheotomy tube secured NA Cuff deflated of air and inflated with saline NA Airway suctioned NA Notes The safety checklist was done before the treatment was started. Electronic Signature(s) Signed: 04/08/2023 3:01:32 PM By: [name] EMT Entered By: [name] on 04/08/2023 15:01:32

## 2023-04-09 NOTE — Progress Notes (Addendum)
Nashua, [name] ([id number]) [id number] Page 1 of 2 Visit Report for 04/08/2023 Arrival Information Details Patient Name: Date of Service: [name] 04/08/2023 8:00 A M Medical Record Number: [id number] Patient Account Number: [id number] Date of Birth/Sex: Treating RN: 08/26/1951 (72 y.o. [name] Primary Care Roan Miklos: [name] Other Clinician: [name] Referring Zolton Dowson: Treating Tal Neer/Extender: [name] in Treatment: 5 Visit Information History Since Last Visit All ordered tests and consults were completed: Yes Patient Arrived: Ambulatory Added or deleted any medications: No Arrival Time: 14:55 Any new allergies or adverse reactions: No Accompanied By: None Had a fall or experienced change in No Transfer Assistance: None activities of daily living that may affect Patient Identification Verified: Yes risk of falls: Secondary Verification Process Completed: Yes Signs or symptoms of abuse/neglect since last visito No Patient Requires Transmission-Based Precautions: No Hospitalized since last visit: No Patient Has Alerts: Yes Implantable device outside of the clinic excluding No Patient Alerts: Patient on Blood Thinner cellular tissue based products placed in the center Plavix since last visit: ABI R [place] Pain Present Now: No Electronic Signature(s) Signed: 04/14/2023 2:46:50 PM By: [name] EMT Previous Signature: 04/08/2023 2:58:35 PM Version By: [name] EMT Entered By: [name] on 04/08/2023 14:58:45 -------------------------------------------------------------------------------- Encounter Discharge Information Details Patient Name: Date of Service: [name] 04/08/2023 8:00 A M Medical Record Number: [id number] Patient Account Number: [id number] Date of Birth/Sex: Treating RN: 01-24-1951 (72 y.o. [name] Primary Care Amaiya Scruton: [name] Other  Clinician: [name] Referring Andy Allende: Treating Jaynell Castagnola/Extender: [name] in Treatment: 5 Encounter Discharge Information Items Discharge Condition: Stable Ambulatory Status: Ambulatory Discharge Destination: Home Transportation: Private Auto Accompanied By: None Schedule Follow-up Appointment: Yes Clinical Summary of Care: Electronic Signature(s) Signed: 04/14/2023 2:46:50 PM By: [name] EMT Entered By: [name] on 04/08/2023 15:07:35 Lamping, [name] ([id number]) [id number] Page 2 of 2 -------------------------------------------------------------------------------- Vitals Details Patient Name: Date of Service: [name] 04/08/2023 8:00 A M Medical Record Number: [id number] Patient Account Number: [id number] Date of Birth/Sex: Treating RN: 06/15/1951 (72 y.o. [name] Primary Care Cain Fitzhenry: [name] Other Clinician: [name] Referring Oluwademilade Kellett: Treating Cheryl Stabenow/Extender: [name]: 5 Vital Signs Time Taken: 08:05 Temperature (F): 97.7 Height (in): 67 Pulse (bpm): 72 Weight (lbs): 154 Respiratory Rate (breaths/min): 18 Body Mass Index (BMI): 24.1 Blood Pressure (mmHg): 173/86 Capillary Blood Glucose (mg/dl): 623 Reference Range: 80 - 120 mg / dl Electronic Signature(s) Signed: 04/14/2023 2:46:50 PM By: [name] EMT Entered By: [name] on 04/08/2023 14:59:17

## 2023-04-10 ENCOUNTER — Encounter (HOSPITAL_BASED_OUTPATIENT_CLINIC_OR_DEPARTMENT_OTHER): Payer: Medicare HMO | Admitting: General Surgery

## 2023-04-10 ENCOUNTER — Ambulatory Visit (HOSPITAL_BASED_OUTPATIENT_CLINIC_OR_DEPARTMENT_OTHER): Payer: Medicare HMO | Admitting: Family

## 2023-04-11 NOTE — Progress Notes (Signed)
[name], [name] ([id number]) [id number] Page 1 of 8 Visit Report for 04/09/2023 Arrival Information Details Patient Name: Date of Service: [name] 04/09/2023 8:15 [name] M Medical Record Number: [id number] Patient Account Number: [id number] Date of Birth/Sex: Treating RN: 1951-04-30 (72 y.o. [name] Primary Care Lavontae Cornia: [name] Other Clinician: Referring Addy Mcmannis: Treating Felipe Paluch/Extender: [name] in Treatment: 5 Visit Information History Since Last Visit Added or deleted any medications: No Patient Arrived: Ambulatory Any new allergies or adverse reactions: No Arrival Time: 08:29 Had [name] fall or experienced change in No Accompanied By: self activities of daily living that may affect Transfer Assistance: None risk of falls: Patient Identification Verified: Yes Signs or symptoms of abuse/neglect since last visito No Secondary Verification Process Completed: Yes Hospitalized since last visit: No Patient Requires Transmission-Based Precautions: No Implantable device outside of the clinic excluding No Patient Has Alerts: Yes cellular tissue based products placed in the center Patient Alerts: Patient on Blood Thinner since last visit: Plavix Has Dressing in Place as Prescribed: Yes ABI R [place] Pain Present Now: No Electronic Signature(s) Signed: 04/09/2023 4:48:54 PM By: [name] RN, BSN Entered By: [name] on 04/09/2023 08:30:25 -------------------------------------------------------------------------------- Encounter Discharge Information Details Patient Name: Date of Service: [name], [name] 04/09/2023 8:15 [name] M Medical Record Number: [id number] Patient Account Number: [id number] Date of Birth/Sex: Treating RN: 09-23-1951 (72 y.o. [name] Primary Care Ezariah Nace: [name] Other Clinician: Referring Ayda Tancredi: Treating Nilsa Macht/Extender: [name] in Treatment: 5 Encounter Discharge Information Items Post Procedure Vitals Discharge Condition: Stable Temperature (F): 98.1 Ambulatory Status: Ambulatory Pulse (bpm): 73 Discharge Destination: Home Respiratory Rate (breaths/min): 18 Transportation: Private Auto Blood Pressure (mmHg): 156/79 Accompanied By: self Schedule Follow-up Appointment: Yes Clinical Summary of Care: Patient Declined Electronic Signature(s) Signed: 04/09/2023 4:48:54 PM By: [name] RN, BSN Entered By: [name] on 04/09/2023 09:12:07 [name] ([id number]) [id number] Page 2 of 8 -------------------------------------------------------------------------------- Lower Extremity Assessment Details Patient Name: Date of Service: [name] 04/09/2023 8:15 [name] M Medical Record Number: [id number] Patient Account Number: [id number] Date of Birth/Sex: Treating RN: 1951/05/04 (72 y.o. [name] Primary Care Jon Lall: [name] Other Clinician: Referring Abriel Geesey: Treating Laveyah Oriol/Extender: [name]: 5 Edema Assessment Assessed: [Left: No] [Right: No] Edema: [Left: N] [Right: o] Calf Left: Right: Point of Measurement: 33 cm From Medial Instep 35 cm Ankle Left: Right: Point of Measurement: 10 cm From Medial Instep 22 cm Vascular Assessment Pulses: Dorsalis Pedis Palpable: [Right:Yes] Electronic Signature(s) Signed: 04/09/2023 4:48:54 PM By: [name] RN, BSN Entered By: [name] on 04/09/2023 08:35:06 -------------------------------------------------------------------------------- Multi Wound Chart Details Patient Name: Date of Service: [name], [name] 04/09/2023 8:15 [name] M Medical Record Number: [id number] Patient Account Number: [id number] Date of Birth/Sex: Treating RN: 07-Oct-1951 (72 y.o. M) Primary Care Ngozi Alvidrez: [name] Other Clinician: Referring  Toney Difatta: Treating Mairlyn Tegtmeyer/Extender: [name]: 5 Vital Signs Height(in): 67 Pulse(bpm): 73 Weight(lbs): 154 Blood Pressure(mmHg): 156/79 Body Mass Index(BMI): 24.1 Temperature(F): 98.1 Respiratory Rate(breaths/min): 18 [8:Photos:] [N/[name]:N/[name]] Right T Great oe N/[name] N/[name] Wound Location: Gradually Appeared N/[name] N/[name] Wounding Event: Diabetic Wound/Ulcer of the Lower N/[name] N/[name] Primary Etiology: Extremity Hypertension, Type II Diabetes, N/[name] N/[name] Comorbid History: Osteoarthritis, Neuropathy, Received Radiation 02/04/2023 N/[name] N/[name] Date Acquired: 5 N/[name] N/[name] Weeks of Treatment: Open N/[name] N/[name] Wound Status: No N/[name] N/[name] Wound Recurrence: 2x2.4x0.3 N/[name] N/[name] Measurements L x W x D (cm) 3.77 N/[name] N/[name]  [name] (cm) : rea 1.131 N/[name] N/[name] Volume (cm) : 67.30% N/[name] N/[name] % Reduction in [name] rea: 51.00% N/[name] N/[name] % Reduction in Volume: Grade 3 N/[name] N/[name] Classification: Medium N/[name] N/[name] Exudate [name] mount: Serosanguineous N/[name] N/[name] Exudate Type: red, brown N/[name] N/[name] Exudate Color: Distinct, outline attached N/[name] N/[name] Wound Margin: Large (67-100%) N/[name] N/[name] Granulation [name] mount: Red, Hyper-granulation N/[name] N/[name] Granulation Quality: Small (1-33%) N/[name] N/[name] Necrotic [name] mount: Fat Layer (Subcutaneous Tissue): Yes N/[name] N/[name] Exposed Structures: Bone: Yes Fascia: No Tendon: No Muscle: No Joint: No Small (1-33%) N/[name] N/[name] Epithelialization: Debridement - Selective/Open Wound N/[name] N/[name] Debridement: Pre-procedure Verification/Time Out 08:50 N/[name] N/[name] Taken: Lidocaine 4% Topical Solution N/[name] N/[name] Pain Control: Slough N/[name] N/[name] Tissue Debrided: Non-Viable Tissue N/[name] N/[name] Level: 3.77 N/[name] N/[name] Debridement [name] (sq cm): rea Curette N/[name] N/[name] Instrument: Minimum N/[name] N/[name] Bleeding: Pressure N/[name] N/[name] Hemostasis [name] chieved: 0 N/[name] N/[name] Procedural Pain: 0 N/[name] N/[name] Post Procedural Pain: Procedure was tolerated well N/[name] N/[name] Debridement Treatment Response: 2x2.4x0.3 N/[name] N/[name] Post Debridement  Measurements L x W x D (cm) 1.131 N/[name] N/[name] Post Debridement Volume: (cm) Callus: No N/[name] N/[name] Periwound Skin Texture: Maceration: No N/[name] N/[name] Periwound Skin Moisture: Dry/Scaly: No No Abnormalities Noted N/[name] N/[name] Periwound Skin Color: No Abnormality N/[name] N/[name] Temperature: Cellular or Tissue Based Product N/[name] N/[name] Procedures Performed: Debridement Treatment Notes Wound #8 (Toe Great) Wound Laterality: Right Cleanser Normal Saline Discharge Instruction: Cleanse the wound with Normal Saline prior to applying [name] clean dressing using gauze sponges, not tissue or cotton balls. Soap and Water Discharge Instruction: May shower and wash wound with dial antibacterial soap and water prior to dressing change. Peri-Wound Care Topical Primary Dressing Epicord Secondary Dressing ADAPTIC TOUCH 3x4.25 in Discharge Instruction: Apply over primary dressing as directed. Woven Gauze Sponges 2x2 in Discharge Instruction: Apply over primary dressing as directed. Secured With Conforming Stretch Gauze Bandage, Sterile 2x75 (in/in) Discharge Instruction: Secure with stretch gauze as directed. THORNDYKE, [name] ([id number]) [id number] Page 4 of 8 66M Medipore H Soft Cloth Surgical T ape, 4 x 10 (in/yd) Discharge Instruction: Secure with tape as directed. Compression Wrap Compression Stockings Add-Ons Electronic Signature(s) Signed: 04/09/2023 9:56:32 AM By: [name] MD FACS Entered By: [name] on 04/09/2023 09:56:31 -------------------------------------------------------------------------------- Multi-Disciplinary Care Plan Details Patient Name: Date of Service: [name], [name] 04/09/2023 8:15 [name] M Medical Record Number: [id number] Patient Account Number: [id number] Date of Birth/Sex: Treating RN: July 27, 1951 (72 y.o. [name] Primary Care Ilai Hiller: [name] Other Clinician: Referring Lon Klippel: Treating Schneider Warchol/Extender: [name] in Treatment: 5 Multidisciplinary Care Plan reviewed with physician Active Inactive HBO Nursing Diagnoses: Anxiety related to knowledge deficit of hyperbaric oxygen therapy and treatment procedures Goals: Patient will tolerate the hyperbaric oxygen therapy treatment Date Initiated: 03/02/2023 Target Resolution Date: 05/27/2023 Goal Status: Active Interventions: Assess for signs and symptoms related to adverse events, including but not limited to confinement anxiety, pneumothorax, oxygen toxicity and baurotrauma Notes: Nutrition Nursing Diagnoses: Imbalanced nutrition Impaired glucose control: actual or potential Potential for alteratiion in Nutrition/Potential for imbalanced nutrition Goals: Patient/caregiver will maintain therapeutic glucose control Date Initiated: 03/17/2023 Target Resolution Date: 04/14/2023 Goal Status: Active Interventions: Assess HgA1c results as ordered upon admission and as needed Assess patient nutrition upon admission and as needed per policy Treatment Activities: Patient referred to Primary Care Physician for further nutritional evaluation : 03/17/2023 Notes: Wound/Skin Impairment Nursing Diagnoses: Impaired tissue integrity [name], [name] ([id number]) [id number] Page 5 of 8 Knowledge deficit related to ulceration/compromised skin  integrity Goals: Patient/caregiver will verbalize understanding of skin care regimen Date Initiated: 03/17/2023 Target Resolution Date: 04/28/2023 Goal Status: Active Ulcer/skin breakdown will have [name] volume reduction of 30% by week 4 Date Initiated: 03/17/2023 Date Inactivated: 04/02/2023 Target Resolution Date: 03/31/2023 Goal Status: Met Ulcer/skin breakdown will have [name] volume reduction of 50% by week 8 Date Initiated: 04/02/2023 Target Resolution Date: 04/30/2023 Goal Status: Active Interventions: Assess patient/caregiver ability to obtain necessary supplies Assess patient/caregiver  ability to perform ulcer/skin care regimen upon admission and as needed Assess ulceration(s) every visit Treatment Activities: Skin care regimen initiated : 03/17/2023 Topical wound management initiated : 03/17/2023 Notes: Electronic Signature(s) Signed: 04/09/2023 4:48:54 PM By: [name] RN, BSN Entered By: [name] on 04/09/2023 08:39:16 -------------------------------------------------------------------------------- Pain Assessment Details Patient Name: Date of Service: [name], [name] 04/09/2023 8:15 [name] M Medical Record Number: [id number] Patient Account Number: [id number] Date of Birth/Sex: Treating RN: September 22, 1951 (72 y.o. [name] Primary Care Jhoselyn Ruffini: [name] Other Clinician: Referring Roston Grunewald: Treating Abigail Marsiglia/Extender: [name]: 5 Active Problems Location of Pain Severity and Description of Pain Patient Has Paino No Site Locations Rate the pain. Current Pain Level: 0 Pain Management and Medication Current Pain Management: Electronic Signature(s) Signed: 04/09/2023 4:48:54 PM By: [name] RN, BSN [place], Ashok ([id number]) [id number] Page 6 of 8 Entered By: [name] on 04/09/2023 08:31:09 -------------------------------------------------------------------------------- Patient/Caregiver Education Details Patient Name: Date of Service: [name] 6/13/2024andnbsp8:15 [name] M Medical Record Number: [id number] Patient Account Number: [id number] Date of Birth/Gender: Treating RN: [birth year] (72 y.o. [name] Primary Care Physician: [name] Other Clinician: Referring Physician: Treating Physician/Extender: [name] in Treatment: 5 Education Assessment Education Provided To: Patient Education Topics Provided Elevated Blood Sugar/ Impact on Healing: Methods: Explain/Verbal Responses: Reinforcements needed,  State content correctly Hyperbaric Oxygenation: Methods: Explain/Verbal Responses: Reinforcements needed, State content correctly Offloading: Methods: Explain/Verbal Responses: Reinforcements needed, State content correctly Wound/Skin Impairment: Methods: Explain/Verbal Responses: Reinforcements needed, State content correctly Electronic Signature(s) Signed: 04/09/2023 4:48:54 PM By: [name] RN, BSN Entered By: [name] on 04/09/2023 08:41:02 -------------------------------------------------------------------------------- Wound Assessment Details Patient Name: Date of Service: [name], [name] 04/09/2023 8:15 [name] M Medical Record Number: [id number] Patient Account Number: [id number] Date of Birth/Sex: Treating RN: [birth year] (72 y.o. [name] Primary Care Seif Teichert: [name] Other Clinician: Referring Branden Vine: Treating Steven Veazie/Extender: [name]: 5 Wound Status Wound Number: 8 Primary Diabetic Wound/Ulcer of the Lower Extremity Etiology: Wound Location: Right T Great oe Wound Open Wounding Event: Gradually Appeared Status: Date Acquired: 02/04/2023 Comorbid Hypertension, Type II Diabetes, Osteoarthritis, Neuropathy, Weeks Of Treatment: 5 History: Received Radiation Clustered Wound: No Photos [name], [name] ([id number]) [id number] Page 7 of 8 Wound Measurements Length: (cm) 2 Width: (cm) 2.4 Depth: (cm) 0.3 Area: (cm) 3.77 Volume: (cm) 1.131 % Reduction in Area: 67.3% % Reduction in Volume: 51% Epithelialization: Small (1-33%) Tunneling: No Undermining: No Wound Description Classification: Grade 3 Wound Margin: Distinct, outline attached Exudate Amount: Medium Exudate Type: Serosanguineous Exudate Color: red, brown Foul Odor After Cleansing: No Slough/Fibrino Yes Wound Bed Granulation Amount: Large (67-100%) Exposed Structure Granulation Quality: Red,  Hyper-granulation Fascia Exposed: No Necrotic Amount: Small (1-33%) Fat Layer (Subcutaneous Tissue) Exposed: Yes Necrotic Quality: Adherent Slough Tendon Exposed: No Muscle Exposed: No Joint Exposed: No Bone Exposed: Yes Periwound Skin Texture Texture Color No Abnormalities Noted: Yes No Abnormalities Noted: Yes Moisture Temperature / Pain No Abnormalities Noted: No Temperature: No Abnormality Dry /  Scaly: No Maceration: No Treatment Notes Wound #8 (Toe Great) Wound Laterality: Right Cleanser Normal Saline Discharge Instruction: Cleanse the wound with Normal Saline prior to applying [name] clean dressing using gauze sponges, not tissue or cotton balls. Soap and Water Discharge Instruction: May shower and wash wound with dial antibacterial soap and water prior to dressing change. Peri-Wound Care Topical Primary Dressing Epicord Secondary Dressing ADAPTIC TOUCH 3x4.25 in Discharge Instruction: Apply over primary dressing as directed. Woven Gauze Sponges 2x2 in Discharge Instruction: Apply over primary dressing as directed. Secured With Conforming Stretch Gauze Bandage, Sterile 2x75 (in/in) Discharge Instruction: Secure with stretch gauze as directed. 22M Medipore H Soft Cloth Surgical T ape, 4 x 10 (in/yd) Discharge Instruction: Secure with tape as directed. [name], [name] ([id number]) [id number] Page 8 of 8 Compression Wrap Compression Stockings Add-Ons Electronic Signature(s) Signed: 04/09/2023 4:48:54 PM By: [name] RN, BSN Entered By: [name] on 04/09/2023 08:37:55 -------------------------------------------------------------------------------- Vitals Details Patient Name: Date of Service: [name], [name] 04/09/2023 8:15 [name] M Medical Record Number: [id number] Patient Account Number: [id number] Date of Birth/Sex: Treating RN: April 11, 1951 (72 y.o. [name] Primary Care Mahamud Metts: [name] Other  Clinician: Referring Linnae Rasool: Treating Calton Harshfield/Extender: [name]: 5 Vital Signs Time Taken: 08:30 Temperature (F): 98.1 Height (in): 67 Pulse (bpm): 73 Weight (lbs): 154 Respiratory Rate (breaths/min): 18 Body Mass Index (BMI): 24.1 Blood Pressure (mmHg): 156/79 Reference Range: 80 - 120 mg / dl Electronic Signature(s) Signed: 04/09/2023 4:48:54 PM By: [name] RN, BSN Entered By: [name] on 04/09/2023 08:31:01

## 2023-04-11 NOTE — Progress Notes (Signed)
[place], [name] ([id number]) [id number] Page 1 of 2 Visit Report for 04/09/2023 Arrival Information Details Patient Name: Date of Service: [name] 04/09/2023 9:00 A M Medical Record Number: [id number] Patient Account Number: [id number] Date of Birth/Sex: Treating RN: 19-Feb-1951 (72 y.o. [name] Primary Care Paizlie Klaus: [name] Other Clinician: [name] Referring Nahal Wanless: Treating Adi Doro/Extender: [name] in Treatment: 5 Visit Information History Since Last Visit All ordered tests and consults were completed: Yes Patient Arrived: Ambulatory Added or deleted any medications: No Arrival Time: 08:24 Any new allergies or adverse reactions: No Accompanied By: None Had a fall or experienced change in No Transfer Assistance: None activities of daily living that may affect Patient Identification Verified: Yes risk of falls: Secondary Verification Process Completed: Yes Signs or symptoms of abuse/neglect since last visito No Patient Requires Transmission-Based Precautions: No Hospitalized since last visit: No Patient Has Alerts: Yes Implantable device outside of the clinic excluding No Patient Alerts: Patient on Blood Thinner cellular tissue based products placed in the center Plavix since last visit: ABI R  Pain Present Now: No Electronic Signature(s) Signed: 04/09/2023 3:04:32 PM By: [name] EMT Entered By: [name] on 04/09/2023 15:04:31 -------------------------------------------------------------------------------- Encounter Discharge Information Details Patient Name: Date of Service: [name] 04/09/2023 9:00 A M Medical Record Number: [id number] Patient Account Number: [id number] Date of Birth/Sex: Treating RN: 1951/07/03 (72 y.o. [name] Primary Care Meleana Commerford: [name] Other Clinician: [name] Referring Jenner Rosier: Treating  Ngoc Detjen/Extender: [name] in Treatment: 5 Encounter Discharge Information Items Discharge Condition: Stable Ambulatory Status: Ambulatory Discharge Destination: Home Transportation: Private Auto Accompanied By: None Schedule Follow-up Appointment: Yes Clinical Summary of Care: Electronic Signature(s) Signed: 04/09/2023 3:17:02 PM By: [name] EMT Entered By: [name] on 04/09/2023 15:17:02 [place], [name] ([id number]) [id number] Page 2 of 2 -------------------------------------------------------------------------------- Vitals Details Patient Name: Date of Service: [name] 04/09/2023 9:00 A M Medical Record Number: [id number] Patient Account Number: [id number] Date of Birth/Sex: Treating RN: 06-11-1951 (72 y.o. [name] Primary Care Samar Dass: [name] Other Clinician: [name] Referring Melinda Pottinger: Treating Tylene Quashie/Extender: [name]: 5 Vital Signs Time Taken: 09:24 Temperature (F): 97.3 Height (in): 67 Pulse (bpm): 65 Weight (lbs): 154 Respiratory Rate (breaths/min): 18 Body Mass Index (BMI): 24.1 Blood Pressure (mmHg): 134/71 Capillary Blood Glucose (mg/dl): 220 Reference Range: 80 - 120 mg / dl Electronic Signature(s) Signed: 04/09/2023 3:05:08 PM By: [name] EMT Entered By: [name] on 04/09/2023 15:05:08

## 2023-04-11 NOTE — Progress Notes (Signed)
[place], [name] ([id number]) [id number] Page 1 of 2 Visit Report for 04/09/2023 HBO Details Patient Name: Date of Service: [name] 04/09/2023 9:00 A M Medical Record Number: [id number] Patient Account Number: [id number] Date of Birth/Sex: Treating RN: Jan 24, 1951 (72 y.o. [name] Primary Care Haidar Muse: [name] Other Clinician: [name] Referring Wright Gravely: Treating Lexxus Underhill/Extender: [name] in Treatment: 5 HBO Treatment Course Details Treatment Course Number: 1 Ordering Lyrik Dockstader: [name] T Treatments Ordered: otal 40 HBO Treatment Start Date: 04/06/2023 HBO Indication: Diabetic Ulcer(s) of the Lower Extremity Standard/Conservative Wound Care tried and failed greater than or equal to 30 days Wound #8 Right T Great oe HBO Treatment Details Treatment Number: 4 Patient Type: Outpatient Chamber Type: Monoplace Chamber Serial #: [id number] Treatment Protocol: 2.5 ATA with 90 minutes oxygen, with two 5 minute air breaks Treatment Details Compression Rate Down: 1.0 psi / minute De-Compression Rate Up: 2.0 psi / minute A breaks and breathing ir Compress Tx Pressure periods Decompress Decompress Begins Reached (leave unused spaces Begins Ends blank) Chamber Pressure (ATA 1 2.5 2.5 2.5 2.5 2.5 - - 2.5 1 ) Clock Time (24 hr) 09:31 09:43 10:13 10:18 10:48 10:53 - - 11:23 11:35 Treatment Length: 124 (minutes) Treatment Segments: 4 Vital Signs Capillary Blood Glucose Reference Range: 80 - 120 mg / dl HBO Diabetic Blood Glucose Intervention Range: <131 mg/dl or >324 mg/dl Type: Time Vitals Blood Pulse: Respiratory Temperature: Capillary Blood Glucose Pulse Action Taken: Pressure: Rate: Glucose (mg/dl): Meter #: Oximetry (%) Taken: Pre 09:24 134/71 65 18 97.3 160 8oz Glucerna per new orders Post 11:39 150/78 70 18 97.4 74 Patient given Glucerna. Waited for after. Post 11:56 93 Treatment  Response Treatment Toleration: Well Treatment Completion Status: Treatment Completed without Adverse Event Treatment Notes New orders received for per treatment Glucose control. Give the patient 8 oz of Glucerna before starting treatment. The patient was given 8 oz of Glucerna before the treatment was started per new order. Post Blood sugar was 74. The patient was given 8 oz of Glucerna to drink, waiting and rechecked before leaving. The second post treatment blood sugar was 93. Dr. Lady Gary informed. The patient discharged home. Additional Procedure Documentation Tissue Sevierity: Necrosis of bone Physician HBO Attestation: I certify that I supervised this HBO treatment in accordance with Medicare guidelines. A trained emergency response team is readily available per Yes hospital policies and procedures. Continue HBOT as ordered. Yes Electronic Signature(s) Signed: 04/09/2023 4:25:49 PM By: [name] MD FACS [place], Signed: 04/09/2023 4:25:49 PM By: [name] MD FACS [place] ([id number]) [id number] Page 2 of 2 Previous Signature: 04/09/2023 3:15:35 PM Version By: [name] EMT Entered By: [name] on 04/09/2023 16:25:48 -------------------------------------------------------------------------------- HBO Safety Checklist Details Patient Name: Date of Service: [name], [name] 04/09/2023 9:00 A M Medical Record Number: [id number] Patient Account Number: [id number] Date of Birth/Sex: Treating RN: Mar 31, 1951 (72 y.o. [name] Primary Care Jeury Mcnab: [name] Other Clinician: [name] Referring Redina Zeller: Treating Mikeyla Music/Extender: [name] in Treatment: 5 HBO Safety Checklist Items Safety Checklist Consent Form Signed Patient voided / foley secured and emptied When did you last eato 0700 Last dose of injectable or oral agent 0700 Ostomy pouch emptied and vented if applicable NA All  implantable devices assessed, documented and approved NA Intravenous access site secured and place NA Valuables secured Linens and cotton and cotton/polyester blend (less than 51% polyester) Personal oil-based products / skin lotions / body lotions removed Wigs or  hairpieces removed NA Smoking or tobacco materials removed Books / newspapers / magazines / loose paper removed Cologne, aftershave, perfume and deodorant removed Jewelry removed (may wrap wedding band) Make-up removed NA Hair care products removed NA Battery operated devices (external) removed Heating patches and chemical warmers removed Titanium eyewear removed NA Nail polish cured greater than 10 hours NA Casting material cured greater than 10 hours NA Hearing aids removed NA Loose dentures or partials removed NA Prosthetics have been removed NA Patient demonstrates correct use of air break device (if applicable) Patient concerns have been addressed Patient grounding bracelet on and cord attached to chamber Specifics for Inpatients (complete in addition to above) Medication sheet sent with patient NA Intravenous medications needed or due during therapy sent with patient NA Drainage tubes (e.g. nasogastric tube or chest tube secured and vented) NA Endotracheal or Tracheotomy tube secured NA Cuff deflated of air and inflated with saline NA Airway suctioned Electronic Signature(s) Signed: 04/09/2023 3:06:12 PM By: [name] EMT Entered By: [name] on 04/09/2023 15:06:12

## 2023-04-11 NOTE — Progress Notes (Signed)
ULYSSE, LODICO ([id number]) [id number] Page 1 of 2 Visit Report for 04/09/2023 Problem List Details Patient Name: Date of Service: [name] 04/09/2023 9:00 A M Medical Record Number: [id number] Patient Account Number: [id number] Date of Birth/Sex: Treating RN: 1951/03/09 (72 y.o. 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/02/2023 No Yes necrosis of bone M86.171 Other acute osteomyelitis, right ankle and foot 03/02/2023 No Yes E11.621 Type 2 diabetes mellitus with foot ulcer 03/02/2023 No Yes I10 Essential (primary) hypertension 03/02/2023 No Yes N18.32 Chronic kidney disease, stage 3b 03/02/2023 No Yes Inactive Problems Resolved Problems ICD-10 Code Description Active Date Resolved Date L97.512 Non-pressure chronic ulcer of other part of right foot with fat layer 03/09/2023 03/09/2023 exposed Electronic Signature(s) Signed: 04/09/2023 3:16:08 PM By: [name] EMT Signed: 04/09/2023 4:25:23 PM By: [name] MD FACS Entered By: [name] on 04/09/2023 15:16:08 -------------------------------------------------------------------------------- SuperBill Details Patient Name: Date of Service: [name], [name] 04/09/2023 Medical Record Number: [id number] Patient Account Number: [id number] Date of Birth/Sex: Treating RN: 04-Mar-1951 (72 y.o. [name] [place], [place] ([id number]) [id number].pdf Page 2 of 2 Primary Care Provider: [name] Other Clinician: [name] Referring Provider: Treating Provider/Extender: [name] in Treatment: 5 Diagnosis Coding ICD-10 Codes Code  Description [phone] Non-pressure chronic ulcer of other part of right foot with necrosis of bone L97.512 Non-pressure chronic ulcer of other part of right foot with fat layer exposed M86.171 Other acute osteomyelitis, right ankle and foot E11.621 Type 2 diabetes mellitus with foot ulcer I10 Essential (primary) hypertension N18.32 Chronic kidney disease, stage 3b Facility Procedures : CPT4 Code Description: [id number] G0277-(Facility Use Only) HBOT full body chamber, , ICD-10 Diagnosis Description M86.171 Other acute osteomyelitis, right ankle and foot L97.514 Non-pressure chronic ulcer of other part of right foot wit L97.512  Non-pressure chronic ulcer of other part of right foot wit E11.621 Type 2 diabetes mellitus with foot ulcer Modifier: h necrosis o h fat layer Quantity: 4 f bone exposed Physician Procedures : CPT4 Code Description Modifier [id number] - WC PHYS HYPERBARIC OXYGEN THERAPY ICD-10 Diagnosis Description M86.171 Other acute osteomyelitis, right ankle and foot L97.514 Non-pressure chronic ulcer of other part of right foot with necrosis o L97.512  Non-pressure chronic ulcer of other part of right foot with fat layer E11.621 Type 2 diabetes mellitus with foot ulcer Quantity: 1 f bone exposed Electronic Signature(s) Signed: 04/09/2023 3:16:03 PM By: [name] EMT Signed: 04/09/2023 4:25:23 PM By: [name] MD FACS Entered By: [name] on 04/09/2023 15:16:03

## 2023-04-13 ENCOUNTER — Encounter (HOSPITAL_BASED_OUTPATIENT_CLINIC_OR_DEPARTMENT_OTHER): Payer: Medicare HMO | Admitting: General Surgery

## 2023-04-13 DIAGNOSIS — N1832 Chronic kidney disease, stage 3b: Secondary | ICD-10-CM | POA: Diagnosis not present

## 2023-04-13 DIAGNOSIS — M86171 Other acute osteomyelitis, right ankle and foot: Secondary | ICD-10-CM | POA: Diagnosis not present

## 2023-04-13 DIAGNOSIS — E11621 Type 2 diabetes mellitus with foot ulcer: Secondary | ICD-10-CM | POA: Diagnosis not present

## 2023-04-13 DIAGNOSIS — E1122 Type 2 diabetes mellitus with diabetic chronic kidney disease: Secondary | ICD-10-CM | POA: Diagnosis not present

## 2023-04-13 DIAGNOSIS — I129 Hypertensive chronic kidney disease with stage 1 through stage 4 chronic kidney disease, or unspecified chronic kidney disease: Secondary | ICD-10-CM | POA: Diagnosis not present

## 2023-04-13 DIAGNOSIS — L97514 Non-pressure chronic ulcer of other part of right foot with necrosis of bone: Secondary | ICD-10-CM | POA: Diagnosis not present

## 2023-04-13 LAB — GLUCOSE, CAPILLARY
Glucose-Capillary: 200 mg/dL — ABNORMAL HIGH (ref 70–99)
Glucose-Capillary: 202 mg/dL — ABNORMAL HIGH (ref 70–99)
Glucose-Capillary: 201 mg/dL — ABNORMAL HIGH (ref 70–99)

## 2023-04-13 NOTE — Progress Notes (Signed)
TIFFANY, PERRA ([id number]) [id number] Page 1 of 2 Visit Report for 04/13/2023 Problem List Details Patient Name: Date of Service: [name] 04/13/2023 12:00 PM Medical Record Number: [id number] Patient Account Number: [id number] Date of Birth/Sex: Treating RN: 1951/02/15 (72 y.o. 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 No Yes necrosis of bone M86.171 Other acute osteomyelitis, right ankle and foot 03/02/2023 No Yes E11.621 Type 2 diabetes mellitus with foot ulcer 03/02/2023 No Yes I10 Essential (primary) hypertension 03/02/2023 No Yes N18.32 Chronic kidney disease, stage 3b 03/02/2023 No Yes Inactive Problems Resolved Problems ICD-10 Code Description Active Date Resolved Date L97.512 Non-pressure chronic ulcer of other part of right foot with fat layer 03/09/2023 03/09/2023 exposed Electronic Signature(s) Signed: 04/13/2023 3:05:11 PM By: [name] EMT Signed: 04/13/2023 3:27:18 PM By: [name] MD FACS Entered By: [name] on 04/13/2023 15:05:11 -------------------------------------------------------------------------------- SuperBill Details Patient Name: Date of Service: [name], [name] 04/13/2023 Medical Record Number: [id number] Patient Account Number: [id number] Date of Birth/Sex: Treating RN: 05/03/1951 (72 y.o. [name], [name] ([id number]) [id number] Page 2 of 2 Primary Care Provider: [name] Other Clinician: [name] Referring Provider: Treating Provider/Extender: [name] in Treatment: 6 Diagnosis Coding ICD-10 Codes Code  Description [phone] Non-pressure chronic ulcer of other part of right foot with necrosis of bone L97.512 Non-pressure chronic ulcer of other part of right foot with fat layer exposed M86.171 Other acute osteomyelitis, right ankle and foot E11.621 Type 2 diabetes mellitus with foot ulcer I10 Essential (primary) hypertension N18.32 Chronic kidney disease, stage 3b Facility Procedures : CPT4 Code Description: [id number] G0277-(Facility Use Only) HBOT full body chamber, , ICD-10 Diagnosis Description M86.171 Other acute osteomyelitis, right ankle and foot L97.514 Non-pressure chronic ulcer of other part of right foot wit L97.512  Non-pressure chronic ulcer of other part of right foot wit E11.621 Type 2 diabetes mellitus with foot ulcer Modifier: h necrosis o h fat layer Quantity: 4 f bone exposed Physician Procedures : CPT4 Code Description Modifier [id number] 99183 - WC PHYS HYPERBARIC OXYGEN THERAPY ICD-10 Diagnosis Description M86.171 Other acute osteomyelitis, right ankle and foot L97.514 Non-pressure chronic ulcer of other part of right foot with necrosis o L97.512  Non-pressure chronic ulcer of other part of right foot with fat layer E11.621 Type 2 diabetes mellitus with foot ulcer Quantity: 1 f bone exposed Electronic Signature(s) Signed: 04/13/2023 3:05:02 PM By: [name] EMT Signed: 04/13/2023 3:27:18 PM By: [name] MD FACS Entered By: [name] on 04/13/2023 15:05:02

## 2023-04-13 NOTE — Progress Notes (Signed)
[name] ([id number]) [id number] Page 1 of 2 Visit Report for 04/13/2023 Arrival Information Details Patient Name: Date of Service: [name] 04/13/2023 12:00 PM Medical Record Number: [id number] Patient Account Number: [id number] Date of Birth/Sex: Treating RN: 1951-08-25 (72 y.o. [name], [name] Primary Care Shooter Tangen: [name] Other Clinician: [name] Referring [name]: Treating [name]/Extender: [name] in Treatment: 6 Visit Information History Since Last Visit All ordered tests and consults were completed: Yes Patient Arrived: Ambulatory Added or deleted any medications: No Arrival Time: 11:52 Any new allergies or adverse reactions: No Accompanied By: None Had a fall or experienced change in No Transfer Assistance: None activities of daily living that may affect Patient Identification Verified: Yes risk of falls: Secondary Verification Process Completed: Yes Signs or symptoms of abuse/neglect since last visito No Patient Requires Transmission-Based Precautions: No Hospitalized since last visit: No Patient Has Alerts: Yes Implantable device outside of the clinic excluding No Patient Alerts: Patient on Blood Thinner cellular tissue based products placed in the center Plavix since last visit: ABI R [place] Pain Present Now: No Electronic Signature(s) Signed: 04/13/2023 2:57:37 PM By: [name] EMT Entered By: [name] on 04/13/2023 14:57:36 -------------------------------------------------------------------------------- Encounter Discharge Information Details Patient Name: Date of Service: [name] 04/13/2023 12:00 PM Medical Record Number: [id number] Patient Account Number: [id number] Date of Birth/Sex: Treating RN: 1951/07/24 (72 y.o. [name], [name] Primary Care Nayson Traweek: [name] Other Clinician: [name] Referring [name]: Treating [name]/Extender:  [name] in Treatment: 6 Encounter Discharge Information Items Discharge Condition: Stable Ambulatory Status: Ambulatory Discharge Destination: Home Transportation: Private Auto Accompanied By: None Schedule Follow-up Appointment: Yes Clinical Summary of Care: Electronic Signature(s) Signed: 04/13/2023 3:05:54 PM By: [name] EMT Entered By: [name] on 04/13/2023 15:05:54 [name] ([id number]) [id number] Page 2 of 2 -------------------------------------------------------------------------------- Vitals Details Patient Name: Date of Service: [name] 04/13/2023 12:00 PM Medical Record Number: [id number] Patient Account Number: [id number] Date of Birth/Sex: Treating RN: November 06, 1950 (72 y.o. [name] Primary Care Tallon Gertz: [name] Other Clinician: [name] Referring [name]: Treating Calab Sachse/Extender: [name]: 6 Vital Signs Time Taken: 11:56 Capillary Blood Glucose (mg/dl): 202 Height (in): 67 Reference Range: 80 - 120 mg / dl Weight (lbs): 542 Body Mass Index (BMI): 24.1 Electronic Signature(s) Signed: 04/13/2023 2:59:08 PM By: [name] EMT Entered By: [name] on 04/13/2023 14:59:08

## 2023-04-13 NOTE — Progress Notes (Addendum)
REGEN, [name] ([id number]) [id number] Page 1 of 2 Visit Report for 04/13/2023 HBO Details Patient Name: Date of Service: [name] 04/13/2023 12:00 PM Medical Record Number: [id number] Patient Account Number: [id number] Date of Birth/Sex: Treating RN: 10-30-1950 (72 y.o. [name], [name] Primary Care Demitria Hay: [name] Other Clinician: [name] Referring Luva Metzger: Treating Kajal Scalici/Extender: [name] in Treatment: 6 HBO Treatment Course Details Treatment Course Number: 1 Ordering Tyrrell Stephens: [name] T Treatments Ordered: otal 40 HBO Treatment Start Date: 04/06/2023 HBO Indication: Diabetic Ulcer(s) of the Lower Extremity Standard/Conservative Wound Care tried and failed greater than or equal to 30 days Wound #8 Right T Great oe HBO Treatment Details Treatment Number: 5 Patient Type: Outpatient Chamber Type: Monoplace Chamber Serial #: [id number] Treatment Protocol: 2.5 ATA with 90 minutes oxygen, with two 5 minute air breaks Treatment Details Compression Rate Down: 2.0 psi / minute De-Compression Rate Up: 2.0 psi / minute A breaks and breathing ir Compress Tx Pressure periods Decompress Decompress Begins Reached (leave unused spaces Begins Ends blank) Chamber Pressure (ATA 1 2.5 2.5 2.5 2.5 2.5 - - 2.5 1 ) Clock Time (24 hr) 12:42 12:55 13:25 13:30 14:00 14:05 - - 14:35 14:46 Treatment Length: 124 (minutes) Treatment Segments: 4 Vital Signs Capillary Blood Glucose Reference Range: 80 - 120 mg / dl HBO Diabetic Blood Glucose Intervention Range: <131 mg/dl or >324 mg/dl Time Vitals Blood Respiratory Capillary Blood Glucose Pulse Action Type: Pulse: Temperature: Taken: Pressure: Rate: Glucose (mg/dl): Meter #: Oximetry (%) Taken: Pre 11:56 202 See note below Post 14:50 153/80 72 20 97.5 201 Pre 12:28 115/79 70 18 98.4 200 Treatment Response Treatment Toleration: Well Treatment  Completion Status: Treatment Completed without Adverse Event Treatment Notes Per orders the patient was given 8 oz of Glucerna to drink before treatment. Additional Procedure Documentation Tissue Sevierity: Necrosis of bone Physician HBO Attestation: I certify that I supervised this HBO treatment in accordance with Medicare guidelines. A trained emergency response team is readily available per Yes hospital policies and procedures. Continue HBOT as ordered. Yes Electronic Signature(s) Signed: 04/13/2023 3:27:52 PM By: [name] MD FACS Previous Signature: 04/13/2023 3:04:36 PM Version By: [name] EMT Entered By: [name] on 04/13/2023 15:27:51 Ramus, [name] ([id number]) [id number] Page 2 of 2 -------------------------------------------------------------------------------- HBO Safety Checklist Details Patient Name: Date of Service: [name] 04/13/2023 12:00 PM Medical Record Number: [id number] Patient Account Number: [id number] Date of Birth/Sex: Treating RN: 21-Mar-1951 (72 y.o. [name], [name] Primary Care Kesean Serviss: [name] Other Clinician: [name] Referring Panayiota Larkin: Treating Athleen Feltner/Extender: [name]: 6 HBO Safety Checklist Items Safety Checklist Consent Form Signed Patient voided / foley secured and emptied When did you last eato 1030 Last dose of injectable or oral agent Yesterday Ostomy pouch emptied and vented if applicable NA All implantable devices assessed, documented and approved NA Intravenous access site secured and place NA Valuables secured Linens and cotton and cotton/polyester blend (less than 51% polyester) Personal oil-based products / skin lotions / body lotions removed Wigs or hairpieces removed NA Smoking or tobacco materials removed Books / newspapers / magazines / loose paper removed Cologne, aftershave, perfume and deodorant  removed Jewelry removed (may wrap wedding band) Make-up removed NA Hair care products removed Battery operated devices (external) removed Heating patches and chemical warmers removed Titanium eyewear removed NA Nail polish cured greater than 10 hours NA Casting material cured greater than 10 hours NA Hearing aids removed NA Loose dentures or partials  removed removed by patient Prosthetics have been removed NA Patient demonstrates correct use of air break device (if applicable) Patient concerns have been addressed Patient grounding bracelet on and cord attached to chamber Specifics for Inpatients (complete in addition to above) Medication sheet sent with patient NA Intravenous medications needed or due during therapy sent with patient NA Drainage tubes (e.g. nasogastric tube or chest tube secured and vented) NA Endotracheal or Tracheotomy tube secured NA Cuff deflated of air and inflated with saline NA Airway suctioned NA Notes The safety checklist was sone before the treatment was started. Electronic Signature(s) Signed: 04/13/2023 3:00:31 PM By: [name] EMT Entered By: [name] on 04/13/2023 15:00:31

## 2023-04-14 ENCOUNTER — Encounter (HOSPITAL_BASED_OUTPATIENT_CLINIC_OR_DEPARTMENT_OTHER): Payer: Medicare HMO | Admitting: General Surgery

## 2023-04-14 DIAGNOSIS — N1832 Chronic kidney disease, stage 3b: Secondary | ICD-10-CM | POA: Diagnosis not present

## 2023-04-14 DIAGNOSIS — E1122 Type 2 diabetes mellitus with diabetic chronic kidney disease: Secondary | ICD-10-CM | POA: Diagnosis not present

## 2023-04-14 DIAGNOSIS — L97514 Non-pressure chronic ulcer of other part of right foot with necrosis of bone: Secondary | ICD-10-CM | POA: Diagnosis not present

## 2023-04-14 DIAGNOSIS — I129 Hypertensive chronic kidney disease with stage 1 through stage 4 chronic kidney disease, or unspecified chronic kidney disease: Secondary | ICD-10-CM | POA: Diagnosis not present

## 2023-04-14 DIAGNOSIS — E11621 Type 2 diabetes mellitus with foot ulcer: Secondary | ICD-10-CM | POA: Diagnosis not present

## 2023-04-14 DIAGNOSIS — M86171 Other acute osteomyelitis, right ankle and foot: Secondary | ICD-10-CM | POA: Diagnosis not present

## 2023-04-14 LAB — GLUCOSE, CAPILLARY
Glucose-Capillary: 197 mg/dL — ABNORMAL HIGH (ref 70–99)
Glucose-Capillary: 249 mg/dL — ABNORMAL HIGH (ref 70–99)

## 2023-04-14 NOTE — Progress Notes (Signed)
[name], [name] ([id number]) [id number] Page 1 of 2 Visit Report for 04/14/2023 Arrival Information Details Patient Name: Date of Service: [name] 04/14/2023 8:00 A M Medical Record Number: [id number] Patient Account Number: [id number] Date of Birth/Sex: Treating RN: 1951-05-13 (72 y.o. [name] Primary Care [name]: [name] Other Clinician: Karl [name] Referring [name]: Treating [name]/Extender: [name] in Treatment: 6 Visit Information History Since Last Visit All ordered tests and consults were completed: Yes Patient Arrived: Ambulatory Added or deleted any medications: No Arrival Time: 07:52 Any new allergies or adverse reactions: No Accompanied By: None Had a fall or experienced change in No Transfer Assistance: None activities of daily living that may affect Patient Identification Verified: Yes risk of falls: Secondary Verification Process Completed: Yes Signs or symptoms of abuse/neglect since last visito No Patient Requires Transmission-Based Precautions: No Hospitalized since last visit: No Patient Has Alerts: Yes Implantable device outside of the clinic excluding No Patient Alerts: Patient on Blood Thinner cellular tissue based products placed in the center Plavix since last visit: ABI R [name] Pain Present Now: No Electronic Signature(s) Signed: 04/14/2023 1:48:23 PM By: [name] EMT Entered By: [name] on 04/14/2023 13:48:23 -------------------------------------------------------------------------------- Encounter Discharge Information Details Patient Name: Date of Service: [name] 04/14/2023 8:00 A M Medical Record Number: [id number] Patient Account Number: [id number] Date of Birth/Sex: Treating RN: Oct 17, 1951 (72 y.o. [name] Primary Care [name]: [name] Other Clinician: Karl [name] Referring [name]: Treating  [name]/Extender: [name] in Treatment: 6 Encounter Discharge Information Items Discharge Condition: Stable Ambulatory Status: Ambulatory Discharge Destination: Home Transportation: Private Auto Accompanied By: None Schedule Follow-up Appointment: Yes Clinical Summary of Care: Electronic Signature(s) Signed: 04/14/2023 1:55:19 PM By: [name] EMT Entered By: [name] on 04/14/2023 13:55:19 [name], [name] ([id number]) [id number] Page 2 of 2 -------------------------------------------------------------------------------- Vitals Details Patient Name: Date of Service: [name] 04/14/2023 8:00 A M Medical Record Number: [id number] Patient Account Number: [id number] Date of Birth/Sex: Treating RN: 03/02/1951 (72 y.o. [name] Primary Care [name]: [name] Other Clinician: Karl [name] Referring [name]: Treating [name]/Extender: [name] Weeks in Treatment: 6 Vital Signs Time Taken: 07:58 Temperature (F): 197 Height (in): 67 Pulse (bpm): 69 Weight (lbs): 154 Respiratory Rate (breaths/min): 20 Body Mass Index (BMI): 24.1 Blood Pressure (mmHg): 158/86 Capillary Blood Glucose (mg/dl): 220 Reference Range: 80 - 120 mg / dl Electronic Signature(s) Signed: 04/14/2023 1:48:55 PM By: [name] EMT Entered By: [name] on 04/14/2023 13:48:55

## 2023-04-14 NOTE — Progress Notes (Addendum)
[place], [name] ([id number]) [id number] Page 1 of 2 Visit Report for 04/14/2023 HBO Details Patient Name: Date of Service: [name] 04/14/2023 8:00 A M Medical Record Number: [id number] Patient Account Number: [id number] Date of Birth/Sex: Treating RN: Mar 10, 1951 (72 y.o. [name] Primary Care [name]: [name] Other Clinician: Karl [name] Referring [name]: Treating [name]/Extender: [name] in Treatment: 6 HBO Treatment Course Details Treatment Course Number: 1 Ordering [name]: [name] T Treatments Ordered: otal 40 HBO Treatment Start Date: 04/06/2023 HBO Indication: Diabetic Ulcer(s) of the Lower Extremity Standard/Conservative Wound Care tried and failed greater than or equal to 30 days Wound #8 Right T Great oe HBO Treatment Details Treatment Number: 6 Patient Type: Outpatient Chamber Type: Monoplace Chamber Serial #: [id number] Treatment Protocol: 2.5 ATA with 90 minutes oxygen, with two 5 minute air breaks Treatment Details Compression Rate Down: 2.0 psi / minute De-Compression Rate Up: 2.0 psi / minute A breaks and breathing ir Compress Tx Pressure periods Decompress Decompress Begins Reached (leave unused spaces Begins Ends blank) Chamber Pressure (ATA 1 2.5 2.5 2.5 2.5 2.5 - - 2.5 1 ) Clock Time (24 hr) 08:12 08:24 08:54 08:59 09:29 09:34 - - 10:04 10:16 Treatment Length: 124 (minutes) Treatment Segments: 4 Vital Signs Capillary Blood Glucose Reference Range: 80 - 120 mg / dl HBO Diabetic Blood Glucose Intervention Range: <131 mg/dl or >536 mg/dl Time Vitals Blood Respiratory Capillary Blood Glucose Pulse Action Type: Pulse: Temperature: Taken: Pressure: Rate: Glucose (mg/dl): Meter #: Oximetry (%) Taken: Pre 07:58 158/86 69 20 197 197 Post 10:20 146/74 70 18 97.6 249 Treatment Response Treatment Toleration: Well Treatment Completion Status: Treatment  Completed without Adverse Event Treatment Notes Per orders the patient was given 8 oz of Glucerna to drink before treatment. Additional Procedure Documentation Tissue Sevierity: Necrosis of bone Physician HBO Attestation: I certify that I supervised this HBO treatment in accordance with Medicare guidelines. A trained emergency response team is readily available per Yes hospital policies and procedures. Continue HBOT as ordered. Yes Electronic Signature(s) Signed: 04/14/2023 3:00:29 PM By: [name] Guess MD FACS Previous Signature: 04/14/2023 1:53:16 PM Version By: [name] EMT Entered By: [name] on 04/14/2023 15:00:29 [name], [name] ([id number]) [id number] Page 2 of 2 -------------------------------------------------------------------------------- HBO Safety Checklist Details Patient Name: Date of Service: [name] 04/14/2023 8:00 A M Medical Record Number: [id number] Patient Account Number: [id number] Date of Birth/Sex: Treating RN: [birth year] (72 y.o. [name] Primary Care [name]: [name] Other Clinician: Karl [name] Referring [name]: Treating [name]/Extender: [name] Weeks in Treatment: 6 HBO Safety Checklist Items Safety Checklist Consent Form Signed Patient voided / foley secured and emptied When did you last eato 0600 Last dose of injectable or oral agent Yesterday Ostomy pouch emptied and vented if applicable NA All implantable devices assessed, documented and approved NA Intravenous access site secured and place NA Valuables secured Linens and cotton and cotton/polyester blend (less than 51% polyester) Personal oil-based products / skin lotions / body lotions removed Wigs or hairpieces removed NA Smoking or tobacco materials removed Books / newspapers / magazines / loose paper removed Cologne, aftershave, perfume and deodorant removed Jewelry removed (may wrap  wedding band) Make-up removed NA Hair care products removed Battery operated devices (external) removed Heating patches and chemical warmers removed Titanium eyewear removed NA Nail polish cured greater than 10 hours NA Casting material cured greater than 10 hours NA Hearing aids removed NA Loose dentures or partials removed removed by patient  Prosthetics have been removed NA Patient demonstrates correct use of air break device (if applicable) Patient concerns have been addressed Patient grounding bracelet on and cord attached to chamber Specifics for Inpatients (complete in addition to above) Medication sheet sent with patient NA Intravenous medications needed or due during therapy sent with patient NA Drainage tubes (e.g. nasogastric tube or chest tube secured and vented) NA Endotracheal or Tracheotomy tube secured NA Cuff deflated of air and inflated with saline NA Airway suctioned NA Notes The safety checklist was sone before the treatment was started. Electronic Signature(s) Signed: 04/14/2023 2:04:08 PM By: [name] EMT Previous Signature: 04/14/2023 1:50:38 PM Version By: [name] EMT Entered By: [name] on 04/14/2023 14:04:07

## 2023-04-14 NOTE — Progress Notes (Signed)
[name], [name] ([id number]) [id number] Page 1 of 2 Visit Report for 04/08/2023 Problem List Details Patient Name: Date of Service: [name] 04/08/2023 8:00 A M Medical Record Number: [id number] Patient Account Number: [id number] Date of Birth/Sex: Treating RN: 10-May-1951 (72 y.o. 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 03/02/2023 No Yes necrosis of bone L97.512 Non-pressure chronic ulcer of other part of right foot with fat 03/09/2023 No Yes layer exposed M86.171 Other acute osteomyelitis, right ankle and foot 03/02/2023 No Yes E11.621 Type 2 diabetes mellitus with foot ulcer 03/02/2023 No Yes I10 Essential (primary) hypertension 03/02/2023 No Yes N18.32 Chronic kidney disease, stage 3b 03/02/2023 No Yes Inactive Problems Resolved Problems Electronic Signature(s) Signed: 04/08/2023 4:16:31 PM By: [name] MD FACS Signed: 04/14/2023 2:46:50 PM By: [name] EMT Entered By: [name] on 04/08/2023 15:07:04 -------------------------------------------------------------------------------- SuperBill Details Patient Name: Date of Service: [name], [name] 04/08/2023 Medical Record Number: [id number] Patient Account Number: [id number] Date of Birth/Sex: Treating RN: October 19, 1951 (72 y.o. [name] Primary Care Provider: [name] Other Clinician: [name] Referring Provider: Treating Provider/Extender: [name] [place], [place] ([id number]) [phone].pdf Page 2 of 2 Weeks in Treatment: 5 Diagnosis Coding ICD-10 Codes Code Description L97.514 Non-pressure chronic ulcer of other part  of right foot with necrosis of bone L97.512 Non-pressure chronic ulcer of other part of right foot with fat layer exposed M86.171 Other acute osteomyelitis, right ankle and foot E11.621 Type 2 diabetes mellitus with foot ulcer I10 Essential (primary) hypertension N18.32 Chronic kidney disease, stage 3b Facility Procedures : CPT4 Code Description: [id number] G0277-(Facility Use Only) HBOT full body chamber, , ICD-10 Diagnosis Description M86.171 Other acute osteomyelitis, right ankle and foot L97.514 Non-pressure chronic ulcer of other part of right foot wit L97.512  Non-pressure chronic ulcer of other part of right foot wit E11.621 Type 2 diabetes mellitus with foot ulcer Modifier: h necrosis of h fat layer e Quantity: 4 bone xposed Physician Procedures : CPT4 Code Description Modifier [id number] - WC PHYS HYPERBARIC OXYGEN THERAPY ICD-10 Diagnosis Description M86.171 Other acute osteomyelitis, right ankle and foot L97.514 Non-pressure chronic ulcer of other part of right foot with necrosis of  L97.512 Non-pressure chronic ulcer of other part of right foot with fat layer e E11.621 Type 2 diabetes mellitus with foot ulcer Quantity: 1 bone xposed Electronic Signature(s) Signed: 04/08/2023 3:06:55 PM By: [name] EMT Signed: 04/08/2023 4:16:31 PM By: [name] MD FACS Entered By: [name] on 04/08/2023 15:06:55

## 2023-04-14 NOTE — Progress Notes (Signed)
REMONE, SAPP ([id number]) [id number] Page 1 of 2 Visit Report for 04/14/2023 Problem List Details Patient Name: Date of Service: [name] 04/14/2023 8:00 A M Medical Record Number: [id number] Patient Account Number: [id number] Date of Birth/Sex: Treating RN: 1951-09-28 (72 y.o. 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 03/02/2023 No Yes necrosis of bone M86.171 Other acute osteomyelitis, right ankle and foot 03/02/2023 No Yes E11.621 Type 2 diabetes mellitus with foot ulcer 03/02/2023 No Yes I10 Essential (primary) hypertension 03/02/2023 No Yes N18.32 Chronic kidney disease, stage 3b 03/02/2023 No Yes Inactive Problems Resolved Problems ICD-10 Code Description Active Date Resolved Date L97.512 Non-pressure chronic ulcer of other part of right foot with fat layer 03/09/2023 03/09/2023 exposed Electronic Signature(s) Signed: 04/14/2023 1:53:49 PM By: [name] EMT Signed: 04/14/2023 2:59:01 PM By: [name] MD FACS Entered By: [name] on 04/14/2023 13:53:49 -------------------------------------------------------------------------------- SuperBill Details Patient Name: Date of Service: [name], [name] 04/14/2023 Medical Record Number: [id number] Patient Account Number: [id number] Date of Birth/Sex: Treating RN: 1951-01-31 (72 y.o. [name] [place], Yoshi ([id number]) [phone].pdf Page 2 of 2 Primary Care Provider: [name] Other Clinician: [name] Referring Provider: Treating Provider/Extender: [name] in Treatment: 6 Diagnosis Coding ICD-10 Codes Code  Description [phone] Non-pressure chronic ulcer of other part of right foot with necrosis of bone L97.512 Non-pressure chronic ulcer of other part of right foot with fat layer exposed M86.171 Other acute osteomyelitis, right ankle and foot E11.621 Type 2 diabetes mellitus with foot ulcer I10 Essential (primary) hypertension N18.32 Chronic kidney disease, stage 3b Facility Procedures : CPT4 Code Description: [id number] G0277-(Facility Use Only) HBOT full body chamber, , ICD-10 Diagnosis Description M86.171 Other acute osteomyelitis, right ankle and foot L97.514 Non-pressure chronic ulcer of other part of right foot wit L97.512  Non-pressure chronic ulcer of other part of right foot wit E11.621 Type 2 diabetes mellitus with foot ulcer Modifier: h necrosis o h fat layer Quantity: 4 f bone exposed Physician Procedures : CPT4 Code Description Modifier [id number] 99183 - WC PHYS HYPERBARIC OXYGEN THERAPY ICD-10 Diagnosis Description M86.171 Other acute osteomyelitis, right ankle and foot L97.514 Non-pressure chronic ulcer of other part of right foot with necrosis o L97.512  Non-pressure chronic ulcer of other part of right foot with fat layer E11.621 Type 2 diabetes mellitus with foot ulcer Quantity: 1 f bone exposed Electronic Signature(s) Signed: 04/14/2023 1:53:44 PM By: [name] EMT Signed: 04/14/2023 2:59:01 PM By: [name] MD FACS Entered By: [name] on 04/14/2023 13:53:44

## 2023-04-15 ENCOUNTER — Encounter (HOSPITAL_BASED_OUTPATIENT_CLINIC_OR_DEPARTMENT_OTHER): Payer: Medicare HMO | Admitting: General Surgery

## 2023-04-15 DIAGNOSIS — I129 Hypertensive chronic kidney disease with stage 1 through stage 4 chronic kidney disease, or unspecified chronic kidney disease: Secondary | ICD-10-CM | POA: Diagnosis not present

## 2023-04-15 DIAGNOSIS — N1832 Chronic kidney disease, stage 3b: Secondary | ICD-10-CM | POA: Diagnosis not present

## 2023-04-15 DIAGNOSIS — E1122 Type 2 diabetes mellitus with diabetic chronic kidney disease: Secondary | ICD-10-CM | POA: Diagnosis not present

## 2023-04-15 DIAGNOSIS — L97514 Non-pressure chronic ulcer of other part of right foot with necrosis of bone: Secondary | ICD-10-CM | POA: Diagnosis not present

## 2023-04-15 DIAGNOSIS — M86171 Other acute osteomyelitis, right ankle and foot: Secondary | ICD-10-CM | POA: Diagnosis not present

## 2023-04-15 DIAGNOSIS — E11621 Type 2 diabetes mellitus with foot ulcer: Secondary | ICD-10-CM | POA: Diagnosis not present

## 2023-04-15 LAB — GLUCOSE, CAPILLARY
Glucose-Capillary: 175 mg/dL — ABNORMAL HIGH (ref 70–99)
Glucose-Capillary: 226 mg/dL — ABNORMAL HIGH (ref 70–99)

## 2023-04-15 NOTE — Progress Notes (Signed)
Sandwich, [name] ([id number]) [id number] Page 1 of 2 Visit Report for 04/15/2023 HBO Details Patient Name: Date of Service: [name] 04/15/2023 8:00 A M Medical Record Number: [id number] Patient Account Number: [id number] Date of Birth/Sex: Treating RN: 1951/07/14 (72 y.o. [name] Primary Care Maxine Huynh: [name] Other Clinician: [name] Referring Willem Klingensmith: Treating Ineze Serrao/Extender: [name] in Treatment: 6 HBO Treatment Course Details Treatment Course Number: 1 Ordering Cimone Fahey: [name] T Treatments Ordered: otal 40 HBO Treatment Start Date: 04/06/2023 HBO Indication: Diabetic Ulcer(s) of the Lower Extremity Standard/Conservative Wound Care tried and failed greater than or equal to 30 days Wound #8 Right T Great oe HBO Treatment Details Treatment Number: 7 Patient Type: Outpatient Chamber Type: Monoplace Chamber Serial #: [id number] Treatment Protocol: 2.5 ATA with 90 minutes oxygen, with two 5 minute air breaks Treatment Details Compression Rate Down: 2.0 psi / minute De-Compression Rate Up: 2.0 psi / minute A breaks and breathing ir Compress Tx Pressure periods Decompress Decompress Begins Reached (leave unused spaces Begins Ends blank) Chamber Pressure (ATA 1 2.5 2.5 2.5 2.5 2.5 - - 2.5 1 ) Clock Time (24 hr) 08:20 08:31 09:01 09:06 09:36 09:41 - - 10:11 10:21 Treatment Length: 121 (minutes) Treatment Segments: 4 Vital Signs Capillary Blood Glucose Reference Range: 80 - 120 mg / dl HBO Diabetic Blood Glucose Intervention Range: <131 mg/dl or >253 mg/dl Time Vitals Blood Respiratory Capillary Blood Glucose Pulse Action Type: Pulse: Temperature: Taken: Pressure: Rate: Glucose (mg/dl): Meter #: Oximetry (%) Taken: Pre 08:03 149/89 71 18 97.9 175 Post 10:24 118/95 81 20 98 226 Treatment Response Treatment Toleration: Well Treatment Completion Status: Treatment Completed  without Adverse Event Treatment Notes Per orders the patient was given 8 oz of Glucerna to drink before treatment. Additional Procedure Documentation Tissue Sevierity: Necrosis of bone Physician HBO Attestation: I certify that I supervised this HBO treatment in accordance with Medicare guidelines. A trained emergency response team is readily available per Yes hospital policies and procedures. Continue HBOT as ordered. Yes Electronic Signature(s) Signed: 04/15/2023 2:18:17 PM By: [name] MD FACS Previous Signature: 04/15/2023 2:00:17 PM Version By: [name] EMT Entered By: [name] on 04/15/2023 14:18:16 [place]ampa, Broughton ([id number]) [id number] Page 2 of 2 -------------------------------------------------------------------------------- HBO Safety Checklist Details Patient Name: Date of Service: [name] 04/15/2023 8:00 A M Medical Record Number: [id number] Patient Account Number: [id number] Date of Birth/Sex: Treating RN: [birth year] (72 y.o. [name], Bonita Quin Primary Care Shashana Fullington: [name] Other Clinician: [name] Referring Pailynn Vahey: Treating Ariaunna Longsworth/Extender: [name]: 6 HBO Safety Checklist Items Safety Checklist Consent Form Signed Patient voided / foley secured and emptied When did you last eato 0730 Last dose of injectable or oral agent Last night Ostomy pouch emptied and vented if applicable NA All implantable devices assessed, documented and approved NA Intravenous access site secured and place NA Valuables secured Linens and cotton and cotton/polyester blend (less than 51% polyester) Personal oil-based products / skin lotions / body lotions removed Wigs or hairpieces removed NA Smoking or tobacco materials removed Books / newspapers / magazines / loose paper removed Cologne, aftershave, perfume and deodorant removed Jewelry removed (may wrap wedding  band) Make-up removed NA Hair care products removed Battery operated devices (external) removed Heating patches and chemical warmers removed Titanium eyewear removed NA Nail polish cured greater than 10 hours NA Casting material cured greater than 10 hours NA Hearing aids removed NA Loose dentures or partials removed removed by  patient Prosthetics have been removed NA Patient demonstrates correct use of air break device (if applicable) Patient concerns have been addressed Patient grounding bracelet on and cord attached to chamber Specifics for Inpatients (complete in addition to above) Medication sheet sent with patient NA Intravenous medications needed or due during therapy sent with patient NA Drainage tubes (e.g. nasogastric tube or chest tube secured and vented) NA Endotracheal or Tracheotomy tube secured NA Cuff deflated of air and inflated with saline NA Airway suctioned NA Notes The safety checklist was sone before the treatment was started. Electronic Signature(s) Signed: 04/15/2023 2:01:44 PM By: [name] EMT Previous Signature: 04/15/2023 1:53:14 PM Version By: [name] EMT Entered By: [name] on 04/15/2023 14:01:44

## 2023-04-15 NOTE — Progress Notes (Signed)
TRAXTON, CATCHING ([id number]) [id number] Page 1 of 2 Visit Report for 04/15/2023 Arrival Information Details Patient Name: Date of Service: [name] 04/15/2023 8:00 A M Medical Record Number: [id number] Patient Account Number: [id number] Date of Birth/Sex: Treating RN: [name] (72 y.o. [name] Primary Care [name]: [name] Other Clinician: [name] Referring [name]: Treating [name]/Extender: [name] in Treatment: 6 Visit Information History Since Last Visit All ordered tests and consults were completed: Yes Patient Arrived: Ambulatory Added or deleted any medications: No Arrival Time: 07:51 Any new allergies or adverse reactions: No Accompanied By: None Had a fall or experienced change in No Transfer Assistance: None activities of daily living that may affect Patient Identification Verified: Yes risk of falls: Secondary Verification Process Completed: Yes Signs or symptoms of abuse/neglect since last visito No Patient Requires Transmission-Based Precautions: No Hospitalized since last visit: No Patient Has Alerts: Yes Implantable device outside of the clinic excluding No Patient Alerts: Patient on Blood Thinner cellular tissue based products placed in the center Plavix since last visit: ABI R [place] Pain Present Now: No Electronic Signature(s) Signed: 04/15/2023 1:51:25 PM By: [name] EMT Entered By: [name] on 04/15/2023 13:51:25 -------------------------------------------------------------------------------- Encounter Discharge Information Details Patient Name: Date of Service: [name], [name] 04/15/2023 8:00 A M Medical Record Number: [id number] Patient Account Number: [id number] Date of Birth/Sex: Treating RN: [name] (72 y.o. [name] Primary Care [name]: [name] Other Clinician: [name] Referring [name]: Treating  [name]/Extender: [name] in Treatment: 6 Encounter Discharge Information Items Discharge Condition: Stable Ambulatory Status: Ambulatory Discharge Destination: Home Transportation: Private Auto Accompanied By: None Schedule Follow-up Appointment: Yes Clinical Summary of Care: Electronic Signature(s) Signed: 04/15/2023 2:02:35 PM By: [name] EMT Entered By: [name] on 04/15/2023 14:02:35 [name] ([id number]) [id number] Page 2 of 2 -------------------------------------------------------------------------------- Vitals Details Patient Name: Date of Service: [name] 04/15/2023 8:00 A M Medical Record Number: [id number] Patient Account Number: [id number] Date of Birth/Sex: Treating RN: [name] (72 y.o. [name] Primary Care [name]: [name] Other Clinician: [name] Referring [name]: Treating [name]/Extender: [name] in Treatment: 6 Vital Signs Time Taken: 08:03 Temperature (F): 97.9 Height (in): 67 Pulse (bpm): 71 Weight (lbs): 154 Respiratory Rate (breaths/min): 18 Body Mass Index (BMI): 24.1 Blood Pressure (mmHg): 149/89 Capillary Blood Glucose (mg/dl): 220 Reference Range: 80 - 120 mg / dl Electronic Signature(s) Signed: 04/15/2023 1:52:00 PM By: [name] EMT Entered By: [name] on 04/15/2023 13:52:00

## 2023-04-15 NOTE — Progress Notes (Signed)
ANGELUS, MASA ([id number]) [id number] Page 1 of 2 Visit Report for 04/15/2023 Problem List Details Patient Name: Date of Service: [name] 04/15/2023 8:00 A M Medical Record Number: [id number] Patient Account Number: [id number] Date of Birth/Sex: Treating RN: 04/05/1951 (72 y.o. 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 03/02/2023 No Yes necrosis of bone M86.171 Other acute osteomyelitis, right ankle and foot 03/02/2023 No Yes E11.621 Type 2 diabetes mellitus with foot ulcer 03/02/2023 No Yes I10 Essential (primary) hypertension 03/02/2023 No Yes N18.32 Chronic kidney disease, stage 3b 03/02/2023 No Yes Inactive Problems Resolved Problems ICD-10 Code Description Active Date Resolved Date L97.512 Non-pressure chronic ulcer of other part of right foot with fat layer 03/09/2023 03/09/2023 exposed Electronic Signature(s) Signed: 04/15/2023 2:02:00 PM By: [name] EMT Signed: 04/15/2023 2:17:20 PM By: [name] MD FACS Entered By: [name] on 04/15/2023 14:02:00 -------------------------------------------------------------------------------- SuperBill Details Patient Name: Date of Service: [name], [name] 04/15/2023 Medical Record Number: [id number] Patient Account Number: [id number] Date of Birth/Sex: Treating RN: 19-Nov-1950 (72 y.o. [name] [place], [place] ([id number]) [id number].pdf Page 2 of 2 Primary Care Provider: [name] Other Clinician: [name] Referring Provider: Treating Provider/Extender: [name] in Treatment: 6 Diagnosis Coding ICD-10 Codes Code  Description [phone] Non-pressure chronic ulcer of other part of right foot with necrosis of bone L97.512 Non-pressure chronic ulcer of other part of right foot with fat layer exposed M86.171 Other acute osteomyelitis, right ankle and foot E11.621 Type 2 diabetes mellitus with foot ulcer I10 Essential (primary) hypertension N18.32 Chronic kidney disease, stage 3b Facility Procedures : CPT4 Code Description: [id number] G0277-(Facility Use Only) HBOT full body chamber, , ICD-10 Diagnosis Description M86.171 Other acute osteomyelitis, right ankle and foot L97.514 Non-pressure chronic ulcer of other part of right foot wit L97.512  Non-pressure chronic ulcer of other part of right foot wit E11.621 Type 2 diabetes mellitus with foot ulcer Modifier: h necrosis o h fat layer Quantity: 4 f bone exposed Physician Procedures : CPT4 Code Description Modifier [id number] 99183 - WC PHYS HYPERBARIC OXYGEN THERAPY ICD-10 Diagnosis Description M86.171 Other acute osteomyelitis, right ankle and foot L97.514 Non-pressure chronic ulcer of other part of right foot with necrosis o L97.512  Non-pressure chronic ulcer of other part of right foot with fat layer E11.621 Type 2 diabetes mellitus with foot ulcer Quantity: 1 f bone exposed Electronic Signature(s) Signed: 04/15/2023 2:01:20 PM By: [name] EMT Signed: 04/15/2023 2:17:20 PM By: [name] MD FACS Entered By: [name] on 04/15/2023 14:01:20

## 2023-04-16 ENCOUNTER — Encounter (HOSPITAL_BASED_OUTPATIENT_CLINIC_OR_DEPARTMENT_OTHER): Payer: Medicare HMO | Admitting: General Surgery

## 2023-04-16 DIAGNOSIS — M86171 Other acute osteomyelitis, right ankle and foot: Secondary | ICD-10-CM | POA: Diagnosis not present

## 2023-04-16 DIAGNOSIS — N1832 Chronic kidney disease, stage 3b: Secondary | ICD-10-CM | POA: Diagnosis not present

## 2023-04-16 DIAGNOSIS — E1122 Type 2 diabetes mellitus with diabetic chronic kidney disease: Secondary | ICD-10-CM | POA: Diagnosis not present

## 2023-04-16 DIAGNOSIS — E11621 Type 2 diabetes mellitus with foot ulcer: Secondary | ICD-10-CM | POA: Diagnosis not present

## 2023-04-16 DIAGNOSIS — I129 Hypertensive chronic kidney disease with stage 1 through stage 4 chronic kidney disease, or unspecified chronic kidney disease: Secondary | ICD-10-CM | POA: Diagnosis not present

## 2023-04-16 DIAGNOSIS — L97514 Non-pressure chronic ulcer of other part of right foot with necrosis of bone: Secondary | ICD-10-CM | POA: Diagnosis not present

## 2023-04-16 LAB — GLUCOSE, CAPILLARY
Glucose-Capillary: 192 mg/dL — ABNORMAL HIGH (ref 70–99)
Glucose-Capillary: 227 mg/dL — ABNORMAL HIGH (ref 70–99)

## 2023-04-16 NOTE — Progress Notes (Addendum)
[name], [name] ([id number]) [id number] Page 1 of 2 Visit Report for 04/16/2023 HBO Details Patient Name: Date of Service: [name] 04/16/2023 8:00 A M Medical Record Number: [id number] Patient Account Number: [id number] Date of Birth/Sex: Treating RN: 1951/07/24 (72 y.o. [name] Primary Care [name]: [name] Other Clinician: Karl [name] Referring [name]: Treating [name]/Extender: [name] in Treatment: 6 HBO Treatment Course Details Treatment Course Number: 1 Ordering [name]: [name] T Treatments Ordered: otal 40 HBO Treatment Start Date: 04/06/2023 HBO Indication: Diabetic Ulcer(s) of the Lower Extremity Standard/Conservative Wound Care tried and failed greater than or equal to 30 days Wound #8 Right T Great oe HBO Treatment Details Treatment Number: 8 Patient Type: Outpatient Chamber Type: Monoplace Chamber Serial #: [id number] Treatment Protocol: 2.5 ATA with 90 minutes oxygen, with two 5 minute air breaks Treatment Details Compression Rate Down: 2.0 psi / minute De-Compression Rate Up: 2.0 psi / minute A breaks and breathing ir Compress Tx Pressure periods Decompress Decompress Begins Reached (leave unused spaces Begins Ends blank) Chamber Pressure (ATA 1 2.5 2.5 2.5 2.5 2.5 - - 2.5 1 ) Clock Time (24 hr) 08:18 08:27 08:57 09:02 09:32 09:37 - - 10:07 10:17 Treatment Length: 119 (minutes) Treatment Segments: 4 Vital Signs Capillary Blood Glucose Reference Range: 80 - 120 mg / dl HBO Diabetic Blood Glucose Intervention Range: <131 mg/dl or >102 mg/dl Time Vitals Blood Respiratory Capillary Blood Glucose Pulse Action Type: Pulse: Temperature: Taken: Pressure: Rate: Glucose (mg/dl): Meter #: Oximetry (%) Taken: Pre 07:59 152/78 67 18 97.5 192 Post 10:21 148/81 73 18 97.9 227 Treatment Response Treatment Toleration: Well Treatment Completion Status: Treatment Completed  without Adverse Event Treatment Notes Per orders the patient was given 8 oz of Glucerna to drink before treatment. Additional Procedure Documentation Tissue Sevierity: Necrosis of bone Physician HBO Attestation: I certify that I supervised this HBO treatment in accordance with Medicare guidelines. A trained emergency response team is readily available per Yes hospital policies and procedures. Continue HBOT as ordered. Yes Electronic Signature(s) Signed: 04/16/2023 3:26:20 PM By: [name] Guess MD FACS Previous Signature: 04/16/2023 1:11:30 PM Version By: [name] EMT Entered By: [name] on 04/16/2023 15:26:20 Blais, [name] ([id number]) [id number] Page 2 of 2 -------------------------------------------------------------------------------- HBO Safety Checklist Details Patient Name: Date of Service: [name] 04/16/2023 8:00 A M Medical Record Number: [id number] Patient Account Number: [id number] Date of Birth/Sex: Treating RN: Aug 30, 1951 (72 y.o. [name] Primary Care [name]: [name] Other Clinician: Karl [name] Referring Josephmichael Lisenbee: Treating [name]/Extender: [name] Weeks in Treatment: 6 HBO Safety Checklist Items Safety Checklist Consent Form Signed Patient voided / foley secured and emptied When did you last eato 0630 Last dose of injectable or oral agent Last night Ostomy pouch emptied and vented if applicable NA All implantable devices assessed, documented and approved NA Intravenous access site secured and place NA Valuables secured Linens and cotton and cotton/polyester blend (less than 51% polyester) Personal oil-based products / skin lotions / body lotions removed Wigs or hairpieces removed NA Smoking or tobacco materials removed Books / newspapers / magazines / loose paper removed Cologne, aftershave, perfume and deodorant removed Jewelry removed (may wrap wedding  band) Make-up removed NA Hair care products removed Battery operated devices (external) removed Heating patches and chemical warmers removed Titanium eyewear removed NA Nail polish cured greater than 10 hours NA Casting material cured greater than 10 hours NA Hearing aids removed NA Loose dentures or partials removed removed by  patient Prosthetics have been removed NA Patient demonstrates correct use of air break device (if applicable) Patient concerns have been addressed Patient grounding bracelet on and cord attached to chamber Specifics for Inpatients (complete in addition to above) Medication sheet sent with patient NA Intravenous medications needed or due during therapy sent with patient NA Drainage tubes (e.g. nasogastric tube or chest tube secured and vented) NA Endotracheal or Tracheotomy tube secured NA Cuff deflated of air and inflated with saline NA Airway suctioned NA Notes The safety checklist was sone before the treatment was started. Electronic Signature(s) Signed: 04/16/2023 1:09:25 PM By: [name] EMT Entered By: [name] on 04/16/2023 13:09:25

## 2023-04-16 NOTE — Progress Notes (Signed)
[name] ([id number]) [id number] Page 1 of 2 Visit Report for 04/16/2023 Arrival Information Details Patient Name: Date of Service: [name] 04/16/2023 8:00 A M Medical Record Number: [id number] Patient Account Number: [id number] Date of Birth/Sex: Treating RN: Mar 14, 1951 (72 y.o. [name] Primary Care [name]: [name] Other Clinician: Karl [name] Referring [name]: Treating [name]/Extender: [name] in Treatment: 6 Visit Information History Since Last Visit All ordered tests and consults were completed: Yes Patient Arrived: Ambulatory Added or deleted any medications: No Arrival Time: 07:56 Any new allergies or adverse reactions: No Accompanied By: none Had a fall or experienced change in No Transfer Assistance: None activities of daily living that may affect Patient Identification Verified: Yes risk of falls: Secondary Verification Process Completed: Yes Signs or symptoms of abuse/neglect since last visito No Patient Requires Transmission-Based Precautions: No Hospitalized since last visit: No Patient Has Alerts: Yes Implantable device outside of the clinic excluding No Patient Alerts: Patient on Blood Thinner cellular tissue based products placed in the center Plavix since last visit: ABI R [name] Pain Present Now: No Electronic Signature(s) Signed: 04/16/2023 1:07:19 PM By: [name] EMT Entered By: [name] on 04/16/2023 13:07:19 -------------------------------------------------------------------------------- Encounter Discharge Information Details Patient Name: Date of Service: [name] 04/16/2023 8:00 A M Medical Record Number: [id number] Patient Account Number: [id number] Date of Birth/Sex: Treating RN: 1951/01/28 (72 y.o. [name] Primary Care [name]: [name] Other Clinician: Karl [name] Referring Lauran Romanski: Treating [name]/Extender:  [name] in Treatment: 6 Encounter Discharge Information Items Discharge Condition: Stable Ambulatory Status: Ambulatory Discharge Destination: Home Transportation: Private Auto Accompanied By: none Schedule Follow-up Appointment: Yes Clinical Summary of Care: Electronic Signature(s) Signed: 04/16/2023 1:12:38 PM By: [name] EMT Entered By: [name] on 04/16/2023 13:12:38 [name], [name] ([id number]) [id number] Page 2 of 2 -------------------------------------------------------------------------------- Vitals Details Patient Name: Date of Service: [name] 04/16/2023 8:00 A M Medical Record Number: [id number] Patient Account Number: [id number] Date of Birth/Sex: Treating RN: [birth year] (72 y.o. [name] Primary Care [name]: [name] Other Clinician: Karl [name] Referring [name]: Treating [name]/Extender: [name] in Treatment: 6 Vital Signs Time Taken: 07:59 Temperature (F): 97.5 Height (in): 67 Pulse (bpm): 67 Weight (lbs): 154 Respiratory Rate (breaths/min): 18 Body Mass Index (BMI): 24.1 Blood Pressure (mmHg): 152/78 Capillary Blood Glucose (mg/dl): 220 Reference Range: 80 - 120 mg / dl Electronic Signature(s) Signed: 04/16/2023 1:08:14 PM By: [name] EMT Entered By: [name] on 04/16/2023 13:08:14

## 2023-04-16 NOTE — Progress Notes (Signed)
[name], [name] ([id number]) [id number] Page 1 of 2 Visit Report for 04/16/2023 Problem List Details Patient Name: Date of Service: [name] 04/16/2023 8:00 A M Medical Record Number: [id number] Patient Account Number: [id number] Date of Birth/Sex: Treating RN: Dec 06, 1950 (72 y.o. 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 M86.171 Other acute osteomyelitis, right ankle and foot 03/02/2023 No Yes E11.621 Type 2 diabetes mellitus with foot ulcer 03/02/2023 No Yes I10 Essential (primary) hypertension 03/02/2023 No Yes N18.32 Chronic kidney disease, stage 3b 03/02/2023 No Yes Inactive Problems Resolved Problems ICD-10 Code Description Active Date Resolved Date L97.512 Non-pressure chronic ulcer of other part of right foot with fat layer 03/09/2023 03/09/2023 exposed Electronic Signature(s) Signed: 04/16/2023 1:12:05 PM By: [name] EMT Signed: 04/16/2023 3:25:23 PM By: [name] MD FACS Entered By: [name] on 04/16/2023 13:12:04 -------------------------------------------------------------------------------- SuperBill Details Patient Name: Date of Service: [name], [name] 04/16/2023 Medical Record Number: [id number] Patient Account Number: [id number] Date of Birth/Sex: Treating RN: November 08, 1950 (72 y.o. [name] [place], Lamondre ([id number]) [phone].pdf Page 2 of 2 Primary Care Provider: [name] Other Clinician: [name] Referring Provider: Treating Provider/Extender: [name] in Treatment: 6 Diagnosis Coding ICD-10 Codes Code  Description [phone] Non-pressure chronic ulcer of other part of right foot with necrosis of bone L97.512 Non-pressure chronic ulcer of other part of right foot with fat layer exposed M86.171 Other acute osteomyelitis, right ankle and foot E11.621 Type 2 diabetes mellitus with foot ulcer I10 Essential (primary) hypertension N18.32 Chronic kidney disease, stage 3b Facility Procedures : CPT4 Code Description: [id number]-(Facility Use Only) HBOT full body chamber, , ICD-10 Diagnosis Description M86.171 Other acute osteomyelitis, right ankle and foot L97.514 Non-pressure chronic ulcer of other part of right foot wit L97.512  Non-pressure chronic ulcer of other part of right foot wit E11.621 Type 2 diabetes mellitus with foot ulcer Modifier: h necrosis o h fat layer Quantity: 4 f bone exposed Physician Procedures : CPT4 Code Description Modifier [id number] - WC PHYS HYPERBARIC OXYGEN THERAPY ICD-10 Diagnosis Description M86.171 Other acute osteomyelitis, right ankle and foot L97.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

## 2023-04-17 ENCOUNTER — Encounter (HOSPITAL_BASED_OUTPATIENT_CLINIC_OR_DEPARTMENT_OTHER): Payer: Medicare HMO | Admitting: General Surgery

## 2023-04-17 DIAGNOSIS — N1832 Chronic kidney disease, stage 3b: Secondary | ICD-10-CM | POA: Diagnosis not present

## 2023-04-17 DIAGNOSIS — E11621 Type 2 diabetes mellitus with foot ulcer: Secondary | ICD-10-CM | POA: Diagnosis not present

## 2023-04-17 DIAGNOSIS — M86171 Other acute osteomyelitis, right ankle and foot: Secondary | ICD-10-CM | POA: Diagnosis not present

## 2023-04-17 DIAGNOSIS — E1122 Type 2 diabetes mellitus with diabetic chronic kidney disease: Secondary | ICD-10-CM | POA: Diagnosis not present

## 2023-04-17 DIAGNOSIS — L97514 Non-pressure chronic ulcer of other part of right foot with necrosis of bone: Secondary | ICD-10-CM | POA: Diagnosis not present

## 2023-04-17 DIAGNOSIS — L97513 Non-pressure chronic ulcer of other part of right foot with necrosis of muscle: Secondary | ICD-10-CM | POA: Diagnosis not present

## 2023-04-17 DIAGNOSIS — I129 Hypertensive chronic kidney disease with stage 1 through stage 4 chronic kidney disease, or unspecified chronic kidney disease: Secondary | ICD-10-CM | POA: Diagnosis not present

## 2023-04-17 LAB — GLUCOSE, CAPILLARY: Glucose-Capillary: 178 mg/dL — ABNORMAL HIGH (ref 70–99)

## 2023-04-17 NOTE — Progress Notes (Signed)
[name], Jobe [name] ([id number]) [id number] Page 1 of 2 Visit Report for 04/17/2023 HBO Details Patient Name: Date of Service: [name] 04/17/2023 8:30 A M Medical Record Number: [id number] Patient Account Number: [id number] Date of Birth/Sex: Treating RN: 08-29-1951 (72 y.o. [name], [name] Primary Care Geriann Lafont: [name] Other Clinician: [name] Referring Haillie Radu: Treating Siyah Mault/Extender: [name] in Treatment: 6 HBO Treatment Course Details Treatment Course Number: 1 Ordering Heavin Sebree: [name] T Treatments Ordered: otal 40 HBO Treatment Start Date: 04/06/2023 HBO Indication: Diabetic Ulcer(s) of the Lower Extremity Standard/Conservative Wound Care tried and failed greater than or equal to 30 days Wound #8 Right T Great oe HBO Treatment Details Treatment Number: 9 Patient Type: Outpatient Chamber Type: Monoplace Chamber Serial #: [id number] Treatment Protocol: 2.5 ATA with 90 minutes oxygen, with two 5 minute air breaks Treatment Details Compression Rate Down: 1.0 psi / minute De-Compression Rate Up: 1.5 psi / minute A breaks and breathing ir Compress Tx Pressure periods Decompress Decompress Begins Reached (leave unused spaces Begins Ends blank) Chamber Pressure (ATA 1 2.5 2.5 2.5 2.5 2.5 - - 2.5 1 ) Clock Time (24 hr) 08:29 08:52 09:22 09:27 09:57 10:02 - - 10:32 10:47 Treatment Length: 138 (minutes) Treatment Segments: 5 Vital Signs Capillary Blood Glucose Reference Range: 80 - 120 mg / dl HBO Diabetic Blood Glucose Intervention Range: <131 mg/dl or >324 mg/dl Time Vitals Blood Respiratory Capillary Blood Glucose Pulse Action Type: Pulse: Temperature: Taken: Pressure: Rate: Glucose (mg/dl): Meter #: Oximetry (%) Taken: Pre 08:11 122/79 78 18 97.2 178 Post 10:45 170/89 75 18 97.5 202 Treatment Response Treatment Toleration: Fair Treatment Completion Status: Treatment Completed  without Adverse Event Treatment Notes Per orders the patient was given 8 oz of Glucerna to drink before treatment. The patient compression rate was started at 2 PSI/min. at 8 PSI, the patient was having a problem clearing his left ear. The pressure wsa droped back to 1 PSI, until he was able to clear his left ear. Compression rate was set a 1 PSI/min until 2.5 ATA. Decompression rate was set at 1 PSI/min with no problems reported that he was having a problem clear his ears. Dr. Lady Gary was asked to see the patient at chamber side to check his ears. Dr. Lady Gary stated that his left war was full of wax and informed the patient that he needed to clean his ears. The patient was discharged He stated the he was not having any problem's with ears at that time. Additional Procedure Documentation Tissue Sevierity: Necrosis of muscle Physician HBO Attestation: I certify that I supervised this HBO treatment in accordance with Medicare guidelines. A trained emergency response team is readily available per Yes hospital policies and procedures. Continue HBOT as ordered. Chandlor Noecker, Jobe [name] ([id number]) [id number] Page 2 of 2 Electronic Signature(s) Signed: 04/20/2023 9:35:21 AM By: [name] MD FACS Previous Signature: 04/17/2023 1:49:10 PM Version By: [name] EMT Entered By: [name] on 04/20/2023 09:35:21 -------------------------------------------------------------------------------- HBO Safety Checklist Details Patient Name: Date of Service: [name] RD, [name] 04/17/2023 8:30 A M Medical Record Number: [id number] Patient Account Number: [id number] Date of Birth/Sex: Treating RN: November 01, 1950 (72 y.o. [name] Primary Care Darianne Muralles: [name] Other Clinician: [name] Referring Lyliana Dicenso: Treating Kerrion Kemppainen/Extender: [name]: 6 HBO Safety Checklist Items Safety Checklist Consent Form  Signed Patient voided / foley secured and emptied When did you last eato last night Last dose of injectable or oral agent Last night  Ostomy pouch emptied and vented if applicable NA All implantable devices assessed, documented and approved NA Intravenous access site secured and place NA Valuables secured Linens and cotton and cotton/polyester blend (less than 51% polyester) Personal oil-based products / skin lotions / body lotions removed Wigs or hairpieces removed NA Smoking or tobacco materials removed Books / newspapers / magazines / loose paper removed Cologne, aftershave, perfume and deodorant removed Jewelry removed (may wrap wedding band) Make-up removed NA Hair care products removed Battery operated devices (external) removed Heating patches and chemical warmers removed Titanium eyewear removed NA Nail polish cured greater than 10 hours NA Casting material cured greater than 10 hours NA Hearing aids removed NA Loose dentures or partials removed removed by patient Prosthetics have been removed NA Patient demonstrates correct use of air break device (if applicable) Patient concerns have been addressed Patient grounding bracelet on and cord attached to chamber Specifics for Inpatients (complete in addition to above) Medication sheet sent with patient NA Intravenous medications needed or due during therapy sent with patient NA Drainage tubes (e.g. nasogastric tube or chest tube secured and vented) NA Endotracheal or Tracheotomy tube secured NA Cuff deflated of air and inflated with saline NA Airway suctioned NA Notes The safety checklist was sone before the treatment was started. Electronic Signature(s) Signed: 04/17/2023 1:41:47 PM By: [name] EMT Entered By: [name] on 04/17/2023 13:41:47

## 2023-04-17 NOTE — Progress Notes (Signed)
ARLISS, FRISINA ([id number]) [id number] Page 1 of 8 Visit Report for 04/17/2023 Arrival Information Details Patient Name: Date of Service: [name] 04/17/2023 7:30 [name] M Medical Record Number: [id number] Patient Account Number: [id number] Date of Birth/Sex: Treating RN: [birth year] (72 y.o. [name] Primary Care [name]: [name] Other Clinician: Referring [name]: Treating [name]/Extender: [name] in Treatment: 6 Visit Information History Since Last Visit All ordered tests and consults were completed: Yes Patient Arrived: Ambulatory Added or deleted any medications: No Arrival Time: 07:35 Any new allergies or adverse reactions: No Accompanied By: self Had [name] fall or experienced change in No Transfer Assistance: None activities of daily living that may affect Patient Identification Verified: Yes risk of falls: Secondary Verification Process Completed: Yes Signs or symptoms of abuse/neglect since last visito No Patient Requires Transmission-Based Precautions: No Hospitalized since last visit: No Patient Has Alerts: Yes Implantable device outside of the clinic excluding No Patient Alerts: Patient on Blood Thinner cellular tissue based products placed in the center Plavix since last visit: ABI R [place] Has Dressing in Place as Prescribed: Yes Pain Present Now: No Electronic Signature(s) Signed: 04/17/2023 10:19:19 AM By: [name] Entered By: [name] on 04/17/2023 07:36:16 -------------------------------------------------------------------------------- Encounter Discharge Information Details Patient Name: Date of Service: [name], [name] 04/17/2023 7:30 [name] M Medical Record Number: [id number] Patient Account Number: [id number] Date of Birth/Sex: Treating RN: 1951/08/30 (72 y.o. [name] Primary Care [name]: [name] Other Clinician: Referring [name]: Treating  [name]/Extender: [name] in Treatment: 6 Encounter Discharge Information Items Post Procedure Vitals Discharge Condition: Stable Temperature (F): 97.9 Ambulatory Status: Ambulatory Pulse (bpm): 77 Discharge Destination: Home Respiratory Rate (breaths/min): 18 Transportation: Private Auto Blood Pressure (mmHg): 202/77 Accompanied By: self Schedule Follow-up Appointment: Yes Clinical Summary of Care: Patient Declined Electronic Signature(s) Signed: 04/17/2023 10:19:19 AM By: [name] Entered By: [name] on 04/17/2023 08:04:20 [name] ([id number]) [id number] Page 2 of 8 -------------------------------------------------------------------------------- Lower Extremity Assessment Details Patient Name: Date of Service: [name] 04/17/2023 7:30 [name] M Medical Record Number: [id number] Patient Account Number: [id number] Date of Birth/Sex: Treating RN: 1951-08-01 (72 y.o. [name] Primary Care Nohlan Burdin: [name] Other Clinician: Referring Margrett Kalb: Treating [name]/Extender: [name] Weeks in Treatment: 6 Edema Assessment Assessed: [name]: No] [Right: No] Edema: [Left: N] [Right: o] Calf Left: Right: Point of Measurement: 33 cm From Medial Instep 35 cm Ankle Left: Right: Point of Measurement: 10 cm From Medial Instep 22 cm Vascular Assessment Pulses: Dorsalis Pedis Palpable: [Right:Yes] Electronic Signature(s) Signed: 04/17/2023 10:19:19 AM By: [name] Entered By: [name] on 04/17/2023 07:41:10 -------------------------------------------------------------------------------- Multi Wound Chart Details Patient Name: Date of Service: [name], [name] 04/17/2023 7:30 [name] M Medical Record Number: [id number] Patient Account Number: [id number] Date of Birth/Sex: Treating RN: 1951-01-07 (72 y.o. M) Primary Care [name]: [name] Other  Clinician: Referring [name]: Treating [name]/Extender: [name] Weeks in Treatment: 6 Vital Signs Height(in): 67 Capillary Blood Glucose(mg/dl): 932 Weight(lbs): 355 Pulse(bpm): 77 Body Mass Index(BMI): 24.1 Blood Pressure(mmHg): 202/96 Temperature(F): 97.9 Respiratory Rate(breaths/min): 18 [8:Photos:] [N/[name]:N/[name]] Right T Great oe N/[name] N/[name] Wound Location: Gradually Appeared N/[name] N/[name] Wounding Event: Diabetic Wound/Ulcer of the Lower N/[name] N/[name] Primary Etiology: Extremity Hypertension, Type II Diabetes, N/[name] N/[name] Comorbid History: Osteoarthritis, Neuropathy, Received Radiation 02/04/2023 N/[name] N/[name] Date Acquired: 6 N/[name] N/[name] Weeks of Treatment: Open N/[name] N/[name] Wound Status: No N/[name] N/[name] Wound Recurrence: 1x2.3x0.1 N/[name] N/[name] Measurements L x W  x D (cm) 1.806 N/[name] N/[name] [name] (cm) : rea 0.181 N/[name] N/[name] Volume (cm) : 84.40% N/[name] N/[name] % Reduction in [name] rea: 92.20% N/[name] N/[name] % Reduction in Volume: Grade 3 N/[name] N/[name] Classification: Medium N/[name] N/[name] Exudate [name] mount: Serosanguineous N/[name] N/[name] Exudate Type: red, brown N/[name] N/[name] Exudate Color: Distinct, outline attached N/[name] N/[name] Wound Margin: Large (67-100%) N/[name] N/[name] Granulation [name] mount: Red, Hyper-granulation N/[name] N/[name] Granulation Quality: Small (1-33%) N/[name] N/[name] Necrotic [name] mount: Fat Layer (Subcutaneous Tissue): Yes N/[name] N/[name] Exposed Structures: Bone: Yes Fascia: No Tendon: No Muscle: No Joint: No Small (1-33%) N/[name] N/[name] Epithelialization: Callus: No N/[name] N/[name] Periwound Skin Texture: Maceration: No N/[name] N/[name] Periwound Skin Moisture: Dry/Scaly: No No Abnormalities Noted N/[name] N/[name] Periwound Skin Color: No Abnormality N/[name] N/[name] Temperature: Cellular or Tissue Based Product N/[name] N/[name] Procedures Performed: Treatment Notes Wound #8 (Toe Great) Wound Laterality: Right Cleanser Normal Saline Discharge Instruction: Cleanse the wound with Normal Saline prior to applying [name] clean dressing using gauze sponges, not tissue or cotton  balls. Soap and Water Discharge Instruction: May shower and wash wound with dial antibacterial soap and water prior to dressing change. Peri-Wound Care Topical Primary Dressing Epicord Secondary Dressing ADAPTIC TOUCH 3x4.25 in Discharge Instruction: Apply over primary dressing as directed. Woven Gauze Sponges 2x2 in Discharge Instruction: Apply over primary dressing as directed. Secured With Conforming Stretch Gauze Bandage, Sterile 2x75 (in/in) Discharge Instruction: Secure with stretch gauze as directed. 63M Medipore H Soft Cloth Surgical T ape, 4 x 10 (in/yd) Discharge Instruction: Secure with tape as directed. Compression Wrap Compression Stockings Add-Ons Electronic Signature(s) Signed: 04/17/2023 8:26:16 AM By: [name] MD FACS Entered By: [name] on 04/17/2023 08:26:16 [place], [name] ([id number]) [id number] Page 4 of 8 -------------------------------------------------------------------------------- Multi-Disciplinary Care Plan Details Patient Name: Date of Service: [name] 04/17/2023 7:30 [name] M Medical Record Number: [id number] Patient Account Number: [id number] Date of Birth/Sex: Treating RN: 22-Jun-1951 (71 y.o. [name] Primary Care Jennalee Greaves: [name] Other Clinician: Referring Zinia Innocent: Treating Joleah Kosak/Extender: [name] in Treatment: 6 Multidisciplinary Care Plan reviewed with physician Active Inactive HBO Nursing Diagnoses: Anxiety related to knowledge deficit of hyperbaric oxygen therapy and treatment procedures Goals: Patient will tolerate the hyperbaric oxygen therapy treatment Date Initiated: 03/02/2023 Target Resolution Date: 05/27/2023 Goal Status: Active Interventions: Assess for signs and symptoms related to adverse events, including but not limited to confinement anxiety, pneumothorax, oxygen toxicity and baurotrauma Notes: Nutrition Nursing  Diagnoses: Imbalanced nutrition Impaired glucose control: actual or potential Potential for alteratiion in Nutrition/Potential for imbalanced nutrition Goals: Patient/caregiver will maintain therapeutic glucose control Date Initiated: 03/17/2023 Target Resolution Date: 04/14/2023 Goal Status: Active Interventions: Assess HgA1c results as ordered upon admission and as needed Assess patient nutrition upon admission and as needed per policy Treatment Activities: Patient referred to Primary Care Physician for further nutritional evaluation : 03/17/2023 Notes: Wound/Skin Impairment Nursing Diagnoses: Impaired tissue integrity Knowledge deficit related to ulceration/compromised skin integrity Goals: Patient/caregiver will verbalize understanding of skin care regimen Date Initiated: 03/17/2023 Target Resolution Date: 04/28/2023 Goal Status: Active Ulcer/skin breakdown will have [name] volume reduction of 30% by week 4 Date Initiated: 03/17/2023 Date Inactivated: 04/02/2023 Target Resolution Date: 03/31/2023 Goal Status: Met Ulcer/skin breakdown will have [name] volume reduction of 50% by week 8 Date Initiated: 04/02/2023 Target Resolution Date: 04/30/2023 Goal Status: Active Interventions: Assess patient/caregiver ability to obtain necessary supplies [place], [name] ([id number]) [id number] Page 5 of 8 Assess patient/caregiver ability to perform ulcer/skin care regimen upon admission and as needed  Assess ulceration(s) every visit Treatment Activities: Skin care regimen initiated : 03/17/2023 Topical wound management initiated : 03/17/2023 Notes: Electronic Signature(s) Signed: 04/17/2023 10:19:19 AM By: [name] Entered By: [name] on 04/17/2023 07:49:47 -------------------------------------------------------------------------------- Pain Assessment Details Patient Name: Date of Service: [name], [name] 04/17/2023 7:30 [name] M Medical Record Number:  [id number] Patient Account Number: [id number] Date of Birth/Sex: Treating RN: 1951-03-20 (72 y.o. [name] Primary Care Javien Tesch: [name] Other Clinician: Referring Rawlin Reaume: Treating Rachelann Enloe/Extender: [name] Weeks in Treatment: 6 Active Problems Location of Pain Severity and Description of Pain Patient Has Paino No Site Locations Pain Management and Medication Current Pain Management: Electronic Signature(s) Signed: 04/17/2023 10:19:19 AM By: [name] Entered By: [name] on 04/17/2023 07:37:20 -------------------------------------------------------------------------------- Patient/Caregiver Education Details Patient Name: Date of Service: [name], [name] 6/21/2024andnbsp7:30 [name] M Medical Record Number: [id number] Patient Account Number: [id number] Date of Birth/Gender: Treating RN: 06-06-1951 (72 y.o. Taitum, Alms, Rickey ([id number]) [phone].pdf Page 6 of 8 Primary Care Physician: [name] Other Clinician: Referring Physician: Treating Physician/Extender: [name] in Treatment: 6 Education Assessment Education Provided To: Patient Education Topics Provided Wound/Skin Impairment: Methods: Explain/Verbal Responses: State content correctly Electronic Signature(s) Signed: 04/17/2023 10:19:19 AM By: [name] Entered By: [name] on 04/17/2023 07:50:03 -------------------------------------------------------------------------------- Wound Assessment Details Patient Name: Date of Service: [name], [name] 04/17/2023 7:30 [name] M Medical Record Number: [id number] Patient Account Number: [id number] Date of Birth/Sex: Treating RN: 06-27-1951 (72 y.o. [name] Primary Care Breella Vanostrand: [name] Other Clinician: Referring Kanai Hilger: Treating Shawnika Pepin/Extender: [name] Weeks in Treatment: 6 Wound Status Wound  Number: 8 Primary Diabetic Wound/Ulcer of the Lower Extremity Etiology: Wound Location: Right T Great oe Wound Open Wounding Event: Gradually Appeared Status: Date Acquired: 02/04/2023 Comorbid Hypertension, Type II Diabetes, Osteoarthritis, Neuropathy, Weeks Of Treatment: 6 History: Received Radiation Clustered Wound: No Photos Wound Measurements Length: (cm) 1 Width: (cm) 2.3 Depth: (cm) 0.1 Area: (cm) 1.806 Volume: (cm) 0.181 % Reduction in Area: 84.4% % Reduction in Volume: 92.2% Epithelialization: Small (1-33%) Tunneling: No Undermining: No Wound Description Classification: Grade 3 Wound Margin: Distinct, outline attached Exudate Amount: Medium Exudate Type: Serosanguineous Exudate Color: red, brown Skorupski, [name] ([id number]) Wound Bed Granulation Amount: Large (67-100%) Granulation Quality: Red, Hyper-granulation Necrotic Amount: Small (1-33%) Necrotic Quality: Adherent Slough Foul Odor After Cleansing: No Slough/Fibrino Yes [phone].pdf Page 7 of 8 Exposed Structure Fascia Exposed: No Fat Layer (Subcutaneous Tissue) Exposed: Yes Tendon Exposed: No Muscle Exposed: No Joint Exposed: No Bone Exposed: Yes Periwound Skin Texture Texture Color No Abnormalities Noted: Yes No Abnormalities Noted: Yes Moisture Temperature / Pain No Abnormalities Noted: No Temperature: No Abnormality Dry / Scaly: No Maceration: No Treatment Notes Wound #8 (Toe Great) Wound Laterality: Right Cleanser Normal Saline Discharge Instruction: Cleanse the wound with Normal Saline prior to applying [name] clean dressing using gauze sponges, not tissue or cotton balls. Soap and Water Discharge Instruction: May shower and wash wound with dial antibacterial soap and water prior to dressing change. Peri-Wound Care Topical Primary Dressing Epicord Secondary Dressing ADAPTIC TOUCH 3x4.25 in Discharge Instruction: Apply over primary dressing as directed. Woven  Gauze Sponges 2x2 in Discharge Instruction: Apply over primary dressing as directed. Secured With Conforming Stretch Gauze Bandage, Sterile 2x75 (in/in) Discharge Instruction: Secure with stretch gauze as directed. 67M Medipore H Soft Cloth Surgical T ape, 4 x 10 (in/yd) Discharge Instruction: Secure with tape as directed. Compression Wrap Compression Stockings Add-Ons Electronic Signature(s) Signed:  04/17/2023 10:19:19 AM By: [name] Entered By: [name] on 04/17/2023 07:44:43 -------------------------------------------------------------------------------- Vitals Details Patient Name: Date of Service: [name], [name] 04/17/2023 7:30 [name] M Medical Record Number: [id number] Patient Account Number: [id number] Date of Birth/Sex: Treating RN: [birth year] (72 y.o. [name] Primary Care Susana Duell: [name] Other Clinician: Referring Rachella Basden: Treating Ski Polich/Extender: [name] Weeks in Treatment: 6 Vital Signs [name], [name] ([id number]) [id number] Page 8 of 8 Time Taken: 07:35 Temperature (F): 97.9 Height (in): 67 Pulse (bpm): 77 Weight (lbs): 154 Respiratory Rate (breaths/min): 18 Body Mass Index (BMI): 24.1 Blood Pressure (mmHg): 202/96 Capillary Blood Glucose (mg/dl): 782 Reference Range: 80 - 120 mg / dl Electronic Signature(s) Signed: 04/17/2023 10:19:19 AM By: [name] Entered By: [name] on 04/17/2023 07:37:14

## 2023-04-17 NOTE — Progress Notes (Signed)
CLESTER, CHLEBOWSKI ([id number]) [id number] Page 1 of 2 Visit Report for 04/17/2023 Arrival Information Details Patient Name: Date of Service: [name] 04/17/2023 8:30 [name] M Medical Record Number: [id number] Patient Account Number: [id number] Date of Birth/Sex: Treating RN: 07/18/1951 (72 y.o. [name], [name] Primary Care [name]: [name] Other Clinician: Karl [name] Referring [name]: Treating [name]/Extender: [name] in Treatment: 6 Visit Information History Since Last Visit All ordered tests and consults were completed: Yes Patient Arrived: Ambulatory Added or deleted any medications: No Arrival Time: 07:28 Any new allergies or adverse reactions: No Accompanied By: none Had [name] fall or experienced change in No Transfer Assistance: None activities of daily living that may affect Patient Identification Verified: Yes risk of falls: Secondary Verification Process Completed: Yes Signs or symptoms of abuse/neglect since last visito No Patient Requires Transmission-Based Precautions: No Hospitalized since last visit: No Patient Has Alerts: Yes Implantable device outside of the clinic excluding No Patient Alerts: Patient on Blood Thinner cellular tissue based products placed in the center Plavix since last visit: ABI R [name] Pain Present Now: No Electronic Signature(s) Signed: 04/17/2023 1:36:56 PM By: [name] EMT Entered By: [name] on 04/17/2023 13:36:55 -------------------------------------------------------------------------------- Encounter Discharge Information Details Patient Name: Date of Service: [name], [name] 04/17/2023 8:30 [name] M Medical Record Number: [id number] Patient Account Number: [id number] Date of Birth/Sex: Treating RN: [birth year] (72 y.o. [name], [name] Primary Care [name]: [name] Other Clinician: Karl [name] Referring [name]: Treating [name]/Extender:  [name] in Treatment: 6 Encounter Discharge Information Items Discharge Condition: Stable Ambulatory Status: Ambulatory Discharge Destination: Home Transportation: Private Auto Accompanied By: none Schedule Follow-up Appointment: Yes Clinical Summary of Care: Electronic Signature(s) Signed: 04/17/2023 1:50:29 PM By: [name] EMT Entered By: [name] on 04/17/2023 13:50:29 [name], [name] ([id number]) [id number] Page 2 of 2 -------------------------------------------------------------------------------- Vitals Details Patient Name: Date of Service: [name] 04/17/2023 8:30 [name] M Medical Record Number: [id number] Patient Account Number: [id number] Date of Birth/Sex: Treating RN: 02-Oct-1951 (72 y.o. [name], [name] Primary Care [name]: [name] Other Clinician: Karl [name] Referring [name]: Treating [name]/Extender: [name] Weeks in Treatment: 6 Vital Signs Time Taken: 08:11 Temperature (F): 97.2 Height (in): 67 Pulse (bpm): 78 Weight (lbs): 154 Respiratory Rate (breaths/min): 18 Body Mass Index (BMI): 24.1 Blood Pressure (mmHg): 122/79 Capillary Blood Glucose (mg/dl): 220 Reference Range: 80 - 120 mg / dl Electronic Signature(s) Signed: 04/17/2023 1:40:26 PM By: [name] EMT Entered By: [name] on 04/17/2023 13:40:25

## 2023-04-20 ENCOUNTER — Encounter (HOSPITAL_BASED_OUTPATIENT_CLINIC_OR_DEPARTMENT_OTHER): Payer: Medicare HMO | Admitting: General Surgery

## 2023-04-20 LAB — GLUCOSE, CAPILLARY: Glucose-Capillary: 202 mg/dL — ABNORMAL HIGH (ref 70–99)

## 2023-04-20 NOTE — Progress Notes (Signed)
MACIO, KISSOON ([id number]) [id number] Page 1 of 2 Visit Report for 04/17/2023 Problem List Details Patient Name: Date of Service: [name] 04/17/2023 8:30 A M Medical Record Number: [id number] Patient Account Number: [id number] Date of Birth/Sex: Treating RN: 01/02/1951 (72 y.o. 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 No Yes necrosis of bone M86.171 Other acute osteomyelitis, right ankle and foot 03/02/2023 No Yes E11.621 Type 2 diabetes mellitus with foot ulcer 03/02/2023 No Yes I10 Essential (primary) hypertension 03/02/2023 No Yes N18.32 Chronic kidney disease, stage 3b 03/02/2023 No Yes Inactive Problems Resolved Problems ICD-10 Code Description Active Date Resolved Date L97.512 Non-pressure chronic ulcer of other part of right foot with fat layer 03/09/2023 03/09/2023 exposed Electronic Signature(s) Signed: 04/17/2023 1:49:46 PM By: [name] EMT Signed: 04/20/2023 9:35:48 AM By: [name] MD FACS Entered By: [name] on 04/17/2023 13:49:45 -------------------------------------------------------------------------------- SuperBill Details Patient Name: Date of Service: [name], [name] 04/17/2023 Medical Record Number: [id number] Patient Account Number: [id number] Date of Birth/Sex: Treating RN: 1951/07/28 (72 y.o. [name], [name] ([id number]) [phone].pdf Page 2 of 2 Primary Care Provider: [name] Other Clinician: [name] Referring Provider: Treating Provider/Extender: [name] in Treatment: 6 Diagnosis Coding ICD-10 Codes Code  Description [phone] Non-pressure chronic ulcer of other part of right foot with necrosis of bone M86.171 Other acute osteomyelitis, right ankle and foot E11.621 Type 2 diabetes mellitus with foot ulcer I10 Essential (primary) hypertension N18.32 Chronic kidney disease, stage 3b Facility Procedures : CPT4 Code Description: [id number]-(Facility Use Only) HBOT full body chamber, , ICD-10 Diagnosis Description M86.171 Other acute osteomyelitis, right ankle and foot L97.514 Non-pressure chronic ulcer of other part of right foot wi E11.621 Type  2 diabetes mellitus with foot ulcer Modifier: th necrosis o Quantity: 5 f bone Physician Procedures : CPT4 Code Description Modifier [id number] - WC PHYS HYPERBARIC OXYGEN THERAPY ICD-10 Diagnosis Description M86.171 Other acute osteomyelitis, right ankle and foot L97.514 Non-pressure chronic ulcer of other part of right foot with necrosis o E11.621  Type 2 diabetes mellitus with foot ulcer Quantity: 1 f bone Electronic Signature(s) Signed: 04/17/2023 1:49:38 PM By: [name] EMT Signed: 04/20/2023 9:35:48 AM By: [name] MD FACS Entered By: [name] on 04/17/2023 13:49:37

## 2023-04-21 ENCOUNTER — Encounter (HOSPITAL_BASED_OUTPATIENT_CLINIC_OR_DEPARTMENT_OTHER): Payer: Medicare HMO | Admitting: General Surgery

## 2023-04-22 ENCOUNTER — Encounter (HOSPITAL_BASED_OUTPATIENT_CLINIC_OR_DEPARTMENT_OTHER): Payer: Medicare HMO | Admitting: General Surgery

## 2023-04-23 ENCOUNTER — Encounter (HOSPITAL_BASED_OUTPATIENT_CLINIC_OR_DEPARTMENT_OTHER): Payer: Medicare HMO | Admitting: General Surgery

## 2023-04-23 DIAGNOSIS — E1122 Type 2 diabetes mellitus with diabetic chronic kidney disease: Secondary | ICD-10-CM | POA: Diagnosis not present

## 2023-04-23 DIAGNOSIS — M86171 Other acute osteomyelitis, right ankle and foot: Secondary | ICD-10-CM | POA: Diagnosis not present

## 2023-04-23 DIAGNOSIS — N1832 Chronic kidney disease, stage 3b: Secondary | ICD-10-CM | POA: Diagnosis not present

## 2023-04-23 DIAGNOSIS — I129 Hypertensive chronic kidney disease with stage 1 through stage 4 chronic kidney disease, or unspecified chronic kidney disease: Secondary | ICD-10-CM | POA: Diagnosis not present

## 2023-04-23 DIAGNOSIS — L97514 Non-pressure chronic ulcer of other part of right foot with necrosis of bone: Secondary | ICD-10-CM | POA: Diagnosis not present

## 2023-04-23 DIAGNOSIS — E11621 Type 2 diabetes mellitus with foot ulcer: Secondary | ICD-10-CM | POA: Diagnosis not present

## 2023-04-23 LAB — GLUCOSE, CAPILLARY
Glucose-Capillary: 212 mg/dL — ABNORMAL HIGH (ref 70–99)
Glucose-Capillary: 228 mg/dL — ABNORMAL HIGH (ref 70–99)

## 2023-04-23 NOTE — Progress Notes (Addendum)
LYNAM, [name] ([id number]) [id number] Page 1 of 2 Visit Report for 04/23/2023 Arrival Information Details Patient Name: Date of Service: [name] 04/23/2023 9:15 A M Medical Record Number: [id number] Patient Account Number: [id number] Date of Birth/Sex: Treating RN: 07-01-1951 (72 y.o. [name] Primary Care Krislynn Gronau: [name] Other Clinician: Haywood Pao Referring Rishik Tubby: Treating Callen Vancuren/Extender: [name] in Treatment: 7 Visit Information History Since Last Visit All ordered tests and consults were completed: Yes Patient Arrived: Ambulatory Added or deleted any medications: No Arrival Time: 08:16 Any new allergies or adverse reactions: No Accompanied By: self Had a fall or experienced change in No Transfer Assistance: None activities of daily living that may affect Patient Identification Verified: Yes risk of falls: Secondary Verification Process Completed: Yes Signs or symptoms of abuse/neglect since last visito No Patient Requires Transmission-Based Precautions: No Hospitalized since last visit: No Patient Has Alerts: Yes Implantable device outside of the clinic excluding No Patient Alerts: Patient on Blood Thinner cellular tissue based products placed in the center Plavix since last visit: ABI R [place] Pain Present Now: No Electronic Signature(s) Signed: 04/23/2023 1:52:54 PM By: Haywood Pao CHT EMT BS , , Entered By: Haywood Pao on 04/23/2023 13:52:54 -------------------------------------------------------------------------------- Encounter Discharge Information Details Patient Name: Date of Service: [name] 04/23/2023 9:15 A M Medical Record Number: [id number] Patient Account Number: [id number] Date of Birth/Sex: Treating RN: [birth year] (72 y.o. [name] Primary Care Page Lancon: [name] Other Clinician: Haywood Pao Referring  Tniya Bowditch: Treating Sayla Golonka/Extender: [name] in Treatment: 7 Encounter Discharge Information Items Discharge Condition: Stable Ambulatory Status: Ambulatory Discharge Destination: Home Transportation: Private Auto Accompanied By: self Schedule Follow-up Appointment: No Clinical Summary of Care: Electronic Signature(s) Signed: 04/23/2023 3:02:55 PM By: Haywood Pao CHT EMT BS , , Entered By: Haywood Pao on 04/23/2023 15:02:54 [name] ([id number]) [id number] Page 2 of 2 -------------------------------------------------------------------------------- Vitals Details Patient Name: Date of Service: [name] 04/23/2023 9:15 A M Medical Record Number: [id number] Patient Account Number: [id number] Date of Birth/Sex: Treating RN: [birth year] (72 y.o. [name] Primary Care Meyer Arora: [name] Other Clinician: Haywood Pao Referring Avyon Herendeen: Treating Mataio Mele/Extender: [name] in Treatment: 7 Vital Signs Time Taken: 09:03 Temperature (F): 97.6 Height (in): 67 Pulse (bpm): 72 Weight (lbs): 154 Respiratory Rate (breaths/min): 18 Body Mass Index (BMI): 24.1 Blood Pressure (mmHg): 162/77 Capillary Blood Glucose (mg/dl): 220 Reference Range: 80 - 120 mg / dl Electronic Signature(s) Signed: 04/23/2023 1:53:17 PM By: Haywood Pao CHT EMT BS , , Entered By: Haywood Pao on 04/23/2023 13:53:17

## 2023-04-23 NOTE — Progress Notes (Signed)
HYPPOLITE, [name] ([id number]) [id number] Page 1 of 6 Visit Report for 04/23/2023 Arrival Information Details Patient Name: Date of Service: [name] 04/23/2023 8:15 A M Medical Record Number: [id number] Patient Account Number: [id number] Date of Birth/Sex: Treating RN: 1951/05/06 (72 y.o. [name] Primary Care [name]: [name] Other Clinician: Referring [name]: Treating [name]: [name] in Treatment: 7 Visit Information History Since Last Visit Added or deleted any medications: No Patient Arrived: Ambulatory Any new allergies or adverse reactions: No Arrival Time: 08:19 Had a fall or experienced change in No Accompanied By: self activities of daily living that may affect Transfer Assistance: None risk of falls: Patient Identification Verified: Yes Signs or symptoms of abuse/neglect since last visito No Secondary Verification Process Completed: Yes Hospitalized since last visit: No Patient Requires Transmission-Based Precautions: No Implantable device outside of the clinic excluding No Patient Has Alerts: Yes cellular tissue based products placed in the center Patient Alerts: Patient on Blood Thinner since last visit: Plavix Has Dressing in Place as Prescribed: Yes ABI R [place] Pain Present Now: No Electronic Signature(s) Signed: 04/23/2023 4:07:14 PM By: Redmond Pulling RN, BSN Entered By: Redmond Pulling on 04/23/2023 08:22:39 -------------------------------------------------------------------------------- Lower Extremity Assessment Details Patient Name: Date of Service: [name] RD, [name] 04/23/2023 8:15 A M Medical Record Number: [id number] Patient Account Number: [id number] Date of Birth/Sex: Treating RN: 21-Jun-1951 (72 y.o. [name] Primary Care [name]: [name] Other Clinician: Referring Ladye Macnaughton: Treating Keira [name]/Extender: [name] Weeks in  Treatment: 7 Edema Assessment Assessed: [Left: No] [Right: No] Edema: [Left: N] [Right: o] Calf Left: Right: Point of Measurement: 33 cm From Medial Instep 34.5 cm Ankle Left: Right: Point of Measurement: 10 cm From Medial Instep 21.7 cm Vascular Assessment Pulses: Dorsalis Pedis Palpable: [Right:Yes [id number] Page 2 of 6] Electronic Signature(s) Signed: 04/23/2023 4:07:14 PM By: Redmond Pulling RN, BSN Entered By: Redmond Pulling on 04/23/2023 08:24:23 -------------------------------------------------------------------------------- Multi Wound Chart Details Patient Name: Date of Service: [name] RD, [name] 04/23/2023 8:15 A M Medical Record Number: [id number] Patient Account Number: [id number] Date of Birth/Sex: Treating RN: [birth year] (72 y.o. M) Primary Care Daneesha Quinteros: [name] Other Clinician: Referring [name]: Treating [name]/Extender: [name] Weeks in Treatment: 7 Vital Signs Height(in): 67 Pulse(bpm): 80 Weight(lbs): 154 Blood Pressure(mmHg): 198/85 Body Mass Index(BMI): 24.1 Temperature(F): 97.9 Respiratory Rate(breaths/min): 18 [8:Photos:] [N/A:N/A] Right T Great oe N/A N/A Wound Location: Gradually Appeared N/A N/A Wounding Event: Diabetic Wound/Ulcer of the Lower N/A N/A Primary Etiology: Extremity Hypertension, Type II Diabetes, N/A N/A Comorbid History: Osteoarthritis, Neuropathy, Received Radiation 02/04/2023 N/A N/A Date Acquired: 7 N/A N/A Weeks of Treatment: Open N/A N/A Wound Status: No N/A N/A Wound Recurrence: 1.4x2.4x0.1 N/A N/A Measurements L x W x D (cm) 2.639 N/A N/A A (cm) : rea 0.264 N/A N/A Volume (cm) : 77.10% N/A N/A % Reduction in A rea: 88.60% N/A N/A % Reduction in Volume: Grade 3 N/A N/A Classification: Medium N/A N/A Exudate A mount: Serosanguineous N/A N/A Exudate Type: red, brown N/A N/A Exudate Color: Distinct, outline attached N/A N/A Wound  Margin: Large (67-100%) N/A N/A Granulation A mount: Red, Hyper-granulation N/A N/A Granulation Quality: Small (1-33%) N/A N/A Necrotic A mount: Fat Layer (Subcutaneous Tissue): Yes N/A N/A Exposed Structures: Bone: Yes Fascia: No Tendon: No Muscle: No Joint: No Small (1-33%) N/A N/A Epithelialization: Debridement - Selective/Open Wound N/A N/A Debridement: Pre-procedure Verification/Time Out 08:40 N/A N/A Taken: Lidocaine 4% Topical Solution N/A N/A Pain Control: Slough N/A N/A  Tissue Debrided: Non-Viable Tissue N/A N/A Level: 2.64 N/A N/A Debridement A (sq cm): rea Curette N/A N/A Instrument: Minimum N/A N/A Bleeding: [name] ([id number]) [id number] Page 3 of 6 Pressure N/A N/A Hemostasis Achieved: Procedure was tolerated well N/A N/A Debridement Treatment Response: 1.4x2.4x0.1 N/A N/A Post Debridement Measurements L x W x D (cm) 0.264 N/A N/A Post Debridement Volume: (cm) Callus: No N/A N/A Periwound Skin Texture: Maceration: No N/A N/A Periwound Skin Moisture: Dry/Scaly: No No Abnormalities Noted N/A N/A Periwound Skin Color: No Abnormality N/A N/A Temperature: Cellular or Tissue Based Product N/A N/A Procedures Performed: Debridement Treatment Notes Electronic Signature(s) Signed: 04/23/2023 9:12:19 AM By: [name] MD FACS Entered By: [name] on 04/23/2023 09:12:19 -------------------------------------------------------------------------------- Multi-Disciplinary Care Plan Details Patient Name: Date of Service: [name] RD, [name] 04/23/2023 8:15 A M Medical Record Number: [id number] Patient Account Number: [id number] Date of Birth/Sex: Treating RN: 10/26/1951 (72 y.o. [name] Primary Care [name]: [name] Other Clinician: Referring [name]: Treating [name]/Extender: [name] in Treatment: 7 Multidisciplinary Care Plan reviewed with  physician Active Inactive HBO Nursing Diagnoses: Anxiety related to knowledge deficit of hyperbaric oxygen therapy and treatment procedures Goals: Patient will tolerate the hyperbaric oxygen therapy treatment Date Initiated: 03/02/2023 Target Resolution Date: 05/27/2023 Goal Status: Active Interventions: Assess for signs and symptoms related to adverse events, including but not limited to confinement anxiety, pneumothorax, oxygen toxicity and baurotrauma Notes: Wound/Skin Impairment Nursing Diagnoses: Impaired tissue integrity Knowledge deficit related to ulceration/compromised skin integrity Goals: Patient/caregiver will verbalize understanding of skin care regimen Date Initiated: 03/17/2023 Target Resolution Date: 04/28/2023 Goal Status: Active Ulcer/skin breakdown will have a volume reduction of 30% by week 4 Date Initiated: 03/17/2023 Date Inactivated: 04/02/2023 Target Resolution Date: 03/31/2023 Goal Status: Met Ulcer/skin breakdown will have a volume reduction of 50% by week 8 Date Initiated: 04/02/2023 Target Resolution Date: 04/30/2023 Goal Status: Active Interventions: Assess patient/caregiver ability to obtain necessary supplies Assess patient/caregiver ability to perform ulcer/skin care regimen upon admission and as needed ARDIE, MCLENNAN ([id number]) [phone].pdf Page 4 of 6 Assess ulceration(s) every visit Treatment Activities: Skin care regimen initiated : 03/17/2023 Topical wound management initiated : 03/17/2023 Notes: Electronic Signature(s) Signed: 04/23/2023 4:07:14 PM By: Redmond Pulling RN, BSN Entered By: Redmond Pulling on 04/23/2023 08:38:23 -------------------------------------------------------------------------------- Pain Assessment Details Patient Name: Date of Service: [name] RD, [name] 04/23/2023 8:15 A M Medical Record Number: [id number] Patient Account Number: [id number] Date of Birth/Sex: Treating RN: Mar 19, 1951 (72 y.o. [name] Primary Care Kade Rickels: [name] Other Clinician: Referring Samanthajo Payano: Treating Jeweldean Drohan/Extender: [name] Weeks in Treatment: 7 Active Problems Location of Pain Severity and Description of Pain Patient Has Paino No Site Locations Pain Management and Medication Current Pain Management: Electronic Signature(s) Signed: 04/23/2023 4:07:14 PM By: Redmond Pulling RN, BSN Entered By: Redmond Pulling on 04/23/2023 08:23:14 -------------------------------------------------------------------------------- Patient/Caregiver Education Details Patient Name: Date of Service: [name] RD, [name] 6/27/2024andnbsp8:15 A M Medical Record Number: [id number] Patient Account Number: [id number] Date of Birth/Gender: Treating RN: 30-Aug-1951 (72 y.o. [name] Primary Care Physician: [name] Other Clinician: Referring Physician: Treating Physician/Extender: [name] [place], [place] ([id number]) [phone].pdf Page 5 of 6 Weeks in Treatment: 7 Education Assessment Education Provided To: Patient Education Topics Provided Wound/Skin Impairment: Methods: Explain/Verbal Responses: State content correctly Electronic Signature(s) Signed: 04/23/2023 4:07:14 PM By: Redmond Pulling RN, BSN Entered By: Redmond Pulling on 04/23/2023 08:39:08 -------------------------------------------------------------------------------- Wound Assessment Details Patient Name: Date of Service: [name], [name] 04/23/2023 8:15  A M Medical Record Number: [id number] Patient Account Number: [id number] Date of Birth/Sex: Treating RN: 1951/01/08 (72 y.o. [name] Primary Care Jolyssa Oplinger: [name] Other Clinician: Referring Linh Johannes: Treating Khia Dieterich/Extender: [name] Weeks in Treatment: 7 Wound Status Wound Number: 8 Primary Diabetic Wound/Ulcer of the Lower Extremity Etiology: Wound Location:  Right T Great oe Wound Open Wounding Event: Gradually Appeared Status: Date Acquired: 02/04/2023 Comorbid Hypertension, Type II Diabetes, Osteoarthritis, Neuropathy, Weeks Of Treatment: 7 History: Received Radiation Clustered Wound: No Photos Wound Measurements Length: (cm) 1.4 Width: (cm) 2.4 Depth: (cm) 0.1 Area: (cm) 2.639 Volume: (cm) 0.264 % Reduction in Area: 77.1% % Reduction in Volume: 88.6% Epithelialization: Small (1-33%) Tunneling: No Undermining: No Wound Description Classification: Grade 3 Wound Margin: Distinct, outline attached Exudate Amount: Medium Exudate Type: Serosanguineous Exudate Color: red, brown Foul Odor After Cleansing: No Slough/Fibrino Yes Wound Bed Statler, [name] ([id number]) [id number] Page 6 of 6 Granulation Amount: Large (67-100%) Exposed Structure Granulation Quality: Red, Hyper-granulation Fascia Exposed: No Necrotic Amount: Small (1-33%) Fat Layer (Subcutaneous Tissue) Exposed: Yes Necrotic Quality: Adherent Slough Tendon Exposed: No Muscle Exposed: No Joint Exposed: No Bone Exposed: Yes Periwound Skin Texture Texture Color No Abnormalities Noted: Yes No Abnormalities Noted: Yes Moisture Temperature / Pain No Abnormalities Noted: No Temperature: No Abnormality Dry / Scaly: No Maceration: No Electronic Signature(s) Signed: 04/23/2023 4:07:14 PM By: Redmond Pulling RN, BSN Entered By: Redmond Pulling on 04/23/2023 08:29:14 -------------------------------------------------------------------------------- Vitals Details Patient Name: Date of Service: [name] RD, [name] 04/23/2023 8:15 A M Medical Record Number: [id number] Patient Account Number: [id number] Date of Birth/Sex: Treating RN: Aug 23, 1951 (72 y.o. [name] Primary Care Jlyn Bracamonte: [name] Other Clinician: Referring Jamespaul Secrist: Treating Jiro Kiester/Extender: [name] Weeks in Treatment: 7 Vital Signs Time  Taken: 08:22 Temperature (F): 97.9 Height (in): 67 Pulse (bpm): 80 Weight (lbs): 154 Respiratory Rate (breaths/min): 18 Body Mass Index (BMI): 24.1 Blood Pressure (mmHg): 198/85 Reference Range: 80 - 120 mg / dl Electronic Signature(s) Signed: 04/23/2023 4:07:14 PM By: Redmond Pulling RN, BSN Entered By: Redmond Pulling on 04/23/2023 08:23:06

## 2023-04-23 NOTE — Progress Notes (Addendum)
[name], [name] ([id number]) [id number] Page 1 of 2 Visit Report for 04/23/2023 HBO Details Patient Name: Date of Service: [name] 04/23/2023 9:15 [name] M Medical Record Number: [id number] Patient Account Number: [id number] Date of Birth/Sex: Treating RN: Mar 12, 1951 (72 y.o. [name] Primary Care [name]: Abbe [name] Other Clinician: Haywood [name] Referring [name]: Treating [name]/Extender: [name] in Treatment: 7 HBO Treatment Course Details Treatment Course Number: 1 Ordering [name]: [name] T Treatments Ordered: otal 40 HBO Treatment Start Date: 04/06/2023 HBO Indication: Diabetic Ulcer(s) of the Lower Extremity Standard/Conservative Wound Care tried and failed greater than or equal to 30 days Wound #8 Right T Great oe HBO Treatment Details Treatment Number: 10 Patient Type: Outpatient Chamber Type: Monoplace Chamber Serial #: [id number] Treatment Protocol: 2.5 ATA with 90 minutes oxygen, with two 5 minute air breaks Treatment Details Compression Rate Down: 1.5 psi / minute De-Compression Rate Up: 2.0 psi / minute [name] breaks and breathing ir Compress Tx Pressure periods Decompress Decompress Begins Reached (leave unused spaces Begins Ends blank) Chamber Pressure (ATA 1 2.5 2.5 2.5 2.5 2.5 - - 2.5 1 ) Clock Time (24 hr) 09:24 09:53 10:23 10:28 10:58 11:03 - - 11:33 11:47 Treatment Length: 143 (minutes) Treatment Segments: 5 Vital Signs Capillary Blood Glucose Reference Range: 80 - 120 mg / dl HBO Diabetic Blood Glucose Intervention Range: <131 mg/dl or >324 mg/dl Type: Time Vitals Blood Pulse: Respiratory Temperature: Capillary Blood Glucose Pulse Action Taken: Pressure: Rate: Glucose (mg/dl): Meter #: Oximetry (%) Taken: Pre 09:03 162/77 72 18 97.6 212 1 given 8 oz Glucerna and 8oz Orange Juice per order Post 11:58 164/83 75 18 97.2 228 1 none per protocol Treatment  Response Treatment Toleration: Well Treatment Completion Status: Treatment Completed without Adverse Event Treatment Notes [name] arrived with vital signs within normal range. He prepared for treatment. Per orders the patient was given 8 oz of Glucerna to drink before treatment. After performing [name] safety check, the patient was placed in the chamber which was compressed with 100% oxygen at [name] rate of 2 psi/min (after confirming normal ear equalization) until reaching 18 psig. He stated that his ears were not clearing. Pressurization was reversed and reduced back to 15 psig at which point he stated his ears equalized. Compression continued to treatment depth at [name] rate set of 1 psi/min. He tolerated the treatment and subsequent decompression at the rate of 1.5 psi/min. He denied any further issues with ear equalization and/or pain related to barotrauma. He was stable upon discharge. Additional Procedure Documentation Tissue Sevierity: Necrosis of bone Physician HBO Attestation: I certify that I supervised this HBO treatment in accordance with Medicare guidelines. [name] trained emergency response team is readily available per Yes hospital policies and procedures. Continue HBOT as ordered. Yes Electronic Signature(s) Signed: 04/23/2023 3:23:57 PM By: [name] Guess MD [name], Signed: 04/23/2023 3:23:57 PM By: [name] Guess MD FACS [name] ([id number]) [id number] Page 2 of 2 Previous Signature: 04/23/2023 2:27:07 PM Version By: [name] CHT EMT BS , , Entered By: [name] on 04/23/2023 15:23:57 -------------------------------------------------------------------------------- HBO Safety Checklist Details Patient Name: Date of Service: [name], [name] 04/23/2023 9:15 [name] M Medical Record Number: [id number] Patient Account Number: [id number] Date of Birth/Sex: Treating RN: 11-20-1950 (72 y.o. [name] Primary Care [name] Other Clinician: Haywood [name] Referring Levi Crass: Treating Annisa Mazzarella/Extender: [name] in Treatment: 7 HBO Safety Checklist Items Safety Checklist Consent Form Signed Patient voided / foley secured  and emptied When did you last eato 0730 Last dose of injectable or oral agent Last night Ostomy pouch emptied and vented if applicable NA All implantable devices assessed, documented and approved NA Intravenous access site secured and place NA Valuables secured Linens and cotton and cotton/polyester blend (less than 51% polyester) Personal oil-based products / skin lotions / body lotions removed Wigs or hairpieces removed NA Smoking or tobacco materials removed NA Books / newspapers / magazines / loose paper removed Cologne, aftershave, perfume and deodorant removed Jewelry removed (may wrap wedding band) Make-up removed Hair care products removed Battery operated devices (external) removed Heating patches and chemical warmers removed Titanium eyewear removed Nail polish cured greater than 10 hours NA Casting material cured greater than 10 hours NA Hearing aids removed NA Loose dentures or partials removed dentures removed Prosthetics have been removed NA Patient demonstrates correct use of air break device (if applicable) Patient concerns have been addressed Patient grounding bracelet on and cord attached to chamber Specifics for Inpatients (complete in addition to above) Medication sheet sent with patient NA Intravenous medications needed or due during therapy sent with patient NA Drainage tubes (e.g. nasogastric tube or chest tube secured and vented) NA Endotracheal or Tracheotomy tube secured NA Cuff deflated of air and inflated with saline NA Airway suctioned NA Notes Paper version used prior to treatment start. Electronic Signature(s) Signed: 04/23/2023 2:02:27 PM By: [name] CHT EMT BS , , Entered By:  [name] on 04/23/2023 14:02:27

## 2023-04-23 NOTE — Progress Notes (Signed)
DARYAN, BUELL ([id number]) [id number] Page 1 of 1 Visit Report for 04/23/2023 SuperBill Details Patient Name: Date of Service: [name] 04/23/2023 Medical Record Number: [id number] Patient Account Number: [id number] Date of Birth/Sex: Treating RN: 1950-11-10 (72 y.o. [name] Primary Care Provider: [name] Other Clinician: Haywood Pao Referring Provider: Treating Provider/Extender: [name] in Treatment: 7 Diagnosis Coding ICD-10 Codes Code Description [phone] Non-pressure chronic ulcer of other part of right foot with necrosis of bone M86.171 Other acute osteomyelitis, right ankle and foot E11.621 Type 2 diabetes mellitus with foot ulcer I10 Essential (primary) hypertension N18.32 Chronic kidney disease, stage 3b Facility Procedures CPT4 Code Description Modifier Quantity [phone] [id number]-(Facility Use Only) HBOT full body chamber, , 5 ICD-10 Diagnosis Description E11.621 Type 2 diabetes mellitus with foot ulcer L97.514 Non-pressure chronic ulcer of other part of right foot with necrosis of bone M86.171 Other acute osteomyelitis, right ankle and foot N18.32 Chronic kidney disease, stage 3b Physician Procedures Quantity CPT4 Code Description Modifier [id number] [phone] - WC PHYS HYPERBARIC OXYGEN THERAPY 1 ICD-10 Diagnosis Description E11.621 Type 2 diabetes mellitus with foot ulcer L97.514 Non-pressure chronic ulcer of other part of right foot with necrosis of bone M86.171 Other acute osteomyelitis, right ankle and foot N18.32 Chronic kidney disease, stage 3b Electronic Signature(s) Signed: 04/23/2023 3:01:10 PM By: Haywood Pao CHT EMT BS , , Signed: 04/23/2023 3:23:03 PM By: [name] MD FACS Previous Signature: 04/23/2023 2:30:03 PM Version By: Haywood Pao CHT EMT BS , , Entered By: Haywood Pao on 04/23/2023 15:01:10

## 2023-04-24 ENCOUNTER — Encounter (HOSPITAL_BASED_OUTPATIENT_CLINIC_OR_DEPARTMENT_OTHER): Payer: Medicare HMO | Admitting: General Surgery

## 2023-04-24 DIAGNOSIS — N1832 Chronic kidney disease, stage 3b: Secondary | ICD-10-CM | POA: Diagnosis not present

## 2023-04-24 DIAGNOSIS — E11621 Type 2 diabetes mellitus with foot ulcer: Secondary | ICD-10-CM | POA: Diagnosis not present

## 2023-04-24 DIAGNOSIS — I129 Hypertensive chronic kidney disease with stage 1 through stage 4 chronic kidney disease, or unspecified chronic kidney disease: Secondary | ICD-10-CM | POA: Diagnosis not present

## 2023-04-24 DIAGNOSIS — M86171 Other acute osteomyelitis, right ankle and foot: Secondary | ICD-10-CM | POA: Diagnosis not present

## 2023-04-24 DIAGNOSIS — L97514 Non-pressure chronic ulcer of other part of right foot with necrosis of bone: Secondary | ICD-10-CM | POA: Diagnosis not present

## 2023-04-24 DIAGNOSIS — E1122 Type 2 diabetes mellitus with diabetic chronic kidney disease: Secondary | ICD-10-CM | POA: Diagnosis not present

## 2023-04-24 LAB — GLUCOSE, CAPILLARY
Glucose-Capillary: 184 mg/dL — ABNORMAL HIGH (ref 70–99)
Glucose-Capillary: 191 mg/dL — ABNORMAL HIGH (ref 70–99)

## 2023-04-24 NOTE — Progress Notes (Signed)
FORTINO, ROGALSKI ([id number]) [id number] Page 1 of 2 Visit Report for 04/24/2023 Arrival Information Details Patient Name: Date of Service: [name] 04/24/2023 8:00 A M Medical Record Number: [id number] Patient Account Number: [id number] Date of Birth/Sex: Treating RN: [birth year] (72 y.o. [name] Primary Care Dynesha Woolen: [name] Other Clinician: [name] Referring Cleveland Yarbro: Treating Zaria Taha/Extender: [name] in Treatment: 7 Visit Information History Since Last Visit All ordered tests and consults were completed: Yes Patient Arrived: Ambulatory Added or deleted any medications: No Arrival Time: 08:02 Any new allergies or adverse reactions: No Accompanied By: none Had a fall or experienced change in No Transfer Assistance: None activities of daily living that may affect Patient Identification Verified: Yes risk of falls: Secondary Verification Process Completed: Yes Signs or symptoms of abuse/neglect since last visito No Patient Requires Transmission-Based Precautions: No Hospitalized since last visit: No Patient Has Alerts: Yes Implantable device outside of the clinic excluding No Patient Alerts: Patient on Blood Thinner cellular tissue based products placed in the center Plavix since last visit: ABI R [place] Pain Present Now: No Electronic Signature(s) Signed: 04/24/2023 12:15:08 PM By: [name] EMT Entered By: [name] on 04/24/2023 12:15:08 -------------------------------------------------------------------------------- Encounter Discharge Information Details Patient Name: Date of Service: [name] 04/24/2023 8:00 A M Medical Record Number: [id number] Patient Account Number: [id number] Date of Birth/Sex: Treating RN: April 26, 1951 (72 y.o. [name] Primary Care Allea Kassner: [name] Other Clinician: [name] Referring Judd Mccubbin: Treating  Onia Shiflett/Extender: [name] in Treatment: 7 Encounter Discharge Information Items Discharge Condition: Stable Ambulatory Status: Ambulatory Discharge Destination: Home Transportation: Private Auto Accompanied By: None Schedule Follow-up Appointment: No Clinical Summary of Care: Electronic Signature(s) Signed: 04/24/2023 12:28:31 PM By: [name] EMT Entered By: [name] on 04/24/2023 12:28:31 Konen, [name] ([id number]) [id number] Page 2 of 2 -------------------------------------------------------------------------------- Vitals Details Patient Name: Date of Service: [name] 04/24/2023 8:00 A M Medical Record Number: [id number] Patient Account Number: [id number] Date of Birth/Sex: Treating RN: [birth year] (72 y.o. [name] Primary Care Michele Judy: [name] Other Clinician: [name] Referring Cleota Pellerito: Treating Shah Insley/Extender: [name]: 7 Vital Signs Time Taken: 08:07 Temperature (F): 97.1 Height (in): 67 Pulse (bpm): 75 Weight (lbs): 154 Respiratory Rate (breaths/min): 18 Body Mass Index (BMI): 24.1 Blood Pressure (mmHg): 183/96 Capillary Blood Glucose (mg/dl): 542 Reference Range: 80 - 120 mg / dl Electronic Signature(s) Signed: 04/24/2023 12:22:38 PM By: [name] EMT Entered By: [name] on 04/24/2023 12:22:37

## 2023-04-24 NOTE — Progress Notes (Signed)
[place], [name] ([id number]) [id number] Page 1 of 2 Visit Report for 04/24/2023 HBO Details Patient Name: Date of Service: [name] 04/24/2023 8:00 A M Medical Record Number: [id number] Patient Account Number: [id number] Date of Birth/Sex: Treating RN: 1951-04-28 (72 y.o. [name] Primary Care [name]: [name] Other Clinician: Karl [name] Referring [name]: Treating [name]/Extender: [name] in Treatment: 7 HBO Treatment Course Details Treatment Course Number: 1 Ordering [name]: [name] T Treatments Ordered: otal 40 HBO Treatment Start Date: 04/06/2023 HBO Indication: Diabetic Ulcer(s) of the Lower Extremity Standard/Conservative Wound Care tried and failed greater than or equal to 30 days Wound #8 Right T Great oe HBO Treatment Details Treatment Number: 11 Patient Type: Outpatient Chamber Type: Monoplace Chamber Serial #: [id number] Treatment Protocol: 2.5 ATA with 90 minutes oxygen, with two 5 minute air breaks Treatment Details Compression Rate Down: 2.0 psi / minute De-Compression Rate Up: 2.0 psi / minute A breaks and breathing ir Compress Tx Pressure periods Decompress Decompress Begins Reached (leave unused spaces Begins Ends blank) Chamber Pressure (ATA 1 2.5 2.5 2.5 2.5 2.5 - - 2.5 1 ) Clock Time (24 hr) 08:33 08:51 09:21 09:26 09:56 10:01 - - 10:31 10:41 Treatment Length: 128 (minutes) Treatment Segments: 4 Vital Signs Capillary Blood Glucose Reference Range: 80 - 120 mg / dl HBO Diabetic Blood Glucose Intervention Range: <131 mg/dl or >841 mg/dl Time Vitals Blood Respiratory Capillary Blood Glucose Pulse Action Type: Pulse: Temperature: Taken: Pressure: Rate: Glucose (mg/dl): Meter #: Oximetry (%) Taken: Pre 08:07 183/96 75 18 97.1 184 Post 10:46 193/98 79 20 97.3 191 Treatment Response Treatment Toleration: Well Treatment Completion Status: Treatment  Completed without Adverse Event Treatment Notes The patient was given 8 oz of Glucerna before treatment today per orders. Additional Procedure Documentation Tissue Sevierity: Necrosis of bone Physician HBO Attestation: I certify that I supervised this HBO treatment in accordance with Medicare guidelines. A trained emergency response team is readily available per Yes hospital policies and procedures. Continue HBOT as ordered. Yes Electronic Signature(s) Signed: 04/24/2023 12:34:36 PM By: [name] Guess MD FACS Previous Signature: 04/24/2023 12:27:09 PM Version By: [name] EMT Previous Signature: 04/24/2023 12:25:58 PM Version By: [name] EMT Entered By: [name] on 04/24/2023 12:34:36 [place], [name] ([id number]) [id number] Page 2 of 2 -------------------------------------------------------------------------------- HBO Safety Checklist Details Patient Name: Date of Service: [name] 04/24/2023 8:00 A M Medical Record Number: [id number] Patient Account Number: [id number] Date of Birth/Sex: Treating RN: [birth year] (72 y.o. [name] Primary Care [name]: [name] Other Clinician: Karl [name] Referring [name]: Treating [name]/Extender: [name] in Treatment: 7 HBO Safety Checklist Items Safety Checklist Consent Form Signed Patient voided / foley secured and emptied When did you last eato 0730 Last dose of injectable or oral agent Last night Ostomy pouch emptied and vented if applicable NA All implantable devices assessed, documented and approved NA Intravenous access site secured and place NA Valuables secured Linens and cotton and cotton/polyester blend (less than 51% polyester) Personal oil-based products / skin lotions / body lotions removed Wigs or hairpieces removed NA Smoking or tobacco materials removed Books / newspapers / magazines / loose paper  removed Cologne, aftershave, perfume and deodorant removed Jewelry removed (may wrap wedding band) Make-up removed NA Hair care products removed NA Battery operated devices (external) removed Heating patches and chemical warmers removed Titanium eyewear removed NA Nail polish cured greater than 10 hours NA Casting material cured greater than 10 hours NA Hearing  aids removed NA Loose dentures or partials removed removed by patient Prosthetics have been removed NA Patient demonstrates correct use of air break device (if applicable) Patient concerns have been addressed Patient grounding bracelet on and cord attached to chamber Specifics for Inpatients (complete in addition to above) Medication sheet sent with patient NA Intravenous medications needed or due during therapy sent with patient NA Drainage tubes (e.g. nasogastric tube or chest tube secured and vented) NA Endotracheal or Tracheotomy tube secured NA Cuff deflated of air and inflated with saline NA Airway suctioned NA Notes The safety checklist was done before the treatment started. Electronic Signature(s) Signed: 04/24/2023 12:24:02 PM By: [name] EMT Entered By: [name] on 04/24/2023 12:24:01

## 2023-04-24 NOTE — Progress Notes (Signed)
KALON, WHISENHUNT ([id number]) [id number] Page 1 of 2 Visit Report for 04/24/2023 Problem List Details Patient Name: Date of Service: [name] 04/24/2023 8:00 A M Medical Record Number: [id number] Patient Account Number: [id number] Date of Birth/Sex: Treating RN: 05/02/1951 (72 y.o. 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 No Yes necrosis of bone M86.171 Other acute osteomyelitis, right ankle and foot 03/02/2023 No Yes E11.621 Type 2 diabetes mellitus with foot ulcer 03/02/2023 No Yes I10 Essential (primary) hypertension 03/02/2023 No Yes N18.32 Chronic kidney disease, stage 3b 03/02/2023 No Yes Inactive Problems Resolved Problems ICD-10 Code Description Active Date Resolved Date L97.512 Non-pressure chronic ulcer of other part of right foot with fat layer 03/09/2023 03/09/2023 exposed Electronic Signature(s) Signed: 04/24/2023 12:27:57 PM By: [name] EMT Signed: 04/24/2023 12:33:39 PM By: [name] MD FACS Entered By: [name] on 04/24/2023 12:27:56 -------------------------------------------------------------------------------- SuperBill Details Patient Name: Date of Service: [name], [name] 04/24/2023 Medical Record Number: [id number] Patient Account Number: [id number] Date of Birth/Sex: Treating RN: 1951-03-16 (72 y.o. [name] [place], Horst ([id number]) [phone].pdf Page 2 of 2 Primary Care Provider: [name] Other Clinician: [name] Referring Provider: Treating Provider/Extender: [name] in Treatment: 7 Diagnosis Coding ICD-10  Codes Code Description [phone] Non-pressure chronic ulcer of other part of right foot with necrosis of bone M86.171 Other acute osteomyelitis, right ankle and foot E11.621 Type 2 diabetes mellitus with foot ulcer I10 Essential (primary) hypertension N18.32 Chronic kidney disease, stage 3b Facility Procedures : CPT4 Code Description: [id number] G0277-(Facility Use Only) HBOT full body chamber, , ICD-10 Diagnosis Description E11.621 Type 2 diabetes mellitus with foot ulcer L97.514 Non-pressure chronic ulcer of other part of right foot wi M86.171 Other acute  osteomyelitis, right ankle and foot Modifier: th necrosis o Quantity: 4 f bone Physician Procedures : CPT4 Code Description Modifier [id number] - WC PHYS HYPERBARIC OXYGEN THERAPY ICD-10 Diagnosis Description E11.621 Type 2 diabetes mellitus with foot ulcer L97.514 Non-pressure chronic ulcer of other part of right foot with necrosis o M86.171 Other  acute osteomyelitis, right ankle and foot Quantity: 1 f bone Electronic Signature(s) Signed: 04/24/2023 12:27:49 PM By: [name] EMT Signed: 04/24/2023 12:33:39 PM By: [name] MD FACS Entered By: [name] on 04/24/2023 12:27:48

## 2023-04-27 ENCOUNTER — Encounter (HOSPITAL_BASED_OUTPATIENT_CLINIC_OR_DEPARTMENT_OTHER): Payer: Medicare HMO | Attending: General Surgery | Admitting: General Surgery

## 2023-04-27 DIAGNOSIS — N1832 Chronic kidney disease, stage 3b: Secondary | ICD-10-CM | POA: Insufficient documentation

## 2023-04-27 DIAGNOSIS — M86171 Other acute osteomyelitis, right ankle and foot: Secondary | ICD-10-CM | POA: Insufficient documentation

## 2023-04-27 DIAGNOSIS — I129 Hypertensive chronic kidney disease with stage 1 through stage 4 chronic kidney disease, or unspecified chronic kidney disease: Secondary | ICD-10-CM | POA: Insufficient documentation

## 2023-04-27 DIAGNOSIS — L97513 Non-pressure chronic ulcer of other part of right foot with necrosis of muscle: Secondary | ICD-10-CM | POA: Diagnosis not present

## 2023-04-27 DIAGNOSIS — E1122 Type 2 diabetes mellitus with diabetic chronic kidney disease: Secondary | ICD-10-CM | POA: Insufficient documentation

## 2023-04-27 DIAGNOSIS — E11621 Type 2 diabetes mellitus with foot ulcer: Secondary | ICD-10-CM | POA: Diagnosis not present

## 2023-04-27 DIAGNOSIS — L97514 Non-pressure chronic ulcer of other part of right foot with necrosis of bone: Secondary | ICD-10-CM | POA: Diagnosis not present

## 2023-04-27 LAB — GLUCOSE, CAPILLARY
Glucose-Capillary: 249 mg/dL — ABNORMAL HIGH (ref 70–99)
Glucose-Capillary: 219 mg/dL — ABNORMAL HIGH (ref 70–99)

## 2023-04-27 NOTE — Progress Notes (Addendum)
LEFF, [name] ([id number]) [id number] Page 1 of 2 Visit Report for 04/27/2023 HBO Details Patient Name: Date of Service: [name] 04/27/2023 7:30 A M Medical Record Number: [id number] Patient Account Number: [id number] Date of Birth/Sex: Treating RN: 1950/12/07 (72 y.o. [name] Primary Care Kalieb Freeland: [name] Other Clinician: [name] Referring Vedha Tercero: Treating Lelon Ikard/Extender: [name] in Treatment: 8 HBO Treatment Course Details Treatment Course Number: 1 Ordering [place]ot [place]: [name] T Treatments Ordered: otal 40 HBO Treatment Start Date: 04/06/2023 HBO Indication: Diabetic Ulcer(s) of the Lower Extremity Standard/Conservative Wound Care tried and failed greater than or equal to 30 days Wound #8 Right T Great oe HBO Treatment Details Treatment Number: 12 Patient Type: Outpatient Chamber Type: Monoplace Chamber Serial #: [id number] Treatment Protocol: 2.5 ATA with 90 minutes oxygen, with two 5 minute air breaks Treatment Details Compression Rate Down: 2.0 psi / minute De-Compression Rate Up: 2.0 psi / minute A breaks and breathing ir Compress Tx Pressure periods Decompress Decompress Begins Reached (leave unused spaces Begins Ends blank) Chamber Pressure (ATA 1 2.5 2.5 2.5 2.5 2.5 - - 2.5 1 ) Clock Time (24 hr) 08:22 08:34 09:04 09:09 09:39 09:44 - - 10:14 10:20 Treatment Length: 118 (minutes) Treatment Segments: 4 Vital Signs Capillary Blood Glucose Reference Range: 80 - 120 mg / dl HBO Diabetic Blood Glucose Intervention Range: <131 mg/dl or >324 mg/dl Time Vitals Blood Respiratory Capillary Blood Glucose Pulse Action Type: Pulse: Temperature: Taken: Pressure: Rate: Glucose (mg/dl): Meter #: Oximetry (%) Taken: Pre 08:08 160/89 74 18 98.1 249 Post 10:52 160/78 72 18 97.1 219 Treatment Response Treatment Toleration: Well Treatment Completion Status: Treatment Completed  without Adverse Event Additional Procedure Documentation Tissue Sevierity: Necrosis of muscle Physician HBO Attestation: I certify that I supervised this HBO treatment in accordance with Medicare guidelines. A trained emergency response team is readily available per Yes hospital policies and procedures. Continue HBOT as ordered. Yes Electronic Signature(s) Signed: 04/27/2023 2:57:56 PM By: [name] MD FACS Previous Signature: 04/27/2023 1:54:34 PM Version By: [name] EMT Entered By: [name] on 04/27/2023 14:57:56 [name], [name] ([id number]) [id number] Page 2 of 2 -------------------------------------------------------------------------------- HBO Safety Checklist Details Patient Name: Date of Service: [name] 04/27/2023 7:30 A M Medical Record Number: [id number] Patient Account Number: [id number] Date of Birth/Sex: Treating RN: June 21, 1951 (72 y.o. [name] Primary Care Atiba Kimberlin: [name] Other Clinician: [name] Referring Harutyun Monteverde: Treating Sharaya Boruff/Extender: [name] in Treatment: 8 HBO Safety Checklist Items Safety Checklist Consent Form Signed Patient voided / foley secured and emptied When did you last eato 0700 Last dose of injectable or oral agent Last night Ostomy pouch emptied and vented if applicable NA All implantable devices assessed, documented and approved NA Intravenous access site secured and place NA Valuables secured Linens and cotton and cotton/polyester blend (less than 51% polyester) Personal oil-based products / skin lotions / body lotions removed Wigs or hairpieces removed NA Smoking or tobacco materials removed Books / newspapers / magazines / loose paper removed Cologne, aftershave, perfume and deodorant removed Jewelry removed (may wrap wedding band) Make-up removed NA Hair care products removed Battery operated devices (external)  removed Heating patches and chemical warmers removed Titanium eyewear removed NA Nail polish cured greater than 10 hours NA Casting material cured greater than 10 hours NA Hearing aids removed NA Loose dentures or partials removed removed by patient Prosthetics have been removed NA Patient demonstrates correct use of air break device (if applicable)  Patient concerns have been addressed Patient grounding bracelet on and cord attached to chamber Specifics for Inpatients (complete in addition to above) Medication sheet sent with patient NA Intravenous medications needed or due during therapy sent with patient NA Drainage tubes (e.g. nasogastric tube or chest tube secured and vented) NA Endotracheal or Tracheotomy tube secured NA Cuff deflated of air and inflated with saline NA Airway suctioned NA Notes The safety checklist was done before the treatment started. Electronic Signature(s) Signed: 04/27/2023 1:53:10 PM By: [name] EMT Entered By: [name] on 04/27/2023 13:53:10

## 2023-04-27 NOTE — Progress Notes (Signed)
[name], [name] ([id number]) [id number] Page 1 of 2 Visit Report for 04/27/2023 Arrival Information Details Patient Name: Date of Service: [name] 04/27/2023 7:30 A M Medical Record Number: [id number] Patient Account Number: [id number] Date of Birth/Sex: Treating RN: [birth year] (72 y.o. [name] Primary Care Alvie Fowles: [name] Other Clinician: [name] Referring Bernd Crom: Treating Jabreel Chimento/Extender: [name] in Treatment: 8 Visit Information History Since Last Visit All ordered tests and consults were completed: Yes Patient Arrived: Ambulatory Added or deleted any medications: No Arrival Time: 08:00 Any new allergies or adverse reactions: No Accompanied By: None Had a fall or experienced change in No Transfer Assistance: None activities of daily living that may affect Patient Identification Verified: Yes risk of falls: Secondary Verification Process Completed: Yes Signs or symptoms of abuse/neglect since last visito No Patient Requires Transmission-Based Precautions: No Hospitalized since last visit: No Patient Has Alerts: Yes Implantable device outside of the clinic excluding No Patient Alerts: Patient on Blood Thinner cellular tissue based products placed in the center Plavix since last visit: ABI R [place] Pain Present Now: No Electronic Signature(s) Signed: 04/27/2023 1:50:59 PM By: [name] EMT Entered By: [name] on 04/27/2023 13:50:59 -------------------------------------------------------------------------------- Encounter Discharge Information Details Patient Name: Date of Service: [name] RD, [name] 04/27/2023 7:30 A M Medical Record Number: [id number] Patient Account Number: [id number] Date of Birth/Sex: Treating RN: [birth year] (72 y.o. [name] Primary Care Hoyte Ziebell: [name] Other Clinician: [name] Referring Aliahna Statzer: Treating Alora Gorey/Extender:  [name] in Treatment: 8 Encounter Discharge Information Items Discharge Condition: Stable Ambulatory Status: Ambulatory Discharge Destination: Home Transportation: Private Auto Accompanied By: None Schedule Follow-up Appointment: Yes Clinical Summary of Care: Electronic Signature(s) Signed: 04/27/2023 1:55:53 PM By: [name] EMT Entered By: [name] on 04/27/2023 13:55:53 [place], [name] ([id number]) [id number] Page 2 of 2 -------------------------------------------------------------------------------- Vitals Details Patient Name: Date of Service: [name] 04/27/2023 7:30 A M Medical Record Number: [id number] Patient Account Number: [id number] Date of Birth/Sex: Treating RN: 1951/06/23 (72 y.o. [name] Primary Care Ebonique Hallstrom: [name] Other Clinician: [name] Referring Nahal Wanless: Treating Sixto Bowdish/Extender: [name]: 8 Vital Signs Time Taken: 08:08 Temperature (F): 98.1 Height (in): 67 Pulse (bpm): 74 Weight (lbs): 154 Respiratory Rate (breaths/min): 18 Body Mass Index (BMI): 24.1 Blood Pressure (mmHg): 160/89 Capillary Blood Glucose (mg/dl): 244 Reference Range: 80 - 120 mg / dl Electronic Signature(s) Signed: 04/27/2023 1:51:56 PM By: [name] EMT Entered By: [name] on 04/27/2023 13:51:56

## 2023-04-27 NOTE — Progress Notes (Signed)
DANILO, MCGREGOR ([id number]) [id number] Page 1 of 2 Visit Report for 04/27/2023 Problem List Details Patient Name: Date of Service: [name] 04/27/2023 7:30 [name] M Medical Record Number: [id number] Patient Account Number: [id number] Date of Birth/Sex: Treating RN: 11/09/1950 (72 y.o. 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 M86.171 Other acute osteomyelitis, right ankle and foot 03/02/2023 No Yes E11.621 Type 2 diabetes mellitus with foot ulcer 03/02/2023 No Yes I10 Essential (primary) hypertension 03/02/2023 No Yes N18.32 Chronic kidney disease, stage 3b 03/02/2023 No Yes Inactive Problems Resolved Problems ICD-10 Code Description Active Date Resolved Date L97.512 Non-pressure chronic ulcer of other part of right foot with fat layer 03/09/2023 03/09/2023 exposed Electronic Signature(s) Signed: 04/27/2023 1:55:01 PM By: [name] EMT Signed: 04/27/2023 2:57:10 PM By: [name] MD FACS Entered By: [name] on 04/27/2023 13:55:01 -------------------------------------------------------------------------------- SuperBill Details Patient Name: Date of Service: [name], [name] 04/27/2023 Medical Record Number: [id number] Patient Account Number: [id number] Date of Birth/Sex: Treating RN: Mar 18, 1951 (72 y.o. [name] [place], [place] ([id number]) [phone].pdf Page 2 of 2 Primary Care Provider: [name] Other Clinician: [name] Referring Provider: Treating Provider/Extender: [name] in Treatment: 8 Diagnosis Coding ICD-10 Codes Code  Description [phone] Non-pressure chronic ulcer of other part of right foot with necrosis of bone M86.171 Other acute osteomyelitis, right ankle and foot E11.621 Type 2 diabetes mellitus with foot ulcer I10 Essential (primary) hypertension N18.32 Chronic kidney disease, stage 3b Facility Procedures : CPT4 Code Description: [id number] G0277-(Facility Use Only) HBOT full body chamber, , ICD-10 Diagnosis Description E11.621 Type 2 diabetes mellitus with foot ulcer L97.514 Non-pressure chronic ulcer of other part of right foot wi M86.171 Other acute  osteomyelitis, right ankle and foot Modifier: th necrosis o Quantity: 4 f bone Physician Procedures : CPT4 Code Description Modifier [id number] - WC PHYS HYPERBARIC OXYGEN THERAPY ICD-10 Diagnosis Description E11.621 Type 2 diabetes mellitus with foot ulcer L97.514 Non-pressure chronic ulcer of other part of right foot with necrosis o M86.171 Other  acute osteomyelitis, right ankle and foot Quantity: 1 f bone Electronic Signature(s) Signed: 04/27/2023 1:54:55 PM By: [name] EMT Signed: 04/27/2023 2:57:10 PM By: [name] MD FACS Entered By: [name] on 04/27/2023 13:54:55

## 2023-04-28 ENCOUNTER — Telehealth: Payer: Self-pay

## 2023-04-28 ENCOUNTER — Encounter (HOSPITAL_BASED_OUTPATIENT_CLINIC_OR_DEPARTMENT_OTHER): Payer: Medicare HMO | Admitting: General Surgery

## 2023-04-28 DIAGNOSIS — N1832 Chronic kidney disease, stage 3b: Secondary | ICD-10-CM | POA: Diagnosis not present

## 2023-04-28 DIAGNOSIS — L97513 Non-pressure chronic ulcer of other part of right foot with necrosis of muscle: Secondary | ICD-10-CM | POA: Diagnosis not present

## 2023-04-28 DIAGNOSIS — I129 Hypertensive chronic kidney disease with stage 1 through stage 4 chronic kidney disease, or unspecified chronic kidney disease: Secondary | ICD-10-CM | POA: Diagnosis not present

## 2023-04-28 DIAGNOSIS — E1122 Type 2 diabetes mellitus with diabetic chronic kidney disease: Secondary | ICD-10-CM | POA: Diagnosis not present

## 2023-04-28 DIAGNOSIS — E11621 Type 2 diabetes mellitus with foot ulcer: Secondary | ICD-10-CM | POA: Diagnosis not present

## 2023-04-28 DIAGNOSIS — M86171 Other acute osteomyelitis, right ankle and foot: Secondary | ICD-10-CM | POA: Diagnosis not present

## 2023-04-28 DIAGNOSIS — L97514 Non-pressure chronic ulcer of other part of right foot with necrosis of bone: Secondary | ICD-10-CM | POA: Diagnosis not present

## 2023-04-28 LAB — GLUCOSE, CAPILLARY
Glucose-Capillary: 244 mg/dL — ABNORMAL HIGH (ref 70–99)
Glucose-Capillary: 215 mg/dL — ABNORMAL HIGH (ref 70–99)

## 2023-04-28 NOTE — Progress Notes (Addendum)
[name] ([id number]) [id number] Page 1 of 2 Visit Report for 04/28/2023 HBO Details Patient Name: Date of Service: [name] 04/28/2023 7:30 A M Medical Record Number: [id number] Patient Account Number: [id number] Date of Birth/Sex: Treating RN: [birth year] (72 y.o. [name], [name] Primary Care [name]: [name] Other Clinician: Karl [name] Referring [name]: Treating [name]/Extender: [name] in Treatment: 8 HBO Treatment Course Details Treatment Course Number: 1 Ordering [name]: [name] T Treatments Ordered: otal 40 HBO Treatment Start Date: 04/06/2023 HBO Indication: Diabetic Ulcer(s) of the Lower Extremity Standard/Conservative Wound Care tried and failed greater than or equal to 30 days Wound #8 Right T Great oe HBO Treatment Details Treatment Number: 13 Patient Type: Outpatient Chamber Type: Monoplace Chamber Serial #: [id number] Treatment Protocol: 2.5 ATA with 90 minutes oxygen, with two 5 minute air breaks Treatment Details Compression Rate Down: 2.0 psi / minute De-Compression Rate Up: 2.0 psi / minute A breaks and breathing ir Compress Tx Pressure periods Decompress Decompress Begins Reached (leave unused spaces Begins Ends blank) Chamber Pressure (ATA 1 2.5 2.5 2.5 2.5 2.5 - - 2.5 1 ) Clock Time (24 hr) 08:35 08:49 09:19 09:24 09:54 09:59 - - 10:29 10:38 Treatment Length: 123 (minutes) Treatment Segments: 4 Vital Signs Capillary Blood Glucose Reference Range: 80 - 120 mg / dl HBO Diabetic Blood Glucose Intervention Range: <131 mg/dl or >253 mg/dl Time Vitals Blood Respiratory Capillary Blood Glucose Pulse Action Type: Pulse: Temperature: Taken: Pressure: Rate: Glucose (mg/dl): Meter #: Oximetry (%) Taken: Pre 08:04 161/89 79 18 97.5 244 Post 10:43 200/109 83 18 97.2 215 Treatment Response Treatment Toleration: Well Treatment Completion Status: Treatment Completed  without Adverse Event Treatment Notes Dr. Lady [name] informed of blood pressure. Additional Procedure Documentation Tissue Sevierity: Necrosis of muscle Physician HBO Attestation: I certify that I supervised this HBO treatment in accordance with Medicare guidelines. A trained emergency response team is readily available per Yes hospital policies and procedures. Continue HBOT as ordered. Yes Electronic Signature(s) Signed: 04/28/2023 12:28:32 PM By: [name] Guess MD FACS Previous Signature: 04/28/2023 12:00:25 PM Version By: [name] EMT Entered By: [name] on 04/28/2023 12:28:32 Mormile, Jobe [name] ([id number]) [id number] Page 2 of 2 -------------------------------------------------------------------------------- HBO Safety Checklist Details Patient Name: Date of Service: [name] 04/28/2023 7:30 A M Medical Record Number: [id number] Patient Account Number: [id number] Date of Birth/Sex: Treating RN: 02-28-1951 (72 y.o. [name], [name] Primary Care [name]: [name] Other Clinician: Karl [name] Referring [name]: Treating [name]/Extender: [name] in Treatment: 8 HBO Safety Checklist Items Safety Checklist Consent Form Signed Patient voided / foley secured and emptied When did you last eato 0745 - Given 8 oz Glucerna Last dose of injectable or oral agent Yesterday Ostomy pouch emptied and vented if applicable NA All implantable devices assessed, documented and approved NA Intravenous access site secured and place NA Valuables secured Linens and cotton and cotton/polyester blend (less than 51% polyester) Personal oil-based products / skin lotions / body lotions removed Wigs or hairpieces removed NA Smoking or tobacco materials removed NA Books / newspapers / magazines / loose paper removed Cologne, aftershave, perfume and deodorant removed Jewelry removed (may wrap wedding band) Make-up  removed NA Hair care products removed Battery operated devices (external) removed Heating patches and chemical warmers removed Titanium eyewear removed Nail polish cured greater than 10 hours NA Casting material cured greater than 10 hours NA Hearing aids removed NA Loose dentures or partials removed left at home Prosthetics have been  removed NA Patient demonstrates correct use of air break device (if applicable) Patient concerns have been addressed Patient grounding bracelet on and cord attached to chamber Specifics for Inpatients (complete in addition to above) Medication sheet sent with patient NA Intravenous medications needed or due during therapy sent with patient NA Drainage tubes (e.g. nasogastric tube or chest tube secured and vented) NA Endotracheal or Tracheotomy tube secured NA Cuff deflated of air and inflated with saline NA Airway suctioned NA Notes Paper version used prior to treatment start. Electronic Signature(s) Signed: 04/28/2023 9:19:50 AM By: [name] CHT EMT BS , , Entered By: [name] on 04/28/2023 09:19:50

## 2023-04-28 NOTE — Telephone Encounter (Signed)
[name], pharmacist from [facility], called to check on BP medications (metoprolol and carvedilol). Checked in chart, metoprolol tartrate 25 MG was discontinued by cardiology on 07/25/22. Carvedilol 25 MG is currently listed on med list. [name] states the pt picked up a refill of the metoprolol from local pharmacy last week as the [facility] still has medication [facility] under PCP signature. [name] stated she will call pt to discuss medications. I called [facility] to have them remove the metoprolol tartrate 25 MG, Patty stated the med was refilled on 04/19/23 but has not been picked up. She informed me the last time he picked up a refill was 05/29/22, and that the medication will be removed from list and refill will be put back as it has been discontinued by cardiology.

## 2023-04-28 NOTE — Progress Notes (Addendum)
RICKSON, PULLIAN ([id number]) [id number] Page 1 of 2 Visit Report for 04/28/2023 Arrival Information Details Patient Name: Date of Service: [name] 04/28/2023 7:30 A M Medical Record Number: [id number] Patient Account Number: [id number] Date of Birth/Sex: Treating RN: [birth year] (72 y.o. [name] Primary Care Mykaela Arena: [name] Other Clinician: Haywood Pao Referring Narda Fundora: Treating Annette Bertelson/Extender: [name] in Treatment: 8 Visit Information History Since Last Visit All ordered tests and consults were completed: Yes Patient Arrived: Ambulatory Added or deleted any medications: No Arrival Time: 07:58 Any new allergies or adverse reactions: No Accompanied By: self Had a fall or experienced change in No Transfer Assistance: None activities of daily living that may affect Patient Identification Verified: Yes risk of falls: Secondary Verification Process Completed: Yes Signs or symptoms of abuse/neglect since last visito No Patient Requires Transmission-Based Precautions: No Hospitalized since last visit: No Patient Has Alerts: Yes Implantable device outside of the clinic excluding No Patient Alerts: Patient on Blood Thinner cellular tissue based products placed in the center Plavix since last visit: ABI R [place] Pain Present Now: No Electronic Signature(s) Signed: 04/28/2023 9:17:46 AM By: Haywood Pao CHT EMT BS , , Entered By: Haywood Pao on 04/28/2023 09:17:45 -------------------------------------------------------------------------------- Encounter Discharge Information Details Patient Name: Date of Service: [name], [name] 04/28/2023 7:30 A M Medical Record Number: [id number] Patient Account Number: [id number] Date of Birth/Sex: Treating RN: 1951/07/21 (72 y.o. [name], [name] Primary Care Kieanna Rollo: [name] Other Clinician: [name] Referring Shavar Gorka: Treating  Key Cen/Extender: [name] in Treatment: 8 Encounter Discharge Information Items Discharge Condition: Stable Ambulatory Status: Ambulatory Discharge Destination: Home Transportation: Private Auto Accompanied By: none Schedule Follow-up Appointment: Yes Clinical Summary of Care: Electronic Signature(s) Signed: 04/28/2023 12:01:33 PM By: [name] EMT Entered By: [name] on 04/28/2023 12:01:33 [name] ([id number]) [id number] Page 2 of 2 -------------------------------------------------------------------------------- Vitals Details Patient Name: Date of Service: [name] 04/28/2023 7:30 A M Medical Record Number: [id number] Patient Account Number: [id number] Date of Birth/Sex: Treating RN: [birth year] (72 y.o. [name], [name] Primary Care Dosha Broshears: [name] Other Clinician: Haywood Pao Referring Kit Mollett: Treating Hailie Searight/Extender: [name]: 8 Vital Signs Time Taken: 08:04 Temperature (F): 97.5 Height (in): 67 Pulse (bpm): 79 Weight (lbs): 154 Respiratory Rate (breaths/min): 18 Body Mass Index (BMI): 24.1 Blood Pressure (mmHg): 161/89 Capillary Blood Glucose (mg/dl): 244 Reference Range: 80 - 120 mg / dl Electronic Signature(s) Signed: 04/28/2023 9:18:16 AM By: Haywood Pao CHT EMT BS , , Entered By: Haywood Pao on 04/28/2023 09:18:16

## 2023-04-28 NOTE — Progress Notes (Signed)
MONTERIUS, UHL ([id number]) [id number] Page 1 of 2 Visit Report for 04/28/2023 Problem List Details Patient Name: Date of Service: [name] 04/28/2023 7:30 [name] M Medical Record Number: [id number] Patient Account Number: [id number] Date of Birth/Sex: Treating RN: [birth year] (72 y.o. 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 M86.171 Other acute osteomyelitis, right ankle and foot 03/02/2023 No Yes E11.621 Type 2 diabetes mellitus with foot ulcer 03/02/2023 No Yes I10 Essential (primary) hypertension 03/02/2023 No Yes N18.32 Chronic kidney disease, stage 3b 03/02/2023 No Yes Inactive Problems Resolved Problems ICD-10 Code Description Active Date Resolved Date L97.512 Non-pressure chronic ulcer of other part of right foot with fat layer 03/09/2023 03/09/2023 exposed Electronic Signature(s) Signed: 04/28/2023 12:00:56 PM By: [name] EMT Signed: 04/28/2023 12:28:07 PM By: [name] MD FACS Entered By: [name] on 04/28/2023 12:00:56 -------------------------------------------------------------------------------- SuperBill Details Patient Name: Date of Service: [name], [name] 04/28/2023 Medical Record Number: [id number] Patient Account Number: [id number] Date of Birth/Sex: Treating RN: Jul 21, 1951 (72 y.o. [name] Castine, Pranay ([id number]) [id number] Page 2 of 2 Primary Care Provider: [name] Other Clinician: [name] Referring Provider: Treating Provider/Extender: [name] in Treatment: 8 Diagnosis Coding ICD-10 Codes Code  Description [phone] Non-pressure chronic ulcer of other part of right foot with necrosis of bone M86.171 Other acute osteomyelitis, right ankle and foot E11.621 Type 2 diabetes mellitus with foot ulcer I10 Essential (primary) hypertension N18.32 Chronic kidney disease, stage 3b Facility Procedures : CPT4 Code Description: [id number] G0277-(Facility Use Only) HBOT full body chamber, , ICD-10 Diagnosis Description E11.621 Type 2 diabetes mellitus with foot ulcer L97.514 Non-pressure chronic ulcer of other part of right foot wi M86.171 Other acute  osteomyelitis, right ankle and foot Modifier: th necrosis o Quantity: 4 f bone Physician Procedures : CPT4 Code Description Modifier [id number] 99183 - WC PHYS HYPERBARIC OXYGEN THERAPY ICD-10 Diagnosis Description E11.621 Type 2 diabetes mellitus with foot ulcer L97.514 Non-pressure chronic ulcer of other part of right foot with necrosis o M86.171 Other  acute osteomyelitis, right ankle and foot Quantity: 1 f bone Electronic Signature(s) Signed: 04/28/2023 12:00:50 PM By: [name] EMT Signed: 04/28/2023 12:28:07 PM By: [name] MD FACS Entered By: [name] on 04/28/2023 12:00:50

## 2023-04-29 ENCOUNTER — Encounter (HOSPITAL_BASED_OUTPATIENT_CLINIC_OR_DEPARTMENT_OTHER): Payer: Medicare HMO | Admitting: General Surgery

## 2023-04-29 DIAGNOSIS — L97514 Non-pressure chronic ulcer of other part of right foot with necrosis of bone: Secondary | ICD-10-CM | POA: Diagnosis not present

## 2023-04-29 DIAGNOSIS — I129 Hypertensive chronic kidney disease with stage 1 through stage 4 chronic kidney disease, or unspecified chronic kidney disease: Secondary | ICD-10-CM | POA: Diagnosis not present

## 2023-04-29 DIAGNOSIS — E1122 Type 2 diabetes mellitus with diabetic chronic kidney disease: Secondary | ICD-10-CM | POA: Diagnosis not present

## 2023-04-29 DIAGNOSIS — M86171 Other acute osteomyelitis, right ankle and foot: Secondary | ICD-10-CM | POA: Diagnosis not present

## 2023-04-29 DIAGNOSIS — L97513 Non-pressure chronic ulcer of other part of right foot with necrosis of muscle: Secondary | ICD-10-CM | POA: Diagnosis not present

## 2023-04-29 DIAGNOSIS — L97512 Non-pressure chronic ulcer of other part of right foot with fat layer exposed: Secondary | ICD-10-CM | POA: Diagnosis not present

## 2023-04-29 DIAGNOSIS — E11621 Type 2 diabetes mellitus with foot ulcer: Secondary | ICD-10-CM | POA: Diagnosis not present

## 2023-04-29 DIAGNOSIS — N1832 Chronic kidney disease, stage 3b: Secondary | ICD-10-CM | POA: Diagnosis not present

## 2023-04-29 LAB — GLUCOSE, CAPILLARY
Glucose-Capillary: 213 mg/dL — ABNORMAL HIGH (ref 70–99)
Glucose-Capillary: 250 mg/dL — ABNORMAL HIGH (ref 70–99)

## 2023-04-29 NOTE — Progress Notes (Signed)
[place], [name] ([id number]) [id number] Page 1 of 2 Visit Report for 04/29/2023 HBO Details Patient Name: Date of Service: [name] 04/29/2023 7:30 A M Medical Record Number: [id number] Patient Account Number: [id number] Date of Birth/Sex: Treating RN: 1951-05-07 (72 y.o. [name] Primary Care [name]: [name] Other Clinician: [name] Referring [name] [name]: [name] in Treatment: 8 HBO Treatment Course Details Treatment Course Number: 1 Ordering [name]: [name] T Treatments Ordered: otal 40 HBO Treatment Start Date: 04/06/2023 HBO Indication: Diabetic Ulcer(s) of the Lower Extremity Standard/Conservative Wound Care tried and failed greater than or equal to 30 days Wound #8 Right T Great oe HBO Treatment Details Treatment Number: 14 Patient Type: Outpatient Chamber Type: Monoplace Chamber Serial #: [id number] Treatment Protocol: 2.5 ATA with 90 minutes oxygen, with two 5 minute air breaks Treatment Details Compression Rate Down: 2.0 psi / minute De-Compression Rate Up: 2.0 psi / minute A breaks and breathing ir Compress Tx Pressure periods Decompress Decompress Begins Reached (leave unused spaces Begins Ends blank) Chamber Pressure (ATA 1 2.5 2.5 2.5 2.5 2.5 - - 2.5 1 ) Clock Time (24 hr) 08:15 08:29 08:59 09:04 09:34 09:39 - - 10:09 10:22 Treatment Length: 127 (minutes) Treatment Segments: 4 Vital Signs Capillary Blood Glucose Reference Range: 80 - 120 mg / dl HBO Diabetic Blood Glucose Intervention Range: <131 mg/dl or >474 mg/dl Time Vitals Blood Respiratory Capillary Blood Glucose Pulse Action Type: Pulse: Temperature: Taken: Pressure: Rate: Glucose (mg/dl): Meter #: Oximetry (%) Taken: Pre 07:59 118/68 61 18 97.3 213 Post 10:27 153/70 67 18 97.3 250 Treatment Response Treatment Toleration: Well Treatment Completion Status: Treatment  Completed without Adverse Event Additional Procedure Documentation Tissue Sevierity: Necrosis of muscle Physician HBO Attestation: I certify that I supervised this HBO treatment in accordance with Medicare guidelines. A trained emergency response team is readily available per Yes hospital policies and procedures. Continue HBOT as ordered. Yes Electronic Signature(s) Signed: 04/29/2023 4:42:42 PM By: [name] MD FACS Previous Signature: 04/29/2023 3:55:08 PM Version By: [name] EMT Entered By: [name] on 04/29/2023 16:42:42 Bartol, Kenta ([id number]) [id number] Page 2 of 2 -------------------------------------------------------------------------------- HBO Safety Checklist Details Patient Name: Date of Service: [name] 04/29/2023 7:30 A M Medical Record Number: [id number] Patient Account Number: [id number] Date of Birth/Sex: Treating RN: 1951/08/20 (72 y.o. [name] Primary Care Annalina Needles: [name] Other Clinician: [name] Referring [name]: Treating [name]: [name]: 8 HBO Safety Checklist Items Safety Checklist Consent Form Signed Patient voided / foley secured and emptied When did you last eato 0715 Last dose of injectable or oral agent Yesterday Ostomy pouch emptied and vented if applicable NA All implantable devices assessed, documented and approved NA Intravenous access site secured and place NA Valuables secured Linens and cotton and cotton/polyester blend (less than 51% polyester) Personal oil-based products / skin lotions / body lotions removed Wigs or hairpieces removed NA Smoking or tobacco materials removed Books / newspapers / magazines / loose paper removed Cologne, aftershave, perfume and deodorant removed Jewelry removed (may wrap wedding band) Make-up removed NA Hair care products removed Battery operated devices  (external) removed Heating patches and chemical warmers removed Titanium eyewear removed NA Nail polish cured greater than 10 hours NA Casting material cured greater than 10 hours NA Hearing aids removed NA Loose dentures or partials removed removed by patient Prosthetics have been removed NA Patient demonstrates correct use of air break device (if applicable) Patient  concerns have been addressed Patient grounding bracelet on and cord attached to chamber Specifics for Inpatients (complete in addition to above) Medication sheet sent with patient NA Intravenous medications needed or due during therapy sent with patient NA Drainage tubes (e.g. nasogastric tube or chest tube secured and vented) NA Endotracheal or Tracheotomy tube secured NA Cuff deflated of air and inflated with saline NA Airway suctioned NA Electronic Signature(s) Signed: 04/29/2023 3:53:12 PM By: [name] EMT Entered By: [name] on 04/29/2023 15:53:12

## 2023-04-29 NOTE — Progress Notes (Addendum)
[name] ([id number]) [id number] Page 1 of 2 Visit Report for 04/29/2023 Arrival Information Details Patient Name: Date of Service: [name] 04/29/2023 7:30 A M Medical Record Number: [id number] Patient Account Number: [id number] Date of Birth/Sex: Treating RN: 10/13/1951 (72 y.o. [name] Primary Care Roslin Norwood: [name] Other Clinician: [name] Referring Kile Kabler: Treating Odin Mariani/Extender: [name] in Treatment: 8 Visit Information History Since Last Visit All ordered tests and consults were completed: Yes Patient Arrived: Ambulatory Added or deleted any medications: No Arrival Time: 07:54 Any new allergies or adverse reactions: No Accompanied By: self Had a fall or experienced change in No Transfer Assistance: None activities of daily living that may affect Patient Identification Verified: Yes risk of falls: Secondary Verification Process Completed: Yes Signs or symptoms of abuse/neglect since last visito No Patient Requires Transmission-Based Precautions: No Hospitalized since last visit: No Patient Has Alerts: Yes Implantable device outside of the clinic excluding No Patient Alerts: Patient on Blood Thinner cellular tissue based products placed in the center Plavix since last visit: ABI R [place] Pain Present Now: No Electronic Signature(s) Signed: 04/29/2023 12:33:13 PM By: Haywood Pao CHT EMT BS , , Entered By: Haywood Pao on 04/29/2023 12:33:13 -------------------------------------------------------------------------------- Encounter Discharge Information Details Patient Name: Date of Service: [name] 04/29/2023 7:30 A M Medical Record Number: [id number] Patient Account Number: [id number] Date of Birth/Sex: Treating RN: 1950-11-03 (72 y.o. [name] Primary Care Rosco Harriott: [name] Other Clinician: [name] Referring  Cele Mote: Treating Fernande Treiber/Extender: [name] in Treatment: 8 Encounter Discharge Information Items Discharge Condition: Stable Ambulatory Status: Ambulatory Discharge Destination: Home Transportation: Private Auto Accompanied By: None Schedule Follow-up Appointment: Yes Clinical Summary of Care: Electronic Signature(s) Signed: 04/29/2023 3:56:54 PM By: [name] EMT Entered By: [name] on 04/29/2023 15:56:54 [place], Jobe [name] ([id number]) [id number] Page 2 of 2 -------------------------------------------------------------------------------- Vitals Details Patient Name: Date of Service: [name] 04/29/2023 7:30 A M Medical Record Number: [id number] Patient Account Number: [id number] Date of Birth/Sex: Treating RN: [birth year] (72 y.o. [name] Primary Care Tayari Yankee: [name] Other Clinician: Haywood Pao Referring Jenavieve Freda: Treating Gunnison Chahal/Extender: [name] in Treatment: 8 Vital Signs Time Taken: 07:59 Temperature (F): 97.3 Height (in): 67 Pulse (bpm): 61 Weight (lbs): 154 Respiratory Rate (breaths/min): 18 Body Mass Index (BMI): 24.1 Blood Pressure (mmHg): 118/68 Capillary Blood Glucose (mg/dl): 623 Reference Range: 80 - 120 mg / dl Electronic Signature(s) Signed: 04/29/2023 12:34:59 PM By: Haywood Pao CHT EMT BS , , Entered By: Haywood Pao on 04/29/2023 12:34:59

## 2023-04-29 NOTE — Progress Notes (Signed)
[place], [name] ([id number]) [id number] Page 1 of 2 Visit Report for 04/29/2023 Problem List Details Patient Name: Date of Service: [name] 04/29/2023 7:30 [name] M Medical Record Number: [id number] Patient Account Number: [id number] Date of Birth/Sex: Treating RN: September 27, 1951 (72 y.o. 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 M86.171 Other acute osteomyelitis, right ankle and foot 03/02/2023 No Yes E11.621 Type 2 diabetes mellitus with foot ulcer 03/02/2023 No Yes I10 Essential (primary) hypertension 03/02/2023 No Yes N18.32 Chronic kidney disease, stage 3b 03/02/2023 No Yes Inactive Problems Resolved Problems ICD-10 Code Description Active Date Resolved Date L97.512 Non-pressure chronic ulcer of other part of right foot with fat layer 03/09/2023 03/09/2023 exposed Electronic Signature(s) Signed: 04/29/2023 3:55:40 PM By: [name] EMT Signed: 04/29/2023 4:41:42 PM By: [name] MD FACS Entered By: [name] on 04/29/2023 15:55:39 -------------------------------------------------------------------------------- SuperBill Details Patient Name: Date of Service: [name], [name] 04/29/2023 Medical Record Number: [id number] Patient Account Number: [id number] Date of Birth/Sex: Treating RN: April 24, 1951 (72 y.o. [name] [place], [name] [id number] Page 2 of 2 Primary Care Provider: [name] Other Clinician: [name] Referring Provider: Treating Provider/Extender: [name] in Treatment: 8 Diagnosis Coding ICD-10 Codes Code  Description [phone] Non-pressure chronic ulcer of other part of right foot with necrosis of bone M86.171 Other acute osteomyelitis, right ankle and foot E11.621 Type 2 diabetes mellitus with foot ulcer I10 Essential (primary) hypertension N18.32 Chronic kidney disease, stage 3b Facility Procedures : CPT4 Code Description: [id number] G0277-(Facility Use Only) HBOT full body chamber, , ICD-10 Diagnosis Description E11.621 Type 2 diabetes mellitus with foot ulcer L97.514 Non-pressure chronic ulcer of other part of right foot wi M86.171 Other acute  osteomyelitis, right ankle and foot Modifier: th necrosis o Quantity: 4 f bone Physician Procedures : CPT4 Code Description Modifier [id number] 99183 - WC PHYS HYPERBARIC OXYGEN THERAPY ICD-10 Diagnosis Description E11.621 Type 2 diabetes mellitus with foot ulcer L97.514 Non-pressure chronic ulcer of other part of right foot with necrosis o M86.171 Other  acute osteomyelitis, right ankle and foot Quantity: 1 f bone Electronic Signature(s) Signed: 04/29/2023 3:55:32 PM By: [name] EMT Signed: 04/29/2023 4:41:42 PM By: [name] MD FACS Entered By: [name] on 04/29/2023 15:55:32

## 2023-05-01 ENCOUNTER — Encounter (HOSPITAL_BASED_OUTPATIENT_CLINIC_OR_DEPARTMENT_OTHER): Payer: Medicare HMO | Admitting: General Surgery

## 2023-05-04 ENCOUNTER — Other Ambulatory Visit: Payer: Self-pay | Admitting: Family Medicine

## 2023-05-04 ENCOUNTER — Encounter (HOSPITAL_BASED_OUTPATIENT_CLINIC_OR_DEPARTMENT_OTHER): Payer: Medicare HMO | Admitting: General Surgery

## 2023-05-04 DIAGNOSIS — N1832 Chronic kidney disease, stage 3b: Secondary | ICD-10-CM | POA: Diagnosis not present

## 2023-05-04 DIAGNOSIS — L97514 Non-pressure chronic ulcer of other part of right foot with necrosis of bone: Secondary | ICD-10-CM | POA: Diagnosis not present

## 2023-05-04 DIAGNOSIS — M86171 Other acute osteomyelitis, right ankle and foot: Secondary | ICD-10-CM | POA: Diagnosis not present

## 2023-05-04 DIAGNOSIS — E1122 Type 2 diabetes mellitus with diabetic chronic kidney disease: Secondary | ICD-10-CM | POA: Diagnosis not present

## 2023-05-04 DIAGNOSIS — E11621 Type 2 diabetes mellitus with foot ulcer: Secondary | ICD-10-CM | POA: Diagnosis not present

## 2023-05-04 DIAGNOSIS — I129 Hypertensive chronic kidney disease with stage 1 through stage 4 chronic kidney disease, or unspecified chronic kidney disease: Secondary | ICD-10-CM | POA: Diagnosis not present

## 2023-05-04 LAB — GLUCOSE, CAPILLARY
Glucose-Capillary: 254 mg/dL — ABNORMAL HIGH (ref 70–99)
Glucose-Capillary: 228 mg/dL — ABNORMAL HIGH (ref 70–99)

## 2023-05-04 NOTE — Progress Notes (Addendum)
SURESH, MITCH ([id number]) [id number] Page 1 of 2 Visit Report for 05/04/2023 Arrival Information Details Patient Name: Date of Service: [name] 05/04/2023 7:30 A M Medical Record Number: [id number] Patient Account Number: [id number] Date of Birth/Sex: Treating RN: Jun 17, 1951 (72 y.o. [name] Primary Care Clarnce Homan: [name] Other Clinician: [name] Referring Reyonna Haack: Treating Saundra Gin/Extender: [name] in Treatment: 9 Visit Information History Since Last Visit All ordered tests and consults were completed: Yes Patient Arrived: Ambulatory Added or deleted any medications: No Arrival Time: 07:26 Any new allergies or adverse reactions: No Accompanied By: None Had a fall or experienced change in No Transfer Assistance: None activities of daily living that may affect Patient Identification Verified: Yes risk of falls: Secondary Verification Process Completed: Yes Signs or symptoms of abuse/neglect since last visito No Patient Requires Transmission-Based Precautions: No Hospitalized since last visit: No Patient Has Alerts: Yes Implantable device outside of the clinic excluding No Patient Alerts: Patient on Blood Thinner cellular tissue based products placed in the center Plavix since last visit: ABI R Underwood Pain Present Now: No Electronic Signature(s) Signed: 05/04/2023 1:47:36 PM By: [name] EMT Entered By: [name] on 05/04/2023 13:47:36 -------------------------------------------------------------------------------- Encounter Discharge Information Details Patient Name: Date of Service: [name] [name] 05/04/2023 7:30 A M Medical Record Number: [id number] Patient Account Number: [id number] Date of Birth/Sex: Treating RN: Dec 10, 1950 (72 y.o. [name] Primary Care Candas Deemer: [name] Other Clinician: [name] Referring Shalona Harbour: Treating Kianah Harries/Extender:  [name] in Treatment: 9 Encounter Discharge Information Items Discharge Condition: Stable Ambulatory Status: Ambulatory Discharge Destination: Home Transportation: Private Auto Accompanied By: None Schedule Follow-up Appointment: Yes Clinical Summary of Care: Electronic Signature(s) Signed: 05/04/2023 2:41:20 PM By: [name] EMT Entered By: [name] on 05/04/2023 14:41:20 [name] ([id number]) [id number] Page 2 of 2 -------------------------------------------------------------------------------- Vitals Details Patient Name: Date of Service: [name] 05/04/2023 7:30 A M Medical Record Number: [id number] Patient Account Number: [id number] Date of Birth/Sex: Treating RN: 21-Apr-1951 (72 y.o. [name] Primary Care Lariya Kinzie: [name] Other Clinician: [name] Referring Paighton Godette: Treating Anton Cheramie/Extender: [name]: 9 Vital Signs Time Taken: 07:39 Temperature (F): 97.3 Height (in): 67 Pulse (bpm): 68 Weight (lbs): 154 Respiratory Rate (breaths/min): 18 Body Mass Index (BMI): 24.1 Blood Pressure (mmHg): 152/83 Capillary Blood Glucose (mg/dl): 627 Reference Range: 80 - 120 mg / dl Electronic Signature(s) Signed: 05/04/2023 1:48:14 PM By: [name] EMT Entered By: [name] on 05/04/2023 13:48:14

## 2023-05-04 NOTE — Progress Notes (Addendum)
[place], [name] ([id number]) [id number] Page 1 of 2 Visit Report for 05/04/2023 HBO Details Patient Name: Date of Service: [name] 05/04/2023 7:30 A M Medical Record Number: [id number] Patient Account Number: [id number] Date of Birth/Sex: Treating RN: 01/07/1951 (72 y.o. [name] Primary Care Theus Espin: [name] Other Clinician: [name] Referring Mcguire Gasparyan: Treating Payden Bonus/Extender: [name] in Treatment: 9 HBO Treatment Course Details Treatment Course Number: 1 Ordering Temara Lanum: [name] T Treatments Ordered: otal 40 HBO Treatment Start Date: 04/06/2023 HBO Indication: Diabetic Ulcer(s) of the Lower Extremity Standard/Conservative Wound Care tried and failed greater than or equal to 30 days Wound #8 Right T Great oe HBO Treatment Details Treatment Number: 15 Patient Type: Outpatient Chamber Type: Monoplace Chamber Serial #: [id number] Treatment Protocol: 2.5 ATA with 90 minutes oxygen, with two 5 minute air breaks Treatment Details Compression Rate Down: 2.0 psi / minute De-Compression Rate Up: 2.0 psi / minute A breaks and breathing ir Compress Tx Pressure periods Decompress Decompress Begins Reached (leave unused spaces Begins Ends blank) Chamber Pressure (ATA 1 2.5 2.5 2.5 2.5 2.5 - - 2.5 1 ) Clock Time (24 hr) 08:01 08:16 08:46 08:51 09:21 09:26 - - 09:56 10:05 Treatment Length: 124 (minutes) Treatment Segments: 4 Vital Signs Capillary Blood Glucose Reference Range: 80 - 120 mg / dl HBO Diabetic Blood Glucose Intervention Range: <131 mg/dl or >564 mg/dl Time Vitals Blood Respiratory Capillary Blood Glucose Pulse Action Type: Pulse: Temperature: Taken: Pressure: Rate: Glucose (mg/dl): Meter #: Oximetry (%) Taken: Pre 07:39 152/83 68 18 97.3 254 Post 10:15 148/80 74 18 97.4 228 Treatment Response Treatment Toleration: Well Treatment Completion Status: Treatment Completed  without Adverse Event Treatment Notes The was given 8 oz of Glucerna before treatment per orders. Additional Procedure Documentation Tissue Sevierity: Necrosis of bone Physician HBO Attestation: I certify that I supervised this HBO treatment in accordance with Medicare guidelines. A trained emergency response team is readily available per Yes hospital policies and procedures. Continue HBOT as ordered. Yes Electronic Signature(s) Signed: 05/04/2023 3:03:09 PM By: [name] MD FACS Previous Signature: 05/04/2023 2:40:18 PM Version By: [name] EMT Entered By: [name] on 05/04/2023 15:03:09 Gamino, [name] ([id number]) [id number] Page 2 of 2 -------------------------------------------------------------------------------- HBO Safety Checklist Details Patient Name: Date of Service: [name] 05/04/2023 7:30 A M Medical Record Number: [id number] Patient Account Number: [id number] Date of Birth/Sex: Treating RN: 03-30-1951 (72 y.o. [name], [name] Primary Care Anessia Oakland: [name] Other Clinician: [name] Referring Jenesis Suchy: Treating Ersel Enslin/Extender: [name]: 9 HBO Safety Checklist Items Safety Checklist Consent Form Signed Patient voided / foley secured and emptied When did you last eato 0615 Last dose of injectable or oral agent Last night Ostomy pouch emptied and vented if applicable NA All implantable devices assessed, documented and approved NA Intravenous access site secured and place NA Valuables secured Linens and cotton and cotton/polyester blend (less than 51% polyester) Personal oil-based products / skin lotions / body lotions removed Wigs or hairpieces removed Smoking or tobacco materials removed Books / newspapers / magazines / loose paper removed Cologne, aftershave, perfume and deodorant removed Jewelry removed (may wrap wedding band) Make-up  removed NA Hair care products removed Battery operated devices (external) removed Heating patches and chemical warmers removed Titanium eyewear removed NA Nail polish cured greater than 10 hours NA Casting material cured greater than 10 hours NA Hearing aids removed NA Loose dentures or partials removed removed by patient Prosthetics have been  removed NA Patient demonstrates correct use of air break device (if applicable) Patient concerns have been addressed Patient grounding bracelet on and cord attached to chamber Specifics for Inpatients (complete in addition to above) Medication sheet sent with patient NA Intravenous medications needed or due during therapy sent with patient NA Drainage tubes (e.g. nasogastric tube or chest tube secured and vented) NA Endotracheal or Tracheotomy tube secured NA Cuff deflated of air and inflated with saline NA Airway suctioned NA Notes The safety checklist was done before the treatment was started. Electronic Signature(s) Signed: 05/04/2023 1:52:22 PM By: [name] EMT Entered By: [name] on 05/04/2023 13:52:21

## 2023-05-04 NOTE — Progress Notes (Signed)
KAHLID, SAVELY ([id number]) [id number] Page 1 of 2 Visit Report for 05/04/2023 Problem List Details Patient Name: Date of Service: [name] 05/04/2023 7:30 A M Medical Record Number: [id number] Patient Account Number: [id number] Date of Birth/Sex: Treating RN: 03-Oct-1951 (72 y.o. 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 No Yes necrosis of bone M86.171 Other acute osteomyelitis, right ankle and foot 03/02/2023 No Yes E11.621 Type 2 diabetes mellitus with foot ulcer 03/02/2023 No Yes I10 Essential (primary) hypertension 03/02/2023 No Yes N18.32 Chronic kidney disease, stage 3b 03/02/2023 No Yes Inactive Problems Resolved Problems ICD-10 Code Description Active Date Resolved Date L97.512 Non-pressure chronic ulcer of other part of right foot with fat layer 03/09/2023 03/09/2023 exposed Electronic Signature(s) Signed: 05/04/2023 2:40:46 PM By: [name] EMT Signed: 05/04/2023 3:02:25 PM By: [name] MD FACS Entered By: [name] on 05/04/2023 14:40:46 -------------------------------------------------------------------------------- SuperBill Details Patient Name: Date of Service: [name], [name] 05/04/2023 Medical Record Number: [id number] Patient Account Number: [id number] Date of Birth/Sex: Treating RN: 16-Jan-1951 (72 y.o. [name] [place], [place] ([id number]) [phone].pdf Page 2 of 2 Primary Care Provider: [name] Other Clinician: [name] Referring Provider: Treating Provider/Extender: [name] in Treatment: 9 Diagnosis Coding ICD-10 Codes Code  Description [phone] Non-pressure chronic ulcer of other part of right foot with necrosis of bone M86.171 Other acute osteomyelitis, right ankle and foot E11.621 Type 2 diabetes mellitus with foot ulcer I10 Essential (primary) hypertension N18.32 Chronic kidney disease, stage 3b Facility Procedures : CPT4 Code Description: [id number] G0277-(Facility Use Only) HBOT full body chamber, , ICD-10 Diagnosis Description E11.621 Type 2 diabetes mellitus with foot ulcer L97.514 Non-pressure chronic ulcer of other part of right foot wi M86.171 Other acute  osteomyelitis, right ankle and foot Modifier: th necrosis o Quantity: 4 f bone Physician Procedures : CPT4 Code Description Modifier [id number] 99183 - WC PHYS HYPERBARIC OXYGEN THERAPY ICD-10 Diagnosis Description E11.621 Type 2 diabetes mellitus with foot ulcer L97.514 Non-pressure chronic ulcer of other part of right foot with necrosis o M86.171 Other  acute osteomyelitis, right ankle and foot Quantity: 1 f bone Electronic Signature(s) Signed: 05/04/2023 2:40:41 PM By: [name] EMT Signed: 05/04/2023 3:02:25 PM By: [name] MD FACS Entered By: [name] on 05/04/2023 14:40:41

## 2023-05-05 ENCOUNTER — Encounter (HOSPITAL_BASED_OUTPATIENT_CLINIC_OR_DEPARTMENT_OTHER): Payer: Medicare HMO | Admitting: General Surgery

## 2023-05-05 DIAGNOSIS — N1832 Chronic kidney disease, stage 3b: Secondary | ICD-10-CM | POA: Diagnosis not present

## 2023-05-05 DIAGNOSIS — L97514 Non-pressure chronic ulcer of other part of right foot with necrosis of bone: Secondary | ICD-10-CM | POA: Diagnosis not present

## 2023-05-05 DIAGNOSIS — I129 Hypertensive chronic kidney disease with stage 1 through stage 4 chronic kidney disease, or unspecified chronic kidney disease: Secondary | ICD-10-CM | POA: Diagnosis not present

## 2023-05-05 DIAGNOSIS — M86171 Other acute osteomyelitis, right ankle and foot: Secondary | ICD-10-CM | POA: Diagnosis not present

## 2023-05-05 DIAGNOSIS — E1122 Type 2 diabetes mellitus with diabetic chronic kidney disease: Secondary | ICD-10-CM | POA: Diagnosis not present

## 2023-05-05 DIAGNOSIS — E11621 Type 2 diabetes mellitus with foot ulcer: Secondary | ICD-10-CM | POA: Diagnosis not present

## 2023-05-05 LAB — GLUCOSE, CAPILLARY
Glucose-Capillary: 207 mg/dL — ABNORMAL HIGH (ref 70–99)
Glucose-Capillary: 266 mg/dL — ABNORMAL HIGH (ref 70–99)

## 2023-05-05 NOTE — Progress Notes (Signed)
ALEC, KAROW ([id number]) [id number] Page 1 of 2 Visit Report for 05/05/2023 Problem List Details Patient Name: Date of Service: [name] 05/05/2023 7:30 [name] M Medical Record Number: [id number] Patient Account Number: [id number] Date of Birth/Sex: Treating RN: 05-27-1951 (72 y.o. 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 No Yes necrosis of bone M86.171 Other acute osteomyelitis, right ankle and foot 03/02/2023 No Yes E11.621 Type 2 diabetes mellitus with foot ulcer 03/02/2023 No Yes I10 Essential (primary) hypertension 03/02/2023 No Yes N18.32 Chronic kidney disease, stage 3b 03/02/2023 No Yes Inactive Problems Resolved Problems ICD-10 Code Description Active Date Resolved Date L97.512 Non-pressure chronic ulcer of other part of right foot with fat layer 03/09/2023 03/09/2023 exposed Electronic Signature(s) Signed: 05/05/2023 2:03:09 PM By: [name] EMT Signed: 05/05/2023 3:40:30 PM By: [name] MD FACS Entered By: [name] on 05/05/2023 14:03:09 -------------------------------------------------------------------------------- SuperBill Details Patient Name: Date of Service: [name], [name] 05/05/2023 Medical Record Number: [id number] Patient Account Number: [id number] Date of Birth/Sex: Treating RN: [birth year] (72 y.o. [name], [name] ([id number]) [id number] Page 2 of 2 Primary Care Provider: [name] Other Clinician: [name] Referring Provider: Treating Provider/Extender: [name] in Treatment: 9 Diagnosis Coding ICD-10 Codes Code  Description [phone] Non-pressure chronic ulcer of other part of right foot with necrosis of bone M86.171 Other acute osteomyelitis, right ankle and foot E11.621 Type 2 diabetes mellitus with foot ulcer I10 Essential (primary) hypertension N18.32 Chronic kidney disease, stage 3b Facility Procedures : CPT4 Code Description: [id number] G0277-(Facility Use Only) HBOT full body chamber, , ICD-10 Diagnosis Description E11.621 Type 2 diabetes mellitus with foot ulcer L97.514 Non-pressure chronic ulcer of other part of right foot wi M86.171 Other acute  osteomyelitis, right ankle and foot Modifier: th necrosis o Quantity: 4 f bone Physician Procedures : CPT4 Code Description Modifier [id number] 99183 - WC PHYS HYPERBARIC OXYGEN THERAPY ICD-10 Diagnosis Description E11.621 Type 2 diabetes mellitus with foot ulcer L97.514 Non-pressure chronic ulcer of other part of right foot with necrosis o M86.171 Other  acute osteomyelitis, right ankle and foot Quantity: 1 f bone Electronic Signature(s) Signed: 05/05/2023 2:03:03 PM By: [name] EMT Signed: 05/05/2023 3:40:30 PM By: [name] MD FACS Entered By: [name] on 05/05/2023 14:03:02

## 2023-05-05 NOTE — Progress Notes (Signed)
CLAUD, ISHEE ([id number]) [id number] Page 1 of 2 Visit Report for 05/05/2023 Arrival Information Details Patient Name: Date of Service: [name] 05/05/2023 7:30 [name] M Medical Record Number: [id number] Patient Account Number: [id number] Date of Birth/Sex: Treating RN: 06/04/1951 (72 y.o. [name], [name] Primary Care Esli Clements: [name] Other Clinician: [name] Referring Camiah Humm: Treating Rockne Dearinger/Extender: [name] in Treatment: 9 Visit Information History Since Last Visit All ordered tests and consults were completed: Yes Patient Arrived: Ambulatory Added or deleted any medications: No Arrival Time: 07:45 Any new allergies or adverse reactions: No Accompanied By: None Had [name] fall or experienced change in No Transfer Assistance: None activities of daily living that may affect Patient Requires Transmission-Based Precautions: No risk of falls: Patient Has Alerts: Yes Signs or symptoms of abuse/neglect since last visito No Patient Alerts: Patient on Blood Thinner Hospitalized since last visit: No Plavix Implantable device outside of the clinic excluding No ABI R [facility] cellular tissue based products placed in the center since last visit: Pain Present Now: No Electronic Signature(s) Signed: 05/05/2023 1:58:17 PM By: [name] EMT Entered By: [name] on 05/05/2023 13:58:16 -------------------------------------------------------------------------------- Encounter Discharge Information Details Patient Name: Date of Service: [name], [name] 05/05/2023 7:30 [name] M Medical Record Number: [id number] Patient Account Number: [id number] Date of Birth/Sex: Treating RN: 02/22/1951 (72 y.o. [name], [name] Primary Care Ezme Duch: [name] Other Clinician: [name] Referring Sadiya Durand: Treating Kareli Hossain/Extender: [name] in Treatment: 9 Encounter Discharge Information  Items Discharge Condition: Stable Ambulatory Status: Ambulatory Discharge Destination: Home Transportation: Private Auto Accompanied By: None Schedule Follow-up Appointment: Yes Clinical Summary of Care: Electronic Signature(s) Signed: 05/05/2023 2:03:47 PM By: [name] EMT Entered By: [name] on 05/05/2023 14:03:47 Batts, [name] ([id number]) [id number] Page 2 of 2 -------------------------------------------------------------------------------- Vitals Details Patient Name: Date of Service: [name] 05/05/2023 7:30 [name] M Medical Record Number: [id number] Patient Account Number: [id number] Date of Birth/Sex: Treating RN: Mar 18, 1951 (72 y.o. [name], [name] Primary Care Istvan Behar: [name] Other Clinician: [name] Referring Avriel Kandel: Treating Mykala Mccready/Extender: [name]: 9 Vital Signs Time Taken: 07:47 Temperature (F): 97.9 Height (in): 67 Pulse (bpm): 67 Weight (lbs): 154 Respiratory Rate (breaths/min): 18 Body Mass Index (BMI): 24.1 Blood Pressure (mmHg): 145/77 Capillary Blood Glucose (mg/dl): 109 Reference Range: 80 - 120 mg / dl Electronic Signature(s) Signed: 05/05/2023 1:59:01 PM By: [name] EMT Entered By: [name] on 05/05/2023 13:59:00

## 2023-05-05 NOTE — Progress Notes (Addendum)
Norlina, [name] ([id number]) [id number] Page 1 of 2 Visit Report for 05/05/2023 HBO Details Patient Name: Date of Service: [name] 05/05/2023 7:30 A M Medical Record Number: [id number] Patient Account Number: [id number] Date of Birth/Sex: Treating RN: 20-Sep-1951 (72 y.o. [name], [name] Primary Care Louann Hopson: [name] Other Clinician: [name] Referring Malakie Balis: Treating Kashon Kraynak/Extender: [name] in Treatment: 9 HBO Treatment Course Details Treatment Course Number: 1 Ordering Christiona Siddique: [name] T Treatments Ordered: otal 40 HBO Treatment Start Date: 04/06/2023 HBO Indication: Diabetic Ulcer(s) of the Lower Extremity Standard/Conservative Wound Care tried and failed greater than or equal to 30 days Wound #8 Right T Great oe HBO Treatment Details Treatment Number: 16 Patient Type: Outpatient Chamber Type: Monoplace Chamber Serial #: [id number] Treatment Protocol: 2.5 ATA with 90 minutes oxygen, with two 5 minute air breaks Treatment Details Compression Rate Down: 2.0 psi / minute De-Compression Rate Up: 2.0 psi / minute A breaks and breathing ir Compress Tx Pressure periods Decompress Decompress Begins Reached (leave unused spaces Begins Ends blank) Chamber Pressure (ATA 1 2.5 2.5 2.5 2.5 2.5 - - 2.5 1 ) Clock Time (24 hr) 07:53 08:09 08:39 08:44 09:14 09:19 - - 09:49 09:59 Treatment Length: 126 (minutes) Treatment Segments: 4 Vital Signs Capillary Blood Glucose Reference Range: 80 - 120 mg / dl HBO Diabetic Blood Glucose Intervention Range: <131 mg/dl or >324 mg/dl Time Vitals Blood Respiratory Capillary Blood Glucose Pulse Action Type: Pulse: Temperature: Taken: Pressure: Rate: Glucose (mg/dl): Meter #: Oximetry (%) Taken: Pre 07:47 145/77 67 18 97.9 207 Post 10:05 149/75 73 18 97.9 266 Treatment Response Treatment Toleration: Well Treatment Completion Status: Treatment Completed  without Adverse Event Treatment Notes The patient was given 8 oz of Glucerna to drink before treatment per orders. Additional Procedure Documentation Tissue Sevierity: Necrosis of bone Physician HBO Attestation: I certify that I supervised this HBO treatment in accordance with Medicare guidelines. A trained emergency response team is readily available per Yes hospital policies and procedures. Continue HBOT as ordered. Yes Electronic Signature(s) Signed: 05/05/2023 3:41:23 PM By: [name] MD FACS Previous Signature: 05/05/2023 2:18:05 PM Version By: [name] EMT Previous Signature: 05/05/2023 2:02:39 PM Version By: [name] EMT Entered By: [name] on 05/05/2023 15:41:23 [name], [name] [id number] Page 2 of 2 -------------------------------------------------------------------------------- HBO Safety Checklist Details Patient Name: Date of Service: [name] 05/05/2023 7:30 A M Medical Record Number: [id number] Patient Account Number: [id number] Date of Birth/Sex: Treating RN: 05-May-1951 (72 y.o. [name], [name] Primary Care Syvilla Martin: [name] Other Clinician: [name] Referring Draylen Lobue: Treating Dartanian Knaggs/Extender: [name]: 9 HBO Safety Checklist Items Safety Checklist Consent Form Signed Patient voided / foley secured and emptied When did you last eato 0615 Last dose of injectable or oral agent Yesterday Ostomy pouch emptied and vented if applicable NA All implantable devices assessed, documented and approved NA Intravenous access site secured and place NA Valuables secured Linens and cotton and cotton/polyester blend (less than 51% polyester) Personal oil-based products / skin lotions / body lotions removed Wigs or hairpieces removed NA Smoking or tobacco materials removed Books / newspapers / magazines / loose paper removed Cologne, aftershave,  perfume and deodorant removed Jewelry removed (may wrap wedding band) Make-up removed NA Hair care products removed Battery operated devices (external) removed Heating patches and chemical warmers removed Titanium eyewear removed NA Nail polish cured greater than 10 hours NA Casting material cured greater than 10 hours NA Hearing aids  removed NA Loose dentures or partials removed removed by patient Prosthetics have been removed NA Patient demonstrates correct use of air break device (if applicable) Patient concerns have been addressed Patient grounding bracelet on and cord attached to chamber Specifics for Inpatients (complete in addition to above) Medication sheet sent with patient NA Intravenous medications needed or due during therapy sent with patient NA Drainage tubes (e.g. nasogastric tube or chest tube secured and vented) NA Endotracheal or Tracheotomy tube secured NA Cuff deflated of air and inflated with saline NA Airway suctioned NA Notes The safety checklist was done before the treatment was started. Electronic Signature(s) Signed: 05/05/2023 2:00:07 PM By: [name] EMT Entered By: [name] on 05/05/2023 14:00:07

## 2023-05-06 ENCOUNTER — Encounter (HOSPITAL_BASED_OUTPATIENT_CLINIC_OR_DEPARTMENT_OTHER): Payer: Medicare HMO | Admitting: General Surgery

## 2023-05-06 ENCOUNTER — Other Ambulatory Visit: Payer: Self-pay | Admitting: Family Medicine

## 2023-05-06 DIAGNOSIS — L97514 Non-pressure chronic ulcer of other part of right foot with necrosis of bone: Secondary | ICD-10-CM | POA: Diagnosis not present

## 2023-05-06 DIAGNOSIS — N1832 Chronic kidney disease, stage 3b: Secondary | ICD-10-CM | POA: Diagnosis not present

## 2023-05-06 DIAGNOSIS — E1122 Type 2 diabetes mellitus with diabetic chronic kidney disease: Secondary | ICD-10-CM | POA: Diagnosis not present

## 2023-05-06 DIAGNOSIS — E11621 Type 2 diabetes mellitus with foot ulcer: Secondary | ICD-10-CM | POA: Diagnosis not present

## 2023-05-06 DIAGNOSIS — M86171 Other acute osteomyelitis, right ankle and foot: Secondary | ICD-10-CM | POA: Diagnosis not present

## 2023-05-06 DIAGNOSIS — I129 Hypertensive chronic kidney disease with stage 1 through stage 4 chronic kidney disease, or unspecified chronic kidney disease: Secondary | ICD-10-CM | POA: Diagnosis not present

## 2023-05-06 LAB — GLUCOSE, CAPILLARY
Glucose-Capillary: 249 mg/dL — ABNORMAL HIGH (ref 70–99)
Glucose-Capillary: 238 mg/dL — ABNORMAL HIGH (ref 70–99)
Glucose-Capillary: 120 mg/dL — ABNORMAL HIGH (ref 70–99)
Glucose-Capillary: 121 mg/dL — ABNORMAL HIGH (ref 70–99)

## 2023-05-06 NOTE — Progress Notes (Signed)
ABDIAS, HICKAM ([id number]) [id number] Page 1 of 2 Visit Report for 05/06/2023 Problem List Details Patient Name: Date of Service: [name] 05/06/2023 7:30 A M Medical Record Number: [id number] Patient Account Number: [id number] Date of Birth/Sex: Treating RN: [birth year] (72 y.o. 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 M86.171 Other acute osteomyelitis, right ankle and foot 03/02/2023 No Yes E11.621 Type 2 diabetes mellitus with foot ulcer 03/02/2023 No Yes I10 Essential (primary) hypertension 03/02/2023 No Yes N18.32 Chronic kidney disease, stage 3b 03/02/2023 No Yes Inactive Problems Resolved Problems ICD-10 Code Description Active Date Resolved Date L97.512 Non-pressure chronic ulcer of other part of right foot with fat layer 03/09/2023 03/09/2023 exposed Electronic Signature(s) Signed: 05/06/2023 2:39:23 PM By: [name] EMT Signed: 05/06/2023 3:01:56 PM By: [name] MD FACS Entered By: [name] on 05/06/2023 14:39:23 -------------------------------------------------------------------------------- SuperBill Details Patient Name: Date of Service: [name], [name] 05/06/2023 Medical Record Number: [id number] Patient Account Number: [id number] Date of Birth/Sex: Treating RN: Mar 06, 1951 (72 y.o. [name] [place], [place] ([id number]) [phone].pdf Page 2 of 2 Primary Care Provider: [name] Other Clinician: [name] Referring Provider: Treating Provider/Extender: [name] in Treatment: 9 Diagnosis Coding ICD-10 Codes Code  Description [phone] Non-pressure chronic ulcer of other part of right foot with necrosis of bone M86.171 Other acute osteomyelitis, right ankle and foot E11.621 Type 2 diabetes mellitus with foot ulcer I10 Essential (primary) hypertension N18.32 Chronic kidney disease, stage 3b Facility Procedures : CPT4 Code Description: [id number]-(Facility Use Only) HBOT full body chamber, , ICD-10 Diagnosis Description E11.621 Type 2 diabetes mellitus with foot ulcer L97.514 Non-pressure chronic ulcer of other part of right foot wi M86.171 Other acute  osteomyelitis, right ankle and foot Modifier: th necrosis o Quantity: 4 f bone Physician Procedures : CPT4 Code Description Modifier [id number] - WC PHYS HYPERBARIC OXYGEN THERAPY ICD-10 Diagnosis Description E11.621 Type 2 diabetes mellitus with foot ulcer L97.514 Non-pressure chronic ulcer of other part of right foot with necrosis o M86.171 Other  acute osteomyelitis, right ankle and foot Quantity: 1 f bone Electronic Signature(s) Signed: 05/06/2023 2:39:04 PM By: [name] EMT Signed: 05/06/2023 3:01:56 PM By: [name] MD FACS Entered By: [name] on 05/06/2023 14:39:04

## 2023-05-06 NOTE — Progress Notes (Signed)
[name], [name] ([id number]) [id number] Page 1 of 2 Visit Report for 05/06/2023 HBO Details Patient Name: Date of Service: [name] 05/06/2023 7:30 A M Medical Record Number: [id number] Patient Account Number: [id number] Date of Birth/Sex: Treating RN: [birth year] (72 y.o. [name] Primary Care Maylon Sailors: [name] Other Clinician: [name] Referring Razan Siler: Treating Firmin Belisle/Extender: [name] in Treatment: 9 HBO Treatment Course Details Treatment Course Number: 1 Ordering Keirra Zeimet: [name] T Treatments Ordered: otal 40 HBO Treatment Start Date: 04/06/2023 HBO Indication: Diabetic Ulcer(s) of the Lower Extremity Standard/Conservative Wound Care tried and failed greater than or equal to 30 days Wound #8 Right T Great oe HBO Treatment Details Treatment Number: 17 Patient Type: Outpatient Chamber Type: Monoplace Chamber Serial #: [id number] Treatment Protocol: 2.5 ATA with 90 minutes oxygen, with two 5 minute air breaks Treatment Details Compression Rate Down: 2.0 psi / minute De-Compression Rate Up: 2.0 psi / minute A breaks and breathing ir Compress Tx Pressure periods Decompress Decompress Begins Reached (leave unused spaces Begins Ends blank) Chamber Pressure (ATA 1 2.5 2.5 2.5 2.5 2.5 - - 2.5 1 ) Clock Time (24 hr) 07:50 08:04 08:34 08:39 09:09 09:14 - - 09:44 09:54 Treatment Length: 124 (minutes) Treatment Segments: 4 Vital Signs Capillary Blood Glucose Reference Range: 80 - 120 mg / dl HBO Diabetic Blood Glucose Intervention Range: <131 mg/dl or >324 mg/dl Time Vitals Blood Respiratory Capillary Blood Glucose Pulse Action Type: Pulse: Temperature: Taken: Pressure: Rate: Glucose (mg/dl): Meter #: Oximetry (%) Taken: Pre 07:37 140/79 67 20 98 249 Post 09:56 171/84 79 18 98.1 238 Treatment Response Treatment Toleration: Well Treatment Completion Status: Treatment  Completed without Adverse Event Treatment Notes The patient was given 8 oz of Glucerna to drink before treatment per orders. Additional Procedure Documentation Tissue Sevierity: Necrosis of bone Physician HBO Attestation: I certify that I supervised this HBO treatment in accordance with Medicare guidelines. A trained emergency response team is readily available per Yes hospital policies and procedures. Continue HBOT as ordered. Yes Electronic Signature(s) Signed: 05/06/2023 4:08:07 PM By: [name] MD FACS Previous Signature: 05/06/2023 2:38:40 PM Version By: [name] EMT Entered By: [name] on 05/06/2023 16:08:07 [name], [name] ([id number]) [id number] Page 2 of 2 -------------------------------------------------------------------------------- HBO Safety Checklist Details Patient Name: Date of Service: [name] 05/06/2023 7:30 A M Medical Record Number: [id number] Patient Account Number: [id number] Date of Birth/Sex: Treating RN: [birth year] (72 y.o. [name] Primary Care Zeev Deakins: [name] Other Clinician: [name] Referring Bali Lyn: Treating Blair Lundeen/Extender: [name] in Treatment: 9 HBO Safety Checklist Items Safety Checklist Consent Form Signed Patient voided / foley secured and emptied When did you last eato 0600 Last dose of injectable or oral agent Last night Ostomy pouch emptied and vented if applicable NA All implantable devices assessed, documented and approved NA Intravenous access site secured and place NA Valuables secured Linens and cotton and cotton/polyester blend (less than 51% polyester) Personal oil-based products / skin lotions / body lotions removed Wigs or hairpieces removed NA Smoking or tobacco materials removed Books / newspapers / magazines / loose paper removed Cologne, aftershave, perfume and deodorant removed Jewelry removed (may wrap  wedding band) Make-up removed NA Hair care products removed Battery operated devices (external) removed Heating patches and chemical warmers removed Titanium eyewear removed NA Nail polish cured greater than 10 hours NA Casting material cured greater than 10 hours NA Hearing aids removed NA Loose dentures or partials removed removed by  patient Prosthetics have been removed NA Patient demonstrates correct use of air break device (if applicable) Patient concerns have been addressed Patient grounding bracelet on and cord attached to chamber Specifics for Inpatients (complete in addition to above) Medication sheet sent with patient NA Intravenous medications needed or due during therapy sent with patient NA Drainage tubes (e.g. nasogastric tube or chest tube secured and vented) NA Endotracheal or Tracheotomy tube secured NA Cuff deflated of air and inflated with saline NA Airway suctioned NA Electronic Signature(s) Signed: 05/06/2023 2:36:53 PM By: [name] EMT Entered By: [name] on 05/06/2023 14:36:53

## 2023-05-06 NOTE — Progress Notes (Signed)
[name] ([id number]) [id number] Page 1 of 2 Visit Report for 05/06/2023 Arrival Information Details Patient Name: Date of Service: [name] 05/06/2023 7:30 A M Medical Record Number: [id number] Patient Account Number: [id number] Date of Birth/Sex: Treating RN: [birth year] (72 y.o. [name] Primary Care [name]: [name] Other Clinician: [name] Referring Thien Berka: Treating Darra Rosa/Extender: [name] in Treatment: 9 Visit Information History Since Last Visit All ordered tests and consults were completed: Yes Patient Arrived: Ambulatory Added or deleted any medications: No Arrival Time: 14:31 Any new allergies or adverse reactions: No Accompanied By: None Had a fall or experienced change in No Transfer Assistance: None activities of daily living that may affect Patient Identification Verified: Yes risk of falls: Secondary Verification Process Completed: Yes Signs or symptoms of abuse/neglect since last visito No Patient Requires Transmission-Based Precautions: No Hospitalized since last visit: No Patient Has Alerts: Yes Implantable device outside of the clinic excluding No Patient Alerts: Patient on Blood Thinner cellular tissue based products placed in the center Plavix since last visit: ABI [place] Pain Present Now: No Electronic Signature(s) Signed: 05/06/2023 2:33:07 PM By: [name] EMT Entered By: [name] on 05/06/2023 14:33:06 -------------------------------------------------------------------------------- Encounter Discharge Information Details Patient Name: Date of Service: [name] RD, [name] 05/06/2023 7:30 A M Medical Record Number: [id number] Patient Account Number: [id number] Date of Birth/Sex: Treating RN: 07/28/1951 (72 y.o. [name] Primary Care [name]: [name] Other Clinician: [name] Referring [name]: Treating  [name]/Extender: [name] in Treatment: 9 Encounter Discharge Information Items Discharge Condition: Stable Ambulatory Status: Ambulatory Discharge Destination: Home Transportation: Private Auto Accompanied By: None Schedule Follow-up Appointment: Yes Clinical Summary of Care: Electronic Signature(s) Signed: 05/06/2023 2:40:00 PM By: [name] EMT Entered By: [name] on 05/06/2023 14:40:00 [name] ([id number]) [id number] Page 2 of 2 -------------------------------------------------------------------------------- Vitals Details Patient Name: Date of Service: [name] 05/06/2023 7:30 A M Medical Record Number: [id number] Patient Account Number: [id number] Date of Birth/Sex: Treating RN: 08-05-1951 (72 y.o. [name] Primary Care [name]: [name] Other Clinician: [name] Referring Alecxis Baltzell: Treating [name]/Extender: [name] Weeks in Treatment: 9 Vital Signs Time Taken: 07:37 Temperature (F): 98.0 Height (in): 67 Pulse (bpm): 67 Weight (lbs): 154 Respiratory Rate (breaths/min): 20 Body Mass Index (BMI): 24.1 Blood Pressure (mmHg): 140/79 Capillary Blood Glucose (mg/dl): 433 Reference Range: 80 - 120 mg / dl Electronic Signature(s) Signed: 05/06/2023 2:35:54 PM By: [name] EMT Entered By: [name] on 05/06/2023 14:35:54

## 2023-05-07 ENCOUNTER — Encounter (HOSPITAL_BASED_OUTPATIENT_CLINIC_OR_DEPARTMENT_OTHER): Payer: Medicare HMO | Admitting: General Surgery

## 2023-05-07 ENCOUNTER — Telehealth: Payer: Self-pay | Admitting: Family Medicine

## 2023-05-07 DIAGNOSIS — I129 Hypertensive chronic kidney disease with stage 1 through stage 4 chronic kidney disease, or unspecified chronic kidney disease: Secondary | ICD-10-CM | POA: Diagnosis not present

## 2023-05-07 DIAGNOSIS — M86171 Other acute osteomyelitis, right ankle and foot: Secondary | ICD-10-CM | POA: Diagnosis not present

## 2023-05-07 DIAGNOSIS — L97514 Non-pressure chronic ulcer of other part of right foot with necrosis of bone: Secondary | ICD-10-CM | POA: Diagnosis not present

## 2023-05-07 DIAGNOSIS — L97512 Non-pressure chronic ulcer of other part of right foot with fat layer exposed: Secondary | ICD-10-CM | POA: Diagnosis not present

## 2023-05-07 DIAGNOSIS — E1122 Type 2 diabetes mellitus with diabetic chronic kidney disease: Secondary | ICD-10-CM | POA: Diagnosis not present

## 2023-05-07 DIAGNOSIS — E11621 Type 2 diabetes mellitus with foot ulcer: Secondary | ICD-10-CM | POA: Diagnosis not present

## 2023-05-07 DIAGNOSIS — N1832 Chronic kidney disease, stage 3b: Secondary | ICD-10-CM | POA: Diagnosis not present

## 2023-05-07 LAB — GLUCOSE, CAPILLARY
Glucose-Capillary: 223 mg/dL — ABNORMAL HIGH (ref 70–99)
Glucose-Capillary: 302 mg/dL — ABNORMAL HIGH (ref 70–99)

## 2023-05-07 NOTE — Progress Notes (Signed)
[name], [name] ([id number]) [id number] Page 1 of 2 Visit Report for 05/07/2023 HBO Details Patient Name: Date of Service: [name] 05/07/2023 7:30 A M Medical Record Number: [id number] Patient Account Number: [id number] Date of Birth/Sex: Treating RN: 07-27-1951 (72 y.o. [name] Primary Care [name]: [name] Other Clinician: Karl [name] Referring [name]: Treating [name]/Extender: [name] in Treatment: 9 HBO Treatment Course Details Treatment Course Number: 1 Ordering Abdimalik Mayorquin: [name] T Treatments Ordered: otal 40 HBO Treatment Start Date: 04/06/2023 HBO Indication: Diabetic Ulcer(s) of the Lower Extremity Standard/Conservative Wound Care tried and failed greater than or equal to 30 days Wound #8 Right T Great oe HBO Treatment Details Treatment Number: 18 Patient Type: Outpatient Chamber Type: Monoplace Chamber Serial #: [id number] Treatment Protocol: 2.5 ATA with 90 minutes oxygen, with two 5 minute air breaks Treatment Details Compression Rate Down: 2.0 psi / minute De-Compression Rate Up: 2.0 psi / minute A breaks and breathing ir Compress Tx Pressure periods Decompress Decompress Begins Reached (leave unused spaces Begins Ends blank) Chamber Pressure (ATA 1 2.5 2.5 2.5 2.5 2.5 - - 2.5 1 ) Clock Time (24 hr) 07:57 08:10 08:40 08:45 09:15 09:20 - - 09:50 10:02 Treatment Length: 125 (minutes) Treatment Segments: 4 Vital Signs Capillary Blood Glucose Reference Range: 80 - 120 mg / dl HBO Diabetic Blood Glucose Intervention Range: <131 mg/dl or >253 mg/dl Time Vitals Blood Respiratory Capillary Blood Glucose Pulse Action Type: Pulse: Temperature: Taken: Pressure: Rate: Glucose (mg/dl): Meter #: Oximetry (%) Taken: Pre 07:44 128/77 63 20 97.3 223 Post 10:07 155/82 79 18 98.4 302 Treatment Response Treatment Toleration: Well Treatment Completion Status: Treatment  Completed without Adverse Event Treatment Notes The patient was given 8 oz of Glucerna before the treatment was started per orders. Additional Procedure Documentation Tissue Sevierity: Necrosis of bone Physician HBO Attestation: I certify that I supervised this HBO treatment in accordance with Medicare guidelines. A trained emergency response team is readily available per Yes hospital policies and procedures. Continue HBOT as ordered. Yes Electronic Signature(s) Signed: 05/07/2023 2:38:04 PM By: [name] Guess MD FACS Previous Signature: 05/07/2023 2:08:18 PM Version By: [name] EMT Previous Signature: 05/07/2023 2:06:01 PM Version By: [name] EMT Entered By: [name] on 05/07/2023 14:38:03 Hartline, [name] ([id number]) [id number] Page 2 of 2 -------------------------------------------------------------------------------- HBO Safety Checklist Details Patient Name: Date of Service: [name] 05/07/2023 7:30 A M Medical Record Number: [id number] Patient Account Number: [id number] Date of Birth/Sex: Treating RN: 1951/08/02 (72 y.o. [name] Primary Care [name]: [name] Other Clinician: Karl [name] Referring [name]: Treating [name]/Extender: [name] Weeks in Treatment: 9 HBO Safety Checklist Items Safety Checklist Consent Form Signed Patient voided / foley secured and emptied When did you last eato 0600 Last dose of injectable or oral agent Yesterday Ostomy pouch emptied and vented if applicable NA All implantable devices assessed, documented and approved NA Intravenous access site secured and place NA Valuables secured Linens and cotton and cotton/polyester blend (less than 51% polyester) Personal oil-based products / skin lotions / body lotions removed Wigs or hairpieces removed NA Smoking or tobacco materials removed Books / newspapers / magazines / loose paper  removed Cologne, aftershave, perfume and deodorant removed Jewelry removed (may wrap wedding band) Make-up removed NA Hair care products removed Battery operated devices (external) removed Heating patches and chemical warmers removed Titanium eyewear removed NA Nail polish cured greater than 10 hours NA Casting material cured greater than 10 hours NA Hearing  aids removed NA Loose dentures or partials removed removed by patient Prosthetics have been removed NA Patient demonstrates correct use of air break device (if applicable) Patient concerns have been addressed Patient grounding bracelet on and cord attached to chamber Specifics for Inpatients (complete in addition to above) Medication sheet sent with patient NA Intravenous medications needed or due during therapy sent with patient NA Drainage tubes (e.g. nasogastric tube or chest tube secured and vented) NA Endotracheal or Tracheotomy tube secured NA Cuff deflated of air and inflated with saline NA Airway suctioned NA Notes The safety checklist was done before the treatment was started. Electronic Signature(s) Signed: 05/07/2023 2:03:43 PM By: [name] EMT Entered By: [name] on 05/07/2023 14:03:43

## 2023-05-07 NOTE — Telephone Encounter (Addendum)
Prescription Request  05/07/2023  LOV: 01/07/2023  Pt called back and is now asking for a refill of his insulin pens.  Have you contacted your pharmacy to request a refill? No   Which pharmacy would you like this sent to?   [id number] [place], [place] - [address]  [place] [address] Phone: [phone] Fax: [phone]

## 2023-05-07 NOTE — Progress Notes (Signed)
K[place]on, [name] ([id number]) [id number] Page 1 of 2 Visit Report for 05/07/2023 Arrival Information Details Patient Name: Date of Service: [name] 05/07/2023 7:30 [name] M Medical Record Number: [id number] Patient Account Number: [id number] Date of Birth/Sex: Treating RN: Feb 12, 1951 (72 y.o. [name] Primary Care Einar Nolasco: [name] Other Clinician: [name] Referring Makia Bossi: Treating Marvena Tally/Extender: [name] in Treatment: 9 Visit Information History Since Last Visit All ordered tests and consults were completed: Yes Patient Arrived: Ambulatory Added or deleted any medications: No Arrival Time: 07:35 Any new allergies or adverse reactions: No Accompanied By: none Had [name] fall or experienced change in No Transfer Assistance: None activities of daily living that may affect Patient Identification Verified: Yes risk of falls: Secondary Verification Process Completed: Yes Signs or symptoms of abuse/neglect since last visito No Patient Requires Transmission-Based Precautions: No Hospitalized since last visit: No Patient Has Alerts: Yes Implantable device outside of the clinic excluding No Patient Alerts: Patient on Blood Thinner cellular tissue based products placed in the center Plavix since last visit: ABI R [place] Pain Present Now: No Electronic Signature(s) Signed: 05/07/2023 2:01:46 PM By: [name] EMT Entered By: [name] on 05/07/2023 14:01:46 -------------------------------------------------------------------------------- Encounter Discharge Information Details Patient Name: Date of Service: [name], [name] 05/07/2023 7:30 [name] M Medical Record Number: [id number] Patient Account Number: [id number] Date of Birth/Sex: Treating RN: 10/12/1951 (72 y.o. [name] Primary Care Malania Gawthrop: [name] Other Clinician: [name] Referring Verleen Stuckey: Treating  Thorin Starner/Extender: [name] in Treatment: 9 Encounter Discharge Information Items Discharge Condition: Stable Ambulatory Status: Ambulatory Discharge Destination: Home Transportation: Private Auto Accompanied By: None Schedule Follow-up Appointment: Yes Clinical Summary of Care: Electronic Signature(s) Signed: 05/07/2023 2:07:09 PM By: [name] EMT Entered By: [name] on 05/07/2023 14:07:09 [name] ([id number]) [id number] Page 2 of 2 -------------------------------------------------------------------------------- Vitals Details Patient Name: Date of Service: [name] 05/07/2023 7:30 [name] M Medical Record Number: [id number] Patient Account Number: [id number] Date of Birth/Sex: Treating RN: 06-03-1951 (72 y.o. [name] Primary Care Shalondra Wunschel: [name] Other Clinician: [name] Referring Mace Weinberg: Treating Sojourner Behringer/Extender: [name]: 9 Vital Signs Time Taken: 07:44 Temperature (F): 97.3 Height (in): 67 Pulse (bpm): 63 Weight (lbs): 154 Respiratory Rate (breaths/min): 20 Body Mass Index (BMI): 24.1 Blood Pressure (mmHg): 128/77 Capillary Blood Glucose (mg/dl): 542 Reference Range: 80 - 120 mg / dl Electronic Signature(s) Signed: 05/07/2023 2:02:17 PM By: [name] EMT Entered By: [name] on 05/07/2023 14:02:16

## 2023-05-07 NOTE — Progress Notes (Signed)
ZYION, DOXTATER ([id number]) [id number] Page 1 of 7 Visit Report for 05/07/2023 Arrival Information Details Patient Name: Date of Service: [name] 05/07/2023 10:00 A M Medical Record Number: [id number] Patient Account Number: [id number] Date of Birth/Sex: Treating RN: 09/01/1951 (72 y.o. [name] Primary Care Kelie Gainey: [name] Other Clinician: Referring Mylah Baynes: Treating Eliodoro Gullett/Extender: [name] in Treatment: 9 Visit Information History Since Last Visit Added or deleted any medications: No Patient Arrived: Ambulatory Any new allergies or adverse reactions: No Arrival Time: 10:13 Had a fall or experienced change in No Accompanied By: self activities of daily living that may affect Transfer Assistance: None risk of falls: Patient Identification Verified: Yes Signs or symptoms of abuse/neglect since last visito No Secondary Verification Process Completed: Yes Hospitalized since last visit: No Patient Requires Transmission-Based Precautions: No Implantable device outside of the clinic excluding No Patient Has Alerts: Yes cellular tissue based products placed in the center Patient Alerts: Patient on Blood Thinner since last visit: Plavix Has Dressing in Place as Prescribed: Yes ABI R [place] Pain Present Now: No Electronic Signature(s) Signed: 05/07/2023 3:43:07 PM By: Samuella Bruin Entered By: Samuella Bruin on 05/07/2023 10:13:51 -------------------------------------------------------------------------------- Encounter Discharge Information Details Patient Name: Date of Service: [name] RD, [name] 05/07/2023 10:00 A M Medical Record Number: [id number] Patient Account Number: [id number] Date of Birth/Sex: Treating RN: November 22, 1950 (72 y.o. [name] Primary Care Abir Craine: [name] Other Clinician: Referring Dyron Kawano: Treating Bryker Fletchall/Extender: [name] in Treatment: 9 Encounter Discharge Information Items Post Procedure Vitals Discharge Condition: Stable Temperature (F): 98.4 Ambulatory Status: Ambulatory Pulse (bpm): 79 Discharge Destination: Home Respiratory Rate (breaths/min): 18 Transportation: Private Auto Blood Pressure (mmHg): 158/82 Accompanied By: self Schedule Follow-up Appointment: Yes Clinical Summary of Care: Patient Declined Electronic Signature(s) Signed: 05/07/2023 3:43:07 PM By: Samuella Bruin Entered By: Samuella Bruin on 05/07/2023 10:27:27 [name] ([id number]) [id number] Page 2 of 7 -------------------------------------------------------------------------------- Lower Extremity Assessment Details Patient Name: Date of Service: [name] 05/07/2023 10:00 A M Medical Record Number: [id number] Patient Account Number: [id number] Date of Birth/Sex: Treating RN: Dec 19, 1950 (72 y.o. [name] Primary Care Johnanna Bakke: [name] Other Clinician: Referring Karisa Nesser: Treating Carnita Golob/Extender: [name]: 9 Edema Assessment Assessed: Kyra Searles: No] Franne Forts: No] Edema: [Left: N] [Right: o] Calf Left: Right: Point of Measurement: 33 cm From Medial Instep 34.5 cm Ankle Left: Right: Point of Measurement: 10 cm From Medial Instep 21.7 cm Electronic Signature(s) Signed: 05/07/2023 3:43:07 PM By: Samuella Bruin Entered By: Samuella Bruin on 05/07/2023 10:14:22 -------------------------------------------------------------------------------- Multi Wound Chart Details Patient Name: Date of Service: [name] RD, [name] 05/07/2023 10:00 A M Medical Record Number: [id number] Patient Account Number: [id number] Date of Birth/Sex: Treating RN: 09/29/1951 (72 y.o. M) Primary Care Gianni Mihalik: [name] Other Clinician: Referring Maxwell Martorano: Treating Creola Krotz/Extender: [name]  in Treatment: 9 Vital Signs Height(in): 67 Capillary Blood Glucose(mg/dl): 517 Weight(lbs): 616 Pulse(bpm): 79 Body Mass Index(BMI): 24.1 Blood Pressure(mmHg): 158/82 Temperature(F): 98.4 Respiratory Rate(breaths/min): 18 [8:Photos:] [N/A:N/A] Right T Great oe N/A N/A Wound Location: Gradually Appeared N/A N/A Wounding Event: Diabetic Wound/Ulcer of the Lower N/A N/A Primary Etiology: Extremity Hypertension, Type II Diabetes, N/A N/A Comorbid History: Osteoarthritis, Neuropathy, Received KOELLING, [name] ([id number]) [id number] Page 3 of 7 Radiation 02/04/2023 N/A N/A Date Acquired: 9 N/A N/A Weeks of Treatment: Open N/A N/A Wound Status: No N/A N/A Wound Recurrence: 0.6x1.2x0.1 N/A N/A Measurements L x W x D (cm) 0.565 N/A N/A A (  cm) : rea 0.057 N/A N/A Volume (cm) : 95.10% N/A N/A % Reduction in A rea: 97.50% N/A N/A % Reduction in Volume: Grade 3 N/A N/A Classification: Medium N/A N/A Exudate A mount: Serosanguineous N/A N/A Exudate Type: red, brown N/A N/A Exudate Color: Distinct, outline attached N/A N/A Wound Margin: Large (67-100%) N/A N/A Granulation A mount: Red, Hyper-granulation N/A N/A Granulation Quality: Small (1-33%) N/A N/A Necrotic A mount: Fat Layer (Subcutaneous Tissue): Yes N/A N/A Exposed Structures: Fascia: No Tendon: No Muscle: No Joint: No Bone: No Medium (34-66%) N/A N/A Epithelialization: Debridement - Selective/Open Wound N/A N/A Debridement: Pre-procedure Verification/Time Out 10:22 N/A N/A Taken: Lidocaine 4% T opical Solution N/A N/A Pain Control: Callus, Slough N/A N/A Tissue Debrided: Non-Viable Tissue N/A N/A Level: 0.57 N/A N/A Debridement A (sq cm): rea Curette N/A N/A Instrument: Minimum N/A N/A Bleeding: Pressure N/A N/A Hemostasis A chieved: Procedure was tolerated well N/A N/A Debridement Treatment Response: 0.6x1.2x0.1 N/A N/A Post Debridement Measurements L x W  x D (cm) 0.057 N/A N/A Post Debridement Volume: (cm) Callus: No N/A N/A Periwound Skin Texture: Maceration: No N/A N/A Periwound Skin Moisture: Dry/Scaly: No No Abnormalities Noted N/A N/A Periwound Skin Color: No Abnormality N/A N/A Temperature: Cellular or Tissue Based Product N/A N/A Procedures Performed: Debridement Treatment Notes Wound #8 (Toe Great) Wound Laterality: Right Cleanser Normal Saline Discharge Instruction: Cleanse the wound with Normal Saline prior to applying a clean dressing using gauze sponges, not tissue or cotton balls. Soap and Water Discharge Instruction: May shower and wash wound with dial antibacterial soap and water prior to dressing change. Peri-Wound Care Topical Primary Dressing Epicord Secondary Dressing ADAPTIC TOUCH 3x4.25 in Discharge Instruction: Apply over primary dressing as directed. Woven Gauze Sponges 2x2 in Discharge Instruction: Apply over primary dressing as directed. Secured With Conforming Stretch Gauze Bandage, Sterile 2x75 (in/in) Discharge Instruction: Secure with stretch gauze as directed. 47M Medipore H Soft Cloth Surgical T ape, 4 x 10 (in/yd) Discharge Instruction: Secure with tape as directed. Compression Wrap Compression Stockings Add-Ons JEANNE, TERRANCE ([id number]) [id number] Page 4 of 7 Electronic Signature(s) Signed: 05/07/2023 10:30:29 AM By: [name] MD FACS Entered By: [name] on 05/07/2023 10:30:29 -------------------------------------------------------------------------------- Multi-Disciplinary Care Plan Details Patient Name: Date of Service: [name] RD, [name] 05/07/2023 10:00 A M Medical Record Number: [id number] Patient Account Number: [id number] Date of Birth/Sex: Treating RN: 1950/11/10 (72 y.o. [name] Primary Care Saban Heinlen: [name] Other Clinician: Referring Kenidi Elenbaas: Treating Mikiyah Glasner/Extender: [name]  in Treatment: 9 Multidisciplinary Care Plan reviewed with physician Active Inactive HBO Nursing Diagnoses: Anxiety related to knowledge deficit of hyperbaric oxygen therapy and treatment procedures Goals: Patient will tolerate the hyperbaric oxygen therapy treatment Date Initiated: 03/02/2023 Target Resolution Date: 05/27/2023 Goal Status: Active Interventions: Assess for signs and symptoms related to adverse events, including but not limited to confinement anxiety, pneumothorax, oxygen toxicity and baurotrauma Notes: Wound/Skin Impairment Nursing Diagnoses: Impaired tissue integrity Knowledge deficit related to ulceration/compromised skin integrity Goals: Patient/caregiver will verbalize understanding of skin care regimen Date Initiated: 03/17/2023 Target Resolution Date: 05/29/2023 Goal Status: Active Ulcer/skin breakdown will have a volume reduction of 30% by week 4 Date Initiated: 03/17/2023 Date Inactivated: 04/02/2023 Target Resolution Date: 03/31/2023 Goal Status: Met Ulcer/skin breakdown will have a volume reduction of 50% by week 8 Date Initiated: 04/02/2023 Date Inactivated: 05/07/2023 Target Resolution Date: 04/30/2023 Goal Status: Met Interventions: Assess patient/caregiver ability to obtain necessary supplies Assess patient/caregiver ability to perform ulcer/skin care regimen upon admission and as  needed Assess ulceration(s) every visit Treatment Activities: Skin care regimen initiated : 03/17/2023 Topical wound management initiated : 03/17/2023 Notes: Electronic Signature(s) Signed: 05/07/2023 3:43:07 PM By: Samuella Bruin Entered By: Samuella Bruin on 05/07/2023 10:26:27 [name] ([id number]) [id number] Page 5 of 7 -------------------------------------------------------------------------------- Pain Assessment Details Patient Name: Date of Service: [name] 05/07/2023 10:00 A M Medical Record Number: [id number] Patient  Account Number: [id number] Date of Birth/Sex: Treating RN: 1951/05/26 (72 y.o. [name] Primary Care Daijanae Rafalski: [name] Other Clinician: Referring Hugo Lybrand: Treating Cathey Fredenburg/Extender: [name]: 9 Active Problems Location of Pain Severity and Description of Pain Patient Has Paino No Site Locations Rate the pain. Current Pain Level: 0 Pain Management and Medication Current Pain Management: Electronic Signature(s) Signed: 05/07/2023 3:43:07 PM By: Samuella Bruin Entered By: Samuella Bruin on 05/07/2023 10:14:19 -------------------------------------------------------------------------------- Patient/Caregiver Education Details Patient Name: Date of Service: [name] RD, [name] 7/11/2024andnbsp10:00 A M Medical Record Number: [id number] Patient Account Number: [id number] Date of Birth/Gender: Treating RN: 1951-06-06 (72 y.o. [name] Primary Care Physician: [name] Other Clinician: Referring Physician: Treating Physician/Extender: [name] in Treatment: 9 Education Assessment Education Provided To: Patient Education Topics Provided Wound/Skin Impairment: Methods: Explain/Verbal Responses: Reinforcements needed, State content correctly Sanborn, [name] ([id number]) [id number] Page 6 of 7 Electronic Signature(s) Signed: 05/07/2023 3:43:07 PM By: Samuella Bruin Entered By: Samuella Bruin on 05/07/2023 10:26:38 -------------------------------------------------------------------------------- Wound Assessment Details Patient Name: Date of Service: [name] RD, [name] 05/07/2023 10:00 A M Medical Record Number: [id number] Patient Account Number: [id number] Date of Birth/Sex: Treating RN: 1951/09/21 (72 y.o. [name] Primary Care Ernesteen Mihalic: [name] Other Clinician: Referring Delynda Sepulveda: Treating Jaceyon Strole/Extender: [name]: 9 Wound Status Wound Number: 8 Primary Diabetic Wound/Ulcer of the Lower Extremity Etiology: Wound Location: Right T Great oe Wound Open Wounding Event: Gradually Appeared Status: Date Acquired: 02/04/2023 Comorbid Hypertension, Type II Diabetes, Osteoarthritis, Neuropathy, Weeks Of Treatment: 9 History: Received Radiation Clustered Wound: No Photos Wound Measurements Length: (cm) 0.6 Width: (cm) 1.2 Depth: (cm) 0.1 Area: (cm) 0.565 Volume: (cm) 0.057 % Reduction in Area: 95.1% % Reduction in Volume: 97.5% Epithelialization: Medium (34-66%) Tunneling: No Undermining: No Wound Description Classification: Grade 3 Wound Margin: Distinct, outline attached Exudate Amount: Medium Exudate Type: Serosanguineous Exudate Color: red, brown Foul Odor After Cleansing: No Slough/Fibrino Yes Wound Bed Granulation Amount: Large (67-100%) Exposed Structure Granulation Quality: Red, Hyper-granulation Fascia Exposed: No Necrotic Amount: Small (1-33%) Fat Layer (Subcutaneous Tissue) Exposed: Yes Necrotic Quality: Adherent Slough Tendon Exposed: No Muscle Exposed: No Joint Exposed: No Bone Exposed: No Periwound Skin Texture Texture Color No Abnormalities Noted: Yes No Abnormalities Noted: Yes Moisture Temperature / Pain Andy, Ulus ([id number]) [id number] Page 7 of 7 No Abnormalities Noted: No Temperature: No Abnormality Dry / Scaly: No Maceration: No Treatment Notes Wound #8 (Toe Great) Wound Laterality: Right Cleanser Normal Saline Discharge Instruction: Cleanse the wound with Normal Saline prior to applying a clean dressing using gauze sponges, not tissue or cotton balls. Soap and Water Discharge Instruction: May shower and wash wound with dial antibacterial soap and water prior to dressing change. Peri-Wound Care Topical Primary Dressing Epicord Secondary Dressing ADAPTIC TOUCH 3x4.25  in Discharge Instruction: Apply over primary dressing as directed. Woven Gauze Sponges 2x2 in Discharge Instruction: Apply over primary dressing as directed. Secured With Conforming Stretch Gauze Bandage, Sterile 2x75 (in/in) Discharge Instruction: Secure with stretch gauze as directed. 54M Medipore H Soft Cloth Surgical T ape, 4  x 10 (in/yd) Discharge Instruction: Secure with tape as directed. Compression Wrap Compression Stockings Add-Ons Electronic Signature(s) Signed: 05/07/2023 3:43:07 PM By: Samuella Bruin Entered By: Samuella Bruin on 05/07/2023 10:21:32 -------------------------------------------------------------------------------- Vitals Details Patient Name: Date of Service: [name], [name] 05/07/2023 10:00 A M Medical Record Number: [id number] Patient Account Number: [id number] Date of Birth/Sex: Treating RN: 1951/03/10 (72 y.o. [name] Primary Care Juandavid Dallman: [name] Other Clinician: Referring Jamile Rekowski: Treating Lanny Donoso/Extender: [name]: 9 Vital Signs Time Taken: 10:07 Temperature (F): 98.4 Height (in): 67 Pulse (bpm): 79 Weight (lbs): 154 Respiratory Rate (breaths/min): 18 Body Mass Index (BMI): 24.1 Blood Pressure (mmHg): 158/82 Capillary Blood Glucose (mg/dl): 409 Reference Range: 80 - 120 mg / dl Electronic Signature(s) Signed: 05/07/2023 3:43:07 PM By: Samuella Bruin Entered By: Samuella Bruin on 05/07/2023 10:14:14

## 2023-05-07 NOTE — Telephone Encounter (Signed)
Pt called to ask if MD was still his provider? Pt was last here on 11/26/22. Pt was asked why he thought he was no longer MD's Pt? Pt stated the reason is because he "has not heard" from MD in over 6 months, and wants to know why. Pt was offered an OV, and stated he just wants to know why MD has not called him? Please advise.

## 2023-05-07 NOTE — Progress Notes (Signed)
HAIK, MAHONEY ([id number]) [id number] Page 1 of 2 Visit Report for 05/07/2023 Problem List Details Patient Name: Date of Service: [name] 05/07/2023 7:30 A M Medical Record Number: [id number] Patient Account Number: [id number] Date of Birth/Sex: Treating RN: 16-Nov-1950 (72 y.o. 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 M86.171 Other acute osteomyelitis, right ankle and foot 03/02/2023 No Yes E11.621 Type 2 diabetes mellitus with foot ulcer 03/02/2023 No Yes I10 Essential (primary) hypertension 03/02/2023 No Yes N18.32 Chronic kidney disease, stage 3b 03/02/2023 No Yes Inactive Problems Resolved Problems ICD-10 Code Description Active Date Resolved Date L97.512 Non-pressure chronic ulcer of other part of right foot with fat layer 03/09/2023 03/09/2023 exposed Electronic Signature(s) Signed: 05/07/2023 2:06:34 PM By: [name] EMT Signed: 05/07/2023 2:37:15 PM By: [name] MD FACS Entered By: [name] on 05/07/2023 14:06:34 -------------------------------------------------------------------------------- SuperBill Details Patient Name: Date of Service: [name] RD, [name] 05/07/2023 Medical Record Number: [id number] Patient Account Number: [id number] Date of Birth/Sex: Treating RN: 05/30/1951 (72 y.o. [name] [place], Nesanel ([id number]) [phone].pdf Page 2 of 2 Primary Care Provider: [name] Other Clinician: [name] Referring Provider: Treating Provider/Extender: [name] in Treatment: 9 Diagnosis Coding ICD-10 Codes Code  Description [phone] Non-pressure chronic ulcer of other part of right foot with necrosis of bone M86.171 Other acute osteomyelitis, right ankle and foot E11.621 Type 2 diabetes mellitus with foot ulcer I10 Essential (primary) hypertension N18.32 Chronic kidney disease, stage 3b Facility Procedures : CPT4 Code Description: [id number]-(Facility Use Only) HBOT full body chamber, , ICD-10 Diagnosis Description E11.621 Type 2 diabetes mellitus with foot ulcer L97.514 Non-pressure chronic ulcer of other part of right foot wi M86.171 Other acute  osteomyelitis, right ankle and foot Modifier: th necrosis o Quantity: 4 f bone Physician Procedures : CPT4 Code Description Modifier [id number] - WC PHYS HYPERBARIC OXYGEN THERAPY ICD-10 Diagnosis Description E11.621 Type 2 diabetes mellitus with foot ulcer L97.514 Non-pressure chronic ulcer of other part of right foot with necrosis o M86.171 Other  acute osteomyelitis, right ankle and foot Quantity: 1 f bone Electronic Signature(s) Signed: 05/07/2023 2:06:29 PM By: [name] EMT Signed: 05/07/2023 2:37:15 PM By: [name] MD FACS Entered By: [name] on 05/07/2023 14:06:29

## 2023-05-08 ENCOUNTER — Encounter (HOSPITAL_BASED_OUTPATIENT_CLINIC_OR_DEPARTMENT_OTHER): Payer: Medicare HMO | Admitting: General Surgery

## 2023-05-08 DIAGNOSIS — N1832 Chronic kidney disease, stage 3b: Secondary | ICD-10-CM | POA: Diagnosis not present

## 2023-05-08 DIAGNOSIS — I129 Hypertensive chronic kidney disease with stage 1 through stage 4 chronic kidney disease, or unspecified chronic kidney disease: Secondary | ICD-10-CM | POA: Diagnosis not present

## 2023-05-08 DIAGNOSIS — M86171 Other acute osteomyelitis, right ankle and foot: Secondary | ICD-10-CM | POA: Diagnosis not present

## 2023-05-08 DIAGNOSIS — L97514 Non-pressure chronic ulcer of other part of right foot with necrosis of bone: Secondary | ICD-10-CM | POA: Diagnosis not present

## 2023-05-08 DIAGNOSIS — E11621 Type 2 diabetes mellitus with foot ulcer: Secondary | ICD-10-CM | POA: Diagnosis not present

## 2023-05-08 DIAGNOSIS — E1122 Type 2 diabetes mellitus with diabetic chronic kidney disease: Secondary | ICD-10-CM | POA: Diagnosis not present

## 2023-05-08 LAB — GLUCOSE, CAPILLARY
Glucose-Capillary: 239 mg/dL — ABNORMAL HIGH (ref 70–99)
Glucose-Capillary: 246 mg/dL — ABNORMAL HIGH (ref 70–99)

## 2023-05-08 NOTE — Progress Notes (Addendum)
Harvey, [name] ([id number]) [id number] Page 1 of 2 Visit Report for 05/08/2023 HBO Details Patient Name: Date of Service: [name] 05/08/2023 7:30 A M Medical Record Number: [id number] Patient Account Number: [id number] Date of Birth/Sex: Treating RN: [birth year] (72 y.o. [name], [name] Primary Care Joely Losier: [name] Other Clinician: [name] Referring Brunilda Eble: Treating Quy Lotts/Extender: [name] in Treatment: 9 HBO Treatment Course Details Treatment Course Number: 1 Ordering Saba Neuman: [name] T Treatments Ordered: otal 40 HBO Treatment Start Date: 04/06/2023 HBO Indication: Diabetic Ulcer(s) of the Lower Extremity Standard/Conservative Wound Care tried and failed greater than or equal to 30 days Wound #8 Right T Great oe HBO Treatment Details Treatment Number: 19 Patient Type: Outpatient Chamber Type: Monoplace Chamber Serial #: [id number] Treatment Protocol: 2.5 ATA with 90 minutes oxygen, with two 5 minute air breaks Treatment Details Compression Rate Down: 2.0 psi / minute De-Compression Rate Up: 2.0 psi / minute A breaks and breathing ir Compress Tx Pressure periods Decompress Decompress Begins Reached (leave unused spaces Begins Ends blank) Chamber Pressure (ATA 1 2.5 2.5 2.5 2.5 2.5 - - 2.5 1 ) Clock Time (24 hr) 07:58 08:09 09:39 08:44 09:14 09:19 - - 09:49 10:01 Treatment Length: 123 (minutes) Treatment Segments: 4 Vital Signs Capillary Blood Glucose Reference Range: 80 - 120 mg / dl HBO Diabetic Blood Glucose Intervention Range: <131 mg/dl or >323 mg/dl Time Vitals Blood Respiratory Capillary Blood Glucose Pulse Action Type: Pulse: Temperature: Taken: Pressure: Rate: Glucose (mg/dl): Meter #: Oximetry (%) Taken: Pre 07:36 155/94 76 20 97.4 239 Post 10:03 189/94 82 18 98.2 246 Treatment Response Treatment Toleration: Well Treatment Completion Status: Treatment Completed  without Adverse Event Treatment Notes The patient was given 8 oz of Glucerna to drink before the treatment was started per Dr. order. Additional Procedure Documentation Tissue Sevierity: Necrosis of bone Physician HBO Attestation: I certify that I supervised this HBO treatment in accordance with Medicare guidelines. A trained emergency response team is readily available per Yes hospital policies and procedures. Continue HBOT as ordered. Yes Electronic Signature(s) Signed: 05/08/2023 11:13:34 AM By: [name] MD FACS Previous Signature: 05/08/2023 10:58:21 AM Version By: [name] EMT Entered By: [name] on 05/08/2023 11:13:33 Diener, [name] ([id number]) [id number] Page 2 of 2 -------------------------------------------------------------------------------- HBO Safety Checklist Details Patient Name: Date of Service: [name] 05/08/2023 7:30 A M Medical Record Number: [id number] Patient Account Number: [id number] Date of Birth/Sex: Treating RN: Jun 02, 1951 (72 y.o. [name], [name] Primary Care Frederick Marro: [name] Other Clinician: [name] Referring Ruthvik Barnaby: Treating Kennie Karapetian/Extender: [name]: 9 HBO Safety Checklist Items Safety Checklist Consent Form Signed Patient voided / foley secured and emptied When did you last eato 0615 Last dose of injectable or oral agent Last Night Ostomy pouch emptied and vented if applicable NA All implantable devices assessed, documented and approved NA Intravenous access site secured and place NA Valuables secured Linens and cotton and cotton/polyester blend (less than 51% polyester) Personal oil-based products / skin lotions / body lotions removed Wigs or hairpieces removed NA Smoking or tobacco materials removed Books / newspapers / magazines / loose paper removed Cologne, aftershave, perfume and deodorant removed Jewelry removed (may  wrap wedding band) Make-up removed NA Hair care products removed Battery operated devices (external) removed Heating patches and chemical warmers removed Titanium eyewear removed NA Nail polish cured greater than 10 hours NA Casting material cured greater than 10 hours NA Hearing aids removed NA Loose dentures or  partials removed removed by patient Prosthetics have been removed NA Patient demonstrates correct use of air break device (if applicable) Patient concerns have been addressed Patient grounding bracelet on and cord attached to chamber Specifics for Inpatients (complete in addition to above) Medication sheet sent with patient NA Intravenous medications needed or due during therapy sent with patient NA Drainage tubes (e.g. nasogastric tube or chest tube secured and vented) NA Endotracheal or Tracheotomy tube secured NA Cuff deflated of air and inflated with saline NA Airway suctioned NA Notes The safety checklist was done before the treatment was started. Electronic Signature(s) Signed: 05/08/2023 10:55:42 AM By: [name] EMT Entered By: [name] on 05/08/2023 10:55:42

## 2023-05-08 NOTE — Progress Notes (Signed)
HENRIETTA, TRELA ([id number]) [id number] Page 1 of 2 Visit Report for 05/08/2023 Problem List Details Patient Name: Date of Service: [name] 05/08/2023 7:30 A M Medical Record Number: [id number] Patient Account Number: [id number] Date of Birth/Sex: Treating RN: 02-19-1951 (72 y.o. 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 No Yes necrosis of bone M86.171 Other acute osteomyelitis, right ankle and foot 03/02/2023 No Yes E11.621 Type 2 diabetes mellitus with foot ulcer 03/02/2023 No Yes I10 Essential (primary) hypertension 03/02/2023 No Yes N18.32 Chronic kidney disease, stage 3b 03/02/2023 No Yes Inactive Problems Resolved Problems ICD-10 Code Description Active Date Resolved Date L97.512 Non-pressure chronic ulcer of other part of right foot with fat layer 03/09/2023 03/09/2023 exposed Electronic Signature(s) Signed: 05/08/2023 10:58:49 AM By: [name] EMT Signed: 05/08/2023 11:13:08 AM By: [name] MD FACS Entered By: [name] on 05/08/2023 10:58:49 -------------------------------------------------------------------------------- SuperBill Details Patient Name: Date of Service: [name], [name] 05/08/2023 Medical Record Number: [id number] Patient Account Number: [id number] Date of Birth/Sex: Treating RN: 24-Oct-1951 (72 y.o. [name] [place], Ramesses ([id number]) [id number] Page 2 of 2 Primary Care Provider: [name] Other Clinician: [name] Referring Provider: Treating Provider/Extender: [name] in Treatment: 9 Diagnosis Coding ICD-10 Codes Code  Description [phone] Non-pressure chronic ulcer of other part of right foot with necrosis of bone M86.171 Other acute osteomyelitis, right ankle and foot E11.621 Type 2 diabetes mellitus with foot ulcer I10 Essential (primary) hypertension N18.32 Chronic kidney disease, stage 3b Facility Procedures : CPT4 Code Description: [id number] G0277-(Facility Use Only) HBOT full body chamber, , ICD-10 Diagnosis Description E11.621 Type 2 diabetes mellitus with foot ulcer L97.514 Non-pressure chronic ulcer of other part of right foot wi M86.171 Other acute  osteomyelitis, right ankle and foot Modifier: th necrosis o Quantity: 4 f bone Physician Procedures : CPT4 Code Description Modifier [id number] - WC PHYS HYPERBARIC OXYGEN THERAPY ICD-10 Diagnosis Description E11.621 Type 2 diabetes mellitus with foot ulcer L97.514 Non-pressure chronic ulcer of other part of right foot with necrosis o M86.171 Other  acute osteomyelitis, right ankle and foot Quantity: 1 f bone Electronic Signature(s) Signed: 05/08/2023 10:58:43 AM By: [name] EMT Signed: 05/08/2023 11:13:08 AM By: [name] MD FACS Entered By: [name] on 05/08/2023 10:58:42

## 2023-05-08 NOTE — Progress Notes (Addendum)
[name], [name] ([id number]) [id number] Page 1 of 2 Visit Report for 05/08/2023 Arrival Information Details Patient Name: Date of Service: [name] 05/08/2023 7:30 A M Medical Record Number: [id number] Patient Account Number: [id number] Date of Birth/Sex: Treating RN: 04-17-1951 (72 y.o. [name], [name] Primary Care Jaclene Bartelt: [name] Other Clinician: [name] Referring Mats Jeanlouis: Treating Deina Lipsey/Extender: [name] in Treatment: 9 Visit Information History Since Last Visit All ordered tests and consults were completed: Yes Patient Arrived: Ambulatory Added or deleted any medications: No Arrival Time: 07:30 Any new allergies or adverse reactions: No Accompanied By: None Had a fall or experienced change in No Transfer Assistance: None activities of daily living that may affect Patient Identification Verified: Yes risk of falls: Secondary Verification Process Completed: Yes Signs or symptoms of abuse/neglect since last visito No Patient Requires Transmission-Based Precautions: No Hospitalized since last visit: No Patient Has Alerts: Yes Implantable device outside of the clinic excluding No Patient Alerts: Patient on Blood Thinner cellular tissue based products placed in the center Plavix since last visit: ABI R Graves Pain Present Now: No Electronic Signature(s) Signed: 05/08/2023 10:53:46 AM By: [name] EMT Entered By: [name] on 05/08/2023 10:53:46 -------------------------------------------------------------------------------- Encounter Discharge Information Details Patient Name: Date of Service: [name] RD, [name] 05/08/2023 7:30 A M Medical Record Number: [id number] Patient Account Number: [id number] Date of Birth/Sex: Treating RN: Aug 24, 1951 (71 y.o. [name], [name] Primary Care Sylvestre Rathgeber: [name] Other Clinician: [name] Referring Leeasia Secrist: Treating Felipe Cabell/Extender:  [name] in Treatment: 9 Encounter Discharge Information Items Discharge Condition: Stable Ambulatory Status: Ambulatory Discharge Destination: Home Transportation: Private Auto Accompanied By: None Schedule Follow-up Appointment: Yes Clinical Summary of Care: Electronic Signature(s) Signed: 05/08/2023 10:59:23 AM By: [name] EMT Entered By: [name] on 05/08/2023 10:59:23 [name] ([id number]) [id number] Page 2 of 2 -------------------------------------------------------------------------------- Vitals Details Patient Name: Date of Service: [name] 05/08/2023 7:30 A M Medical Record Number: [id number] Patient Account Number: [id number] Date of Birth/Sex: Treating RN: 1950/11/03 (72 y.o. [name], [name] Primary Care Erline Siddoway: [name] Other Clinician: [name] Referring Pinchas Reither: Treating Jhamal Plucinski/Extender: [name]: 9 Vital Signs Time Taken: 07:36 Temperature (F): 97.4 Height (in): 67 Pulse (bpm): 76 Weight (lbs): 154 Respiratory Rate (breaths/min): 20 Body Mass Index (BMI): 24.1 Blood Pressure (mmHg): 155/94 Capillary Blood Glucose (mg/dl): 762 Reference Range: 80 - 120 mg / dl Electronic Signature(s) Signed: 05/08/2023 10:54:21 AM By: [name] EMT Entered By: [name] on 05/08/2023 10:54:21

## 2023-05-11 ENCOUNTER — Encounter (HOSPITAL_BASED_OUTPATIENT_CLINIC_OR_DEPARTMENT_OTHER): Payer: Medicare HMO | Admitting: General Surgery

## 2023-05-11 NOTE — Telephone Encounter (Signed)
ATC pt, no option to leave VM. Pt still shows that Dr Salomon Fick is PCP. Refill for insulin pens was sent to [facility], as he changed pharmacies, pt can contact [facility] for refills or have them transferred to [facility].

## 2023-05-12 ENCOUNTER — Other Ambulatory Visit: Payer: Self-pay

## 2023-05-12 ENCOUNTER — Encounter (HOSPITAL_BASED_OUTPATIENT_CLINIC_OR_DEPARTMENT_OTHER): Payer: Medicare HMO | Admitting: General Surgery

## 2023-05-12 DIAGNOSIS — E11621 Type 2 diabetes mellitus with foot ulcer: Secondary | ICD-10-CM | POA: Diagnosis not present

## 2023-05-12 DIAGNOSIS — I129 Hypertensive chronic kidney disease with stage 1 through stage 4 chronic kidney disease, or unspecified chronic kidney disease: Secondary | ICD-10-CM | POA: Diagnosis not present

## 2023-05-12 DIAGNOSIS — L97514 Non-pressure chronic ulcer of other part of right foot with necrosis of bone: Secondary | ICD-10-CM | POA: Diagnosis not present

## 2023-05-12 DIAGNOSIS — M86171 Other acute osteomyelitis, right ankle and foot: Secondary | ICD-10-CM | POA: Diagnosis not present

## 2023-05-12 DIAGNOSIS — E1122 Type 2 diabetes mellitus with diabetic chronic kidney disease: Secondary | ICD-10-CM | POA: Diagnosis not present

## 2023-05-12 DIAGNOSIS — N1832 Chronic kidney disease, stage 3b: Secondary | ICD-10-CM | POA: Diagnosis not present

## 2023-05-12 LAB — GLUCOSE, CAPILLARY
Glucose-Capillary: 197 mg/dL — ABNORMAL HIGH (ref 70–99)
Glucose-Capillary: 207 mg/dL — ABNORMAL HIGH (ref 70–99)

## 2023-05-12 MED ORDER — BD PEN NEEDLE NANO 2ND GEN 32G X 4 MM MISC: 3 refills | Status: DC

## 2023-05-12 NOTE — Progress Notes (Addendum)
[place], [name] ([id number]) [id number] Page 1 of 2 Visit Report for 05/12/2023 HBO Details Patient Name: Date of Service: [name] 05/12/2023 7:30 A M Medical Record Number: [id number] Patient Account Number: [id number] Date of Birth/Sex: Treating RN: [birth year] (72 y.o. [name], [name] Primary Care Anaiza Behrens: [name] Other Clinician: [name] Referring Mong Neal: Treating Kenedie Dirocco/Extender: [name] in Treatment: 10 HBO Treatment Course Details Treatment Course Number: 1 Ordering Dorian Duval: [name] T Treatments Ordered: otal 40 HBO Treatment Start Date: 04/06/2023 HBO Indication: Diabetic Ulcer(s) of the Lower Extremity Standard/Conservative Wound Care tried and failed greater than or equal to 30 days Wound #8 Right T Great oe HBO Treatment Details Treatment Number: 20 Patient Type: Outpatient Chamber Type: Monoplace Chamber Serial #: [id number] Treatment Protocol: 2.5 ATA with 90 minutes oxygen, with two 5 minute air breaks Treatment Details Compression Rate Down: 2.0 psi / minute De-Compression Rate Up: 2.0 psi / minute A breaks and breathing ir Compress Tx Pressure periods Decompress Decompress Begins Reached (leave unused spaces Begins Ends blank) Chamber Pressure (ATA 1 2.5 2.5 2.5 2.5 2.5 - - 2.5 1 ) Clock Time (24 hr) 08:04 08:14 08:44 08:49 09:19 09:24 - - 09:54 10:01 Treatment Length: 117 (minutes) Treatment Segments: 4 Vital Signs Capillary Blood Glucose Reference Range: 80 - 120 mg / dl HBO Diabetic Blood Glucose Intervention Range: <131 mg/dl or >366 mg/dl Time Vitals Blood Respiratory Capillary Blood Glucose Pulse Action Type: Pulse: Temperature: Taken: Pressure: Rate: Glucose (mg/dl): Meter #: Oximetry (%) Taken: Pre 07:50 162/84 70 16 97.9 197 Post 10:07 174/92 77 16 98.1 207 Treatment Response Treatment Toleration: Well Treatment Completion Status: Treatment  Completed without Adverse Event Additional Procedure Documentation Tissue Sevierity: Necrosis of bone Physician HBO Attestation: I certify that I supervised this HBO treatment in accordance with Medicare guidelines. A trained emergency response team is readily available per Yes hospital policies and procedures. Continue HBOT as ordered. Yes Electronic Signature(s) Signed: 05/12/2023 3:29:57 PM By: [name] MD FACS Previous Signature: 05/12/2023 1:26:05 PM Version By: [name] EMT Entered By: [name] on 05/12/2023 15:29:57 [place], [name] ([id number]) [id number] Page 2 of 2 -------------------------------------------------------------------------------- HBO Safety Checklist Details Patient Name: Date of Service: [name] 05/12/2023 7:30 A M Medical Record Number: [id number] Patient Account Number: [id number] Date of Birth/Sex: Treating RN: 1951-04-09 (72 y.o. [name], [name] Primary Care Rilley Poulter: [name] Other Clinician: [name] Referring Wilkes Potvin: Treating Crockett Rallo/Extender: [name]: 10 HBO Safety Checklist Items Safety Checklist Consent Form Signed Patient voided / foley secured and emptied When did you last eato 0700 Last dose of injectable or oral agent Yesterday Ostomy pouch emptied and vented if applicable NA All implantable devices assessed, documented and approved NA Intravenous access site secured and place NA Valuables secured Linens and cotton and cotton/polyester blend (less than 51% polyester) Personal oil-based products / skin lotions / body lotions removed Wigs or hairpieces removed NA Smoking or tobacco materials removed Books / newspapers / magazines / loose paper removed Cologne, aftershave, perfume and deodorant removed Jewelry removed (may wrap wedding band) Make-up removed NA Hair care products removed Battery operated devices (external)  removed Heating patches and chemical warmers removed Titanium eyewear removed NA Nail polish cured greater than 10 hours NA Casting material cured greater than 10 hours NA Hearing aids removed NA Loose dentures or partials removed removed by patient Prosthetics have been removed NA Patient demonstrates correct use of air break device (if applicable) Patient  concerns have been addressed Patient grounding bracelet on and cord attached to chamber Specifics for Inpatients (complete in addition to above) Medication sheet sent with patient NA Intravenous medications needed or due during therapy sent with patient NA Drainage tubes (e.g. nasogastric tube or chest tube secured and vented) NA Endotracheal or Tracheotomy tube secured NA Cuff deflated of air and inflated with saline NA Airway suctioned NA Notes The safety checklist was done before the treatment was started. Electronic Signature(s) Signed: 05/12/2023 1:24:38 PM By: [name] EMT Entered By: [name] on 05/12/2023 13:24:38

## 2023-05-12 NOTE — Progress Notes (Signed)
MOHSIN, CRUM ([id number]) [id number] Page 1 of 2 Visit Report for 05/12/2023 Arrival Information Details Patient Name: Date of Service: [name] 05/12/2023 7:30 [name] M Medical Record Number: [id number] Patient Account Number: [id number] Date of Birth/Sex: Treating RN: [birth year] (72 y.o. [name] Primary Care Telisha Zawadzki: [name] Other Clinician: [name] Referring Lavontay Kirk: Treating Michiah Masse/Extender: [name] in Treatment: 10 Visit Information History Since Last Visit All ordered tests and consults were completed: Yes Patient Arrived: Ambulatory Added or deleted any medications: No Arrival Time: 07:45 Any new allergies or adverse reactions: No Accompanied By: None Had [name] fall or experienced change in No Transfer Assistance: None activities of daily living that may affect Patient Identification Verified: Yes risk of falls: Secondary Verification Process Completed: Yes Signs or symptoms of abuse/neglect since last visito No Patient Requires Transmission-Based Precautions: No Hospitalized since last visit: No Patient Has Alerts: Yes Implantable device outside of the clinic excluding No Patient Alerts: Patient on Blood Thinner cellular tissue based products placed in the center Plavix since last visit: ABI R [place] Pain Present Now: No Electronic Signature(s) Signed: 05/12/2023 1:22:10 PM By: [name] EMT Entered By: [name] on 05/12/2023 13:22:10 -------------------------------------------------------------------------------- Encounter Discharge Information Details Patient Name: Date of Service: [name], [name] 05/12/2023 7:30 [name] M Medical Record Number: [id number] Patient Account Number: [id number] Date of Birth/Sex: Treating RN: 01-May-1951 (72 y.o. [name], [name] Primary Care Aryeh Butterfield: [name] Other Clinician: [name] Referring Keta [place]: Treating Jayleen Scaglione/Extender:  [name] in Treatment: 10 Encounter Discharge Information Items Discharge Condition: Stable Ambulatory Status: Ambulatory Discharge Destination: Home Transportation: Private Auto Accompanied By: None Schedule Follow-up Appointment: Yes Clinical Summary of Care: Electronic Signature(s) Signed: 05/12/2023 1:27:17 PM By: [name] EMT Entered By: [name] on 05/12/2023 13:27:17 [name] ([id number]) [id number] Page 2 of 2 -------------------------------------------------------------------------------- Vitals Details Patient Name: Date of Service: [name] 05/12/2023 7:30 [name] M Medical Record Number: [id number] Patient Account Number: [id number] Date of Birth/Sex: Treating RN: 19-Apr-1951 (72 y.o. [name], [name] Primary Care Yoceline Bazar: [name] Other Clinician: [name] Referring Oslo Huntsman: Treating Nitin Mckowen/Extender: [name]: 10 Vital Signs Time Taken: 07:50 Temperature (F): 97.9 Height (in): 67 Pulse (bpm): 70 Weight (lbs): 154 Respiratory Rate (breaths/min): 16 Body Mass Index (BMI): 24.1 Blood Pressure (mmHg): 162/84 Capillary Blood Glucose (mg/dl): 025 Reference Range: 80 - 120 mg / dl Electronic Signature(s) Signed: 05/12/2023 1:23:28 PM By: [name] EMT Entered By: [name] on 05/12/2023 13:23:28

## 2023-05-12 NOTE — Progress Notes (Signed)
THEUS, ESPIN ([id number]) [id number] Page 1 of 2 Visit Report for 05/12/2023 Problem List Details Patient Name: Date of Service: [name] 05/12/2023 7:30 A M Medical Record Number: [id number] Patient Account Number: [id number] Date of Birth/Sex: Treating RN: [birth year] (72 y.o. 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 M86.171 Other acute osteomyelitis, right ankle and foot 03/02/2023 No Yes E11.621 Type 2 diabetes mellitus with foot ulcer 03/02/2023 No Yes I10 Essential (primary) hypertension 03/02/2023 No Yes N18.32 Chronic kidney disease, stage 3b 03/02/2023 No Yes Inactive Problems Resolved Problems ICD-10 Code Description Active Date Resolved Date L97.512 Non-pressure chronic ulcer of other part of right foot with fat layer 03/09/2023 03/09/2023 exposed Electronic Signature(s) Signed: 05/12/2023 1:26:45 PM By: [name] EMT Signed: 05/12/2023 3:28:51 PM By: [name] MD FACS Entered By: [name] on 05/12/2023 13:26:45 -------------------------------------------------------------------------------- SuperBill Details Patient Name: Date of Service: [name], [name] 05/12/2023 Medical Record Number: [id number] Patient Account Number: [id number] Date of Birth/Sex: Treating RN: [birth year] (72 y.o. [name] [place], [place] ([id number]) [id number] Page 2 of 2 Primary Care Provider: [name] Other Clinician: [name] Referring Provider: Treating Provider/Extender: [name] in Treatment: 10 Diagnosis Coding ICD-10 Codes Code  Description [phone] Non-pressure chronic ulcer of other part of right foot with necrosis of bone M86.171 Other acute osteomyelitis, right ankle and foot E11.621 Type 2 diabetes mellitus with foot ulcer I10 Essential (primary) hypertension N18.32 Chronic kidney disease, stage 3b Facility Procedures : CPT4 Code Description: [id number] G0277-(Facility Use Only) HBOT full body chamber, , ICD-10 Diagnosis Description E11.621 Type 2 diabetes mellitus with foot ulcer L97.514 Non-pressure chronic ulcer of other part of right foot wi M86.171 Other acute  osteomyelitis, right ankle and foot Modifier: th necrosis o Quantity: 4 f bone Physician Procedures : CPT4 Code Description Modifier [id number] - WC PHYS HYPERBARIC OXYGEN THERAPY ICD-10 Diagnosis Description E11.621 Type 2 diabetes mellitus with foot ulcer L97.514 Non-pressure chronic ulcer of other part of right foot with necrosis o M86.171 Other  acute osteomyelitis, right ankle and foot Quantity: 1 f bone Electronic Signature(s) Signed: 05/12/2023 1:26:32 PM By: [name] EMT Signed: 05/12/2023 3:28:51 PM By: [name] MD FACS Entered By: [name] on 05/12/2023 13:26:32

## 2023-05-13 ENCOUNTER — Encounter (HOSPITAL_BASED_OUTPATIENT_CLINIC_OR_DEPARTMENT_OTHER): Payer: Medicare HMO | Admitting: General Surgery

## 2023-05-13 DIAGNOSIS — L97514 Non-pressure chronic ulcer of other part of right foot with necrosis of bone: Secondary | ICD-10-CM | POA: Diagnosis not present

## 2023-05-13 DIAGNOSIS — I129 Hypertensive chronic kidney disease with stage 1 through stage 4 chronic kidney disease, or unspecified chronic kidney disease: Secondary | ICD-10-CM | POA: Diagnosis not present

## 2023-05-13 DIAGNOSIS — M86171 Other acute osteomyelitis, right ankle and foot: Secondary | ICD-10-CM | POA: Diagnosis not present

## 2023-05-13 DIAGNOSIS — E1122 Type 2 diabetes mellitus with diabetic chronic kidney disease: Secondary | ICD-10-CM | POA: Diagnosis not present

## 2023-05-13 DIAGNOSIS — N1832 Chronic kidney disease, stage 3b: Secondary | ICD-10-CM | POA: Diagnosis not present

## 2023-05-13 DIAGNOSIS — E11621 Type 2 diabetes mellitus with foot ulcer: Secondary | ICD-10-CM | POA: Diagnosis not present

## 2023-05-13 LAB — GLUCOSE, CAPILLARY
Glucose-Capillary: 205 mg/dL — ABNORMAL HIGH (ref 70–99)
Glucose-Capillary: 254 mg/dL — ABNORMAL HIGH (ref 70–99)

## 2023-05-13 NOTE — Progress Notes (Signed)
[name], [name] ([id number]) [id number] Page 1 of 7 Visit Report for 04/29/2023 Arrival Information Details Patient Name: Date of Service: [name] 04/29/2023 10:15 [name] M Medical Record Number: [id number] Patient Account Number: [id number] Date of Birth/Sex: Treating RN: 04-Oct-1951 (72 y.o. [name] Primary Care [name]: [name] Other Clinician: Referring [name]: Treating [name]: [name] in Treatment: 8 Visit Information History Since Last Visit All ordered tests and consults were completed: Yes Patient Arrived: Ambulatory Added or deleted any medications: No Arrival Time: 10:37 Any new allergies or adverse reactions: No Accompanied By: self Had [name] fall or experienced change in No Transfer Assistance: None activities of daily living that may affect Patient Identification Verified: Yes risk of falls: Secondary Verification Process Completed: Yes Signs or symptoms of abuse/neglect since last visito No Patient Requires Transmission-Based Precautions: No Hospitalized since last visit: No Patient Has Alerts: Yes Implantable device outside of the clinic excluding No Patient Alerts: Patient on Blood Thinner cellular tissue based products placed in the center Plavix since last visit: ABI R [place] Has Dressing in Place as Prescribed: Yes Pain Present Now: No Electronic Signature(s) Signed: 05/13/2023 7:59:22 AM By: [name] Entered By: [name] on 04/29/2023 10:37:51 -------------------------------------------------------------------------------- Encounter Discharge Information Details Patient Name: Date of Service: [name], [name] 04/29/2023 10:15 [name] M Medical Record Number: [id number] Patient Account Number: [id number] Date of Birth/Sex: Treating RN: 1951/03/04 (72 y.o. [name] Primary Care Lorrane Mccay: [name] Other Clinician: Referring [name]: Treating  [name]/Extender: [name] in Treatment: 8 Encounter Discharge Information Items Post Procedure Vitals Discharge Condition: Stable Temperature (F): 98.2 Ambulatory Status: Ambulatory Pulse (bpm): 80 Discharge Destination: Home Respiratory Rate (breaths/min): 18 Transportation: Private Auto Blood Pressure (mmHg): 129/64 Accompanied By: self Schedule Follow-up Appointment: Yes Clinical Summary of Care: Patient Declined Electronic Signature(s) Signed: 05/13/2023 7:59:22 AM By: [name] Entered By: [name] on 04/29/2023 11:17:46 [name], [name] ([id number]) [id number] Page 2 of 7 -------------------------------------------------------------------------------- Lower Extremity Assessment Details Patient Name: Date of Service: [name] 04/29/2023 10:15 [name] M Medical Record Number: [id number] Patient Account Number: [id number] Date of Birth/Sex: Treating RN: April 21, 1951 (72 y.o. [name] Primary Care Atalie Oros: [name] Other Clinician: Referring [name]: Treating [name]/Extender: [name] Weeks in Treatment: 8 Edema Assessment Assessed: [name]: No] [name]: No] Edema: [Left: N] [Right: o] Calf Left: Right: Point of Measurement: 33 cm From Medial Instep 34.5 cm Ankle Left: Right: Point of Measurement: 10 cm From Medial Instep 21.7 cm Vascular Assessment Pulses: Dorsalis Pedis Palpable: [Right:Yes] Electronic Signature(s) Signed: 05/13/2023 7:59:22 AM By: [name] Entered By: [name] on 04/29/2023 10:40:54 -------------------------------------------------------------------------------- Multi Wound Chart Details Patient Name: Date of Service: [name], [name] 04/29/2023 10:15 [name] M Medical Record Number: [id number] Patient Account Number: [id number] Date of Birth/Sex: Treating RN: 05-05-1951 (72 y.o. M) Primary Care [name]: [name] Other  Clinician: Referring [name]: Treating [name]/Extender: [name] Weeks in Treatment: 8 Vital Signs Height(in): 67 Capillary Blood Glucose(mg/dl): 742 Weight(lbs): 595 Pulse(bpm): 67 Body Mass Index(BMI): 24.1 Blood Pressure(mmHg): 157/76 Temperature(F): 97.6 Respiratory Rate(breaths/min): 18 [8:Photos:] [N/[name]:N/[name]] Right T Great oe N/[name] N/[name] Wound Location: Gradually Appeared N/[name] N/[name] Wounding Event: Diabetic Wound/Ulcer of the Lower N/[name] N/[name] Primary Etiology: Extremity Hypertension, Type II Diabetes, N/[name] N/[name] Comorbid History: Osteoarthritis, Neuropathy, Received Radiation 02/04/2023 N/[name] N/[name] Date Acquired: 8 N/[name] N/[name] Weeks of Treatment: Open N/[name] N/[name] Wound Status: No N/[name] N/[name] Wound Recurrence: 1.4x1.8x0.1 N/[name] N/[name] Measurements L x W  x D (cm) 1.979 N/[name] N/[name] [name] (cm) : rea 0.198 N/[name] N/[name] Volume (cm) : 82.90% N/[name] N/[name] % Reduction in [name] rea: 91.40% N/[name] N/[name] % Reduction in Volume: Grade 3 N/[name] N/[name] Classification: Medium N/[name] N/[name] Exudate [name] mount: Serosanguineous N/[name] N/[name] Exudate Type: red, brown N/[name] N/[name] Exudate Color: Distinct, outline attached N/[name] N/[name] Wound Margin: Large (67-100%) N/[name] N/[name] Granulation [name] mount: Red, Hyper-granulation N/[name] N/[name] Granulation Quality: Small (1-33%) N/[name] N/[name] Necrotic [name] mount: Fat Layer (Subcutaneous Tissue): Yes N/[name] N/[name] Exposed Structures: Bone: Yes Fascia: No Tendon: No Muscle: No Joint: No Small (1-33%) N/[name] N/[name] Epithelialization: Debridement - Selective/Open Wound N/[name] N/[name] Debridement: Pre-procedure Verification/Time Out 11:00 N/[name] N/[name] Taken: Lidocaine 4% Topical Solution N/[name] N/[name] Pain Control: Slough N/[name] N/[name] Tissue Debrided: Non-Viable Tissue N/[name] N/[name] Level: 1.98 N/[name] N/[name] Debridement [name] (sq cm): rea Curette N/[name] N/[name] Instrument: Minimum N/[name] N/[name] Bleeding: Pressure N/[name] N/[name] Hemostasis [name] chieved: 0 N/[name] N/[name] Procedural Pain: 0 N/[name] N/[name] Post Procedural Pain: Procedure was tolerated well N/[name] N/[name] Debridement  Treatment Response: 1.4x1.8x0.1 N/[name] N/[name] Post Debridement Measurements L x W x D (cm) 0.198 N/[name] N/[name] Post Debridement Volume: (cm) Callus: No N/[name] N/[name] Periwound Skin Texture: Maceration: No N/[name] N/[name] Periwound Skin Moisture: Dry/Scaly: No No Abnormalities Noted N/[name] N/[name] Periwound Skin Color: No Abnormality N/[name] N/[name] Temperature: Cellular or Tissue Based Product N/[name] N/[name] Procedures Performed: Debridement Treatment Notes Wound #8 (Toe Great) Wound Laterality: Right Cleanser Normal Saline Discharge Instruction: Cleanse the wound with Normal Saline prior to applying [name] clean dressing using gauze sponges, not tissue or cotton balls. Soap and Water Discharge Instruction: May shower and wash wound with dial antibacterial soap and water prior to dressing change. Peri-Wound Care Topical Primary Dressing Epicord Secondary Dressing ADAPTIC TOUCH 3x4.25 in Discharge Instruction: Apply over primary dressing as directed. Woven Gauze Sponges 2x2 in Discharge Instruction: Apply over primary dressing as directed. Secured With Conforming Stretch Gauze Bandage, Sterile 2x75 (in/in) Discharge Instruction: Secure with stretch gauze as directed. [name], [name] ([id number]) [id number] Page 4 of 7 8M Medipore H Soft Cloth Surgical T ape, 4 x 10 (in/yd) Discharge Instruction: Secure with tape as directed. Compression Wrap Compression Stockings Add-Ons Electronic Signature(s) Signed: 04/29/2023 11:42:02 AM By: [name] MD FACS Entered By: [name] on 04/29/2023 11:42:02 -------------------------------------------------------------------------------- Multi-Disciplinary Care Plan Details Patient Name: Date of Service: [name], [name] 04/29/2023 10:15 [name] M Medical Record Number: [id number] Patient Account Number: [id number] Date of Birth/Sex: Treating RN: 1951/07/20 (72 y.o. [name] Primary Care Odean Mcelwain: [name] Other Clinician: Referring  Jordain Radin: Treating Jahanna Raether/Extender: [name] in Treatment: 8 Multidisciplinary Care Plan reviewed with physician Active Inactive HBO Nursing Diagnoses: Anxiety related to knowledge deficit of hyperbaric oxygen therapy and treatment procedures Goals: Patient will tolerate the hyperbaric oxygen therapy treatment Date Initiated: 03/02/2023 Target Resolution Date: 05/27/2023 Goal Status: Active Interventions: Assess for signs and symptoms related to adverse events, including but not limited to confinement anxiety, pneumothorax, oxygen toxicity and baurotrauma Notes: Wound/Skin Impairment Nursing Diagnoses: Impaired tissue integrity Knowledge deficit related to ulceration/compromised skin integrity Goals: Patient/caregiver will verbalize understanding of skin care regimen Date Initiated: 03/17/2023 Target Resolution Date: 04/28/2023 Goal Status: Active Ulcer/skin breakdown will have [name] volume reduction of 30% by week 4 Date Initiated: 03/17/2023 Date Inactivated: 04/02/2023 Target Resolution Date: 03/31/2023 Goal Status: Met Ulcer/skin breakdown will have [name] volume reduction of 50% by week 8 Date Initiated: 04/02/2023 Target Resolution Date: 04/30/2023 Goal Status: Active Interventions: Assess patient/caregiver ability to obtain necessary  supplies Assess patient/caregiver ability to perform ulcer/skin care regimen upon admission and as needed Assess ulceration(s) every visit Treatment Activities: Skin care regimen initiated : 03/17/2023 Topical wound management initiated : 03/17/2023 [name], [name] ([id number]) [id number] Page 5 of 7 Notes: Electronic Signature(s) Signed: 05/13/2023 7:59:22 AM By: [name] Entered By: [name] on 04/29/2023 10:52:45 -------------------------------------------------------------------------------- Pain Assessment Details Patient Name: Date of Service: [name] 04/29/2023 10:15 [name]  M Medical Record Number: [id number] Patient Account Number: [id number] Date of Birth/Sex: Treating RN: [birth year] (72 y.o. [name] Primary Care Gadiel John: [name] Other Clinician: Referring Jaidy Cottam: Treating Kyrese Gartman/Extender: [name] Weeks in Treatment: 8 Active Problems Location of Pain Severity and Description of Pain Patient Has Paino No Site Locations Pain Management and Medication Current Pain Management: Electronic Signature(s) Signed: 05/13/2023 7:59:22 AM By: [name] Entered By: [name] on 04/29/2023 10:38:47 -------------------------------------------------------------------------------- Patient/Caregiver Education Details Patient Name: Date of Service: [name], [name] 7/3/2024andnbsp10:15 [name] M Medical Record Number: [id number] Patient Account Number: [id number] Date of Birth/Gender: Treating RN: 05-06-1951 (72 y.o. [name] Primary Care Physician: [name] Other Clinician: Referring Physician: Treating Physician/Extender: [name] in Treatment: 8 Education Assessment [name], [name] ([id number]) [id number] Page 6 of 7 Education Provided To: Patient Education Topics Provided Wound/Skin Impairment: Methods: Explain/Verbal Responses: State content correctly Electronic Signature(s) Signed: 05/13/2023 7:59:22 AM By: [name] Entered By: [name] on 04/29/2023 10:53:05 -------------------------------------------------------------------------------- Wound Assessment Details Patient Name: Date of Service: [name], [name] 04/29/2023 10:15 [name] M Medical Record Number: [id number] Patient Account Number: [id number] Date of Birth/Sex: Treating RN: April 15, 1951 (72 y.o. [name] Primary Care Hurley Blevins: [name] Other Clinician: Referring Teighlor Korson: Treating Koltyn Kelsay/Extender: [name] Weeks in Treatment:  8 Wound Status Wound Number: 8 Primary Diabetic Wound/Ulcer of the Lower Extremity Etiology: Wound Location: Right T Great oe Wound Open Wounding Event: Gradually Appeared Status: Date Acquired: 02/04/2023 Comorbid Hypertension, Type II Diabetes, Osteoarthritis, Neuropathy, Weeks Of Treatment: 8 History: Received Radiation Clustered Wound: No Photos Wound Measurements Length: (cm) 1.4 Width: (cm) 1.8 Depth: (cm) 0.1 Area: (cm) 1.979 Volume: (cm) 0.198 % Reduction in Area: 82.9% % Reduction in Volume: 91.4% Epithelialization: Small (1-33%) Tunneling: No Undermining: No Wound Description Classification: Grade 3 Wound Margin: Distinct, outline attached Exudate Amount: Medium Exudate Type: Serosanguineous Exudate Color: red, brown Foul Odor After Cleansing: No Slough/Fibrino Yes Wound Bed Granulation Amount: Large (67-100%) Exposed Structure Granulation Quality: Red, Hyper-granulation Fascia Exposed: No Necrotic Amount: Small (1-33%) Fat Layer (Subcutaneous Tissue) Exposed: Yes Necrotic Quality: Adherent Slough Tendon Exposed: No Muscle Exposed: No [name], [name] ([id number]) [id number] Page 7 of 7 Joint Exposed: No Bone Exposed: Yes Periwound Skin Texture Texture Color No Abnormalities Noted: Yes No Abnormalities Noted: Yes Moisture Temperature / Pain No Abnormalities Noted: No Temperature: No Abnormality Dry / Scaly: No Maceration: No Electronic Signature(s) Signed: 05/13/2023 7:59:22 AM By: [name] Entered By: [name] on 04/29/2023 10:50:41 -------------------------------------------------------------------------------- Vitals Details Patient Name: Date of Service: [name], [name] 04/29/2023 10:15 [name] M Medical Record Number: [id number] Patient Account Number: [id number] Date of Birth/Sex: Treating RN: 05/06/1951 (72 y.o. [name] Primary Care Bethsaida Siegenthaler: [name] Other Clinician: Referring  Alyha Marines: Treating Analleli Gierke/Extender: [name] Weeks in Treatment: 8 Vital Signs Time Taken: 10:37 Temperature (F): 97.6 Height (in): 67 Pulse (bpm): 67 Weight (lbs): 154 Respiratory Rate (breaths/min): 18 Body Mass Index (BMI): 24.1 Blood Pressure (mmHg): 157/76 Capillary Blood Glucose (mg/dl): 737 Reference Range: 80 -  120 mg / dl Electronic Signature(s) Signed: 05/13/2023 7:59:22 AM By: [name] Entered By: [name] on 04/29/2023 10:38:38

## 2023-05-13 NOTE — Progress Notes (Signed)
ETTORE, TREBILCOCK ([id number]) [id number] Page 1 of 2 Visit Report for 05/13/2023 Problem List Details Patient Name: Date of Service: [name] 05/13/2023 7:30 [name] M Medical Record Number: [id number] Patient Account Number: [id number] Date of Birth/Sex: Treating RN: June 13, 1951 (72 y.o. 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 M86.171 Other acute osteomyelitis, right ankle and foot 03/02/2023 No Yes E11.621 Type 2 diabetes mellitus with foot ulcer 03/02/2023 No Yes I10 Essential (primary) hypertension 03/02/2023 No Yes N18.32 Chronic kidney disease, stage 3b 03/02/2023 No Yes Inactive Problems Resolved Problems ICD-10 Code Description Active Date Resolved Date L97.512 Non-pressure chronic ulcer of other part of right foot with fat layer 03/09/2023 03/09/2023 exposed Electronic Signature(s) Signed: 05/13/2023 1:38:51 PM By: [name] EMT Signed: 05/13/2023 2:59:48 PM By: [name] MD FACS Entered By: [name] on 05/13/2023 13:38:51 -------------------------------------------------------------------------------- SuperBill Details Patient Name: Date of Service: [name], [name] 05/13/2023 Medical Record Number: [id number] Patient Account Number: [id number] Date of Birth/Sex: Treating RN: 11-06-1950 (72 y.o. [name] [place], [name] ([id number]) [phone].pdf Page 2 of 2 Primary Care Provider: [name] Other Clinician: [name] Referring Provider: Treating Provider/Extender: [name] in Treatment: 10 Diagnosis Coding ICD-10  Codes Code Description [phone] Non-pressure chronic ulcer of other part of right foot with necrosis of bone M86.171 Other acute osteomyelitis, right ankle and foot E11.621 Type 2 diabetes mellitus with foot ulcer I10 Essential (primary) hypertension N18.32 Chronic kidney disease, stage 3b Facility Procedures : CPT4 Code Description: [id number]-(Facility Use Only) HBOT full body chamber, , ICD-10 Diagnosis Description E11.621 Type 2 diabetes mellitus with foot ulcer L97.514 Non-pressure chronic ulcer of other part of right foot wi M86.171 Other acute  osteomyelitis, right ankle and foot Modifier: th necrosis o Quantity: 4 f bone Physician Procedures : CPT4 Code Description Modifier [id number] - WC PHYS HYPERBARIC OXYGEN THERAPY ICD-10 Diagnosis Description E11.621 Type 2 diabetes mellitus with foot ulcer L97.514 Non-pressure chronic ulcer of other part of right foot with necrosis o M86.171 Other  acute osteomyelitis, right ankle and foot Quantity: 1 f bone Electronic Signature(s) Signed: 05/13/2023 1:38:43 PM By: [name] EMT Signed: 05/13/2023 2:59:48 PM By: [name] MD FACS Entered By: [name] on 05/13/2023 13:38:42

## 2023-05-13 NOTE — Progress Notes (Addendum)
Lamar, [name] ([id number]) [id number] Page 1 of 2 Visit Report for 05/13/2023 HBO Details Patient Name: Date of Service: [name] 05/13/2023 7:30 A M Medical Record Number: [id number] Patient Account Number: [id number] Date of Birth/Sex: Treating RN: 1951-09-13 (72 y.o. [name] Primary Care Aqua Denslow: [name] Other Clinician: [name] Referring Isaish Alemu: Treating Rayneisha Bouza/Extender: [name] in Treatment: 10 HBO Treatment Course Details Treatment Course Number: 1 Ordering Brookelin Felber: [name] T Treatments Ordered: otal 40 HBO Treatment Start Date: 04/06/2023 HBO Indication: Diabetic Ulcer(s) of the Lower Extremity Standard/Conservative Wound Care tried and failed greater than or equal to 30 days Wound #8 Right T Great oe HBO Treatment Details Treatment Number: 21 Patient Type: Outpatient Chamber Type: Monoplace Chamber Serial #: [id number] Treatment Protocol: 2.5 ATA with 90 minutes oxygen, with two 5 minute air breaks Treatment Details Compression Rate Down: 2.0 psi / minute De-Compression Rate Up: 2.0 psi / minute A breaks and breathing ir Compress Tx Pressure periods Decompress Decompress Begins Reached (leave unused spaces Begins Ends blank) Chamber Pressure (ATA 1 2.5 2.5 2.5 2.5 2.5 - - 2.5 1 ) Clock Time (24 hr) 08:05 08:18 08:48 08:53 09:23 09:28 - - 09:58 10:09 Treatment Length: 124 (minutes) Treatment Segments: 4 Vital Signs Capillary Blood Glucose Reference Range: 80 - 120 mg / dl HBO Diabetic Blood Glucose Intervention Range: <131 mg/dl or >301 mg/dl Time Vitals Blood Respiratory Capillary Blood Glucose Pulse Action Type: Pulse: Temperature: Taken: Pressure: Rate: Glucose (mg/dl): Meter #: Oximetry (%) Taken: Pre 07:49 184/95 76 18 97.5 205 Post 10:14 206/98 82 18 98.6 254 Treatment Response Treatment Toleration: Well Treatment Completion Status: Treatment  Completed without Adverse Event Treatment Notes After the was removed from the chamber, he stated that his vison was cloudy in both eyes. I asked Dr. Lady Gary to come chamber side to see the patient. He stated this was the second time that this had happen. He stated that the vison cleared up after coming out of the chamber. We requested that he seek an appointment with his ophthalmologist.--JC Additional Procedure Documentation Tissue Sevierity: Necrosis of bone Physician HBO Attestation: I certify that I supervised this HBO treatment in accordance with Medicare guidelines. A trained emergency response team is readily available per Yes hospital policies and procedures. Continue HBOT as ordered. Yes Electronic Signature(s) Signed: 05/13/2023 3:01:34 PM By: [name] MD FACS Previous Signature: 05/13/2023 1:38:10 PM Version By: [name] EMT [name], Kastin ([id number]) [id number] Page 2 of 2 Previous Signature: 05/13/2023 1:38:10 PM Version By: [name] EMT Entered By: [name] on 05/13/2023 15:01:34 -------------------------------------------------------------------------------- HBO Safety Checklist Details Patient Name: Date of Service: [facility] RD, [name] 05/13/2023 7:30 A M Medical Record Number: [id number] Patient Account Number: [id number] Date of Birth/Sex: Treating RN: 02-Jan-1951 (72 y.o. [name] Primary Care Elisia Stepp: [name] Other Clinician: [name] Referring Teneil Shiller: Treating Wade Asebedo/Extender: [name]: 10 HBO Safety Checklist Items Safety Checklist Consent Form Signed Patient voided / foley secured and emptied When did you last eato 0700 Last dose of injectable or oral agent Yesterday Ostomy pouch emptied and vented if applicable NA All implantable devices assessed, documented and approved NA Intravenous access site secured and place NA Valuables  secured Linens and cotton and cotton/polyester blend (less than 51% polyester) Personal oil-based products / skin lotions / body lotions removed Wigs or hairpieces removed NA Smoking or tobacco materials removed Books / newspapers / magazines / loose paper removed Cologne, aftershave, perfume  and deodorant removed Jewelry removed (may wrap wedding band) Make-up removed Hair care products removed Battery operated devices (external) removed Heating patches and chemical warmers removed Titanium eyewear removed NA Nail polish cured greater than 10 hours NA Casting material cured greater than 10 hours NA Hearing aids removed NA Loose dentures or partials removed NA Prosthetics have been removed NA Patient demonstrates correct use of air break device (if applicable) Patient concerns have been addressed Patient grounding bracelet on and cord attached to chamber Specifics for Inpatients (complete in addition to above) Medication sheet sent with patient NA Intravenous medications needed or due during therapy sent with patient NA Drainage tubes (e.g. nasogastric tube or chest tube secured and vented) NA Endotracheal or Tracheotomy tube secured NA Cuff deflated of air and inflated with saline NA Airway suctioned NA Notes The safety checklist was done before the treatment was started. Electronic Signature(s) Signed: 05/13/2023 1:33:14 PM By: [name] EMT Entered By: [name] on 05/13/2023 13:33:14

## 2023-05-13 NOTE — Progress Notes (Signed)
NAFTULA, DONAHUE ([id number]) [id number] Page 1 of 2 Visit Report for 05/13/2023 Arrival Information Details Patient Name: Date of Service: [name] 05/13/2023 7:30 A M Medical Record Number: [id number] Patient Account Number: [id number] Date of Birth/Sex: Treating RN: [birth year] (72 y.o. [name] Primary Care [name]: [name] Other Clinician: Karl [name] Referring Nariya Neumeyer: Treating [name]/Extender: [name] in Treatment: 10 Visit Information History Since Last Visit All ordered tests and consults were completed: Yes Patient Arrived: Ambulatory Added or deleted any medications: No Arrival Time: 07:45 Any new allergies or adverse reactions: No Accompanied By: None Had a fall or experienced change in No Transfer Assistance: None activities of daily living that may affect Patient Identification Verified: Yes risk of falls: Secondary Verification Process Completed: Yes Signs or symptoms of abuse/neglect since last visito No Patient Requires Transmission-Based Precautions: No Hospitalized since last visit: No Patient Has Alerts: Yes Implantable device outside of the clinic excluding No Patient Alerts: Patient on Blood Thinner cellular tissue based products placed in the center Plavix since last visit: ABI R Avella Pain Present Now: No Electronic Signature(s) Signed: 05/13/2023 1:24:11 PM By: [name] EMT Entered By: [name] on 05/13/2023 13:24:11 -------------------------------------------------------------------------------- Encounter Discharge Information Details Patient Name: Date of Service: [name] [name] 05/13/2023 7:30 A M Medical Record Number: [id number] Patient Account Number: [id number] Date of Birth/Sex: Treating RN: Dec 05, 1950 (72 y.o. [name] Primary Care [name]: [name] Other Clinician: Karl [name] Referring [name]: Treating  [name]/Extender: [name] in Treatment: 10 Encounter Discharge Information Items Discharge Condition: Stable Ambulatory Status: Ambulatory Discharge Destination: Home Transportation: Private Auto Accompanied By: None Schedule Follow-up Appointment: Yes Clinical Summary of Care: Electronic Signature(s) Signed: 05/13/2023 1:56:02 PM By: [name] EMT Entered By: [name] on 05/13/2023 13:56:02 [name] ([id number]) [id number] Page 2 of 2 -------------------------------------------------------------------------------- Vitals Details Patient Name: Date of Service: [name] 05/13/2023 7:30 A M Medical Record Number: [id number] Patient Account Number: [id number] Date of Birth/Sex: Treating RN: November 25, 1950 (72 y.o. [name] Primary Care [name]: [name] Other Clinician: Karl [name] Referring [name]: Treating [name]/Extender: [name] Weeks in Treatment: 10 Vital Signs Time Taken: 07:49 Temperature (F): 97.5 Height (in): 67 Pulse (bpm): 76 Weight (lbs): 154 Respiratory Rate (breaths/min): 18 Body Mass Index (BMI): 24.1 Blood Pressure (mmHg): 184/95 Capillary Blood Glucose (mg/dl): 433 Reference Range: 80 - 120 mg / dl Electronic Signature(s) Signed: 05/13/2023 1:32:08 PM By: [name] EMT Entered By: [name] on 05/13/2023 13:32:07

## 2023-05-14 ENCOUNTER — Encounter (HOSPITAL_BASED_OUTPATIENT_CLINIC_OR_DEPARTMENT_OTHER): Payer: Medicare HMO | Admitting: General Surgery

## 2023-05-14 DIAGNOSIS — N1832 Chronic kidney disease, stage 3b: Secondary | ICD-10-CM | POA: Diagnosis not present

## 2023-05-14 DIAGNOSIS — M86171 Other acute osteomyelitis, right ankle and foot: Secondary | ICD-10-CM | POA: Diagnosis not present

## 2023-05-14 DIAGNOSIS — E1122 Type 2 diabetes mellitus with diabetic chronic kidney disease: Secondary | ICD-10-CM | POA: Diagnosis not present

## 2023-05-14 DIAGNOSIS — L97512 Non-pressure chronic ulcer of other part of right foot with fat layer exposed: Secondary | ICD-10-CM | POA: Diagnosis not present

## 2023-05-14 DIAGNOSIS — E11621 Type 2 diabetes mellitus with foot ulcer: Secondary | ICD-10-CM | POA: Diagnosis not present

## 2023-05-14 DIAGNOSIS — L97514 Non-pressure chronic ulcer of other part of right foot with necrosis of bone: Secondary | ICD-10-CM | POA: Diagnosis not present

## 2023-05-14 DIAGNOSIS — I129 Hypertensive chronic kidney disease with stage 1 through stage 4 chronic kidney disease, or unspecified chronic kidney disease: Secondary | ICD-10-CM | POA: Diagnosis not present

## 2023-05-14 LAB — GLUCOSE, CAPILLARY
Glucose-Capillary: 222 mg/dL — ABNORMAL HIGH (ref 70–99)
Glucose-Capillary: 228 mg/dL — ABNORMAL HIGH (ref 70–99)

## 2023-05-14 NOTE — Progress Notes (Signed)
HEBERT, DOOLING ([id number]) [id number] Page 1 of 2 Visit Report for 05/14/2023 Problem List Details Patient Name: Date of Service: [name] 05/14/2023 7:30 A M Medical Record Number: [id number] Patient Account Number: [id number] Date of Birth/Sex: Treating RN: 1951/03/05 (72 y.o. 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 M86.171 Other acute osteomyelitis, right ankle and foot 03/02/2023 No Yes E11.621 Type 2 diabetes mellitus with foot ulcer 03/02/2023 No Yes I10 Essential (primary) hypertension 03/02/2023 No Yes N18.32 Chronic kidney disease, stage 3b 03/02/2023 No Yes Inactive Problems Resolved Problems ICD-10 Code Description Active Date Resolved Date L97.512 Non-pressure chronic ulcer of other part of right foot with fat layer 03/09/2023 03/09/2023 exposed Electronic Signature(s) Signed: 05/14/2023 1:53:23 PM By: [name] EMT Signed: 05/14/2023 4:23:46 PM By: [name] MD FACS Entered By: [name] on 05/14/2023 13:53:22 -------------------------------------------------------------------------------- SuperBill Details Patient Name: Date of Service: [name], [name] 05/14/2023 Medical Record Number: [id number] Patient Account Number: [id number] Date of Birth/Sex: Treating RN: [birth year] (72 y.o. [name], [name] ([id number]) [id number] Page 2 of 2 Primary Care Provider: [name] Other Clinician: [name] Referring Provider: Treating Provider/Extender: [name] in Treatment: 10 Diagnosis Coding ICD-10 Codes Code  Description [phone] Non-pressure chronic ulcer of other part of right foot with necrosis of bone M86.171 Other acute osteomyelitis, right ankle and foot E11.621 Type 2 diabetes mellitus with foot ulcer I10 Essential (primary) hypertension N18.32 Chronic kidney disease, stage 3b Facility Procedures : CPT4 Code Description: [id number] G0277-(Facility Use Only) HBOT full body chamber, , ICD-10 Diagnosis Description E11.621 Type 2 diabetes mellitus with foot ulcer L97.514 Non-pressure chronic ulcer of other part of right foot wi M86.171 Other acute  osteomyelitis, right ankle and foot Modifier: th necrosis o Quantity: 4 f bone Physician Procedures : CPT4 Code Description Modifier [id number] - WC PHYS HYPERBARIC OXYGEN THERAPY ICD-10 Diagnosis Description E11.621 Type 2 diabetes mellitus with foot ulcer L97.514 Non-pressure chronic ulcer of other part of right foot with necrosis o M86.171 Other  acute osteomyelitis, right ankle and foot Quantity: 1 f bone Electronic Signature(s) Signed: 05/14/2023 1:53:18 PM By: [name] EMT Signed: 05/14/2023 4:23:46 PM By: [name] MD FACS Entered By: [name] on 05/14/2023 13:53:17

## 2023-05-14 NOTE — Progress Notes (Signed)
Swede Heaven, [name] ([id number]) [id number] Page 1 of 2 Visit Report for 05/14/2023 HBO Details Patient Name: Date of Service: [name] 05/14/2023 7:30 A M Medical Record Number: [id number] Patient Account Number: [id number] Date of Birth/Sex: Treating RN: May 23, 1951 (72 y.o. [name], [name] Primary Care Jenni Thew: [name] Other Clinician: [name] Referring Genever Hentges: Treating Vipul Cafarelli/Extender: [name] in Treatment: 10 HBO Treatment Course Details Treatment Course Number: 1 Ordering Jacklynn Dehaas: [name] T Treatments Ordered: otal 40 HBO Treatment Start Date: 04/06/2023 HBO Indication: Diabetic Ulcer(s) of the Lower Extremity Standard/Conservative Wound Care tried and failed greater than or equal to 30 days Wound #8 Right T Great oe HBO Treatment Details Treatment Number: 22 Patient Type: Outpatient Chamber Type: Monoplace Chamber Serial #: [id number] Treatment Protocol: 2.5 ATA with 90 minutes oxygen, with two 5 minute air breaks Treatment Details Compression Rate Down: 2.0 psi / minute De-Compression Rate Up: 2.0 psi / minute A breaks and breathing ir Compress Tx Pressure periods Decompress Decompress Begins Reached (leave unused spaces Begins Ends blank) Chamber Pressure (ATA 1 2.5 2.5 2.5 2.5 2.5 - - 2.5 1 ) Clock Time (24 hr) 08:17 08:31 09:01 09:06 09:36 09:41 - - 10:11 10:23 Treatment Length: 126 (minutes) Treatment Segments: 4 Vital Signs Capillary Blood Glucose Reference Range: 80 - 120 mg / dl HBO Diabetic Blood Glucose Intervention Range: <131 mg/dl or >329 mg/dl Time Vitals Blood Respiratory Capillary Blood Glucose Pulse Action Type: Pulse: Temperature: Taken: Pressure: Rate: Glucose (mg/dl): Meter #: Oximetry (%) Taken: Pre 07:56 177/90 69 18 98.1 222 Post 10:29 150/102 78 18 98.4 228 Treatment Response Treatment Toleration: Well Treatment Completion Status: Treatment  Completed without Adverse Event Additional Procedure Documentation Tissue Sevierity: Necrosis of bone Physician HBO Attestation: I certify that I supervised this HBO treatment in accordance with Medicare guidelines. A trained emergency response team is readily available per Yes hospital policies and procedures. Continue HBOT as ordered. Yes Electronic Signature(s) Signed: 05/14/2023 4:25:50 PM By: [name] MD FACS Previous Signature: 05/14/2023 1:52:56 PM Version By: [name] EMT Entered By: [name] on 05/14/2023 16:25:49 Halperin, [name] ([id number]) [id number] Page 2 of 2 -------------------------------------------------------------------------------- HBO Safety Checklist Details Patient Name: Date of Service: [name] 05/14/2023 7:30 A M Medical Record Number: [id number] Patient Account Number: [id number] Date of Birth/Sex: Treating RN: 08-25-1951 (72 y.o. [name], [name] Primary Care Caley Volkert: [name] Other Clinician: [name] Referring Janyce Ellinger: Treating Allison Deshotels/Extender: [name]: 10 HBO Safety Checklist Items Safety Checklist Consent Form Signed Patient voided / foley secured and emptied When did you last eato 0700 Last dose of injectable or oral agent Yesterday Ostomy pouch emptied and vented if applicable NA All implantable devices assessed, documented and approved NA Intravenous access site secured and place NA Valuables secured Linens and cotton and cotton/polyester blend (less than 51% polyester) Personal oil-based products / skin lotions / body lotions removed Wigs or hairpieces removed NA Smoking or tobacco materials removed Books / newspapers / magazines / loose paper removed Cologne, aftershave, perfume and deodorant removed Jewelry removed (may wrap wedding band) Make-up removed NA Hair care products removed Battery operated devices (external)  removed Heating patches and chemical warmers removed Titanium eyewear removed NA Nail polish cured greater than 10 hours NA Casting material cured greater than 10 hours NA Hearing aids removed NA Loose dentures or partials removed removed by patient Prosthetics have been removed NA Patient demonstrates correct use of air break device (if applicable) Patient  concerns have been addressed Patient grounding bracelet on and cord attached to chamber Specifics for Inpatients (complete in addition to above) Medication sheet sent with patient NA Intravenous medications needed or due during therapy sent with patient NA Drainage tubes (e.g. nasogastric tube or chest tube secured and vented) NA Endotracheal or Tracheotomy tube secured NA Cuff deflated of air and inflated with saline NA Airway suctioned NA Notes The safety checklist was done before the treatment was started. Electronic Signature(s) Signed: 05/14/2023 1:51:40 PM By: [name] EMT Entered By: [name] on 05/14/2023 13:51:40

## 2023-05-14 NOTE — Progress Notes (Signed)
AMADU, SCHLAGETER ([id number]) [id number] Page 1 of 2 Visit Report for 05/14/2023 Arrival Information Details Patient Name: Date of Service: [name] 05/14/2023 7:30 [name] M Medical Record Number: [id number] Patient Account Number: [id number] Date of Birth/Sex: Treating RN: [birth year] (72 y.o. [name], [name] Primary Care Taquanna Borras: [name] Other Clinician: [name] Referring Lennie Dunnigan: Treating Lacoya Wilbanks/Extender: [name] in Treatment: 10 Visit Information History Since Last Visit All ordered tests and consults were completed: Yes Patient Arrived: Ambulatory Added or deleted any medications: No Arrival Time: 07:45 Any new allergies or adverse reactions: No Accompanied By: None Had [name] fall or experienced change in No Transfer Assistance: None activities of daily living that may affect Patient Identification Verified: Yes risk of falls: Secondary Verification Process Completed: Yes Signs or symptoms of abuse/neglect since last visito No Patient Requires Transmission-Based Precautions: No Hospitalized since last visit: No Patient Has Alerts: Yes Implantable device outside of the clinic excluding No Patient Alerts: Patient on Blood Thinner cellular tissue based products placed in the center Plavix since last visit: ABI R [place] Pain Present Now: No Electronic Signature(s) Signed: 05/14/2023 1:43:40 PM By: [name] EMT Entered By: [name] on 05/14/2023 13:43:40 -------------------------------------------------------------------------------- Encounter Discharge Information Details Patient Name: Date of Service: [name], [name] 05/14/2023 7:30 [name] M Medical Record Number: [id number] Patient Account Number: [id number] Date of Birth/Sex: Treating RN: [birth year] (72 y.o. [name], [name] Primary Care Jozy Mcphearson: [name] Other Clinician: [name] Referring See Beharry: Treating Chala Gul/Extender:  [name] in Treatment: 10 Encounter Discharge Information Items Discharge Condition: Stable Ambulatory Status: Ambulatory Discharge Destination: Home Transportation: Private Auto Accompanied By: None Schedule Follow-up Appointment: Yes Clinical Summary of Care: Electronic Signature(s) Signed: 05/14/2023 1:53:53 PM By: [name] EMT Entered By: [name] on 05/14/2023 13:53:53 [name], [name] ([id number]) [id number] Page 2 of 2 -------------------------------------------------------------------------------- Vitals Details Patient Name: Date of Service: [name] 05/14/2023 7:30 [name] M Medical Record Number: [id number] Patient Account Number: [id number] Date of Birth/Sex: Treating RN: [birth year] (72 y.o. [name], [name] Primary Care Shatira Dobosz: [name] Other Clinician: [name] Referring Abu Heavin: Treating Namiyah Grantham/Extender: [name]: 10 Vital Signs Time Taken: 07:56 Temperature (F): 98.1 Height (in): 67 Pulse (bpm): 69 Weight (lbs): 154 Respiratory Rate (breaths/min): 18 Body Mass Index (BMI): 24.1 Blood Pressure (mmHg): 177/90 Capillary Blood Glucose (mg/dl): 053 Reference Range: 80 - 120 mg / dl Electronic Signature(s) Signed: 05/14/2023 1:50:27 PM By: [name] EMT Entered By: [name] on 05/14/2023 13:50:27

## 2023-05-14 NOTE — Progress Notes (Signed)
[name], [name] ([id number]) [id number] Page 1 of 7 Visit Report for 05/14/2023 Arrival Information Details Patient Name: Date of Service: [name] 05/14/2023 10:00 A M Medical Record Number: [id number] Patient Account Number: [id number] Date of Birth/Sex: Treating RN: [birth year] (72 y.o. [name] Primary Care Eugenie Harewood: [name] Other Clinician: Referring Jhania Etherington: Treating Terrie Grajales/Extender: [name] in Treatment: 10 Visit Information History Since Last Visit Added or deleted any medications: No Patient Arrived: Ambulatory Any new allergies or adverse reactions: No Arrival Time: 10:30 Had a fall or experienced change in No Accompanied By: self activities of daily living that may affect Transfer Assistance: None risk of falls: Patient Identification Verified: Yes Signs or symptoms of abuse/neglect since last visito No Secondary Verification Process Completed: Yes Hospitalized since last visit: No Patient Requires Transmission-Based Precautions: No Implantable device outside of the clinic excluding No Patient Has Alerts: Yes cellular tissue based products placed in the center Patient Alerts: Patient on Blood Thinner since last visit: Plavix Has Dressing in Place as Prescribed: Yes ABI R [place] Pain Present Now: No Electronic Signature(s) Signed: 05/14/2023 3:55:30 PM By: Samuella Bruin Entered By: Samuella Bruin on 05/14/2023 10:30:38 -------------------------------------------------------------------------------- Encounter Discharge Information Details Patient Name: Date of Service: [name] 05/14/2023 10:00 A M Medical Record Number: [id number] Patient Account Number: [id number] Date of Birth/Sex: Treating RN: 10-31-1950 (72 y.o. [name] Primary Care Aleynah Rocchio: [name] Other Clinician: Referring Evolett Somarriba: Treating Catheleen Langhorne/Extender: [name] in Treatment: 10 Encounter Discharge Information Items Post Procedure Vitals Discharge Condition: Stable Temperature (F): 98.4 Ambulatory Status: Ambulatory Pulse (bpm): 78 Discharge Destination: Home Respiratory Rate (breaths/min): 18 Transportation: Private Auto Blood Pressure (mmHg): 150/102 Accompanied By: self Schedule Follow-up Appointment: Yes Clinical Summary of Care: Patient Declined Electronic Signature(s) Signed: 05/14/2023 3:55:30 PM By: Samuella Bruin Entered By: Samuella Bruin on 05/14/2023 10:53:02 Reffner, [name] ([id number]) [id number] Page 2 of 7 -------------------------------------------------------------------------------- Lower Extremity Assessment Details Patient Name: Date of Service: [name] 05/14/2023 10:00 A M Medical Record Number: [id number] Patient Account Number: [id number] Date of Birth/Sex: Treating RN: 23-Jan-1951 (72 y.o. [name] Primary Care Sofiah Lyne: [name] Other Clinician: Referring Shanya Ferriss: Treating Summer Mccolgan/Extender: [name]: 10 Edema Assessment Assessed: Kyra Searles: No] Franne Forts: No] Edema: [Left: N] [Right: o] Calf Left: Right: Point of Measurement: 33 cm From Medial Instep 34.5 cm Ankle Left: Right: Point of Measurement: 10 cm From Medial Instep 21.7 cm Electronic Signature(s) Signed: 05/14/2023 3:55:30 PM By: Samuella Bruin Entered By: Samuella Bruin on 05/14/2023 10:30:52 -------------------------------------------------------------------------------- Multi Wound Chart Details Patient Name: Date of Service: [name] 05/14/2023 10:00 A M Medical Record Number: [id number] Patient Account Number: [id number] Date of Birth/Sex: Treating RN: 08/18/1951 (72 y.o. M) Primary Care Danyl Deems: [name] Other Clinician: Referring Norman Bier: Treating Keylani Perlstein/Extender: [name]: 10 Vital Signs Height(in): 67 Capillary Blood Glucose(mg/dl): 242 Weight(lbs): 353 Pulse(bpm): 78 Body Mass Index(BMI): 24.1 Blood Pressure(mmHg): 150/102 Temperature(F): 98.4 Respiratory Rate(breaths/min): 18 [8:Photos:] [N/A:N/A] Right T Great oe N/A N/A Wound Location: Gradually Appeared N/A N/A Wounding Event: Diabetic Wound/Ulcer of the Lower N/A N/A Primary Etiology: Extremity Hypertension, Type II Diabetes, N/A N/A Comorbid History: Osteoarthritis, Neuropathy, Kamren Heintzelman, [name] ([id number]) [id number] Page 3 of 7 Radiation 02/04/2023 N/A N/A Date Acquired: 10 N/A N/A Weeks of Treatment: Open N/A N/A Wound Status: No N/A N/A Wound Recurrence: 0.5x1.2x0.2 N/A N/A Measurements L x W x D (cm) 0.471 N/A N/A A (  cm) : rea 0.094 N/A N/A Volume (cm) : 95.90% N/A N/A % Reduction in A rea: 95.90% N/A N/A % Reduction in Volume: Grade 3 N/A N/A Classification: Medium N/A N/A Exudate A mount: Serosanguineous N/A N/A Exudate Type: red, brown N/A N/A Exudate Color: Distinct, outline attached N/A N/A Wound Margin: Large (67-100%) N/A N/A Granulation A mount: Red N/A N/A Granulation Quality: Small (1-33%) N/A N/A Necrotic A mount: Fat Layer (Subcutaneous Tissue): Yes N/A N/A Exposed Structures: Fascia: No Tendon: No Muscle: No Joint: No Bone: No Medium (34-66%) N/A N/A Epithelialization: Debridement - Selective/Open Wound N/A N/A Debridement: Pre-procedure Verification/Time Out 10:47 N/A N/A Taken: Lidocaine 4% T opical Solution N/A N/A Pain Control: Callus, Slough N/A N/A Tissue Debrided: Skin/Epidermis N/A N/A Level: 0.47 N/A N/A Debridement A (sq cm): rea Curette N/A N/A Instrument: Minimum N/A N/A Bleeding: Pressure N/A N/A Hemostasis A chieved: Procedure was tolerated well N/A N/A Debridement Treatment Response: 0.5x1.2x0.2 N/A N/A Post Debridement Measurements L x W x D  (cm) 0.094 N/A N/A Post Debridement Volume: (cm) Callus: No N/A N/A Periwound Skin Texture: Maceration: No N/A N/A Periwound Skin Moisture: Dry/Scaly: No No Abnormalities Noted N/A N/A Periwound Skin Color: No Abnormality N/A N/A Temperature: Cellular or Tissue Based Product N/A N/A Procedures Performed: Debridement Treatment Notes Wound #8 (Toe Great) Wound Laterality: Right Cleanser Normal Saline Discharge Instruction: Cleanse the wound with Normal Saline prior to applying a clean dressing using gauze sponges, not tissue or cotton balls. Soap and Water Discharge Instruction: May shower and wash wound with dial antibacterial soap and water prior to dressing change. Peri-Wound Care Topical Primary Dressing Epicord Secondary Dressing ADAPTIC TOUCH 3x4.25 in Discharge Instruction: Apply over primary dressing as directed. Woven Gauze Sponges 2x2 in Discharge Instruction: Apply over primary dressing as directed. Secured With Conforming Stretch Gauze Bandage, Sterile 2x75 (in/in) Discharge Instruction: Secure with stretch gauze as directed. 73M Medipore H Soft Cloth Surgical T ape, 4 x 10 (in/yd) Discharge Instruction: Secure with tape as directed. Compression Wrap Compression Stockings Add-Ons QUSAI, KEM ([id number]) [id number] Page 4 of 7 Electronic Signature(s) Signed: 05/14/2023 11:00:29 AM By: [name] MD FACS Entered By: [name] on 05/14/2023 11:00:29 -------------------------------------------------------------------------------- Multi-Disciplinary Care Plan Details Patient Name: Date of Service: [name] 05/14/2023 10:00 A M Medical Record Number: [id number] Patient Account Number: [id number] Date of Birth/Sex: Treating RN: Jan 10, 1951 (72 y.o. [name] Primary Care Editha Bridgeforth: [name] Other Clinician: Referring Dung Prien: Treating Aziza Stuckert/Extender: [name] in  Treatment: 10 Multidisciplinary Care Plan reviewed with physician Active Inactive HBO Nursing Diagnoses: Anxiety related to knowledge deficit of hyperbaric oxygen therapy and treatment procedures Goals: Patient will tolerate the hyperbaric oxygen therapy treatment Date Initiated: 03/02/2023 Target Resolution Date: 05/27/2023 Goal Status: Active Interventions: Assess for signs and symptoms related to adverse events, including but not limited to confinement anxiety, pneumothorax, oxygen toxicity and baurotrauma Notes: Wound/Skin Impairment Nursing Diagnoses: Impaired tissue integrity Knowledge deficit related to ulceration/compromised skin integrity Goals: Patient/caregiver will verbalize understanding of skin care regimen Date Initiated: 03/17/2023 Target Resolution Date: 05/29/2023 Goal Status: Active Ulcer/skin breakdown will have a volume reduction of 30% by week 4 Date Initiated: 03/17/2023 Date Inactivated: 04/02/2023 Target Resolution Date: 03/31/2023 Goal Status: Met Ulcer/skin breakdown will have a volume reduction of 50% by week 8 Date Initiated: 04/02/2023 Date Inactivated: 05/07/2023 Target Resolution Date: 04/30/2023 Goal Status: Met Interventions: Assess patient/caregiver ability to obtain necessary supplies Assess patient/caregiver ability to perform ulcer/skin care regimen upon admission and as needed Assess  ulceration(s) every visit Treatment Activities: Skin care regimen initiated : 03/17/2023 Topical wound management initiated : 03/17/2023 Notes: Electronic Signature(s) Signed: 05/14/2023 3:55:30 PM By: Samuella Bruin Entered By: Samuella Bruin on 05/14/2023 10:31:14 [name], [name] ([id number]) [id number] Page 5 of 7 -------------------------------------------------------------------------------- Pain Assessment Details Patient Name: Date of Service: [name] 05/14/2023 10:00 A M Medical Record Number: [id number] Patient  Account Number: [id number] Date of Birth/Sex: Treating RN: Feb 16, 1951 (72 y.o. [name] Primary Care Goldye Tourangeau: [name] Other Clinician: Referring Kahla Risdon: Treating Shloima Clinch/Extender: [name]: 10 Active Problems Location of Pain Severity and Description of Pain Patient Has Paino No Site Locations Rate the pain. Current Pain Level: 0 Pain Management and Medication Current Pain Management: Electronic Signature(s) Signed: 05/14/2023 3:55:30 PM By: Samuella Bruin Entered By: Samuella Bruin on 05/14/2023 10:30:48 -------------------------------------------------------------------------------- Patient/Caregiver Education Details Patient Name: Date of Service: [name] [name] RD, [name] 7/18/2024andnbsp10:00 A M Medical Record Number: [id number] Patient Account Number: [id number] Date of Birth/Gender: Treating RN: 1951-09-03 (72 y.o. [name] Primary Care Physician: [name] Other Clinician: Referring Physician: Treating Physician/Extender: [name] in Treatment: 10 Education Assessment Education Provided To: Patient Education Topics Provided Wound/Skin Impairment: Methods: Explain/Verbal Responses: Reinforcements needed, State content correctly [place], [name] ([id number]) [id number] Page 6 of 7 Electronic Signature(s) Signed: 05/14/2023 3:55:30 PM By: Samuella Bruin Entered By: Samuella Bruin on 05/14/2023 10:31:24 -------------------------------------------------------------------------------- Wound Assessment Details Patient Name: Date of Service: [name] 05/14/2023 10:00 A M Medical Record Number: [id number] Patient Account Number: [id number] Date of Birth/Sex: Treating RN: 11-11-1950 (72 y.o. M) Primary Care Armella Stogner: [name] Other Clinician: Referring Aries Townley: Treating Romulo Okray/Extender: [name]: 10 Wound Status Wound Number: 8 Primary Diabetic Wound/Ulcer of the Lower Extremity Etiology: Wound Location: Right T Great oe Wound Open Wounding Event: Gradually Appeared Status: Date Acquired: 02/04/2023 Comorbid Hypertension, Type II Diabetes, Osteoarthritis, Neuropathy, Weeks Of Treatment: 10 History: Received Radiation Clustered Wound: No Photos Wound Measurements Length: (cm) 0.5 Width: (cm) 1.2 Depth: (cm) 0.2 Area: (cm) 0.471 Volume: (cm) 0.094 % Reduction in Area: 95.9% % Reduction in Volume: 95.9% Epithelialization: Medium (34-66%) Tunneling: No Undermining: No Wound Description Classification: Grade 3 Wound Margin: Distinct, outline attached Exudate Amount: Medium Exudate Type: Serosanguineous Exudate Color: red, brown Foul Odor After Cleansing: No Slough/Fibrino Yes Wound Bed Granulation Amount: Large (67-100%) Exposed Structure Granulation Quality: Red Fascia Exposed: No Necrotic Amount: Small (1-33%) Fat Layer (Subcutaneous Tissue) Exposed: Yes Necrotic Quality: Adherent Slough Tendon Exposed: No Muscle Exposed: No Joint Exposed: No Bone Exposed: No Periwound Skin Texture Texture Color No Abnormalities Noted: Yes No Abnormalities Noted: Yes Moisture Temperature / Pain Horkey, Jafar ([id number]) [id number] Page 7 of 7 No Abnormalities Noted: No Temperature: No Abnormality Dry / Scaly: No Maceration: No Treatment Notes Wound #8 (Toe Great) Wound Laterality: Right Cleanser Normal Saline Discharge Instruction: Cleanse the wound with Normal Saline prior to applying a clean dressing using gauze sponges, not tissue or cotton balls. Soap and Water Discharge Instruction: May shower and wash wound with dial antibacterial soap and water prior to dressing change. Peri-Wound Care Topical Primary Dressing Epicord Secondary Dressing ADAPTIC TOUCH 3x4.25 in Discharge Instruction: Apply over  primary dressing as directed. Woven Gauze Sponges 2x2 in Discharge Instruction: Apply over primary dressing as directed. Secured With Conforming Stretch Gauze Bandage, Sterile 2x75 (in/in) Discharge Instruction: Secure with stretch gauze as directed. 54M Medipore H Soft Cloth Surgical T ape, 4 x 10 (in/yd) Discharge Instruction:  Secure with tape as directed. Compression Wrap Compression Stockings Add-Ons Electronic Signature(s) Signed: 05/14/2023 1:47:42 PM By: Dayton Scrape Entered By: Dayton Scrape on 05/14/2023 10:36:12 -------------------------------------------------------------------------------- Vitals Details Patient Name: Date of Service: [name] 05/14/2023 10:00 A M Medical Record Number: [id number] Patient Account Number: [id number] Date of Birth/Sex: Treating RN: Nov 17, 1950 (72 y.o. M) Primary Care Biridiana Twardowski: [name] Other Clinician: Referring Johnchristopher Sarvis: Treating Kylen Schliep/Extender: [name]: 10 Vital Signs Time Taken: 10:32 Temperature (F): 98.4 Height (in): 67 Pulse (bpm): 78 Weight (lbs): 154 Respiratory Rate (breaths/min): 18 Body Mass Index (BMI): 24.1 Blood Pressure (mmHg): 150/102 Capillary Blood Glucose (mg/dl): 132 Reference Range: 80 - 120 mg / dl Electronic Signature(s) Signed: 05/14/2023 1:47:42 PM By: Dayton Scrape Entered By: Dayton Scrape on 05/14/2023 10:33:31

## 2023-05-15 ENCOUNTER — Encounter (HOSPITAL_BASED_OUTPATIENT_CLINIC_OR_DEPARTMENT_OTHER): Payer: Medicare HMO | Admitting: General Surgery

## 2023-05-15 DIAGNOSIS — N1832 Chronic kidney disease, stage 3b: Secondary | ICD-10-CM | POA: Diagnosis not present

## 2023-05-15 DIAGNOSIS — M86171 Other acute osteomyelitis, right ankle and foot: Secondary | ICD-10-CM | POA: Diagnosis not present

## 2023-05-15 DIAGNOSIS — E11621 Type 2 diabetes mellitus with foot ulcer: Secondary | ICD-10-CM | POA: Diagnosis not present

## 2023-05-15 DIAGNOSIS — I129 Hypertensive chronic kidney disease with stage 1 through stage 4 chronic kidney disease, or unspecified chronic kidney disease: Secondary | ICD-10-CM | POA: Diagnosis not present

## 2023-05-15 DIAGNOSIS — L97514 Non-pressure chronic ulcer of other part of right foot with necrosis of bone: Secondary | ICD-10-CM | POA: Diagnosis not present

## 2023-05-15 DIAGNOSIS — E1122 Type 2 diabetes mellitus with diabetic chronic kidney disease: Secondary | ICD-10-CM | POA: Diagnosis not present

## 2023-05-15 LAB — GLUCOSE, CAPILLARY
Glucose-Capillary: 209 mg/dL — ABNORMAL HIGH (ref 70–99)
Glucose-Capillary: 210 mg/dL — ABNORMAL HIGH (ref 70–99)

## 2023-05-16 NOTE — Progress Notes (Signed)
[name], [name] ([id number]) [id number] Page 1 of 2 Visit Report for 05/15/2023 Arrival Information Details Patient Name: Date of Service: [name] 05/15/2023 7:30 A M Medical Record Number: [id number] Patient Account Number: [id number] Date of Birth/Sex: Treating RN: 06/27/1951 (72 y.o. [name] Primary Care Adiva Boettner: [name] Other Clinician: Haywood Pao Referring Deepika [place]: Treating Anquan Azzarello/Extender: [name] in Treatment: 10 Visit Information History Since Last Visit All ordered tests and consults were completed: Yes Patient Arrived: Ambulatory Added or deleted any medications: No Arrival Time: 07:45 Any new allergies or adverse reactions: No Accompanied By: self Had a fall or experienced change in No Transfer Assistance: None activities of daily living that may affect Patient Identification Verified: Yes risk of falls: Secondary Verification Process Completed: Yes Signs or symptoms of abuse/neglect since last visito No Patient Requires Transmission-Based Precautions: No Hospitalized since last visit: No Patient Has Alerts: Yes Implantable device outside of the clinic excluding No Patient Alerts: Patient on Blood Thinner cellular tissue based products placed in the center Plavix since last visit: ABI R [place] Pain Present Now: No Electronic Signature(s) Signed: 05/15/2023 3:19:54 PM By: Haywood Pao CHT EMT BS , , Entered By: Haywood Pao on 05/15/2023 15:19:54 -------------------------------------------------------------------------------- Encounter Discharge Information Details Patient Name: Date of Service: [name] RD, [name] 05/15/2023 7:30 A M Medical Record Number: [id number] Patient Account Number: [id number] Date of Birth/Sex: Treating RN: Jan 18, 1951 (72 y.o. [name] Primary Care Fredda Clarida: [name] Other Clinician: [name] Referring  Boyd [place]: Treating Meryem Haertel/Extender: [name] in Treatment: 10 Encounter Discharge Information Items Discharge Condition: Stable Ambulatory Status: Ambulatory Discharge Destination: Home Transportation: Private Auto Accompanied By: None Schedule Follow-up Appointment: Yes Clinical Summary of Care: Notes Delay Documation due to computer system down. Electronic Signature(s) Signed: 05/18/2023 12:38:29 PM By: [name] EMT Entered By: [name] on 05/18/2023 12:38:28 [name] ([id number]) [id number] Page 2 of 2 -------------------------------------------------------------------------------- Vitals Details Patient Name: Date of Service: [name] 05/15/2023 7:30 A M Medical Record Number: [id number] Patient Account Number: [id number] Date of Birth/Sex: Treating RN: [birth year] (72 y.o. [name] Primary Care Vaishnavi Dalby: [name] Other Clinician: Haywood Pao Referring Jayron Maqueda: Treating Brighid Koch/Extender: [name] in Treatment: 10 Vital Signs Time Taken: 08:00 Temperature (F): 97.8 Height (in): 67 Pulse (bpm): 65 Weight (lbs): 154 Respiratory Rate (breaths/min): 18 Body Mass Index (BMI): 24.1 Blood Pressure (mmHg): 131/79 Capillary Blood Glucose (mg/dl): 614 Reference Range: 80 - 120 mg / dl Electronic Signature(s) Signed: 05/15/2023 3:20:18 PM By: Haywood Pao CHT EMT BS , , Entered By: Haywood Pao on 05/15/2023 15:20:18

## 2023-05-18 ENCOUNTER — Encounter (HOSPITAL_BASED_OUTPATIENT_CLINIC_OR_DEPARTMENT_OTHER): Payer: Medicare HMO | Admitting: General Surgery

## 2023-05-18 NOTE — Progress Notes (Signed)
SHAQUIL, ALDANA ([id number]) [id number] Page 1 of 2 Visit Report for 05/15/2023 Problem List Details Patient Name: Date of Service: [name] 05/15/2023 7:30 A M Medical Record Number: [id number] Patient Account Number: [id number] Date of Birth/Sex: Treating RN: 01-Apr-1951 (72 y.o. 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 Electronic Signature(s) Signed: 05/18/2023 12:37:49 PM By: [name] EMT Signed: 05/18/2023 12:39:00 PM By: [name] MD FACS Entered By: [name] on 05/18/2023 12:37:49 SuperBill Details -------------------------------------------------------------------------------- [name] ([id number]) [id number] Page 2 of 2 Patient Name: Date of Service: [name] 05/15/2023 Medical Record Number: [id number] Patient Account Number: [id number] Date of Birth/Sex: Treating RN: 08/19/1951 (72 y.o. [name] Primary Care Provider: [name] Other Clinician: [name] Referring Provider: Treating Provider/Extender: [name] in  Treatment: 10 Diagnosis Coding ICD-10 Codes Code Description [phone] Non-pressure chronic ulcer of other part of right foot with necrosis of bone M86.171 Other acute osteomyelitis, right ankle and foot E11.621 Type 2 diabetes mellitus with foot ulcer I10 Essential (primary) hypertension N18.32 Chronic kidney disease, stage 3b Facility Procedures CPT4 Code Description Modifier Quantity [id number]-(Facility Use Only) HBOT full body chamber, , 4 ICD-10 Diagnosis Description E11.621 Type 2 diabetes mellitus with foot ulcer L97.514 Non-pressure chronic ulcer of other part of right foot with necrosis of bone M86.171 Other acute osteomyelitis, right ankle and foot Physician Procedures Quantity CPT4 Code Description Modifier [id number] - WC PHYS HYPERBARIC OXYGEN THERAPY 1 ICD-10 Diagnosis Description E11.621 Type 2 diabetes mellitus with foot ulcer L97.514 Non-pressure chronic ulcer of other part of right foot with necrosis of bone M86.171 Other acute osteomyelitis, right ankle and foot Notes Delay Documation due to computer system down. Electronic Signature(s) Signed: 05/18/2023 12:34:21 PM By: [name] EMT Signed: 05/18/2023 12:39:00 PM By: [name] MD FACS Entered By: [name] on 05/18/2023 12:34:20

## 2023-05-18 NOTE — Progress Notes (Signed)
[name], [name] ([id number]) [id number] Page 1 of 2 Visit Report for 05/15/2023 HBO Details Patient Name: Date of Service: [name] 05/15/2023 7:30 A M Medical Record Number: [id number] Patient Account Number: [id number] Date of Birth/Sex: Treating RN: November 29, 1950 (72 y.o. [name] Primary Care [name]: [name] Other Clinician: Karl [name] Referring [name]: Treating [name]/Extender: [name] in Treatment: 10 HBO Treatment Course Details Treatment Course Number: 1 Ordering [name]: [name] T Treatments Ordered: otal 40 HBO Treatment Start Date: 04/06/2023 HBO Indication: Diabetic Ulcer(s) of the Lower Extremity Standard/Conservative Wound Care tried and failed greater than or equal to 30 days Wound #8 Right T Great oe HBO Treatment Details Treatment Number: 23 Patient Type: Outpatient Chamber Type: Monoplace Chamber Serial #: [id number] Treatment Protocol: 2.5 ATA with 90 minutes oxygen, with two 5 minute air breaks Treatment Details Compression Rate Down: 2.0 psi / minute De-Compression Rate Up: 2.0 psi / minute A breaks and breathing ir Compress Tx Pressure periods Decompress Decompress Begins Reached (leave unused spaces Begins Ends blank) Chamber Pressure (ATA 1 2.5 2.5 2.5 2.5 2.5 - - 2.5 1 ) Clock Time (24 hr) 08:15 08:26 08:56 09:01 09:31 09:36 - - 10:06 10:18 Treatment Length: 123 (minutes) Treatment Segments: 4 Vital Signs Capillary Blood Glucose Reference Range: 80 - 120 mg / dl HBO Diabetic Blood Glucose Intervention Range: <131 mg/dl or >324 mg/dl Time Vitals Blood Respiratory Capillary Blood Glucose Pulse Action Type: Pulse: Temperature: Taken: Pressure: Rate: Glucose (mg/dl): Meter #: Oximetry (%) Taken: Pre 08:00 131/79 65 18 97.8 209 Post 10:22 187/99 78 18 98.1 210 Treatment Response Treatment Toleration: Well Treatment Completion Status: Treatment  Completed without Adverse Event Treatment Notes The patient was given 8 oz of Glucerna to drink before the treatment was started per orders. Delay Documation due to computer system down. Additional Procedure Documentation Tissue Sevierity: Necrosis of bone Physician HBO Attestation: I certify that I supervised this HBO treatment in accordance with Medicare guidelines. A trained emergency response team is readily available per Yes hospital policies and procedures. Continue HBOT as ordered. Yes Electronic Signature(s) Signed: 05/18/2023 12:39:24 PM By: [name] Guess MD FACS Previous Signature: 05/18/2023 12:33:49 PM Version By: [name] EMT Entered By: [name] on 05/18/2023 12:39:24 [name], [name] ([id number]) [id number] Page 2 of 2 -------------------------------------------------------------------------------- HBO Safety Checklist Details Patient Name: Date of Service: [name] 05/15/2023 7:30 A M Medical Record Number: [id number] Patient Account Number: [id number] Date of Birth/Sex: Treating RN: Oct 01, 1951 (72 y.o. [name] Primary Care [name]: [name] Other Clinician: Karl [name] Referring [name]: Treating [name]/Extender: [name] Weeks in Treatment: 10 HBO Safety Checklist Items Safety Checklist Consent Form Signed Patient voided / foley secured and emptied When did you last eato 0700 Last dose of injectable or oral agent Yesterday Ostomy pouch emptied and vented if applicable NA All implantable devices assessed, documented and approved NA Intravenous access site secured and place NA Valuables secured Linens and cotton and cotton/polyester blend (less than 51% polyester) Personal oil-based products / skin lotions / body lotions removed Wigs or hairpieces removed NA Smoking or tobacco materials removed Books / newspapers / magazines / loose paper removed Cologne,  aftershave, perfume and deodorant removed Jewelry removed (may wrap wedding band) Make-up removed NA Hair care products removed Battery operated devices (external) removed Heating patches and chemical warmers removed Titanium eyewear removed NA Nail polish cured greater than 10 hours NA Casting material cured greater than 10 hours NA Hearing aids  removed NA Loose dentures or partials removed removed by patient Prosthetics have been removed NA Patient demonstrates correct use of air break device (if applicable) Patient concerns have been addressed Patient grounding bracelet on and cord attached to chamber Specifics for Inpatients (complete in addition to above) Medication sheet sent with patient NA Intravenous medications needed or due during therapy sent with patient NA Drainage tubes (e.g. nasogastric tube or chest tube secured and vented) NA Endotracheal or Tracheotomy tube secured NA Cuff deflated of air and inflated with saline NA Airway suctioned NA Notes The safety checklist was done before the treatment was started. Delay Documation due to computer system down. Electronic Signature(s) Signed: 05/18/2023 12:29:20 PM By: [name] EMT Entered By: [name] on 05/18/2023 12:29:19

## 2023-05-19 ENCOUNTER — Encounter (HOSPITAL_BASED_OUTPATIENT_CLINIC_OR_DEPARTMENT_OTHER): Payer: Medicare HMO | Admitting: General Surgery

## 2023-05-20 ENCOUNTER — Encounter (HOSPITAL_BASED_OUTPATIENT_CLINIC_OR_DEPARTMENT_OTHER): Payer: Medicare HMO | Admitting: General Surgery

## 2023-05-20 DIAGNOSIS — E11621 Type 2 diabetes mellitus with foot ulcer: Secondary | ICD-10-CM | POA: Diagnosis not present

## 2023-05-20 DIAGNOSIS — I129 Hypertensive chronic kidney disease with stage 1 through stage 4 chronic kidney disease, or unspecified chronic kidney disease: Secondary | ICD-10-CM | POA: Diagnosis not present

## 2023-05-20 DIAGNOSIS — M86171 Other acute osteomyelitis, right ankle and foot: Secondary | ICD-10-CM | POA: Diagnosis not present

## 2023-05-20 DIAGNOSIS — N1832 Chronic kidney disease, stage 3b: Secondary | ICD-10-CM | POA: Diagnosis not present

## 2023-05-20 DIAGNOSIS — L97514 Non-pressure chronic ulcer of other part of right foot with necrosis of bone: Secondary | ICD-10-CM | POA: Diagnosis not present

## 2023-05-20 DIAGNOSIS — E1122 Type 2 diabetes mellitus with diabetic chronic kidney disease: Secondary | ICD-10-CM | POA: Diagnosis not present

## 2023-05-20 LAB — GLUCOSE, CAPILLARY
Glucose-Capillary: 132 mg/dL — ABNORMAL HIGH (ref 70–99)
Glucose-Capillary: 233 mg/dL — ABNORMAL HIGH (ref 70–99)

## 2023-05-20 NOTE — Progress Notes (Signed)
RANDOL, ZUMSTEIN ([id number]) [id number] Page 1 of 2 Visit Report for 05/20/2023 Problem List Details Patient Name: Date of Service: [name] 05/20/2023 8:00 A M Medical Record Number: [id number] Patient Account Number: [id number] Date of Birth/Sex: Treating RN: March 30, 1951 (72 y.o. 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 No Yes necrosis of bone M86.171 Other acute osteomyelitis, right ankle and foot 03/02/2023 No Yes E11.621 Type 2 diabetes mellitus with foot ulcer 03/02/2023 No Yes I10 Essential (primary) hypertension 03/02/2023 No Yes N18.32 Chronic kidney disease, stage 3b 03/02/2023 No Yes Inactive Problems Resolved Problems ICD-10 Code Description Active Date Resolved Date L97.512 Non-pressure chronic ulcer of other part of right foot with fat layer 03/09/2023 03/09/2023 exposed Electronic Signature(s) Signed: 05/20/2023 1:16:28 PM By: [name] EMT Signed: 05/20/2023 2:30:17 PM By: [name] MD FACS Entered By: [name] on 05/20/2023 13:16:28 -------------------------------------------------------------------------------- SuperBill Details Patient Name: Date of Service: [name], [name] 05/20/2023 Medical Record Number: [id number] Patient Account Number: [id number] Date of Birth/Sex: Treating RN: [birth year] (72 y.o. [name] [place], Bernhard ([id number]) [id number] Page 2 of 2 Primary Care Provider: [name] Other Clinician: [name] Referring Provider: Treating Provider/Extender: [name] in Treatment: 11 Diagnosis Coding ICD-10 Codes Code  Description [phone] Non-pressure chronic ulcer of other part of right foot with necrosis of bone M86.171 Other acute osteomyelitis, right ankle and foot E11.621 Type 2 diabetes mellitus with foot ulcer I10 Essential (primary) hypertension N18.32 Chronic kidney disease, stage 3b Facility Procedures : CPT4 Code Description: [id number]-(Facility Use Only) HBOT full body chamber, , ICD-10 Diagnosis Description E11.621 Type 2 diabetes mellitus with foot ulcer L97.514 Non-pressure chronic ulcer of other part of right foot wi M86.171 Other acute  osteomyelitis, right ankle and foot Modifier: th necrosis o Quantity: 4 f bone Physician Procedures : CPT4 Code Description Modifier [id number] - WC PHYS HYPERBARIC OXYGEN THERAPY ICD-10 Diagnosis Description E11.621 Type 2 diabetes mellitus with foot ulcer L97.514 Non-pressure chronic ulcer of other part of right foot with necrosis o M86.171 Other  acute osteomyelitis, right ankle and foot Quantity: 1 f bone Electronic Signature(s) Signed: 05/20/2023 1:16:23 PM By: [name] EMT Signed: 05/20/2023 2:30:17 PM By: [name] MD FACS Entered By: [name] on 05/20/2023 13:16:22

## 2023-05-20 NOTE — Progress Notes (Signed)
ADREYAN, CARBAJAL ([id number]) [id number] Page 1 of 2 Visit Report for 05/20/2023 Arrival Information Details Patient Name: Date of Service: [name] 05/20/2023 8:00 A M Medical Record Number: [id number] Patient Account Number: [id number] Date of Birth/Sex: Treating RN: 01-16-1951 (72 y.o. [name], [name] Primary Care Yobana Culliton: [name] Other Clinician: Haywood Pao Referring Maleigh Bagot: Treating Tahliyah Anagnos/Extender: [name] in Treatment: 11 Visit Information History Since Last Visit All ordered tests and consults were completed: Yes Patient Arrived: Ambulatory Added or deleted any medications: No Arrival Time: 07:53 Any new allergies or adverse reactions: No Accompanied By: None Had a fall or experienced change in No Transfer Assistance: None activities of daily living that may affect Patient Identification Verified: Yes risk of falls: Secondary Verification Process Completed: Yes Signs or symptoms of abuse/neglect since last visito No Patient Requires Transmission-Based Precautions: No Hospitalized since last visit: No Patient Has Alerts: Yes Implantable device outside of the clinic excluding No Patient Alerts: Patient on Blood Thinner cellular tissue based products placed in the center since last visit: Pain Present Now: No Electronic Signature(s) Signed: 05/20/2023 1:12:14 PM By: [name] EMT Entered By: [name] on 05/20/2023 13:12:14 -------------------------------------------------------------------------------- Encounter Discharge Information Details Patient Name: Date of Service: [name], [name] 05/20/2023 8:00 A M Medical Record Number: [id number] Patient Account Number: [id number] Date of Birth/Sex: Treating RN: 11-Apr-1951 (72 y.o. [name], [name] Primary Care Hadlyn Amero: [name] Other Clinician: [name] Referring Hilda Wexler: Treating Gregorey Nabor/Extender: [name] in Treatment: 11 Encounter Discharge Information Items Discharge Condition: Stable Ambulatory Status: Ambulatory Discharge Destination: Home Transportation: Private Auto Accompanied By: None Schedule Follow-up Appointment: Yes Clinical Summary of Care: Electronic Signature(s) Signed: 05/20/2023 1:16:56 PM By: [name] EMT Entered By: [name] on 05/20/2023 13:16:56 Hafner, [name] ([id number]) [id number] Page 2 of 2 -------------------------------------------------------------------------------- Vitals Details Patient Name: Date of Service: [name] 05/20/2023 8:00 A M Medical Record Number: [id number] Patient Account Number: [id number] Date of Birth/Sex: Treating RN: 1950/11/23 (72 y.o. [name], [name] Primary Care Temiloluwa Recchia: [name] Other Clinician: [name] Referring Jarett Dralle: Treating Shimeka Bacot/Extender: [name]: 11 Vital Signs Time Taken: 07:57 Temperature (F): 97.5 Height (in): 67 Pulse (bpm): 65 Weight (lbs): 154 Respiratory Rate (breaths/min): 18 Body Mass Index (BMI): 24.1 Blood Pressure (mmHg): 151/78 Capillary Blood Glucose (mg/dl): 220 Reference Range: 80 - 120 mg / dl Electronic Signature(s) Signed: 05/20/2023 1:12:48 PM By: [name] EMT Entered By: [name] on 05/20/2023 13:12:48

## 2023-05-20 NOTE — Progress Notes (Addendum)
Coleman, [name] ([id number]) [id number] Page 1 of 2 Visit Report for 05/20/2023 HBO Details Patient Name: Date of Service: [name] 05/20/2023 8:00 A M Medical Record Number: [id number] Patient Account Number: [id number] Date of Birth/Sex: Treating RN: [birth year] (72 y.o. [name], [name] Primary Care Alois Mincer: [name] Other Clinician: [name] Referring Yanni Ruberg: Treating Georgine Wiltse/Extender: [name] in Treatment: 11 HBO Treatment Course Details Treatment Course Number: 1 Ordering Christoph Copelan: [name] T Treatments Ordered: otal 40 HBO Treatment Start Date: 04/06/2023 HBO Indication: Diabetic Ulcer(s) of the Lower Extremity Standard/Conservative Wound Care tried and failed greater than or equal to 30 days Wound #8 Right T Great oe HBO Treatment Details Treatment Number: 24 Patient Type: Outpatient Chamber Type: Monoplace Chamber Serial #: [id number] Treatment Protocol: 2.5 ATA with 90 minutes oxygen, with two 5 minute air breaks Treatment Details Compression Rate Down: 2.0 psi / minute De-Compression Rate Up: 2.0 psi / minute A breaks and breathing ir Compress Tx Pressure periods Decompress Decompress Begins Reached (leave unused spaces Begins Ends blank) Chamber Pressure (ATA 1 2.5 2.5 2.5 2.5 2.5 - - 2.5 1 ) Clock Time (24 hr) 08:18 08:31 09:01 09:06 09:36 09:41 - - 10:11 10:20 Treatment Length: 122 (minutes) Treatment Segments: 4 Vital Signs Capillary Blood Glucose Reference Range: 80 - 120 mg / dl HBO Diabetic Blood Glucose Intervention Range: <131 mg/dl or >884 mg/dl Time Vitals Blood Respiratory Capillary Blood Glucose Pulse Action Type: Pulse: Temperature: Taken: Pressure: Rate: Glucose (mg/dl): Meter #: Oximetry (%) Taken: Pre 07:57 151/78 65 18 97.5 132 Post 10:25 177/91 74 18 98.4 233 Treatment Response Treatment Toleration: Well Treatment Completion Status: Treatment  Completed without Adverse Event Treatment Notes The patient was given 8 oz of Glucerna to drink before the treatment was started per orders. Additional Procedure Documentation Tissue Sevierity: Necrosis of bone Physician HBO Attestation: I certify that I supervised this HBO treatment in accordance with Medicare guidelines. A trained emergency response team is readily available per Yes hospital policies and procedures. Continue HBOT as ordered. Yes Electronic Signature(s) Signed: 05/20/2023 4:07:35 PM By: [name] MD FACS Previous Signature: 05/20/2023 1:15:59 PM Version By: [name] EMT Entered By: [name] on 05/20/2023 16:07:35 Briner, Cowan ([id number]) [id number] Page 2 of 2 -------------------------------------------------------------------------------- HBO Safety Checklist Details Patient Name: Date of Service: [name] 05/20/2023 8:00 A M Medical Record Number: [id number] Patient Account Number: [id number] Date of Birth/Sex: Treating RN: 1951/02/26 (72 y.o. [name], [name] Primary Care Melissa Pulido: [name] Other Clinician: [name] Referring Zykeria Laguardia: Treating Juandiego Kolenovic/Extender: [name]: 11 HBO Safety Checklist Items Safety Checklist Consent Form Signed Patient voided / foley secured and emptied When did you last eato 0700 Last dose of injectable or oral agent Last Night Ostomy pouch emptied and vented if applicable NA All implantable devices assessed, documented and approved NA Intravenous access site secured and place NA Valuables secured Linens and cotton and cotton/polyester blend (less than 51% polyester) Personal oil-based products / skin lotions / body lotions removed Wigs or hairpieces removed NA Smoking or tobacco materials removed Books / newspapers / magazines / loose paper removed Cologne, aftershave, perfume and deodorant removed Jewelry removed  (may wrap wedding band) Make-up removed NA Hair care products removed Battery operated devices (external) removed Heating patches and chemical warmers removed Titanium eyewear removed NA Nail polish cured greater than 10 hours NA Casting material cured greater than 10 hours NA Hearing aids removed NA Loose dentures or partials  removed removed by patient Prosthetics have been removed NA Patient demonstrates correct use of air break device (if applicable) Patient concerns have been addressed Patient grounding bracelet on and cord attached to chamber Specifics for Inpatients (complete in addition to above) Medication sheet sent with patient NA Intravenous medications needed or due during therapy sent with patient NA Drainage tubes (e.g. nasogastric tube or chest tube secured and vented) NA Endotracheal or Tracheotomy tube secured NA Cuff deflated of air and inflated with saline NA Airway suctioned NA Notes The safety checklist was done before the treatment was started. Electronic Signature(s) Signed: 05/20/2023 1:14:04 PM By: [name] EMT Entered By: [name] on 05/20/2023 13:14:03

## 2023-05-21 ENCOUNTER — Encounter (HOSPITAL_BASED_OUTPATIENT_CLINIC_OR_DEPARTMENT_OTHER): Payer: Medicare HMO | Admitting: General Surgery

## 2023-05-21 DIAGNOSIS — M86171 Other acute osteomyelitis, right ankle and foot: Secondary | ICD-10-CM | POA: Diagnosis not present

## 2023-05-21 DIAGNOSIS — L97512 Non-pressure chronic ulcer of other part of right foot with fat layer exposed: Secondary | ICD-10-CM | POA: Diagnosis not present

## 2023-05-21 DIAGNOSIS — N1832 Chronic kidney disease, stage 3b: Secondary | ICD-10-CM | POA: Diagnosis not present

## 2023-05-21 DIAGNOSIS — E1122 Type 2 diabetes mellitus with diabetic chronic kidney disease: Secondary | ICD-10-CM | POA: Diagnosis not present

## 2023-05-21 DIAGNOSIS — L97514 Non-pressure chronic ulcer of other part of right foot with necrosis of bone: Secondary | ICD-10-CM | POA: Diagnosis not present

## 2023-05-21 DIAGNOSIS — I129 Hypertensive chronic kidney disease with stage 1 through stage 4 chronic kidney disease, or unspecified chronic kidney disease: Secondary | ICD-10-CM | POA: Diagnosis not present

## 2023-05-21 DIAGNOSIS — E11621 Type 2 diabetes mellitus with foot ulcer: Secondary | ICD-10-CM | POA: Diagnosis not present

## 2023-05-21 LAB — GLUCOSE, CAPILLARY
Glucose-Capillary: 184 mg/dL — ABNORMAL HIGH (ref 70–99)
Glucose-Capillary: 77 mg/dL (ref 70–99)

## 2023-05-21 NOTE — Progress Notes (Signed)
[place], [name] ([id number]) [id number] Page 1 of 2 Visit Report for 05/21/2023 HBO Details Patient Name: Date of Service: [name] 05/21/2023 7:30 A M Medical Record Number: [id number] Patient Account Number: [id number] Date of Birth/Sex: Treating RN: [birth year] (72 y.o. [name] Primary Care Dannel Rafter: [name] Other Clinician: [name] Referring Erick Oxendine: Treating Erie Radu/Extender: [name] in Treatment: 11 HBO Treatment Course Details Treatment Course Number: 1 Ordering Mykal Batiz: [name] T Treatments Ordered: otal 40 HBO Treatment Start Date: 04/06/2023 HBO Indication: Diabetic Ulcer(s) of the Lower Extremity Standard/Conservative Wound Care tried and failed greater than or equal to 30 days Wound #8 Right T Great oe HBO Treatment Details Treatment Number: 25 Patient Type: Outpatient Chamber Type: Monoplace Chamber Serial #: [id number] Treatment Protocol: 2.5 ATA with 90 minutes oxygen, with two 5 minute air breaks Treatment Details Compression Rate Down: 2.0 psi / minute De-Compression Rate Up: 2.0 psi / minute A breaks and breathing ir Compress Tx Pressure periods Decompress Decompress Begins Reached (leave unused spaces Begins Ends blank) Chamber Pressure (ATA 1 2.5 2.5 2.5 2.5 2.5 - - 2.5 1 ) Clock Time (24 hr) 08:12 08:28 08:58 09:03 09:33 09:38 - - 10:08 10:19 Treatment Length: 127 (minutes) Treatment Segments: 4 Vital Signs Capillary Blood Glucose Reference Range: 80 - 120 mg / dl HBO Diabetic Blood Glucose Intervention Range: <131 mg/dl or >425 mg/dl Type: Time Vitals Blood Pulse: Respiratory Temperature: Capillary Blood Glucose Pulse Action Taken: Pressure: Rate: Glucose (mg/dl): Meter #: Oximetry (%) Taken: Pre 07:57 137/83 66 18 97.2 184 Post 10:24 161/83 79 18 98 77 Patient given Glucerna. Waited for after. Treatment Response Treatment Toleration:  Well Treatment Completion Status: Treatment Completed without Adverse Event Treatment Notes The patient was given 8 oz of Glucerna to drink before the treatment was started per orders. The patient post blood sugar was 77. The patient was given 8 oz of Glucerna. The patient was discharged to the wound care center for doctor visit. Additional Procedure Documentation Tissue Sevierity: Necrosis of bone Physician HBO Attestation: I certify that I supervised this HBO treatment in accordance with Medicare guidelines. A trained emergency response team is readily available per Yes hospital policies and procedures. Continue HBOT as ordered. Yes Electronic Signature(s) Signed: 05/21/2023 3:01:34 PM By: [name] MD FACS Previous Signature: 05/21/2023 2:43:21 PM Version By: [name] EMT Entered By: [name] on 05/21/2023 15:01:34 Kuhar, [name] ([id number]) [id number] Page 2 of 2 -------------------------------------------------------------------------------- HBO Safety Checklist Details Patient Name: Date of Service: [name] 05/21/2023 7:30 A M Medical Record Number: [id number] Patient Account Number: [id number] Date of Birth/Sex: Treating RN: [birth year] (72 y.o. [name] Primary Care Mikaia Janvier: [name] Other Clinician: [name] Referring Elasia Furnish: Treating Hetty Linhart/Extender: [name] in Treatment: 11 HBO Safety Checklist Items Safety Checklist Consent Form Signed Patient voided / foley secured and emptied When did you last eato 0630 Last dose of injectable or oral agent 0600 Novolog Ostomy pouch emptied and vented if applicable NA All implantable devices assessed, documented and approved NA Intravenous access site secured and place NA Valuables secured Linens and cotton and cotton/polyester blend (less than 51% polyester) Personal oil-based products / skin lotions / body lotions  removed Wigs or hairpieces removed NA Smoking or tobacco materials removed Books / newspapers / magazines / loose paper removed Cologne, aftershave, perfume and deodorant removed Jewelry removed (may wrap wedding band) Make-up removed NA Hair care products removed Battery operated devices (external) removed  Heating patches and chemical warmers removed NA Titanium eyewear removed NA Nail polish cured greater than 10 hours NA Casting material cured greater than 10 hours NA Hearing aids removed NA Loose dentures or partials removed removed by patient Prosthetics have been removed NA Patient demonstrates correct use of air break device (if applicable) Patient concerns have been addressed Patient grounding bracelet on and cord attached to chamber Specifics for Inpatients (complete in addition to above) Medication sheet sent with patient NA Intravenous medications needed or due during therapy sent with patient NA Drainage tubes (e.g. nasogastric tube or chest tube secured and vented) NA Endotracheal or Tracheotomy tube secured NA Cuff deflated of air and inflated with saline NA Airway suctioned NA Notes The safety checklist was done before the treatment was started. Electronic Signature(s) Signed: 05/21/2023 2:32:29 PM By: [name] EMT Entered By: [name] on 05/21/2023 14:32:29

## 2023-05-21 NOTE — Progress Notes (Signed)
VICKY, MCCANLESS ([id number]) [id number] Page 1 of 7 Visit Report for 05/21/2023 Arrival Information Details Patient Name: Date of Service: [name] 05/21/2023 10:00 [name] M Medical Record Number: [id number] Patient Account Number: [id number] Date of Birth/Sex: Treating RN: 1951-01-03 (72 y.o. [name] Primary Care [name]: [name] Other Clinician: Referring Taylar Hartsough: Treating Tobie Hellen/Extender: [name] in Treatment: 11 Visit Information History Since Last Visit Added or deleted any medications: No Patient Arrived: Ambulatory Any new allergies or adverse reactions: No Arrival Time: 10:09 Had [name] fall or experienced change in No Accompanied By: self activities of daily living that may affect Transfer Assistance: None risk of falls: Patient Identification Verified: Yes Signs or symptoms of abuse/neglect since last visito No Secondary Verification Process Completed: Yes Hospitalized since last visit: No Patient Requires Transmission-Based Precautions: No Implantable device outside of the clinic excluding No Patient Has Alerts: Yes cellular tissue based products placed in the center Patient Alerts: Patient on Blood Thinner since last visit: Has Dressing in Place as Prescribed: Yes Pain Present Now: No Electronic Signature(s) Signed: 05/21/2023 3:30:19 PM By: Samuella Bruin Entered By: Samuella Bruin on 05/21/2023 10:09:29 -------------------------------------------------------------------------------- Encounter Discharge Information Details Patient Name: Date of Service: [name], [name] 05/21/2023 10:00 [name] M Medical Record Number: [id number] Patient Account Number: [id number] Date of Birth/Sex: Treating RN: 1951/06/30 (72 y.o. [name] Primary Care Yuette Putnam: [name] Other Clinician: Referring Jemuel Laursen: Treating [name]/Extender: [name] in  Treatment: 11 Encounter Discharge Information Items Post Procedure Vitals Discharge Condition: Stable Temperature (F): 98 Ambulatory Status: Ambulatory Pulse (bpm): 79 Discharge Destination: Home Respiratory Rate (breaths/min): 18 Transportation: Private Auto Blood Pressure (mmHg): 161/83 Accompanied By: self Schedule Follow-up Appointment: Yes Clinical Summary of Care: Patient Declined Electronic Signature(s) Signed: 05/21/2023 3:30:19 PM By: Samuella Bruin Entered By: Samuella Bruin on 05/21/2023 11:15:24 [place], [name] ([id number]) [id number] Page 2 of 7 -------------------------------------------------------------------------------- Lower Extremity Assessment Details Patient Name: Date of Service: [name] 05/21/2023 10:00 [name] M Medical Record Number: [id number] Patient Account Number: [id number] Date of Birth/Sex: Treating RN: May 17, 1951 (72 y.o. [name] Primary Care Louvenia Golomb: [name] Other Clinician: Referring Lucelia Lacey: Treating Reynol Arnone/Extender: [name]: 11 Edema Assessment Assessed: Kyra Searles: No] Franne Forts: No] Edema: [Left: N] [Right: o] Calf Left: Right: Point of Measurement: 33 cm From Medial Instep 34.5 cm Ankle Left: Right: Point of Measurement: 10 cm From Medial Instep 21.7 cm Electronic Signature(s) Signed: 05/21/2023 3:30:19 PM By: Samuella Bruin Entered By: Samuella Bruin on 05/21/2023 10:09:39 -------------------------------------------------------------------------------- Multi Wound Chart Details Patient Name: Date of Service: [name], [name] 05/21/2023 10:00 [name] M Medical Record Number: [id number] Patient Account Number: [id number] Date of Birth/Sex: Treating RN: Mar 25, 1951 (72 y.o. M) Primary Care Naiah Donahoe: [name] Other Clinician: Referring Imanuel Pruiett: Treating Yeilyn Gent/Extender: [name]:  11 Vital Signs Height(in): 67 Capillary Blood Glucose(mg/dl): 77 Weight(lbs): 623 Pulse(bpm): 79 Body Mass Index(BMI): 24.1 Blood Pressure(mmHg): 161/83 Temperature(F): 98 Respiratory Rate(breaths/min): 18 [8:Photos:] [N/[name]:N/[name]] Right T Great oe N/[name] N/[name] Wound Location: Gradually Appeared N/[name] N/[name] Wounding Event: Diabetic Wound/Ulcer of the Lower N/[name] N/[name] Primary Etiology: Extremity Hypertension, Type II Diabetes, N/[name] N/[name] Comorbid History: Osteoarthritis, Neuropathy, Garon Melander, [name] ([id number]) [id number] Page 3 of 7 Radiation 02/04/2023 N/[name] N/[name] Date Acquired: 94 N/[name] N/[name] Weeks of Treatment: Open N/[name] N/[name] Wound Status: No N/[name] N/[name] Wound Recurrence: 0.4x1.4x0.2 N/[name] N/[name] Measurements L x W x D (cm) 0.44 N/[name] N/[name] [name] (cm) : rea 0.088  N/[name] N/[name] Volume (cm) : 96.20% N/[name] N/[name] % Reduction in [name] rea: 96.20% N/[name] N/[name] % Reduction in Volume: Grade 3 N/[name] N/[name] Classification: Medium N/[name] N/[name] Exudate [name] mount: Serosanguineous N/[name] N/[name] Exudate Type: red, brown N/[name] N/[name] Exudate Color: Distinct, outline attached N/[name] N/[name] Wound Margin: Medium (34-66%) N/[name] N/[name] Granulation [name] mount: Red N/[name] N/[name] Granulation Quality: Medium (34-66%) N/[name] N/[name] Necrotic [name] mount: Fat Layer (Subcutaneous Tissue): Yes N/[name] N/[name] Exposed Structures: Fascia: No Tendon: No Muscle: No Joint: No Bone: No Medium (34-66%) N/[name] N/[name] Epithelialization: Debridement - Excisional N/[name] N/[name] Debridement: Pre-procedure Verification/Time Out 10:36 N/[name] N/[name] Taken: Lidocaine 4% Topical Solution N/[name] N/[name] Pain Control: Callus, Subcutaneous, Slough N/[name] N/[name] Tissue Debrided: Skin/Subcutaneous Tissue N/[name] N/[name] Level: 0.44 N/[name] N/[name] Debridement [name] (sq cm): rea Curette N/[name] N/[name] Instrument: Minimum N/[name] N/[name] Bleeding: Pressure N/[name] N/[name] Hemostasis [name] chieved: Procedure was tolerated well N/[name] N/[name] Debridement Treatment Response: 0.4x1.4x0.2 N/[name] N/[name] Post Debridement Measurements L x W x D (cm) 0.088 N/[name]  N/[name] Post Debridement Volume: (cm) Callus: Yes N/[name] N/[name] Periwound Skin Texture: Maceration: No N/[name] N/[name] Periwound Skin Moisture: Dry/Scaly: No No Abnormalities Noted N/[name] N/[name] Periwound Skin Color: No Abnormality N/[name] N/[name] Temperature: Debridement N/[name] N/[name] Procedures Performed: Treatment Notes Electronic Signature(s) Signed: 05/21/2023 10:44:20 AM By: [name] MD FACS Entered By: [name] on 05/21/2023 10:44:20 -------------------------------------------------------------------------------- Multi-Disciplinary Care Plan Details Patient Name: Date of Service: [name], [name] 05/21/2023 10:00 [name] M Medical Record Number: [id number] Patient Account Number: [id number] Date of Birth/Sex: Treating RN: August 01, 1951 (72 y.o. [name] Primary Care Chanie Soucek: [name] Other Clinician: Referring Dailyn Reith: Treating Harrol Novello/Extender: [name] in Treatment: 11 Multidisciplinary Care Plan reviewed with physician Active Inactive HBO Nursing Diagnoses: Anxiety related to knowledge deficit of hyperbaric oxygen therapy and treatment procedures [name], [name] ([id number]) [id number] Page 4 of 7 Goals: Patient will tolerate the hyperbaric oxygen therapy treatment Date Initiated: 03/02/2023 Target Resolution Date: 06/26/2023 Goal Status: Active Interventions: Assess for signs and symptoms related to adverse events, including but not limited to confinement anxiety, pneumothorax, oxygen toxicity and baurotrauma Notes: Wound/Skin Impairment Nursing Diagnoses: Impaired tissue integrity Knowledge deficit related to ulceration/compromised skin integrity Goals: Patient/caregiver will verbalize understanding of skin care regimen Date Initiated: 03/17/2023 Target Resolution Date: 06/26/2023 Goal Status: Active Ulcer/skin breakdown will have [name] volume reduction of 30% by week 4 Date Initiated: 03/17/2023 Date Inactivated:  04/02/2023 Target Resolution Date: 03/31/2023 Goal Status: Met Ulcer/skin breakdown will have [name] volume reduction of 50% by week 8 Date Initiated: 04/02/2023 Date Inactivated: 05/07/2023 Target Resolution Date: 04/30/2023 Goal Status: Met Interventions: Assess patient/caregiver ability to obtain necessary supplies Assess patient/caregiver ability to perform ulcer/skin care regimen upon admission and as needed Assess ulceration(s) every visit Treatment Activities: Skin care regimen initiated : 03/17/2023 Topical wound management initiated : 03/17/2023 Notes: Electronic Signature(s) Signed: 05/21/2023 3:30:19 PM By: Samuella Bruin Entered By: Samuella Bruin on 05/21/2023 10:14:17 -------------------------------------------------------------------------------- Pain Assessment Details Patient Name: Date of Service: [name], [name] 05/21/2023 10:00 [name] M Medical Record Number: [id number] Patient Account Number: [id number] Date of Birth/Sex: Treating RN: 1951-01-29 (72 y.o. [name] Primary Care Elvie Palomo: [name] Other Clinician: Referring Kelten Enochs: Treating Xxavier Noon/Extender: [name] in Treatment: 11 Active Problems Location of Pain Severity and Description of Pain Patient Has Paino No Site Locations Rate the pain. [name], [name] ([id number]) [id number] Page 5 of 7 Rate the pain. Current Pain Level: 0 Pain Management and Medication Current Pain Management: Electronic Signature(s) Signed: 05/21/2023 3:30:19  PM By: Gelene Mink By: Samuella Bruin on 05/21/2023 10:09:36 -------------------------------------------------------------------------------- Patient/Caregiver Education Details Patient Name: Date of Service: [name], [name] 7/25/2024andnbsp10:00 [name] M Medical Record Number: [id number] Patient Account Number: [id number] Date of Birth/Gender: Treating RN: 11/21/1950 (72 y.o. [name] Primary Care Physician: [name] Other Clinician: Referring Physician: Treating Physician/Extender: [name] in Treatment: 11 Education Assessment Education Provided To: Patient Education Topics Provided Wound/Skin Impairment: Methods: Explain/Verbal Responses: Reinforcements needed, State content correctly Electronic Signature(s) Signed: 05/21/2023 3:30:19 PM By: Samuella Bruin Entered By: Samuella Bruin on 05/21/2023 10:14:31 -------------------------------------------------------------------------------- Wound Assessment Details Patient Name: Date of Service: [name], [name] 05/21/2023 10:00 [name] M Medical Record Number: [id number] Patient Account Number: [id number] Date of Birth/Sex: Treating RN: 20-Sep-1951 (71 y.o. [name] Primary Care [name]: [name] Other Clinician: Referring Nivin Braniff: Treating Kaedence Connelly/Extender: [name] [place], [place] ([id number]) [phone].pdf Page 6 of 7 Weeks in Treatment: 11 Wound Status Wound Number: 8 Primary Diabetic Wound/Ulcer of the Lower Extremity Etiology: Wound Location: Right T Great oe Wound Open Wounding Event: Gradually Appeared Status: Date Acquired: 02/04/2023 Comorbid Hypertension, Type II Diabetes, Osteoarthritis, Neuropathy, Weeks Of Treatment: 11 History: Received Radiation Clustered Wound: No Photos Wound Measurements Length: (cm) 0.4 Width: (cm) 1.4 Depth: (cm) 0.2 Area: (cm) 0.44 Volume: (cm) 0.088 % Reduction in Area: 96.2% % Reduction in Volume: 96.2% Epithelialization: Medium (34-66%) Tunneling: No Undermining: No Wound Description Classification: Grade 3 Wound Margin: Distinct, outline attached Exudate Amount: Medium Exudate Type: Serosanguineous Exudate Color: red, brown Foul Odor After Cleansing: No Slough/Fibrino Yes Wound Bed Granulation Amount: Medium (34-66%) Exposed  Structure Granulation Quality: Red Fascia Exposed: No Necrotic Amount: Medium (34-66%) Fat Layer (Subcutaneous Tissue) Exposed: Yes Necrotic Quality: Adherent Slough Tendon Exposed: No Muscle Exposed: No Joint Exposed: No Bone Exposed: No Periwound Skin Texture Texture Color No Abnormalities Noted: No No Abnormalities Noted: Yes Callus: Yes Temperature / Pain Temperature: No Abnormality Moisture No Abnormalities Noted: No Dry / Scaly: No Maceration: No Treatment Notes Wound #8 (Toe Great) Wound Laterality: Right Cleanser Normal Saline Discharge Instruction: Cleanse the wound with Normal Saline prior to applying [name] clean dressing using gauze sponges, not tissue or cotton balls. Soap and Water Discharge Instruction: May shower and wash wound with dial antibacterial soap and water prior to dressing change. Peri-Wound Care Topical Primary Dressing Maxorb Extra Ag+ Alginate Dressing, 4x4.75 (in/in) Discharge Instruction: Apply to wound bed as instructed WARDEN, BUFFA ([id number]) [id number] Page 7 of 7 Secondary Dressing Woven Gauze Sponges 2x2 in Discharge Instruction: Apply over primary dressing as directed. Secured With Conforming Stretch Gauze Bandage, Sterile 2x75 (in/in) Discharge Instruction: Secure with stretch gauze as directed. Compression Wrap Compression Stockings Add-Ons Electronic Signature(s) Signed: 05/21/2023 3:30:19 PM By: Samuella Bruin Entered By: Samuella Bruin on 05/21/2023 10:31:01 -------------------------------------------------------------------------------- Vitals Details Patient Name: Date of Service: [name], [name] 05/21/2023 10:00 [name] M Medical Record Number: [id number] Patient Account Number: [id number] Date of Birth/Sex: Treating RN: Jul 14, 1951 (72 y.o. [name] Primary Care Waylon Hershey: [name] Other Clinician: Referring Jailin Manocchio: Treating [name]/Extender: [name]: 11 Vital Signs Time Taken: 10:24 Temperature (F): 98 Height (in): 67 Pulse (bpm): 79 Weight (lbs): 154 Respiratory Rate (breaths/min): 18 Body Mass Index (BMI): 24.1 Blood Pressure (mmHg): 161/83 Capillary Blood Glucose (mg/dl): 77 Reference Range: 80 - 120 mg / dl Electronic Signature(s) Signed: 05/21/2023 3:30:19 PM By: Samuella Bruin Entered By: Samuella Bruin on 05/21/2023 10:27:01

## 2023-05-21 NOTE — Progress Notes (Signed)
[name], [name] ([id number]) [id number] Page 1 of 2 Visit Report for 05/21/2023 Arrival Information Details Patient Name: Date of Service: [name] 05/21/2023 7:30 A M Medical Record Number: [id number] Patient Account Number: [id number] Date of Birth/Sex: Treating RN: 1950-12-02 (72 y.o. [name] Primary Care Keandre Linden: [name] Other Clinician: [name] Referring Gilberto Stanforth: Treating Adalene Gulotta/Extender: [name] in Treatment: 11 Visit Information History Since Last Visit All ordered tests and consults were completed: Yes Patient Arrived: Ambulatory Added or deleted any medications: No Arrival Time: 07:55 Any new allergies or adverse reactions: No Accompanied By: None Had a fall or experienced change in No Transfer Assistance: None activities of daily living that may affect Patient Identification Verified: Yes risk of falls: Secondary Verification Process Completed: Yes Signs or symptoms of abuse/neglect since last visito No Patient Requires Transmission-Based Precautions: No Hospitalized since last visit: No Patient Has Alerts: Yes Implantable device outside of the clinic excluding No Patient Alerts: Patient on Blood Thinner cellular tissue based products placed in the center since last visit: Pain Present Now: No Electronic Signature(s) Signed: 05/21/2023 2:30:34 PM By: [name] EMT Entered By: [name] on 05/21/2023 14:30:34 -------------------------------------------------------------------------------- Encounter Discharge Information Details Patient Name: Date of Service: [name] RD, [name] 05/21/2023 7:30 A M Medical Record Number: [id number] Patient Account Number: [id number] Date of Birth/Sex: Treating RN: 1951-08-14 (72 y.o. [name] Primary Care Tanisa Lagace: [name] Other Clinician: [name] Referring Zymere Patlan: Treating Kruti Horacek/Extender: [name] in Treatment: 11 Encounter Discharge Information Items Discharge Condition: Stable Ambulatory Status: Ambulatory Discharge Destination: Other (Note Required) Transportation: Ambulance Schedule Follow-up Appointment: Yes Clinical Summary of Care: Notes The patient was [facility] to [facility] for a doctor visit. Electronic Signature(s) Signed: 05/21/2023 2:45:51 PM By: [name] EMT Entered By: [name] on 05/21/2023 14:45:51 [name], [name] Marker ([id number]) [id number] Page 2 of 2 -------------------------------------------------------------------------------- Vitals Details Patient Name: Date of Service: [name] 05/21/2023 7:30 A M Medical Record Number: [id number] Patient Account Number: [id number] Date of Birth/Sex: Treating RN: 18-Aug-1951 (72 y.o. [name] Primary Care Leticia Mcdiarmid: [name] Other Clinician: [name] Referring Trevaun Rendleman: Treating Jamani Bearce/Extender: [name] in Treatment: 11 Vital Signs Time Taken: 07:57 Temperature (F): 97.2 Height (in): 67 Pulse (bpm): 66 Weight (lbs): 154 Respiratory Rate (breaths/min): 18 Body Mass Index (BMI): 24.1 Blood Pressure (mmHg): 137/83 Capillary Blood Glucose (mg/dl): 433 Reference Range: 80 - 120 mg / dl Electronic Signature(s) Signed: 05/21/2023 2:31:06 PM By: [name] EMT Entered By: [name] on 05/21/2023 14:31:06

## 2023-05-21 NOTE — Progress Notes (Signed)
JAQUAVIUS, HUDLER ([id number]) [id number] Page 1 of 2 Visit Report for 05/21/2023 Problem List Details Patient Name: Date of Service: [name] 05/21/2023 7:30 [name] M Medical Record Number: [id number] Patient Account Number: [id number] Date of Birth/Sex: Treating RN: September 11, 1951 (72 y.o. 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 No Yes necrosis of bone M86.171 Other acute osteomyelitis, right ankle and foot 03/02/2023 No Yes E11.621 Type 2 diabetes mellitus with foot ulcer 03/02/2023 No Yes I10 Essential (primary) hypertension 03/02/2023 No Yes N18.32 Chronic kidney disease, stage 3b 03/02/2023 No Yes Inactive Problems Resolved Problems ICD-10 Code Description Active Date Resolved Date L97.512 Non-pressure chronic ulcer of other part of right foot with fat layer 03/09/2023 03/09/2023 exposed Electronic Signature(s) Signed: 05/21/2023 2:43:53 PM By: [name] EMT Signed: 05/21/2023 3:01:08 PM By: [name] MD FACS Entered By: [name] on 05/21/2023 14:43:53 -------------------------------------------------------------------------------- SuperBill Details Patient Name: Date of Service: [name], [name] 05/21/2023 Medical Record Number: [id number] Patient Account Number: [id number] Date of Birth/Sex: Treating RN: July 04, 1951 (72 y.o. [name] [place], [place] ([id number]) [phone].pdf Page 2 of 2 Primary Care Provider: [name] Other Clinician: [name] Referring Provider: Treating Provider/Extender: [name] in Treatment: 11 Diagnosis Coding ICD-10 Codes Code  Description [phone] Non-pressure chronic ulcer of other part of right foot with necrosis of bone M86.171 Other acute osteomyelitis, right ankle and foot E11.621 Type 2 diabetes mellitus with foot ulcer I10 Essential (primary) hypertension N18.32 Chronic kidney disease, stage 3b Facility Procedures : CPT4 Code Description: [id number] G0277-(Facility Use Only) HBOT full body chamber, , ICD-10 Diagnosis Description E11.621 Type 2 diabetes mellitus with foot ulcer L97.514 Non-pressure chronic ulcer of other part of right foot wi M86.171 Other acute  osteomyelitis, right ankle and foot Modifier: th necrosis o Quantity: 4 f bone Physician Procedures : CPT4 Code Description Modifier [id number] - WC PHYS HYPERBARIC OXYGEN THERAPY ICD-10 Diagnosis Description E11.621 Type 2 diabetes mellitus with foot ulcer L97.514 Non-pressure chronic ulcer of other part of right foot with necrosis o M86.171 Other  acute osteomyelitis, right ankle and foot Quantity: 1 f bone Electronic Signature(s) Signed: 05/21/2023 2:43:46 PM By: [name] EMT Signed: 05/21/2023 3:01:08 PM By: [name] MD FACS Entered By: [name] on 05/21/2023 14:43:45

## 2023-05-22 ENCOUNTER — Encounter (HOSPITAL_BASED_OUTPATIENT_CLINIC_OR_DEPARTMENT_OTHER): Payer: Medicare HMO | Admitting: General Surgery

## 2023-05-25 ENCOUNTER — Encounter (HOSPITAL_BASED_OUTPATIENT_CLINIC_OR_DEPARTMENT_OTHER): Payer: Medicare HMO | Admitting: General Surgery

## 2023-05-25 DIAGNOSIS — E11621 Type 2 diabetes mellitus with foot ulcer: Secondary | ICD-10-CM | POA: Diagnosis not present

## 2023-05-25 DIAGNOSIS — I129 Hypertensive chronic kidney disease with stage 1 through stage 4 chronic kidney disease, or unspecified chronic kidney disease: Secondary | ICD-10-CM | POA: Diagnosis not present

## 2023-05-25 DIAGNOSIS — M86171 Other acute osteomyelitis, right ankle and foot: Secondary | ICD-10-CM | POA: Diagnosis not present

## 2023-05-25 DIAGNOSIS — L97514 Non-pressure chronic ulcer of other part of right foot with necrosis of bone: Secondary | ICD-10-CM | POA: Diagnosis not present

## 2023-05-25 DIAGNOSIS — E1122 Type 2 diabetes mellitus with diabetic chronic kidney disease: Secondary | ICD-10-CM | POA: Diagnosis not present

## 2023-05-25 DIAGNOSIS — N1832 Chronic kidney disease, stage 3b: Secondary | ICD-10-CM | POA: Diagnosis not present

## 2023-05-25 LAB — GLUCOSE, CAPILLARY
Glucose-Capillary: 221 mg/dL — ABNORMAL HIGH (ref 70–99)
Glucose-Capillary: 207 mg/dL — ABNORMAL HIGH (ref 70–99)

## 2023-05-25 NOTE — Progress Notes (Signed)
BREYDAN, COVA ([id number]) [id number] Page 1 of 1 Visit Report for 05/25/2023 SuperBill Details Patient Name: Date of Service: [name] 05/25/2023 Medical Record Number: [id number] Patient Account Number: [id number] Date of Birth/Sex: Treating RN: 04-Dec-1950 (72 y.o. [name] Primary Care Provider: [name] Other Clinician: Haywood Pao Referring Provider: Treating Provider/Extender: [name] in Treatment: 12 Diagnosis Coding ICD-10 Codes Code Description [phone] Non-pressure chronic ulcer of other part of right foot with necrosis of bone M86.171 Other acute osteomyelitis, right ankle and foot E11.621 Type 2 diabetes mellitus with foot ulcer I10 Essential (primary) hypertension N18.32 Chronic kidney disease, stage 3b Facility Procedures CPT4 Code Description Modifier Quantity [id number] G0277-(Facility Use Only) HBOT full body chamber, , 4 ICD-10 Diagnosis Description E11.621 Type 2 diabetes mellitus with foot ulcer L97.514 Non-pressure chronic ulcer of other part of right foot with necrosis of bone M86.171 Other acute osteomyelitis, right ankle and foot Physician Procedures Quantity CPT4 Code Description Modifier [id number] 99183 - WC PHYS HYPERBARIC OXYGEN THERAPY 1 ICD-10 Diagnosis Description E11.621 Type 2 diabetes mellitus with foot ulcer L97.514 Non-pressure chronic ulcer of other part of right foot with necrosis of bone M86.171 Other acute osteomyelitis, right ankle and foot Electronic Signature(s) Signed: 05/25/2023 12:04:28 PM By: Haywood Pao CHT EMT BS , , Signed: 05/25/2023 12:47:39 PM By: [name] MD FACS Entered By: Haywood Pao on 05/25/2023 12:04:28

## 2023-05-25 NOTE — Progress Notes (Addendum)
EYTHAN, BEBEAU ([id number]) [id number] Page 1 of 2 Visit Report for 05/25/2023 Arrival Information Details Patient Name: Date of Service: [name] 05/25/2023 7:30 A M Medical Record Number: [id number] Patient Account Number: [id number] Date of Birth/Sex: Treating RN: Dec 27, 1950 (73 y.o. [name] Primary Care Naviah Belfield: [name] Other Clinician: Haywood Pao Referring Robet Crutchfield: Treating Josua Ferrebee/Extender: [name] in Treatment: 12 Visit Information History Since Last Visit All ordered tests and consults were completed: Yes Patient Arrived: Ambulatory Added or deleted any medications: No Arrival Time: 07:47 Any new allergies or adverse reactions: No Accompanied By: self Had a fall or experienced change in No Transfer Assistance: None activities of daily living that may affect Patient Identification Verified: Yes risk of falls: Secondary Verification Process Completed: Yes Signs or symptoms of abuse/neglect since last visito No Patient Requires Transmission-Based Precautions: No Hospitalized since last visit: No Patient Has Alerts: Yes Implantable device outside of the clinic excluding No Patient Alerts: Patient on Blood Thinner cellular tissue based products placed in the center since last visit: Pain Present Now: No Electronic Signature(s) Signed: 05/25/2023 9:17:29 AM By: Haywood Pao CHT EMT BS , , Entered By: Haywood Pao on 05/25/2023 09:17:29 -------------------------------------------------------------------------------- Encounter Discharge Information Details Patient Name: Date of Service: [name], [name] 05/25/2023 7:30 A M Medical Record Number: [id number] Patient Account Number: [id number] Date of Birth/Sex: Treating RN: 31-Dec-1950 (73 y.o. [name] Primary Care Elchonon Maxson: [name] Other Clinician: Haywood Pao Referring Yusuke Beza: Treating  Rayven Rettig/Extender: [name] in Treatment: 12 Encounter Discharge Information Items Discharge Condition: Stable Ambulatory Status: Ambulatory Discharge Destination: Home Transportation: Private Auto Accompanied By: self Schedule Follow-up Appointment: No Clinical Summary of Care: Electronic Signature(s) Signed: 05/25/2023 12:05:10 PM By: Haywood Pao CHT EMT BS , , Entered By: Haywood Pao on 05/25/2023 12:05:10 [name] ([id number]) [id number] Page 2 of 2 -------------------------------------------------------------------------------- Vitals Details Patient Name: Date of Service: [name] 05/25/2023 7:30 A M Medical Record Number: [id number] Patient Account Number: [id number] Date of Birth/Sex: Treating RN: July 01, 1951 (72 y.o. [name] Primary Care Srikar Chiang: [name] Other Clinician: Haywood Pao Referring Teria Khachatryan: Treating Monte Bronder/Extender: [name] in Treatment: 12 Vital Signs Time Taken: 08:00 Temperature (F): 98.1 Height (in): 67 Pulse (bpm): 70 Weight (lbs): 154 Respiratory Rate (breaths/min): 20 Body Mass Index (BMI): 24.1 Blood Pressure (mmHg): 127/76 Capillary Blood Glucose (mg/dl): 151 Reference Range: 80 - 120 mg / dl Electronic Signature(s) Signed: 05/25/2023 9:18:07 AM By: Haywood Pao CHT EMT BS , , Entered By: Haywood Pao on 05/25/2023 09:18:07

## 2023-05-25 NOTE — Progress Notes (Addendum)
[place], [name] ([id number]) [id number] Page 1 of 2 Visit Report for 05/25/2023 HBO Details Patient Name: Date of Service: [name] 05/25/2023 7:30 A M Medical Record Number: [id number] Patient Account Number: [id number] Date of Birth/Sex: Treating RN: [birth year] (72 y.o. [name] Primary Care Jayquan Bradsher: [name] Other Clinician: Haywood Pao Referring Raimi Guillermo: Treating Parneet Glantz/Extender: [name] in Treatment: 12 HBO Treatment Course Details Treatment Course Number: 1 Ordering Merlina Marchena: [name] T Treatments Ordered: otal 40 HBO Treatment Start Date: 04/06/2023 HBO Indication: Diabetic Ulcer(s) of the Lower Extremity Standard/Conservative Wound Care tried and failed greater than or equal to 30 days Wound #8 Right T Great oe HBO Treatment Details Treatment Number: 26 Patient Type: Outpatient Chamber Type: Monoplace Chamber Serial #: [id number] Treatment Protocol: 2.5 ATA with 90 minutes oxygen, with two 5 minute air breaks Treatment Details Compression Rate Down: 1.5 psi / minute De-Compression Rate Up: 2.0 psi / minute A breaks and breathing ir Compress Tx Pressure periods Decompress Decompress Begins Reached (leave unused spaces Begins Ends blank) Chamber Pressure (ATA 1 2.5 2.5 2.5 2.5 2.5 - - 2.5 1 ) Clock Time (24 hr) 08:19 08:32 09:02 09:07 09:37 09:42 - - 10:12 10:23 Treatment Length: 124 (minutes) Treatment Segments: 4 Vital Signs Capillary Blood Glucose Reference Range: 80 - 120 mg / dl HBO Diabetic Blood Glucose Intervention Range: <131 mg/dl or >324 mg/dl Type: Time Vitals Blood Respiratory Capillary Blood Glucose Pulse Action Pulse: Temperature: Taken: Pressure: Rate: Glucose (mg/dl): Meter #: Oximetry (%) Taken: Pre 07:56 127/76 70 20 98.1 221 none per protocol Post 10:28 179/80 72 18 97.7 207 none per protocol Treatment Response Treatment Toleration:  Well Treatment Completion Status: Treatment Completed without Adverse Event Treatment Notes [name] arrived with vital signs within normal range. Patient drank an 8 oz Glucerna per physician orders. He prepared for treatment. 8 oz orange juice was sent in with the patient for safety. After performing a safety check, he was placed in the chamber which was compressed at a rate of 2 psi/min after confirming normal ear equalization. He tolerated the treatment and subsequent decompression of the chamber at a rate of 2 psi/min. He denied issues with ear equalization and/or trauma associated with barotrauma. Post-treatment vital signs were normal without intervention. [name] was stable upon discharge. Additional Procedure Documentation Tissue Sevierity: Necrosis of bone Physician HBO Attestation: I certify that I supervised this HBO treatment in accordance with Medicare guidelines. A trained emergency response team is readily available per Yes hospital policies and procedures. Continue HBOT as ordered. Yes Electronic Signature(s) Signed: 05/25/2023 12:43:26 PM By: [name] Guess MD FACS Previous Signature: 05/25/2023 12:04:09 PM Version By: Haywood Pao CHT EMT BS , , [name], [name] ([id number]) [id number] Page 2 of 2 Previous Signature: 05/25/2023 12:04:09 PM Version By: Haywood Pao CHT EMT BS , , Previous Signature: 05/25/2023 9:35:24 AM Version By: Haywood Pao CHT EMT BS , , Entered By: [name] on 05/25/2023 12:43:26 -------------------------------------------------------------------------------- HBO Safety Checklist Details Patient Name: Date of Service: [name], [name] 05/25/2023 7:30 A M Medical Record Number: [id number] Patient Account Number: [id number] Date of Birth/Sex: Treating RN: [birth year] (72 y.o. [name] Primary Care Chanse Kagel: [name] Other Clinician: Haywood Pao Referring Azharia Surratt: Treating  Rilen Shukla/Extender: [name] in Treatment: 12 HBO Safety Checklist Items Safety Checklist Consent Form Signed Patient voided / foley secured and emptied When did you last eato 0700-Chicken [employer]iscuit Last dose of injectable or oral agent Last Night 22units  Ostomy pouch emptied and vented if applicable NA All implantable devices assessed, documented and approved NA Intravenous access site secured and place NA Valuables secured Linens and cotton and cotton/polyester blend (less than 51% polyester) Personal oil-based products / skin lotions / body lotions removed Wigs or hairpieces removed NA Smoking or tobacco materials removed NA Books / newspapers / magazines / loose paper removed Cologne, aftershave, perfume and deodorant removed Jewelry removed (may wrap wedding band) Make-up removed Hair care products removed Battery operated devices (external) removed Heating patches and chemical warmers removed Titanium eyewear removed Nail polish cured greater than 10 hours NA Casting material cured greater than 10 hours NA Hearing aids removed NA Loose dentures or partials removed dentures removed Prosthetics have been removed NA Patient demonstrates correct use of air break device (if applicable) Patient concerns have been addressed Patient grounding bracelet on and cord attached to chamber Specifics for Inpatients (complete in addition to above) Medication sheet sent with patient NA Intravenous medications needed or due during therapy sent with patient NA Drainage tubes (e.g. nasogastric tube or chest tube secured and vented) NA Endotracheal or Tracheotomy tube secured NA Cuff deflated of air and inflated with saline NA Airway suctioned NA Notes Paper version used prior to treatment start. Electronic Signature(s) Signed: 05/25/2023 9:22:11 AM By: Haywood Pao CHT EMT BS , , Entered By: Haywood Pao on 05/25/2023 09:22:10

## 2023-05-26 ENCOUNTER — Encounter (HOSPITAL_BASED_OUTPATIENT_CLINIC_OR_DEPARTMENT_OTHER): Payer: Medicare HMO | Admitting: General Surgery

## 2023-05-26 DIAGNOSIS — E11621 Type 2 diabetes mellitus with foot ulcer: Secondary | ICD-10-CM | POA: Diagnosis not present

## 2023-05-26 DIAGNOSIS — N1832 Chronic kidney disease, stage 3b: Secondary | ICD-10-CM | POA: Diagnosis not present

## 2023-05-26 DIAGNOSIS — M86171 Other acute osteomyelitis, right ankle and foot: Secondary | ICD-10-CM | POA: Diagnosis not present

## 2023-05-26 DIAGNOSIS — I129 Hypertensive chronic kidney disease with stage 1 through stage 4 chronic kidney disease, or unspecified chronic kidney disease: Secondary | ICD-10-CM | POA: Diagnosis not present

## 2023-05-26 DIAGNOSIS — L97514 Non-pressure chronic ulcer of other part of right foot with necrosis of bone: Secondary | ICD-10-CM | POA: Diagnosis not present

## 2023-05-26 DIAGNOSIS — E1122 Type 2 diabetes mellitus with diabetic chronic kidney disease: Secondary | ICD-10-CM | POA: Diagnosis not present

## 2023-05-26 LAB — GLUCOSE, CAPILLARY
Glucose-Capillary: 241 mg/dL — ABNORMAL HIGH (ref 70–99)
Glucose-Capillary: 81 mg/dL (ref 70–99)

## 2023-05-26 NOTE — Progress Notes (Addendum)
SALEY, [name] ([id number]) [id number] Page 1 of 2 Visit Report for 05/26/2023 Arrival Information Details Patient Name: Date of Service: [name] 05/26/2023 7:30 A M Medical Record Number: [id number] Patient Account Number: [id number] Date of Birth/Sex: Treating RN: November 18, 1950 (72 y.o. [name] Trason Shifflet: [name] Other Clinician: [name] Referring Asher Babilonia: Treating Lovelle Lema/Extender: [name] in Treatment: 12 Visit Information History Since Last Visit All ordered tests and consults were completed: Yes Patient Arrived: Ambulatory Added or deleted any medications: No Arrival Time: 07:40 Any new allergies or adverse reactions: No Accompanied By: None Had a fall or experienced change in No Transfer Assistance: None activities of daily living that may affect Patient Identification Verified: Yes risk of falls: Secondary Verification Process Completed: Yes Signs or symptoms of abuse/neglect since last visito No Patient Requires Transmission-Based Precautions: No Hospitalized since last visit: No Patient Has Alerts: Yes Implantable device outside of the clinic excluding No Patient Alerts: Patient on Blood Thinner cellular tissue based products placed in the center since last visit: Pain Present Now: No Electronic Signature(s) Signed: 05/26/2023 9:05:26 AM By: [name] EMT Entered By: [name] on 05/26/2023 09:05:26 -------------------------------------------------------------------------------- Encounter Discharge Information Details Patient Name: Date of Service: [name], [name] 05/26/2023 7:30 A M Medical Record Number: [id number] Patient Account Number: [id number] Date of Birth/Sex: Treating RN: 1951/05/07 (72 y.o. [name] Erin Obando: [name] Other Clinician: [name] Referring Caedon Bond: Treating Tomeca Helm/Extender: [name] in Treatment: 12 Encounter Discharge Information Items Discharge Condition: Stable Ambulatory Status: Ambulatory Discharge Destination: Home Transportation: Private Auto Accompanied By: None Schedule Follow-up Appointment: Yes Clinical Summary of Care: Electronic Signature(s) Signed: 05/26/2023 1:09:39 PM By: [name] EMT Entered By: [name] on 05/26/2023 13:09:39 [place], [name] ([id number]) [id number] Page 2 of 2 -------------------------------------------------------------------------------- Vitals Details Patient Name: Date of Service: [name] 05/26/2023 7:30 A M Medical Record Number: [id number] Patient Account Number: [id number] Date of Birth/Sex: Treating RN: 1951/08/03 (72 y.o. [name] Mathhew Buysse: [name] Other Clinician: [name] Referring Pluma Diniz: Treating Georgeanne Frankland/Extender: [name] in Treatment: 12 Vital Signs Time Taken: 07:44 Temperature (F): 98.1 Height (in): 67 Pulse (bpm): 67 Weight (lbs): 154 Respiratory Rate (breaths/min): 18 Body Mass Index (BMI): 24.1 Blood Pressure (mmHg): 130/81 Capillary Blood Glucose (mg/dl): 176 Reference Range: 80 - 120 mg / dl Electronic Signature(s) Signed: 05/26/2023 9:05:50 AM By: [name] EMT Entered By: [name] on 05/26/2023 09:05:50

## 2023-05-26 NOTE — Progress Notes (Addendum)
[place], [name] ([id number]) [id number] Page 1 of 2 Visit Report for 05/26/2023 HBO Details Patient Name: Date of Service: [name] 05/26/2023 7:30 A M Medical Record Number: [id number] Patient Account Number: [id number] Date of Birth/Sex: Treating RN: [birth year] (72 y.o. [name] Primary Care [name]: [name] Other Clinician: Karl [name] Referring [name]: Treating [name]/Extender: [name] in Treatment: 12 HBO Treatment Course Details Treatment Course Number: 1 Ordering [name]: [name] T Treatments Ordered: otal 40 HBO Treatment Start Date: 04/06/2023 HBO Indication: Diabetic Ulcer(s) of the Lower Extremity Standard/Conservative Wound Care tried and failed greater than or equal to 30 days Wound #8 Right T Great oe HBO Treatment Details Treatment Number: 27 Patient Type: Outpatient Chamber Type: Monoplace Chamber Serial #: [id number] Treatment Protocol: 2.5 ATA with 90 minutes oxygen, with two 5 minute air breaks Treatment Details Compression Rate Down: 2.0 psi / minute De-Compression Rate Up: 2.0 psi / minute A breaks and breathing ir Compress Tx Pressure periods Decompress Decompress Begins Reached (leave unused spaces Begins Ends blank) Chamber Pressure (ATA 1 2.5 2.5 2.5 2.5 2.5 - - 2.5 1 ) Clock Time (24 hr) 08:22 08:31 09:01 09:06 09:11 09:41 - - 10:11 10:20 Treatment Length: 118 (minutes) Treatment Segments: 4 Vital Signs Capillary Blood Glucose Reference Range: 80 - 120 mg / dl HBO Diabetic Blood Glucose Intervention Range: <131 mg/dl or >732 mg/dl Type: Time Vitals Blood Pulse: Respiratory Temperature: Capillary Blood Glucose Pulse Action Taken: Pressure: Rate: Glucose (mg/dl): Meter #: Oximetry (%) Taken: Pre 07:44 130/81 67 18 98.1 241 Post 10:26 160/77 73 18 97.7 81 Patient given 8ozGlucerna. Waited for after. Treatment Response Treatment Toleration:  Well Treatment Completion Status: Treatment Completed without Adverse Event Treatment Notes The patient was given 8 oz of Glucerna per treatment per orders. Post treatment blood sugar was 81. The patient was given 8 oz of Glucerna and waited 15 min before leaving. Additional Procedure Documentation Tissue Sevierity: Necrosis of bone Physician HBO Attestation: I certify that I supervised this HBO treatment in accordance with Medicare guidelines. A trained emergency response team is readily available per Yes hospital policies and procedures. Continue HBOT as ordered. Yes Electronic Signature(s) Signed: 05/26/2023 3:22:30 PM By: [name] MD FACS Previous Signature: 05/26/2023 1:08:22 PM Version By: [name] EMT Entered By: [name] on 05/26/2023 15:22:30 [place], [name] ([id number]) [id number] Page 2 of 2 -------------------------------------------------------------------------------- HBO Safety Checklist Details Patient Name: Date of Service: [name] 05/26/2023 7:30 A M Medical Record Number: [id number] Patient Account Number: [id number] Date of Birth/Sex: Treating RN: 12/16/1950 (72 y.o. [name] Primary Care [name]: [name] Other Clinician: Karl [name] Referring [name]: Treating [name]/Extender: [name] in Treatment: 12 HBO Safety Checklist Items Safety Checklist Consent Form Signed Patient voided / foley secured and emptied When did you last eato 0700 Last dose of injectable or oral agent 0700 22 units Novolog Ostomy pouch emptied and vented if applicable NA All implantable devices assessed, documented and approved NA Intravenous access site secured and place NA Valuables secured Linens and cotton and cotton/polyester blend (less than 51% polyester) Personal oil-based products / skin lotions / body lotions removed Wigs or hairpieces removed NA Smoking or tobacco  materials removed Books / newspapers / magazines / loose paper removed Cologne, aftershave, perfume and deodorant removed Jewelry removed (may wrap wedding band) Make-up removed NA Hair care products removed Battery operated devices (external) removed Heating patches and chemical warmers removed Titanium eyewear removed NA  Nail polish cured greater than 10 hours NA Casting material cured greater than 10 hours NA Hearing aids removed NA Loose dentures or partials removed removed by patient Prosthetics have been removed NA Patient demonstrates correct use of air break device (if applicable) Patient concerns have been addressed Patient grounding bracelet on and cord attached to chamber Specifics for Inpatients (complete in addition to above) Medication sheet sent with patient NA Intravenous medications needed or due during therapy sent with patient NA Drainage tubes (e.g. nasogastric tube or chest tube secured and vented) NA Endotracheal or Tracheotomy tube secured NA Cuff deflated of air and inflated with saline NA Airway suctioned NA Notes The safety checklist was done before the treatment was started. Electronic Signature(s) Signed: 05/26/2023 1:01:53 PM By: [name] EMT Entered By: [name] on 05/26/2023 13:01:53

## 2023-05-26 NOTE — Progress Notes (Signed)
ZEID, RUOFF ([id number]) [id number] Page 1 of 2 Visit Report for 05/26/2023 Problem List Details Patient Name: Date of Service: [name] 05/26/2023 7:30 A M Medical Record Number: [id number] Patient Account Number: [id number] Date of Birth/Sex: Treating RN: [birth year] (72 y.o. 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/02/2023 No Yes necrosis of bone M86.171 Other acute osteomyelitis, right ankle and foot 03/02/2023 No Yes E11.621 Type 2 diabetes mellitus with foot ulcer 03/02/2023 No Yes I10 Essential (primary) hypertension 03/02/2023 No Yes N18.32 Chronic kidney disease, stage 3b 03/02/2023 No Yes Inactive Problems Resolved Problems ICD-10 Code Description Active Date Resolved Date L97.512 Non-pressure chronic ulcer of other part of right foot with fat layer 03/09/2023 03/09/2023 exposed Electronic Signature(s) Signed: 05/26/2023 1:08:51 PM By: [name] EMT Signed: 05/26/2023 3:21:59 PM By: [name] MD FACS Entered By: [name] on 05/26/2023 13:08:51 -------------------------------------------------------------------------------- SuperBill Details Patient Name: Date of Service: [name] 05/26/2023 Medical Record Number: [id number] Patient Account Number: [id number] Date of Birth/Sex: Treating RN: 1951-07-29 (73 y.o. Claudia Desanctis, [address] [place], [name] ([id number]) [phone].pdf Page 2 of 2 Primary Care Provider: [name] Other Clinician: [name] Referring Provider: Treating Provider/Extender: [name] in Treatment: 12 Diagnosis Coding ICD-10 Codes Code  Description [phone] Non-pressure chronic ulcer of other part of right foot with necrosis of bone M86.171 Other acute osteomyelitis, right ankle and foot E11.621 Type 2 diabetes mellitus with foot ulcer I10 Essential (primary) hypertension N18.32 Chronic kidney disease, stage 3b Facility Procedures : CPT4 Code Description: [id number] G0277-(Facility Use Only) HBOT full body chamber, , ICD-10 Diagnosis Description E11.621 Type 2 diabetes mellitus with foot ulcer L97.514 Non-pressure chronic ulcer of other part of right foot wi M86.171 Other acute  osteomyelitis, right ankle and foot Modifier: th necrosis o Quantity: 4 f bone Physician Procedures : CPT4 Code Description Modifier [id number] - WC PHYS HYPERBARIC OXYGEN THERAPY ICD-10 Diagnosis Description E11.621 Type 2 diabetes mellitus with foot ulcer L97.514 Non-pressure chronic ulcer of other part of right foot with necrosis o M86.171 Other  acute osteomyelitis, right ankle and foot Quantity: 1 f bone Electronic Signature(s) Signed: 05/26/2023 1:08:46 PM By: [name] EMT Signed: 05/26/2023 3:21:59 PM By: [name] MD FACS Entered By: [name] on 05/26/2023 13:08:46

## 2023-05-27 ENCOUNTER — Encounter (HOSPITAL_BASED_OUTPATIENT_CLINIC_OR_DEPARTMENT_OTHER): Payer: Medicare HMO | Admitting: General Surgery

## 2023-05-27 DIAGNOSIS — E1122 Type 2 diabetes mellitus with diabetic chronic kidney disease: Secondary | ICD-10-CM | POA: Diagnosis not present

## 2023-05-27 DIAGNOSIS — N1832 Chronic kidney disease, stage 3b: Secondary | ICD-10-CM | POA: Diagnosis not present

## 2023-05-27 DIAGNOSIS — E11621 Type 2 diabetes mellitus with foot ulcer: Secondary | ICD-10-CM | POA: Diagnosis not present

## 2023-05-27 DIAGNOSIS — L97514 Non-pressure chronic ulcer of other part of right foot with necrosis of bone: Secondary | ICD-10-CM | POA: Diagnosis not present

## 2023-05-27 DIAGNOSIS — I129 Hypertensive chronic kidney disease with stage 1 through stage 4 chronic kidney disease, or unspecified chronic kidney disease: Secondary | ICD-10-CM | POA: Diagnosis not present

## 2023-05-27 DIAGNOSIS — M86171 Other acute osteomyelitis, right ankle and foot: Secondary | ICD-10-CM | POA: Diagnosis not present

## 2023-05-27 LAB — GLUCOSE, CAPILLARY
Glucose-Capillary: 182 mg/dL — ABNORMAL HIGH (ref 70–99)
Glucose-Capillary: 81 mg/dL (ref 70–99)

## 2023-05-27 NOTE — Progress Notes (Signed)
HEKTOR, ANTHES ([id number]) [id number] Page 1 of 2 Visit Report for 05/27/2023 Problem List Details Patient Name: Date of Service: [name] 05/27/2023 7:30 A M Medical Record Number: [id number] Patient Account Number: [id number] Date of Birth/Sex: Treating RN: Jul 28, 1951 (72 y.o. 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 03/02/2023 No Yes necrosis of bone M86.171 Other acute osteomyelitis, right ankle and foot 03/02/2023 No Yes E11.621 Type 2 diabetes mellitus with foot ulcer 03/02/2023 No Yes I10 Essential (primary) hypertension 03/02/2023 No Yes N18.32 Chronic kidney disease, stage 3b 03/02/2023 No Yes Inactive Problems Resolved Problems ICD-10 Code Description Active Date Resolved Date L97.512 Non-pressure chronic ulcer of other part of right foot with fat layer 03/09/2023 03/09/2023 exposed Electronic Signature(s) Signed: 05/27/2023 11:54:32 AM By: [name] EMT Signed: 05/27/2023 12:15:41 PM By: [name] MD FACS Entered By: [name] on 05/27/2023 11:54:32 -------------------------------------------------------------------------------- SuperBill Details Patient Name: Date of Service: [name], [name] 05/27/2023 Medical Record Number: [id number] Patient Account Number: [id number] Date of Birth/Sex: Treating RN: 18-Oct-1951 (72 y.o. [name] [name], [name] ([id number]) [phone].pdf Page 2 of 2 Primary Care Provider: [name] Other Clinician: [name] Referring Provider: Treating Provider/Extender: [name] in Treatment: 12 Diagnosis Coding ICD-10 Codes Code  Description [phone] Non-pressure chronic ulcer of other part of right foot with necrosis of bone M86.171 Other acute osteomyelitis, right ankle and foot E11.621 Type 2 diabetes mellitus with foot ulcer I10 Essential (primary) hypertension N18.32 Chronic kidney disease, stage 3b Facility Procedures : CPT4 Code Description: [id number] G0277-(Facility Use Only) HBOT full body chamber, , ICD-10 Diagnosis Description E11.621 Type 2 diabetes mellitus with foot ulcer L97.514 Non-pressure chronic ulcer of other part of right foot wi M86.171 Other acute  osteomyelitis, right ankle and foot Modifier: th necrosis o Quantity: 4 f bone Physician Procedures : CPT4 Code Description Modifier [id number] 99183 - WC PHYS HYPERBARIC OXYGEN THERAPY ICD-10 Diagnosis Description E11.621 Type 2 diabetes mellitus with foot ulcer L97.514 Non-pressure chronic ulcer of other part of right foot with necrosis o M86.171 Other  acute osteomyelitis, right ankle and foot Quantity: 1 f bone Electronic Signature(s) Signed: 05/27/2023 11:54:25 AM By: [name] EMT Signed: 05/27/2023 12:15:41 PM By: [name] MD FACS Entered By: [name] on 05/27/2023 11:54:24

## 2023-05-27 NOTE — Progress Notes (Signed)
MARKIESE, MAILE ([id number]) [id number] Page 1 of 2 Visit Report for 05/27/2023 Arrival Information Details Patient Name: Date of Service: [name] 05/27/2023 7:30 [name] M Medical Record Number: [id number] Patient Account Number: [id number] Date of Birth/Sex: Treating RN: [birth year] (72 y.o. [name] Primary Care [name]: [name] Other Clinician: Karl [name] Referring [name]: Treating [name]/Extender: [name] in Treatment: 12 Visit Information History Since Last Visit All ordered tests and consults were completed: Yes Patient Arrived: Ambulatory Added or deleted any medications: No Arrival Time: 07:30 Any new allergies or adverse reactions: No Accompanied By: None Had [name] fall or experienced change in No Transfer Assistance: None activities of daily living that may affect Patient Identification Verified: Yes risk of falls: Secondary Verification Process Completed: Yes Signs or symptoms of abuse/neglect since last visito No Patient Requires Transmission-Based Precautions: No Hospitalized since last visit: No Patient Has Alerts: Yes Implantable device outside of the clinic excluding No Patient Alerts: Patient on Blood Thinner cellular tissue based products placed in the center since last visit: Pain Present Now: No Electronic Signature(s) Signed: 05/27/2023 11:49:32 AM By: [name] EMT Entered By: [name] on 05/27/2023 11:49:32 -------------------------------------------------------------------------------- Encounter Discharge Information Details Patient Name: Date of Service: [name] [name], [name] 05/27/2023 7:30 [name] M Medical Record Number: [id number] Patient Account Number: [id number] Date of Birth/Sex: Treating RN: [name] (72 y.o. [name], [name] Primary Care Danell Vazquez: [name] Other Clinician: Karl [name] Referring Aubriegh Minch: Treating [name]/Extender: [name] in Treatment: 12 Encounter Discharge Information Items Discharge Condition: Stable Ambulatory Status: Ambulatory Discharge Destination: Home Transportation: Private Auto Accompanied By: None Schedule Follow-up Appointment: Yes Clinical Summary of Care: Electronic Signature(s) Signed: 05/27/2023 11:56:18 AM By: [name] EMT Entered By: [name] on 05/27/2023 11:56:17 [name] ([id number]) [id number] Page 2 of 2 -------------------------------------------------------------------------------- Vitals Details Patient Name: Date of Service: [name] 05/27/2023 7:30 [name] M Medical Record Number: [id number] Patient Account Number: [id number] Date of Birth/Sex: Treating RN: [name] (72 y.o. [name], [name] Primary Care Kolten Ryback: [name] Other Clinician: Karl [name] Referring [name]: Treating [name]/Extender: [name] Weeks in Treatment: 12 Vital Signs Time Taken: 07:59 Temperature (F): 97.3 Height (in): 67 Pulse (bpm): 73 Weight (lbs): 154 Respiratory Rate (breaths/min): 18 Body Mass Index (BMI): 24.1 Blood Pressure (mmHg): 161/85 Capillary Blood Glucose (mg/dl): 269 Reference Range: 80 - 120 mg / dl Electronic Signature(s) Signed: 05/27/2023 11:50:02 AM By: [name] EMT Entered By: [name] on 05/27/2023 11:50:02

## 2023-05-27 NOTE — Progress Notes (Signed)
AUDRIC, WEIDNER ([id number]) [id number] Page 1 of 2 Visit Report for 05/27/2023 HBO Details Patient Name: Date of Service: [name] 05/27/2023 7:30 A M Medical Record Number: [id number] Patient Account Number: [id number] Date of Birth/Sex: Treating RN: 10-29-1950 (72 y.o. [name], [name] Primary Care Adja Ruff: [name] Other Clinician: [name] Referring Cathye Kreiter: Treating Egor Fullilove/Extender: [name] in Treatment: 12 HBO Treatment Course Details Treatment Course Number: 1 Ordering Brandan Glauber: [name] T Treatments Ordered: otal 40 HBO Treatment Start Date: 04/06/2023 HBO Indication: Diabetic Ulcer(s) of the Lower Extremity Standard/Conservative Wound Care tried and failed greater than or equal to 30 days Wound #8 Right T Great oe HBO Treatment Details Treatment Number: 28 Patient Type: Outpatient Chamber Type: Monoplace Chamber Serial #: [id number] Treatment Protocol: 2.5 ATA with 90 minutes oxygen, with two 5 minute air breaks Treatment Details Compression Rate Down: 2.0 psi / minute De-Compression Rate Up: 2.0 psi / minute A breaks and breathing ir Compress Tx Pressure periods Decompress Decompress Begins Reached (leave unused spaces Begins Ends blank) Chamber Pressure (ATA 1 2.5 2.5 2.5 2.5 2.5 - - 2.5 1 ) Clock Time (24 hr) 08:20 08:34 09:04 09:09 09:39 09:44 - - 10:14 10:22 Treatment Length: 122 (minutes) Treatment Segments: 4 Vital Signs Capillary Blood Glucose Reference Range: 80 - 120 mg / dl HBO Diabetic Blood Glucose Intervention Range: <131 mg/dl or >841 mg/dl Type: Time Vitals Blood Pulse: Respiratory Temperature: Capillary Blood Glucose Pulse Action Taken: Pressure: Rate: Glucose (mg/dl): Meter #: Oximetry (%) Taken: Pre 07:59 161/85 73 18 97.3 182 Post 10:27 187/89 71 18 97.3 81 Patient given 8ozGlucerna. Waited for after. Treatment Response Treatment Toleration:  Well Treatment Completion Status: Treatment Completed without Adverse Event Treatment Notes The patient was given 8 oz of Glucerna before treatment per orders. Post treatment blood sugar was 81. The patient was given 8 oz of Glucerna and waited 15 min before leaving. Dr. Lady Gary informed. Additional Procedure Documentation Tissue Sevierity: Necrosis of bone Physician HBO Attestation: I certify that I supervised this HBO treatment in accordance with Medicare guidelines. A trained emergency response team is readily available per Yes hospital policies and procedures. Continue HBOT as ordered. Yes Electronic Signature(s) Signed: 05/27/2023 12:16:39 PM By: [name] MD FACS Previous Signature: 05/27/2023 11:55:30 AM Version By: [name] EMT Previous Signature: 05/27/2023 11:54:02 AM Version By: [name] EMT Entered By: [name] on 05/27/2023 12:16:39 Junod, [name] ([id number]) [id number] Page 2 of 2 -------------------------------------------------------------------------------- HBO Safety Checklist Details Patient Name: Date of Service: [name] 05/27/2023 7:30 A M Medical Record Number: [id number] Patient Account Number: [id number] Date of Birth/Sex: Treating RN: Jan 22, 1951 (72 y.o. [name], [name] Primary Care Amparo Donalson: [name] Other Clinician: Haywood Pao Referring Aidyn Sportsman: Treating Raesha Coonrod/Extender: [name] in Treatment: 12 HBO Safety Checklist Items Safety Checklist Consent Form Signed Patient voided / foley secured and emptied When did you last eato 0700 Last dose of injectable or oral agent 0700 22 units Novolog Ostomy pouch emptied and vented if applicable NA All implantable devices assessed, documented and approved NA Intravenous access site secured and place NA Valuables secured Linens and cotton and cotton/polyester blend (less than 51% polyester) Personal oil-based  products / skin lotions / body lotions removed Wigs or hairpieces removed NA Smoking or tobacco materials removed Books / newspapers / magazines / loose paper removed Cologne, aftershave, perfume and deodorant removed Jewelry removed (may wrap wedding band) Make-up removed NA Hair care products removed Battery operated  devices (external) removed Heating patches and chemical warmers removed Titanium eyewear removed NA Nail polish cured greater than 10 hours NA Casting material cured greater than 10 hours NA Hearing aids removed NA Loose dentures or partials removed removed by patient Prosthetics have been removed NA Patient demonstrates correct use of air break device (if applicable) Patient concerns have been addressed Patient grounding bracelet on and cord attached to chamber Specifics for Inpatients (complete in addition to above) Medication sheet sent with patient NA Intravenous medications needed or due during therapy sent with patient NA Drainage tubes (e.g. nasogastric tube or chest tube secured and vented) NA Endotracheal or Tracheotomy tube secured NA Cuff deflated of air and inflated with saline NA Airway suctioned NA Notes The safety checklist was done before the treatment was started. Electronic Signature(s) Signed: 05/27/2023 12:27:43 PM By: Haywood Pao CHT EMT BS , , Previous Signature: 05/27/2023 11:51:56 AM Version By: [name] EMT Entered By: Haywood Pao on 05/27/2023 12:27:43

## 2023-05-28 ENCOUNTER — Encounter (HOSPITAL_BASED_OUTPATIENT_CLINIC_OR_DEPARTMENT_OTHER): Payer: Medicare HMO | Admitting: General Surgery

## 2023-05-28 ENCOUNTER — Encounter (HOSPITAL_BASED_OUTPATIENT_CLINIC_OR_DEPARTMENT_OTHER): Payer: Medicare HMO | Attending: General Surgery | Admitting: General Surgery

## 2023-05-28 DIAGNOSIS — Z8546 Personal history of malignant neoplasm of prostate: Secondary | ICD-10-CM | POA: Insufficient documentation

## 2023-05-28 DIAGNOSIS — M1990 Unspecified osteoarthritis, unspecified site: Secondary | ICD-10-CM | POA: Insufficient documentation

## 2023-05-28 DIAGNOSIS — N1832 Chronic kidney disease, stage 3b: Secondary | ICD-10-CM | POA: Diagnosis not present

## 2023-05-28 DIAGNOSIS — Z833 Family history of diabetes mellitus: Secondary | ICD-10-CM | POA: Insufficient documentation

## 2023-05-28 DIAGNOSIS — M86171 Other acute osteomyelitis, right ankle and foot: Secondary | ICD-10-CM | POA: Diagnosis not present

## 2023-05-28 DIAGNOSIS — I129 Hypertensive chronic kidney disease with stage 1 through stage 4 chronic kidney disease, or unspecified chronic kidney disease: Secondary | ICD-10-CM | POA: Diagnosis not present

## 2023-05-28 DIAGNOSIS — L97514 Non-pressure chronic ulcer of other part of right foot with necrosis of bone: Secondary | ICD-10-CM | POA: Insufficient documentation

## 2023-05-28 DIAGNOSIS — E1122 Type 2 diabetes mellitus with diabetic chronic kidney disease: Secondary | ICD-10-CM | POA: Insufficient documentation

## 2023-05-28 DIAGNOSIS — E11621 Type 2 diabetes mellitus with foot ulcer: Secondary | ICD-10-CM | POA: Diagnosis not present

## 2023-05-28 DIAGNOSIS — E11319 Type 2 diabetes mellitus with unspecified diabetic retinopathy without macular edema: Secondary | ICD-10-CM | POA: Diagnosis not present

## 2023-05-28 LAB — GLUCOSE, CAPILLARY
Glucose-Capillary: 215 mg/dL — ABNORMAL HIGH (ref 70–99)
Glucose-Capillary: 205 mg/dL — ABNORMAL HIGH (ref 70–99)

## 2023-05-28 NOTE — Progress Notes (Signed)
WAYLAN, LYBBERT ([id number]) [id number] Page 1 of 2 Visit Report for 05/28/2023 Problem List Details Patient Name: Date of Service: [name] 05/28/2023 7:30 A M Medical Record Number: [id number] Patient Account Number: [id number] Date of Birth/Sex: Treating RN: 05/15/1951 (72 y.o. 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/02/2023 No Yes necrosis of bone M86.171 Other acute osteomyelitis, right ankle and foot 03/02/2023 No Yes E11.621 Type 2 diabetes mellitus with foot ulcer 03/02/2023 No Yes I10 Essential (primary) hypertension 03/02/2023 No Yes N18.32 Chronic kidney disease, stage 3b 03/02/2023 No Yes Inactive Problems Resolved Problems ICD-10 Code Description Active Date Resolved Date L97.512 Non-pressure chronic ulcer of other part of right foot with fat layer 03/09/2023 03/09/2023 exposed Electronic Signature(s) Signed: 05/28/2023 1:34:10 PM By: [name] EMT Signed: 05/28/2023 2:16:35 PM By: [name] MD FACS Entered By: [name] on 05/28/2023 13:34:10 -------------------------------------------------------------------------------- SuperBill Details Patient Name: Date of Service: [name] RD, [name] 05/28/2023 Medical Record Number: [id number] Patient Account Number: [id number] Date of Birth/Sex: Treating RN: [birth year] (72 y.o. [name] [place], [place] ([id number]) [id number] Page 2 of 2 Primary Care Provider: [name] Other Clinician: [name] Referring Provider: Treating Provider/Extender: [name] in Treatment: 12 Diagnosis Coding ICD-10 Codes Code  Description [id number] Non-pressure chronic ulcer of other part of right foot with necrosis of bone M86.171 Other acute osteomyelitis, right ankle and foot E11.621 Type 2 diabetes mellitus with foot ulcer I10 Essential (primary) hypertension N18.32 Chronic kidney disease, stage 3b Facility Procedures : CPT4 Code Description: [id number] G0277-(Facility Use Only) HBOT full body chamber, , ICD-10 Diagnosis Description E11.621 Type 2 diabetes mellitus with foot ulcer L97.514 Non-pressure chronic ulcer of other part of right foot wi M86.171 Other acute  osteomyelitis, right ankle and foot Modifier: th necrosis o Quantity: 4 f bone Physician Procedures : CPT4 Code Description Modifier [id number] - WC PHYS HYPERBARIC OXYGEN THERAPY ICD-10 Diagnosis Description E11.621 Type 2 diabetes mellitus with foot ulcer L97.514 Non-pressure chronic ulcer of other part of right foot with necrosis o M86.171 Other  acute osteomyelitis, right ankle and foot Quantity: 1 f bone Electronic Signature(s) Signed: 05/28/2023 1:34:05 PM By: [name] EMT Signed: 05/28/2023 2:16:35 PM By: [name] MD FACS Entered By: [name] on 05/28/2023 13:34:05

## 2023-05-28 NOTE — Progress Notes (Signed)
JONNATAN, APPLEBY ([id number]) [id number] Page 1 of 7 Visit Report for 05/28/2023 Arrival Information Details Patient Name: Date of Service: [name] 05/28/2023 11:15 A M Medical Record Number: [id number] Patient Account Number: [id number] Date of Birth/Sex: Treating RN: 05/20/1951 (72 y.o. M) Primary Care Peace Noyes: [name] Other Clinician: Referring Jezreel Justiniano: Treating Mckaylee Dimalanta/Extender: [name] in Treatment: 12 Visit Information History Since Last Visit All ordered tests and consults were completed: No Patient Arrived: Ambulatory Added or deleted any medications: No Arrival Time: 10:45 Any new allergies or adverse reactions: No Accompanied By: self Had a fall or experienced change in No Transfer Assistance: None activities of daily living that may affect Patient Identification Verified: Yes risk of falls: Secondary Verification Process Completed: Yes Signs or symptoms of abuse/neglect since last visito No Patient Requires Transmission-Based Precautions: No Hospitalized since last visit: No Patient Has Alerts: Yes Implantable device outside of the clinic excluding No Patient Alerts: Patient on Blood Thinner cellular tissue based products placed in the center since last visit: Has Dressing in Place as Prescribed: Yes Pain Present Now: No Electronic Signature(s) Signed: 05/28/2023 4:06:41 PM By: [name] RN, BSN Entered By: [name] on 05/28/2023 11:17:30 -------------------------------------------------------------------------------- Encounter Discharge Information Details Patient Name: Date of Service: [name], [name] 05/28/2023 11:15 A M Medical Record Number: [id number] Patient Account Number: [id number] Date of Birth/Sex: Treating RN: 24-Mar-1951 (72 y.o. [name] Primary Care Aubrianna Orchard: [name] Other Clinician: Referring Anaclara Acklin: Treating Allard Lightsey/Extender: [name] in Treatment: 12 Encounter Discharge Information Items Post Procedure Vitals Discharge Condition: Stable Temperature (F): 97.5 Ambulatory Status: Ambulatory Pulse (bpm): 76 Discharge Destination: Home Respiratory Rate (breaths/min): 18 Transportation: Private Auto Blood Pressure (mmHg): 188/89 Accompanied By: self Schedule Follow-up Appointment: Yes Clinical Summary of Care: Patient Declined Electronic Signature(s) Signed: 05/28/2023 4:06:41 PM By: [name] RN, BSN Entered By: [name] on 05/28/2023 11:44:53 Kogler, [name] ([id number]) [id number] Page 2 of 7 -------------------------------------------------------------------------------- Lower Extremity Assessment Details Patient Name: Date of Service: [name] 05/28/2023 11:15 A M Medical Record Number: [id number] Patient Account Number: [id number] Date of Birth/Sex: Treating RN: [birth year] (72 y.o. [name] Primary Care Banner Huckaba: [name] Other Clinician: Referring Juanita Devincent: Treating Arben Packman/Extender: [name]: 12 Edema Assessment Assessed: Kyra Searles: No] Franne Forts: No] Edema: [Left: N] [Right: o] Calf Left: Right: Point of Measurement: 33 cm From Medial Instep 34.5 cm Ankle Left: Right: Point of Measurement: 10 cm From Medial Instep 21.7 cm Vascular Assessment Pulses: Dorsalis Pedis Palpable: [Right:Yes] Extremity colors, hair growth, and conditions: Extremity Color: [Right:Normal] Hair Growth on Extremity: [Right:Yes] Temperature of Extremity: [Right:Warm < 3 seconds] Electronic Signature(s) Signed: 05/28/2023 4:06:41 PM By: [name] RN, BSN Entered By: [name] on 05/28/2023 11:20:29 -------------------------------------------------------------------------------- Multi Wound Chart Details Patient Name: Date of Service: [name], [name] 05/28/2023 11:15 A M Medical  Record Number: [id number] Patient Account Number: [id number] Date of Birth/Sex: Treating RN: 12-30-1950 (72 y.o. M) Primary Care Anyae Griffith: [name] Other Clinician: Referring Kalev Temme: Treating Osama Coleson/Extender: [name]: 12 Vital Signs Height(in): 67 Capillary Blood Glucose(mg/dl): 175 Weight(lbs): 102 Pulse(bpm): 76 Body Mass Index(BMI): 24.1 Blood Pressure(mmHg): 188/89 Temperature(F): 97.5 Respiratory Rate(breaths/min): 18 [8:Photos:] [N/A:N/A] Right T Great oe N/A N/A Wound Location: Gradually Appeared N/A N/A Wounding Event: Diabetic Wound/Ulcer of the Lower N/A N/A Primary Etiology: Extremity Hypertension, Type II Diabetes, N/A N/A Comorbid History: Osteoarthritis, Neuropathy, Received Radiation 02/04/2023 N/A N/A Date Acquired: 12 N/A  N/A Weeks of Treatment: Open N/A N/A Wound Status: No N/A N/A Wound Recurrence: 0.8x1x0.3 N/A N/A Measurements L x W x D (cm) 0.628 N/A N/A A (cm) : rea 0.188 N/A N/A Volume (cm) : 94.60% N/A N/A % Reduction in A rea: 91.90% N/A N/A % Reduction in Volume: Grade 3 N/A N/A Classification: Medium N/A N/A Exudate A mount: Serosanguineous N/A N/A Exudate Type: red, brown N/A N/A Exudate Color: Distinct, outline attached N/A N/A Wound Margin: Large (67-100%) N/A N/A Granulation A mount: Red, Friable N/A N/A Granulation Quality: Small (1-33%) N/A N/A Necrotic A mount: Fat Layer (Subcutaneous Tissue): Yes N/A N/A Exposed Structures: Bone: Yes Fascia: No Tendon: No Muscle: No Joint: No Medium (34-66%) N/A N/A Epithelialization: Debridement - Selective/Open Wound N/A N/A Debridement: Pre-procedure Verification/Time Out 11:25 N/A N/A Taken: Callus, Slough N/A N/A Tissue Debrided: Skin/Epidermis N/A N/A Level: 1.66 N/A N/A Debridement A (sq cm): rea Curette N/A N/A Instrument: Minimum N/A N/A Bleeding: Pressure N/A N/A Hemostasis A chieved: 0 N/A  N/A Procedural Pain: 0 N/A N/A Post Procedural Pain: Procedure was tolerated well N/A N/A Debridement Treatment Response: 1.6x1.2x0.3 N/A N/A Post Debridement Measurements L x W x D (cm) 0.452 N/A N/A Post Debridement Volume: (cm) Callus: Yes N/A N/A Periwound Skin Texture: Maceration: No N/A N/A Periwound Skin Moisture: Dry/Scaly: No No Abnormalities Noted N/A N/A Periwound Skin Color: No Abnormality N/A N/A Temperature: Debridement N/A N/A Procedures Performed: Treatment Notes Electronic Signature(s) Signed: 05/28/2023 11:33:42 AM By: [name] MD FACS Entered By: [name] on 05/28/2023 11:33:42 -------------------------------------------------------------------------------- Multi-Disciplinary Care Plan Details Patient Name: Date of Service: [name], [name] 05/28/2023 11:15 A M Medical Record Number: [id number] Patient Account Number: [id number] Date of Birth/Sex: Treating RN: Feb 01, 1951 (72 y.o. [name] Primary Care Naijah Lacek: [name] Other Clinician: Referring Vitor Overbaugh: Treating Veasna Santibanez/Extender: [name] in Treatment: [address] [place] ([id number]) [id number] Page 4 of 7 Multidisciplinary Care Plan reviewed with physician Active Inactive HBO Nursing Diagnoses: Anxiety related to knowledge deficit of hyperbaric oxygen therapy and treatment procedures Goals: Patient will tolerate the hyperbaric oxygen therapy treatment Date Initiated: 03/02/2023 Target Resolution Date: 06/26/2023 Goal Status: Active Interventions: Assess for signs and symptoms related to adverse events, including but not limited to confinement anxiety, pneumothorax, oxygen toxicity and baurotrauma Notes: Wound/Skin Impairment Nursing Diagnoses: Impaired tissue integrity Knowledge deficit related to ulceration/compromised skin integrity Goals: Patient/caregiver will verbalize understanding of skin care  regimen Date Initiated: 03/17/2023 Target Resolution Date: 06/26/2023 Goal Status: Active Ulcer/skin breakdown will have a volume reduction of 30% by week 4 Date Initiated: 03/17/2023 Date Inactivated: 04/02/2023 Target Resolution Date: 03/31/2023 Goal Status: Met Ulcer/skin breakdown will have a volume reduction of 50% by week 8 Date Initiated: 04/02/2023 Date Inactivated: 05/07/2023 Target Resolution Date: 04/30/2023 Goal Status: Met Interventions: Assess patient/caregiver ability to obtain necessary supplies Assess patient/caregiver ability to perform ulcer/skin care regimen upon admission and as needed Assess ulceration(s) every visit Treatment Activities: Skin care regimen initiated : 03/17/2023 Topical wound management initiated : 03/17/2023 Notes: Electronic Signature(s) Signed: 05/28/2023 4:06:41 PM By: [name] RN, BSN Entered By: [name] on 05/28/2023 11:23:24 -------------------------------------------------------------------------------- Pain Assessment Details Patient Name: Date of Service: [name], [name] 05/28/2023 11:15 A M Medical Record Number: [id number] Patient Account Number: [id number] Date of Birth/Sex: Treating RN: 07-Oct-1951 (72 y.o. M) Primary Care Keitha Kolk: [name] Other Clinician: Referring Calleen Alvis: Treating Encarnacion Bole/Extender: [name]: 12 Active Problems Location of Pain Severity and Description of Pain Patient  Has Paino No Site Locations Rate the pain. JUDE, SKIPTON ([id number]) [id number] Page 5 of 7 Rate the pain. Current Pain Level: 0 Pain Management and Medication Current Pain Management: Electronic Signature(s) Signed: 05/28/2023 4:06:41 PM By: [name] RN, BSN Entered By: [name] on 05/28/2023 11:18:01 -------------------------------------------------------------------------------- Patient/Caregiver Education Details Patient Name: Date of  Service: [name], [name] 8/1/2024andnbsp11:15 A M Medical Record Number: [id number] Patient Account Number: [id number] Date of Birth/Gender: Treating RN: [birth year] (72 y.o. [name] Primary Care Physician: [name] Other Clinician: Referring Physician: Treating Physician/Extender: [name] in Treatment: 12 Education Assessment Education Provided To: Patient Education Topics Provided Elevated Blood Sugar/ Impact on Healing: Methods: Explain/Verbal Responses: Reinforcements needed, State content correctly Hyperbaric Oxygenation: Methods: Explain/Verbal Responses: Reinforcements needed, State content correctly Wound/Skin Impairment: Methods: Explain/Verbal Responses: Reinforcements needed, State content correctly Electronic Signature(s) Signed: 05/28/2023 4:06:41 PM By: [name] RN, BSN Entered By: [name] on 05/28/2023 11:24:06 [name] ([id number]) [id number] Page 6 of 7 -------------------------------------------------------------------------------- Wound Assessment Details Patient Name: Date of Service: [name] 05/28/2023 11:15 A M Medical Record Number: [id number] Patient Account Number: [id number] Date of Birth/Sex: Treating RN: 18-May-1951 (72 y.o. M) Primary Care Esty Ahuja: [name] Other Clinician: Referring Emali Heyward: Treating Pretty Weltman/Extender: [name]: 12 Wound Status Wound Number: 8 Primary Diabetic Wound/Ulcer of the Lower Extremity Etiology: Wound Location: Right T Great oe Wound Open Wounding Event: Gradually Appeared Status: Date Acquired: 02/04/2023 Comorbid Hypertension, Type II Diabetes, Osteoarthritis, Neuropathy, Weeks Of Treatment: 12 History: Received Radiation Clustered Wound: No Photos Wound Measurements Length: (cm) 0 Width: (cm) 1 Depth: (cm) 0 Area: (cm) Volume: (cm) .8 % Reduction in  Area: 94.6% % Reduction in Volume: 91.9% .3 Epithelialization: Medium (34-66%) 0.628 Tunneling: No 0.188 Undermining: No Wound Description Classification: Grade 3 Wound Margin: Distinct, outline attached Exudate Amount: Medium Exudate Type: Serosanguineous Exudate Color: red, brown Foul Odor After Cleansing: No Slough/Fibrino Yes Wound Bed Granulation Amount: Large (67-100%) Exposed Structure Granulation Quality: Red, Friable Fascia Exposed: No Necrotic Amount: Small (1-33%) Fat Layer (Subcutaneous Tissue) Exposed: Yes Necrotic Quality: Adherent Slough Tendon Exposed: No Muscle Exposed: No Joint Exposed: No Bone Exposed: Yes Periwound Skin Texture Texture Color No Abnormalities Noted: No No Abnormalities Noted: Yes Callus: Yes Temperature / Pain Temperature: No Abnormality Moisture No Abnormalities Noted: No Dry / Scaly: No Maceration: No Treatment Notes Wound #8 (Toe Great) Wound Laterality: Right BAUDILIO, GAVRILOV ([id number]) [id number] Page 7 of 7 Cleanser Normal Saline Discharge Instruction: Cleanse the wound with Normal Saline prior to applying a clean dressing using gauze sponges, not tissue or cotton balls. Soap and Water Discharge Instruction: May shower and wash wound with dial antibacterial soap and water prior to dressing change. Peri-Wound Care Topical Primary Dressing Maxorb Extra Ag+ Alginate Dressing, 4x4.75 (in/in) Discharge Instruction: Apply to wound bed as instructed Secondary Dressing Woven Gauze Sponges 2x2 in Discharge Instruction: Apply over primary dressing as directed. Secured With Conforming Stretch Gauze Bandage, Sterile 2x75 (in/in) Discharge Instruction: Secure with stretch gauze as directed. Compression Wrap Compression Stockings Add-Ons Electronic Signature(s) Signed: 05/28/2023 4:06:41 PM By: [name] RN, BSN Entered By: [name] on 05/28/2023  11:22:49 -------------------------------------------------------------------------------- Vitals Details Patient Name: Date of Service: [name], [name] 05/28/2023 11:15 A M Medical Record Number: [id number] Patient Account Number: [id number] Date of Birth/Sex: Treating RN: 09/11/1951 (72 y.o. M) Primary Care Salvator Seppala: [name] Other Clinician: Referring Tomie Spizzirri: Treating Domonique Cothran/Extender: [name]: 12 Vital  Signs Time Taken: 10:45 Temperature (F): 97.5 Height (in): 67 Pulse (bpm): 76 Weight (lbs): 154 Respiratory Rate (breaths/min): 18 Body Mass Index (BMI): 24.1 Blood Pressure (mmHg): 188/89 Capillary Blood Glucose (mg/dl): 161 Reference Range: 80 - 120 mg / dl Electronic Signature(s) Signed: 05/28/2023 4:06:41 PM By: [name] RN, BSN Entered By: [name] on 05/28/2023 11:17:41

## 2023-05-28 NOTE — Progress Notes (Signed)
[place], [name] ([id number]) [id number] Page 1 of 2 Visit Report for 05/28/2023 Arrival Information Details Patient Name: Date of Service: [name] 05/28/2023 7:30 A M Medical Record Number: [id number] Patient Account Number: [id number] Date of Birth/Sex: Treating RN: [birth year] (72 y.o. [name] Primary Care Lysha Schrade: [name] Other Clinician: [name] Referring Keeanna Villafranca: Treating Takera Rayl/Extender: [name] in Treatment: 12 Visit Information History Since Last Visit All ordered tests and consults were completed: Yes Patient Arrived: Ambulatory Added or deleted any medications: No Arrival Time: 07:50 Any new allergies or adverse reactions: No Accompanied By: None Had a fall or experienced change in No Transfer Assistance: None activities of daily living that may affect Patient Identification Verified: Yes risk of falls: Secondary Verification Process Completed: Yes Signs or symptoms of abuse/neglect since last visito No Patient Requires Transmission-Based Precautions: No Hospitalized since last visit: No Patient Has Alerts: Yes Implantable device outside of the clinic excluding No Patient Alerts: Patient on Blood Thinner cellular tissue based products placed in the center since last visit: Pain Present Now: No Electronic Signature(s) Signed: 05/28/2023 1:28:21 PM By: [name] EMT Entered By: [name] on 05/28/2023 13:28:21 -------------------------------------------------------------------------------- Encounter Discharge Information Details Patient Name: Date of Service: [name] RD, [name] 05/28/2023 7:30 A M Medical Record Number: [id number] Patient Account Number: [id number] Date of Birth/Sex: Treating RN: [birth year] (72 y.o. [name] Primary Care Percilla Tweten: [name] Other Clinician: [name] Referring Kathee Tumlin: Treating Sharonica Kraszewski/Extender: [name] in Treatment: 12 Encounter Discharge Information Items Discharge Condition: Stable Ambulatory Status: Ambulatory Discharge Destination: Home Transportation: Private Auto Accompanied By: None Schedule Follow-up Appointment: Yes Clinical Summary of Care: Electronic Signature(s) Signed: 05/28/2023 1:34:40 PM By: [name] EMT Entered By: [name] on 05/28/2023 13:34:40 [place], [name] ([id number]) [id number] Page 2 of 2 -------------------------------------------------------------------------------- Vitals Details Patient Name: Date of Service: [name] 05/28/2023 7:30 A M Medical Record Number: [id number] Patient Account Number: [id number] Date of Birth/Sex: Treating RN: [birth year] (72 y.o. [name] Primary Care Temesha Queener: [name] Other Clinician: [name] Referring Delorse Shane: Treating Aaliyan Brinkmeier/Extender: [name] in Treatment: 12 Vital Signs Time Taken: 07:54 Temperature (F): 97.5 Height (in): 67 Pulse (bpm): 74 Weight (lbs): 154 Respiratory Rate (breaths/min): 18 Body Mass Index (BMI): 24.1 Blood Pressure (mmHg): 179/92 Capillary Blood Glucose (mg/dl): 854 Reference Range: 80 - 120 mg / dl Electronic Signature(s) Signed: 05/28/2023 1:28:53 PM By: [name] EMT Entered By: [name] on 05/28/2023 13:28:53

## 2023-05-28 NOTE — Progress Notes (Signed)
[place], [name] ([id number]) [id number] Page 1 of 2 Visit Report for 05/28/2023 HBO Details Patient Name: Date of Service: [name] 05/28/2023 7:30 A M Medical Record Number: [id number] Patient Account Number: [id number] Date of Birth/Sex: Treating RN: Nov 27, 1950 (72 y.o. [name] Primary Care Ivie Savitt: [name] Other Clinician: [name] Referring Adren Dollins: Treating Nyshawn Gowdy/Extender: [name] in Treatment: 12 HBO Treatment Course Details Treatment Course Number: 1 Ordering Hernan Turnage: [name] T Treatments Ordered: otal 40 HBO Treatment Start Date: 04/06/2023 HBO Indication: Diabetic Ulcer(s) of the Lower Extremity Standard/Conservative Wound Care tried and failed greater than or equal to 30 days Wound #8 Right T Great oe HBO Treatment Details Treatment Number: 29 Patient Type: Outpatient Chamber Type: Monoplace Chamber Serial #: [id number] Treatment Protocol: 2.5 ATA with 90 minutes oxygen, with two 5 minute air breaks Treatment Details Compression Rate Down: 2.0 psi / minute De-Compression Rate Up: 2.0 psi / minute A breaks and breathing ir Compress Tx Pressure periods Decompress Decompress Begins Reached (leave unused spaces Begins Ends blank) Chamber Pressure (ATA 1 2.5 2.5 2.5 2.5 2.5 - - 2.5 1 ) Clock Time (24 hr) 08:11 08:20 08:50 08:55 09:25 09:30 - - 10:00 10:07 Treatment Length: 116 (minutes) Treatment Segments: 4 Vital Signs Capillary Blood Glucose Reference Range: 80 - 120 mg / dl HBO Diabetic Blood Glucose Intervention Range: <131 mg/dl or >166 mg/dl Time Vitals Blood Respiratory Capillary Blood Glucose Pulse Action Type: Pulse: Temperature: Taken: Pressure: Rate: Glucose (mg/dl): Meter #: Oximetry (%) Taken: Pre 07:54 179/92 74 18 97.5 215 Post 10:10 186/89 76 18 97.5 205 Treatment Response Treatment Toleration: Well Treatment Completion Status: Treatment  Completed without Adverse Event Treatment Notes The patient was given 8 oz of Glucerna before treatment per orders Additional Procedure Documentation Tissue Sevierity: Necrosis of bone Physician HBO Attestation: I certify that I supervised this HBO treatment in accordance with Medicare guidelines. A trained emergency response team is readily available per Yes hospital policies and procedures. Continue HBOT as ordered. Yes Electronic Signature(s) Signed: 05/28/2023 2:17:07 PM By: [name] MD FACS Previous Signature: 05/28/2023 1:33:31 PM Version By: [name] EMT Entered By: [name] on 05/28/2023 14:17:07 Howell, Fowler ([id number]) [id number] Page 2 of 2 -------------------------------------------------------------------------------- HBO Safety Checklist Details Patient Name: Date of Service: [name] 05/28/2023 7:30 A M Medical Record Number: [id number] Patient Account Number: [id number] Date of Birth/Sex: Treating RN: [birth year] (72 y.o. [name] Primary Care Hagop Mccollam: [name] Other Clinician: [name] Referring Wilda Wetherell: Treating Deadrian Toya/Extender: [name]: 12 HBO Safety Checklist Items Safety Checklist Consent Form Signed Patient voided / foley secured and emptied When did you last eato 0700 Last dose of injectable or oral agent Yesterday 2100 22 units Novolog Ostomy pouch emptied and vented if applicable NA All implantable devices assessed, documented and approved NA Intravenous access site secured and place NA Valuables secured Linens and cotton and cotton/polyester blend (less than 51% polyester) Personal oil-based products / skin lotions / body lotions removed Wigs or hairpieces removed NA Smoking or tobacco materials removed Books / newspapers / magazines / loose paper removed Cologne, aftershave, perfume and deodorant removed Jewelry removed (may  wrap wedding band) Make-up removed NA Hair care products removed Battery operated devices (external) removed Heating patches and chemical warmers removed Titanium eyewear removed NA Nail polish cured greater than 10 hours NA Casting material cured greater than 10 hours NA Hearing aids removed NA Loose dentures or partials removed removed  by patient Prosthetics have been removed NA Patient demonstrates correct use of air break device (if applicable) Patient concerns have been addressed Patient grounding bracelet on and cord attached to chamber Specifics for Inpatients (complete in addition to above) Medication sheet sent with patient NA Intravenous medications needed or due during therapy sent with patient NA Drainage tubes (e.g. nasogastric tube or chest tube secured and vented) NA Endotracheal or Tracheotomy tube secured NA Cuff deflated of air and inflated with saline NA Airway suctioned NA Notes The safety checklist was done before the treatment was started. Electronic Signature(s) Signed: 05/28/2023 1:30:43 PM By: [name] EMT Entered By: [name] on 05/28/2023 13:30:43

## 2023-05-29 ENCOUNTER — Encounter (HOSPITAL_BASED_OUTPATIENT_CLINIC_OR_DEPARTMENT_OTHER): Payer: Medicare HMO | Admitting: General Surgery

## 2023-05-29 DIAGNOSIS — E11621 Type 2 diabetes mellitus with foot ulcer: Secondary | ICD-10-CM | POA: Diagnosis not present

## 2023-05-29 DIAGNOSIS — E11319 Type 2 diabetes mellitus with unspecified diabetic retinopathy without macular edema: Secondary | ICD-10-CM | POA: Diagnosis not present

## 2023-05-29 DIAGNOSIS — I129 Hypertensive chronic kidney disease with stage 1 through stage 4 chronic kidney disease, or unspecified chronic kidney disease: Secondary | ICD-10-CM | POA: Diagnosis not present

## 2023-05-29 DIAGNOSIS — Z833 Family history of diabetes mellitus: Secondary | ICD-10-CM | POA: Diagnosis not present

## 2023-05-29 DIAGNOSIS — N1832 Chronic kidney disease, stage 3b: Secondary | ICD-10-CM | POA: Diagnosis not present

## 2023-05-29 DIAGNOSIS — L97514 Non-pressure chronic ulcer of other part of right foot with necrosis of bone: Secondary | ICD-10-CM | POA: Diagnosis not present

## 2023-05-29 DIAGNOSIS — E1122 Type 2 diabetes mellitus with diabetic chronic kidney disease: Secondary | ICD-10-CM | POA: Diagnosis not present

## 2023-05-29 DIAGNOSIS — Z8546 Personal history of malignant neoplasm of prostate: Secondary | ICD-10-CM | POA: Diagnosis not present

## 2023-05-29 DIAGNOSIS — M86171 Other acute osteomyelitis, right ankle and foot: Secondary | ICD-10-CM | POA: Diagnosis not present

## 2023-05-29 DIAGNOSIS — M1990 Unspecified osteoarthritis, unspecified site: Secondary | ICD-10-CM | POA: Diagnosis not present

## 2023-05-29 LAB — GLUCOSE, CAPILLARY
Glucose-Capillary: 227 mg/dL — ABNORMAL HIGH (ref 70–99)
Glucose-Capillary: 196 mg/dL — ABNORMAL HIGH (ref 70–99)

## 2023-05-29 NOTE — Progress Notes (Signed)
PORCHE, [name] ([id number]) [id number] Page 1 of 2 Visit Report for 05/29/2023 HBO Details Patient Name: Date of Service: [name] 05/29/2023 7:30 A M Medical Record Number: [id number] Patient Account Number: [id number] Date of Birth/Sex: Treating RN: [birth year] (72 y.o. [name] Primary Care : [name] Other Clinician: [name] Referring : Treating /Extender: [name] in Treatment: 12 HBO Treatment Course Details Treatment Course Number: 1 Ordering : [name] T Treatments Ordered: otal 40 HBO Treatment Start Date: 04/06/2023 HBO Indication: Diabetic Ulcer(s) of the Lower Extremity Standard/Conservative Wound Care tried and failed greater than or equal to 30 days Wound #8 Right T Great oe HBO Treatment Details Treatment Number: 30 Patient Type: Outpatient Chamber Type: Monoplace Chamber Serial #: [id number] Treatment Protocol: 2.5 ATA with 90 minutes oxygen, with two 5 minute air breaks Treatment Details Compression Rate Down: 2.0 psi / minute De-Compression Rate Up: 2.0 psi / minute A breaks and breathing ir Compress Tx Pressure periods Decompress Decompress Begins Reached (leave unused spaces Begins Ends blank) Chamber Pressure (ATA 1 2.5 2.5 2.5 2.5 2.5 - - 2.5 1 ) Clock Time (24 hr) 08:18 08:34 09:04 09:09 09:39 09:44 - - 10:14 10:24 Treatment Length: 126 (minutes) Treatment Segments: 4 Vital Signs Capillary Blood Glucose Reference Range: 80 - 120 mg / dl HBO Diabetic Blood Glucose Intervention Range: <131 mg/dl or >564 mg/dl Time Vitals Blood Respiratory Capillary Blood Glucose Pulse Action Type: Pulse: Temperature: Taken: Pressure: Rate: Glucose (mg/dl): Meter #: Oximetry (%) Taken: Pre 08:07 145/96 82 18 97.8 227 Post 10:29 205/97 86 18 98.2 196 Treatment Response Treatment Toleration: Well Treatment Completion Status: Treatment  Completed without Adverse Event Additional Procedure Documentation Tissue Sevierity: Necrosis of bone Physician HBO Attestation: I certify that I supervised this HBO treatment in accordance with Medicare guidelines. A trained emergency response team is readily available per Yes hospital policies and procedures. Continue HBOT as ordered. Yes Electronic Signature(s) Signed: 05/29/2023 11:56:07 AM By: [name] MD FACS Previous Signature: 05/29/2023 11:40:24 AM Version By: [name] EMT Entered By: [name] on 05/29/2023 11:56:07 [name], [name] ([id number]) [id number] Page 2 of 2 -------------------------------------------------------------------------------- HBO Safety Checklist Details Patient Name: Date of Service: [name] 05/29/2023 7:30 A M Medical Record Number: [id number] Patient Account Number: [id number] Date of Birth/Sex: Treating RN: 02/19/1951 (72 y.o. [name] Primary Care : [name] Other Clinician: [name] Referring : Treating /Extender: [name] in Treatment: 12 HBO Safety Checklist Items Safety Checklist Consent Form Signed Patient voided / foley secured and emptied When did you last eato 0700 Last dose of injectable or oral agent yesterday 1900/ 22 units Novalog Ostomy pouch emptied and vented if applicable NA All implantable devices assessed, documented and approved NA Intravenous access site secured and place NA Valuables secured Linens and cotton and cotton/polyester blend (less than 51% polyester) Personal oil-based products / skin lotions / body lotions removed Wigs or hairpieces removed NA Smoking or tobacco materials removed Books / newspapers / magazines / loose paper removed Cologne, aftershave, perfume and deodorant removed Jewelry removed (may wrap wedding band) Make-up removed NA Hair care products removed Battery  operated devices (external) removed Heating patches and chemical warmers removed Titanium eyewear removed NA Nail polish cured greater than 10 hours NA Casting material cured greater than 10 hours NA Hearing aids removed NA Loose dentures or partials removed removed by patient Prosthetics have been removed NA Patient demonstrates correct use of air break  device (if applicable) Patient concerns have been addressed Patient grounding bracelet on and cord attached to chamber Specifics for Inpatients (complete in addition to above) Medication sheet sent with patient NA Intravenous medications needed or due during therapy sent with patient NA Drainage tubes (e.g. nasogastric tube or chest tube secured and vented) NA Endotracheal or Tracheotomy tube secured NA Cuff deflated of air and inflated with saline NA Airway suctioned NA Notes The safety checklist was done before the treatment was started. Electronic Signature(s) Signed: 05/29/2023 11:39:02 AM By: [name] EMT Entered By: [name] on 05/29/2023 11:39:02

## 2023-05-29 NOTE — Progress Notes (Signed)
ZAKAR, BROSCH ([id number]) [id number] Page 1 of 2 Visit Report for 05/29/2023 Arrival Information Details Patient Name: Date of Service: [name] 05/29/2023 7:30 [name] M Medical Record Number: [id number] Patient Account Number: [id number] Date of Birth/Sex: Treating RN: August 23, 1951 (72 y.o. [name] Primary Care : [name] Other Clinician: [name] Referring : Treating /Extender: [name] in Treatment: 12 Visit Information History Since Last Visit All ordered tests and consults were completed: Yes Patient Arrived: Ambulatory Added or deleted any medications: No Arrival Time: 07:55 Any new allergies or adverse reactions: No Accompanied By: None Had [name] fall or experienced change in No Transfer Assistance: None activities of daily living that may affect Patient Identification Verified: Yes risk of falls: Secondary Verification Process Completed: Yes Signs or symptoms of abuse/neglect since last visito No Patient Requires Transmission-Based Precautions: No Hospitalized since last visit: No Patient Has Alerts: Yes Implantable device outside of the clinic excluding No Patient Alerts: Patient on Blood Thinner cellular tissue based products placed in the center since last visit: Pain Present Now: No Electronic Signature(s) Signed: 05/29/2023 11:36:46 AM By: [name] EMT Entered By: [name] on 05/29/2023 11:36:46 -------------------------------------------------------------------------------- Encounter Discharge Information Details Patient Name: Date of Service: [name], [name] 05/29/2023 7:30 [name] M Medical Record Number: [id number] Patient Account Number: [id number] Date of Birth/Sex: Treating RN: 1951-04-04 (72 y.o. [name] Primary Care : [name] Other Clinician: [name] Referring : Treating /Extender: [name] in Treatment: 12 Encounter Discharge Information Items Discharge Condition: Stable Ambulatory Status: Ambulatory Discharge Destination: Home Transportation: Private Auto Accompanied By: None Schedule Follow-up Appointment: Yes Clinical Summary of Care: Electronic Signature(s) Signed: 05/29/2023 11:41:35 AM By: [name] EMT Entered By: [name] on 05/29/2023 11:41:35 [name], [name] ([id number]) [id number] Page 2 of 2 -------------------------------------------------------------------------------- Vitals Details Patient Name: Date of Service: [name] 05/29/2023 7:30 [name] M Medical Record Number: [id number] Patient Account Number: [id number] Date of Birth/Sex: Treating RN: [birth year] (72 y.o. [name] Primary Care : [name] Other Clinician: [name] Referring : Treating /Extender: [name] in Treatment: 12 Vital Signs Time Taken: 08:07 Temperature (F): 97.8 Height (in): 67 Pulse (bpm): 82 Weight (lbs): 154 Respiratory Rate (breaths/min): 18 Body Mass Index (BMI): 24.1 Blood Pressure (mmHg): 145/96 Capillary Blood Glucose (mg/dl): 427 Reference Range: 80 - 120 mg / dl Electronic Signature(s) Signed: 05/29/2023 11:37:16 AM By: [name] EMT Entered By: [name] on 05/29/2023 11:37:16

## 2023-05-29 NOTE — Progress Notes (Signed)
JURIS, GOSNELL ([id number]) [id number] Page 1 of 2 Visit Report for 05/29/2023 Problem List Details Patient Name: Date of Service: [name] 05/29/2023 7:30 A M Medical Record Number: [id number] Patient Account Number: [id number] Date of Birth/Sex: Treating RN: 1950/11/02 (72 y.o. 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/2023 No Yes necrosis of bone M86.171 Other acute osteomyelitis, right ankle and foot 03/02/2023 No Yes E11.621 Type 2 diabetes mellitus with foot ulcer 03/02/2023 No Yes I10 Essential (primary) hypertension 03/02/2023 No Yes N18.32 Chronic kidney disease, stage 3b 03/02/2023 No Yes Inactive Problems Resolved Problems ICD-10 Code Description Active Date Resolved Date L97.512 Non-pressure chronic ulcer of other part of right foot with fat layer 03/09/2023 03/09/2023 exposed Electronic Signature(s) Signed: 05/29/2023 11:40:59 AM By: [name] EMT Signed: 05/29/2023 11:55:34 AM By: [name] MD FACS Entered By: [name] on 05/29/2023 11:40:59 -------------------------------------------------------------------------------- SuperBill Details Patient Name: Date of Service: [name], [name] 05/29/2023 Medical Record Number: [id number] Patient Account Number: [id number] Date of Birth/Sex: Treating RN: August 31, 1951 (72 y.o. [name] [name], [name] ([id number]) [id number].pdf Page 2 of 2 Primary Care Provider: [name] Other Clinician: [name] Referring Provider: Treating Provider/Extender: [name] in Treatment: 12 Diagnosis Coding ICD-10 Codes Code  Description [phone] Non-pressure chronic ulcer of other part of right foot with necrosis of bone M86.171 Other acute osteomyelitis, right ankle and foot E11.621 Type 2 diabetes mellitus with foot ulcer I10 Essential (primary) hypertension N18.32 Chronic kidney disease, stage 3b Facility Procedures : CPT4 Code Description: [id number] G0277-(Facility Use Only) HBOT full body chamber, , ICD-10 Diagnosis Description E11.621 Type 2 diabetes mellitus with foot ulcer L97.514 Non-pressure chronic ulcer of other part of right foot wi M86.171 Other acute  osteomyelitis, right ankle and foot Modifier: th necrosis o Quantity: 4 f bone Physician Procedures : CPT4 Code Description Modifier [id number] - WC PHYS HYPERBARIC OXYGEN THERAPY ICD-10 Diagnosis Description E11.621 Type 2 diabetes mellitus with foot ulcer L97.514 Non-pressure chronic ulcer of other part of right foot with necrosis o M86.171 Other  acute osteomyelitis, right ankle and foot Quantity: 1 f bone Electronic Signature(s) Signed: 05/29/2023 11:40:54 AM By: [name] EMT Signed: 05/29/2023 11:55:34 AM By: [name] MD FACS Entered By: [name] on 05/29/2023 11:40:52

## 2023-06-01 ENCOUNTER — Encounter (HOSPITAL_BASED_OUTPATIENT_CLINIC_OR_DEPARTMENT_OTHER): Payer: Medicare HMO | Admitting: General Surgery

## 2023-06-02 ENCOUNTER — Encounter (HOSPITAL_BASED_OUTPATIENT_CLINIC_OR_DEPARTMENT_OTHER): Payer: Medicare HMO | Admitting: General Surgery

## 2023-06-02 DIAGNOSIS — I129 Hypertensive chronic kidney disease with stage 1 through stage 4 chronic kidney disease, or unspecified chronic kidney disease: Secondary | ICD-10-CM | POA: Diagnosis not present

## 2023-06-02 DIAGNOSIS — M86171 Other acute osteomyelitis, right ankle and foot: Secondary | ICD-10-CM | POA: Diagnosis not present

## 2023-06-02 DIAGNOSIS — Z8546 Personal history of malignant neoplasm of prostate: Secondary | ICD-10-CM | POA: Diagnosis not present

## 2023-06-02 DIAGNOSIS — E11621 Type 2 diabetes mellitus with foot ulcer: Secondary | ICD-10-CM | POA: Diagnosis not present

## 2023-06-02 DIAGNOSIS — Z833 Family history of diabetes mellitus: Secondary | ICD-10-CM | POA: Diagnosis not present

## 2023-06-02 DIAGNOSIS — E1122 Type 2 diabetes mellitus with diabetic chronic kidney disease: Secondary | ICD-10-CM | POA: Diagnosis not present

## 2023-06-02 DIAGNOSIS — L97514 Non-pressure chronic ulcer of other part of right foot with necrosis of bone: Secondary | ICD-10-CM | POA: Diagnosis not present

## 2023-06-02 DIAGNOSIS — M1990 Unspecified osteoarthritis, unspecified site: Secondary | ICD-10-CM | POA: Diagnosis not present

## 2023-06-02 DIAGNOSIS — E11319 Type 2 diabetes mellitus with unspecified diabetic retinopathy without macular edema: Secondary | ICD-10-CM | POA: Diagnosis not present

## 2023-06-02 DIAGNOSIS — N1832 Chronic kidney disease, stage 3b: Secondary | ICD-10-CM | POA: Diagnosis not present

## 2023-06-02 LAB — GLUCOSE, CAPILLARY
Glucose-Capillary: 190 mg/dL — ABNORMAL HIGH (ref 70–99)
Glucose-Capillary: 247 mg/dL — ABNORMAL HIGH (ref 70–99)

## 2023-06-02 NOTE — Progress Notes (Addendum)
[place], [name] ([id number]) [id number] Page 1 of 2 Visit Report for 06/02/2023 HBO Details Patient Name: Date of Service: [name] 06/02/2023 7:30 A M Medical Record Number: [id number] Patient Account Number: [id number] Date of Birth/Sex: Treating RN: [birth year] (72 y.o. [name] Primary Care : [name] Other Clinician: [name] Referring : Treating /Extender: [name] in Treatment: 13 HBO Treatment Course Details Treatment Course Number: 1 Ordering : [name] T Treatments Ordered: otal 40 HBO Treatment Start Date: 04/06/2023 HBO Indication: Diabetic Ulcer(s) of the Lower Extremity Standard/Conservative Wound Care tried and failed greater than or equal to 30 days Wound #8 Right T Great oe HBO Treatment Details Treatment Number: 31 Patient Type: Outpatient Chamber Type: Monoplace Chamber Serial #: [id number] Treatment Protocol: 2.5 ATA with 90 minutes oxygen, with two 5 minute air breaks Treatment Details Compression Rate Down: 1.5 psi / minute De-Compression Rate Up: A breaks and breathing ir Compress Tx Pressure periods Decompress Decompress Begins Reached (leave unused spaces Begins Ends blank) Chamber Pressure (ATA 1 2.5 2.5 2.5 2.5 2.5 - - 2.5 1 ) Clock Time (24 hr) 08:10 08:26 08:56 09:01 09:31 09:36 - - 10:06 10:19 Treatment Length: 129 (minutes) Treatment Segments: 4 Vital Signs Capillary Blood Glucose Reference Range: 80 - 120 mg / dl HBO Diabetic Blood Glucose Intervention Range: <131 mg/dl or >474 mg/dl Time Vitals Blood Respiratory Capillary Blood Glucose Pulse Action Type: Pulse: Temperature: Taken: Pressure: Rate: Glucose (mg/dl): Meter #: Oximetry (%) Taken: Pre 07:52 166/97 76 18 97.3 190 Post 10:21 195/92 77 18 97.2 247 Treatment Response Treatment Toleration: Well Treatment Completion Status: Treatment Completed without  Adverse Event Treatment Notes The patient was given 8 oz of Glucerna per orders before the treatment was started. Additional Procedure Documentation Tissue Sevierity: Necrosis of bone Physician HBO Attestation: I certify that I supervised this HBO treatment in accordance with Medicare guidelines. A trained emergency response team is readily available per Yes hospital policies and procedures. Continue HBOT as ordered. Yes Electronic Signature(s) Signed: 06/02/2023 12:07:42 PM By: [name] MD FACS Previous Signature: 06/02/2023 11:15:45 AM Version By: [name] EMT Previous Signature: 06/02/2023 9:21:06 AM Version By: [name] EMT Entered By: [name] on 06/02/2023 12:07:42 [place], [name] ([id number]) [id number] Page 2 of 2 -------------------------------------------------------------------------------- HBO Safety Checklist Details Patient Name: Date of Service: [name] 06/02/2023 7:30 A M Medical Record Number: [id number] Patient Account Number: [id number] Date of Birth/Sex: Treating RN: 1950-12-22 (72 y.o. [name] Primary Care : [name] Other Clinician: [name] Referring : Treating /Extender: [name] in Treatment: 13 HBO Safety Checklist Items Safety Checklist Consent Form Signed Patient voided / foley secured and emptied When did you last eato 0700 Last dose of injectable or oral agent 1900 Last night 22 units Novalog Ostomy pouch emptied and vented if applicable NA All implantable devices assessed, documented and approved NA Intravenous access site secured and place NA Valuables secured Linens and cotton and cotton/polyester blend (less than 51% polyester) Personal oil-based products / skin lotions / body lotions removed Wigs or hairpieces removed NA Smoking or tobacco materials removed Books / newspapers / magazines / loose paper  removed Cologne, aftershave, perfume and deodorant removed Jewelry removed (may wrap wedding band) Make-up removed NA Hair care products removed Battery operated devices (external) removed Heating patches and chemical warmers removed Titanium eyewear removed NA Nail polish cured greater than 10 hours NA Casting material cured greater than 10 hours NA  Hearing aids removed NA Loose dentures or partials removed removed by patient Prosthetics have been removed NA Patient demonstrates correct use of air break device (if applicable) Patient concerns have been addressed Patient grounding bracelet on and cord attached to chamber Specifics for Inpatients (complete in addition to above) Medication sheet sent with patient NA Intravenous medications needed or due during therapy sent with patient NA Drainage tubes (e.g. nasogastric tube or chest tube secured and vented) NA Endotracheal or Tracheotomy tube secured NA Cuff deflated of air and inflated with saline NA Airway suctioned NA Notes The safety checklist was done before the treatment was started. Electronic Signature(s) Signed: 06/02/2023 9:19:36 AM By: [name] EMT Entered By: [name] on 06/02/2023 09:19:36

## 2023-06-02 NOTE — Progress Notes (Addendum)
RECHARD, STFORT ([id number]) [id number] Page 1 of 2 Visit Report for 06/02/2023 Arrival Information Details Patient Name: Date of Service: [name] 06/02/2023 7:30 A M Medical Record Number: [id number] Patient Account Number: [id number] Date of Birth/Sex: Treating RN: 1951-05-21 (72 y.o. [name] Primary Care : [name] Other Clinician: Karl [name] Referring : Treating /Extender: [name] in Treatment: 13 Visit Information History Since Last Visit All ordered tests and consults were completed: Yes Patient Arrived: Ambulatory Added or deleted any medications: No Arrival Time: 07:48 Any new allergies or adverse reactions: No Accompanied By: None Had a fall or experienced change in No Transfer Assistance: None activities of daily living that may affect Patient Identification Verified: Yes risk of falls: Secondary Verification Process Completed: Yes Signs or symptoms of abuse/neglect since last visito No Patient Requires Transmission-Based Precautions: No Hospitalized since last visit: No Patient Has Alerts: Yes Implantable device outside of the clinic excluding No Patient Alerts: Patient on Blood Thinner cellular tissue based products placed in the center since last visit: Pain Present Now: No Electronic Signature(s) Signed: 06/02/2023 9:16:48 AM By: [name] EMT Entered By: [name] on 06/02/2023 09:16:48 -------------------------------------------------------------------------------- Encounter Discharge Information Details Patient Name: Date of Service: [name], [name] 06/02/2023 7:30 A M Medical Record Number: [id number] Patient Account Number: [id number] Date of Birth/Sex: Treating RN: [birth year] (72 y.o. [name] Primary Care : [name] Other Clinician: Karl [name] Referring : Treating /Extender: [name] in Treatment: 13 Encounter Discharge Information Items Discharge Condition: Stable Ambulatory Status: Ambulatory Discharge Destination: Home Transportation: Private Auto Accompanied By: None Schedule Follow-up Appointment: Yes Clinical Summary of Care: Electronic Signature(s) Signed: 06/02/2023 11:17:51 AM By: [name] EMT Entered By: [name] on 06/02/2023 11:17:50 [name], [name] ([id number]) [id number] Page 2 of 2 -------------------------------------------------------------------------------- Vitals Details Patient Name: Date of Service: [name] 06/02/2023 7:30 A M Medical Record Number: [id number] Patient Account Number: [id number] Date of Birth/Sex: Treating RN: [birth year] (72 y.o. [name] Primary Care : [name] Other Clinician: Karl [name] Referring : Treating /Extender: [name] in Treatment: 13 Vital Signs Time Taken: 07:52 Temperature (F): 97.3 Height (in): 67 Pulse (bpm): 76 Weight (lbs): 154 Respiratory Rate (breaths/min): 18 Body Mass Index (BMI): 24.1 Blood Pressure (mmHg): 166/97 Capillary Blood Glucose (mg/dl): 756 Reference Range: 80 - 120 mg / dl Electronic Signature(s) Signed: 06/02/2023 9:17:27 AM By: [name] EMT Entered By: [name] on 06/02/2023 09:17:27

## 2023-06-02 NOTE — Progress Notes (Signed)
LABRYAN, RAMAKRISHNAN ([id number]) [id number] Page 1 of 2 Visit Report for 06/02/2023 Problem List Details Patient Name: Date of Service: [name] 06/02/2023 7:30 A M Medical Record Number: [id number] Patient Account Number: [id number] Date of Birth/Sex: Treating RN: December 18, 1950 (72 y.o. 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 M86.171 Other acute osteomyelitis, right ankle and foot 03/02/2023 No Yes E11.621 Type 2 diabetes mellitus with foot ulcer 03/02/2023 No Yes I10 Essential (primary) hypertension 03/02/2023 No Yes N18.32 Chronic kidney disease, stage 3b 03/02/2023 No Yes Inactive Problems Resolved Problems ICD-10 Code Description Active Date Resolved Date L97.512 Non-pressure chronic ulcer of other part of right foot with fat layer 03/09/2023 03/09/2023 exposed Electronic Signature(s) Signed: 06/02/2023 11:17:16 AM By: [name] EMT Signed: 06/02/2023 12:07:14 PM By: [name] MD FACS Entered By: [name] on 06/02/2023 11:17:16 -------------------------------------------------------------------------------- SuperBill Details Patient Name: Date of Service: [name] RD, [name] 06/02/2023 Medical Record Number: [id number] Patient Account Number: [id number] Date of Birth/Sex: Treating RN: [birth year] (72 y.o. [name] [name], [name] ([id number]) [phone].pdf Page 2 of 2 Primary Care Provider: [name] Other Clinician: [name] Referring Provider: Treating Provider/Extender: [name] in Treatment: 13 Diagnosis Coding ICD-10 Codes Code  Description [phone] Non-pressure chronic ulcer of other part of right foot with necrosis of bone M86.171 Other acute osteomyelitis, right ankle and foot E11.621 Type 2 diabetes mellitus with foot ulcer I10 Essential (primary) hypertension N18.32 Chronic kidney disease, stage 3b Facility Procedures : CPT4 Code Description: [id number] G0277-(Facility Use Only) HBOT full body chamber, , ICD-10 Diagnosis Description E11.621 Type 2 diabetes mellitus with foot ulcer L97.514 Non-pressure chronic ulcer of other part of right foot wi M86.171 Other acute  osteomyelitis, right ankle and foot Modifier: th necrosis o Quantity: 4 f bone Physician Procedures : CPT4 Code Description Modifier [id number] 99183 - WC PHYS HYPERBARIC OXYGEN THERAPY ICD-10 Diagnosis Description E11.621 Type 2 diabetes mellitus with foot ulcer L97.514 Non-pressure chronic ulcer of other part of right foot with necrosis o M86.171 Other  acute osteomyelitis, right ankle and foot Quantity: 1 f bone Electronic Signature(s) Signed: 06/02/2023 11:17:09 AM By: [name] EMT Signed: 06/02/2023 12:07:14 PM By: [name] MD FACS Entered By: [name] on 06/02/2023 11:17:08

## 2023-06-03 ENCOUNTER — Encounter (HOSPITAL_BASED_OUTPATIENT_CLINIC_OR_DEPARTMENT_OTHER): Payer: Medicare HMO | Admitting: Physician Assistant

## 2023-06-04 ENCOUNTER — Encounter (HOSPITAL_BASED_OUTPATIENT_CLINIC_OR_DEPARTMENT_OTHER): Payer: Medicare HMO | Admitting: General Surgery

## 2023-06-05 ENCOUNTER — Encounter (HOSPITAL_BASED_OUTPATIENT_CLINIC_OR_DEPARTMENT_OTHER): Payer: Medicare HMO | Admitting: General Surgery

## 2023-06-05 DIAGNOSIS — M86171 Other acute osteomyelitis, right ankle and foot: Secondary | ICD-10-CM | POA: Diagnosis not present

## 2023-06-05 DIAGNOSIS — I129 Hypertensive chronic kidney disease with stage 1 through stage 4 chronic kidney disease, or unspecified chronic kidney disease: Secondary | ICD-10-CM | POA: Diagnosis not present

## 2023-06-05 DIAGNOSIS — M1990 Unspecified osteoarthritis, unspecified site: Secondary | ICD-10-CM | POA: Diagnosis not present

## 2023-06-05 DIAGNOSIS — E11621 Type 2 diabetes mellitus with foot ulcer: Secondary | ICD-10-CM | POA: Diagnosis not present

## 2023-06-05 DIAGNOSIS — E11319 Type 2 diabetes mellitus with unspecified diabetic retinopathy without macular edema: Secondary | ICD-10-CM | POA: Diagnosis not present

## 2023-06-05 DIAGNOSIS — L97514 Non-pressure chronic ulcer of other part of right foot with necrosis of bone: Secondary | ICD-10-CM | POA: Diagnosis not present

## 2023-06-05 DIAGNOSIS — Z833 Family history of diabetes mellitus: Secondary | ICD-10-CM | POA: Diagnosis not present

## 2023-06-05 DIAGNOSIS — N1832 Chronic kidney disease, stage 3b: Secondary | ICD-10-CM | POA: Diagnosis not present

## 2023-06-05 DIAGNOSIS — E1122 Type 2 diabetes mellitus with diabetic chronic kidney disease: Secondary | ICD-10-CM | POA: Diagnosis not present

## 2023-06-05 DIAGNOSIS — Z8546 Personal history of malignant neoplasm of prostate: Secondary | ICD-10-CM | POA: Diagnosis not present

## 2023-06-05 DIAGNOSIS — L97512 Non-pressure chronic ulcer of other part of right foot with fat layer exposed: Secondary | ICD-10-CM | POA: Diagnosis not present

## 2023-06-05 LAB — GLUCOSE, CAPILLARY
Glucose-Capillary: 190 mg/dL — ABNORMAL HIGH (ref 70–99)
Glucose-Capillary: 244 mg/dL — ABNORMAL HIGH (ref 70–99)

## 2023-06-05 NOTE — Progress Notes (Addendum)
[place], Jobe [name] ([id number]) [id number] Page 1 of [name] Visit Report for 06/05/2023 HBO Details Patient Name: Date of Service: [name] 06/05/2023 7:30 A M Medical Record Number: [id number] Patient Account Number: [id number] Date of Birth/Sex: Treating RN: April 16, 1951 (72 y.o. [name] Primary Care : [name] Other Clinician: [name] Referring : Treating /Extender: [name] in Treatment: 13 HBO Treatment Course Details Treatment Course Number: 1 Ordering : [name] T Treatments Ordered: otal 40 HBO Treatment Start Date: 04/06/2023 HBO Indication: Diabetic Ulcer(s) of the Lower Extremity Standard/Conservative Wound Care tried and failed greater than or equal to 30 days Wound #8 Right T Great oe HBO Treatment Details Treatment Number: 32 Patient Type: Outpatient Chamber Type: Monoplace Chamber Serial #: [id number] Treatment Protocol: [name].5 ATA with 90 minutes oxygen, with two 5 minute air breaks Treatment Details Compression Rate Down: [name].0 psi / minute De-Compression Rate Up: A breaks and breathing ir Compress Tx Pressure periods Decompress Decompress Begins Reached (leave unused spaces Begins Ends blank) Chamber Pressure (ATA 1 [name].5 [name].5 [name].5 [name].5 [name].5 - - [name].5 1 ) Clock Time (24 hr) 08:09 08:25 08:55 09:00 09:31 09:36 - - 10:06 10:16 Treatment Length: 127 (minutes) Treatment Segments: 4 Vital Signs Capillary Blood Glucose Reference Range: 80 - 120 mg / dl HBO Diabetic Blood Glucose Intervention Range: <131 mg/dl or >324 mg/dl Time Vitals Blood Respiratory Capillary Blood Glucose Pulse Action Type: Pulse: Temperature: Taken: Pressure: Rate: Glucose (mg/dl): Meter #: Oximetry (%) Taken: Pre 07:43 143/91 76 18 97 190 Post 10:20 218/96 76 18 97.9 244 Treatment Response Treatment Toleration: Well Treatment Completion Status: Treatment Completed without Adverse  Event Treatment Notes Dr. Lady Gary informed of the patient post treatment blood pressure. Additional Procedure Documentation Tissue Sevierity: Necrosis of bone Physician HBO Attestation: I certify that I supervised this HBO treatment in accordance with Medicare guidelines. A trained emergency response team is readily available per Yes hospital policies and procedures. Continue HBOT as ordered. Yes Electronic Signature(s) Signed: 06/05/2023 12:33:32 PM By: [name] MD FACS Previous Signature: 06/05/2023 10:45:10 AM Version By: [name] EMT Previous Signature: 06/05/2023 9:30:45 AM Version By: [name] EMT Entered By: [name] on 06/05/2023 12:33:31 [name] -------------------------------------------------------------------------------- HBO Safety Checklist Details Patient Name: Date of Service: [name] 06/05/2023 7:30 A M Medical Record Number: [id number] Patient Account Number: [id number] Date of Birth/Sex: Treating RN: [birth year] (72 y.o. [name] Primary Care : [name] Other Clinician: [name] Referring : Treating /Extender: [name]: 13 HBO Safety Checklist Items Safety Checklist Consent Form Signed Patient voided / foley secured and emptied When did you last eato 0700 Last dose of injectable or oral agent Yesterday 22 units Novalog Ostomy pouch emptied and vented if applicable NA All implantable devices assessed, documented and approved NA Intravenous access site secured and place NA Valuables secured Linens and cotton and cotton/polyester blend (less than 51% polyester) Personal oil-based products / skin lotions / body lotions removed Wigs or hairpieces removed NA Smoking or tobacco materials removed Books / newspapers / magazines / loose paper removed Cologne, aftershave, perfume and  deodorant removed Jewelry removed (may wrap wedding band) Make-up removed NA Hair care products removed Battery operated devices (external) removed Heating patches and chemical warmers removed Titanium eyewear removed NA Nail polish cured greater than 10 hours NA Casting material cured greater than 10 hours NA Hearing aids removed NA Loose dentures or  partials removed removed by patient Prosthetics have been removed NA Patient demonstrates correct use of air break device (if applicable) Patient concerns have been addressed Patient grounding bracelet on and cord attached to chamber Specifics for Inpatients (complete in addition to above) Medication sheet sent with patient NA Intravenous medications needed or due during therapy sent with patient NA Drainage tubes (e.g. nasogastric tube or chest tube secured and vented) NA Endotracheal or Tracheotomy tube secured NA Cuff deflated of air and inflated with saline NA Airway suctioned NA Notes The safety checklist was done before the treatment was started. Electronic Signature(s) Signed: 06/05/2023 9:30:01 AM By: [name] EMT Entered By: [name] on 06/05/2023 09:30:00

## 2023-06-05 NOTE — Progress Notes (Signed)
ISSAM, COMMINGS ([id number]) [id number] Page 1 of 2 Visit Report for 06/05/2023 Problem List Details Patient Name: Date of Service: [name] 06/05/2023 7:30 A M Medical Record Number: [id number] Patient Account Number: [id number] Date of Birth/Sex: Treating RN: 1951/08/21 (72 y.o. 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 M86.171 Other acute osteomyelitis, right ankle and foot 03/02/2023 No Yes E11.621 Type 2 diabetes mellitus with foot ulcer 03/02/2023 No Yes I10 Essential (primary) hypertension 03/02/2023 No Yes N18.32 Chronic kidney disease, stage 3b 03/02/2023 No Yes Inactive Problems Resolved Problems ICD-10 Code Description Active Date Resolved Date L97.512 Non-pressure chronic ulcer of other part of right foot with fat layer 03/09/2023 03/09/2023 exposed Electronic Signature(s) Signed: 06/05/2023 10:46:15 AM By: [name] EMT Signed: 06/05/2023 12:32:47 PM By: [name] MD FACS Entered By: [name] on 06/05/2023 10:46:15 -------------------------------------------------------------------------------- SuperBill Details Patient Name: Date of Service: [name] RD, [name] 06/05/2023 Medical Record Number: [id number] Patient Account Number: [id number] Date of Birth/Sex: Treating RN: 08-Jan-1951 (72 y.o. [name], [name] ([id number]) [id number] Page 2 of 2 Primary Care Provider: [name] Other Clinician: [name] Referring Provider: Treating Provider/Extender: [name] in Treatment: 13 Diagnosis Coding ICD-10 Codes Code  Description [phone] Non-pressure chronic ulcer of other part of right foot with necrosis of bone M86.171 Other acute osteomyelitis, right ankle and foot E11.621 Type 2 diabetes mellitus with foot ulcer I10 Essential (primary) hypertension N18.32 Chronic kidney disease, stage 3b Facility Procedures : CPT4 Code Description: [id number] G0277-(Facility Use Only) HBOT full body chamber, , ICD-10 Diagnosis Description E11.621 Type 2 diabetes mellitus with foot ulcer L97.514 Non-pressure chronic ulcer of other part of right foot wi M86.171 Other acute  osteomyelitis, right ankle and foot Modifier: th necrosis o Quantity: 4 f bone Physician Procedures : CPT4 Code Description Modifier [id number] - WC PHYS HYPERBARIC OXYGEN THERAPY ICD-10 Diagnosis Description E11.621 Type 2 diabetes mellitus with foot ulcer L97.514 Non-pressure chronic ulcer of other part of right foot with necrosis o M86.171 Other  acute osteomyelitis, right ankle and foot Quantity: 1 f bone Electronic Signature(s) Signed: 06/05/2023 10:46:00 AM By: [name] EMT Signed: 06/05/2023 12:32:47 PM By: [name] MD FACS Entered By: [name] on 06/05/2023 10:46:00

## 2023-06-05 NOTE — Progress Notes (Signed)
AIDDEN, ARBUCKLE ([id number]) [id number] Page 1 of 7 Visit Report for 06/05/2023 Arrival Information Details Patient Name: Date of Service: [name] 06/05/2023 10:00 [name] M Medical Record Number: [id number] Patient Account Number: [id number] Date of Birth/Sex: Treating RN: 11-Mar-1951 (72 y.o. M) Primary Care : [name] Other Clinician: Referring : Treating /Extender: [name] in Treatment: 13 Visit Information History Since Last Visit All ordered tests and consults were completed: No Patient Arrived: Ambulatory Added or deleted any medications: No Arrival Time: 10:54 Any new allergies or adverse reactions: No Accompanied By: self Had [name] fall or experienced change in No Transfer Assistance: None activities of daily living that may affect Patient Identification Verified: Yes risk of falls: Secondary Verification Process Completed: Yes Signs or symptoms of abuse/neglect since last visito No Patient Requires Transmission-Based Precautions: No Hospitalized since last visit: No Patient Has Alerts: Yes Implantable device outside of the clinic excluding No Patient Alerts: Patient on Blood Thinner cellular tissue based products placed in the center since last visit: Has Dressing in Place as Prescribed: Yes Pain Present Now: No Electronic Signature(s) Signed: 06/05/2023 12:54:25 PM By: [name] RN, BSN Entered By: [name] on 06/05/2023 11:12:29 -------------------------------------------------------------------------------- Encounter Discharge Information Details Patient Name: Date of Service: [name], [name] 06/05/2023 10:00 [name] M Medical Record Number: [id number] Patient Account Number: [id number] Date of Birth/Sex: Treating RN: 1951-10-05 (72 y.o. [name] Primary Care : [name] Other Clinician: Referring : Treating /Extender: [name] in Treatment: 13 Encounter Discharge Information Items Post Procedure Vitals Discharge Condition: Stable Temperature (F): 97.9 Ambulatory Status: Ambulatory Pulse (bpm): 76 Discharge Destination: Home Respiratory Rate (breaths/min): 18 Transportation: Private Auto Blood Pressure (mmHg): 218/96 Accompanied By: self Schedule Follow-up Appointment: Yes Clinical Summary of Care: Patient Declined Electronic Signature(s) Signed: 06/05/2023 12:54:25 PM By: [name] RN, BSN Entered By: [name] on 06/05/2023 12:52:04 [name] ([id number]) [id number] Page 2 of 7 -------------------------------------------------------------------------------- Lower Extremity Assessment Details Patient Name: Date of Service: [name] 06/05/2023 10:00 [name] M Medical Record Number: [id number] Patient Account Number: [id number] Date of Birth/Sex: Treating RN: 11/09/1950 (72 y.o. [name] Primary Care : [name] Other Clinician: Referring : Treating /Extender: [name] Rife Weeks in Treatment: 13 Edema Assessment Assessed: Kyra Searles: No] Franne Forts: No] Edema: [Left: N] [Right: o] Calf Left: Right: Point of Measurement: 33 cm From Medial Instep 34.5 cm Ankle Left: Right: Point of Measurement: 10 cm From Medial Instep 21.7 cm Vascular Assessment Pulses: Dorsalis Pedis Palpable: [Right:Yes] Extremity colors, hair growth, and conditions: Extremity Color: [Right:Normal] Hair Growth on Extremity: [Right:Yes] Temperature of Extremity: [Right:Warm < 3 seconds] Electronic Signature(s) Signed: 06/05/2023 12:54:25 PM By: [name] RN, BSN Entered By: [name] on 06/05/2023 11:13:25 -------------------------------------------------------------------------------- Multi Wound Chart Details Patient Name: Date of Service: [name], [name] 06/05/2023 10:00 [name] M Medical  Record Number: [id number] Patient Account Number: [id number] Date of Birth/Sex: Treating RN: 10-13-1951 (72 y.o. M) Primary Care : [name] Other Clinician: Referring : Treating /Extender: [name] Rife Weeks in Treatment: 13 Vital Signs Height(in): 67 Capillary Blood Glucose(mg/dl): 176 Weight(lbs): 160 Pulse(bpm): 76 Body Mass Index(BMI): 24.1 Blood Pressure(mmHg): 218/96 Temperature(F): 97.9 Respiratory Rate(breaths/min): 18 [8:Photos:] [N/[name]:N/[name]] Right T Great oe N/[name] N/[name] Wound Location: Gradually Appeared N/[name] N/[name] Wounding Event: Diabetic Wound/Ulcer of the Lower N/[name] N/[name] Primary Etiology: Extremity Hypertension, Type II Diabetes, N/[name] N/[name] Comorbid History: Osteoarthritis, Neuropathy, Received Radiation 02/04/2023 N/[name] N/[name] Date Acquired: 13 N/[name]  N/[name] Weeks of Treatment: Open N/[name] N/[name] Wound Status: No N/[name] N/[name] Wound Recurrence: 1.5x0.7x0.2 N/[name] N/[name] Measurements L x W x D (cm) 0.825 N/[name] N/[name] [name] (cm) : rea 0.165 N/[name] N/[name] Volume (cm) : 92.90% N/[name] N/[name] % Reduction in [name] rea: 92.90% N/[name] N/[name] % Reduction in Volume: Grade 3 N/[name] N/[name] Classification: Medium N/[name] N/[name] Exudate [name] mount: Serosanguineous N/[name] N/[name] Exudate Type: red, brown N/[name] N/[name] Exudate Color: Distinct, outline attached N/[name] N/[name] Wound Margin: Large (67-100%) N/[name] N/[name] Granulation [name] mount: Red, Pale N/[name] N/[name] Granulation Quality: None Present (0%) N/[name] N/[name] Necrotic [name] mount: Fat Layer (Subcutaneous Tissue): Yes N/[name] N/[name] Exposed Structures: Fascia: No Tendon: No Muscle: No Joint: No Bone: No Small (1-33%) N/[name] N/[name] Epithelialization: Debridement - Selective/Open Wound N/[name] N/[name] Debridement: Pre-procedure Verification/Time Out 11:20 N/[name] N/[name] Taken: Callus, Slough N/[name] N/[name] Tissue Debrided: Skin/Epidermis N/[name] N/[name] Level: 0.82 N/[name] N/[name] Debridement [name] (sq cm): rea Curette N/[name] N/[name] Instrument: Minimum N/[name] N/[name] Bleeding: Pressure N/[name] N/[name] Hemostasis [name] chieved: 0 N/[name]  N/[name] Procedural Pain: 0 N/[name] N/[name] Post Procedural Pain: Procedure was tolerated well N/[name] N/[name] Debridement Treatment Response: 1.5x0.7x0.1 N/[name] N/[name] Post Debridement Measurements L x W x D (cm) 0.082 N/[name] N/[name] Post Debridement Volume: (cm) Callus: Yes N/[name] N/[name] Periwound Skin Texture: Maceration: No N/[name] N/[name] Periwound Skin Moisture: Dry/Scaly: No No Abnormalities Noted N/[name] N/[name] Periwound Skin Color: No Abnormality N/[name] N/[name] Temperature: Cellular or Tissue Based Product N/[name] N/[name] Procedures Performed: Debridement Treatment Notes Electronic Signature(s) Signed: 06/05/2023 11:46:10 AM By: [name] MD FACS Entered By: [name] on 06/05/2023 11:46:09 -------------------------------------------------------------------------------- Multi-Disciplinary Care Plan Details Patient Name: Date of Service: [name], [name] 06/05/2023 10:00 [name] M Medical Record Number: [id number] Patient Account Number: [id number] Date of Birth/Sex: Treating RN: 02-08-1951 (72 y.o. [name] Primary Care : [name] Other Clinician: Referring : Treating /Extender: [name], [name] ([id number]) [id number] Page 4 of 7 Weeks in Treatment: 13 Multidisciplinary Care Plan reviewed with physician Active Inactive HBO Nursing Diagnoses: Anxiety related to knowledge deficit of hyperbaric oxygen therapy and treatment procedures Goals: Patient will tolerate the hyperbaric oxygen therapy treatment Date Initiated: 03/02/2023 Target Resolution Date: 06/26/2023 Goal Status: Active Interventions: Assess for signs and symptoms related to adverse events, including but not limited to confinement anxiety, pneumothorax, oxygen toxicity and baurotrauma Notes: Wound/Skin Impairment Nursing Diagnoses: Impaired tissue integrity Knowledge deficit related to ulceration/compromised skin integrity Goals: Patient/caregiver will verbalize  understanding of skin care regimen Date Initiated: 03/17/2023 Target Resolution Date: 06/26/2023 Goal Status: Active Ulcer/skin breakdown will have [name] volume reduction of 30% by week 4 Date Initiated: 03/17/2023 Date Inactivated: 04/02/2023 Target Resolution Date: 03/31/2023 Goal Status: Met Ulcer/skin breakdown will have [name] volume reduction of 50% by week 8 Date Initiated: 04/02/2023 Date Inactivated: 05/07/2023 Target Resolution Date: 04/30/2023 Goal Status: Met Interventions: Assess patient/caregiver ability to obtain necessary supplies Assess patient/caregiver ability to perform ulcer/skin care regimen upon admission and as needed Assess ulceration(s) every visit Treatment Activities: Skin care regimen initiated : 03/17/2023 Topical wound management initiated : 03/17/2023 Notes: Electronic Signature(s) Signed: 06/05/2023 12:54:25 PM By: [name] RN, BSN Entered By: [name] on 06/05/2023 11:17:38 -------------------------------------------------------------------------------- Pain Assessment Details Patient Name: Date of Service: [name], [name] 06/05/2023 10:00 [name] M Medical Record Number: [id number] Patient Account Number: [id number] Date of Birth/Sex: Treating RN: [birth year] (72 y.o. M) Primary Care : [name] Other Clinician: Referring : Treating /Extender: [name] Rife Weeks in Treatment: 13 Active Problems Location of Pain  Severity and Description of Pain Patient Has Paino No [name], [name] ([id number]) [id number] Page 5 of 7 Patient Has Paino No Site Locations Rate the pain. Current Pain Level: 0 Pain Management and Medication Current Pain Management: Electronic Signature(s) Signed: 06/05/2023 12:54:25 PM By: [name] RN, BSN Entered By: [name] on 06/05/2023 11:12:51 -------------------------------------------------------------------------------- Patient/Caregiver  Education Details Patient Name: Date of Service: [name], [name] 8/9/2024andnbsp10:00 [name] M Medical Record Number: [id number] Patient Account Number: [id number] Date of Birth/Gender: Treating RN: Dec 12, 1950 (72 y.o. [name] Primary Care Physician: [name] Other Clinician: Referring Physician: Treating Physician/Extender: [name] in Treatment: 13 Education Assessment Education Provided To: Patient Education Topics Provided Elevated Blood Sugar/ Impact on Healing: Methods: Explain/Verbal Responses: Reinforcements needed, State content correctly Offloading: Methods: Explain/Verbal Responses: Reinforcements needed, State content correctly Wound/Skin Impairment: Methods: Explain/Verbal Responses: Reinforcements needed, State content correctly Electronic Signature(s) Signed: 06/05/2023 12:54:25 PM By: [name] RN, BSN Entered By: [name] on 06/05/2023 11:18:17 Older, [name] ([id number]) [id number] Page 6 of 7 -------------------------------------------------------------------------------- Wound Assessment Details Patient Name: Date of Service: [name] 06/05/2023 10:00 [name] M Medical Record Number: [id number] Patient Account Number: [id number] Date of Birth/Sex: Treating RN: [birth year] (72 y.o. M) Primary Care : [name] Other Clinician: Referring : Treating /Extender: [name] Rife Weeks in Treatment: 13 Wound Status Wound Number: 8 Primary Diabetic Wound/Ulcer of the Lower Extremity Etiology: Wound Location: Right T Great oe Wound Open Wounding Event: Gradually Appeared Status: Date Acquired: 02/04/2023 Comorbid Hypertension, Type II Diabetes, Osteoarthritis, Neuropathy, Weeks Of Treatment: 13 History: Received Radiation Clustered Wound: No Photos Wound Measurements Length: (cm) 1 Width: (cm) 0 Depth: (cm) 0 Area:  (cm) Volume: (cm) .5 % Reduction in Area: 92.9% .7 % Reduction in Volume: 92.9% .2 Epithelialization: Small (1-33%) 0.825 Tunneling: No 0.165 Undermining: No Wound Description Classification: Grade 3 Wound Margin: Distinct, outline attached Exudate Amount: Medium Exudate Type: Serosanguineous Exudate Color: red, brown Foul Odor After Cleansing: No Slough/Fibrino Yes Wound Bed Granulation Amount: Large (67-100%) Exposed Structure Granulation Quality: Red, Pale Fascia Exposed: No Necrotic Amount: None Present (0%) Fat Layer (Subcutaneous Tissue) Exposed: Yes Tendon Exposed: No Muscle Exposed: No Joint Exposed: No Bone Exposed: No Periwound Skin Texture Texture Color No Abnormalities Noted: No No Abnormalities Noted: Yes Callus: Yes Temperature / Pain Temperature: No Abnormality Moisture No Abnormalities Noted: No Dry / Scaly: No Maceration: No Treatment Notes Wound #8 (Toe Great) Wound Laterality: Right [name], [name] ([id number]) [id number] Page 7 of 7 Cleanser Normal Saline Discharge Instruction: Cleanse the wound with Normal Saline prior to applying [name] clean dressing using gauze sponges, not tissue or cotton balls. Soap and Water Discharge Instruction: May shower and wash wound with dial antibacterial soap and water prior to dressing change. Peri-Wound Care Topical Primary Dressing Epicord Secondary Dressing ADAPTIC TOUCH 3x4.25 in Discharge Instruction: Apply over primary dressing as directed. Woven Gauze Sponges 2x2 in Discharge Instruction: Apply over primary dressing as directed. Secured With Conforming Stretch Gauze Bandage, Sterile 2x75 (in/in) Discharge Instruction: Secure with stretch gauze as directed. Compression Wrap Compression Stockings Add-Ons Electronic Signature(s) Signed: 06/05/2023 12:54:25 PM By: [name] RN, BSN Entered By: [name] on 06/05/2023  11:17:09 -------------------------------------------------------------------------------- Vitals Details Patient Name: Date of Service: [name], [name] 06/05/2023 10:00 [name] M Medical Record Number: [id number] Patient Account Number: [id number] Date of Birth/Sex: Treating RN: 1951-10-03 (72 y.o. M) Primary Care : [name] Other Clinician: Referring : Treating /Extender: [name] Rife Weeks in Treatment:  13 Vital Signs Time Taken: 10:54 Temperature (F): 97.9 Height (in): 67 Pulse (bpm): 76 Weight (lbs): 154 Respiratory Rate (breaths/min): 18 Body Mass Index (BMI): 24.1 Blood Pressure (mmHg): 218/96 Capillary Blood Glucose (mg/dl): 161 Reference Range: 80 - 120 mg / dl Electronic Signature(s) Signed: 06/05/2023 12:54:25 PM By: [name] RN, BSN Entered By: [name] on 06/05/2023 11:12:37

## 2023-06-05 NOTE — Progress Notes (Addendum)
VARUN, FOWLKS ([id number]) [id number] Page 1 of 2 Visit Report for 06/05/2023 Arrival Information Details Patient Name: Date of Service: [name] 06/05/2023 7:30 A M Medical Record Number: [id number] Patient Account Number: [id number] Date of Birth/Sex: Treating RN: 02-Jul-1951 (72 y.o. [name], [name] Primary Care : [name] Other Clinician: [name] Referring : Treating /Extender: [name] in Treatment: 13 Visit Information History Since Last Visit All ordered tests and consults were completed: Yes Patient Arrived: Ambulatory Added or deleted any medications: No Arrival Time: 07:40 Any new allergies or adverse reactions: No Accompanied By: None Had a fall or experienced change in No Transfer Assistance: None activities of daily living that may affect Patient Identification Verified: Yes risk of falls: Secondary Verification Process Completed: Yes Signs or symptoms of abuse/neglect since last visito No Patient Requires Transmission-Based Precautions: No Hospitalized since last visit: No Patient Has Alerts: Yes Implantable device outside of the clinic excluding No Patient Alerts: Patient on Blood Thinner cellular tissue based products placed in the center since last visit: Pain Present Now: No Electronic Signature(s) Signed: 06/05/2023 9:27:58 AM By: [name] EMT Entered By: [name] on 06/05/2023 09:27:57 -------------------------------------------------------------------------------- Encounter Discharge Information Details Patient Name: Date of Service: [name] 06/05/2023 7:30 A M Medical Record Number: [id number] Patient Account Number: [id number] Date of Birth/Sex: Treating RN: 21-May-1951 (72 y.o. [name], [name] Primary Care : [name] Other Clinician: [name] Referring : Treating /Extender: [name] in Treatment: 13 Encounter Discharge Information Items Discharge Condition: Stable Ambulatory Status: Ambulatory Discharge Destination: Home Transportation: Private Auto Accompanied By: None Schedule Follow-up Appointment: Yes Clinical Summary of Care: Electronic Signature(s) Signed: 06/05/2023 10:46:50 AM By: [name] EMT Entered By: [name] on 06/05/2023 10:46:50 [name], [name] ([id number]) [id number] Page 2 of 2 -------------------------------------------------------------------------------- Vitals Details Patient Name: Date of Service: [name] 06/05/2023 7:30 A M Medical Record Number: [id number] Patient Account Number: [id number] Date of Birth/Sex: Treating RN: 07-Oct-1951 (72 y.o. [name], [name] Primary Care : [name] Other Clinician: [name] Referring : Treating /Extender: [name]: 13 Vital Signs Time Taken: 07:43 Temperature (F): 97.0 Height (in): 67 Pulse (bpm): 76 Weight (lbs): 154 Respiratory Rate (breaths/min): 18 Body Mass Index (BMI): 24.1 Blood Pressure (mmHg): 143/91 Capillary Blood Glucose (mg/dl): 623 Reference Range: 80 - 120 mg / dl Electronic Signature(s) Signed: 06/05/2023 9:28:34 AM By: [name] EMT Entered By: [name] on 06/05/2023 76:28:31

## 2023-06-08 ENCOUNTER — Encounter (HOSPITAL_BASED_OUTPATIENT_CLINIC_OR_DEPARTMENT_OTHER): Payer: Medicare HMO | Admitting: Internal Medicine

## 2023-06-08 DIAGNOSIS — M86171 Other acute osteomyelitis, right ankle and foot: Secondary | ICD-10-CM | POA: Diagnosis not present

## 2023-06-08 DIAGNOSIS — E11319 Type 2 diabetes mellitus with unspecified diabetic retinopathy without macular edema: Secondary | ICD-10-CM | POA: Diagnosis not present

## 2023-06-08 DIAGNOSIS — L97514 Non-pressure chronic ulcer of other part of right foot with necrosis of bone: Secondary | ICD-10-CM | POA: Diagnosis not present

## 2023-06-08 DIAGNOSIS — I129 Hypertensive chronic kidney disease with stage 1 through stage 4 chronic kidney disease, or unspecified chronic kidney disease: Secondary | ICD-10-CM | POA: Diagnosis not present

## 2023-06-08 DIAGNOSIS — E11621 Type 2 diabetes mellitus with foot ulcer: Secondary | ICD-10-CM | POA: Diagnosis not present

## 2023-06-08 DIAGNOSIS — N1832 Chronic kidney disease, stage 3b: Secondary | ICD-10-CM | POA: Diagnosis not present

## 2023-06-08 DIAGNOSIS — Z8546 Personal history of malignant neoplasm of prostate: Secondary | ICD-10-CM | POA: Diagnosis not present

## 2023-06-08 DIAGNOSIS — M1990 Unspecified osteoarthritis, unspecified site: Secondary | ICD-10-CM | POA: Diagnosis not present

## 2023-06-08 DIAGNOSIS — E1122 Type 2 diabetes mellitus with diabetic chronic kidney disease: Secondary | ICD-10-CM | POA: Diagnosis not present

## 2023-06-08 DIAGNOSIS — Z833 Family history of diabetes mellitus: Secondary | ICD-10-CM | POA: Diagnosis not present

## 2023-06-08 LAB — GLUCOSE, CAPILLARY
Glucose-Capillary: 209 mg/dL — ABNORMAL HIGH (ref 70–99)
Glucose-Capillary: 214 mg/dL — ABNORMAL HIGH (ref 70–99)

## 2023-06-08 NOTE — Progress Notes (Signed)
[place], [name] ([id number]) [id number] Page 1 of 2 Visit Report for 06/08/2023 Arrival Information Details Patient Name: Date of Service: [name] 06/08/2023 7:30 A M Medical Record Number: [id number] Patient Account Number: [id number] Date of Birth/Sex: Treating RN: Jul 29, 1951 (71 y.o. [name] Primary Care : [name] Other Clinician: Referring : Treating /Extender: Geoffry Paradise in Treatment: 14 Visit Information History Since Last Visit Pain Present Now: No Patient Arrived: Ambulatory Arrival Time: 08:00 Accompanied By: self Transfer Assistance: None Patient Requires Transmission-Based Precautions: No Patient Has Alerts: Yes Patient Alerts: Patient on Blood Thinner Electronic Signature(s) Signed: 06/08/2023 3:41:29 PM By: [name] RN, BSN Entered By: [name] on 06/08/2023 11:16:38 -------------------------------------------------------------------------------- Encounter Discharge Information Details Patient Name: Date of Service: [name], [name] 06/08/2023 7:30 A M Medical Record Number: [id number] Patient Account Number: [id number] Date of Birth/Sex: Treating RN: [birth year] (72 y.o. [name] Primary Care : [name] Other Clinician: Referring : Treating /Extender: Geoffry Paradise in Treatment: 14 Encounter Discharge Information Items Discharge Condition: Stable Ambulatory Status: Ambulatory Discharge Destination: Home Transportation: Private Auto Accompanied By: self Schedule Follow-up Appointment: Yes Clinical Summary of Care: Patient Declined Electronic Signature(s) Signed: 06/08/2023 3:41:29 PM By: [name] RN, BSN Entered By: [name] on 06/08/2023 11:26:08 -------------------------------------------------------------------------------- Patient/Caregiver Education  Details Patient Name: Date of Service: [name], [name] 8/12/2024andnbsp7:30 [name], [name] ([id number]) [id number] Page 2 of 2 Medical Record Number: [id number] Patient Account Number: [id number] Date of Birth/Gender: Treating RN: 1951/08/21 (72 y.o. [name] Primary Care Physician: [name] Other Clinician: Referring Physician: Treating Physician/Extender: Geoffry Paradise in Treatment: 14 Education Assessment Education Provided To: Patient Education Topics Provided Elevated Blood Sugar/ Impact on Healing: Methods: Explain/Verbal Responses: Reinforcements needed, State content correctly Hyperbaric Oxygenation: Methods: Explain/Verbal Responses: Reinforcements needed, State content correctly Electronic Signature(s) Signed: 06/08/2023 3:41:29 PM By: [name] RN, BSN Entered By: [name] on 06/08/2023 11:25:51 -------------------------------------------------------------------------------- Vitals Details Patient Name: Date of Service: [name], [name] 06/08/2023 7:30 A M Medical Record Number: [id number] Patient Account Number: [id number] Date of Birth/Sex: Treating RN: 07-29-1951 (73 y.o. [name] Primary Care : [name] Other Clinician: Referring : Treating /Extender: Lily Lovings Weeks in Treatment: 14 Vital Signs Time Taken: 08:15 Temperature (F): 97.3 Height (in): 67 Pulse (bpm): 76 Weight (lbs): 154 Respiratory Rate (breaths/min): 18 Body Mass Index (BMI): 24.1 Blood Pressure (mmHg): 160/82 Capillary Blood Glucose (mg/dl): 182 Reference Range: 80 - 120 mg / dl Electronic Signature(s) Signed: 06/08/2023 3:41:29 PM By: [name] RN, BSN Entered By: [name] on 06/08/2023 11:17:25

## 2023-06-08 NOTE — Progress Notes (Signed)
SHALON, SEABERRY ([id number]) [id number] Page 1 of 2 Visit Report for 06/08/2023 Problem List Details Patient Name: Date of Service: [name] 06/08/2023 7:30 A M Medical Record Number: [id number] Patient Account Number: [id number] Date of Birth/Sex: Treating RN: 12-01-1950 (72 y.o. 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 M86.171 Other acute osteomyelitis, right ankle and foot 03/02/2023 No Yes E11.621 Type 2 diabetes mellitus with foot ulcer 03/02/2023 No Yes I10 Essential (primary) hypertension 03/02/2023 No Yes N18.32 Chronic kidney disease, stage 3b 03/02/2023 No Yes Inactive Problems Resolved Problems ICD-10 Code Description Active Date Resolved Date L97.512 Non-pressure chronic ulcer of other part of right foot with fat layer 03/09/2023 03/09/2023 exposed Electronic Signature(s) Signed: 06/08/2023 3:41:29 PM By: [name] RN, BSN Signed: 06/08/2023 4:52:09 PM By: Geralyn Corwin DO Entered By: [name] on 06/08/2023 12:15:27 -------------------------------------------------------------------------------- SuperBill Details Patient Name: Date of Service: [name], [name] 06/08/2023 Medical Record Number: [id number] Patient Account Number: [id number] Date of Birth/Sex: Treating RN: 09-27-1951 (72 y.o. [name] [place], [place] ([id number]) [phone].pdf Page 2 of 2 Primary Care Provider: [name] Other Clinician: Referring Provider: Treating Provider/Extender: Geoffry Paradise in Treatment: 14 Diagnosis Coding ICD-10 Codes Code Description [phone] Non-pressure  chronic ulcer of other part of right foot with necrosis of bone M86.171 Other acute osteomyelitis, right ankle and foot E11.621 Type 2 diabetes mellitus with foot ulcer I10 Essential (primary) hypertension N18.32 Chronic kidney disease, stage 3b Facility Procedures : CPT4 Code Description: [id number] G0277-(Facility Use Only) HBOT full body chamber, , ICD-10 Diagnosis Description E11.621 Type 2 diabetes mellitus with foot ulcer L97.514 Non-pressure chronic ulcer of other part of right foot wi M86.171 Other acute  osteomyelitis, right ankle and foot Modifier: th necrosis o Quantity: 4 f bone Physician Procedures : CPT4 Code Description Modifier [id number] - WC PHYS HYPERBARIC OXYGEN THERAPY ICD-10 Diagnosis Description E11.621 Type 2 diabetes mellitus with foot ulcer L97.514 Non-pressure chronic ulcer of other part of right foot with necrosis o M86.171 Other  acute osteomyelitis, right ankle and foot Quantity: 1 f bone Electronic Signature(s) Signed: 06/08/2023 3:41:29 PM By: [name] RN, BSN Signed: 06/08/2023 4:52:09 PM By: Geralyn Corwin DO Entered By: [name] on 06/08/2023 12:15:20

## 2023-06-08 NOTE — Progress Notes (Signed)
[place], [name] ([id number]) [id number] Page 1 of 2 Visit Report for 06/08/2023 HBO Details Patient Name: Date of Service: [name] 06/08/2023 7:30 A M Medical Record Number: [id number] Patient Account Number: [id number] Date of Birth/Sex: Treating RN: 06/05/1951 (72 y.o. [name] Primary Care : [name] Other Clinician: Referring : Treating /Extender: Lily Lovings Weeks in Treatment: 14 HBO Treatment Course Details Treatment Course Number: 1 Ordering : [name] T Treatments Ordered: otal 40 HBO Treatment Start Date: 04/06/2023 HBO Indication: Diabetic Ulcer(s) of the Lower Extremity Standard/Conservative Wound Care tried and failed greater than or equal to 30 days Wound #8 Right T Great oe HBO Treatment Details Treatment Number: 33 Patient Type: Outpatient Chamber Type: Monoplace Chamber Serial #: [id number] Treatment Protocol: 2.5 ATA with 90 minutes oxygen, with two 5 minute air breaks Treatment Details Compression Rate Down: 2.0 psi / minute De-Compression Rate Up: 2.0 psi / minute A breaks and breathing ir Compress Tx Pressure periods Decompress Decompress Begins Reached (leave unused spaces Begins Ends blank) Chamber Pressure (ATA 1 2.5 2.5 2.5 2.5 2.5 - - 2.5 1 ) Clock Time (24 hr) 08:34 08:51 09:21 09:29 09:55 10:00 - - 10:30 10:42 Treatment Length: 128 (minutes) Treatment Segments: 4 Vital Signs Capillary Blood Glucose Reference Range: 80 - 120 mg / dl HBO Diabetic Blood Glucose Intervention Range: <131 mg/dl or >324 mg/dl Time Vitals Blood Respiratory Capillary Blood Glucose Pulse Action Type: Pulse: Temperature: Taken: Pressure: Rate: Glucose (mg/dl): Meter #: Oximetry (%) Taken: Pre 08:15 160/82 76 18 97.3 209 Post 10:50 201/101 80 18 97.3 214 Treatment Response Treatment Toleration: Well Treatment Completion Status: Treatment Completed without  Adverse Event Additional Procedure Documentation Tissue Sevierity: Necrosis of bone Physician HBO Attestation: I certify that I supervised this HBO treatment in accordance with Medicare guidelines. A trained emergency response team is readily available per Yes hospital policies and procedures. Continue HBOT as ordered. Yes Electronic Signature(s) Signed: 06/08/2023 4:52:09 PM By: Geralyn Corwin DO Previous Signature: 06/08/2023 3:41:29 PM Version By: [name] RN, BSN Entered By: Geralyn Corwin on 06/08/2023 16:49:38 [place], [name] ([id number]) [id number] Page 2 of 2 -------------------------------------------------------------------------------- HBO Safety Checklist Details Patient Name: Date of Service: [name] 06/08/2023 7:30 A M Medical Record Number: [id number] Patient Account Number: [id number] Date of Birth/Sex: Treating RN: June 22, 1951 (72 y.o. [name] Primary Care : [name] Other Clinician: Referring : Treating /Extender: Lily Lovings Weeks in Treatment: 14 HBO Safety Checklist Items Safety Checklist Consent Form Signed Patient voided / foley secured and emptied When did you last eato 0700 Last dose of injectable or oral agent 8/11 at 1900 Ostomy pouch emptied and vented if applicable NA All implantable devices assessed, documented and approved NA Intravenous access site secured and place NA Valuables secured Linens and cotton and cotton/polyester blend (less than 51% polyester) Personal oil-based products / skin lotions / body lotions removed Wigs or hairpieces removed Smoking or tobacco materials removed Books / newspapers / magazines / loose paper removed Cologne, aftershave, perfume and deodorant removed Jewelry removed (may wrap wedding band) Make-up removed Hair care products removed Battery operated devices (external) removed Heating patches and chemical  warmers removed Titanium eyewear removed Nail polish cured greater than 10 hours Casting material cured greater than 10 hours NA Hearing aids removed Loose dentures or partials removed Prosthetics have been removed Patient demonstrates correct use of air break device (if applicable) Patient concerns have been addressed Patient grounding bracelet on and cord attached  to chamber Specifics for Inpatients (complete in addition to above) Medication sheet sent with patient NA Intravenous medications needed or due during therapy sent with patient NA Drainage tubes (e.g. nasogastric tube or chest tube secured and vented) NA Endotracheal or Tracheotomy tube secured NA Cuff deflated of air and inflated with saline NA Airway suctioned NA Electronic Signature(s) Signed: 06/08/2023 3:41:29 PM By: [name] RN, BSN Entered By: [name] on 06/08/2023 11:19:37

## 2023-06-09 ENCOUNTER — Encounter (HOSPITAL_BASED_OUTPATIENT_CLINIC_OR_DEPARTMENT_OTHER): Payer: Medicare HMO | Admitting: Internal Medicine

## 2023-06-09 DIAGNOSIS — L97514 Non-pressure chronic ulcer of other part of right foot with necrosis of bone: Secondary | ICD-10-CM | POA: Diagnosis not present

## 2023-06-09 DIAGNOSIS — M1990 Unspecified osteoarthritis, unspecified site: Secondary | ICD-10-CM | POA: Diagnosis not present

## 2023-06-09 DIAGNOSIS — Z8546 Personal history of malignant neoplasm of prostate: Secondary | ICD-10-CM | POA: Diagnosis not present

## 2023-06-09 DIAGNOSIS — E1122 Type 2 diabetes mellitus with diabetic chronic kidney disease: Secondary | ICD-10-CM | POA: Diagnosis not present

## 2023-06-09 DIAGNOSIS — M86171 Other acute osteomyelitis, right ankle and foot: Secondary | ICD-10-CM | POA: Diagnosis not present

## 2023-06-09 DIAGNOSIS — I129 Hypertensive chronic kidney disease with stage 1 through stage 4 chronic kidney disease, or unspecified chronic kidney disease: Secondary | ICD-10-CM | POA: Diagnosis not present

## 2023-06-09 DIAGNOSIS — E11621 Type 2 diabetes mellitus with foot ulcer: Secondary | ICD-10-CM | POA: Diagnosis not present

## 2023-06-09 DIAGNOSIS — N1832 Chronic kidney disease, stage 3b: Secondary | ICD-10-CM | POA: Diagnosis not present

## 2023-06-09 DIAGNOSIS — Z833 Family history of diabetes mellitus: Secondary | ICD-10-CM | POA: Diagnosis not present

## 2023-06-09 DIAGNOSIS — E11319 Type 2 diabetes mellitus with unspecified diabetic retinopathy without macular edema: Secondary | ICD-10-CM | POA: Diagnosis not present

## 2023-06-09 LAB — GLUCOSE, CAPILLARY
Glucose-Capillary: 216 mg/dL — ABNORMAL HIGH (ref 70–99)
Glucose-Capillary: 225 mg/dL — ABNORMAL HIGH (ref 70–99)

## 2023-06-09 NOTE — Progress Notes (Addendum)
[place], [name] ([id number]) [id number] Page 1 of 3 Visit Report for 06/09/2023 HBO Details Patient Name: Date of Service: [name] 06/09/2023 7:30 [name] M Medical Record Number: [id number] Patient Account Number: [id number] Date of Birth/Sex: Treating RN: 08-07-1951 (72 y.o. [name], [name] Primary Care [name]: [name] Other Clinician: Karl [name] Referring [name]: Treating [name]/Extender: [name] [name] in Treatment: 14 HBO Treatment Course Details Treatment Course Number: 1 Ordering [name]: [name] T Treatments Ordered: otal 40 HBO Treatment Start Date: 04/06/2023 HBO Indication: Diabetic Ulcer(s) of the Lower Extremity Standard/Conservative Wound Care tried and failed greater than or equal to 30 days Wound #8 Right T Great oe HBO Treatment Details Treatment Number: 34 Patient Type: Outpatient Chamber Type: Monoplace Chamber Serial #: [id number] Treatment Protocol: 2.5 ATA with 90 minutes oxygen, with two 5 minute air breaks Treatment Details Compression Rate Down: 2.0 psi / minute De-Compression Rate Up: 2.5 psi / minute [name] breaks and ir Compress Tx Pressure breathing periods Decompress Decompress Begins Reached (leave unused spaces Begins Ends blank) Chamber Pressure (ATA 1 2.5 2.5 2.5 2.5 2.5 - - 2.5 1 ) Clock Time (24 hr) 08:22 08:36 09:06 09:11 09:41 - - - 09:42 09:51 Treatment Length: 89 (minutes) Treatment Segments: 3 Vital Signs Capillary Blood Glucose Reference Range: 80 - 120 mg / dl HBO Diabetic Blood Glucose Intervention Range: <131 mg/dl or >875 mg/dl Time Vitals Blood Respiratory Capillary Blood Glucose Pulse Action Type: Pulse: Temperature: Taken: Pressure: Rate: Glucose (mg/dl): Meter #: Oximetry (%) Taken: Pre 07:56 188/93 79 18 97.2 216 Post 09:56 208/106 80 18 98.4 225 Treatment Response Treatment Toleration: Fair Adverse Events: 1:Other - Headache Treatment  Completion Status: Treatment Completed with Adverse Event Treatment Notes The patient was giving 8 oz Glucerna before per orders. At 0941 at the start of the patient 2nd air brake, He stated that he had [name] headache. I called Dr. Mikey Bussing to come to chamber side to speak with the patient and started traveling the patient out of the chamber. Travel was set at 2.5 PSI/min. Post treatment blood pressure was 208/106. Dr. Mikey Bussing was informed. Dr. Mikey Bussing came back to speak with the patient. She advised the patient to go to Emergency Room and be seen by them. The patient was discharged. Additional Procedure Documentation Tissue Sevierity: Necrosis of bone [name] Notes Was notified patient was having [name] headache in the chamber. Came to evaluate patient. He denied any vision changes, nausea/vomiting, chest pain or shortness of breath, numbness. The dive was stopped and he was removed from the chamber. Vitals showed [name] blood pressure of 208/106. He recently had an elevated blood pressure reading [name] few days ago however this is not the usual trend for the patient. I recommended he go to the emergency room to be evaluated. He expressed understanding. He had no neurological deficits on exam. Physician HBO AttestationNormand [name], [name] ([id number]) [id number] Page 2 of 3 I certify that I supervised this HBO treatment in accordance with Medicare guidelines. [name] trained emergency response team is readily available per Yes hospital policies and procedures. Continue HBOT as ordered. Yes Electronic Signature(s) Signed: 06/11/2023 4:25:59 PM By: [name] Corwin DO Previous Signature: 06/09/2023 4:37:08 PM Version By: [name] Corwin DO Previous Signature: 06/09/2023 2:24:54 PM Version By: [name] EMT Entered By: [name] on 06/11/2023 16:17:46 -------------------------------------------------------------------------------- HBO Safety Checklist Details Patient Name: Date of  Service: [name] [name], [name] 06/09/2023 7:30 [name] M Medical Record Number: [id number] Patient Account Number: [id number] Date of Birth/Sex: Treating  RN: 02/10/1951 (72 y.o. [name], [name] Primary Care [name]: [name] Other Clinician: Karl [name] Referring [name]: Treating [name]/Extender: [name] [name] in Treatment: 14 HBO Safety Checklist Items Safety Checklist Consent Form Signed Patient voided / foley secured and emptied When did you last eato 0700 Last dose of injectable or oral agent Last night 22 units Novalog Ostomy pouch emptied and vented if applicable NA All implantable devices assessed, documented and approved NA Intravenous access site secured and place NA Valuables secured Linens and cotton and cotton/polyester blend (less than 51% polyester) Personal oil-based products / skin lotions / body lotions removed Wigs or hairpieces removed NA Smoking or tobacco materials removed Books / newspapers / magazines / loose paper removed Cologne, aftershave, perfume and deodorant removed Jewelry removed (may wrap wedding band) Make-up removed NA Hair care products removed Battery operated devices (external) removed Heating patches and chemical warmers removed Titanium eyewear removed NA Nail polish cured greater than 10 hours NA Casting material cured greater than 10 hours NA Hearing aids removed NA Loose dentures or partials removed removed by patient Prosthetics have been removed NA Patient demonstrates correct use of air break device (if applicable) Patient concerns have been addressed Patient grounding bracelet on and cord attached to chamber Specifics for Inpatients (complete in addition to above) Medication sheet sent with patient NA Intravenous medications needed or due during therapy sent with patient NA Drainage tubes (e.g. nasogastric tube or chest tube secured and vented) NA Endotracheal or Tracheotomy tube  secured NA Cuff deflated of air and inflated with saline NA Airway suctioned NA Notes The safety checklist was done before the treatment was started. [place], [name] ([id number]) [id number] Page 3 of 3 Electronic Signature(s) Signed: 06/09/2023 1:57:13 PM By: [name] EMT Entered By: [name] on 06/09/2023 13:57:12

## 2023-06-09 NOTE — Progress Notes (Signed)
AMOGH, BEAUMAN ([id number]) [id number] Page 1 of 2 Visit Report for 06/09/2023 Problem List Details Patient Name: Date of Service: [name] 06/09/2023 7:30 A M Medical Record Number: [id number] Patient Account Number: [id number] Date of Birth/Sex: Treating RN: 27-Apr-1951 (72 y.o. 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 M86.171 Other acute osteomyelitis, right ankle and foot 03/02/2023 No Yes E11.621 Type 2 diabetes mellitus with foot ulcer 03/02/2023 No Yes I10 Essential (primary) hypertension 03/02/2023 No Yes N18.32 Chronic kidney disease, stage 3b 03/02/2023 No Yes Inactive Problems Resolved Problems ICD-10 Code Description Active Date Resolved Date L97.512 Non-pressure chronic ulcer of other part of right foot with fat layer 03/09/2023 03/09/2023 exposed Electronic Signature(s) Signed: 06/09/2023 2:25:40 PM By: [name] EMT Signed: 06/09/2023 4:37:08 PM By: Geralyn Corwin DO Entered By: [name] on 06/09/2023 14:25:40 -------------------------------------------------------------------------------- SuperBill Details Patient Name: Date of Service: [name], [name] 06/09/2023 Medical Record Number: [id number] Patient Account Number: [id number] Date of Birth/Sex: Treating RN: 06-29-1951 (72 y.o. [name] [place], [place] ([id number]) [id number] Page 2 of 2 Primary Care Provider: [name] Other Clinician: [name] Referring Provider: Treating Provider/Extender: Lily Lovings Weeks in Treatment: 14 Diagnosis Coding ICD-10 Codes Code  Description [phone] Non-pressure chronic ulcer of other part of right foot with necrosis of bone M86.171 Other acute osteomyelitis, right ankle and foot E11.621 Type 2 diabetes mellitus with foot ulcer I10 Essential (primary) hypertension N18.32 Chronic kidney disease, stage 3b Facility Procedures : CPT4 Code Description: [id number] G0277-(Facility Use Only) HBOT full body chamber, , ICD-10 Diagnosis Description E11.621 Type 2 diabetes mellitus with foot ulcer L97.514 Non-pressure chronic ulcer of other part of right foot wi M86.171 Other acute  osteomyelitis, right ankle and foot Modifier: th necrosis o Quantity: 3 f bone Physician Procedures : CPT4 Code Description Modifier [id number] 99183 - WC PHYS HYPERBARIC OXYGEN THERAPY ICD-10 Diagnosis Description E11.621 Type 2 diabetes mellitus with foot ulcer L97.514 Non-pressure chronic ulcer of other part of right foot with necrosis o M86.171 Other  acute osteomyelitis, right ankle and foot Quantity: 1 f bone Electronic Signature(s) Signed: 06/09/2023 2:25:31 PM By: [name] EMT Signed: 06/09/2023 4:37:08 PM By: Geralyn Corwin DO Entered By: [name] on 06/09/2023 14:25:29

## 2023-06-09 NOTE — Progress Notes (Signed)
[place], [name] ([id number]) [id number] Page 1 of 2 Visit Report for 06/09/2023 Arrival Information Details Patient Name: Date of Service: [name] 06/09/2023 7:30 A M Medical Record Number: [id number] Patient Account Number: [id number] Date of Birth/Sex: Treating RN: 06-29-1951 (72 y.o. [name], [name] Primary Care : [name] Other Clinician: [name] Referring : Treating /Extender: Geoffry Paradise in Treatment: 14 Visit Information History Since Last Visit All ordered tests and consults were completed: Yes Patient Arrived: Ambulatory Added or deleted any medications: No Arrival Time: 07:53 Any new allergies or adverse reactions: No Accompanied By: None Had a fall or experienced change in No Transfer Assistance: None activities of daily living that may affect Patient Identification Verified: Yes risk of falls: Secondary Verification Process Completed: Yes Signs or symptoms of abuse/neglect since last visito No Patient Requires Transmission-Based Precautions: No Hospitalized since last visit: No Patient Has Alerts: Yes Implantable device outside of the clinic excluding No Patient Alerts: Patient on Blood Thinner cellular tissue based products placed in the center since last visit: Pain Present Now: No Electronic Signature(s) Signed: 06/09/2023 1:50:49 PM By: [name] EMT Entered By: [name] on 06/09/2023 13:50:49 -------------------------------------------------------------------------------- Encounter Discharge Information Details Patient Name: Date of Service: [name] RD, [name] 06/09/2023 7:30 A M Medical Record Number: [id number] Patient Account Number: [id number] Date of Birth/Sex: Treating RN: [birth year] (72 y.o. [name], [name] Primary Care : [name] Other Clinician: [name] Referring : Treating /Extender: Geoffry Paradise in Treatment: 14 Encounter Discharge Information Items Discharge Condition: Stable Ambulatory Status: Ambulatory Discharge Destination: Home Transportation: Private Auto Accompanied By: None Schedule Follow-up Appointment: Yes Clinical Summary of Care: Electronic Signature(s) Signed: 06/09/2023 2:26:51 PM By: [name] EMT Entered By: [name] on 06/09/2023 14:26:51 [place], [name] ([id number]) [id number] Page 2 of 2 -------------------------------------------------------------------------------- Vitals Details Patient Name: Date of Service: [name] 06/09/2023 7:30 A M Medical Record Number: [id number] Patient Account Number: [id number] Date of Birth/Sex: Treating RN: [birth year] (72 y.o. [name], [name] Primary Care : [name] Other Clinician: [name] Referring : Treating /Extender: Lily Lovings Weeks in Treatment: 14 Vital Signs Time Taken: 07:56 Temperature (F): 97.2 Height (in): 67 Pulse (bpm): 79 Weight (lbs): 154 Respiratory Rate (breaths/min): 18 Body Mass Index (BMI): 24.1 Blood Pressure (mmHg): 188/93 Capillary Blood Glucose (mg/dl): 967 Reference Range: 80 - 120 mg / dl Electronic Signature(s) Signed: 06/09/2023 1:52:02 PM By: [name] EMT Entered By: [name] on 06/09/2023 13:52:01

## 2023-06-10 ENCOUNTER — Encounter (HOSPITAL_BASED_OUTPATIENT_CLINIC_OR_DEPARTMENT_OTHER): Payer: Medicare HMO | Admitting: General Surgery

## 2023-06-11 ENCOUNTER — Encounter (HOSPITAL_BASED_OUTPATIENT_CLINIC_OR_DEPARTMENT_OTHER): Payer: Medicare HMO | Admitting: General Surgery

## 2023-06-12 ENCOUNTER — Encounter (HOSPITAL_BASED_OUTPATIENT_CLINIC_OR_DEPARTMENT_OTHER): Payer: Medicare HMO | Admitting: General Surgery

## 2023-06-12 DIAGNOSIS — Z833 Family history of diabetes mellitus: Secondary | ICD-10-CM | POA: Diagnosis not present

## 2023-06-12 DIAGNOSIS — E11319 Type 2 diabetes mellitus with unspecified diabetic retinopathy without macular edema: Secondary | ICD-10-CM | POA: Diagnosis not present

## 2023-06-12 DIAGNOSIS — E1122 Type 2 diabetes mellitus with diabetic chronic kidney disease: Secondary | ICD-10-CM | POA: Diagnosis not present

## 2023-06-12 DIAGNOSIS — N1832 Chronic kidney disease, stage 3b: Secondary | ICD-10-CM | POA: Diagnosis not present

## 2023-06-12 DIAGNOSIS — M86171 Other acute osteomyelitis, right ankle and foot: Secondary | ICD-10-CM | POA: Diagnosis not present

## 2023-06-12 DIAGNOSIS — I129 Hypertensive chronic kidney disease with stage 1 through stage 4 chronic kidney disease, or unspecified chronic kidney disease: Secondary | ICD-10-CM | POA: Diagnosis not present

## 2023-06-12 DIAGNOSIS — M1990 Unspecified osteoarthritis, unspecified site: Secondary | ICD-10-CM | POA: Diagnosis not present

## 2023-06-12 DIAGNOSIS — E11621 Type 2 diabetes mellitus with foot ulcer: Secondary | ICD-10-CM | POA: Diagnosis not present

## 2023-06-12 DIAGNOSIS — Z8546 Personal history of malignant neoplasm of prostate: Secondary | ICD-10-CM | POA: Diagnosis not present

## 2023-06-12 DIAGNOSIS — L97512 Non-pressure chronic ulcer of other part of right foot with fat layer exposed: Secondary | ICD-10-CM | POA: Diagnosis not present

## 2023-06-12 DIAGNOSIS — L97514 Non-pressure chronic ulcer of other part of right foot with necrosis of bone: Secondary | ICD-10-CM | POA: Diagnosis not present

## 2023-06-15 ENCOUNTER — Encounter (HOSPITAL_BASED_OUTPATIENT_CLINIC_OR_DEPARTMENT_OTHER): Payer: Medicare HMO | Admitting: General Surgery

## 2023-06-15 DIAGNOSIS — E1122 Type 2 diabetes mellitus with diabetic chronic kidney disease: Secondary | ICD-10-CM | POA: Diagnosis not present

## 2023-06-15 DIAGNOSIS — M1990 Unspecified osteoarthritis, unspecified site: Secondary | ICD-10-CM | POA: Diagnosis not present

## 2023-06-15 DIAGNOSIS — L97514 Non-pressure chronic ulcer of other part of right foot with necrosis of bone: Secondary | ICD-10-CM | POA: Diagnosis not present

## 2023-06-15 DIAGNOSIS — I129 Hypertensive chronic kidney disease with stage 1 through stage 4 chronic kidney disease, or unspecified chronic kidney disease: Secondary | ICD-10-CM | POA: Diagnosis not present

## 2023-06-15 DIAGNOSIS — E11621 Type 2 diabetes mellitus with foot ulcer: Secondary | ICD-10-CM | POA: Diagnosis not present

## 2023-06-15 DIAGNOSIS — E11319 Type 2 diabetes mellitus with unspecified diabetic retinopathy without macular edema: Secondary | ICD-10-CM | POA: Diagnosis not present

## 2023-06-15 DIAGNOSIS — Z8546 Personal history of malignant neoplasm of prostate: Secondary | ICD-10-CM | POA: Diagnosis not present

## 2023-06-15 DIAGNOSIS — Z833 Family history of diabetes mellitus: Secondary | ICD-10-CM | POA: Diagnosis not present

## 2023-06-15 DIAGNOSIS — M86171 Other acute osteomyelitis, right ankle and foot: Secondary | ICD-10-CM | POA: Diagnosis not present

## 2023-06-15 DIAGNOSIS — N1832 Chronic kidney disease, stage 3b: Secondary | ICD-10-CM | POA: Diagnosis not present

## 2023-06-15 LAB — GLUCOSE, CAPILLARY: Glucose-Capillary: 296 mg/dL — ABNORMAL HIGH (ref 70–99)

## 2023-06-15 NOTE — Progress Notes (Addendum)
[place], [name] ([id number]) [id number] Page 1 of 2 Visit Report for 06/15/2023 Arrival Information Details Patient Name: Date of Service: [name] 06/15/2023 1:00 PM Medical Record Number: [id number] Patient Account Number: [id number] Date of Birth/Sex: Treating RN: 09/20/1951 (72 y.o. [name], [name] Primary Care Jahzeel Poythress: [name] Other Clinician: [name] Referring Leverett Camplin: Treating Evelio Rueda/Extender: [name] in Treatment: 15 Visit Information History Since Last Visit All ordered tests and consults were completed: Yes Patient Arrived: Ambulatory Added or deleted any medications: No Arrival Time: 12:26 Any new allergies or adverse reactions: No Accompanied By: self Had a fall or experienced change in No Transfer Assistance: None activities of daily living that may affect Patient Identification Verified: Yes risk of falls: Secondary Verification Process Completed: Yes Signs or symptoms of abuse/neglect since last visito No Patient Requires Transmission-Based Precautions: No Hospitalized since last visit: No Patient Has Alerts: Yes Implantable device outside of the clinic excluding No Patient Alerts: Patient on Blood Thinner cellular tissue based products placed in the center since last visit: Pain Present Now: No Electronic Signature(s) Signed: 06/15/2023 2:15:02 PM By: Haywood Pao CHT EMT BS , , Entered By: Haywood Pao on 06/15/2023 11:15:02 -------------------------------------------------------------------------------- Encounter Discharge Information Details Patient Name: Date of Service: [name] 06/15/2023 1:00 PM Medical Record Number: [id number] Patient Account Number: [id number] Date of Birth/Sex: Treating RN: 06/27/1951 (72 y.o. [name] Primary Care Fadumo Heng: [name] Other Clinician: [name] Referring Haneefah Venturini: Treating Daxen Lanum/Extender:  [name] in Treatment: 15 Encounter Discharge Information Items Discharge Condition: Stable Ambulatory Status: Ambulatory Discharge Destination: Home Transportation: Private Auto Accompanied By: self Schedule Follow-up Appointment: No Clinical Summary of Care: Electronic Signature(s) Signed: 06/15/2023 3:17:16 PM By: Haywood Pao CHT EMT BS , , Entered By: Haywood Pao on 06/15/2023 12:17:15 [name], [name] ([id number]) [id number] Page 2 of 2 -------------------------------------------------------------------------------- Vitals Details Patient Name: Date of Service: [name] 06/15/2023 1:00 PM Medical Record Number: [id number] Patient Account Number: [id number] Date of Birth/Sex: Treating RN: 1951/08/29 (72 y.o. [name], [name] Primary Care Kaegan Hettich: [name] Other Clinician: [name] Referring Viren Lebeau: Treating Wren Gallaga/Extender: [name]: 15 Vital Signs Time Taken: 12:35 Temperature (F): 97.7 Height (in): 67 Pulse (bpm): 67 Weight (lbs): 154 Respiratory Rate (breaths/min): 20 Body Mass Index (BMI): 24.1 Blood Pressure (mmHg): 115/74 Capillary Blood Glucose (mg/dl): 220 Reference Range: 80 - 120 mg / dl Notes Blood Glucose shared with Dr. Lady Gary Electronic Signature(s) Signed: 06/15/2023 2:23:17 PM By: Haywood Pao CHT EMT BS , , Entered By: Haywood Pao on 06/15/2023 11:23:17

## 2023-06-15 NOTE — Progress Notes (Signed)
[place], [name] ([id number]) [id number] Page 1 of 1 Visit Report for 06/15/2023 SuperBill Details Patient Name: Date of Service: [name] 06/15/2023 Medical Record Number: [id number] Patient Account Number: [id number] Date of Birth/Sex: Treating RN: Mar 21, 1951 (72 y.o. [name], [name] Primary Care Provider: [name] Other Clinician: [name] Referring Provider: Treating Provider/Extender: [name]: 15 Diagnosis Coding ICD-10 Codes Code Description [phone] Non-pressure chronic ulcer of other part of right foot with necrosis of bone M86.171 Other acute osteomyelitis, right ankle and foot E11.621 Type 2 diabetes mellitus with foot ulcer I10 Essential (primary) hypertension N18.32 Chronic kidney disease, stage 3b Facility Procedures CPT4 Code Description Modifier Quantity [id number] G0277-(Facility Use Only) HBOT full body chamber, , 4 ICD-10 Diagnosis Description E11.621 Type 2 diabetes mellitus with foot ulcer L97.514 Non-pressure chronic ulcer of other part of right foot with necrosis of bone M86.171 Other acute osteomyelitis, right ankle and foot Physician Procedures Quantity CPT4 Code Description Modifier [id number] 99183 - WC PHYS HYPERBARIC OXYGEN THERAPY 1 ICD-10 Diagnosis Description E11.621 Type 2 diabetes mellitus with foot ulcer L97.514 Non-pressure chronic ulcer of other part of right foot with necrosis of bone M86.171 Other acute osteomyelitis, right ankle and foot Electronic Signature(s) Signed: 06/15/2023 3:16:34 PM By: Haywood Pao CHT EMT BS , , Signed: 06/15/2023 3:50:40 PM By: [name] MD FACS Entered By: Haywood Pao on 06/15/2023 12:16:33

## 2023-06-15 NOTE — Progress Notes (Addendum)
[place], [name] ([id number]) [id number] Page 1 of 2 Visit Report for 06/15/2023 HBO Details Patient Name: Date of Service: [name] 06/15/2023 1:00 PM Medical Record Number: [id number] Patient Account Number: [id number] Date of Birth/Sex: Treating RN: 07-23-1951 (72 y.o. [name], [name] Primary Care Ladona Rosten: [name] Other Clinician: [name] Referring Altariq Goodall: Treating Tamas Suen/Extender: [name] in Treatment: 15 HBO Treatment Course Details Treatment Course Number: 1 Ordering Macyn Shropshire: [name] T Treatments Ordered: otal 40 HBO Treatment Start Date: 04/06/2023 HBO Indication: Diabetic Ulcer(s) of the Lower Extremity Standard/Conservative Wound Care tried and failed greater than or equal to 30 days Wound #8 Right T Great oe HBO Treatment Details Treatment Number: 35 Patient Type: Outpatient Chamber Type: Monoplace Chamber Serial #: [id number] Treatment Protocol: 2.5 ATA with 90 minutes oxygen, with two 5 minute air breaks Treatment Details Compression Rate Down: 1.5 psi / minute De-Compression Rate Up: 2.0 psi / minute A breaks and breathing ir Compress Tx Pressure periods Decompress Decompress Begins Reached (leave unused spaces Begins Ends blank) Chamber Pressure (ATA 1 2.5 2.5 2.5 2.5 2.5 - - 2.5 1 ) Clock Time (24 hr) 12:47 12:59 13:29 13:34 14:04 14:09 - - 14:39 14:51 Treatment Length: 124 (minutes) Treatment Segments: 4 Vital Signs Capillary Blood Glucose Reference Range: 80 - 120 mg / dl HBO Diabetic Blood Glucose Intervention Range: <131 mg/dl or >188 mg/dl Type: Time Vitals Blood Pulse: Respiratory Temperature: Capillary Blood Glucose Pulse Action Taken: Pressure: Rate: Glucose (mg/dl): Meter #: Oximetry (%) Taken: Pre 12:35 115/74 67 20 97.7 296 informed physician of blood glucose leve.Marland Kitchen Post 14:54 200/92 69 20 98.1 Post 14:58 181/84 69 18 98.1 260 Treatment Response Treatment  Toleration: Well Treatment Completion Status: Treatment Completed without Adverse Event Treatment Notes Mr. Truxillo arrived after PCP visit with normal vital signs except 296 mg/dL. This was reported to Dr. Lady Gary. Patient prepared for treatment. Patient stated that he ate at 0900 and last had insulin yesterday. 4 oz of orange juice was prepared for patient to take into the chamber for safety/intervention based on experience with patient's treatments. After performing a safety check, patient was placed in the chamber which was compressed with 100% oxygen at a rate of 2 psi/min after confirming normal ear equalization. He tolerated the treatment and subsequent decompression at average 2.25 psi/min. He denied any issues with ear equalization and/or pain associated with barotrauma. Post-treatment vitals signs: CBG was 260 mg/dL without consuming the orange juice he took in for safety; systolic BP was 200 mmHg with machine and manual BP. After talking about how he felt about treatments his BP went down to 181/84 mmHg (manual). He was stable upon discharge. Additional Procedure Documentation Tissue Sevierity: Necrosis of bone Physician HBO Attestation: I certify that I supervised this HBO treatment in accordance with Medicare guidelines. A trained emergency response team is readily available per Yes hospital policies and procedures. Continue HBOT as ordered. Ishaaq [place], [name] ([id number]) [id number] Page 2 of 2 Electronic Signature(s) Signed: 06/15/2023 3:51:24 PM By: [name] MD FACS Previous Signature: 06/15/2023 3:15:16 PM Version By: Haywood Pao CHT EMT BS , , Previous Signature: 06/15/2023 2:29:40 PM Version By: Haywood Pao CHT EMT BS , , Entered By: [name] on 06/15/2023 12:51:24 -------------------------------------------------------------------------------- HBO Safety Checklist Details Patient Name: Date of Service: [name] RD, [name]  06/15/2023 1:00 PM Medical Record Number: [id number] Patient Account Number: [id number] Date of Birth/Sex: Treating RN: 1950-12-14 (72 y.o. [name] Primary Care Jalen Oberry: [name] Other Clinician:  [name] Referring Shealeigh Dunstan: Treating Veralyn Lopp/Extender: [name]: 15 HBO Safety Checklist Items Safety Checklist Consent Form Signed Patient voided / foley secured and emptied When did you last eato 0900- Last dose of injectable or oral agent Yesterday Ostomy pouch emptied and vented if applicable NA All implantable devices assessed, documented and approved NA Intravenous access site secured and place NA Valuables secured Linens and cotton and cotton/polyester blend (less than 51% polyester) Personal oil-based products / skin lotions / body lotions removed Wigs or hairpieces removed NA Smoking or tobacco materials removed NA Books / newspapers / magazines / loose paper removed Cologne, aftershave, perfume and deodorant removed Jewelry removed (may wrap wedding band) Make-up removed Hair care products removed Battery operated devices (external) removed Heating patches and chemical warmers removed Titanium eyewear removed Nail polish cured greater than 10 hours NA Casting material cured greater than 10 hours NA Hearing aids removed NA Loose dentures or partials removed dentures removed Prosthetics have been removed NA Patient demonstrates correct use of air break device (if applicable) Patient concerns have been addressed Patient grounding bracelet on and cord attached to chamber Specifics for Inpatients (complete in addition to above) Medication sheet sent with patient NA Intravenous medications needed or due during therapy sent with patient NA Drainage tubes (e.g. nasogastric tube or chest tube secured and vented) NA Endotracheal or Tracheotomy tube secured NA Cuff deflated of air and inflated with  saline NA Airway suctioned NA Notes Paper version used prior to treatment start. Electronic Signature(s) Signed: 06/15/2023 2:24:52 PM By: Haywood Pao CHT EMT BS , , Entered By: Haywood Pao on 06/15/2023 11:24:51

## 2023-06-16 ENCOUNTER — Encounter (HOSPITAL_BASED_OUTPATIENT_CLINIC_OR_DEPARTMENT_OTHER): Payer: Medicare HMO | Admitting: General Surgery

## 2023-06-16 DIAGNOSIS — Z833 Family history of diabetes mellitus: Secondary | ICD-10-CM | POA: Diagnosis not present

## 2023-06-16 DIAGNOSIS — E11621 Type 2 diabetes mellitus with foot ulcer: Secondary | ICD-10-CM | POA: Diagnosis not present

## 2023-06-16 DIAGNOSIS — N1832 Chronic kidney disease, stage 3b: Secondary | ICD-10-CM | POA: Diagnosis not present

## 2023-06-16 DIAGNOSIS — Z8546 Personal history of malignant neoplasm of prostate: Secondary | ICD-10-CM | POA: Diagnosis not present

## 2023-06-16 DIAGNOSIS — L97514 Non-pressure chronic ulcer of other part of right foot with necrosis of bone: Secondary | ICD-10-CM | POA: Diagnosis not present

## 2023-06-16 DIAGNOSIS — E11319 Type 2 diabetes mellitus with unspecified diabetic retinopathy without macular edema: Secondary | ICD-10-CM | POA: Diagnosis not present

## 2023-06-16 DIAGNOSIS — I129 Hypertensive chronic kidney disease with stage 1 through stage 4 chronic kidney disease, or unspecified chronic kidney disease: Secondary | ICD-10-CM | POA: Diagnosis not present

## 2023-06-16 DIAGNOSIS — E1122 Type 2 diabetes mellitus with diabetic chronic kidney disease: Secondary | ICD-10-CM | POA: Diagnosis not present

## 2023-06-16 DIAGNOSIS — M86171 Other acute osteomyelitis, right ankle and foot: Secondary | ICD-10-CM | POA: Diagnosis not present

## 2023-06-16 DIAGNOSIS — M1990 Unspecified osteoarthritis, unspecified site: Secondary | ICD-10-CM | POA: Diagnosis not present

## 2023-06-16 LAB — GLUCOSE, CAPILLARY
Glucose-Capillary: 260 mg/dL — ABNORMAL HIGH (ref 70–99)
Glucose-Capillary: 251 mg/dL — ABNORMAL HIGH (ref 70–99)
Glucose-Capillary: 247 mg/dL — ABNORMAL HIGH (ref 70–99)

## 2023-06-16 NOTE — Progress Notes (Signed)
[place], [name] ([id number]) [id number] Page 1 of 2 Visit Report for 06/16/2023 Problem List Details Patient Name: Date of Service: [name] 06/16/2023 7:30 A M Medical Record Number: [id number] Patient Account Number: [id number] Date of Birth/Sex: Treating RN: 12-Oct-1951 (72 y.o. [name] Primary Care Provider: [name] Other Clinician: [name] Referring Provider: Treating Provider/Extender: [name] in Treatment: 15 Active Problems ICD-10 Encounter Code Description Active Date MDM Diagnosis L97.514 Non-pressure chronic ulcer of other part of right foot with 03/02/2023 No Yes necrosis of bone M86.171 Other acute osteomyelitis, right ankle and foot 03/02/2023 No Yes E11.621 Type 2 diabetes mellitus with foot ulcer 03/02/2023 No Yes I10 Essential (primary) hypertension 03/02/2023 No Yes N18.32 Chronic kidney disease, stage 3b 03/02/2023 No Yes Inactive Problems Resolved Problems ICD-10 Code Description Active Date Resolved Date L97.512 Non-pressure chronic ulcer of other part of right foot with fat layer 03/09/2023 03/09/2023 exposed Electronic Signature(s) Signed: 06/16/2023 11:00:50 AM By: [name] EMT Signed: 06/16/2023 12:39:46 PM By: [name] MD FACS Entered By: [name] on 06/16/2023 11:00:50 -------------------------------------------------------------------------------- SuperBill Details Patient Name: Date of Service: [name] 06/16/2023 Medical Record Number: [id number] Patient Account Number: [id number] Date of Birth/Sex: Treating RN: 08-16-1951 (72 y.o. [name] [place], [place] ([id number]) [phone].pdf Page 2 of 2 Primary Care Provider: [name] Other Clinician: [name] Referring Provider: Treating Provider/Extender: [name] in Treatment: 15 Diagnosis Coding ICD-10 Codes Code  Description [phone] Non-pressure chronic ulcer of other part of right foot with necrosis of bone M86.171 Other acute osteomyelitis, right ankle and foot E11.621 Type 2 diabetes mellitus with foot ulcer I10 Essential (primary) hypertension N18.32 Chronic kidney disease, stage 3b Facility Procedures : CPT4 Code Description: [id number] G0277-(Facility Use Only) HBOT full body chamber, , ICD-10 Diagnosis Description E11.621 Type 2 diabetes mellitus with foot ulcer L97.514 Non-pressure chronic ulcer of other part of right foot wi M86.171 Other acute  osteomyelitis, right ankle and foot Modifier: th necrosis o Quantity: 4 f bone Physician Procedures : CPT4 Code Description Modifier [id number] 99183 - WC PHYS HYPERBARIC OXYGEN THERAPY ICD-10 Diagnosis Description E11.621 Type 2 diabetes mellitus with foot ulcer L97.514 Non-pressure chronic ulcer of other part of right foot with necrosis o M86.171 Other  acute osteomyelitis, right ankle and foot Quantity: 1 f bone Electronic Signature(s) Signed: 06/16/2023 11:00:34 AM By: [name] EMT Signed: 06/16/2023 12:39:46 PM By: [name] MD FACS Entered By: [name] on 06/16/2023 11:00:33

## 2023-06-16 NOTE — Progress Notes (Addendum)
[name], [name] ([id number]) [id number] Page 1 of 2 Visit Report for 06/16/2023 HBO Details Patient Name: Date of Service: [name] 06/16/2023 7:30 A M Medical Record Number: [id number] Patient Account Number: [id number] Date of Birth/Sex: Treating RN: Sep 03, 1951 (72 y.o. [name] Primary Care [name]: [name] Other Clinician: Karl [name] Referring [name]: Treating [name]/Extender: [name] in Treatment: 15 HBO Treatment Course Details Treatment Course Number: 1 Ordering [name]: [name] T Treatments Ordered: otal 40 HBO Treatment Start Date: 04/06/2023 HBO Indication: Diabetic Ulcer(s) of the Lower Extremity Standard/Conservative Wound Care tried and failed greater than or equal to 30 days Wound #8 Right T Great oe HBO Treatment Details Treatment Number: 36 Patient Type: Outpatient Chamber Type: Monoplace Chamber Serial #: [id number] Treatment Protocol: 2.5 ATA with 90 minutes oxygen, with two 5 minute air breaks Treatment Details Compression Rate Down: 2.0 psi / minute De-Compression Rate Up: 2.0 psi / minute A breaks and breathing ir Compress Tx Pressure periods Decompress Decompress Begins Reached (leave unused spaces Begins Ends blank) Chamber Pressure (ATA 1 2.5 2.5 2.5 2.5 2.5 - - 2.5 1 ) Clock Time (24 hr) 08:12 08:26 08:56 09:01 09:31 09:36 - - 10:06 10:16 Treatment Length: 124 (minutes) Treatment Segments: 4 Vital Signs Capillary Blood Glucose Reference Range: 80 - 120 mg / dl HBO Diabetic Blood Glucose Intervention Range: <131 mg/dl or >253 mg/dl Time Vitals Blood Respiratory Capillary Blood Glucose Pulse Action Type: Pulse: Temperature: Taken: Pressure: Rate: Glucose (mg/dl): Meter #: Oximetry (%) Taken: Pre 07:58 157/76 70 18 98 251 Post 10:22 209/93 76 18 97.4 247 Treatment Response Treatment Toleration: Well Treatment Completion Status: Treatment  Completed without Adverse Event Treatment Notes The patient was given 8 oz Glucerna per orders. Additional Procedure Documentation Tissue Sevierity: Necrosis of bone Physician HBO Attestation: I certify that I supervised this HBO treatment in accordance with Medicare guidelines. A trained emergency response team is readily available per Yes hospital policies and procedures. Continue HBOT as ordered. Yes Electronic Signature(s) Signed: 06/16/2023 12:40:19 PM By: [name] Guess MD FACS Previous Signature: 06/16/2023 11:03:36 AM Version By: [name] EMT Previous Signature: 06/16/2023 10:59:54 AM Version By: [name] EMT Entered By: [name] on 06/16/2023 12:40:19 [name], [name] ([id number]) [id number] Page 2 of 2 -------------------------------------------------------------------------------- HBO Safety Checklist Details Patient Name: Date of Service: [name] 06/16/2023 7:30 A M Medical Record Number: [id number] Patient Account Number: [id number] Date of Birth/Sex: Treating RN: 01-Oct-1951 (72 y.o. [name] Primary Care [name]: [name] Other Clinician: Karl [name] Referring [name]: Treating [name]/Extender: [name] in Treatment: 15 HBO Safety Checklist Items Safety Checklist Consent Form Signed Patient voided / foley secured and emptied When did you last eato 0700 Last dose of injectable or oral agent Yesterday 22 units Novalog Ostomy pouch emptied and vented if applicable NA All implantable devices assessed, documented and approved NA Intravenous access site secured and place NA Valuables secured Linens and cotton and cotton/polyester blend (less than 51% polyester) Personal oil-based products / skin lotions / body lotions removed Wigs or hairpieces removed NA Smoking or tobacco materials removed Books / newspapers / magazines / loose paper removed Cologne,  aftershave, perfume and deodorant removed Jewelry removed (may wrap wedding band) Make-up removed Hair care products removed Battery operated devices (external) removed Heating patches and chemical warmers removed NA Titanium eyewear removed NA Nail polish cured greater than 10 hours NA Casting material cured greater than 10 hours NA Hearing aids removed NA  Loose dentures or partials removed removed by patient Prosthetics have been removed NA Patient demonstrates correct use of air break device (if applicable) Patient concerns have been addressed Patient grounding bracelet on and cord attached to chamber Specifics for Inpatients (complete in addition to above) Medication sheet sent with patient NA Intravenous medications needed or due during therapy sent with patient NA Drainage tubes (e.g. nasogastric tube or chest tube secured and vented) NA Endotracheal or Tracheotomy tube secured NA Cuff deflated of air and inflated with saline NA Airway suctioned NA Notes The safety checklist was done before the treatment was started. Electronic Signature(s) Signed: 06/16/2023 10:57:12 AM By: [name] EMT Entered By: [name] on 06/16/2023 10:57:12

## 2023-06-16 NOTE — Progress Notes (Signed)
[place], [name] ([id number]) [id number] Page 1 of 2 Visit Report for 06/16/2023 Arrival Information Details Patient Name: Date of Service: [name] 06/16/2023 7:30 [name] M Medical Record Number: [id number] Patient Account Number: [id number] Date of Birth/Sex: Treating RN: 1951-03-27 (71 y.o. [name] Primary Care Gaynel Schaafsma: [name] Other Clinician: [name] Referring Jerzee Jerome: Treating Fallan Mccarey/Extender: [name] in Treatment: 15 Visit Information History Since Last Visit All ordered tests and consults were completed: Yes Patient Arrived: Ambulatory Added or deleted any medications: No Arrival Time: 07:54 Any new allergies or adverse reactions: No Accompanied By: None Had [name] fall or experienced change in No Transfer Assistance: None activities of daily living that may affect Patient Identification Verified: Yes risk of falls: Secondary Verification Process Completed: Yes Pain Present Now: No Patient Requires Transmission-Based Precautions: No Patient Has Alerts: Yes Patient Alerts: Patient on Blood Thinner Electronic Signature(s) Signed: 06/16/2023 10:54:13 AM By: [name] EMT Entered By: [name] on 06/16/2023 10:54:12 -------------------------------------------------------------------------------- Encounter Discharge Information Details Patient Name: Date of Service: [name], [name] 06/16/2023 7:30 [name] M Medical Record Number: [id number] Patient Account Number: [id number] Date of Birth/Sex: Treating RN: 01-20-1951 (72 y.o. [name] Primary Care Washington Whedbee: [name] Other Clinician: [name] Referring Ren Grasse: Treating Merlinda Wrubel/Extender: [name] in Treatment: 15 Encounter Discharge Information Items Discharge Condition: Stable Ambulatory Status: Ambulatory Discharge Destination: Home Transportation: Private Auto Accompanied By:  None Schedule Follow-up Appointment: Yes Clinical Summary of Care: Electronic Signature(s) Signed: 06/16/2023 11:01:47 AM By: [name] EMT Entered By: [name] on 06/16/2023 11:01:47 Vitals Details -------------------------------------------------------------------------------- [name] ([id number]) [id number] Page 2 of 2 Patient Name: Date of Service: [name] 06/16/2023 7:30 [name] M Medical Record Number: [id number] Patient Account Number: [id number] Date of Birth/Sex: Treating RN: [birth year] (72 y.o. [name] Primary Care Aemilia Dedrick: [name] Other Clinician: [name] Referring Teah Votaw: Treating Omir Cooprider/Extender: [name] in Treatment: 15 Vital Signs Time Taken: 07:58 Temperature (F): 98.0 Height (in): 67 Pulse (bpm): 70 Weight (lbs): 154 Respiratory Rate (breaths/min): 18 Body Mass Index (BMI): 24.1 Blood Pressure (mmHg): 157/76 Capillary Blood Glucose (mg/dl): 220 Reference Range: 80 - 120 mg / dl Electronic Signature(s) Signed: 06/16/2023 10:54:57 AM By: [name] EMT Entered By: [name] on 06/16/2023 10:54:57

## 2023-06-17 ENCOUNTER — Encounter (HOSPITAL_BASED_OUTPATIENT_CLINIC_OR_DEPARTMENT_OTHER): Payer: Medicare HMO | Admitting: General Surgery

## 2023-06-17 DIAGNOSIS — L97514 Non-pressure chronic ulcer of other part of right foot with necrosis of bone: Secondary | ICD-10-CM | POA: Diagnosis not present

## 2023-06-17 DIAGNOSIS — Z833 Family history of diabetes mellitus: Secondary | ICD-10-CM | POA: Diagnosis not present

## 2023-06-17 DIAGNOSIS — N1832 Chronic kidney disease, stage 3b: Secondary | ICD-10-CM | POA: Diagnosis not present

## 2023-06-17 DIAGNOSIS — M1990 Unspecified osteoarthritis, unspecified site: Secondary | ICD-10-CM | POA: Diagnosis not present

## 2023-06-17 DIAGNOSIS — I129 Hypertensive chronic kidney disease with stage 1 through stage 4 chronic kidney disease, or unspecified chronic kidney disease: Secondary | ICD-10-CM | POA: Diagnosis not present

## 2023-06-17 DIAGNOSIS — E11621 Type 2 diabetes mellitus with foot ulcer: Secondary | ICD-10-CM | POA: Diagnosis not present

## 2023-06-17 DIAGNOSIS — Z8546 Personal history of malignant neoplasm of prostate: Secondary | ICD-10-CM | POA: Diagnosis not present

## 2023-06-17 DIAGNOSIS — E1122 Type 2 diabetes mellitus with diabetic chronic kidney disease: Secondary | ICD-10-CM | POA: Diagnosis not present

## 2023-06-17 DIAGNOSIS — E11319 Type 2 diabetes mellitus with unspecified diabetic retinopathy without macular edema: Secondary | ICD-10-CM | POA: Diagnosis not present

## 2023-06-17 DIAGNOSIS — M86171 Other acute osteomyelitis, right ankle and foot: Secondary | ICD-10-CM | POA: Diagnosis not present

## 2023-06-17 LAB — GLUCOSE, CAPILLARY
Glucose-Capillary: 224 mg/dL — ABNORMAL HIGH (ref 70–99)
Glucose-Capillary: 215 mg/dL — ABNORMAL HIGH (ref 70–99)

## 2023-06-17 NOTE — Progress Notes (Addendum)
[place], [name] ([id number]) [id number] Page 1 of 2 Visit Report for 06/17/2023 HBO Details Patient Name: Date of Service: [name] 06/17/2023 7:30 A M Medical Record Number: [id number] Patient Account Number: [id number] Date of Birth/Sex: Treating RN: 1950-12-03 (72 y.o. [name] Primary Care Kama Cammarano: [name] Other Clinician: [name] Referring Annahi Short: Treating Daneil Beem/Extender: [name] in Treatment: 15 HBO Treatment Course Details Treatment Course Number: 1 Ordering Nemesio Castrillon: [name] T Treatments Ordered: otal 40 HBO Treatment Start Date: 04/06/2023 HBO Indication: Diabetic Ulcer(s) of the Lower Extremity Standard/Conservative Wound Care tried and failed greater than or equal to 30 days Wound #8 Right T Great oe HBO Treatment Details Treatment Number: 37 Patient Type: Outpatient Chamber Type: Monoplace Chamber Serial #: [id number] Treatment Protocol: 2.5 ATA with 90 minutes oxygen, with two 5 minute air breaks Treatment Details Compression Rate Down: 2.0 psi / minute De-Compression Rate Up: 2.0 psi / minute A breaks and breathing ir Compress Tx Pressure periods Decompress Decompress Begins Reached (leave unused spaces Begins Ends blank) Chamber Pressure (ATA 1 2.5 2.5 2.5 2.5 2.5 - - 2.5 1 ) Clock Time (24 hr) 08:05 08:19 08:49 08:54 09:24 09:29 - - 09:59 10:09 Treatment Length: 124 (minutes) Treatment Segments: 4 Vital Signs Capillary Blood Glucose Reference Range: 80 - 120 mg / dl HBO Diabetic Blood Glucose Intervention Range: <131 mg/dl or >324 mg/dl Time Vitals Blood Respiratory Capillary Blood Glucose Pulse Action Type: Pulse: Temperature: Taken: Pressure: Rate: Glucose (mg/dl): Meter #: Oximetry (%) Taken: Pre 07:49 196/101 77 18 97.4 224 Post 10:16 2217/115 79 18 97.5 215 Treatment Response Treatment Toleration: Well Treatment Completion Status: Treatment  Completed without Adverse Event Treatment Notes The patient was given 8 oz Glucerna before treatment per orders. Dr. Lady Gary informed of pretreatment blood pressure. Dr. Lady Gary informed of post treatment blood pressure. Additional Procedure Documentation Tissue Sevierity: Necrosis of bone Physician HBO Attestation: I certify that I supervised this HBO treatment in accordance with Medicare guidelines. A trained emergency response team is readily available per Yes hospital policies and procedures. Continue HBOT as ordered. Yes Electronic Signature(s) Signed: 06/17/2023 1:07:54 PM By: [name] MD FACS Previous Signature: 06/17/2023 11:07:07 AM Version By: [name] EMT Previous Signature: 06/17/2023 8:37:45 AM Version By: [name] EMT Entered By: [name] on 06/17/2023 10:07:53 [place], [name] ([id number]) [id number] Page 2 of 2 -------------------------------------------------------------------------------- HBO Safety Checklist Details Patient Name: Date of Service: [name] 06/17/2023 7:30 A M Medical Record Number: [id number] Patient Account Number: [id number] Date of Birth/Sex: Treating RN: Nov 06, 1950 (72 y.o. [name] Primary Care Arcenia Scarbro: [name] Other Clinician: [name] Referring Kelley Knoth: Treating Montario Zilka/Extender: [name] in Treatment: 15 HBO Safety Checklist Items Safety Checklist Consent Form Signed Patient voided / foley secured and emptied When did you last eato 0700 Last dose of injectable or oral agent Last night 22 units Novalog Ostomy pouch emptied and vented if applicable NA All implantable devices assessed, documented and approved NA Intravenous access site secured and place NA Valuables secured Linens and cotton and cotton/polyester blend (less than 51% polyester) Personal oil-based products / skin lotions / body lotions removed Wigs or hairpieces  removed NA Smoking or tobacco materials removed Books / newspapers / magazines / loose paper removed Cologne, aftershave, perfume and deodorant removed Jewelry removed (may wrap wedding band) Make-up removed NA Hair care products removed Battery operated devices (external) removed Heating patches and chemical warmers removed Titanium eyewear removed NA Nail polish  cured greater than 10 hours NA Casting material cured greater than 10 hours NA Hearing aids removed NA Loose dentures or partials removed removed by patient Prosthetics have been removed NA Patient demonstrates correct use of air break device (if applicable) Patient concerns have been addressed Patient grounding bracelet on and cord attached to chamber Specifics for Inpatients (complete in addition to above) Medication sheet sent with patient NA Intravenous medications needed or due during therapy sent with patient NA Drainage tubes (e.g. nasogastric tube or chest tube secured and vented) NA Endotracheal or Tracheotomy tube secured NA Cuff deflated of air and inflated with saline NA Airway suctioned NA Notes The safety checklist was done before the treatment was started. Electronic Signature(s) Signed: 06/17/2023 8:36:30 AM By: [name] EMT Entered By: [name] on 06/17/2023 05:36:30

## 2023-06-17 NOTE — Progress Notes (Signed)
[place], [name] ([id number]) [id number] Page 1 of 2 Visit Report for 06/17/2023 Problem List Details Patient Name: Date of Service: [name] 06/17/2023 7:30 A M Medical Record Number: [id number] Patient Account Number: [id number] Date of Birth/Sex: Treating RN: 06/29/1951 (72 y.o. 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/02/2023 No Yes necrosis of bone M86.171 Other acute osteomyelitis, right ankle and foot 03/02/2023 No Yes E11.621 Type 2 diabetes mellitus with foot ulcer 03/02/2023 No Yes I10 Essential (primary) hypertension 03/02/2023 No Yes N18.32 Chronic kidney disease, stage 3b 03/02/2023 No Yes Inactive Problems Resolved Problems ICD-10 Code Description Active Date Resolved Date L97.512 Non-pressure chronic ulcer of other part of right foot with fat layer 03/09/2023 03/09/2023 exposed Electronic Signature(s) Signed: 06/17/2023 11:07:41 AM By: [name] EMT Signed: 06/17/2023 1:07:25 PM By: [name] MD FACS Entered By: [name] on 06/17/2023 08:07:40 -------------------------------------------------------------------------------- SuperBill Details Patient Name: Date of Service: [name], [name] 06/17/2023 Medical Record Number: [id number] Patient Account Number: [id number] Date of Birth/Sex: Treating RN: 18-Apr-1951 (72 y.o. [name] [name], [name] ([id number]) [phone].pdf Page 2 of 2 Primary Care Provider: [name] Other Clinician: [name] Referring Provider: Treating Provider/Extender: [name] in Treatment: 15 Diagnosis Coding ICD-10 Codes Code  Description [phone] Non-pressure chronic ulcer of other part of right foot with necrosis of bone M86.171 Other acute osteomyelitis, right ankle and foot E11.621 Type 2 diabetes mellitus with foot ulcer I10 Essential (primary) hypertension N18.32 Chronic kidney disease, stage 3b Facility Procedures : CPT4 Code Description: [id number] G0277-(Facility Use Only) HBOT full body chamber, , ICD-10 Diagnosis Description E11.621 Type 2 diabetes mellitus with foot ulcer L97.514 Non-pressure chronic ulcer of other part of right foot wi M86.171 Other acute  osteomyelitis, right ankle and foot Modifier: th necrosis o Quantity: 4 f bone Physician Procedures : CPT4 Code Description Modifier [id number] - WC PHYS HYPERBARIC OXYGEN THERAPY ICD-10 Diagnosis Description E11.621 Type 2 diabetes mellitus with foot ulcer L97.514 Non-pressure chronic ulcer of other part of right foot with necrosis o M86.171 Other  acute osteomyelitis, right ankle and foot Quantity: 1 f bone Electronic Signature(s) Signed: 06/17/2023 11:07:34 AM By: [name] EMT Signed: 06/17/2023 1:07:25 PM By: [name] MD FACS Entered By: [name] on 06/17/2023 08:07:33

## 2023-06-17 NOTE — Progress Notes (Addendum)
[place], [name] ([id number]) [id number] Page 1 of 2 Visit Report for 06/17/2023 Arrival Information Details Patient Name: Date of Service: [name] 06/17/2023 7:30 [name] M Medical Record Number: [id number] Patient Account Number: [id number] Date of Birth/Sex: Treating RN: 1951-02-20 (72 y.o. [name] Primary Care Shrinika Blatz: [name] Other Clinician: [name] Referring Evangaline Jou: Treating Colin Norment/Extender: [name] in Treatment: 15 Visit Information History Since Last Visit All ordered tests and consults were completed: Yes Patient Arrived: Ambulatory Added or deleted any medications: No Arrival Time: 07:46 Any new allergies or adverse reactions: No Accompanied By: None Had [name] fall or experienced change in No Transfer Assistance: None activities of daily living that may affect Patient Identification Verified: Yes risk of falls: Secondary Verification Process Completed: Yes Signs or symptoms of abuse/neglect since last visito No Patient Requires Transmission-Based Precautions: No Hospitalized since last visit: No Patient Has Alerts: Yes Implantable device outside of the clinic excluding No Patient Alerts: Patient on Blood Thinner cellular tissue based products placed in the center since last visit: Pain Present Now: No Electronic Signature(s) Signed: 06/17/2023 8:34:06 AM By: [name] EMT Entered By: [name] on 06/17/2023 08:34:06 -------------------------------------------------------------------------------- Encounter Discharge Information Details Patient Name: Date of Service: [name], [name] 06/17/2023 7:30 [name] M Medical Record Number: [id number] Patient Account Number: [id number] Date of Birth/Sex: Treating RN: Oct 19, 1951 (72 y.o. [name] Primary Care Shalea Tomczak: [name] Other Clinician: [name] Referring Mafalda Mcginniss: Treating Annagrace Carr/Extender: [name] in Treatment: 15 Encounter Discharge Information Items Discharge Condition: Stable Ambulatory Status: Ambulatory Discharge Destination: Home Transportation: Private Auto Accompanied By: None Schedule Follow-up Appointment: Yes Clinical Summary of Care: Electronic Signature(s) Signed: 06/17/2023 11:08:19 AM By: [name] EMT Entered By: [name] on 06/17/2023 11:08:19 [place], [name] ([id number]) [id number] Page 2 of 2 -------------------------------------------------------------------------------- Vitals Details Patient Name: Date of Service: [name] 06/17/2023 7:30 [name] M Medical Record Number: [id number] Patient Account Number: [id number] Date of Birth/Sex: Treating RN: 04-29-1951 (72 y.o. [name] Primary Care Gracin Mcpartland: [name] Other Clinician: [name] Referring Rosann Gorum: Treating Jamita Mckelvin/Extender: [name]: 15 Vital Signs Time Taken: 07:49 Temperature (F): 97.4 Height (in): 67 Pulse (bpm): 77 Weight (lbs): 154 Respiratory Rate (breaths/min): 18 Body Mass Index (BMI): 24.1 Blood Pressure (mmHg): 196/101 Capillary Blood Glucose (mg/dl): 350 Reference Range: 80 - 120 mg / dl Electronic Signature(s) Signed: 06/17/2023 8:34:40 AM By: [name] EMT Entered By: [name] on 06/17/2023 08:34:40

## 2023-06-18 ENCOUNTER — Encounter (HOSPITAL_BASED_OUTPATIENT_CLINIC_OR_DEPARTMENT_OTHER): Payer: Medicare HMO | Admitting: General Surgery

## 2023-06-18 ENCOUNTER — Telehealth: Payer: Self-pay | Admitting: Family Medicine

## 2023-06-18 DIAGNOSIS — L97514 Non-pressure chronic ulcer of other part of right foot with necrosis of bone: Secondary | ICD-10-CM | POA: Diagnosis not present

## 2023-06-18 DIAGNOSIS — E11319 Type 2 diabetes mellitus with unspecified diabetic retinopathy without macular edema: Secondary | ICD-10-CM | POA: Diagnosis not present

## 2023-06-18 DIAGNOSIS — E1122 Type 2 diabetes mellitus with diabetic chronic kidney disease: Secondary | ICD-10-CM | POA: Diagnosis not present

## 2023-06-18 DIAGNOSIS — Z8546 Personal history of malignant neoplasm of prostate: Secondary | ICD-10-CM | POA: Diagnosis not present

## 2023-06-18 DIAGNOSIS — N1832 Chronic kidney disease, stage 3b: Secondary | ICD-10-CM | POA: Diagnosis not present

## 2023-06-18 DIAGNOSIS — M1990 Unspecified osteoarthritis, unspecified site: Secondary | ICD-10-CM | POA: Diagnosis not present

## 2023-06-18 DIAGNOSIS — Z833 Family history of diabetes mellitus: Secondary | ICD-10-CM | POA: Diagnosis not present

## 2023-06-18 DIAGNOSIS — M86171 Other acute osteomyelitis, right ankle and foot: Secondary | ICD-10-CM | POA: Diagnosis not present

## 2023-06-18 DIAGNOSIS — E1142 Type 2 diabetes mellitus with diabetic polyneuropathy: Secondary | ICD-10-CM

## 2023-06-18 DIAGNOSIS — I129 Hypertensive chronic kidney disease with stage 1 through stage 4 chronic kidney disease, or unspecified chronic kidney disease: Secondary | ICD-10-CM | POA: Diagnosis not present

## 2023-06-18 DIAGNOSIS — E11621 Type 2 diabetes mellitus with foot ulcer: Secondary | ICD-10-CM | POA: Diagnosis not present

## 2023-06-18 LAB — GLUCOSE, CAPILLARY
Glucose-Capillary: 244 mg/dL — ABNORMAL HIGH (ref 70–99)
Glucose-Capillary: 79 mg/dL (ref 70–99)
Glucose-Capillary: 87 mg/dL (ref 70–99)

## 2023-06-18 NOTE — Progress Notes (Signed)
[place], [name] ([id number]) [id number] Page 1 of 2 Visit Report for 06/18/2023 Problem List Details Patient Name: Date of Service: [name] 06/18/2023 7:30 A M Medical Record Number: [id number] Patient Account Number: [id number] Date of Birth/Sex: Treating RN: 01/29/1951 (72 y.o. 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/02/2023 No Yes necrosis of bone M86.171 Other acute osteomyelitis, right ankle and foot 03/02/2023 No Yes E11.621 Type 2 diabetes mellitus with foot ulcer 03/02/2023 No Yes I10 Essential (primary) hypertension 03/02/2023 No Yes N18.32 Chronic kidney disease, stage 3b 03/02/2023 No Yes Inactive Problems Resolved Problems ICD-10 Code Description Active Date Resolved Date L97.512 Non-pressure chronic ulcer of other part of right foot with fat layer 03/09/2023 03/09/2023 exposed Electronic Signature(s) Signed: 06/18/2023 11:29:31 AM By: [name] EMT Signed: 06/18/2023 12:59:10 PM By: [name] MD FACS Entered By: [name] on 06/18/2023 08:29:31 -------------------------------------------------------------------------------- SuperBill Details Patient Name: Date of Service: [name], [name] 06/18/2023 Medical Record Number: [id number] Patient Account Number: [id number] Date of Birth/Sex: Treating RN: 09/18/1951 (72 y.o. [name] [place], Kai ([id number]) [phone].pdf Page 2 of 2 Primary Care Provider: [name] Other Clinician: [name] Referring Provider: Treating Provider/Extender: [name] in Treatment: 15 Diagnosis Coding ICD-10 Codes Code  Description [phone] Non-pressure chronic ulcer of other part of right foot with necrosis of bone M86.171 Other acute osteomyelitis, right ankle and foot E11.621 Type 2 diabetes mellitus with foot ulcer I10 Essential (primary) hypertension N18.32 Chronic kidney disease, stage 3b Facility Procedures : CPT4 Code Description: [id number] G0277-(Facility Use Only) HBOT full body chamber, , ICD-10 Diagnosis Description E11.621 Type 2 diabetes mellitus with foot ulcer L97.514 Non-pressure chronic ulcer of other part of right foot wi M86.171 Other acute  osteomyelitis, right ankle and foot Modifier: th necrosis o Quantity: 4 f bone Physician Procedures : CPT4 Code Description Modifier [id number] 99183 - WC PHYS HYPERBARIC OXYGEN THERAPY ICD-10 Diagnosis Description E11.621 Type 2 diabetes mellitus with foot ulcer L97.514 Non-pressure chronic ulcer of other part of right foot with necrosis o M86.171 Other  acute osteomyelitis, right ankle and foot Quantity: 1 f bone Electronic Signature(s) Signed: 06/18/2023 11:29:26 AM By: [name] EMT Signed: 06/18/2023 12:59:10 PM By: [name] MD FACS Entered By: [name] on 06/18/2023 31:51:76

## 2023-06-18 NOTE — Progress Notes (Addendum)
[place], [name] ([id number]) [id number] Page 1 of 2 Visit Report for 06/18/2023 Arrival Information Details Patient Name: Date of Service: [name] 06/18/2023 7:30 A M Medical Record Number: [id number] Patient Account Number: [id number] Date of Birth/Sex: Treating RN: Oct 31, 1950 (72 y.o. [name] Primary Care Melva Faux: [name] Other Clinician: [name] Referring Adaley Kiene: Treating Torah Pinnock/Extender: [name] in Treatment: 15 Visit Information History Since Last Visit All ordered tests and consults were completed: Yes Patient Arrived: Ambulatory Added or deleted any medications: No Arrival Time: 07:44 Any new allergies or adverse reactions: No Accompanied By: None Had a fall or experienced change in No Transfer Assistance: None activities of daily living that may affect Patient Identification Verified: Yes risk of falls: Secondary Verification Process Completed: Yes Signs or symptoms of abuse/neglect since last visito No Patient Requires Transmission-Based Precautions: No Hospitalized since last visit: No Patient Has Alerts: Yes Implantable device outside of the clinic excluding No Patient Alerts: Patient on Blood Thinner cellular tissue based products placed in the center since last visit: Pain Present Now: No Electronic Signature(s) Signed: 06/18/2023 11:17:13 AM By: [name] EMT Entered By: [name] on 06/18/2023 11:17:13 -------------------------------------------------------------------------------- Encounter Discharge Information Details Patient Name: Date of Service: [name] RD, [name] 06/18/2023 7:30 A M Medical Record Number: [id number] Patient Account Number: [id number] Date of Birth/Sex: Treating RN: Feb 02, 1951 (72 y.o. [name] Primary Care Bernisha Verma: [name] Other Clinician: [name] Referring Isreal Moline: Treating Sherida Dobkins/Extender: [name] in Treatment: 15 Encounter Discharge Information Items Discharge Condition: Stable Ambulatory Status: Ambulatory Discharge Destination: Home Transportation: Private Auto Accompanied By: None Schedule Follow-up Appointment: Yes Clinical Summary of Care: Electronic Signature(s) Signed: 06/18/2023 11:33:54 AM By: [name] EMT Entered By: [name] on 06/18/2023 11:33:54 [place], [name] ([id number]) [id number] Page 2 of 2 -------------------------------------------------------------------------------- Vitals Details Patient Name: Date of Service: [name] 06/18/2023 7:30 A M Medical Record Number: [id number] Patient Account Number: [id number] Date of Birth/Sex: Treating RN: 20-Jun-1951 (72 y.o. [name] Primary Care Jonatan Wilsey: [name] Other Clinician: [name] Referring Jex Strausbaugh: Treating Servando Kyllonen/Extender: [name] in Treatment: 15 Vital Signs Time Taken: 07:50 Temperature (F): 97.3 Height (in): 67 Pulse (bpm): 73 Weight (lbs): 154 Respiratory Rate (breaths/min): 18 Body Mass Index (BMI): 24.1 Blood Pressure (mmHg): 131/73 Capillary Blood Glucose (mg/dl): 948 Reference Range: 80 - 120 mg / dl Electronic Signature(s) Signed: 06/18/2023 11:17:42 AM By: [name] EMT Entered By: [name] on 06/18/2023 11:17:42

## 2023-06-18 NOTE — Progress Notes (Addendum)
[place], [name] ([id number]) [id number] Page 1 of 2 Visit Report for 06/18/2023 HBO Details Patient Name: Date of Service: [name] 06/18/2023 7:30 A M Medical Record Number: [id number] Patient Account Number: [id number] Date of Birth/Sex: Treating RN: [birth year] (72 y.o. [name] Primary Care [name]: [name] Other Clinician: Karl [name] Referring [name]: Treating [name]: [name] in Treatment: 15 HBO Treatment Course Details Treatment Course Number: 1 Ordering Zaedyn Covin: [name] T Treatments Ordered: otal 40 HBO Treatment Start Date: 04/06/2023 HBO Indication: Diabetic Ulcer(s) of the Lower Extremity Standard/Conservative Wound Care tried and failed greater than or equal to 30 days Wound #8 Right T Great oe HBO Treatment Details Treatment Number: 38 Patient Type: Outpatient Chamber Type: Monoplace Chamber Serial #: [id number] Treatment Protocol: 2.5 ATA with 90 minutes oxygen, with two 5 minute air breaks Treatment Details Compression Rate Down: 2.0 psi / minute De-Compression Rate Up: 2.0 psi / minute A breaks and breathing ir Compress Tx Pressure periods Decompress Decompress Begins Reached (leave unused spaces Begins Ends blank) Chamber Pressure (ATA 1 2.5 2.5 2.5 2.5 2.5 - - 2.5 1 ) Clock Time (24 hr) 08:19 08:32 09:02 09:07 09:37 09:42 - - 10:12 10:14 Treatment Length: 115 (minutes) Treatment Segments: 4 Vital Signs Capillary Blood Glucose Reference Range: 80 - 120 mg / dl HBO Diabetic Blood Glucose Intervention Range: <131 mg/dl or >536 mg/dl Type: Time Vitals Blood Pulse: Respiratory Temperature: Capillary Blood Glucose Pulse Action Taken: Pressure: Rate: Glucose (mg/dl): Meter #: Oximetry (%) Taken: Pre 07:50 131/73 73 18 97.3 244 Post 12:21 178/90 75 18 97.3 79 Patient given 8oz Glucerna 4 oz orange juice.Marland Kitchen Post 10:41 87 Treatment Response Treatment  Toleration: Well Treatment Completion Status: Treatment Completed without Adverse Event Treatment Notes Post treatment blood sugar was 79. The patient was given 8oz Glucerna, 4oz orange juice. The patient blood sugar was rechecked at 1041 results 87. Dr. Lady Gary informed. Additional Procedure Documentation Tissue Sevierity: Necrosis of bone Physician HBO Attestation: I certify that I supervised this HBO treatment in accordance with Medicare guidelines. A trained emergency response team is readily available per Yes hospital policies and procedures. Continue HBOT as ordered. Yes Electronic Signature(s) Signed: 06/18/2023 2:28:37 PM By: [name] Guess MD FACS Previous Signature: 06/18/2023 11:28:59 AM Version By: [name] EMT [name], [name] ([id number]) [id number] Page 2 of 2 Entered By: [name] on 06/18/2023 11:28:37 -------------------------------------------------------------------------------- HBO Safety Checklist Details Patient Name: Date of Service: [name] 06/18/2023 7:30 A M Medical Record Number: [id number] Patient Account Number: [id number] Date of Birth/Sex: Treating RN: 14-Sep-1951 (72 y.o. [name] Primary Care Evadean Sproule: [name] Other Clinician: Karl [name] Referring [name]: Treating [name]/Extender: [name] in Treatment: 15 HBO Safety Checklist Items Safety Checklist Consent Form Signed Patient voided / foley secured and emptied When did you last eato 0700 Last dose of injectable or oral agent Last night 22 units Novalog Ostomy pouch emptied and vented if applicable NA All implantable devices assessed, documented and approved NA Intravenous access site secured and place NA Valuables secured Linens and cotton and cotton/polyester blend (less than 51% polyester) Personal oil-based products / skin lotions / body lotions removed Wigs or hairpieces removed NA Smoking  or tobacco materials removed Books / newspapers / magazines / loose paper removed Cologne, aftershave, perfume and deodorant removed Jewelry removed (may wrap wedding band) Make-up removed NA Hair care products removed Battery operated devices (external) removed Heating patches and chemical warmers removed Titanium eyewear removed  NA Nail polish cured greater than 10 hours NA Casting material cured greater than 10 hours NA Hearing aids removed NA Loose dentures or partials removed removed by patient Prosthetics have been removed NA Patient demonstrates correct use of air break device (if applicable) Patient concerns have been addressed Patient grounding bracelet on and cord attached to chamber Specifics for Inpatients (complete in addition to above) Medication sheet sent with patient NA Intravenous medications needed or due during therapy sent with patient NA Drainage tubes (e.g. nasogastric tube or chest tube secured and vented) NA Endotracheal or Tracheotomy tube secured NA Cuff deflated of air and inflated with saline NA Airway suctioned NA Notes The safety checklist was done before the treatment was started. Electronic Signature(s) Signed: 06/18/2023 11:19:20 AM By: [name] EMT Entered By: [name] on 06/18/2023 08:19:20

## 2023-06-18 NOTE — Telephone Encounter (Signed)
Winona Legato - Girlfriend  (She is on DPR signed on 07/06/19).  Called to request a referral for Pt to see an Endocrinologist.  Please advise.

## 2023-06-19 ENCOUNTER — Encounter (HOSPITAL_BASED_OUTPATIENT_CLINIC_OR_DEPARTMENT_OTHER): Payer: Medicare HMO | Admitting: General Surgery

## 2023-06-19 DIAGNOSIS — N1832 Chronic kidney disease, stage 3b: Secondary | ICD-10-CM | POA: Diagnosis not present

## 2023-06-19 DIAGNOSIS — I129 Hypertensive chronic kidney disease with stage 1 through stage 4 chronic kidney disease, or unspecified chronic kidney disease: Secondary | ICD-10-CM | POA: Diagnosis not present

## 2023-06-19 DIAGNOSIS — L97514 Non-pressure chronic ulcer of other part of right foot with necrosis of bone: Secondary | ICD-10-CM | POA: Diagnosis not present

## 2023-06-19 DIAGNOSIS — Z8546 Personal history of malignant neoplasm of prostate: Secondary | ICD-10-CM | POA: Diagnosis not present

## 2023-06-19 DIAGNOSIS — E11621 Type 2 diabetes mellitus with foot ulcer: Secondary | ICD-10-CM | POA: Diagnosis not present

## 2023-06-19 DIAGNOSIS — L97512 Non-pressure chronic ulcer of other part of right foot with fat layer exposed: Secondary | ICD-10-CM | POA: Diagnosis not present

## 2023-06-19 DIAGNOSIS — Z833 Family history of diabetes mellitus: Secondary | ICD-10-CM | POA: Diagnosis not present

## 2023-06-19 DIAGNOSIS — M1990 Unspecified osteoarthritis, unspecified site: Secondary | ICD-10-CM | POA: Diagnosis not present

## 2023-06-19 DIAGNOSIS — E11319 Type 2 diabetes mellitus with unspecified diabetic retinopathy without macular edema: Secondary | ICD-10-CM | POA: Diagnosis not present

## 2023-06-19 DIAGNOSIS — M86171 Other acute osteomyelitis, right ankle and foot: Secondary | ICD-10-CM | POA: Diagnosis not present

## 2023-06-19 DIAGNOSIS — E1122 Type 2 diabetes mellitus with diabetic chronic kidney disease: Secondary | ICD-10-CM | POA: Diagnosis not present

## 2023-06-19 LAB — GLUCOSE, CAPILLARY
Glucose-Capillary: 230 mg/dL — ABNORMAL HIGH (ref 70–99)
Glucose-Capillary: 69 mg/dL — ABNORMAL LOW (ref 70–99)
Glucose-Capillary: 74 mg/dL (ref 70–99)

## 2023-06-19 NOTE — Progress Notes (Addendum)
[place], [name] ([id number]) [id number] Page 1 of 2 Visit Report for 06/19/2023 HBO Details Patient Name: Date of Service: [name] 06/19/2023 9:30 A M Medical Record Number: [id number] Patient Account Number: [id number] Date of Birth/Sex: Treating RN: Apr 30, 1951 (72 y.o. [name] Primary Care Tiron Suski: [name] Other Clinician: [name] Referring Chelisa Hennen: Treating Rozelle Caudle/Extender: [name] in Treatment: 15 HBO Treatment Course Details Treatment Course Number: 1 Ordering Susy Placzek: [name] T Treatments Ordered: otal 40 HBO Treatment Start Date: 04/06/2023 HBO Indication: Diabetic Ulcer(s) of the Lower Extremity Standard/Conservative Wound Care tried and failed greater than or equal to 30 days Wound #8 Right T Great oe HBO Treatment Details Treatment Number: 39 Patient Type: Outpatient Chamber Type: Monoplace Chamber Serial #: [id number] Treatment Protocol: 2.5 ATA with 90 minutes oxygen, with two 5 minute air breaks Treatment Details Compression Rate Down: 2.0 psi / minute De-Compression Rate Up: 2.0 psi / minute A breaks and breathing ir Compress Tx Pressure periods Decompress Decompress Begins Reached (leave unused spaces Begins Ends blank) Chamber Pressure (ATA 1 2.5 2.5 2.5 2.5 2.5 - - 2.5 1 ) Clock Time (24 hr) 08:48 09:00 09:30 09:35 10:05 10:10 - - 10:40 10:56 Treatment Length: 128 (minutes) Treatment Segments: 4 Vital Signs Capillary Blood Glucose Reference Range: 80 - 120 mg / dl HBO Diabetic Blood Glucose Intervention Range: <131 mg/dl or >932 mg/dl Time Vitals Blood Respiratory Capillary Blood Glucose Pulse Action Type: Pulse: Temperature: Taken: Pressure: Rate: Glucose (mg/dl): Meter #: Oximetry (%) Taken: Pre 08:14 230 Post 10:55 191/98 69 18 97.3 69 Pre 08:45 178/82 84 18 97.1 Post 11:06 74 Treatment Response Treatment Toleration: Well Treatment  Completion Status: Treatment Completed without Adverse Event Treatment Notes The patient come to the HBO after Wound Care Clinic visit. The patient was given 8 oz of Glucerna to drink before treatment per orders. Post treatment blood sugar was 69. The patient was given 8 oz of Glucerna and 4 oz of orange juice per Dr. Lady Gary. The blood sugar was rechecked before leaving with results of 74 at 1106. The patient was discharged after informing Dr. Lady Gary. Additional Procedure Documentation Tissue Sevierity: Necrosis of bone Physician HBO Attestation: I certify that I supervised this HBO treatment in accordance with Medicare guidelines. A trained emergency response team is readily available per Yes hospital policies and procedures. Continue HBOT as ordered. Yes Electronic Signature(s) Signed: 06/19/2023 12:40:27 PM By: [name] MD Lou Cal, Signed: 06/19/2023 12:40:27 PM By: [name] MD FACS Zephan ([id number]) [id number] Page 2 of 2 Previous Signature: 06/19/2023 11:52:24 AM Version By: [name] EMT Entered By: [name] on 06/19/2023 09:40:27 -------------------------------------------------------------------------------- HBO Safety Checklist Details Patient Name: Date of Service: [facility] RD, [name] 06/19/2023 9:30 A M Medical Record Number: [id number] Patient Account Number: [id number] Date of Birth/Sex: Treating RN: Jun 24, 1951 (72 y.o. [name] Primary Care Zaryah Seckel: [name] Other Clinician: [name] Referring Anees Vanecek: Treating Brynne Doane/Extender: [name]: 15 HBO Safety Checklist Items Safety Checklist Consent Form Signed Patient voided / foley secured and emptied When did you last eato 0700 Last dose of injectable or oral agent Last night 2100 22 units Novalog Ostomy pouch emptied and vented if applicable NA All implantable devices assessed, documented and  approved NA Intravenous access site secured and place NA Valuables secured Linens and cotton and cotton/polyester blend (less than 51% polyester) Personal oil-based products / skin lotions / body lotions removed Wigs or hairpieces removed NA Smoking or  tobacco materials removed Books / newspapers / magazines / loose paper removed Cologne, aftershave, perfume and deodorant removed Jewelry removed (may wrap wedding band) Make-up removed NA Hair care products removed Battery operated devices (external) removed Heating patches and chemical warmers removed Titanium eyewear removed Nail polish cured greater than 10 hours NA Casting material cured greater than 10 hours NA Hearing aids removed NA Loose dentures or partials removed removed by patient Prosthetics have been removed NA Patient demonstrates correct use of air break device (if applicable) Patient concerns have been addressed Patient grounding bracelet on and cord attached to chamber Specifics for Inpatients (complete in addition to above) Medication sheet sent with patient NA Intravenous medications needed or due during therapy sent with patient NA Drainage tubes (e.g. nasogastric tube or chest tube secured and vented) NA Endotracheal or Tracheotomy tube secured NA Cuff deflated of air and inflated with saline NA Airway suctioned NA Notes The safety checklist was done before the treatment was started. Electronic Signature(s) Signed: 06/19/2023 11:44:57 AM By: [name] EMT Entered By: [name] on 06/19/2023 08:44:57

## 2023-06-19 NOTE — Progress Notes (Signed)
GREENAWAY, [name] ([id number]) [id number] Page 1 of 8 Visit Report for 06/19/2023 Arrival Information Details Patient Name: Date of Service: [name] 06/19/2023 8:00 [name] M Medical Record Number: [id number] Patient Account Number: [id number] Date of Birth/Sex: Treating RN: 1950/11/30 (72 y.o. [name] Primary Care Izreal Kock: [name] Other Clinician: Referring Roxann Vierra: Treating Amorita Vanrossum/Extender: [name] in Treatment: 15 Visit Information History Since Last Visit Added or deleted any medications: No Patient Arrived: Ambulatory Any new allergies or adverse reactions: No Arrival Time: 08:08 Had [name] fall or experienced change in No Accompanied By: self activities of daily living that may affect Transfer Assistance: None risk of falls: Patient Identification Verified: Yes Signs or symptoms of abuse/neglect since last visito No Secondary Verification Process Completed: Yes Hospitalized since last visit: No Patient Requires Transmission-Based Precautions: No Implantable device outside of the clinic excluding No Patient Has Alerts: Yes cellular tissue based products placed in the center Patient Alerts: Patient on Blood Thinner since last visit: Has Dressing in Place as Prescribed: Yes Pain Present Now: No Electronic Signature(s) Signed: 06/19/2023 12:34:12 PM By: [name] RN, BSN Entered By: [name] on 06/19/2023 08:08:41 -------------------------------------------------------------------------------- Complex / Palliative Patient Assessment Details Patient Name: Date of Service: [name], [name] 06/19/2023 8:00 [name] M Medical Record Number: [id number] Patient Account Number: [id number] Date of Birth/Sex: Treating RN: [birth year] (72 y.o. [name] Primary Care Mccartney Brucks: [name] Other Clinician: Referring Verma Grothaus: Treating Lavelle Akel/Extender: [name] in  Treatment: 15 Complex Wound Management Criteria Patient has remarkable or complex co-morbidities requiring medications or treatments that extend wound healing times. Examples: Diabetes mellitus with chronic renal failure or end stage renal disease requiring dialysis Advanced or poorly controlled rheumatoid arthritis Diabetes mellitus and end stage chronic obstructive pulmonary disease Active cancer with current chemo- or radiation therapy osteomyelitis, DM type II, HTN, diabetic retinopathy, prostate Ca, Neuropathy Palliative Wound Management Criteria Care Approach Wound Care Plan: Complex Wound Management Electronic Signature(s) Signed: 06/22/2023 4:45:10 PM By: Shawn Stall RN, BSN Signed: 06/23/2023 7:48:17 AM By: [name] MD FACS Entered By: Shawn Stall on 06/22/2023 16:45:09 [name], [name] ([id number]) [id number] Page 2 of 8 -------------------------------------------------------------------------------- Encounter Discharge Information Details Patient Name: Date of Service: [name] 06/19/2023 8:00 [name] M Medical Record Number: [id number] Patient Account Number: [id number] Date of Birth/Sex: Treating RN: 09-16-1951 (72 y.o. [name] Primary Care Toriana Sponsel: [name] Other Clinician: Referring Landers Prajapati: Treating Jazir Newey/Extender: [name] in Treatment: 15 Encounter Discharge Information Items Post Procedure Vitals Discharge Condition: Stable Temperature (F): 98.4 Ambulatory Status: Ambulatory Pulse (bpm): 79 Discharge Destination: Home Respiratory Rate (breaths/min): 18 Transportation: Private Auto Blood Pressure (mmHg): 214/100 Accompanied By: self Schedule Follow-up Appointment: Yes Clinical Summary of Care: Patient Declined Electronic Signature(s) Signed: 06/19/2023 12:34:12 PM By: [name] RN, BSN Entered By: [name] on 06/19/2023  08:44:26 -------------------------------------------------------------------------------- Lower Extremity Assessment Details Patient Name: Date of Service: [name], [name] 06/19/2023 8:00 [name] M Medical Record Number: [id number] Patient Account Number: [id number] Date of Birth/Sex: Treating RN: 1951/01/31 (72 y.o. [name] Primary Care Jermain Curt: [name] Other Clinician: Referring Joby Richart: Treating Devlin Mcveigh/Extender: [name]: 15 Edema Assessment Assessed: [Left: No] Franne Forts: No] Edema: [Left: N] [Right: o] Calf Left: Right: Point of Measurement: 33 cm From Medial Instep 34.5 cm Ankle Left: Right: Point of Measurement: 10 cm From Medial Instep 21.7 cm Vascular Assessment Pulses: Dorsalis Pedis Palpable: [Right:Yes] Extremity colors, hair growth, and  conditions: Extremity Color: [Right:Normal] Hair Growth on Extremity: [Right:Yes] Temperature of Extremity: [Right:Warm < 3 seconds] Electronic Signature(s) Signed: 06/19/2023 12:34:12 PM By: [name] RN, BSN Entered By: [name] on 06/19/2023 08:09:52 [name], [name] ([id number]) [id number] Page 3 of 8 -------------------------------------------------------------------------------- Multi Wound Chart Details Patient Name: Date of Service: [name] 06/19/2023 8:00 [name] M Medical Record Number: [id number] Patient Account Number: [id number] Date of Birth/Sex: Treating RN: [birth year] (72 y.o. M) Primary Care Augie Vane: [name] Other Clinician: Referring Ceara Wrightson: Treating Herny Scurlock/Extender: [name]: 15 Vital Signs Height(in): 67 Pulse(bpm): 79 Weight(lbs): 154 Blood Pressure(mmHg): 214/100 Body Mass Index(BMI): 24.1 Temperature(F): 98.4 Respiratory Rate(breaths/min): 18 [8:Photos:] [N/[name]:N/[name]] Right T Great oe N/[name] N/[name] Wound Location: Gradually Appeared N/[name] N/[name] Wounding  Event: Diabetic Wound/Ulcer of the Lower N/[name] N/[name] Primary Etiology: Extremity Hypertension, Type II Diabetes, N/[name] N/[name] Comorbid History: Osteoarthritis, Neuropathy, Received Radiation 02/04/2023 N/[name] N/[name] Date Acquired: 15 N/[name] N/[name] Weeks of Treatment: Open N/[name] N/[name] Wound Status: No N/[name] N/[name] Wound Recurrence: 0.2x0.5x0.2 N/[name] N/[name] Measurements L x W x D (cm) 0.079 N/[name] N/[name] [name] (cm) : rea 0.016 N/[name] N/[name] Volume (cm) : 99.30% N/[name] N/[name] % Reduction in [name] rea: 99.30% N/[name] N/[name] % Reduction in Volume: Grade 3 N/[name] N/[name] Classification: Medium N/[name] N/[name] Exudate [name] mount: Serosanguineous N/[name] N/[name] Exudate Type: red, brown N/[name] N/[name] Exudate Color: Distinct, outline attached N/[name] N/[name] Wound Margin: Large (67-100%) N/[name] N/[name] Granulation [name] mount: Red N/[name] N/[name] Granulation Quality: None Present (0%) N/[name] N/[name] Necrotic [name] mount: Fat Layer (Subcutaneous Tissue): Yes N/[name] N/[name] Exposed Structures: Fascia: No Tendon: No Muscle: No Joint: No Bone: No Small (1-33%) N/[name] N/[name] Epithelialization: Debridement - Selective/Open Wound N/[name] N/[name] Debridement: Pre-procedure Verification/Time Out 08:25 N/[name] N/[name] Taken: Callus N/[name] N/[name] Tissue Debrided: Skin/Epidermis N/[name] N/[name] Level: 0.08 N/[name] N/[name] Debridement [name] (sq cm): rea Curette N/[name] N/[name] Instrument: Minimum N/[name] N/[name] Bleeding: Pressure N/[name] N/[name] Hemostasis [name] chieved: 0 N/[name] N/[name] Procedural Pain: 0 N/[name] N/[name] Post Procedural Pain: Procedure was tolerated well N/[name] N/[name] Debridement Treatment Response: 0.2x0.5x0.1 N/[name] N/[name] Post Debridement Measurements L x W x D (cm) [name], [name] ([id number]) [id number] Page 4 of 8 0.008 N/[name] N/[name] Post Debridement Volume: (cm) Callus: Yes N/[name] N/[name] Periwound Skin Texture: Maceration: No N/[name] N/[name] Periwound Skin Moisture: Dry/Scaly: No No Abnormalities Noted N/[name] N/[name] Periwound Skin Color: No Abnormality N/[name] N/[name] Temperature: Cellular or Tissue Based Product N/[name] N/[name] Procedures Performed: Debridement Treatment  Notes Wound #8 (Toe Great) Wound Laterality: Right Cleanser Normal Saline Discharge Instruction: Cleanse the wound with Normal Saline prior to applying [name] clean dressing using gauze sponges, not tissue or cotton balls. Soap and Water Discharge Instruction: May shower and wash wound with dial antibacterial soap and water prior to dressing change. Peri-Wound Care Topical Primary Dressing Secondary Dressing ADAPTIC TOUCH 3x4.25 in Discharge Instruction: Apply over primary dressing as directed. Optifoam Non-Adhesive Dressing, 4x4 in Discharge Instruction: Apply over primary dressing cut to make donut Woven Gauze Sponges 2x2 in Discharge Instruction: Apply over primary dressing as directed. Secured With Conforming Stretch Gauze Bandage, Sterile 2x75 (in/in) Discharge Instruction: Secure with stretch gauze as directed. Compression Wrap Compression Stockings Add-Ons Electronic Signature(s) Signed: 06/19/2023 8:51:37 AM By: [name] MD FACS Entered By: [name] on 06/19/2023 08:51:37 -------------------------------------------------------------------------------- Multi-Disciplinary Care Plan Details Patient Name: Date of Service: [name], [name] 06/19/2023 8:00 [name] M Medical Record Number: [id number] Patient Account Number: [id number] Date of Birth/Sex: Treating RN: [birth year] (72 y.o. [name] Primary Care Lexianna Weinrich:  [name] Other Clinician: Referring Shequilla Goodgame: Treating Destyn Schuyler/Extender: [name] in Treatment: 15 Multidisciplinary Care Plan reviewed with physician Active Inactive HBO Nursing Diagnoses: Anxiety related to knowledge deficit of hyperbaric oxygen therapy and treatment procedures [name], [name] ([id number]) [id number] Page 5 of 8 Goals: Patient will tolerate the hyperbaric oxygen therapy treatment Date Initiated: 03/02/2023 Target Resolution Date: 06/26/2023 Goal Status:  Active Interventions: Assess for signs and symptoms related to adverse events, including but not limited to confinement anxiety, pneumothorax, oxygen toxicity and baurotrauma Notes: Wound/Skin Impairment Nursing Diagnoses: Impaired tissue integrity Knowledge deficit related to ulceration/compromised skin integrity Goals: Patient/caregiver will verbalize understanding of skin care regimen Date Initiated: 03/17/2023 Target Resolution Date: 06/26/2023 Goal Status: Active Ulcer/skin breakdown will have [name] volume reduction of 30% by week 4 Date Initiated: 03/17/2023 Date Inactivated: 04/02/2023 Target Resolution Date: 03/31/2023 Goal Status: Met Ulcer/skin breakdown will have [name] volume reduction of 50% by week 8 Date Initiated: 04/02/2023 Date Inactivated: 05/07/2023 Target Resolution Date: 04/30/2023 Goal Status: Met Interventions: Assess patient/caregiver ability to obtain necessary supplies Assess patient/caregiver ability to perform ulcer/skin care regimen upon admission and as needed Assess ulceration(s) every visit Treatment Activities: Skin care regimen initiated : 03/17/2023 Topical wound management initiated : 03/17/2023 Notes: Electronic Signature(s) Signed: 06/19/2023 12:34:12 PM By: [name] RN, BSN Entered By: [name] on 06/19/2023 08:16:44 -------------------------------------------------------------------------------- Pain Assessment Details Patient Name: Date of Service: [name], [name] 06/19/2023 8:00 [name] M Medical Record Number: [id number] Patient Account Number: [id number] Date of Birth/Sex: Treating RN: 14-Sep-1951 (72 y.o. [name] Primary Care Jayel Inks: [name] Other Clinician: Referring Jayla Mackie: Treating Thuan Tippett/Extender: [name] in Treatment: 15 Active Problems Location of Pain Severity and Description of Pain Patient Has Paino No Site Locations Rate the pain. [name], [name] ([id number])  [id number] Page 6 of 8 Rate the pain. Current Pain Level: 0 Pain Management and Medication Current Pain Management: Electronic Signature(s) Signed: 06/19/2023 12:34:12 PM By: [name] RN, BSN Entered By: [name] on 06/19/2023 08:09:27 -------------------------------------------------------------------------------- Patient/Caregiver Education Details Patient Name: Date of Service: [name], [name] 8/23/2024andnbsp8:00 [name] M Medical Record Number: [id number] Patient Account Number: [id number] Date of Birth/Gender: Treating RN: [birth year] (72 y.o. [name] Primary Care Physician: [name] Other Clinician: Referring Physician: Treating Physician/Extender: [name] in Treatment: 15 Education Assessment Education Provided To: Patient Education Topics Provided Offloading: Methods: Explain/Verbal Responses: Reinforcements needed, State content correctly Wound/Skin Impairment: Methods: Explain/Verbal Responses: Reinforcements needed, State content correctly Electronic Signature(s) Signed: 06/19/2023 12:34:12 PM By: [name] RN, BSN Entered By: [name] on 06/19/2023 08:17:07 -------------------------------------------------------------------------------- Wound Assessment Details Patient Name: Date of Service: [name], [name] 06/19/2023 8:00 [name] [name], [name] ([id number]) [id number] Page 7 of 8 Medical Record Number: [id number] Patient Account Number: [id number] Date of Birth/Sex: Treating RN: 1951/10/18 (72 y.o. [name] Primary Care Lilla Callejo: [name] Other Clinician: Referring Donell Sliwinski: Treating Justice Aguirre/Extender: [name]: 15 Wound Status Wound Number: 8 Primary Diabetic Wound/Ulcer of the Lower Extremity Etiology: Wound Location: Right T Great oe Wound Open Wounding Event: Gradually  Appeared Status: Date Acquired: 02/04/2023 Comorbid Hypertension, Type II Diabetes, Osteoarthritis, Neuropathy, Weeks Of Treatment: 15 History: Received Radiation Clustered Wound: No Photos Wound Measurements Length: (cm) 0.2 Width: (cm) 0.5 Depth: (cm) 0.2 Area: (cm) 0.079 Volume: (cm) 0.016 % Reduction in Area: 99.3% % Reduction in Volume: 99.3% Epithelialization: Small (1-33%) Tunneling: No Undermining: No Wound Description Classification: Grade 3 Wound Margin: Distinct, outline  attached Exudate Amount: Medium Exudate Type: Serosanguineous Exudate Color: red, brown Foul Odor After Cleansing: No Slough/Fibrino Yes Wound Bed Granulation Amount: Large (67-100%) Exposed Structure Granulation Quality: Red Fascia Exposed: No Necrotic Amount: None Present (0%) Fat Layer (Subcutaneous Tissue) Exposed: Yes Tendon Exposed: No Muscle Exposed: No Joint Exposed: No Bone Exposed: No Periwound Skin Texture Texture Color No Abnormalities Noted: No No Abnormalities Noted: Yes Callus: Yes Temperature / Pain Temperature: No Abnormality Moisture No Abnormalities Noted: No Dry / Scaly: No Maceration: No Treatment Notes Wound #8 (Toe Great) Wound Laterality: Right Cleanser Normal Saline Discharge Instruction: Cleanse the wound with Normal Saline prior to applying [name] clean dressing using gauze sponges, not tissue or cotton balls. Soap and Water Discharge Instruction: May shower and wash wound with dial antibacterial soap and water prior to dressing change. Peri-Wound Care Topical [name], [name] ([id number]) [id number] Page 8 of 8 Primary Dressing Secondary Dressing ADAPTIC TOUCH 3x4.25 in Discharge Instruction: Apply over primary dressing as directed. Optifoam Non-Adhesive Dressing, 4x4 in Discharge Instruction: Apply over primary dressing cut to make donut Woven Gauze Sponges 2x2 in Discharge Instruction: Apply over primary dressing as  directed. Secured With Conforming Stretch Gauze Bandage, Sterile 2x75 (in/in) Discharge Instruction: Secure with stretch gauze as directed. Compression Wrap Compression Stockings Add-Ons Electronic Signature(s) Signed: 06/19/2023 12:34:12 PM By: [name] RN, BSN Entered By: [name] on 06/19/2023 08:15:35 -------------------------------------------------------------------------------- Vitals Details Patient Name: Date of Service: [name], [name] 06/19/2023 8:00 [name] M Medical Record Number: [id number] Patient Account Number: [id number] Date of Birth/Sex: Treating RN: 19-Nov-1950 (72 y.o. [name] Primary Care Oddis Westling: [name] Other Clinician: Referring Wauneta Silveria: Treating Cono Gebhard/Extender: [name] in Treatment: 15 Vital Signs Time Taken: 08:08 Temperature (F): 98.4 Height (in): 67 Pulse (bpm): 79 Weight (lbs): 154 Respiratory Rate (breaths/min): 18 Body Mass Index (BMI): 24.1 Blood Pressure (mmHg): 214/100 Reference Range: 80 - 120 mg / dl Electronic Signature(s) Signed: 06/19/2023 12:34:12 PM By: [name] RN, BSN Entered By: [name] on 06/19/2023 08:10:08

## 2023-06-19 NOTE — Progress Notes (Addendum)
[place], Jobe [name] ([id number]) [id number] Page 1 of 2 Visit Report for 06/19/2023 Arrival Information Details Patient Name: Date of Service: [name] 06/19/2023 9:30 A M Medical Record Number: [id number] Patient Account Number: [id number] Date of Birth/Sex: Treating RN: [birth year] (73 y.o. [name] Primary Care [name]: [name] Other Clinician: Karl [name] Referring Calissa Swenor: Treating [name]/Extender: [name] in [name]: 15 Visit Information History Since Last Visit All ordered tests and consults were completed: Yes Patient Arrived: Ambulatory Added or deleted any medications: No Arrival Time: 08:00 Any new allergies or adverse reactions: No Accompanied By: None Had a fall or experienced change in No Transfer Assistance: None activities of daily living that may affect Patient Identification Verified: Yes risk of falls: Secondary Verification Process Completed: Yes Signs or symptoms of abuse/neglect since last visito No Patient Requires Transmission-Based Precautions: No Hospitalized since last visit: No Patient Has Alerts: Yes Implantable device outside of the clinic excluding No Patient Alerts: Patient on Blood Thinner cellular tissue based products placed in the center since last visit: Pain Present Now: No Electronic Signature(s) Signed: 06/19/2023 11:42:46 AM By: [name] EMT Entered By: [name] on 06/19/2023 08:42:46 -------------------------------------------------------------------------------- Encounter Discharge Information Details Patient Name: Date of Service: [name] 06/19/2023 9:30 A M Medical Record Number: [id number] Patient Account Number: [id number] Date of Birth/Sex: Treating RN: 1951/07/14 (72 y.o. [name] Primary Care [name]: [name] Other Clinician: Karl [name] Referring [name]: Treating [name] Gora/Extender:  [name] in [name]: 15 Encounter Discharge Information Items Discharge Condition: Stable Ambulatory Status: Ambulatory Discharge Destination: Home Transportation: Private Auto Accompanied By: None Schedule Follow-up Appointment: Yes Clinical Summary of Care: Electronic Signature(s) Signed: 06/19/2023 11:57:30 AM By: [name] EMT Entered By: [name] on 06/19/2023 08:57:30 [name] ([id number]) [id number] Page 2 of 2 -------------------------------------------------------------------------------- Vitals Details Patient Name: Date of Service: [name] 06/19/2023 9:30 A M Medical Record Number: [id number] Patient Account Number: [id number] Date of Birth/Sex: Treating RN: 04-25-1951 (72 y.o. [name] Primary Care Jermery Caratachea: [name] Other Clinician: Karl [name] Referring Staci Dack: Treating [name]: [name]: 15 Vital Signs Time Taken: 08:14 Capillary Blood Glucose (mg/dl): 694 Height (in): 67 Reference Range: 80 - 120 mg / dl Weight (lbs): 854 Body Mass Index (BMI): 24.1 Electronic Signature(s) Signed: 06/19/2023 11:43:05 AM By: [name] EMT Entered By: [name] on 06/19/2023 08:43:05

## 2023-06-19 NOTE — Progress Notes (Signed)
Artois, [name] ([id number]) [id number] Page 1 of 2 Visit Report for 06/19/2023 Problem List Details Patient Name: Date of Service: [name] 06/19/2023 9:30 A M Medical Record Number: [id number] Patient Account Number: [id number] Date of Birth/Sex: Treating RN: 1951-07-05 (72 y.o. 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 No Yes necrosis of bone M86.171 Other acute osteomyelitis, right ankle and foot 03/02/2023 No Yes E11.621 Type 2 diabetes mellitus with foot ulcer 03/02/2023 No Yes I10 Essential (primary) hypertension 03/02/2023 No Yes N18.32 Chronic kidney disease, stage 3b 03/02/2023 No Yes Inactive Problems Resolved Problems ICD-10 Code Description Active Date Resolved Date L97.512 Non-pressure chronic ulcer of other part of right foot with fat layer 03/09/2023 03/09/2023 exposed Electronic Signature(s) Signed: 06/19/2023 11:52:55 AM By: [name] EMT Signed: 06/19/2023 12:35:56 PM By: [name] MD FACS Entered By: [name] on 06/19/2023 08:52:55 -------------------------------------------------------------------------------- SuperBill Details Patient Name: Date of Service: [name], [name] 06/19/2023 Medical Record Number: [id number] Patient Account Number: [id number] Date of Birth/Sex: Treating RN: 11-27-1950 (72 y.o. [name] [place], [place]ail ([id number]) [id number] Page 2 of 2 Primary Care Provider: [name] Other Clinician: [name] Referring Provider: Treating Provider/Extender: [name] in Treatment: 15 Diagnosis Coding ICD-10  Codes Code Description [phone] Non-pressure chronic ulcer of other part of right foot with necrosis of bone M86.171 Other acute osteomyelitis, right ankle and foot E11.621 Type 2 diabetes mellitus with foot ulcer I10 Essential (primary) hypertension N18.32 Chronic kidney disease, stage 3b Facility Procedures : CPT4 Code Description: [id number] G0277-(Facility Use Only) HBOT full body chamber, , ICD-10 Diagnosis Description E11.621 Type 2 diabetes mellitus with foot ulcer L97.514 Non-pressure chronic ulcer of other part of right foot wi M86.171 Other acute  osteomyelitis, right ankle and foot Modifier: th necrosis o Quantity: 4 f bone Physician Procedures : CPT4 Code Description Modifier [id number] - WC PHYS HYPERBARIC OXYGEN THERAPY ICD-10 Diagnosis Description E11.621 Type 2 diabetes mellitus with foot ulcer L97.514 Non-pressure chronic ulcer of other part of right foot with necrosis o M86.171 Other  acute osteomyelitis, right ankle and foot Quantity: 1 f bone Electronic Signature(s) Signed: 06/19/2023 11:52:51 AM By: [name] EMT Signed: 06/19/2023 12:35:56 PM By: [name] MD FACS Entered By: [name] on 06/19/2023 08:52:50

## 2023-06-22 ENCOUNTER — Encounter (HOSPITAL_BASED_OUTPATIENT_CLINIC_OR_DEPARTMENT_OTHER): Payer: Medicare HMO | Admitting: General Surgery

## 2023-06-22 DIAGNOSIS — N1832 Chronic kidney disease, stage 3b: Secondary | ICD-10-CM | POA: Diagnosis not present

## 2023-06-22 DIAGNOSIS — M1990 Unspecified osteoarthritis, unspecified site: Secondary | ICD-10-CM | POA: Diagnosis not present

## 2023-06-22 DIAGNOSIS — I129 Hypertensive chronic kidney disease with stage 1 through stage 4 chronic kidney disease, or unspecified chronic kidney disease: Secondary | ICD-10-CM | POA: Diagnosis not present

## 2023-06-22 DIAGNOSIS — Z833 Family history of diabetes mellitus: Secondary | ICD-10-CM | POA: Diagnosis not present

## 2023-06-22 DIAGNOSIS — Z8546 Personal history of malignant neoplasm of prostate: Secondary | ICD-10-CM | POA: Diagnosis not present

## 2023-06-22 DIAGNOSIS — M86171 Other acute osteomyelitis, right ankle and foot: Secondary | ICD-10-CM | POA: Diagnosis not present

## 2023-06-22 DIAGNOSIS — E11621 Type 2 diabetes mellitus with foot ulcer: Secondary | ICD-10-CM | POA: Diagnosis not present

## 2023-06-22 DIAGNOSIS — E1122 Type 2 diabetes mellitus with diabetic chronic kidney disease: Secondary | ICD-10-CM | POA: Diagnosis not present

## 2023-06-22 DIAGNOSIS — L97514 Non-pressure chronic ulcer of other part of right foot with necrosis of bone: Secondary | ICD-10-CM | POA: Diagnosis not present

## 2023-06-22 DIAGNOSIS — E11319 Type 2 diabetes mellitus with unspecified diabetic retinopathy without macular edema: Secondary | ICD-10-CM | POA: Diagnosis not present

## 2023-06-22 LAB — GLUCOSE, CAPILLARY: Glucose-Capillary: 232 mg/dL — ABNORMAL HIGH (ref 70–99)

## 2023-06-22 NOTE — Progress Notes (Addendum)
[place], [name] ([id number]) [id number] Page 1 of 2 Visit Report for 06/22/2023 HBO Details Patient Name: Date of Service: [name] 06/22/2023 10:00 A M Medical Record Number: [id number] Patient Account Number: [id number] Date of Birth/Sex: Treating RN: May 14, 1951 (72 y.o. [name] Primary Care Jeronica Stlouis: [name] Other Clinician: Haywood Pao Referring Lynetta Tomczak: Treating Hooper Petteway/Extender: [name] in Treatment: 16 HBO Treatment Course Details Treatment Course Number: 1 Ordering Jerimiah Wolman: [name] T Treatments Ordered: otal 40 HBO Treatment Start 04/06/2023 Date: HBO Indication: Diabetic Ulcer(s) of the Lower Extremity HBO Treatment End 06/22/2023 Date: Standard/Conservative Wound Care tried and failed greater than or equal to 30 days HBO Discharge Treatment Series Complete; Improved Wound Outcome: Perfusion Wound #8 Right T [place] HBO Treatment Details Treatment Number: 40 Patient Type: Outpatient Chamber Type: Monoplace Chamber Serial #: [id number] Treatment Protocol: 2.5 ATA with 90 minutes oxygen, with two 5 minute air breaks Treatment Details Compression Rate Down: 1.5 psi / minute De-Compression Rate Up: 2.0 psi / minute A breaks and breathing ir Compress Tx Pressure periods Decompress Decompress Begins Reached (leave unused spaces Begins Ends blank) Chamber Pressure (ATA 1 2.5 2.5 2.5 2.5 2.5 - - 2.5 1 ) Clock Time (24 hr) 10:58 11:11 11:41 11:46 12:16 12:21 - - 12:51 13:03 Treatment Length: 125 (minutes) Treatment Segments: 4 Vital Signs Capillary Blood Glucose Reference Range: 80 - 120 mg / dl HBO Diabetic Blood Glucose Intervention Range: <131 mg/dl or >324 mg/dl Type: Time Vitals Blood Respiratory Capillary Blood Glucose Pulse Action Pulse: Temperature: Taken: Pressure: Rate: Glucose (mg/dl): Meter #: Oximetry (%) Taken: Pre 10:32 179/89 68 20 98 232 none per  protocol Post 13:07 192/98 76 18 98 217 none per protocol Treatment Response Treatment Toleration: Well Treatment Completion Status: Treatment Completed without Adverse Event Treatment Notes Mr. Kasim arrived for treatment with vital signs within normal range per protocol. He ate breakfast and self-administered insulin yesterday. He prepared for treatment. He was given an 8 oz Glucerna for safety per orders. After performing a safety check, patient was placed in the chamber which was compressed with 100% oxygen at the rate of 2 psi/min after confirming normal ear equalization. He tolerated the treatment and subsequent decompression at the rate of 2 psi/min. He denied any issues with ear equalization and pain. Post-treatment vital signs were within normal range except systolic BP at 192 mmHg. Educated patient of the need to control blood pressure. Patient complained of blurry vision prior to preparing for departure. I did follow up with patient prior to leaving for a manual BP result of 142/74 mmHg. He stated that his eyes were no longer blurry and ready to drive. He was stable upon discharge. Additional Procedure Documentation Tissue Sevierity: Necrosis of bone Physician HBO Attestation: I certify that I supervised this HBO treatment in accordance with Medicare guidelines. A trained emergency response team is readily available per Yes hospital policies and procedures. Continue HBOT as ordered. Yes Electronic Signature(s) [place], [name] ([id number]) [id number] Page 2 of 2 Signed: 06/22/2023 3:47:42 PM By: [name] MD FACS Previous Signature: 06/22/2023 1:44:49 PM Version By: Haywood Pao CHT EMT BS , , Previous Signature: 06/22/2023 1:42:57 PM Version By: Haywood Pao CHT EMT BS , , Previous Signature: 06/22/2023 12:57:05 PM Version By: Haywood Pao CHT EMT BS , , Entered By: [name] on 06/22/2023  12:47:42 -------------------------------------------------------------------------------- HBO Safety Checklist Details Patient Name: Date of Service: [name], [name] 06/22/2023 10:00 A M Medical Record Number: [id number] Patient Account  Number: [id number] Date of Birth/Sex: Treating RN: [birth year] (72 y.o. [name] Primary Care Jeromie Gainor: [name] Other Clinician: Haywood Pao Referring Kissa Campoy: Treating Shivansh Hardaway/Extender: [name] in Treatment: 16 HBO Safety Checklist Items Safety Checklist Consent Form Signed Patient voided / foley secured and emptied When did you last eato 0700 - Sausage Egg Cheese Biscuit Last dose of injectable or oral agent Yesterday Ostomy pouch emptied and vented if applicable All implantable devices assessed, documented and approved Intravenous access site secured and place Valuables secured Linens and cotton and cotton/polyester blend (less than 51% polyester) Personal oil-based products / skin lotions / body lotions removed Wigs or hairpieces removed NA Smoking or tobacco materials removed NA Books / newspapers / magazines / loose paper removed Cologne, aftershave, perfume and deodorant removed Jewelry removed (may wrap wedding band) Make-up removed NA Hair care products removed Battery operated devices (external) removed Heating patches and chemical warmers removed Titanium eyewear removed Nail polish cured greater than 10 hours Casting material cured greater than 10 hours NA Hearing aids removed NA Loose dentures or partials removed dentures removed Prosthetics have been removed NA Patient demonstrates correct use of air break device (if applicable) Patient concerns have been addressed Patient grounding bracelet on and cord attached to chamber Specifics for Inpatients (complete in addition to above) Medication sheet sent with patient NA Intravenous medications needed or due during  therapy sent with patient NA Drainage tubes (e.g. nasogastric tube or chest tube secured and vented) NA Endotracheal or Tracheotomy tube secured NA Cuff deflated of air and inflated with saline NA Airway suctioned NA Notes Paper version used prior to treatment start. Electronic Signature(s) Signed: 06/22/2023 12:44:35 PM By: Haywood Pao CHT EMT BS , , Entered By: Haywood Pao on 06/22/2023 09:44:34

## 2023-06-22 NOTE — Progress Notes (Addendum)
[place], [name] ([id number]) [id number] Page 1 of 2 Visit Report for 06/22/2023 Arrival Information Details Patient Name: Date of Service: [name] 06/22/2023 10:00 A M Medical Record Number: [id number] Patient Account Number: [id number] Date of Birth/Sex: Treating RN: September 11, 1951 (72 y.o. [name] Primary Care Soriah Leeman: [name] Other Clinician: Haywood Pao Referring Samariah Hokenson: Treating Claudell Rhody/Extender: [name] in Treatment: 16 Visit Information History Since Last Visit All ordered tests and consults were completed: Yes Patient Arrived: Ambulatory Added or deleted any medications: No Arrival Time: 10:26 Any new allergies or adverse reactions: No Accompanied By: self Had a fall or experienced change in No Transfer Assistance: None activities of daily living that may affect Patient Identification Verified: Yes risk of falls: Secondary Verification Process Completed: Yes Signs or symptoms of abuse/neglect since last visito No Patient Requires Transmission-Based Precautions: No Hospitalized since last visit: No Patient Has Alerts: Yes Implantable device outside of the clinic excluding No Patient Alerts: Patient on Blood Thinner cellular tissue based products placed in the center since last visit: Pain Present Now: No Electronic Signature(s) Signed: 06/22/2023 12:41:42 PM By: Haywood Pao CHT EMT BS , , Entered By: Haywood Pao on 06/22/2023 12:41:42 -------------------------------------------------------------------------------- Encounter Discharge Information Details Patient Name: Date of Service: [name] RD, [name] 06/22/2023 10:00 A M Medical Record Number: [id number] Patient Account Number: [id number] Date of Birth/Sex: Treating RN: 10-05-1951 (72 y.o. [name] Primary Care Savita Runner: [name] Other Clinician: Haywood Pao Referring Destony Prevost: Treating  Shantai Tiedeman/Extender: [name] in Treatment: 16 Encounter Discharge Information Items Discharge Condition: Stable Ambulatory Status: Ambulatory Discharge Destination: Home Transportation: Private Auto Accompanied By: self Schedule Follow-up Appointment: No Clinical Summary of Care: Electronic Signature(s) Signed: 06/22/2023 1:45:47 PM By: Haywood Pao CHT EMT BS , , Entered By: Haywood Pao on 06/22/2023 13:45:47 [place], Kar[place] ([id number]) [id number] Page 2 of 2 -------------------------------------------------------------------------------- Vitals Details Patient Name: Date of Service: [name] 06/22/2023 10:00 A M Medical Record Number: [id number] Patient Account Number: [id number] Date of Birth/Sex: Treating RN: 09-15-1951 (72 y.o. [name] Primary Care Niobe Dick: [name] Other Clinician: Haywood Pao Referring Naiyana Barbian: Treating Kylani Wires/Extender: [name] in Treatment: 16 Vital Signs Time Taken: 10:32 Temperature (F): 98.0 Height (in): 67 Pulse (bpm): 68 Weight (lbs): 154 Respiratory Rate (breaths/min): 20 Body Mass Index (BMI): 24.1 Blood Pressure (mmHg): 179/89 Capillary Blood Glucose (mg/dl): 967 Reference Range: 80 - 120 mg / dl Electronic Signature(s) Signed: 06/22/2023 12:42:31 PM By: Haywood Pao CHT EMT BS , , Entered By: Haywood Pao on 06/22/2023 12:42:31

## 2023-06-22 NOTE — Progress Notes (Signed)
[place], [name] ([id number]) [id number] Page 1 of 1 Visit Report for 06/22/2023 SuperBill Details Patient Name: Date of Service: [name] 06/22/2023 Medical Record Number: [id number] Patient Account Number: [id number] Date of Birth/Sex: Treating RN: June 03, 1951 (72 y.o. [name] Primary Care Provider: [name] Other Clinician: Haywood Pao Referring Provider: Treating Provider/Extender: [name] in Treatment: 16 Diagnosis Coding ICD-10 Codes Code Description [phone] Non-pressure chronic ulcer of other part of right foot with necrosis of bone M86.171 Other acute osteomyelitis, right ankle and foot E11.621 Type 2 diabetes mellitus with foot ulcer I10 Essential (primary) hypertension N18.32 Chronic kidney disease, stage 3b Facility Procedures CPT4 Code Description Modifier Quantity [id number] G0277-(Facility Use Only) HBOT full body chamber, , 4 ICD-10 Diagnosis Description E11.621 Type 2 diabetes mellitus with foot ulcer L97.514 Non-pressure chronic ulcer of other part of right foot with necrosis of bone M86.171 Other acute osteomyelitis, right ankle and foot Physician Procedures Quantity CPT4 Code Description Modifier [id number] - WC PHYS HYPERBARIC OXYGEN THERAPY 1 ICD-10 Diagnosis Description E11.621 Type 2 diabetes mellitus with foot ulcer L97.514 Non-pressure chronic ulcer of other part of right foot with necrosis of bone M86.171 Other acute osteomyelitis, right ankle and foot Electronic Signature(s) Signed: 06/22/2023 1:45:19 PM By: Haywood Pao CHT EMT BS , , Signed: 06/22/2023 3:47:03 PM By: [name] MD FACS Entered By: Haywood Pao on 06/22/2023 10:45:18

## 2023-06-23 LAB — GLUCOSE, CAPILLARY: Glucose-Capillary: 217 mg/dL — ABNORMAL HIGH (ref 70–99)

## 2023-06-23 NOTE — Progress Notes (Signed)
DVONTE, STERKEL ([id number]) [id number] Page 1 of 7 Visit Report for 06/12/2023 Arrival Information Details Patient Name: Date of Service: [name] 06/12/2023 10:00 [name] M Medical Record Number: [id number] Patient Account Number: [id number] Date of Birth/Sex: Treating RN: [name] (72 y.o. M) Primary Care [name]: [name] Other Clinician: Referring [name]: Treating [name]/Extender: [name] in Treatment: 14 Visit Information History Since Last Visit All ordered tests and consults were completed: No Patient Arrived: Ambulatory Added or deleted any medications: No Arrival Time: 09:41 Any new allergies or adverse reactions: No Accompanied By: self Had [name] fall or experienced change in No Transfer Assistance: None activities of daily living that may affect Patient Identification Verified: Yes risk of falls: Secondary Verification Process Completed: Yes Signs or symptoms of abuse/neglect since last visito No Patient Requires Transmission-Based Precautions: No Hospitalized since last visit: No Patient Has Alerts: Yes Implantable device outside of the clinic excluding No Patient Alerts: Patient on Blood Thinner cellular tissue based products placed in the center since last visit: Pain Present Now: No Electronic Signature(s) Signed: 06/16/2023 8:17:59 AM By: [name] Entered By: [name] on 06/12/2023 09:41:27 -------------------------------------------------------------------------------- Encounter Discharge Information Details Patient Name: Date of Service: [name], [name] 06/12/2023 10:00 [name] M Medical Record Number: [id number] Patient Account Number: [id number] Date of Birth/Sex: Treating RN: [name] (72 y.o. [name] Primary Care Valery Chance: [name] Other Clinician: Referring [name]: Treating Timiko Offutt/Extender: [name] in Treatment: 14 Encounter  Discharge Information Items Post Procedure Vitals Discharge Condition: Stable Temperature (F): 98 Ambulatory Status: Ambulatory Pulse (bpm): 78 Discharge Destination: Home Respiratory Rate (breaths/min): 18 Transportation: Private Auto Blood Pressure (mmHg): 207/107 Accompanied By: self Schedule Follow-up Appointment: Yes Clinical Summary of Care: Patient Declined Electronic Signature(s) Signed: 06/23/2023 2:03:55 PM By: [name] Entered By: [name] on 06/12/2023 10:24:08 [name] ([id number]) [id number] Page 2 of 7 -------------------------------------------------------------------------------- Lower Extremity Assessment Details Patient Name: Date of Service: [name] 06/12/2023 10:00 [name] M Medical Record Number: [id number] Patient Account Number: [id number] Date of Birth/Sex: Treating RN: [name] (72 y.o. [name] Primary Care [name]: [name] Other Clinician: Referring [name]: Treating [name]/Extender: [name] Weeks in Treatment: 14 Edema Assessment Assessed: [name]: No] [name]: No] Edema: [Left: N] [Right: o] Calf Left: Right: Point of Measurement: 33 cm From Medial Instep 34.5 cm Ankle Left: Right: Point of Measurement: 10 cm From Medial Instep 21.7 cm Vascular Assessment Pulses: Dorsalis Pedis Palpable: [Right:Yes] Extremity colors, hair growth, and conditions: Extremity Color: [Right:Normal] Hair Growth on Extremity: [Right:Yes] Temperature of Extremity: [Right:Warm < 3 seconds] Toe Nail Assessment Left: Right: Thick: No Discolored: No Deformed: No Improper Length and Hygiene: No Electronic Signature(s) Signed: 06/23/2023 2:03:55 PM By: [name] Entered By: [name] on 06/12/2023 09:50:33 -------------------------------------------------------------------------------- Multi Wound Chart Details Patient Name: Date of Service: [name], [name]  06/12/2023 10:00 [name] M Medical Record Number: [id number] Patient Account Number: [id number] Date of Birth/Sex: Treating RN: [name] (72 y.o. M) Primary Care [name]: [name] Other Clinician: Referring [name]: Treating [name]/Extender: [name] Weeks in Treatment: 14 Vital Signs Height(in): 67 Capillary Blood Glucose(mg/dl): 315 Weight(lbs): 176 Pulse(bpm): 81 Body Mass Index(BMI): 24.1 Blood Pressure(mmHg): 221/107 Temperature(F): 97.9 Respiratory Rate(breaths/min): 18 [name], [name] ([id number]) [id number] Page 3 of 7 [8:Photos:] [N/[name]:N/[name]] Right T Great oe N/[name] N/[name] Wound Location: Gradually Appeared N/[name] N/[name] Wounding Event: Diabetic Wound/Ulcer of the Lower N/[name] N/[name] Primary Etiology: Extremity Hypertension, Type II Diabetes, N/[name] N/[name] Comorbid History:  Osteoarthritis, Neuropathy, Received Radiation 02/04/2023 N/[name] N/[name] Date Acquired: 14 N/[name] N/[name] Weeks of Treatment: Open N/[name] N/[name] Wound Status: No N/[name] N/[name] Wound Recurrence: 0.3x0.5x0.1 N/[name] N/[name] Measurements L x W x D (cm) 0.118 N/[name] N/[name] [name] (cm) : rea 0.012 N/[name] N/[name] Volume (cm) : 99.00% N/[name] N/[name] % Reduction in [name] rea: 99.50% N/[name] N/[name] % Reduction in Volume: Grade 3 N/[name] N/[name] Classification: Medium N/[name] N/[name] Exudate [name] mount: Serosanguineous N/[name] N/[name] Exudate Type: red, brown N/[name] N/[name] Exudate Color: Distinct, outline attached N/[name] N/[name] Wound Margin: Large (67-100%) N/[name] N/[name] Granulation [name] mount: Red, Pale N/[name] N/[name] Granulation Quality: None Present (0%) N/[name] N/[name] Necrotic [name] mount: Fat Layer (Subcutaneous Tissue): Yes N/[name] N/[name] Exposed Structures: Fascia: No Tendon: No Muscle: No Joint: No Bone: No Small (1-33%) N/[name] N/[name] Epithelialization: Debridement - Selective/Open Wound N/[name] N/[name] Debridement: Pre-procedure Verification/Time Out 10:06 N/[name] N/[name] Taken: Lidocaine 4% Topical Solution N/[name] N/[name] Pain Control: Necrotic/Eschar, Callus N/[name] N/[name] Tissue Debrided: Non-Viable  Tissue N/[name] N/[name] Level: 0.12 N/[name] N/[name] Debridement [name] (sq cm): rea Curette N/[name] N/[name] Instrument: Minimum N/[name] N/[name] Bleeding: Pressure N/[name] N/[name] Hemostasis [name] chieved: 0 N/[name] N/[name] Procedural Pain: 0 N/[name] N/[name] Post Procedural Pain: Procedure was tolerated well N/[name] N/[name] Debridement Treatment Response: 0.3x0.5x0.1 N/[name] N/[name] Post Debridement Measurements L x W x D (cm) 0.012 N/[name] N/[name] Post Debridement Volume: (cm) Callus: Yes N/[name] N/[name] Periwound Skin Texture: Maceration: No N/[name] N/[name] Periwound Skin Moisture: Dry/Scaly: No No Abnormalities Noted N/[name] N/[name] Periwound Skin Color: No Abnormality N/[name] N/[name] Temperature: Cellular or Tissue Based Product N/[name] N/[name] Procedures Performed: Debridement Treatment Notes Wound #8 (Toe Great) Wound Laterality: Right Cleanser Normal Saline Discharge Instruction: Cleanse the wound with Normal Saline prior to applying [name] clean dressing using gauze sponges, not tissue or cotton balls. Soap and Water Discharge Instruction: May shower and wash wound with dial antibacterial soap and water prior to dressing change. Peri-Wound Care Topical [name], [name] ([id number]) [id number] Page 4 of 7 Primary Dressing Secondary Dressing ADAPTIC TOUCH 3x4.25 in Discharge Instruction: Apply over primary dressing as directed. Woven Gauze Sponges 2x2 in Discharge Instruction: Apply over primary dressing as directed. Secured With Conforming Stretch Gauze Bandage, Sterile 2x75 (in/in) Discharge Instruction: Secure with stretch gauze as directed. Compression Wrap Compression Stockings Add-Ons Electronic Signature(s) Signed: 06/12/2023 10:45:12 AM By: [name] MD FACS Entered By: [name] on 06/12/2023 10:45:11 -------------------------------------------------------------------------------- Multi-Disciplinary Care Plan Details Patient Name: Date of Service: [name], [name] 06/12/2023 10:00 [name] M Medical Record Number: [id number] Patient Account  Number: [id number] Date of Birth/Sex: Treating RN: [birth year] [name] (72 y.o. [name] Primary Care Carol Theys: [name] Other Clinician: Referring Demaryius Imran: Treating Calie Buttrey/Extender: [name] in Treatment: 14 Multidisciplinary Care Plan reviewed with physician Active Inactive HBO Nursing Diagnoses: Anxiety related to knowledge deficit of hyperbaric oxygen therapy and treatment procedures Goals: Patient will tolerate the hyperbaric oxygen therapy treatment Date Initiated: 03/02/2023 Target Resolution Date: 06/26/2023 Goal Status: Active Interventions: Assess for signs and symptoms related to adverse events, including but not limited to confinement anxiety, pneumothorax, oxygen toxicity and baurotrauma Notes: Wound/Skin Impairment Nursing Diagnoses: Impaired tissue integrity Knowledge deficit related to ulceration/compromised skin integrity Goals: Patient/caregiver will verbalize understanding of skin care regimen Date Initiated: 03/17/2023 Target Resolution Date: 06/26/2023 Goal Status: Active Ulcer/skin breakdown will have [name] volume reduction of 30% by week 4 Date Initiated: 03/17/2023 Date Inactivated: 04/02/2023 Target Resolution Date: 03/31/2023 Goal Status: Met Ulcer/skin breakdown will have [name] volume reduction of 50% by week 8 Date Initiated: 04/02/2023  Date Inactivated: 05/07/2023 Target Resolution Date: 04/30/2023 [name], [name] ([id number]) [id number] Page 5 of 7 Goal Status: Met Interventions: Assess patient/caregiver ability to obtain necessary supplies Assess patient/caregiver ability to perform ulcer/skin care regimen upon admission and as needed Assess ulceration(s) every visit Treatment Activities: Skin care regimen initiated : 03/17/2023 Topical wound management initiated : 03/17/2023 Notes: Electronic Signature(s) Signed: 06/23/2023 2:03:55 PM By: [name] Entered By: [name] on 06/12/2023  10:00:06 -------------------------------------------------------------------------------- Pain Assessment Details Patient Name: Date of Service: [name], [name] 06/12/2023 10:00 [name] M Medical Record Number: [id number] Patient Account Number: [id number] Date of Birth/Sex: Treating RN: [birth year] (72 y.o. M) Primary Care Falynn Ailey: [name] Other Clinician: Referring Jaslene Marsteller: Treating Hanaan Gancarz/Extender: [name] Weeks in Treatment: 14 Active Problems Location of Pain Severity and Description of Pain Patient Has Paino No Site Locations Pain Management and Medication Current Pain Management: Electronic Signature(s) Signed: 06/16/2023 8:17:59 AM By: [name] Entered By: [name] on 06/12/2023 09:41:59 Patient/Caregiver Education Details -------------------------------------------------------------------------------- [name] ([id number]) [id number] Page 6 of 7 Patient Name: Date of Service: [name] 8/16/2024andnbsp10:00 [name] M Medical Record Number: [id number] Patient Account Number: [id number] Date of Birth/Gender: Treating RN: [birth year] [name] (72 y.o. [name] Primary Care Physician: [name] Other Clinician: Referring Physician: Treating Physician/Extender: [name] in Treatment: 14 Education Assessment Education Provided To: Patient Education Topics Provided Wound/Skin Impairment: Methods: Explain/Verbal Responses: State content correctly Electronic Signature(s) Signed: 06/23/2023 2:03:55 PM By: [name] Entered By: [name] on 06/12/2023 10:00:26 -------------------------------------------------------------------------------- Wound Assessment Details Patient Name: Date of Service: [name], [name] 06/12/2023 10:00 [name] M Medical Record Number: [id number] Patient Account Number: [id number] Date of Birth/Sex: Treating RN: [birth year] (72 y.o.  M) Primary Care Dana Debo: [name] Other Clinician: Referring Catalina Salasar: Treating Advith Martine/Extender: [name] Weeks in Treatment: 14 Wound Status Wound Number: 8 Primary Diabetic Wound/Ulcer of the Lower Extremity Etiology: Wound Location: Right T Great oe Wound Open Wounding Event: Gradually Appeared Status: Date Acquired: 02/04/2023 Comorbid Hypertension, Type II Diabetes, Osteoarthritis, Neuropathy, Weeks Of Treatment: 14 History: Received Radiation Clustered Wound: No Photos Wound Measurements Length: (cm) 0.3 Width: (cm) 0.5 Depth: (cm) 0.1 Area: (cm) 0.118 Volume: (cm) 0.012 % Reduction in Area: 99% % Reduction in Volume: 99.5% Epithelialization: Small (1-33%) Wound Description Classification: Grade 3 Wound Margin: Distinct, outline attached Exudate Amount: Medium [name], [name] ([id number]) Exudate Type: Serosanguineous Exudate Color: red, brown Foul Odor After Cleansing: No Slough/Fibrino Yes [phone].pdf Page 7 of 7 Wound Bed Granulation Amount: Large (67-100%) Exposed Structure Granulation Quality: Red, Pale Fascia Exposed: No Necrotic Amount: None Present (0%) Fat Layer (Subcutaneous Tissue) Exposed: Yes Tendon Exposed: No Muscle Exposed: No Joint Exposed: No Bone Exposed: No Periwound Skin Texture Texture Color No Abnormalities Noted: No No Abnormalities Noted: Yes Callus: Yes Temperature / Pain Temperature: No Abnormality Moisture No Abnormalities Noted: No Dry / Scaly: No Maceration: No Electronic Signature(s) Signed: 06/16/2023 8:17:59 AM By: [name] Entered By: [name] on 06/12/2023 09:46:30 -------------------------------------------------------------------------------- Vitals Details Patient Name: Date of Service: [name], [name] 06/12/2023 10:00 [name] M Medical Record Number: [id number] Patient Account Number: [id number] Date of Birth/Sex: Treating RN: [birth year] [name] (72 y.o.  M) Primary Care Catarina Huntley: [name] Other Clinician: Referring Olia Hinderliter: Treating Michalene Debruler/Extender: [name] in Treatment: 14 Vital Signs Time Taken: 09:41 Temperature (F): 97.9 Height (in): 67 Pulse (bpm): 81 Weight (lbs): 154 Respiratory Rate (breaths/min): 18 Body Mass Index (BMI): 24.1 Blood Pressure (mmHg): 221/107 Capillary Blood Glucose (  mg/dl): 960 Reference Range: 80 - 120 mg / dl Electronic Signature(s) Signed: 06/16/2023 8:17:59 AM By: [name] Entered By: [name] on 06/12/2023 09:41:51

## 2023-06-25 NOTE — Addendum Note (Signed)
Addended by: Elwin Mocha on: 06/25/2023 01:16 PM   Modules accepted: Orders

## 2023-06-25 NOTE — Telephone Encounter (Signed)
Okay for referral?

## 2023-06-25 NOTE — Telephone Encounter (Signed)
Referral placed.

## 2023-06-26 ENCOUNTER — Encounter (HOSPITAL_BASED_OUTPATIENT_CLINIC_OR_DEPARTMENT_OTHER): Payer: Medicare HMO | Admitting: General Surgery

## 2023-06-26 DIAGNOSIS — I129 Hypertensive chronic kidney disease with stage 1 through stage 4 chronic kidney disease, or unspecified chronic kidney disease: Secondary | ICD-10-CM | POA: Diagnosis not present

## 2023-06-26 DIAGNOSIS — M1990 Unspecified osteoarthritis, unspecified site: Secondary | ICD-10-CM | POA: Diagnosis not present

## 2023-06-26 DIAGNOSIS — E11621 Type 2 diabetes mellitus with foot ulcer: Secondary | ICD-10-CM | POA: Diagnosis not present

## 2023-06-26 DIAGNOSIS — L97512 Non-pressure chronic ulcer of other part of right foot with fat layer exposed: Secondary | ICD-10-CM | POA: Diagnosis not present

## 2023-06-26 DIAGNOSIS — L97514 Non-pressure chronic ulcer of other part of right foot with necrosis of bone: Secondary | ICD-10-CM | POA: Diagnosis not present

## 2023-06-26 DIAGNOSIS — Z833 Family history of diabetes mellitus: Secondary | ICD-10-CM | POA: Diagnosis not present

## 2023-06-26 DIAGNOSIS — M86171 Other acute osteomyelitis, right ankle and foot: Secondary | ICD-10-CM | POA: Diagnosis not present

## 2023-06-26 DIAGNOSIS — Z8546 Personal history of malignant neoplasm of prostate: Secondary | ICD-10-CM | POA: Diagnosis not present

## 2023-06-26 DIAGNOSIS — E1122 Type 2 diabetes mellitus with diabetic chronic kidney disease: Secondary | ICD-10-CM | POA: Diagnosis not present

## 2023-06-26 DIAGNOSIS — E11319 Type 2 diabetes mellitus with unspecified diabetic retinopathy without macular edema: Secondary | ICD-10-CM | POA: Diagnosis not present

## 2023-06-26 DIAGNOSIS — N1832 Chronic kidney disease, stage 3b: Secondary | ICD-10-CM | POA: Diagnosis not present

## 2023-06-26 NOTE — Progress Notes (Signed)
CALAFIORE, [name] ([id number]) [id number] Page 1 of 7 Visit Report for 06/26/2023 Arrival Information Details Patient Name: Date of Service: [name] 06/26/2023 8:45 A M Medical Record Number: [id number] Patient Account Number: [id number] Date of Birth/Sex: Treating RN: May 05, 1951 (72 y.o. [name] Primary Care Waylon Koffler: [name] Other Clinician: Referring Donelle Baba: Treating Teyana Pierron/Extender: [name] in Treatment: 16 Visit Information History Since Last Visit Added or deleted any medications: No Patient Arrived: Ambulatory Any new allergies or adverse reactions: No Arrival Time: 08:45 Had a fall or experienced change in No Accompanied By: self activities of daily living that may affect Transfer Assistance: None risk of falls: Patient Identification Verified: Yes Signs or symptoms of abuse/neglect since last visito No Secondary Verification Process Completed: Yes Hospitalized since last visit: No Patient Requires Transmission-Based Precautions: No Implantable device outside of the clinic excluding No Patient Has Alerts: Yes cellular tissue based products placed in the center Patient Alerts: Patient on Blood Thinner since last visit: Has Dressing in Place as Prescribed: Yes Pain Present Now: No Electronic Signature(s) Signed: 06/26/2023 12:16:34 PM By: [name] RN, BSN Entered By: [name] on 06/26/2023 05:46:15 -------------------------------------------------------------------------------- Encounter Discharge Information Details Patient Name: Date of Service: [name] RD, [name] 06/26/2023 8:45 A M Medical Record Number: [id number] Patient Account Number: [id number] Date of Birth/Sex: Treating RN: Jun 25, 1951 (72 y.o. [name] Primary Care Gracie Gupta: [name] Other Clinician: Referring Michai Dieppa: Treating Kamariya Blevens/Extender: [name] in  Treatment: 16 Encounter Discharge Information Items Post Procedure Vitals Discharge Condition: Stable Temperature (F): 98.9 Ambulatory Status: Ambulatory Pulse (bpm): 90 Discharge Destination: Home Respiratory Rate (breaths/min): 18 Transportation: Private Auto Blood Pressure (mmHg): 182/86 Accompanied By: self Schedule Follow-up Appointment: Yes Clinical Summary of Care: Patient Declined Electronic Signature(s) Signed: 06/26/2023 12:16:34 PM By: [name] RN, BSN Entered By: [name] on 06/26/2023 06:31:16 Wolfman, [name] ([id number]) [id number] Page 2 of 7 -------------------------------------------------------------------------------- Lower Extremity Assessment Details Patient Name: Date of Service: [name] 06/26/2023 8:45 A M Medical Record Number: [id number] Patient Account Number: [id number] Date of Birth/Sex: Treating RN: 10/18/1951 (72 y.o. [name] Primary Care Maclovio Henson: [name] Other Clinician: Referring Keiri Solano: Treating Sharnice Bosler/Extender: [name]: 16 Edema Assessment Assessed: Kyra Searles: No] Franne Forts: No] Edema: [Left: N] [Right: o] Calf Left: Right: Point of Measurement: 33 cm From Medial Instep 34.5 cm Ankle Left: Right: Point of Measurement: 10 cm From Medial Instep 21.7 cm Vascular Assessment Pulses: Dorsalis Pedis Palpable: [Right:Yes] Extremity colors, hair growth, and conditions: Extremity Color: [Right:Normal] Hair Growth on Extremity: [Right:Yes] Temperature of Extremity: [Right:Warm < 3 seconds] Electronic Signature(s) Signed: 06/26/2023 12:16:34 PM By: [name] RN, BSN Entered By: [name] on 06/26/2023 05:53:23 -------------------------------------------------------------------------------- Multi Wound Chart Details Patient Name: Date of Service: [name] RD, [name] 06/26/2023 8:45 A M Medical Record Number: [id number] Patient  Account Number: [id number] Date of Birth/Sex: Treating RN: 1950-11-15 (72 y.o. M) Primary Care Shalicia Craghead: [name] Other Clinician: Referring Alvan Culpepper: Treating Armanda Forand/Extender: [name] in Treatment: 16 Vital Signs Height(in): 67 Capillary Blood Glucose(mg/dl): 517 Weight(lbs): 616 Pulse(bpm): 90 Body Mass Index(BMI): 24.1 Blood Pressure(mmHg): 182/86 Temperature(F): 98.9 Respiratory Rate(breaths/min): 18 [8:Photos:] [N/A:N/A] Right T Great oe N/A N/A Wound Location: Gradually Appeared N/A N/A Wounding Event: Diabetic Wound/Ulcer of the Lower N/A N/A Primary Etiology: Extremity Hypertension, Type II Diabetes, N/A N/A Comorbid History: Osteoarthritis, Neuropathy, Received Radiation 02/04/2023 N/A N/A Date Acquired: 16 N/A N/A Weeks of Treatment: Open N/A  N/A Wound Status: No N/A N/A Wound Recurrence: 1x1.3x0.7 N/A N/A Measurements L x W x D (cm) 1.021 N/A N/A A (cm) : rea 0.715 N/A N/A Volume (cm) : 91.20% N/A N/A % Reduction in A rea: 69.00% N/A N/A % Reduction in Volume: 12 Starting Position 1 (o'clock): 4 Ending Position 1 (o'clock): 0.5 Maximum Distance 1 (cm): Yes N/A N/A Undermining: Grade 3 N/A N/A Classification: Medium N/A N/A Exudate A mount: Serosanguineous N/A N/A Exudate Type: red, brown N/A N/A Exudate Color: Distinct, outline attached N/A N/A Wound Margin: Large (67-100%) N/A N/A Granulation A mount: Red, Friable N/A N/A Granulation Quality: None Present (0%) N/A N/A Necrotic A mount: Fat Layer (Subcutaneous Tissue): Yes N/A N/A Exposed Structures: Fascia: No Tendon: No Muscle: No Joint: No Bone: No None N/A N/A Epithelialization: Debridement - Selective/Open Wound N/A N/A Debridement: Pre-procedure Verification/Time Out 09:05 N/A N/A Taken: Callus N/A N/A Tissue Debrided: Skin/Epidermis N/A N/A Level: 4.32 N/A N/A Debridement A (sq cm): rea Curette, Forceps, Scissors N/A  N/A Instrument: Minimum N/A N/A Bleeding: Pressure N/A N/A Hemostasis A chieved: Procedure was tolerated well N/A N/A Debridement Treatment Response: 2.5x2x0.5 N/A N/A Post Debridement Measurements L x W x D (cm) 1.963 N/A N/A Post Debridement Volume: (cm) Callus: Yes N/A N/A Periwound Skin Texture: Maceration: No N/A N/A Periwound Skin Moisture: Dry/Scaly: No No Abnormalities Noted N/A N/A Periwound Skin Color: No Abnormality N/A N/A Temperature: Debridement N/A N/A Procedures Performed: Treatment Notes Electronic Signature(s) Signed: 06/26/2023 9:16:12 AM By: [name] MD FACS Entered By: [name] on 06/26/2023 06:16:12 -------------------------------------------------------------------------------- Multi-Disciplinary Care Plan Details Patient Name: Date of Service: [name] RD, [name] 06/26/2023 8:45 A M Medical Record Number: [id number] Patient Account Number: [id number] Date of Birth/Sex: Treating RN: [birth year] (72 y.o. [name] Primary Care Blen Ransome: [name] Other Clinician: [name] ([id number]) [id number] Page 4 of 7 Referring Zavannah Deblois: Treating Meadow Abramo/Extender: [name] in Treatment: 16 Multidisciplinary Care Plan reviewed with physician Active Inactive Nutrition Nursing Diagnoses: Imbalanced nutrition Impaired glucose control: actual or potential Potential for alteratiion in Nutrition/Potential for imbalanced nutrition Goals: Patient/caregiver will maintain therapeutic glucose control Date Initiated: 03/17/2023 Target Resolution Date: 07/24/2023 Goal Status: Active Interventions: Assess HgA1c results as ordered upon admission and as needed Assess patient nutrition upon admission and as needed per policy Treatment Activities: Patient referred to Primary Care Physician for further nutritional evaluation : 03/17/2023 Notes: Wound/Skin Impairment Nursing  Diagnoses: Impaired tissue integrity Knowledge deficit related to ulceration/compromised skin integrity Goals: Patient/caregiver will verbalize understanding of skin care regimen Date Initiated: 03/17/2023 Target Resolution Date: 07/24/2023 Goal Status: Active Ulcer/skin breakdown will have a volume reduction of 30% by week 4 Date Initiated: 03/17/2023 Date Inactivated: 04/02/2023 Target Resolution Date: 03/31/2023 Goal Status: Met Ulcer/skin breakdown will have a volume reduction of 50% by week 8 Date Initiated: 04/02/2023 Date Inactivated: 05/07/2023 Target Resolution Date: 04/30/2023 Goal Status: Met Interventions: Assess patient/caregiver ability to obtain necessary supplies Assess patient/caregiver ability to perform ulcer/skin care regimen upon admission and as needed Assess ulceration(s) every visit Treatment Activities: Skin care regimen initiated : 03/17/2023 Topical wound management initiated : 03/17/2023 Notes: Electronic Signature(s) Signed: 06/26/2023 12:16:34 PM By: [name] RN, BSN Entered By: [name] on 06/26/2023 06:00:01 -------------------------------------------------------------------------------- Pain Assessment Details Patient Name: Date of Service: [name] RD, [name] 06/26/2023 8:45 A M Medical Record Number: [id number] Patient Account Number: [id number] Date of Birth/Sex: Treating RN: [birth year] (72 y.o. [name] Primary Care Janayia Burggraf: [name] Other Clinician: Referring Tyrie Porzio: Treating  Bunnie Lederman/Extender: [name] Richardson, Karol ([id number]) [phone].pdf Page 5 of 7 Weeks in Treatment: 16 Active Problems Location of Pain Severity and Description of Pain Patient Has Paino No Site Locations Rate the pain. Current Pain Level: 0 Pain Management and Medication Current Pain Management: Electronic Signature(s) Signed: 06/26/2023 12:16:34 PM By: [name] RN, BSN Entered By:  [name] on 06/26/2023 05:48:08 -------------------------------------------------------------------------------- Patient/Caregiver Education Details Patient Name: Date of Service: [name] RD, [name] 8/30/2024andnbsp8:45 A M Medical Record Number: [id number] Patient Account Number: [id number] Date of Birth/Gender: Treating RN: [birth year] (72 y.o. [name] Primary Care Physician: [name] Other Clinician: Referring Physician: Treating Physician/Extender: [name] in Treatment: 16 Education Assessment Education Provided To: Patient Education Topics Provided Elevated Blood Sugar/ Impact on Healing: Methods: Explain/Verbal Responses: Reinforcements needed, State content correctly Wound/Skin Impairment: Methods: Explain/Verbal Responses: Reinforcements needed, State content correctly Electronic Signature(s) Signed: 06/26/2023 12:16:34 PM By: [name] RN, BSN Entered By: [name] on 06/26/2023 06:00:33 [name], [name] ([id number]) [id number] Page 6 of 7 -------------------------------------------------------------------------------- Wound Assessment Details Patient Name: Date of Service: [name] 06/26/2023 8:45 A M Medical Record Number: [id number] Patient Account Number: [id number] Date of Birth/Sex: Treating RN: 1951-07-01 (72 y.o. [name] Primary Care Abdinasir Spadafore: [name] Other Clinician: Referring Jaimya Feliciano: Treating Darren Nodal/Extender: [name]: 16 Wound Status Wound Number: 8 Primary Diabetic Wound/Ulcer of the Lower Extremity Etiology: Wound Location: Right T Great oe Wound Open Wounding Event: Gradually Appeared Status: Date Acquired: 02/04/2023 Comorbid Hypertension, Type II Diabetes, Osteoarthritis, Neuropathy, Weeks Of Treatment: 16 History: Received Radiation Clustered Wound: No Photos Wound  Measurements Length: (cm) 1 Width: (cm) 1.3 Depth: (cm) 0.7 Area: (cm) 1.021 Volume: (cm) 0.715 % Reduction in Area: 91.2% % Reduction in Volume: 69% Epithelialization: None Tunneling: No Undermining: Yes Starting Position (o'clock): 12 Ending Position (o'clock): 4 Maximum Distance: (cm) 0.5 Wound Description Classification: Grade 3 Wound Margin: Distinct, outline attached Exudate Amount: Medium Exudate Type: Serosanguineous Exudate Color: red, brown Foul Odor After Cleansing: No Slough/Fibrino Yes Wound Bed Granulation Amount: Large (67-100%) Exposed Structure Granulation Quality: Red, Friable Fascia Exposed: No Necrotic Amount: None Present (0%) Fat Layer (Subcutaneous Tissue) Exposed: Yes Tendon Exposed: No Muscle Exposed: No Joint Exposed: No Bone Exposed: No Periwound Skin Texture Texture Color No Abnormalities Noted: No No Abnormalities Noted: Yes Callus: Yes Temperature / Pain Temperature: No Abnormality Moisture No Abnormalities Noted: No Dry / Scaly: No Maceration: No [place], Marquet ([id number]) [id number] Page 7 of 7 Treatment Notes Wound #8 (Toe Great) Wound Laterality: Right Cleanser Normal Saline Discharge Instruction: Cleanse the wound with Normal Saline prior to applying a clean dressing using gauze sponges, not tissue or cotton balls. Soap and Water Discharge Instruction: May shower and wash wound with dial antibacterial soap and water prior to dressing change. Peri-Wound Care Topical Primary Dressing Maxorb Extra Ag+ Alginate Dressing, 4x4.75 (in/in) Discharge Instruction: Apply to wound bed as instructed Secondary Dressing Optifoam Non-Adhesive Dressing, 4x4 in Discharge Instruction: Apply over primary dressing cut to make donut Woven Gauze Sponges 2x2 in Discharge Instruction: Apply over primary dressing as directed. Secured With Conforming Stretch Gauze Bandage, Sterile 2x75 (in/in) Discharge Instruction:  Secure with stretch gauze as directed. Compression Wrap Compression Stockings Add-Ons Electronic Signature(s) Signed: 06/26/2023 12:16:34 PM By: [name] RN, BSN Entered By: [name] on 06/26/2023 05:58:56 -------------------------------------------------------------------------------- Vitals Details Patient Name: Date of Service: [name] RD, [name] 06/26/2023 8:45 A M Medical Record Number: [id number] Patient Account Number:  [id number] Date of Birth/Sex: Treating RN: 1951-01-04 (72 y.o. [name] Primary Care Alajia Schmelzer: [name] Other Clinician: Referring Jamaal Bernasconi: Treating Deshannon Seide/Extender: [name] in Treatment: 16 Vital Signs Time Taken: 08:47 Temperature (F): 98.9 Height (in): 67 Pulse (bpm): 90 Weight (lbs): 154 Respiratory Rate (breaths/min): 18 Body Mass Index (BMI): 24.1 Blood Pressure (mmHg): 182/86 Capillary Blood Glucose (mg/dl): 409 Reference Range: 80 - 120 mg / dl Notes glucose per pt report this am Electronic Signature(s) Signed: 06/26/2023 12:16:34 PM By: [name] RN, BSN Entered By: [name] on 06/26/2023 05:47:56

## 2023-06-26 NOTE — Progress Notes (Addendum)
SHERMAR, WISMAN ([id number]) [id number] Page 1 of 11 Visit Report for 06/26/2023 Chief Complaint Document Details Patient Name: Date of Service: [name] [name] 06/26/2023 8:45 [name] M Medical Record Number: [id number] Patient Account Number: [id number] Date of Birth/Sex: Treating RN: Jul 23, 1951 (72 y.o. M) Primary Care Provider: Abbe [name] Other Clinician: Referring Provider: Treating Provider/Extender: [name] in Treatment: 16 Information Obtained from: Patient Chief Complaint Bilateral T Ulcers oe 03/02/2023: right great toe ulcer with osteomyelitis Electronic Signature(s) Signed: 06/26/2023 9:16:40 AM By: [name] MD FACS Entered By: [name] on 06/26/2023 06:16:40 -------------------------------------------------------------------------------- Debridement Details Patient Name: Date of Service: [name] [name], [name] [name] 06/26/2023 8:45 [name] M Medical Record Number: [id number] Patient Account Number: [id number] Date of Birth/Sex: Treating RN: 24-Sep-1951 (72 y.o. M) Primary Care Provider: Abbe [name] Other Clinician: Referring Provider: Treating Provider/Extender: [name] in Treatment: 16 Debridement Performed for Assessment: Wound #8 Right T Great oe Performed By: Physician [name], MD Debridement Type: Debridement Severity of Tissue Pre Debridement: Fat layer exposed Level of Consciousness (Pre-procedure): Awake and Alert Pre-procedure Verification/Time Out Yes - 09:05 Taken: Start Time: 09:06 Percent of Wound Bed Debrided: 110% T Area Debrided (cm): otal 4.32 Tissue and other material debrided: Non-Viable, Callus, Skin: Epidermis Level: Skin/Epidermis Debridement Description: Selective/Open Wound Instrument: Curette, Forceps, Scissors Specimen: Tissue Culture Number of Specimens T aken: 1 Bleeding: Minimum Hemostasis Achieved: Pressure Response to Treatment:  Procedure was tolerated well Level of Consciousness (Post- Awake and Alert procedure): Post Debridement Measurements of Total Wound Length: (cm) 2.5 Width: (cm) 2 Depth: (cm) 0.5 Volume: (cm) 1.963 Character of Wound/Ulcer Post Debridement: Improved [name] [name], [name] [name] ([id number]) [id number] Page 2 of 11 Severity of Tissue Post Debridement: Fat layer exposed Post Procedure Diagnosis Same as Pre-procedure Notes scribed for Dr. Lady Gary by [name], RN Electronic Signature(s) Signed: 06/26/2023 9:16:31 AM By: [name] MD FACS Entered By: [name] on 06/26/2023 06:16:31 -------------------------------------------------------------------------------- HPI Details Patient Name: Date of Service: [name] [name], [name] [name] 06/26/2023 8:45 [name] M Medical Record Number: [id number] Patient Account Number: [id number] Date of Birth/Sex: Treating RN: [birth year] (72 y.o. M) Primary Care Provider: Abbe [name] Other Clinician: Referring Provider: Treating Provider/Extender: [name] in Treatment: 16 History of Present Illness HPI Description: 09/26/2020 on evaluation today patient appears to be doing somewhat poorly in regard to his bilateral feet at this point due to issues that he is been having that developed about 2 weeks ago. He was walking home which was about [name] mile and states that he first noted the areas on the right foot beginning. These were blisters that develop. The left started Thanksgiving day when he was up standing more cooking. He did see his primary care provider who referred him to podiatry. He saw Dr. Allena Katz and Dr. Allena Katz recommended Betadine moistened gauze dressings to the wounds and to follow-up in 2 weeks. With that being said the patient subsequently was concerned about infection 2 to 3 days later as was his family member who is with him today. I believe this was [name] sister. With that being said subsequently he  ended up going to urgent care at that point he was given Diflucan and Keflex he is done with both at this time. He is could be canceling the appointment with Dr. Allena Katz they tell me at this point. His most recent hemoglobin A1c was 6.6 on November 12. His ABIs were 1.2 on the right and 1.03 on the left and appear to be doing excellent.  With that being said he does not have diabetic shoes and I feel like the shoes were likely the culprit for what led to this issue currently. There does not appear to be any signs of systemic infection nor local infection at this point which is great news. The patient does have [name] history of hypertension as well as potentially [name] recurrence of prostate cancer he is being followed by the cancer center for that at this point 10/03/2020 upon evaluation today patient appears to be doing better in regard to his toe ulcers. He has been tolerating the dressing changes without complication. Fortunately there is no signs of active infection at this time. The right great toe and fifth toe are the 2 worst out of everything here he has [name] couple that have healed on the left foot there is really nothing remaining open though he has several areas of callus on the left foot in fact 3 on the third fourth and fifth toes. That is going need to be removed today as well. 10/10/2020 upon evaluation today patient appears to be doing well in regard to his wounds. He still has wounds open on the first, second, and fifth toes of the right foot. Left foot is completely healed and the right fourth toe is also completely healed today. Overall very pleased with how things seem to be progressing. 10/17/2020 on evaluation today patient appears to be doing much better in regard to the right first, second, and fifth toes. He is making excellent progress and to be honest I am extremely pleased with where things stand today. Fortunately there is no signs of active infection at this time. No fevers, chills,  nausea, vomiting, or diarrhea. 11/07/2020 upon evaluation today patient appears to actually be doing excellent in regard to his toe ulcers. The fifth and first toe on the right foot are what is left at this point. There does not appear to be signs of infection currently which is great news. With that being said I did perform some sharp debridement today to remove some of the callus around the edges of the wound 11/14/2020 upon evaluation today patient appears to be doing excellent in regard to his toe ulcers. In fact he is almost completely healed he just has 1 area on the great toe remaining at this time and this is very small. Overall he is extremely pleased with where things stand as MI. 11/21/2020 upon evaluation today patient appears to be doing well with regard to his foot ulcer. In fact this appears to be completely healed on the great toe which was the last of the areas of concern. Fortunately there is no signs of active infection at this time. No fevers, chills, nausea, vomiting, or diarrhea. READMISSION 03/02/2023 This is [name] now 72 year old type II diabetic (last hemoglobin A1c 7.9%) who returns with [name] right great toe ulcer. On February 12, 2023, he was seen by podiatry who noted: "He has superficial wounds to the second and fourth toes of the right foot however the hallux does not straight [name] very large bulbous macerated mass of tissue including the nail plate the tuft of the toe. Once this was attempted to be debrided is noted that there was [name] foul odor no purulence no significant cellulitic process but bone was visible through the wound. There is gray necrotic tissue beneath the macerated cutis." Plain radiographs taken that day demonstrated breakdown of the distal phalanx, gas in the tissue of the toe. [name] stat MRI was subsequently ordered. Those  results are copied here: IMPRESSION: 1. Osteomyelitis of the distal phalanx great toe with overlying ulceration and speckled gas in the soft tissues of  the great toe. 2. Mild dorsal subcutaneous edema in the forefoot, cellulitis is not excluded by imaging. 3. Accentuated T2 signal dorsally over the second and third toes, conceivably from bandaging or blistering, correlate with visual inspection. [name], [name] ([id number]) [id number] Page 3 of 11 4. Small focus of metal artifact along the plantar soft tissues of the forefoot below the third metatarsal head, not visible on recent conventional radiographs, probably [name] microscopic metallic particle. He was started on Augmentin for 10-day course. He apparently requested [name] second opinion regarding the osteomyelitis and need for amputation and saw Dr. Allena Katz on May 1. Dr. Eliane Decree note is incomplete at this time so it is not clear what was discussed. I do not see any formal vascular studies in the patient's chart. ABI in clinic today was noncompressible, but he has easily palpable distal pulses. 03/09/2023 He has [name] new ulcer that has opened up on the tip of his second toe. This is limited to the fat layer. There is [name] little bit of slough on the surface. The great toe ulcer has continued presence of necrotic muscle and subcutaneous tissue with bone exposure. He is taking Augmentin and doxycycline. According to his insurance, no preapproval is necessary for hyperbaric oxygen therapy, but they cannot guarantee it will be paid for. 03/17/2023: There is [name] bit of maceration of the skin between his great and second toes, but no frank tissue breakdown. The ulcer on the tip of his second toe is [name] little bit smaller. The great toe ulcer is cleaner. He has received approval for hyperbaric oxygen therapy. 04/02/2023: The second toe is healed. The ulcer on the tip of his great toe has hypertrophic granulation tissue. Bone is still palpable in the center. There is some accumulation of callus and senescent skin around the wound margins. We have been trying to get in touch with him to schedule  him for initiation of hyperbaric oxygen therapy. He has been approved for Epicord, but we do not have 1 here for him today. 04/09/2023: The ulcer on the tip of his great toe has filled in with more granulation tissue. It does still probe to bone, but appears much healthier. He started hyperbaric oxygen therapy on Monday. We have been struggling [name] little bit with hypoglycemia during and after his treatments, but we are working on [name] resolution to that. We are going to apply Epicord no. 1 today. 04/17/2023: There has been substantial infill of granulation tissue. I am no longer able to probe to bone. He is doing well with his hyperbaric oxygen therapy and we have not experienced any further episodes of hypoglycemia. 04/23/2023: His wound is responding beautifully to both the skin substitute and hyperbaric oxygen therapy. Current measurements:1.4x2.4x0.1cm. There is just slight slough on the surface and bone is completely covered with [name] good layer of healthy tissue. 04/29/2023: The wound continues to improve. There is some periwound callus buildup as well as thin slough on the wound surface. Measurements L x W x D (cm) 1.4x1.8x0.1 Area (cm): 1.979 % Reduction in Area: 82.90% . % Reduction in Volume: 91.40% 05/07/2023: The wound is smaller again today. There is [name] little bit of callus accumulation around the margins with some slough on the surface. Excellent coverage of the bone with healthy-looking granulation tissue. Wound measurements: 0.6x1.2x0.1 05/14/2023: The wound is smaller again today. There is callus accumulation  around the edges with [name] little bit of slough on the surface. Measurements L x W x D (cm) 0.5x1.2x0.2 Area (cm): 0.471 Volume (cm) : 0.094 % Reduction in Area: 95.90% % Reduction in Volume: 95.90% 05/21/2023: He says that he was on his feet most of Saturday and his foot got wet. There is tissue breakdown secondary to moisture on the plantar aspect of his toe. There is gray-green slough  and callus accumulation. 05/28/2023: The wound looks better today. There is still some moisture related tissue breakdown that has progressed on the plantar aspect of his toe, but I think this is residual from last week, as there is no active tissue maceration. Measurements L x W x D (cm) 0.8x1x0. Area (cm): 0.628 Volume (cm) : 0.188 % Reduction in Area: 94.60% % Reduction in Volume: 91.90% 06/05/2023: The wound has improved considerably over the past week. There has been substantial epithelialization. There is some callus accumulation around the edges and light slough on the surface. 06/12/2023: The wound continues to improve. It is down to less than half [name] centimeter in greatest dimension and is completely flush with the surrounding tissue. There is some dry skin and callus accumulation around the edges with light slough on the surface. Measurements L x W x D (cm) 0.3x0.5x0.1 Area (cm): 0.118 Volume (cm) : 0.012 % Reduction in Area: 99.00% % Reduction in Volume: 99.50% 06/19/2023: The wound measured the same this week, but visually it appears smaller. There is some surrounding callus and dry skin but the surface looks robust and healthy. 06/26/2023: Unfortunately, there has been massive breakdown of his wound. Although the dressing did not have any drainage on it, there was extensive fluid and blood that collected underneath the periwound callus that has resulted in substantial deterioration. The bone remains covered with soft tissue, but the wound is markedly larger. There appears to be some pressure-induced tissue injury on the weightbearing aspect of the toe. Electronic Signature(s) Signed: 06/26/2023 9:20:26 AM By: [name] MD FACS Entered By: [name] on 06/26/2023 06:20:26 Physical Exam Details -------------------------------------------------------------------------------- [name] [name] ([id number]) [id number] Page 4 of 11 Patient Name: Date  of Service: [name] [name] 06/26/2023 8:45 [name] M Medical Record Number: [id number] Patient Account Number: [id number] Date of Birth/Sex: Treating RN: 1951/07/09 (72 y.o. M) Primary Care Provider: Abbe [name] Other Clinician: Referring Provider: Treating Provider/Extender: [name]: 16 Constitutional Hypertensive, asymptomatic. . . . no acute distress. Respiratory Normal work of breathing on room air. Notes 06/26/2023: Unfortunately, there has been massive breakdown of his wound. Although the dressing did not have any drainage on it, there was extensive fluid and blood that collected underneath the periwound callus that has resulted in substantial deterioration. The bone remains covered with soft tissue, but the wound is markedly larger. There appears to be some pressure-induced tissue injury on the weightbearing aspect of the toe. Electronic Signature(s) Signed: 06/26/2023 9:21:14 AM By: [name] MD FACS Entered By: [name] on 06/26/2023 06:21:14 -------------------------------------------------------------------------------- Physician Orders Details Patient Name: Date of Service: [name] [name], [name] [name] 06/26/2023 8:45 [name] M Medical Record Number: [id number] Patient Account Number: [id number] Date of Birth/Sex: Treating RN: 31-Aug-1951 (72 y.o. [name] Primary Care Provider: Abbe [name] Other Clinician: Referring Provider: Treating Provider/Extender: [name] in Treatment: [phone] Verbal / Phone Orders: No Diagnosis Coding ICD-10 Coding Code Description L97.514 Non-pressure chronic ulcer of other part of right foot with necrosis of bone M86.171 Other acute osteomyelitis, right ankle  and foot E11.621 Type 2 diabetes mellitus with foot ulcer I10 Essential (primary) hypertension N18.32 Chronic kidney disease, stage 3b Follow-up Appointments ppointment in 1 week. - Dr Lady Gary Rm 1 Return  [name] Monday 9/9 @ 3:30 pm Anesthetic (In clinic) Topical Lidocaine 4% applied to wound bed Bathing/ Shower/ Hygiene May shower with protection but do not get wound dressing(s) wet. Protect dressing(s) with water repellant cover (for example, large plastic bag) or [name] cast cover and may then take shower. Edema Control - Lymphedema / SCD / Other Bilateral Lower Extremities Avoid standing for long periods of time. Exercise regularly Moisturize legs daily. Off-Loading Wound #8 Right T Great oe Other: - Wear the [employer] front offloading sandal when walking, minimal weight bearing right foot Wound Treatment Wound #8 - T Great oe Wound Laterality: Right [name] [name], [name] [name] ([id number]) [id number] Page 5 of 11 Cleanser: Normal Saline (Generic) 1 x Per Day/30 Days Discharge Instructions: Cleanse the wound with Normal Saline prior to applying [name] clean dressing using gauze sponges, not tissue or cotton balls. Cleanser: Soap and Water 1 x Per Day/30 Days Discharge Instructions: May shower and wash wound with dial antibacterial soap and water prior to dressing change. Prim Dressing: Maxorb Extra Ag+ Alginate Dressing, 4x4.75 (in/in) 1 x Per Day/30 Days ary Discharge Instructions: Apply to wound bed as instructed Secondary Dressing: Optifoam Non-Adhesive Dressing, 4x4 in 1 x Per Day/30 Days Discharge Instructions: Apply over primary dressing cut to make donut Secondary Dressing: Woven Gauze Sponges 2x2 in (Generic) 1 x Per Day/30 Days Discharge Instructions: Apply over primary dressing as directed. Secured With: Insurance underwriter, Sterile 2x75 (in/in) 1 x Per Day/30 Days Discharge Instructions: Secure with stretch gauze as directed. Laboratory naerobe culture (MICRO) - right great toe Bacteria identified in Unspecified specimen by [name] LOINC Code: 635-3 Convenience Name: Anaerobic culture Electronic Signature(s) Signed: 06/26/2023 11:20:44 AM By: [name]  MD FACS Entered By: [name] on 06/26/2023 06:21:24 -------------------------------------------------------------------------------- Problem List Details Patient Name: Date of Service: [name] [name], [name] [name] 06/26/2023 8:45 [name] M Medical Record Number: [id number] Patient Account Number: [id number] Date of Birth/Sex: Treating RN: 21-Aug-1951 (72 y.o. [name] Primary Care Provider: Abbe [name] Other Clinician: Referring Provider: Treating Provider/Extender: [name] in Treatment: 16 Active Problems ICD-10 Encounter Code Description Active Date MDM Diagnosis L97.514 Non-pressure chronic ulcer of other part of right foot with necrosis of bone 03/02/2023 No Yes M86.171 Other acute osteomyelitis, right ankle and foot 03/02/2023 No Yes E11.621 Type 2 diabetes mellitus with foot ulcer 03/02/2023 No Yes I10 Essential (primary) hypertension 03/02/2023 No Yes N18.32 Chronic kidney disease, stage 3b 03/02/2023 No Yes Inactive Problems Crager, Avelino ([id number]) [id number] Page 6 of 11 Resolved Problems ICD-10 Code Description Active Date Resolved Date L97.512 Non-pressure chronic ulcer of other part of right foot with fat layer exposed 03/09/2023 03/09/2023 Electronic Signature(s) Signed: 06/26/2023 9:16:04 AM By: [name] MD FACS Entered By: [name] on 06/26/2023 06:16:03 -------------------------------------------------------------------------------- Progress Note Details Patient Name: Date of Service: [name] [name], [name] [name] 06/26/2023 8:45 [name] M Medical Record Number: [id number] Patient Account Number: [id number] Date of Birth/Sex: Treating RN: 1951/05/30 (72 y.o. M) Primary Care Provider: Abbe [name] Other Clinician: Referring Provider: Treating Provider/Extender: [name] in Treatment: 16 Subjective Chief Complaint Information obtained from Patient Bilateral T Ulcers 03/02/2023:  right great toe ulcer with osteomyelitis oe History of Present Illness (HPI) 09/26/2020 on evaluation today patient appears to be doing somewhat poorly in regard to his bilateral feet  at this point due to issues that he is been having that developed about 2 weeks ago. He was walking home which was about [name] mile and states that he first noted the areas on the right foot beginning. These were blisters that develop. The left started Thanksgiving day when he was up standing more cooking. He did see his primary care provider who referred him to podiatry. He saw Dr. Allena Katz and Dr. Allena Katz recommended Betadine moistened gauze dressings to the wounds and to follow-up in 2 weeks. With that being said the patient subsequently was concerned about infection 2 to 3 days later as was his family member who is with him today. I believe this was [name] sister. With that being said subsequently he ended up going to urgent care at that point he was given Diflucan and Keflex he is done with both at this time. He is could be canceling the appointment with Dr. Allena Katz they tell me at this point. His most recent hemoglobin A1c was 6.6 on November 12. His ABIs were 1.2 on the right and 1.03 on the left and appear to be doing excellent. With that being said he does not have diabetic shoes and I feel like the shoes were likely the culprit for what led to this issue currently. There does not appear to be any signs of systemic infection nor local infection at this point which is great news. The patient does have [name] history of hypertension as well as potentially [name] recurrence of prostate cancer he is being followed by the cancer center for that at this point 10/03/2020 upon evaluation today patient appears to be doing better in regard to his toe ulcers. He has been tolerating the dressing changes without complication. Fortunately there is no signs of active infection at this time. The right great toe and fifth toe are the 2 worst out of  everything here he has [name] couple that have healed on the left foot there is really nothing remaining open though he has several areas of callus on the left foot in fact 3 on the third fourth and fifth toes. That is going need to be removed today as well. 10/10/2020 upon evaluation today patient appears to be doing well in regard to his wounds. He still has wounds open on the first, second, and fifth toes of the right foot. Left foot is completely healed and the right fourth toe is also completely healed today. Overall very pleased with how things seem to be progressing. 10/17/2020 on evaluation today patient appears to be doing much better in regard to the right first, second, and fifth toes. He is making excellent progress and to be honest I am extremely pleased with where things stand today. Fortunately there is no signs of active infection at this time. No fevers, chills, nausea, vomiting, or diarrhea. 11/07/2020 upon evaluation today patient appears to actually be doing excellent in regard to his toe ulcers. The fifth and first toe on the right foot are what is left at this point. There does not appear to be signs of infection currently which is great news. With that being said I did perform some sharp debridement today to remove some of the callus around the edges of the wound 11/14/2020 upon evaluation today patient appears to be doing excellent in regard to his toe ulcers. In fact he is almost completely healed he just has 1 area on the great toe remaining at this time and this is very small. Overall he is extremely  pleased with where things stand as MI. 11/21/2020 upon evaluation today patient appears to be doing well with regard to his foot ulcer. In fact this appears to be completely healed on the great toe which was the last of the areas of concern. Fortunately there is no signs of active infection at this time. No fevers, chills, nausea, vomiting, or diarrhea. READMISSION 03/02/2023 This is [name]  now 72 year old type II diabetic (last hemoglobin A1c 7.9%) who returns with [name] right great toe ulcer. On February 12, 2023, he was seen by podiatry who noted: "He has superficial wounds to the second and fourth toes of the right foot however the hallux does not straight [name] very large bulbous macerated mass of tissue including the nail plate the tuft of the toe. Once this was attempted to be debrided is noted that there was [name] foul odor no purulence no significant cellulitic process but bone was visible through the wound. There is gray necrotic tissue beneath the macerated cutis." Plain radiographs taken that day demonstrated breakdown of the distal phalanx, gas in the tissue of the toe. [name] stat MRI was subsequently ordered. Those results are copied here: IMPRESSION: 1. Osteomyelitis of the distal phalanx great toe with overlying ulceration and speckled gas in the soft tissues of the great toe. 2. Mild dorsal subcutaneous edema in the forefoot, cellulitis is not [name] [name], [name] [name] ([id number]) [id number] Page 7 of 11 excluded by imaging. 3. Accentuated T2 signal dorsally over the second and third toes, conceivably from bandaging or blistering, correlate with visual inspection. 4. Small focus of metal artifact along the plantar soft tissues of the forefoot below the third metatarsal head, not visible on recent conventional radiographs, probably [name] microscopic metallic particle. He was started on Augmentin for 10-day course. He apparently requested [name] second opinion regarding the osteomyelitis and need for amputation and saw Dr. Allena Katz on May 1. Dr. Eliane Decree note is incomplete at this time so it is not clear what was discussed. I do not see any formal vascular studies in the patient's chart. ABI in clinic today was noncompressible, but he has easily palpable distal pulses. 03/09/2023 He has [name] new ulcer that has opened up on the tip of his second toe. This is limited to the fat  layer. There is [name] little bit of slough on the surface. The great toe ulcer has continued presence of necrotic muscle and subcutaneous tissue with bone exposure. He is taking Augmentin and doxycycline. According to his insurance, no preapproval is necessary for hyperbaric oxygen therapy, but they cannot guarantee it will be paid for. 03/17/2023: There is [name] bit of maceration of the skin between his great and second toes, but no frank tissue breakdown. The ulcer on the tip of his second toe is [name] little bit smaller. The great toe ulcer is cleaner. He has received approval for hyperbaric oxygen therapy. 04/02/2023: The second toe is healed. The ulcer on the tip of his great toe has hypertrophic granulation tissue. Bone is still palpable in the center. There is some accumulation of callus and senescent skin around the wound margins. We have been trying to get in touch with him to schedule him for initiation of hyperbaric oxygen therapy. He has been approved for Epicord, but we do not have 1 here for him today. 04/09/2023: The ulcer on the tip of his great toe has filled in with more granulation tissue. It does still probe to bone, but appears much healthier. He started hyperbaric oxygen therapy on Monday. We have  been struggling [name] little bit with hypoglycemia during and after his treatments, but we are working on [name] resolution to that. We are going to apply Epicord no. 1 today. 04/17/2023: There has been substantial infill of granulation tissue. I am no longer able to probe to bone. He is doing well with his hyperbaric oxygen therapy and we have not experienced any further episodes of hypoglycemia. 04/23/2023: His wound is responding beautifully to both the skin substitute and hyperbaric oxygen therapy. Current measurements:1.4x2.4x0.1cm. There is just slight slough on the surface and bone is completely covered with [name] good layer of healthy tissue. 04/29/2023: The wound continues to improve. There is some periwound  callus buildup as well as thin slough on the wound surface. Measurements L x W x D (cm) 1.4x1.8x0.1 Area (cm): 1.979 % Reduction in Area: 82.90% . % Reduction in Volume: 91.40% 05/07/2023: The wound is smaller again today. There is [name] little bit of callus accumulation around the margins with some slough on the surface. Excellent coverage of the bone with healthy-looking granulation tissue. Wound measurements: 0.6x1.2x0.1 05/14/2023: The wound is smaller again today. There is callus accumulation around the edges with [name] little bit of slough on the surface. Measurements L x W x D (cm) 0.5x1.2x0.2 Area (cm): 0.471 Volume (cm) : 0.094 % Reduction in Area: 95.90% % Reduction in Volume: 95.90% 05/21/2023: He says that he was on his feet most of Saturday and his foot got wet. There is tissue breakdown secondary to moisture on the plantar aspect of his toe. There is gray-green slough and callus accumulation. 05/28/2023: The wound looks better today. There is still some moisture related tissue breakdown that has progressed on the plantar aspect of his toe, but I think this is residual from last week, as there is no active tissue maceration. Measurements L x W x D (cm) 0.8x1x0. Area (cm): 0.628 Volume (cm) : 0.188 % Reduction in Area: 94.60% % Reduction in Volume: 91.90% 06/05/2023: The wound has improved considerably over the past week. There has been substantial epithelialization. There is some callus accumulation around the edges and light slough on the surface. 06/12/2023: The wound continues to improve. It is down to less than half [name] centimeter in greatest dimension and is completely flush with the surrounding tissue. There is some dry skin and callus accumulation around the edges with light slough on the surface. Measurements L x W x D (cm) 0.3x0.5x0.1 Area (cm): 0.118 Volume (cm) : 0.012 % Reduction in Area: 99.00% % Reduction in Volume: 99.50% 06/19/2023: The wound measured the same this week, but  visually it appears smaller. There is some surrounding callus and dry skin but the surface looks robust and healthy. 06/26/2023: Unfortunately, there has been massive breakdown of his wound. Although the dressing did not have any drainage on it, there was extensive fluid and blood that collected underneath the periwound callus that has resulted in substantial deterioration. The bone remains covered with soft tissue, but the wound is markedly larger. There appears to be some pressure-induced tissue injury on the weightbearing aspect of the toe. Patient History Information obtained from Patient. Family History Diabetes - Mother,Siblings, No family history of Cancer, Heart Disease, Hereditary Spherocytosis, Hypertension, Kidney Disease, Lung Disease, Seizures, Stroke, Thyroid Problems, Tuberculosis. Social History Never smoker, Marital Status - [name], Alcohol Use - Never, Drug Use - No History, Caffeine Use - Rarely. Medical History MAKARI, ROPER ([id number]) [id number] Page 8 of 11 Cardiovascular Patient has history of Hypertension Endocrine Patient has history of Type  II Diabetes Denies history of Type I Diabetes Musculoskeletal Patient has history of Osteoarthritis Neurologic Patient has history of Neuropathy Oncologic Patient has history of Received Radiation Medical [name] Surgical History Notes nd Eyes Diabetic retinopathy Cardiovascular Carotid artery stenosis Oncologic Prostate cancer Objective Constitutional Hypertensive, asymptomatic. no acute distress. Vitals Time Taken: 8:47 AM, Height: 67 in, Weight: 154 lbs, BMI: 24.1, Temperature: 98.9 F, Pulse: 90 bpm, Respiratory Rate: 18 breaths/min, Blood Pressure: 182/86 mmHg, Capillary Blood Glucose: 175 mg/dl. General Notes: glucose per pt report this am Respiratory Normal work of breathing on room air. General Notes: 06/26/2023: Unfortunately, there has been massive breakdown of his wound.  Although the dressing did not have any drainage on it, there was extensive fluid and blood that collected underneath the periwound callus that has resulted in substantial deterioration. The bone remains covered with soft tissue, but the wound is markedly larger. There appears to be some pressure-induced tissue injury on the weightbearing aspect of the toe. Integumentary (Hair, Skin) Wound #8 status is Open. Original cause of wound was Gradually Appeared. The date acquired was: 02/04/2023. The wound has been in treatment 16 weeks. The wound is located on the Right T Great. The wound measures 1cm length x 1.3cm width x 0.7cm depth; 1.021cm^2 area and 0.715cm^3 volume. There is oe Fat Layer (Subcutaneous Tissue) exposed. There is no tunneling noted, however, there is undermining starting at 12:00 and ending at 4:00 with [name] maximum distance of 0.5cm. There is [name] medium amount of serosanguineous drainage noted. The wound margin is distinct with the outline attached to the wound base. There is large (67-100%) red, friable granulation within the wound bed. There is no necrotic tissue within the wound bed. The periwound skin appearance had no abnormalities noted for color. The periwound skin appearance exhibited: Callus. The periwound skin appearance did not exhibit: Dry/Scaly, Maceration. Periwound temperature was noted as No Abnormality. Assessment Active Problems ICD-10 Non-pressure chronic ulcer of other part of right foot with necrosis of bone Other acute osteomyelitis, right ankle and foot Type 2 diabetes mellitus with foot ulcer Essential (primary) hypertension Chronic kidney disease, stage 3b Procedures Wound #8 Pre-procedure diagnosis of Wound #8 is [name] Diabetic Wound/Ulcer of the Lower Extremity located on the Right T Great .Severity of Tissue Pre Debridement is: oe Fat layer exposed. There was [name] Selective/Open Wound Skin/Epidermis Debridement with [name] total area of 4.32 sq cm performed by [name], MD. With the following instrument(s): Curette, Forceps, and Scissors to remove Non-Viable tissue/material. Material removed includes Callus and Skin: Epidermis and. 1 specimen was taken by [name] Tissue Culture and sent to the lab per facility protocol. [name] time out was conducted at 09:05, prior to the start of the procedure. [name] Minimum amount of bleeding was controlled with Pressure. The procedure was tolerated well. Post Debridement Measurements: 2.5cm length x 2cm width x 0.5cm depth; 1.963cm^3 volume. Character of Wound/Ulcer Post Debridement is improved. Severity of Tissue Post Debridement is: Fat layer exposed. Post procedure Diagnosis Wound #8: Same as Pre-Procedure General Notes: scribed for Dr. Lady Gary by [name], RN. [name] [name], [name] [name] ([id number]) [id number] Page 9 of 11 Plan Follow-up Appointments: Return Appointment in 1 week. - Dr Lady Gary Rm 1 Monday 9/9 @ 3:30 pm Anesthetic: (In clinic) Topical Lidocaine 4% applied to wound bed Bathing/ Shower/ Hygiene: May shower with protection but do not get wound dressing(s) wet. Protect dressing(s) with water repellant cover (for example, large plastic bag) or [name] cast cover and may then take shower. Edema Control -  Lymphedema / SCD / Other: Avoid standing for long periods of time. Exercise regularly Moisturize legs daily. Off-Loading: Wound #8 Right T Great: oe Other: - Wear the Darco front offloading sandal when walking, minimal weight bearing right foot Laboratory ordered were: Anaerobic culture - right great toe WOUND #8: - T Great Wound Laterality: Right oe Cleanser: Normal Saline (Generic) 1 x Per Day/30 Days Discharge Instructions: Cleanse the wound with Normal Saline prior to applying [name] clean dressing using gauze sponges, not tissue or cotton balls. Cleanser: Soap and Water 1 x Per Day/30 Days Discharge Instructions: May shower and wash wound with dial antibacterial soap and water prior to  dressing change. Prim Dressing: Maxorb Extra Ag+ Alginate Dressing, 4x4.75 (in/in) 1 x Per Day/30 Days ary Discharge Instructions: Apply to wound bed as instructed Secondary Dressing: Optifoam Non-Adhesive Dressing, 4x4 in 1 x Per Day/30 Days Discharge Instructions: Apply over primary dressing cut to make donut Secondary Dressing: Woven Gauze Sponges 2x2 in (Generic) 1 x Per Day/30 Days Discharge Instructions: Apply over primary dressing as directed. Secured With: Insurance underwriter, Sterile 2x75 (in/in) 1 x Per Day/30 Days Discharge Instructions: Secure with stretch gauze as directed. 06/26/2023: Unfortunately, there has been massive breakdown of his wound. Although the dressing did not have any drainage on it, there was extensive fluid and blood that collected underneath the periwound callus that has resulted in substantial deterioration. The bone remains covered with soft tissue, but the wound is markedly larger. There appears to be some pressure-induced tissue injury on the weightbearing aspect of the toe. I used scissors and forceps to cut away any of the hyperkeratotic skin and callus to expose the entire wound bed. It is beefy red and appears quite healthy, despite the breakdown. The most plantar aspect is [name] little bit purpleish, but not markedly so. I did decide to take [name] culture to see if infection played any contributing role in the wound breakdown, but the wound does not actually appear infected. I think the likely culprit is the patient's activity level and lack of appropriate offloading. We will apply silver alginate today and I have asked him to go into his forefoot offloading shoe, which she is not wearing today. I emphasized the importance of staying off of his feet, though he claims his activity level was no different this week than it has been previous weeks. Once culture data return I will make therapeutic recommendations as indicated. Follow-up in 1  week. Electronic Signature(s) Signed: 06/26/2023 9:24:05 AM By: [name] MD FACS Entered By: [name] on 06/26/2023 06:24:04 -------------------------------------------------------------------------------- HxROS Details Patient Name: Date of Service: [name] [name], [name] [name] 06/26/2023 8:45 [name] M Medical Record Number: [id number] Patient Account Number: [id number] Date of Birth/Sex: Treating RN: 1951-03-06 (72 y.o. M) Primary Care Provider: Abbe [name] Other Clinician: Referring Provider: Treating Provider/Extender: [name] in Treatment: 16 Information Obtained From Patient Eyes Medical History: Past Medical History Notes: Diabetic retinopathy [name] [name], [name] [name] ([id number]) [id number] Page 10 of 11 Cardiovascular Medical History: Positive for: Hypertension Past Medical History Notes: Carotid artery stenosis Endocrine Medical History: Positive for: Type II Diabetes Negative for: Type I Diabetes Time with diabetes: 5 years Treated with: Insulin Blood sugar tested every day: Yes Tested : twice [name] day Musculoskeletal Medical History: Positive for: Osteoarthritis Neurologic Medical History: Positive for: Neuropathy Oncologic Medical History: Positive for: Received Radiation Past Medical History Notes: Prostate cancer Immunizations Pneumococcal Vaccine: Received Pneumococcal Vaccination: No Implantable Devices None Family and Social History Cancer: No;  Diabetes: Yes - Mother,Siblings; Heart Disease: No; Hereditary Spherocytosis: No; Hypertension: No; Kidney Disease: No; Lung Disease: No; Seizures: No; Stroke: No; Thyroid Problems: No; Tuberculosis: No; Never smoker; Marital Status - [name]; Alcohol Use: Never; Drug Use: No History; Caffeine Use: Rarely; Financial Concerns: No; Food, Clothing or Shelter Needs: No; Support System Lacking: No; Transportation Concerns: No Electronic Signature(s) Signed:  06/26/2023 11:20:44 AM By: [name] MD FACS Entered By: [name] on 06/26/2023 06:20:38 -------------------------------------------------------------------------------- SuperBill Details Patient Name: Date of Service: [name] [name] 06/26/2023 Medical Record Number: [id number] Patient Account Number: [id number] Date of Birth/Sex: Treating RN: 08/11/1951 (72 y.o. M) Primary Care Provider: Abbe [name] Other Clinician: Referring Provider: Treating Provider/Extender: [name]: 16 Diagnosis Coding ICD-10 Codes Code Description [phone] Non-pressure chronic ulcer of other part of right foot with necrosis of bone M86.171 Other acute osteomyelitis, right ankle and foot E11.621 Type 2 diabetes mellitus with foot ulcer Demarais, [name] [name] ([id number]) [id number] Page 11 of 11 I10 Essential (primary) hypertension N18.32 Chronic kidney disease, stage 3b Facility Procedures : CPT4 Code: [id number] Description: [id number] - DEBRIDE WOUND 1ST 20 SQ CM OR < ICD-10 Diagnosis Description L97.514 Non-pressure chronic ulcer of other part of right foot with necrosis of bone Modifier: Quantity: 1 Physician Procedures : CPT4 Code Description Modifier [id number] - WC PHYS LEVEL 3 - EST PT 25 ICD-10 Diagnosis Description L97.514 Non-pressure chronic ulcer of other part of right foot with necrosis of bone M86.171 Other acute osteomyelitis, right ankle and foot E11.621  Type 2 diabetes mellitus with foot ulcer I10 Essential (primary) hypertension Quantity: 1 : [id number] - WC PHYS DEBR WO ANESTH 20 SQ CM ICD-10 Diagnosis Description L97.514 Non-pressure chronic ulcer of other part of right foot with necrosis of bone Quantity: 1 Electronic Signature(s) Signed: 06/26/2023 9:24:45 AM By: [name] MD FACS Entered By: [name] on 06/26/2023 06:24:45

## 2023-06-30 ENCOUNTER — Telehealth: Payer: Self-pay | Admitting: Family Medicine

## 2023-06-30 DIAGNOSIS — Z1211 Encounter for screening for malignant neoplasm of colon: Secondary | ICD-10-CM

## 2023-06-30 NOTE — Telephone Encounter (Signed)
Winona Legato called and behalf of pt and requested for a referral to Dr. Charlott Rakes at [facility] to be placed for a colonoscopy. She asked if the referral could be placed ASAP as Dr. Bosie Clos is currently accepting new pts.

## 2023-07-02 NOTE — Telephone Encounter (Signed)
Order has been placed.

## 2023-07-02 NOTE — Telephone Encounter (Signed)
Ok

## 2023-07-03 ENCOUNTER — Encounter: Payer: Self-pay | Admitting: Family Medicine

## 2023-07-03 ENCOUNTER — Ambulatory Visit (INDEPENDENT_AMBULATORY_CARE_PROVIDER_SITE_OTHER): Payer: Medicare HMO | Admitting: Family Medicine

## 2023-07-03 ENCOUNTER — Other Ambulatory Visit: Payer: Self-pay | Admitting: Family Medicine

## 2023-07-03 VITALS — BP 142/80 | HR 70 | Temp 97.8°F | Ht 66.0 in | Wt 151.0 lb

## 2023-07-03 DIAGNOSIS — E11621 Type 2 diabetes mellitus with foot ulcer: Secondary | ICD-10-CM | POA: Insufficient documentation

## 2023-07-03 DIAGNOSIS — E1142 Type 2 diabetes mellitus with diabetic polyneuropathy: Secondary | ICD-10-CM

## 2023-07-03 DIAGNOSIS — Z794 Long term (current) use of insulin: Secondary | ICD-10-CM | POA: Diagnosis not present

## 2023-07-03 DIAGNOSIS — L97519 Non-pressure chronic ulcer of other part of right foot with unspecified severity: Secondary | ICD-10-CM

## 2023-07-03 DIAGNOSIS — I10 Essential (primary) hypertension: Secondary | ICD-10-CM

## 2023-07-03 NOTE — Progress Notes (Signed)
Established Patient Office Visit   Subjective  Patient ID: [name], male    DOB: [birth year]  Age: 72 y.o. MRN: [id number]  Chief Complaint  Patient presents with   Medical Management of Chronic Issues    Pt is a 72 yo male seen for f/u on chronic conditions.  Pt inquires about which medications he is taking.  Has pillpack.  States [facility] is going out of business and he will need to switch to [facility].  Followed by wound clinic and podiatry for diabetic ulcer on right foot.  Patient concerned about osteomyelitis returning.  Patient states blood sugar has been up and down especially during wound care hyperbaric chamber treatments.  After sessions patient states his vision is cloudy and has to wait in his car several mins before he can drive.      Past Medical History:  Diagnosis Date   CKD (chronic kidney disease)    Diabetes mellitus without complication (HCC)    Hyperlipidemia    Hypertension    Insomnia    Prostate cancer (HCC)    Past Surgical History:  Procedure Laterality Date   PROSTATE BIOPSY     Social History   Tobacco Use   Smoking status: Never   Smokeless tobacco: Never  Vaping Use   Vaping status: Never Used  Substance Use Topics   Alcohol use: No   Drug use: No   Family History  Problem Relation Age of Onset   Hypertension Mother    Colon cancer Mother    Diabetes Brother    Hypertension Daughter    Breast cancer Neg Hx    Pancreatic cancer Neg Hx    Prostate cancer Neg Hx    No Known Allergies    ROS Negative unless stated above    Objective:     BP (!) 142/80 (BP Location: Left Arm, Patient Position: Sitting, Cuff Size: Normal)   Pulse 70   Temp 97.8 F (36.6 C) (Oral)   Ht 5\' 6"  (1.676 m)   Wt 151 lb (68.5 kg)   SpO2 97%   BMI 24.37 kg/m  BP Readings from Last 3 Encounters:  07/03/23 (!) 142/80  02/12/23 (!) 188/95  02/09/23 112/61   Wt Readings from Last 3 Encounters:  07/03/23 151 lb (68.5 kg)   02/08/23 154 lb 5.2 oz (70 kg)  01/23/23 154 lb 1.6 oz (69.9 kg)      Physical Exam Constitutional:      General: He is not in acute distress.    Appearance: Normal appearance.  HENT:     Head: Normocephalic and atraumatic.     Nose: Nose normal.     Mouth/Throat:     Mouth: Mucous membranes are moist.  Cardiovascular:     Rate and Rhythm: Normal rate and regular rhythm.     Heart sounds: Murmur heard.     Systolic murmur is present with a grade of 4/6.     No gallop.  Pulmonary:     Effort: Pulmonary effort is normal. No respiratory distress.     Breath sounds: Normal breath sounds. No wheezing, rhonchi or rales.  Skin:    General: Skin is warm and dry.  Neurological:     Mental Status: He is alert and oriented to person, place, and time.      No results found for any visits on 07/03/23.    Assessment & Plan:  Malignant hypertension  Diabetic ulcer of toe of right foot associated with type  2 diabetes mellitus, unspecified ulcer stage (HCC)  Type 2 diabetes mellitus with diabetic polyneuropathy, with long-term current use of insulin (HCC)  BP much improved.  Continue Coreg 25 mg twice daily, hydralazine 50 mg twice daily, Norvasc 10 mg daily.  Lifestyle modifications encouraged.  Continue follow-up with cardiology.  Improvement in diabetic ulcer of right foot noted per review of office notes with pictures sent by wound care.  Continue follow-up with wound care and podiatry.  Offered A1c this visit.  Patient declines.  Discussed the importance of glycemic control.  Continue follow-up with Endo.  Return in about 3 months (around 10/02/2023), or if symptoms worsen or fail to improve.   Deeann Saint, MD

## 2023-07-06 ENCOUNTER — Encounter (HOSPITAL_BASED_OUTPATIENT_CLINIC_OR_DEPARTMENT_OTHER): Payer: Medicare HMO | Attending: General Surgery | Admitting: General Surgery

## 2023-07-06 ENCOUNTER — Other Ambulatory Visit (HOSPITAL_BASED_OUTPATIENT_CLINIC_OR_DEPARTMENT_OTHER): Payer: Self-pay | Admitting: Cardiovascular Disease

## 2023-07-06 DIAGNOSIS — E11621 Type 2 diabetes mellitus with foot ulcer: Secondary | ICD-10-CM | POA: Diagnosis not present

## 2023-07-06 DIAGNOSIS — L97514 Non-pressure chronic ulcer of other part of right foot with necrosis of bone: Secondary | ICD-10-CM | POA: Diagnosis not present

## 2023-07-06 DIAGNOSIS — I129 Hypertensive chronic kidney disease with stage 1 through stage 4 chronic kidney disease, or unspecified chronic kidney disease: Secondary | ICD-10-CM | POA: Diagnosis not present

## 2023-07-06 DIAGNOSIS — N1832 Chronic kidney disease, stage 3b: Secondary | ICD-10-CM | POA: Insufficient documentation

## 2023-07-06 DIAGNOSIS — M86171 Other acute osteomyelitis, right ankle and foot: Secondary | ICD-10-CM | POA: Diagnosis not present

## 2023-07-06 DIAGNOSIS — E1122 Type 2 diabetes mellitus with diabetic chronic kidney disease: Secondary | ICD-10-CM | POA: Insufficient documentation

## 2023-07-06 DIAGNOSIS — L97512 Non-pressure chronic ulcer of other part of right foot with fat layer exposed: Secondary | ICD-10-CM | POA: Diagnosis not present

## 2023-07-06 NOTE — Progress Notes (Signed)
[name], [name] ([id number]) [id number] Page 1 of 11 Visit Report for 07/06/2023 Chief Complaint Document Details Patient Name: Date of Service: [name] 07/06/2023 3:30 PM Medical Record Number: [id number] Patient Account Number: [id number] Date of Birth/Sex: Treating RN: 1951/09/01 (72 y.o. M) Primary Care Provider: [name] Other Clinician: Referring Provider: Treating Provider/Extender: [name] in Treatment: 18 Information Obtained from: Patient Chief Complaint Bilateral T Ulcers oe 03/02/2023: right great toe ulcer with osteomyelitis Electronic Signature(s) Signed: 07/06/2023 4:13:25 PM By: [name] MD FACS Entered By: [name] on 07/06/2023 13:13:25 -------------------------------------------------------------------------------- Debridement Details Patient Name: Date of Service: [name], [name] 07/06/2023 3:30 PM Medical Record Number: [id number] Patient Account Number: [id number] Date of Birth/Sex: Treating RN: 1951-06-04 (72 y.o. [name] Primary Care Provider: [name] Other Clinician: Referring Provider: Treating Provider/Extender: [name] in Treatment: 18 Debridement Performed for Assessment: Wound #8 Right T Great oe Performed By: Physician [name], MD Debridement Type: Debridement Severity of Tissue Pre Debridement: Fat layer exposed Level of Consciousness (Pre-procedure): Awake and Alert Pre-procedure Verification/Time Out Yes - 16:00 Taken: Start Time: 16:02 Percent of Wound Bed Debrided: 150% T Area Debrided (cm): otal 2.54 Tissue and other material debrided: Non-Viable, Callus, Slough, Skin: Epidermis, Slough Level: Skin/Epidermis Debridement Description: Selective/Open Wound Instrument: Curette Bleeding: Minimum Hemostasis Achieved: Pressure Procedural Pain: 0 Post Procedural Pain: 0 Response to Treatment:  Procedure was tolerated well Level of Consciousness (Post- Awake and Alert procedure): Post Debridement Measurements of Total Wound Length: (cm) 1.8 Width: (cm) 1.2 Depth: (cm) 0.2 Volume: (cm) 0.339 Character of Wound/Ulcer Post Debridement: Improved Bayles, [name] ([id number]) [id number] Page 2 of 11 Severity of Tissue Post Debridement: Fat layer exposed Post Procedure Diagnosis Same as Pre-procedure Notes scribed for Dr. Lady Gary by [name], RN Electronic Signature(s) Signed: 07/06/2023 4:36:17 PM By: [name] MD FACS Signed: 07/06/2023 4:55:31 PM By: [name] RN, BSN Entered By: [name] on 07/06/2023 13:05:28 -------------------------------------------------------------------------------- HPI Details Patient Name: Date of Service: [name], [name] 07/06/2023 3:30 PM Medical Record Number: [id number] Patient Account Number: [id number] Date of Birth/Sex: Treating RN: [birth year] (72 y.o. M) Primary Care Provider: [name] Other Clinician: Referring Provider: Treating Provider/Extender: [name] in Treatment: 18 History of Present Illness HPI Description: 09/26/2020 on evaluation today patient appears to be doing somewhat poorly in regard to his bilateral feet at this point due to issues that he is been having that developed about 2 weeks ago. He was walking home which was about [name] mile and states that he first noted the areas on the right foot beginning. These were blisters that develop. The left started Thanksgiving day when he was up standing more cooking. He did see his primary care provider who referred him to podiatry. He saw Dr. Allena Katz and Dr. Allena Katz recommended Betadine moistened gauze dressings to the wounds and to follow-up in 2 weeks. With that being said the patient subsequently was concerned about infection 2 to 3 days later as was his family member who is with him today. I believe  this was [name] sister. With that being said subsequently he ended up going to urgent care at that point he was given Diflucan and Keflex he is done with both at this time. He is could be canceling the appointment with Dr. Allena Katz they tell me at this point. His most recent hemoglobin A1c was 6.6 on November 12. His ABIs were 1.2 on the right and 1.03 on the left  and appear to be doing excellent. With that being said he does not have diabetic shoes and I feel like the shoes were likely the culprit for what led to this issue currently. There does not appear to be any signs of systemic infection nor local infection at this point which is great news. The patient does have [name] history of hypertension as well as potentially [name] recurrence of prostate cancer he is being followed by the cancer center for that at this point 10/03/2020 upon evaluation today patient appears to be doing better in regard to his toe ulcers. He has been tolerating the dressing changes without complication. Fortunately there is no signs of active infection at this time. The right great toe and fifth toe are the 2 worst out of everything here he has [name] couple that have healed on the left foot there is really nothing remaining open though he has several areas of callus on the left foot in fact 3 on the third fourth and fifth toes. That is going need to be removed today as well. 10/10/2020 upon evaluation today patient appears to be doing well in regard to his wounds. He still has wounds open on the first, second, and fifth toes of the right foot. Left foot is completely healed and the right fourth toe is also completely healed today. Overall very pleased with how things seem to be progressing. 10/17/2020 on evaluation today patient appears to be doing much better in regard to the right first, second, and fifth toes. He is making excellent progress and to be honest I am extremely pleased with where things stand today. Fortunately there is no signs  of active infection at this time. No fevers, chills, nausea, vomiting, or diarrhea. 11/07/2020 upon evaluation today patient appears to actually be doing excellent in regard to his toe ulcers. The fifth and first toe on the right foot are what is left at this point. There does not appear to be signs of infection currently which is great news. With that being said I did perform some sharp debridement today to remove some of the callus around the edges of the wound 11/14/2020 upon evaluation today patient appears to be doing excellent in regard to his toe ulcers. In fact he is almost completely healed he just has 1 area on the great toe remaining at this time and this is very small. Overall he is extremely pleased with where things stand as MI. 11/21/2020 upon evaluation today patient appears to be doing well with regard to his foot ulcer. In fact this appears to be completely healed on the great toe which was the last of the areas of concern. Fortunately there is no signs of active infection at this time. No fevers, chills, nausea, vomiting, or diarrhea. READMISSION 03/02/2023 This is [name] now 72 year old type II diabetic (last hemoglobin A1c 7.9%) who returns with [name] right great toe ulcer. On February 12, 2023, he was seen by podiatry who noted: "He has superficial wounds to the second and fourth toes of the right foot however the hallux does not straight [name] very large bulbous macerated mass of tissue including the nail plate the tuft of the toe. Once this was attempted to be debrided is noted that there was [name] foul odor no purulence no significant cellulitic process but bone was visible through the wound. There is gray necrotic tissue beneath the macerated cutis." Plain radiographs taken that day demonstrated breakdown of the distal phalanx, gas in the tissue of the toe. [name]  stat MRI was subsequently ordered. Those results are copied here: IMPRESSION: 1. Osteomyelitis of the distal phalanx great toe with  overlying ulceration and speckled gas in the soft tissues of the great toe. 2. Mild dorsal subcutaneous edema in the forefoot, cellulitis is not excluded by imaging. 3. Accentuated T2 signal dorsally over the second and third toes, conceivably from bandaging or blistering, correlate with visual REICHERT, [name] ([id number]) [id number] Page 3 of 11 inspection. 4. Small focus of metal artifact along the plantar soft tissues of the forefoot below the third metatarsal head, not visible on recent conventional radiographs, probably [name] microscopic metallic particle. He was started on Augmentin for 10-day course. He apparently requested [name] second opinion regarding the osteomyelitis and need for amputation and saw Dr. Allena Katz on May 1. Dr. Eliane Decree note is incomplete at this time so it is not clear what was discussed. I do not see any formal vascular studies in the patient's chart. ABI in clinic today was noncompressible, but he has easily palpable distal pulses. 03/09/2023 He has [name] new ulcer that has opened up on the tip of his second toe. This is limited to the fat layer. There is [name] little bit of slough on the surface. The great toe ulcer has continued presence of necrotic muscle and subcutaneous tissue with bone exposure. He is taking Augmentin and doxycycline. According to his insurance, no preapproval is necessary for hyperbaric oxygen therapy, but they cannot guarantee it will be paid for. 03/17/2023: There is [name] bit of maceration of the skin between his great and second toes, but no frank tissue breakdown. The ulcer on the tip of his second toe is [name] little bit smaller. The great toe ulcer is cleaner. He has received approval for hyperbaric oxygen therapy. 04/02/2023: The second toe is healed. The ulcer on the tip of his great toe has hypertrophic granulation tissue. Bone is still palpable in the center. There is some accumulation of callus and senescent skin around the wound  margins. We have been trying to get in touch with him to schedule him for initiation of hyperbaric oxygen therapy. He has been approved for Epicord, but we do not have 1 here for him today. 04/09/2023: The ulcer on the tip of his great toe has filled in with more granulation tissue. It does still probe to bone, but appears much healthier. He started hyperbaric oxygen therapy on Monday. We have been struggling [name] little bit with hypoglycemia during and after his treatments, but we are working on [name] resolution to that. We are going to apply Epicord no. 1 today. 04/17/2023: There has been substantial infill of granulation tissue. I am no longer able to probe to bone. He is doing well with his hyperbaric oxygen therapy and we have not experienced any further episodes of hypoglycemia. 04/23/2023: His wound is responding beautifully to both the skin substitute and hyperbaric oxygen therapy. Current measurements:1.4x2.4x0.1cm. There is just slight slough on the surface and bone is completely covered with [name] good layer of healthy tissue. 04/29/2023: The wound continues to improve. There is some periwound callus buildup as well as thin slough on the wound surface. Measurements L x W x D (cm) 1.4x1.8x0.1 Area (cm): 1.979 % Reduction in Area: 82.90% . % Reduction in Volume: 91.40% 05/07/2023: The wound is smaller again today. There is [name] little bit of callus accumulation around the margins with some slough on the surface. Excellent coverage of the bone with healthy-looking granulation tissue. Wound measurements: 0.6x1.2x0.1 05/14/2023: The wound is smaller  again today. There is callus accumulation around the edges with [name] little bit of slough on the surface. Measurements L x W x D (cm) 0.5x1.2x0.2 Area (cm): 0.471 Volume (cm) : 0.094 % Reduction in Area: 95.90% % Reduction in Volume: 95.90% 05/21/2023: He says that he was on his feet most of Saturday and his foot got wet. There is tissue breakdown secondary to  moisture on the plantar aspect of his toe. There is gray-green slough and callus accumulation. 05/28/2023: The wound looks better today. There is still some moisture related tissue breakdown that has progressed on the plantar aspect of his toe, but I think this is residual from last week, as there is no active tissue maceration. Measurements L x W x D (cm) 0.8x1x0. Area (cm): 0.628 Volume (cm) : 0.188 % Reduction in Area: 94.60% % Reduction in Volume: 91.90% 06/05/2023: The wound has improved considerably over the past week. There has been substantial epithelialization. There is some callus accumulation around the edges and light slough on the surface. 06/12/2023: The wound continues to improve. It is down to less than half [name] centimeter in greatest dimension and is completely flush with the surrounding tissue. There is some dry skin and callus accumulation around the edges with light slough on the surface. Measurements L x W x D (cm) 0.3x0.5x0.1 Area (cm): 0.118 Volume (cm) : 0.012 % Reduction in Area: 99.00% % Reduction in Volume: 99.50% 06/19/2023: The wound measured the same this week, but visually it appears smaller. There is some surrounding callus and dry skin but the surface looks robust and healthy. 06/26/2023: Unfortunately, there has been massive breakdown of his wound. Although the dressing did not have any drainage on it, there was extensive fluid and blood that collected underneath the periwound callus that has resulted in substantial deterioration. The bone remains covered with soft tissue, but the wound is markedly larger. There appears to be some pressure-induced tissue injury on the weightbearing aspect of the toe. 07/06/2023: The culture that I took at his last visit was positive for methicillin-resistant Staph aureus with tetracycline resistance. I prescribed [name] course of Bactrim and he is currently taking this. His wound is markedly improved. No evidence of pressure-induced tissue  injury. There is some callus accumulation around the wound edges and some slough on the surface. Electronic Signature(s) Signed: 07/06/2023 4:14:57 PM By: [name] MD FACS Entered By: [name] on 07/06/2023 13:14:56 Grein, [name] ([id number]) [id number] Page 4 of 11 -------------------------------------------------------------------------------- Physical Exam Details Patient Name: Date of Service: [name] 07/06/2023 3:30 PM Medical Record Number: [id number] Patient Account Number: [id number] Date of Birth/Sex: Treating RN: 1951-03-29 (72 y.o. M) Primary Care Provider: [name] Other Clinician: Referring Provider: Treating Provider/Extender: [name]: 18 Constitutional . . . . no acute distress. Respiratory Normal work of breathing on room air. Notes 07/06/2023: His wound is markedly improved. No evidence of pressure-induced tissue injury. There is some callus accumulation around the wound edges and some slough on the surface. Electronic Signature(s) Signed: 07/06/2023 4:19:14 PM By: [name] MD FACS Entered By: [name] on 07/06/2023 13:19:14 -------------------------------------------------------------------------------- Physician Orders Details Patient Name: Date of Service: [name], [name] 07/06/2023 3:30 PM Medical Record Number: [id number] Patient Account Number: [id number] Date of Birth/Sex: Treating RN: [birth year] (72 y.o. [name] Primary Care Provider: [name] Other Clinician: Referring Provider: Treating Provider/Extender: [name] in Treatment: 18 The following information was scribed by: [name] The information  was scribed for: [name] Verbal / Phone Orders: No Diagnosis Coding ICD-10 Coding Code Description L97.514 Non-pressure chronic ulcer of other part of right foot with necrosis of  bone M86.171 Other acute osteomyelitis, right ankle and foot E11.621 Type 2 diabetes mellitus with foot ulcer I10 Essential (primary) hypertension N18.32 Chronic kidney disease, stage 3b Follow-up Appointments ppointment in 1 week. - Dr Lady Gary Rm 1 Return [name] Monday 9/16 @2 :00 pm Anesthetic (In clinic) Topical Lidocaine 4% applied to wound bed Bathing/ Shower/ Hygiene May shower with protection but do not get wound dressing(s) wet. Protect dressing(s) with water repellant cover (for example, large plastic bag) or [name] cast cover and may then take shower. Edema Control - Lymphedema / SCD / Other Bilateral Lower Extremities Avoid standing for long periods of time. [name], [name] ([id number]) [id number] Page 5 of 11 Exercise regularly Moisturize legs daily. Off-Loading Wound #8 Right T Great oe Other: - Wear the [employer] front offloading sandal when walking, minimal weight bearing right foot Wound Treatment Wound #8 - T Great oe Wound Laterality: Right Cleanser: Normal Saline (Generic) 1 x Per Day/30 Days Discharge Instructions: Cleanse the wound with Normal Saline prior to applying [name] clean dressing using gauze sponges, not tissue or cotton balls. Cleanser: Soap and Water 1 x Per Day/30 Days Discharge Instructions: May shower and wash wound with dial antibacterial soap and water prior to dressing change. Prim Dressing: Maxorb Extra Ag+ Alginate Dressing, 4x4.75 (in/in) 1 x Per Day/30 Days ary Discharge Instructions: Apply to wound bed as instructed Secondary Dressing: Optifoam Non-Adhesive Dressing, 4x4 in 1 x Per Day/30 Days Discharge Instructions: Apply over primary dressing cut to make donut Secondary Dressing: Woven Gauze Sponges 2x2 in (Generic) 1 x Per Day/30 Days Discharge Instructions: Apply over primary dressing as directed. Secured With: Insurance underwriter, Sterile 2x75 (in/in) 1 x Per Day/30 Days Discharge Instructions: Secure with  stretch gauze as directed. Electronic Signature(s) Unsigned Previous Signature: 07/06/2023 4:36:17 PM Version By: [name] MD FACS Entered By: [name] on 07/06/2023 13:56:25 -------------------------------------------------------------------------------- Problem List Details Patient Name: Date of Service: [name], [name] 07/06/2023 3:30 PM Medical Record Number: [id number] Patient Account Number: [id number] Date of Birth/Sex: Treating RN: Oct 01, 1951 (73 y.o. [name] Primary Care Provider: [name] Other Clinician: Referring Provider: Treating Provider/Extender: [name] in Treatment: 18 Active Problems ICD-10 Encounter Code Description Active Date MDM Diagnosis L97.514 Non-pressure chronic ulcer of other part of right foot with necrosis of bone 03/02/2023 No Yes M86.171 Other acute osteomyelitis, right ankle and foot 03/02/2023 No Yes E11.621 Type 2 diabetes mellitus with foot ulcer 03/02/2023 No Yes I10 Essential (primary) hypertension 03/02/2023 No Yes N18.32 Chronic kidney disease, stage 3b 03/02/2023 No Yes Meyn, [name] ([id number]) [id number] Page 6 of 11 Inactive Problems Resolved Problems ICD-10 Code Description Active Date Resolved Date L97.512 Non-pressure chronic ulcer of other part of right foot with fat layer exposed 03/09/2023 03/09/2023 Electronic Signature(s) Signed: 07/06/2023 4:13:11 PM By: [name] MD FACS Entered By: [name] on 07/06/2023 13:13:11 -------------------------------------------------------------------------------- Progress Note Details Patient Name: Date of Service: [name], [name] 07/06/2023 3:30 PM Medical Record Number: [id number] Patient Account Number: [id number] Date of Birth/Sex: Treating RN: 1951/09/26 (72 y.o. M) Primary Care Provider: [name] Other Clinician: Referring Provider: Treating Provider/Extender: [name] in Treatment: 18 Subjective Chief Complaint Information obtained from Patient Bilateral T Ulcers 03/02/2023: right great toe ulcer with osteomyelitis oe History of Present Illness (HPI) 09/26/2020 on evaluation  today patient appears to be doing somewhat poorly in regard to his bilateral feet at this point due to issues that he is been having that developed about 2 weeks ago. He was walking home which was about [name] mile and states that he first noted the areas on the right foot beginning. These were blisters that develop. The left started Thanksgiving day when he was up standing more cooking. He did see his primary care provider who referred him to podiatry. He saw Dr. Allena Katz and Dr. Allena Katz recommended Betadine moistened gauze dressings to the wounds and to follow-up in 2 weeks. With that being said the patient subsequently was concerned about infection 2 to 3 days later as was his family member who is with him today. I believe this was [name] sister. With that being said subsequently he ended up going to urgent care at that point he was given Diflucan and Keflex he is done with both at this time. He is could be canceling the appointment with Dr. Allena Katz they tell me at this point. His most recent hemoglobin A1c was 6.6 on November 12. His ABIs were 1.2 on the right and 1.03 on the left and appear to be doing excellent. With that being said he does not have diabetic shoes and I feel like the shoes were likely the culprit for what led to this issue currently. There does not appear to be any signs of systemic infection nor local infection at this point which is great news. The patient does have [name] history of hypertension as well as potentially [name] recurrence of prostate cancer he is being followed by the cancer center for that at this point 10/03/2020 upon evaluation today patient appears to be doing better in regard to his toe ulcers. He has been tolerating the dressing changes without complication.  Fortunately there is no signs of active infection at this time. The right great toe and fifth toe are the 2 worst out of everything here he has [name] couple that have healed on the left foot there is really nothing remaining open though he has several areas of callus on the left foot in fact 3 on the third fourth and fifth toes. That is going need to be removed today as well. 10/10/2020 upon evaluation today patient appears to be doing well in regard to his wounds. He still has wounds open on the first, second, and fifth toes of the right foot. Left foot is completely healed and the right fourth toe is also completely healed today. Overall very pleased with how things seem to be progressing. 10/17/2020 on evaluation today patient appears to be doing much better in regard to the right first, second, and fifth toes. He is making excellent progress and to be honest I am extremely pleased with where things stand today. Fortunately there is no signs of active infection at this time. No fevers, chills, nausea, vomiting, or diarrhea. 11/07/2020 upon evaluation today patient appears to actually be doing excellent in regard to his toe ulcers. The fifth and first toe on the right foot are what is left at this point. There does not appear to be signs of infection currently which is great news. With that being said I did perform some sharp debridement today to remove some of the callus around the edges of the wound 11/14/2020 upon evaluation today patient appears to be doing excellent in regard to his toe ulcers. In fact he is almost completely healed he just has 1 area on the great  toe remaining at this time and this is very small. Overall he is extremely pleased with where things stand as MI. 11/21/2020 upon evaluation today patient appears to be doing well with regard to his foot ulcer. In fact this appears to be completely healed on the great toe which was the last of the areas of concern. Fortunately there is no  signs of active infection at this time. No fevers, chills, nausea, vomiting, or diarrhea. READMISSION 03/02/2023 This is [name] now 72 year old type II diabetic (last hemoglobin A1c 7.9%) who returns with [name] right great toe ulcer. On February 12, 2023, he was seen by podiatry who noted: "He has superficial wounds to the second and fourth toes of the right foot however the hallux does not straight [name] very large bulbous macerated mass of tissue including the nail plate the tuft of the toe. Once this was attempted to be debrided is noted that there was [name] foul odor no purulence no significant cellulitic process but bone was visible through the wound. There is gray necrotic tissue beneath the macerated cutis." Plain radiographs taken that day demonstrated breakdown of the distal phalanx, gas in the tissue of the toe. [name] stat MRI was subsequently ordered. Those [name], [name] ([id number]) [id number] Page 7 of 11 results are copied here: IMPRESSION: 1. Osteomyelitis of the distal phalanx great toe with overlying ulceration and speckled gas in the soft tissues of the great toe. 2. Mild dorsal subcutaneous edema in the forefoot, cellulitis is not excluded by imaging. 3. Accentuated T2 signal dorsally over the second and third toes, conceivably from bandaging or blistering, correlate with visual inspection. 4. Small focus of metal artifact along the plantar soft tissues of the forefoot below the third metatarsal head, not visible on recent conventional radiographs, probably [name] microscopic metallic particle. He was started on Augmentin for 10-day course. He apparently requested [name] second opinion regarding the osteomyelitis and need for amputation and saw Dr. Allena Katz on May 1. Dr. Eliane Decree note is incomplete at this time so it is not clear what was discussed. I do not see any formal vascular studies in the patient's chart. ABI in clinic today was noncompressible, but he has easily palpable  distal pulses. 03/09/2023 He has [name] new ulcer that has opened up on the tip of his second toe. This is limited to the fat layer. There is [name] little bit of slough on the surface. The great toe ulcer has continued presence of necrotic muscle and subcutaneous tissue with bone exposure. He is taking Augmentin and doxycycline. According to his insurance, no preapproval is necessary for hyperbaric oxygen therapy, but they cannot guarantee it will be paid for. 03/17/2023: There is [name] bit of maceration of the skin between his great and second toes, but no frank tissue breakdown. The ulcer on the tip of his second toe is [name] little bit smaller. The great toe ulcer is cleaner. He has received approval for hyperbaric oxygen therapy. 04/02/2023: The second toe is healed. The ulcer on the tip of his great toe has hypertrophic granulation tissue. Bone is still palpable in the center. There is some accumulation of callus and senescent skin around the wound margins. We have been trying to get in touch with him to schedule him for initiation of hyperbaric oxygen therapy. He has been approved for Epicord, but we do not have 1 here for him today. 04/09/2023: The ulcer on the tip of his great toe has filled in with more granulation tissue. It does still probe to  bone, but appears much healthier. He started hyperbaric oxygen therapy on Monday. We have been struggling [name] little bit with hypoglycemia during and after his treatments, but we are working on [name] resolution to that. We are going to apply Epicord no. 1 today. 04/17/2023: There has been substantial infill of granulation tissue. I am no longer able to probe to bone. He is doing well with his hyperbaric oxygen therapy and we have not experienced any further episodes of hypoglycemia. 04/23/2023: His wound is responding beautifully to both the skin substitute and hyperbaric oxygen therapy. Current measurements:1.4x2.4x0.1cm. There is just slight slough on the surface and bone is  completely covered with [name] good layer of healthy tissue. 04/29/2023: The wound continues to improve. There is some periwound callus buildup as well as thin slough on the wound surface. Measurements L x W x D (cm) 1.4x1.8x0.1 Area (cm): 1.979 % Reduction in Area: 82.90% . % Reduction in Volume: 91.40% 05/07/2023: The wound is smaller again today. There is [name] little bit of callus accumulation around the margins with some slough on the surface. Excellent coverage of the bone with healthy-looking granulation tissue. Wound measurements: 0.6x1.2x0.1 05/14/2023: The wound is smaller again today. There is callus accumulation around the edges with [name] little bit of slough on the surface. Measurements L x W x D (cm) 0.5x1.2x0.2 Area (cm): 0.471 Volume (cm) : 0.094 % Reduction in Area: 95.90% % Reduction in Volume: 95.90% 05/21/2023: He says that he was on his feet most of Saturday and his foot got wet. There is tissue breakdown secondary to moisture on the plantar aspect of his toe. There is gray-green slough and callus accumulation. 05/28/2023: The wound looks better today. There is still some moisture related tissue breakdown that has progressed on the plantar aspect of his toe, but I think this is residual from last week, as there is no active tissue maceration. Measurements L x W x D (cm) 0.8x1x0. Area (cm): 0.628 Volume (cm) : 0.188 % Reduction in Area: 94.60% % Reduction in Volume: 91.90% 06/05/2023: The wound has improved considerably over the past week. There has been substantial epithelialization. There is some callus accumulation around the edges and light slough on the surface. 06/12/2023: The wound continues to improve. It is down to less than half [name] centimeter in greatest dimension and is completely flush with the surrounding tissue. There is some dry skin and callus accumulation around the edges with light slough on the surface. Measurements L x W x D (cm) 0.3x0.5x0.1 Area (cm): 0.118 Volume (cm)  : 0.012 % Reduction in Area: 99.00% % Reduction in Volume: 99.50% 06/19/2023: The wound measured the same this week, but visually it appears smaller. There is some surrounding callus and dry skin but the surface looks robust and healthy. 06/26/2023: Unfortunately, there has been massive breakdown of his wound. Although the dressing did not have any drainage on it, there was extensive fluid and blood that collected underneath the periwound callus that has resulted in substantial deterioration. The bone remains covered with soft tissue, but the wound is markedly larger. There appears to be some pressure-induced tissue injury on the weightbearing aspect of the toe. 07/06/2023: The culture that I took at his last visit was positive for methicillin-resistant Staph aureus with tetracycline resistance. I prescribed [name] course of Bactrim and he is currently taking this. His wound is markedly improved. No evidence of pressure-induced tissue injury. There is some callus accumulation around the wound edges and some slough on the surface. Patient History  Information obtained from Patient. JAIZON, GRAFFEO ([id number]) [id number] Page 8 of 11 Family History Diabetes - Mother,Siblings, No family history of Cancer, Heart Disease, Hereditary Spherocytosis, Hypertension, Kidney Disease, Lung Disease, Seizures, Stroke, Thyroid Problems, Tuberculosis. Social History Never smoker, Marital Status - Single, Alcohol Use - Never, Drug Use - No History, Caffeine Use - Rarely. Medical History Cardiovascular Patient has history of Hypertension Endocrine Patient has history of Type II Diabetes Denies history of Type I Diabetes Musculoskeletal Patient has history of Osteoarthritis Neurologic Patient has history of Neuropathy Oncologic Patient has history of Received Radiation Medical [name] Surgical History Notes nd Eyes Diabetic retinopathy Cardiovascular Carotid artery  stenosis Oncologic Prostate cancer Objective Constitutional no acute distress. Vitals Time Taken: 3:45 PM, Height: 67 in, Weight: 154 lbs, BMI: 24.1, Temperature: 98.4 F, Pulse: 71 bpm, Respiratory Rate: 18 breaths/min, Blood Pressure: 106/53 mmHg, Capillary Blood Glucose: 240 mg/dl. General Notes: glucose per pt report this am Respiratory Normal work of breathing on room air. General Notes: 07/06/2023: His wound is markedly improved. No evidence of pressure-induced tissue injury. There is some callus accumulation around the wound edges and some slough on the surface. Integumentary (Hair, Skin) Wound #8 status is Open. Original cause of wound was Gradually Appeared. The date acquired was: 02/04/2023. The wound has been in treatment 18 weeks. The wound is located on the Right T Great. The wound measures 1.8cm length x 1.1cm width x 0.2cm depth; 1.555cm^2 area and 0.311cm^3 volume. There is oe Fat Layer (Subcutaneous Tissue) exposed. There is no tunneling or undermining noted. There is [name] medium amount of serosanguineous drainage noted. The wound margin is distinct with the outline attached to the wound base. There is large (67-100%) red, friable granulation within the wound bed. There is [name] small (1- 33%) amount of necrotic tissue within the wound bed including Adherent Slough. The periwound skin appearance had no abnormalities noted for texture. The periwound skin appearance had no abnormalities noted for color. The periwound skin appearance did not exhibit: Dry/Scaly, Maceration. Periwound temperature was noted as No Abnormality. Assessment Active Problems ICD-10 Non-pressure chronic ulcer of other part of right foot with necrosis of bone Other acute osteomyelitis, right ankle and foot Type 2 diabetes mellitus with foot ulcer Essential (primary) hypertension Chronic kidney disease, stage 3b Procedures Wound #8 Pre-procedure diagnosis of Wound #8 is [name] Diabetic Wound/Ulcer of the Lower  Extremity located on the Right T Great .Severity of Tissue Pre Debridement isDenzale [name], [name] ([id number]) [id number] Page 9 of 11 Fat layer exposed. There was [name] Selective/Open Wound Skin/Epidermis Debridement with [name] total area of 2.54 sq cm performed by [name], MD. With the following instrument(s): Curette to remove Non-Viable tissue/material. Material removed includes Callus, Slough, and Skin: Epidermis. No specimens were taken. [name] time out was conducted at 16:00, prior to the start of the procedure. [name] Minimum amount of bleeding was controlled with Pressure. The procedure was tolerated well with [name] pain level of 0 throughout and [name] pain level of 0 following the procedure. Post Debridement Measurements: 1.8cm length x 1.2cm width x 0.2cm depth; 0.339cm^3 volume. Character of Wound/Ulcer Post Debridement is improved. Severity of Tissue Post Debridement is: Fat layer exposed. Post procedure Diagnosis Wound #8: Same as Pre-Procedure General Notes: scribed for Dr. Lady Gary by [name], RN. Plan Follow-up Appointments: Return Appointment in 1 week. - Dr Lady Gary Rm 1 Monday 9/16 @2 :00 pm Anesthetic: (In clinic) Topical Lidocaine 4% applied to wound bed Bathing/ Shower/ Hygiene: May shower with protection but do not  get wound dressing(s) wet. Protect dressing(s) with water repellant cover (for example, large plastic bag) or [name] cast cover and may then take shower. Edema Control - Lymphedema / SCD / Other: Avoid standing for long periods of time. Exercise regularly Moisturize legs daily. Off-Loading: Wound #8 Right T Great: oe Other: - Wear the [employer] front offloading sandal when walking, minimal weight bearing right foot WOUND #8: - T Great Wound Laterality: Right oe Cleanser: Normal Saline (Generic) 1 x Per Day/30 Days Discharge Instructions: Cleanse the wound with Normal Saline prior to applying [name] clean dressing using gauze sponges, not tissue or  cotton balls. Cleanser: Soap and Water 1 x Per Day/30 Days Discharge Instructions: May shower and wash wound with dial antibacterial soap and water prior to dressing change. Prim Dressing: Maxorb Extra Ag+ Alginate Dressing, 4x4.75 (in/in) 1 x Per Day/30 Days ary Discharge Instructions: Apply to wound bed as instructed Secondary Dressing: Optifoam Non-Adhesive Dressing, 4x4 in 1 x Per Day/30 Days Discharge Instructions: Apply over primary dressing cut to make donut Secondary Dressing: Woven Gauze Sponges 2x2 in (Generic) 1 x Per Day/30 Days Discharge Instructions: Apply over primary dressing as directed. Secured With: Insurance underwriter, Sterile 2x75 (in/in) 1 x Per Day/30 Days Discharge Instructions: Secure with stretch gauze as directed. 07/06/2023: His wound is markedly improved. No evidence of pressure-induced tissue injury. There is some callus accumulation around the wound edges and some slough on the surface. I used [name] curette to debride callus and slough from his wound. We will continue silver alginate and [name] foam donut. He has been much more compliant wearing his forefoot offloading shoe and staying off of ladders, etc. He will complete his course of Bactrim. Follow-up in 1 week. Electronic Signature(s) Unsigned Previous Signature: 07/06/2023 4:20:15 PM Version By: [name] MD FACS Entered By: [name] on 07/06/2023 13:56:45 -------------------------------------------------------------------------------- HxROS Details Patient Name: Date of Service: [name], [name] 07/06/2023 3:30 PM Medical Record Number: [id number] Patient Account Number: [id number] Date of Birth/Sex: Treating RN: [birth year] (72 y.o. M) Primary Care Provider: [name] Other Clinician: Referring Provider: Treating Provider/Extender: [name] in Treatment: 18 Information Obtained From Patient Eyes Medical History: [name], [name] ([id number])  [id number] Page 10 of 11 Past Medical History Notes: Diabetic retinopathy Cardiovascular Medical History: Positive for: Hypertension Past Medical History Notes: Carotid artery stenosis Endocrine Medical History: Positive for: Type II Diabetes Negative for: Type I Diabetes Time with diabetes: 5 years Treated with: Insulin Blood sugar tested every day: Yes Tested : twice [name] day Musculoskeletal Medical History: Positive for: Osteoarthritis Neurologic Medical History: Positive for: Neuropathy Oncologic Medical History: Positive for: Received Radiation Past Medical History Notes: Prostate cancer Immunizations Pneumococcal Vaccine: Received Pneumococcal Vaccination: No Implantable Devices None Family and Social History Cancer: No; Diabetes: Yes - Mother,Siblings; Heart Disease: No; Hereditary Spherocytosis: No; Hypertension: No; Kidney Disease: No; Lung Disease: No; Seizures: No; Stroke: No; Thyroid Problems: No; Tuberculosis: No; Never smoker; Marital Status - Single; Alcohol Use: Never; Drug Use: No History; Caffeine Use: Rarely; Financial Concerns: No; Food, Clothing or Shelter Needs: No; Support System Lacking: No; Transportation Concerns: No Electronic Signature(s) Signed: 07/06/2023 4:36:17 PM By: [name] MD FACS Entered By: [name] on 07/06/2023 13:18:43 -------------------------------------------------------------------------------- SuperBill Details Patient Name: Date of Service: [name], [name] 07/06/2023 Medical Record Number: [id number] Patient Account Number: [id number] Date of Birth/Sex: Treating RN: Nov 07, 1950 (72 y.o. M) Primary Care Provider: [name] Other Clinician: Referring Provider: Treating Provider/Extender: [name] Tania Ade  in Treatment: 18 Diagnosis Coding ICD-10 Codes Code Description L97.514 Non-pressure chronic ulcer of other part of right foot with necrosis of  bone Faulstich, [name] ([id number]) [id number] Page 11 of 11 M86.171 Other acute osteomyelitis, right ankle and foot E11.621 Type 2 diabetes mellitus with foot ulcer I10 Essential (primary) hypertension N18.32 Chronic kidney disease, stage 3b Facility Procedures : CPT4 Code: [id number] Description: [phone] - DEBRIDE WOUND 1ST 20 SQ CM OR < ICD-10 Diagnosis Description L97.514 Non-pressure chronic ulcer of other part of right foot with necrosis of bone Modifier: Quantity: 1 Physician Procedures : CPT4 Code Description Modifier [id number] - WC PHYS LEVEL 4 - EST PT 25 ICD-10 Diagnosis Description L97.514 Non-pressure chronic ulcer of other part of right foot with necrosis of bone E11.621 Type 2 diabetes mellitus with foot ulcer M86.171 Other  acute osteomyelitis, right ankle and foot N18.32 Chronic kidney disease, stage 3b Quantity: 1 : [id number] - WC PHYS DEBR WO ANESTH 20 SQ CM ICD-10 Diagnosis Description L97.514 Non-pressure chronic ulcer of other part of right foot with necrosis of bone Quantity: 1 Electronic Signature(s) Signed: 07/06/2023 4:21:09 PM By: [name] MD FACS Entered By: [name] on 07/06/2023 13:21:08

## 2023-07-06 NOTE — Progress Notes (Signed)
DEPIANO, [name] ([id number]) [id number] Page 1 of 7 Visit Report for 07/06/2023 Arrival Information Details Patient Name: Date of Service: [name] 07/06/2023 3:30 PM Medical Record Number: [id number] Patient Account Number: [id number] Date of Birth/Sex: Treating RN: 02-Oct-1951 (72 y.o. [name] Primary Care [name]: [name] Other Clinician: Referring [name]: Treating [name]/Extender: [name] in Treatment: 18 Visit Information History Since Last Visit Added or deleted any medications: No Patient Arrived: Ambulatory Any new allergies or adverse reactions: No Arrival Time: 15:45 Had [name] fall or experienced change in No Accompanied By: self activities of daily living that may affect Transfer Assistance: None risk of falls: Patient Identification Verified: Yes Signs or symptoms of abuse/neglect since No Secondary Verification Process Completed: Yes last visito Patient Requires Transmission-Based Precautions: No Hospitalized since last visit: No Patient Has Alerts: Yes Implantable device outside of the clinic No Patient Alerts: Patient on Blood Thinner excluding cellular tissue based products placed in the center since last visit: Has Dressing in Place as Prescribed: Yes Has Footwear/Offloading in Place as Yes Prescribed: Right: Surgical Shoe with Pressure Relief Insole Pain Present Now: No Electronic Signature(s) Signed: 07/06/2023 4:55:31 PM By: [name] RN, BSN Entered By: [name] on 07/06/2023 12:45:53 -------------------------------------------------------------------------------- Encounter Discharge Information Details Patient Name: Date of Service: [name], [name] 07/06/2023 3:30 PM Medical Record Number: [id number] Patient Account Number: [id number] Date of Birth/Sex: Treating RN: [birth year] (72 y.o. [name] Primary Care Elfreida Heggs: [name] Other  Clinician: Referring Gioia Ranes: Treating Catalia Massett/Extender: [name] in Treatment: 18 Encounter Discharge Information Items Post Procedure Vitals Discharge Condition: Stable Temperature (F): 98.4 Ambulatory Status: Ambulatory Pulse (bpm): 71 Discharge Destination: Home Respiratory Rate (breaths/min): 18 Transportation: Private Auto Blood Pressure (mmHg): 106/53 Accompanied By: self Schedule Follow-up Appointment: Yes Clinical Summary of Care: Patient Declined Electronic Signature(s) Signed: 07/06/2023 4:55:31 PM By: [name] RN, BSN Entered By: [name] on 07/06/2023 13:17:01 [name], [name] ([id number]) [id number] Page 2 of 7 -------------------------------------------------------------------------------- Lower Extremity Assessment Details Patient Name: Date of Service: [name] 07/06/2023 3:30 PM Medical Record Number: [id number] Patient Account Number: [id number] Date of Birth/Sex: Treating RN: 1951-01-02 (72 y.o. [name] Primary Care Jelisha Weed: [name] Other Clinician: Referring Adiah Guereca: Treating [name]/Extender: [name]: 18 Edema Assessment Assessed: Kyra Searles: No] Franne Forts: No] Edema: [Left: N] [Right: o] Calf Left: Right: Point of Measurement: 33 cm From Medial Instep 34.5 cm Ankle Left: Right: Point of Measurement: 10 cm From Medial Instep 21.7 cm Vascular Assessment Pulses: Dorsalis Pedis Palpable: [Right:Yes] Extremity colors, hair growth, and conditions: Extremity Color: [Right:Normal] Hair Growth on Extremity: [Right:Yes] Temperature of Extremity: [Right:Warm < 3 seconds] Electronic Signature(s) Signed: 07/06/2023 4:55:31 PM By: [name] RN, BSN Entered By: [name] on 07/06/2023 12:47:50 -------------------------------------------------------------------------------- Multi Wound Chart Details Patient Name:  Date of Service: [name], [name] 07/06/2023 3:30 PM Medical Record Number: [id number] Patient Account Number: [id number] Date of Birth/Sex: Treating RN: 03-05-1951 (72 y.o. M) Primary Care [name]: [name] Other Clinician: Referring Codie Krogh: Treating [name]/Extender: [name] in Treatment: 18 Vital Signs Height(in): 67 Capillary Blood Glucose(mg/dl): 623 Weight(lbs): 762 Pulse(bpm): 71 Body Mass Index(BMI): 24.1 Blood Pressure(mmHg): 106/53 Temperature(F): 98.4 Respiratory Rate(breaths/min): 18 [8:Photos:] [N/[name]:N/[name]] Right T Great oe N/[name] N/[name] Wound Location: Gradually Appeared N/[name] N/[name] Wounding Event: Diabetic Wound/Ulcer of the Lower N/[name] N/[name] Primary Etiology: Extremity Hypertension, Type II Diabetes, N/[name] N/[name] Comorbid History: Osteoarthritis, Neuropathy, Received Radiation 02/04/2023 N/[name] N/[name]  Date Acquired: 58 N/[name] N/[name] Weeks of Treatment: Open N/[name] N/[name] Wound Status: No N/[name] N/[name] Wound Recurrence: 1.8x1.1x0.2 N/[name] N/[name] Measurements L x W x D (cm) 1.555 N/[name] N/[name] [name] (cm) : rea 0.311 N/[name] N/[name] Volume (cm) : 86.50% N/[name] N/[name] % Reduction in [name] rea: 86.50% N/[name] N/[name] % Reduction in Volume: Grade 3 N/[name] N/[name] Classification: Medium N/[name] N/[name] Exudate [name] mount: Serosanguineous N/[name] N/[name] Exudate Type: red, brown N/[name] N/[name] Exudate Color: Distinct, outline attached N/[name] N/[name] Wound Margin: Large (67-100%) N/[name] N/[name] Granulation [name] mount: Red, Friable N/[name] N/[name] Granulation Quality: Small (1-33%) N/[name] N/[name] Necrotic [name] mount: Fat Layer (Subcutaneous Tissue): Yes N/[name] N/[name] Exposed Structures: Fascia: No Tendon: No Muscle: No Joint: No Bone: No Small (1-33%) N/[name] N/[name] Epithelialization: Debridement - Selective/Open Wound N/[name] N/[name] Debridement: Pre-procedure Verification/Time Out 16:00 N/[name] N/[name] Taken: Callus, Slough N/[name] N/[name] Tissue Debrided: Skin/Epidermis N/[name] N/[name] Level: 2.54 N/[name] N/[name] Debridement [name] (sq cm): rea Curette N/[name] N/[name] Instrument: Minimum N/[name]  N/[name] Bleeding: Pressure N/[name] N/[name] Hemostasis [name] chieved: 0 N/[name] N/[name] Procedural Pain: 0 N/[name] N/[name] Post Procedural Pain: Procedure was tolerated well N/[name] N/[name] Debridement Treatment Response: 1.8x1.2x0.2 N/[name] N/[name] Post Debridement Measurements L x W x D (cm) 0.339 N/[name] N/[name] Post Debridement Volume: (cm) Callus: No N/[name] N/[name] Periwound Skin Texture: Maceration: No N/[name] N/[name] Periwound Skin Moisture: Dry/Scaly: No No Abnormalities Noted N/[name] N/[name] Periwound Skin Color: No Abnormality N/[name] N/[name] Temperature: Debridement N/[name] N/[name] Procedures Performed: Treatment Notes Electronic Signature(s) Signed: 07/06/2023 4:13:18 PM By: [name] MD FACS Entered By: [name] on 07/06/2023 13:13:18 -------------------------------------------------------------------------------- Multi-Disciplinary Care Plan Details Patient Name: Date of Service: [name], [name] 07/06/2023 3:30 PM Medical Record Number: [id number] Patient Account Number: [id number] Date of Birth/Sex: Treating RN: 06-26-1951 (72 y.o. [name] Primary Care Makiya Jeune: [name] Other Clinician: Referring Kunaal Walkins: Treating Kjersti Dittmer/Extender: [name] [place], [place] ([id number]) [id number].pdf Page 4 of 7 Weeks in Treatment: 18 Multidisciplinary Care Plan reviewed with physician Active Inactive Nutrition Nursing Diagnoses: Imbalanced nutrition Impaired glucose control: actual or potential Potential for alteratiion in Nutrition/Potential for imbalanced nutrition Goals: Patient/caregiver will maintain therapeutic glucose control Date Initiated: 03/17/2023 Target Resolution Date: 07/24/2023 Goal Status: Active Interventions: Assess HgA1c results as ordered upon admission and as needed Assess patient nutrition upon admission and as needed per policy Treatment Activities: Patient referred to Primary Care Physician for further nutritional evaluation :  03/17/2023 Notes: Wound/Skin Impairment Nursing Diagnoses: Impaired tissue integrity Knowledge deficit related to ulceration/compromised skin integrity Goals: Patient/caregiver will verbalize understanding of skin care regimen Date Initiated: 03/17/2023 Target Resolution Date: 07/24/2023 Goal Status: Active Ulcer/skin breakdown will have [name] volume reduction of 30% by week 4 Date Initiated: 03/17/2023 Date Inactivated: 04/02/2023 Target Resolution Date: 03/31/2023 Goal Status: Met Ulcer/skin breakdown will have [name] volume reduction of 50% by week 8 Date Initiated: 04/02/2023 Date Inactivated: 05/07/2023 Target Resolution Date: 04/30/2023 Goal Status: Met Interventions: Assess patient/caregiver ability to obtain necessary supplies Assess patient/caregiver ability to perform ulcer/skin care regimen upon admission and as needed Assess ulceration(s) every visit Treatment Activities: Skin care regimen initiated : 03/17/2023 Topical wound management initiated : 03/17/2023 Notes: Electronic Signature(s) Signed: 07/06/2023 4:55:31 PM By: [name] RN, BSN Entered By: [name] on 07/06/2023 12:53:19 -------------------------------------------------------------------------------- Pain Assessment Details Patient Name: Date of Service: [name], [name] 07/06/2023 3:30 PM Medical Record Number: [id number] Patient Account Number: [id number] Date of Birth/Sex: Treating RN: 05/18/1951 (72 y.o. [name] Primary Care Rayola Everhart: [name] Other Clinician: Referring Niala Stcharles: Treating Jaquila Santelli/Extender:  [name] [name] Weeks in Treatment: 18 [place], [place] ([id number]) [id number] Page 5 of 7 Active Problems Location of Pain Severity and Description of Pain Patient Has Paino No Site Locations Rate the pain. Current Pain Level: 0 Pain Management and Medication Current Pain Management: Electronic Signature(s) Signed: 07/06/2023 4:55:31 PM  By: [name] RN, BSN Entered By: [name] on 07/06/2023 12:47:32 -------------------------------------------------------------------------------- Patient/Caregiver Education Details Patient Name: Date of Service: [name] 9/9/2024andnbsp3:30 PM Medical Record Number: [id number] Patient Account Number: [id number] Date of Birth/Gender: Treating RN: Oct 14, 1951 (72 y.o. [name] Primary Care Physician: [name] Other Clinician: Referring Physician: Treating Physician/Extender: [name] in Treatment: 18 Education Assessment Education Provided To: Patient Education Topics Provided Offloading: Methods: Explain/Verbal Responses: Reinforcements needed, State content correctly Wound/Skin Impairment: Methods: Explain/Verbal Responses: Reinforcements needed, State content correctly Electronic Signature(s) Signed: 07/06/2023 4:55:31 PM By: [name] RN, BSN Entered By: [name] on 07/06/2023 12:54:03 Gabor, [name] ([id number]) [id number] Page 6 of 7 -------------------------------------------------------------------------------- Wound Assessment Details Patient Name: Date of Service: [name] 07/06/2023 3:30 PM Medical Record Number: [id number] Patient Account Number: [id number] Date of Birth/Sex: Treating RN: 1951/07/15 (72 y.o. [name] Primary Care Cyla Haluska: [name] Other Clinician: Referring Laurelyn Terrero: Treating Havard Radigan/Extender: [name]: 18 Wound Status Wound Number: 8 Primary Diabetic Wound/Ulcer of the Lower Extremity Etiology: Wound Location: Right T Great oe Wound Open Wounding Event: Gradually Appeared Status: Date Acquired: 02/04/2023 Comorbid Hypertension, Type II Diabetes, Osteoarthritis, Neuropathy, Weeks Of Treatment: 18 History: Received Radiation Clustered Wound: No Photos Wound  Measurements Length: (cm) 1 Width: (cm) 1 Depth: (cm) 0 Area: (cm) Volume: (cm) .8 % Reduction in Area: 86.5% .1 % Reduction in Volume: 86.5% .2 Epithelialization: Small (1-33%) 1.555 Tunneling: No 0.311 Undermining: No Wound Description Classification: Grade 3 Wound Margin: Distinct, outline attached Exudate Amount: Medium Exudate Type: Serosanguineous Exudate Color: red, brown Foul Odor After Cleansing: No Slough/Fibrino Yes Wound Bed Granulation Amount: Large (67-100%) Exposed Structure Granulation Quality: Red, Friable Fascia Exposed: No Necrotic Amount: Small (1-33%) Fat Layer (Subcutaneous Tissue) Exposed: Yes Necrotic Quality: Adherent Slough Tendon Exposed: No Muscle Exposed: No Joint Exposed: No Bone Exposed: No Periwound Skin Texture Texture Color No Abnormalities Noted: Yes No Abnormalities Noted: Yes Moisture Temperature / Pain No Abnormalities Noted: No Temperature: No Abnormality Dry / Scaly: No Maceration: No Treatment Notes Wound #8 (Toe Great) Wound Laterality: Right Laquan Mazzoni, [name] ([id number]) [id number] Page 7 of 7 Normal Saline Discharge Instruction: Cleanse the wound with Normal Saline prior to applying [name] clean dressing using gauze sponges, not tissue or cotton balls. Soap and Water Discharge Instruction: May shower and wash wound with dial antibacterial soap and water prior to dressing change. Peri-Wound Care Topical Primary Dressing Maxorb Extra Ag+ Alginate Dressing, 4x4.75 (in/in) Discharge Instruction: Apply to wound bed as instructed Secondary Dressing Optifoam Non-Adhesive Dressing, 4x4 in Discharge Instruction: Apply over primary dressing cut to make donut Woven Gauze Sponges 2x2 in Discharge Instruction: Apply over primary dressing as directed. Secured With Conforming Stretch Gauze Bandage, Sterile 2x75 (in/in) Discharge Instruction: Secure with stretch gauze as directed. Compression  Wrap Compression Stockings Add-Ons Electronic Signature(s) Signed: 07/06/2023 4:55:31 PM By: [name] RN, BSN Entered By: [name] on 07/06/2023 12:51:54 -------------------------------------------------------------------------------- Vitals Details Patient Name: Date of Service: [name], [name] 07/06/2023 3:30 PM Medical Record Number: [id number] Patient Account Number: [id number] Date of Birth/Sex: Treating RN: 1950/12/21 (72 y.o. [name] Primary Care Mikhail Hallenbeck: Salomon Fick,  Carollee Herter Other Clinician: Referring Melanie Openshaw: Treating Eleah Lahaie/Extender: [name] in Treatment: 18 Vital Signs Time Taken: 15:45 Temperature (F): 98.4 Height (in): 67 Pulse (bpm): 71 Weight (lbs): 154 Respiratory Rate (breaths/min): 18 Body Mass Index (BMI): 24.1 Blood Pressure (mmHg): 106/53 Capillary Blood Glucose (mg/dl): 295 Reference Range: 80 - 120 mg / dl Notes glucose per pt report this am Electronic Signature(s) Signed: 07/06/2023 4:55:31 PM By: [name] RN, BSN Entered By: [name] on 07/06/2023 12:46:31

## 2023-07-13 ENCOUNTER — Encounter (HOSPITAL_BASED_OUTPATIENT_CLINIC_OR_DEPARTMENT_OTHER): Payer: Medicare HMO | Admitting: General Surgery

## 2023-07-13 DIAGNOSIS — N1832 Chronic kidney disease, stage 3b: Secondary | ICD-10-CM | POA: Diagnosis not present

## 2023-07-13 DIAGNOSIS — E11621 Type 2 diabetes mellitus with foot ulcer: Secondary | ICD-10-CM | POA: Diagnosis not present

## 2023-07-13 DIAGNOSIS — E1122 Type 2 diabetes mellitus with diabetic chronic kidney disease: Secondary | ICD-10-CM | POA: Diagnosis not present

## 2023-07-13 DIAGNOSIS — L97512 Non-pressure chronic ulcer of other part of right foot with fat layer exposed: Secondary | ICD-10-CM | POA: Diagnosis not present

## 2023-07-13 DIAGNOSIS — M86171 Other acute osteomyelitis, right ankle and foot: Secondary | ICD-10-CM | POA: Diagnosis not present

## 2023-07-13 DIAGNOSIS — L97514 Non-pressure chronic ulcer of other part of right foot with necrosis of bone: Secondary | ICD-10-CM | POA: Diagnosis not present

## 2023-07-13 DIAGNOSIS — I129 Hypertensive chronic kidney disease with stage 1 through stage 4 chronic kidney disease, or unspecified chronic kidney disease: Secondary | ICD-10-CM | POA: Diagnosis not present

## 2023-07-13 NOTE — Progress Notes (Signed)
[name], [name] ([id number]) [id number] Page 1 of 7 Visit Report for 07/13/2023 Arrival Information Details Patient Name: Date of Service: [name] 07/13/2023 2:00 PM Medical Record Number: [id number] Patient Account Number: [id number] Date of Birth/Sex: Treating RN: 28-Feb-1951 (72 y.o. [name] Primary Care Shikara Mcauliffe: [name] Other Clinician: Referring Tyliyah Mcmeekin: Treating Mikaelyn Arthurs/Extender: [name] in Treatment: 19 Visit Information History Since Last Visit Added or deleted any medications: No Patient Arrived: Ambulatory Any new allergies or adverse reactions: No Arrival Time: 14:05 Had [name] fall or experienced change in No Accompanied By: self activities of daily living that may affect Transfer Assistance: None risk of falls: Patient Requires Transmission-Based Precautions: No Signs or symptoms of abuse/neglect since No Patient Has Alerts: Yes last visito Patient Alerts: Patient on Blood Thinner Hospitalized since last visit: No Implantable device outside of the clinic No excluding cellular tissue based products placed in the center since last visit: Has Dressing in Place as Prescribed: Yes Has Footwear/Offloading in Place as Yes Prescribed: Right: Surgical Shoe with Pressure Relief Insole Pain Present Now: No Electronic Signature(s) Signed: 07/13/2023 4:35:53 PM By: [name] RN, BSN Entered By: [name] on 07/13/2023 11:05:42 -------------------------------------------------------------------------------- Lower Extremity Assessment Details Patient Name: Date of Service: [name], [name] 07/13/2023 2:00 PM Medical Record Number: [id number] Patient Account Number: [id number] Date of Birth/Sex: Treating RN: [birth year] (72 y.o. [name] Primary Care Ramsha Lonigro: [name] Other Clinician: Referring Valita Righter: Treating Quindarius Cabello/Extender: [name]: 19 Edema Assessment Assessed: [Left: No] Franne Forts: No] Edema: [Left: N] [Right: o] Calf Left: Right: Point of Measurement: 33 cm From Medial Instep 34.5 cm Ankle Left: Right: Point of Measurement: 10 cm From Medial Instep 21.7 cm Vascular Assessment Left: [[id number] Page 2 of 7Right:] Pulses: Dorsalis Pedis Palpable: [[id number] Page 2 of 7Yes] Extremity colors, hair growth, and conditions: Extremity Color: [[id number] Page 2 of 7Normal] Hair Growth on Extremity: [[id number] Page 2 of 7Yes] Temperature of Extremity: [[id number].pdf Page 2 of 7Warm < 3 seconds] Electronic Signature(s) Signed: 07/13/2023 4:35:53 PM By: [name] RN, BSN Entered By: [name] on 07/13/2023 11:09:11 -------------------------------------------------------------------------------- Multi Wound Chart Details Patient Name: Date of Service: [name], [name] 07/13/2023 2:00 PM Medical Record Number: [id number] Patient Account Number: [id number] Date of Birth/Sex: Treating RN: 1951/07/12 (72 y.o. [name] Primary Care Trestin Vences: [name] Other Clinician: Referring Kavir Savoca: Treating Eilis Chestnutt/Extender: [name]: 19 Vital Signs Height(in): 67 Pulse(bpm): 55 Weight(lbs): 154 Blood Pressure(mmHg): 122/71 Body Mass Index(BMI): 24.1 Temperature(F): 97.6 Respiratory Rate(breaths/min): 16 [8:Photos:] [N/[name]:N/[name]] Right T Great oe N/[name] N/[name] Wound Location: Gradually Appeared N/[name] N/[name] Wounding Event: Diabetic Wound/Ulcer of the Lower N/[name] N/[name] Primary Etiology: Extremity Hypertension, Type II Diabetes, N/[name] N/[name] Comorbid History: Osteoarthritis, Neuropathy, Received Radiation 02/04/2023 N/[name] N/[name] Date Acquired: 86 N/[name] N/[name] Weeks of Treatment: Open N/[name] N/[name] Wound Status: No N/[name] N/[name] Wound  Recurrence: 1.5x0.7x0.1 N/[name] N/[name] Measurements L x W x D (cm) 0.825 N/[name] N/[name] [name] (cm) : rea 0.082 N/[name] N/[name] Volume (cm) : 92.90% N/[name] N/[name] % Reduction in [name] rea: 96.40% N/[name] N/[name] % Reduction in Volume: Grade 3 N/[name] N/[name] Classification: Medium N/[name] N/[name] Exudate [name] mount: Serosanguineous N/[name] N/[name] Exudate Type: red, brown N/[name] N/[name] Exudate Color: Distinct, outline attached N/[name] N/[name] Wound Margin: Large (67-100%) N/[name] N/[name] Granulation [name] mount: Red, Friable N/[name] N/[name] Granulation Quality: Small (1-33%) N/[name] N/[name] Necrotic [name] mount: Fat Layer (Subcutaneous Tissue): Yes N/[name] N/[name] Exposed Structures: Fascia: No  Tendon: No Muscle: No [name], [name] ([id number]) [id number] Page 3 of 7 Joint: No Bone: No Small (1-33%) N/[name] N/[name] Epithelialization: Debridement - Selective/Open Wound N/[name] N/[name] Debridement: Pre-procedure Verification/Time Out 14:15 N/[name] N/[name] Taken: Callus N/[name] N/[name] Tissue Debrided: Skin/Epidermis N/[name] N/[name] Level: 0.91 N/[name] N/[name] Debridement [name] (sq cm): rea Curette N/[name] N/[name] Instrument: Minimum N/[name] N/[name] Bleeding: Pressure N/[name] N/[name] Hemostasis [name] chieved: 0 N/[name] N/[name] Procedural Pain: 0 N/[name] N/[name] Post Procedural Pain: Procedure was tolerated well N/[name] N/[name] Debridement Treatment Response: 1.5x0.7x0.1 N/[name] N/[name] Post Debridement Measurements L x W x D (cm) 0.082 N/[name] N/[name] Post Debridement Volume: (cm) Callus: No N/[name] N/[name] Periwound Skin Texture: Maceration: No N/[name] N/[name] Periwound Skin Moisture: Dry/Scaly: No No Abnormalities Noted N/[name] N/[name] Periwound Skin Color: No Abnormality N/[name] N/[name] Temperature: Debridement N/[name] N/[name] Procedures Performed: Treatment Notes Electronic Signature(s) Signed: 07/13/2023 2:28:36 PM By: [name] MD FACS Signed: 07/13/2023 4:35:53 PM By: [name] RN, BSN Entered By: [name] on 07/13/2023 11:28:36 -------------------------------------------------------------------------------- Multi-Disciplinary Care Plan Details Patient Name: Date  of Service: [name], [name] 07/13/2023 2:00 PM Medical Record Number: [id number] Patient Account Number: [id number] Date of Birth/Sex: Treating RN: [birth year] (72 y.o. [name] Primary Care Atalaya Zappia: [name] Other Clinician: Referring Madhav Mohon: Treating Alanta Scobey/Extender: [name] in Treatment: 19 Multidisciplinary Care Plan reviewed with physician Active Inactive Nutrition Nursing Diagnoses: Imbalanced nutrition Impaired glucose control: actual or potential Potential for alteratiion in Nutrition/Potential for imbalanced nutrition Goals: Patient/caregiver will maintain therapeutic glucose control Date Initiated: 03/17/2023 Target Resolution Date: 07/24/2023 Goal Status: Active Interventions: Assess HgA1c results as ordered upon admission and as needed Assess patient nutrition upon admission and as needed per policy Treatment Activities: Patient referred to Primary Care Physician for further nutritional evaluation : 03/17/2023 Notes: Wound/Skin Impairment [name], [name] ([id number]) [phone].pdf Page 4 of 7 Nursing Diagnoses: Impaired tissue integrity Knowledge deficit related to ulceration/compromised skin integrity Goals: Patient/caregiver will verbalize understanding of skin care regimen Date Initiated: 03/17/2023 Target Resolution Date: 07/24/2023 Goal Status: Active Ulcer/skin breakdown will have [name] volume reduction of 30% by week 4 Date Initiated: 03/17/2023 Date Inactivated: 04/02/2023 Target Resolution Date: 03/31/2023 Goal Status: Met Ulcer/skin breakdown will have [name] volume reduction of 50% by week 8 Date Initiated: 04/02/2023 Date Inactivated: 05/07/2023 Target Resolution Date: 04/30/2023 Goal Status: Met Interventions: Assess patient/caregiver ability to obtain necessary supplies Assess patient/caregiver ability to perform ulcer/skin care regimen upon admission and as needed Assess ulceration(s) every  visit Treatment Activities: Skin care regimen initiated : 03/17/2023 Topical wound management initiated : 03/17/2023 Notes: Electronic Signature(s) Signed: 07/13/2023 4:35:53 PM By: [name] RN, BSN Entered By: [name] on 07/13/2023 11:15:48 -------------------------------------------------------------------------------- Pain Assessment Details Patient Name: Date of Service: [name], [name] 07/13/2023 2:00 PM Medical Record Number: [id number] Patient Account Number: [id number] Date of Birth/Sex: Treating RN: 1951/04/08 (72 y.o. [name] Primary Care Jeanee Fabre: [name] Other Clinician: Referring Zaryia Markel: Treating Lucrecia Mcphearson/Extender: [name] in Treatment: 19 Active Problems Location of Pain Severity and Description of Pain Patient Has Paino No Site Locations Rate the pain. Current Pain Level: 0 Pain Management and Medication Current Pain Management: [name], [name] ([id number]) [id number] Page 5 of 7 Electronic Signature(s) Signed: 07/13/2023 4:35:53 PM By: [name] RN, BSN Entered By: [name] on 07/13/2023 11:08:56 -------------------------------------------------------------------------------- Patient/Caregiver Education Details Patient Name: Date of Service: [name] 9/16/2024andnbsp2:00 PM Medical Record Number: [id number] Patient Account Number: [id number] Date of Birth/Gender: Treating RN: 1951-10-18 (72 y.o. [name] Primary Care  Physician: [name] Other Clinician: Referring Physician: Treating Physician/Extender: [name] in Treatment: 51 Education Assessment Education Provided To: Patient Education Topics Provided Offloading: Methods: Explain/Verbal Responses: Reinforcements needed, State content correctly Wound/Skin Impairment: Methods: Explain/Verbal Responses: Reinforcements needed, State content  correctly Electronic Signature(s) Signed: 07/13/2023 4:35:53 PM By: [name] RN, BSN Entered By: [name] on 07/13/2023 11:16:20 -------------------------------------------------------------------------------- Wound Assessment Details Patient Name: Date of Service: [name], [name] 07/13/2023 2:00 PM Medical Record Number: [id number] Patient Account Number: [id number] Date of Birth/Sex: Treating RN: 06-17-1951 (72 y.o. [name] Primary Care Mishawn Didion: [name] Other Clinician: Referring Lakisha Peyser: Treating Lula Michaux/Extender: [name]: 19 Wound Status Wound Number: 8 Primary Diabetic Wound/Ulcer of the Lower Extremity Etiology: Wound Location: Right T Great oe Wound Open Wounding Event: Gradually Appeared Status: Date Acquired: 02/04/2023 Comorbid Hypertension, Type II Diabetes, Osteoarthritis, Neuropathy, Weeks Of Treatment: 19 History: Received Radiation Clustered Wound: No Photos [name], [name] ([id number]) [id number] Page 6 of 7 Wound Measurements Length: (cm) 1.5 Width: (cm) 0.7 Depth: (cm) 0.1 Area: (cm) 0.825 Volume: (cm) 0.082 % Reduction in Area: 92.9% % Reduction in Volume: 96.4% Epithelialization: Small (1-33%) Tunneling: No Undermining: No Wound Description Classification: Grade 3 Wound Margin: Distinct, outline attached Exudate Amount: Medium Exudate Type: Serosanguineous Exudate Color: red, brown Foul Odor After Cleansing: No Slough/Fibrino Yes Wound Bed Granulation Amount: Large (67-100%) Exposed Structure Granulation Quality: Red, Friable Fascia Exposed: No Necrotic Amount: Small (1-33%) Fat Layer (Subcutaneous Tissue) Exposed: Yes Necrotic Quality: Adherent Slough Tendon Exposed: No Muscle Exposed: No Joint Exposed: No Bone Exposed: No Periwound Skin Texture Texture Color No Abnormalities Noted: Yes No Abnormalities Noted: Yes Moisture  Temperature / Pain No Abnormalities Noted: No Temperature: No Abnormality Dry / Scaly: No Maceration: No Electronic Signature(s) Signed: 07/13/2023 4:35:53 PM By: [name] RN, BSN Entered By: [name] on 07/13/2023 11:14:04 -------------------------------------------------------------------------------- Vitals Details Patient Name: Date of Service: [name], [name] 07/13/2023 2:00 PM Medical Record Number: [id number] Patient Account Number: [id number] Date of Birth/Sex: Treating RN: [birth year] (72 y.o. [name] Primary Care Jakaya Jacobowitz: [name] Other Clinician: Referring Reid Nawrot: Treating Zyliah Schier/Extender: [name] in Treatment: 19 Vital Signs Time Taken: 14:06 Temperature (F): 97.6 Height (in): 67 Pulse (bpm): 55 Weight (lbs): 154 Respiratory Rate (breaths/min): 16 Body Mass Index (BMI): 24.1 Blood Pressure (mmHg): 122/71 Reference Range: 80 - 120 mg / dl [name], [name] ([id number]) [id number] Page 7 of 7 Electronic Signature(s) Signed: 07/13/2023 4:35:53 PM By: [name] RN, BSN Entered By: [name] on 07/13/2023 11:06:44

## 2023-07-13 NOTE — Progress Notes (Signed)
procedure. A Minimum amount of bleeding was controlled with Pressure. The procedure was tolerated well with a pain level of 0 throughout and a pain level of 0 following the procedure. Post Debridement Measurements: 1.5cm length x 0.7cm width x 0.1cm depth; 0.082cm^3 volume. Character of Wound/Ulcer Post Debridement is improved. Severity of Tissue Post Debridement is: Fat layer exposed. Post procedure Diagnosis Wound #8: Same as Pre-Procedure Plan Follow-up Appointments: Return Appointment in 1 week. - Dr Lady Gary Rm 1 Monday 9/23 @2 :45 pm Anesthetic: (In clinic) Topical Lidocaine 4% applied to wound bed Bathing/ Shower/ Hygiene: May shower with protection but do not get wound  dressing(s) wet. Protect dressing(s) with water repellant cover (for example, large plastic bag) or a cast cover and may then take shower. Edema Control - Lymphedema / SCD / Other: Avoid standing for long periods of time. Exercise regularly Moisturize legs daily. Off-Loading: Wound #8 Right T Great: oe Other: - Wear the Darco front offloading sandal when walking, minimal weight bearing right foot WOUND #8: - T Great Wound Laterality: Right oe Cleanser: Normal Saline (Generic) 1 x Per Day/30 Days Discharge Instructions: Cleanse the wound with Normal Saline prior to applying a clean dressing using gauze sponges, not tissue or cotton balls. Cleanser: Soap and Water 1 x Per Day/30 Days Discharge Instructions: May shower and wash wound with dial antibacterial soap and water prior to dressing change. Prim Dressing: Maxorb Extra Ag+ Alginate Dressing, 4x4.75 (in/in) 1 x Per Day/30 Days ary Discharge Instructions: Apply to wound bed as instructed Secondary Dressing: Optifoam Non-Adhesive Dressing, 4x4 in 1 x Per Day/30 Days Discharge Instructions: Apply over primary dressing cut to make donut Secondary Dressing: Woven Gauze Sponges 2x2 in (Generic) 1 x Per Day/30 Days Discharge Instructions: Apply over primary dressing as directed. Secured With: Insurance underwriter, Sterile 2x75 (in/in) (DME) (Generic) 1 x Per Day/30 Days Discharge Instructions: Secure with stretch gauze as directed. Secured With: 47M Medipore H Soft Cloth Surgical T ape, 4 x 10 (in/yd) (DME) (Generic) 1 x Per Day/30 Days Discharge Instructions: Secure with tape as directed. 07/13/2023: The wound continues to improve. There is a little bit of callus accumulation around the edges and the skin is starting to roll inward, but the surface is clean and the dimensions have decreased. I used a curette to debride slough, callus, and skin from the wound. We will continue silver alginate with a foam donut for offloading. He  says he is still taking the Bactrim that I prescribed for him, although he should have completed the course by now. Follow-up in 1 week. Electronic Signature(s) Signed: 07/13/2023 2:37:40 PM By: [name] MD FACS Entered By: [name] on 07/13/2023 11:37:40 -------------------------------------------------------------------------------- HxROS Details Patient Name: Date of Service: [name], [name] 07/13/2023 2:00 PM Medical Record Number: [id number] Patient Account Number: [id number] Date of Birth/Sex: Treating RN: 1951-09-10 (72 y.o. [name] Primary Care Provider: [name] Other Clinician: Referring Provider: Treating Provider/Extender: [name] in Treatment: [address] Information Obtained From Patient USMAN, DUNMORE ([id number]) [id number] Page 10 of 11 Eyes Medical History: Past Medical History Notes: Diabetic retinopathy Cardiovascular Medical History: Positive for: Hypertension Past Medical History Notes: Carotid artery stenosis Endocrine Medical History: Positive for: Type II Diabetes Negative for: Type I Diabetes Time with diabetes: 5 years Treated with: Insulin Blood sugar tested every day: Yes Tested : twice a day Musculoskeletal Medical History: Positive for: Osteoarthritis Neurologic Medical History: Positive for: Neuropathy Oncologic Medical History: Positive for: Received Radiation Past Medical  procedure. A Minimum amount of bleeding was controlled with Pressure. The procedure was tolerated well with a pain level of 0 throughout and a pain level of 0 following the procedure. Post Debridement Measurements: 1.5cm length x 0.7cm width x 0.1cm depth; 0.082cm^3 volume. Character of Wound/Ulcer Post Debridement is improved. Severity of Tissue Post Debridement is: Fat layer exposed. Post procedure Diagnosis Wound #8: Same as Pre-Procedure Plan Follow-up Appointments: Return Appointment in 1 week. - Dr Lady Gary Rm 1 Monday 9/23 @2 :45 pm Anesthetic: (In clinic) Topical Lidocaine 4% applied to wound bed Bathing/ Shower/ Hygiene: May shower with protection but do not get wound  dressing(s) wet. Protect dressing(s) with water repellant cover (for example, large plastic bag) or a cast cover and may then take shower. Edema Control - Lymphedema / SCD / Other: Avoid standing for long periods of time. Exercise regularly Moisturize legs daily. Off-Loading: Wound #8 Right T Great: oe Other: - Wear the Darco front offloading sandal when walking, minimal weight bearing right foot WOUND #8: - T Great Wound Laterality: Right oe Cleanser: Normal Saline (Generic) 1 x Per Day/30 Days Discharge Instructions: Cleanse the wound with Normal Saline prior to applying a clean dressing using gauze sponges, not tissue or cotton balls. Cleanser: Soap and Water 1 x Per Day/30 Days Discharge Instructions: May shower and wash wound with dial antibacterial soap and water prior to dressing change. Prim Dressing: Maxorb Extra Ag+ Alginate Dressing, 4x4.75 (in/in) 1 x Per Day/30 Days ary Discharge Instructions: Apply to wound bed as instructed Secondary Dressing: Optifoam Non-Adhesive Dressing, 4x4 in 1 x Per Day/30 Days Discharge Instructions: Apply over primary dressing cut to make donut Secondary Dressing: Woven Gauze Sponges 2x2 in (Generic) 1 x Per Day/30 Days Discharge Instructions: Apply over primary dressing as directed. Secured With: Insurance underwriter, Sterile 2x75 (in/in) (DME) (Generic) 1 x Per Day/30 Days Discharge Instructions: Secure with stretch gauze as directed. Secured With: 47M Medipore H Soft Cloth Surgical T ape, 4 x 10 (in/yd) (DME) (Generic) 1 x Per Day/30 Days Discharge Instructions: Secure with tape as directed. 07/13/2023: The wound continues to improve. There is a little bit of callus accumulation around the edges and the skin is starting to roll inward, but the surface is clean and the dimensions have decreased. I used a curette to debride slough, callus, and skin from the wound. We will continue silver alginate with a foam donut for offloading. He  says he is still taking the Bactrim that I prescribed for him, although he should have completed the course by now. Follow-up in 1 week. Electronic Signature(s) Signed: 07/13/2023 2:37:40 PM By: [name] MD FACS Entered By: [name] on 07/13/2023 11:37:40 -------------------------------------------------------------------------------- HxROS Details Patient Name: Date of Service: [name], [name] 07/13/2023 2:00 PM Medical Record Number: [id number] Patient Account Number: [id number] Date of Birth/Sex: Treating RN: 1951-09-10 (72 y.o. [name] Primary Care Provider: [name] Other Clinician: Referring Provider: Treating Provider/Extender: [name] in Treatment: [address] Information Obtained From Patient USMAN, DUNMORE ([id number]) [id number] Page 10 of 11 Eyes Medical History: Past Medical History Notes: Diabetic retinopathy Cardiovascular Medical History: Positive for: Hypertension Past Medical History Notes: Carotid artery stenosis Endocrine Medical History: Positive for: Type II Diabetes Negative for: Type I Diabetes Time with diabetes: 5 years Treated with: Insulin Blood sugar tested every day: Yes Tested : twice a day Musculoskeletal Medical History: Positive for: Osteoarthritis Neurologic Medical History: Positive for: Neuropathy Oncologic Medical History: Positive for: Received Radiation Past Medical  Wound measurements: 0.6x1.2x0.1 05/14/2023: The wound is smaller again today. There is callus accumulation around the edges with a little bit of slough on the surface. Measurements L x W x D (cm) 0.5x1.2x0.2 Area (cm): 0.471 Volume (cm) : 0.094 % Reduction in Area: 95.90% % Reduction in Volume: 95.90% 05/21/2023: He says that he was on his feet most of  Saturday and his foot got wet. There is tissue breakdown secondary to moisture on the plantar aspect of his toe. There is gray-green slough and callus accumulation. 05/28/2023: The wound looks better today. There is still some moisture related tissue breakdown that has progressed on the plantar aspect of his toe, but I think this is residual from last week, as there is no active tissue maceration. Measurements L x W x D (cm) 0.8x1x0. Area (cm): 0.628 Volume (cm) : 0.188 % Reduction in Area: 94.60% % Reduction in Volume: 91.90% 06/05/2023: The wound has improved considerably over the past week. There has been substantial epithelialization. There is some callus accumulation around the edges and light slough on the surface. 06/12/2023: The wound continues to improve. It is down to less than half a centimeter in greatest dimension and is completely flush with the surrounding tissue. There is some dry skin and callus accumulation around the edges with light slough on the surface. Measurements L x W x D (cm) 0.3x0.5x0.1 Area (cm): 0.118 Volume (cm) : 0.012 % Reduction in Area: 99.00% % Reduction in Volume: 99.50% 06/19/2023: The wound measured the same this week, but visually it appears smaller. There is some surrounding callus and dry skin but the surface looks robust and healthy. 06/26/2023: Unfortunately, there has been massive breakdown of his wound. Although the dressing did not have any drainage on it, there was extensive fluid and blood that collected underneath the periwound callus that has resulted in substantial deterioration. The bone remains covered with soft tissue, but the wound is markedly larger. There appears to be some pressure-induced tissue injury on the weightbearing aspect of the toe. 07/06/2023: The culture that I took at his last visit was positive for methicillin-resistant Staph aureus with tetracycline resistance. I prescribed a course of Bactrim and he is currently taking this.  His wound is markedly improved. No evidence of pressure-induced tissue injury. There is some callus accumulation around the wound edges and some slough on the surface. 07/13/2023: The wound continues to improve. There is a little bit of callus accumulation around the edges and the skin is starting to roll inward, but the surface is clean and the dimensions have decreased. Electronic Signature(s) Signed: 07/13/2023 2:32:41 PM By: [name] MD FACS Entered By: [name] on 07/13/2023 11:32:41 [name] ([id number]) [id number] Page 4 of 11 -------------------------------------------------------------------------------- Physical Exam Details Patient Name: Date of Service: [name] 07/13/2023 2:00 PM Medical Record Number: [id number] Patient Account Number: [id number] Date of Birth/Sex: Treating RN: 1950/12/10 (72 y.o. [name] Primary Care Provider: [name] Other Clinician: Referring Provider: Treating Provider/Extender: [name]: 19 Constitutional . Slightly bradycardic. . . no acute distress. Respiratory Normal work of breathing on room air. Notes 07/13/2023: The wound continues to improve. There is a little bit of callus accumulation around the edges and the skin is starting to roll inward, but the surface is clean and the dimensions have decreased. Electronic Signature(s) Signed: 07/13/2023 2:35:51 PM By: [name] MD FACS Entered By: [name] on 07/13/2023 11:35:51 -------------------------------------------------------------------------------- Physician Orders Details Patient Name: Date of Service: [name]  Wound measurements: 0.6x1.2x0.1 05/14/2023: The wound is smaller again today. There is callus accumulation around the edges with a little bit of slough on the surface. Measurements L x W x D (cm) 0.5x1.2x0.2 Area (cm): 0.471 Volume (cm) : 0.094 % Reduction in Area: 95.90% % Reduction in Volume: 95.90% 05/21/2023: He says that he was on his feet most of  Saturday and his foot got wet. There is tissue breakdown secondary to moisture on the plantar aspect of his toe. There is gray-green slough and callus accumulation. 05/28/2023: The wound looks better today. There is still some moisture related tissue breakdown that has progressed on the plantar aspect of his toe, but I think this is residual from last week, as there is no active tissue maceration. Measurements L x W x D (cm) 0.8x1x0. Area (cm): 0.628 Volume (cm) : 0.188 % Reduction in Area: 94.60% % Reduction in Volume: 91.90% 06/05/2023: The wound has improved considerably over the past week. There has been substantial epithelialization. There is some callus accumulation around the edges and light slough on the surface. 06/12/2023: The wound continues to improve. It is down to less than half a centimeter in greatest dimension and is completely flush with the surrounding tissue. There is some dry skin and callus accumulation around the edges with light slough on the surface. Measurements L x W x D (cm) 0.3x0.5x0.1 Area (cm): 0.118 Volume (cm) : 0.012 % Reduction in Area: 99.00% % Reduction in Volume: 99.50% 06/19/2023: The wound measured the same this week, but visually it appears smaller. There is some surrounding callus and dry skin but the surface looks robust and healthy. 06/26/2023: Unfortunately, there has been massive breakdown of his wound. Although the dressing did not have any drainage on it, there was extensive fluid and blood that collected underneath the periwound callus that has resulted in substantial deterioration. The bone remains covered with soft tissue, but the wound is markedly larger. There appears to be some pressure-induced tissue injury on the weightbearing aspect of the toe. 07/06/2023: The culture that I took at his last visit was positive for methicillin-resistant Staph aureus with tetracycline resistance. I prescribed a course of Bactrim and he is currently taking this.  His wound is markedly improved. No evidence of pressure-induced tissue injury. There is some callus accumulation around the wound edges and some slough on the surface. 07/13/2023: The wound continues to improve. There is a little bit of callus accumulation around the edges and the skin is starting to roll inward, but the surface is clean and the dimensions have decreased. Electronic Signature(s) Signed: 07/13/2023 2:32:41 PM By: [name] MD FACS Entered By: [name] on 07/13/2023 11:32:41 [name] ([id number]) [id number] Page 4 of 11 -------------------------------------------------------------------------------- Physical Exam Details Patient Name: Date of Service: [name] 07/13/2023 2:00 PM Medical Record Number: [id number] Patient Account Number: [id number] Date of Birth/Sex: Treating RN: 1950/12/10 (72 y.o. [name] Primary Care Provider: [name] Other Clinician: Referring Provider: Treating Provider/Extender: [name]: 19 Constitutional . Slightly bradycardic. . . no acute distress. Respiratory Normal work of breathing on room air. Notes 07/13/2023: The wound continues to improve. There is a little bit of callus accumulation around the edges and the skin is starting to roll inward, but the surface is clean and the dimensions have decreased. Electronic Signature(s) Signed: 07/13/2023 2:35:51 PM By: [name] MD FACS Entered By: [name] on 07/13/2023 11:35:51 -------------------------------------------------------------------------------- Physician Orders Details Patient Name: Date of Service: [name]  Wound measurements: 0.6x1.2x0.1 05/14/2023: The wound is smaller again today. There is callus accumulation around the edges with a little bit of slough on the surface. Measurements L x W x D (cm) 0.5x1.2x0.2 Area (cm): 0.471 Volume (cm) : 0.094 % Reduction in Area: 95.90% % Reduction in Volume: 95.90% 05/21/2023: He says that he was on his feet most of  Saturday and his foot got wet. There is tissue breakdown secondary to moisture on the plantar aspect of his toe. There is gray-green slough and callus accumulation. 05/28/2023: The wound looks better today. There is still some moisture related tissue breakdown that has progressed on the plantar aspect of his toe, but I think this is residual from last week, as there is no active tissue maceration. Measurements L x W x D (cm) 0.8x1x0. Area (cm): 0.628 Volume (cm) : 0.188 % Reduction in Area: 94.60% % Reduction in Volume: 91.90% 06/05/2023: The wound has improved considerably over the past week. There has been substantial epithelialization. There is some callus accumulation around the edges and light slough on the surface. 06/12/2023: The wound continues to improve. It is down to less than half a centimeter in greatest dimension and is completely flush with the surrounding tissue. There is some dry skin and callus accumulation around the edges with light slough on the surface. Measurements L x W x D (cm) 0.3x0.5x0.1 Area (cm): 0.118 Volume (cm) : 0.012 % Reduction in Area: 99.00% % Reduction in Volume: 99.50% 06/19/2023: The wound measured the same this week, but visually it appears smaller. There is some surrounding callus and dry skin but the surface looks robust and healthy. 06/26/2023: Unfortunately, there has been massive breakdown of his wound. Although the dressing did not have any drainage on it, there was extensive fluid and blood that collected underneath the periwound callus that has resulted in substantial deterioration. The bone remains covered with soft tissue, but the wound is markedly larger. There appears to be some pressure-induced tissue injury on the weightbearing aspect of the toe. 07/06/2023: The culture that I took at his last visit was positive for methicillin-resistant Staph aureus with tetracycline resistance. I prescribed a course of Bactrim and he is currently taking this.  His wound is markedly improved. No evidence of pressure-induced tissue injury. There is some callus accumulation around the wound edges and some slough on the surface. 07/13/2023: The wound continues to improve. There is a little bit of callus accumulation around the edges and the skin is starting to roll inward, but the surface is clean and the dimensions have decreased. Electronic Signature(s) Signed: 07/13/2023 2:32:41 PM By: [name] MD FACS Entered By: [name] on 07/13/2023 11:32:41 [name] ([id number]) [id number] Page 4 of 11 -------------------------------------------------------------------------------- Physical Exam Details Patient Name: Date of Service: [name] 07/13/2023 2:00 PM Medical Record Number: [id number] Patient Account Number: [id number] Date of Birth/Sex: Treating RN: 1950/12/10 (72 y.o. [name] Primary Care Provider: [name] Other Clinician: Referring Provider: Treating Provider/Extender: [name]: 19 Constitutional . Slightly bradycardic. . . no acute distress. Respiratory Normal work of breathing on room air. Notes 07/13/2023: The wound continues to improve. There is a little bit of callus accumulation around the edges and the skin is starting to roll inward, but the surface is clean and the dimensions have decreased. Electronic Signature(s) Signed: 07/13/2023 2:35:51 PM By: [name] MD FACS Entered By: [name] on 07/13/2023 11:35:51 -------------------------------------------------------------------------------- Physician Orders Details Patient Name: Date of Service: [name]  Wound measurements: 0.6x1.2x0.1 05/14/2023: The wound is smaller again today. There is callus accumulation around the edges with a little bit of slough on the surface. Measurements L x W x D (cm) 0.5x1.2x0.2 Area (cm): 0.471 Volume (cm) : 0.094 % Reduction in Area: 95.90% % Reduction in Volume: 95.90% 05/21/2023: He says that he was on his feet most of  Saturday and his foot got wet. There is tissue breakdown secondary to moisture on the plantar aspect of his toe. There is gray-green slough and callus accumulation. 05/28/2023: The wound looks better today. There is still some moisture related tissue breakdown that has progressed on the plantar aspect of his toe, but I think this is residual from last week, as there is no active tissue maceration. Measurements L x W x D (cm) 0.8x1x0. Area (cm): 0.628 Volume (cm) : 0.188 % Reduction in Area: 94.60% % Reduction in Volume: 91.90% 06/05/2023: The wound has improved considerably over the past week. There has been substantial epithelialization. There is some callus accumulation around the edges and light slough on the surface. 06/12/2023: The wound continues to improve. It is down to less than half a centimeter in greatest dimension and is completely flush with the surrounding tissue. There is some dry skin and callus accumulation around the edges with light slough on the surface. Measurements L x W x D (cm) 0.3x0.5x0.1 Area (cm): 0.118 Volume (cm) : 0.012 % Reduction in Area: 99.00% % Reduction in Volume: 99.50% 06/19/2023: The wound measured the same this week, but visually it appears smaller. There is some surrounding callus and dry skin but the surface looks robust and healthy. 06/26/2023: Unfortunately, there has been massive breakdown of his wound. Although the dressing did not have any drainage on it, there was extensive fluid and blood that collected underneath the periwound callus that has resulted in substantial deterioration. The bone remains covered with soft tissue, but the wound is markedly larger. There appears to be some pressure-induced tissue injury on the weightbearing aspect of the toe. 07/06/2023: The culture that I took at his last visit was positive for methicillin-resistant Staph aureus with tetracycline resistance. I prescribed a course of Bactrim and he is currently taking this.  His wound is markedly improved. No evidence of pressure-induced tissue injury. There is some callus accumulation around the wound edges and some slough on the surface. 07/13/2023: The wound continues to improve. There is a little bit of callus accumulation around the edges and the skin is starting to roll inward, but the surface is clean and the dimensions have decreased. Electronic Signature(s) Signed: 07/13/2023 2:32:41 PM By: [name] MD FACS Entered By: [name] on 07/13/2023 11:32:41 [name] ([id number]) [id number] Page 4 of 11 -------------------------------------------------------------------------------- Physical Exam Details Patient Name: Date of Service: [name] 07/13/2023 2:00 PM Medical Record Number: [id number] Patient Account Number: [id number] Date of Birth/Sex: Treating RN: 1950/12/10 (72 y.o. [name] Primary Care Provider: [name] Other Clinician: Referring Provider: Treating Provider/Extender: [name]: 19 Constitutional . Slightly bradycardic. . . no acute distress. Respiratory Normal work of breathing on room air. Notes 07/13/2023: The wound continues to improve. There is a little bit of callus accumulation around the edges and the skin is starting to roll inward, but the surface is clean and the dimensions have decreased. Electronic Signature(s) Signed: 07/13/2023 2:35:51 PM By: [name] MD FACS Entered By: [name] on 07/13/2023 11:35:51 -------------------------------------------------------------------------------- Physician Orders Details Patient Name: Date of Service: [name]  procedure. A Minimum amount of bleeding was controlled with Pressure. The procedure was tolerated well with a pain level of 0 throughout and a pain level of 0 following the procedure. Post Debridement Measurements: 1.5cm length x 0.7cm width x 0.1cm depth; 0.082cm^3 volume. Character of Wound/Ulcer Post Debridement is improved. Severity of Tissue Post Debridement is: Fat layer exposed. Post procedure Diagnosis Wound #8: Same as Pre-Procedure Plan Follow-up Appointments: Return Appointment in 1 week. - Dr Lady Gary Rm 1 Monday 9/23 @2 :45 pm Anesthetic: (In clinic) Topical Lidocaine 4% applied to wound bed Bathing/ Shower/ Hygiene: May shower with protection but do not get wound  dressing(s) wet. Protect dressing(s) with water repellant cover (for example, large plastic bag) or a cast cover and may then take shower. Edema Control - Lymphedema / SCD / Other: Avoid standing for long periods of time. Exercise regularly Moisturize legs daily. Off-Loading: Wound #8 Right T Great: oe Other: - Wear the Darco front offloading sandal when walking, minimal weight bearing right foot WOUND #8: - T Great Wound Laterality: Right oe Cleanser: Normal Saline (Generic) 1 x Per Day/30 Days Discharge Instructions: Cleanse the wound with Normal Saline prior to applying a clean dressing using gauze sponges, not tissue or cotton balls. Cleanser: Soap and Water 1 x Per Day/30 Days Discharge Instructions: May shower and wash wound with dial antibacterial soap and water prior to dressing change. Prim Dressing: Maxorb Extra Ag+ Alginate Dressing, 4x4.75 (in/in) 1 x Per Day/30 Days ary Discharge Instructions: Apply to wound bed as instructed Secondary Dressing: Optifoam Non-Adhesive Dressing, 4x4 in 1 x Per Day/30 Days Discharge Instructions: Apply over primary dressing cut to make donut Secondary Dressing: Woven Gauze Sponges 2x2 in (Generic) 1 x Per Day/30 Days Discharge Instructions: Apply over primary dressing as directed. Secured With: Insurance underwriter, Sterile 2x75 (in/in) (DME) (Generic) 1 x Per Day/30 Days Discharge Instructions: Secure with stretch gauze as directed. Secured With: 47M Medipore H Soft Cloth Surgical T ape, 4 x 10 (in/yd) (DME) (Generic) 1 x Per Day/30 Days Discharge Instructions: Secure with tape as directed. 07/13/2023: The wound continues to improve. There is a little bit of callus accumulation around the edges and the skin is starting to roll inward, but the surface is clean and the dimensions have decreased. I used a curette to debride slough, callus, and skin from the wound. We will continue silver alginate with a foam donut for offloading. He  says he is still taking the Bactrim that I prescribed for him, although he should have completed the course by now. Follow-up in 1 week. Electronic Signature(s) Signed: 07/13/2023 2:37:40 PM By: [name] MD FACS Entered By: [name] on 07/13/2023 11:37:40 -------------------------------------------------------------------------------- HxROS Details Patient Name: Date of Service: [name], [name] 07/13/2023 2:00 PM Medical Record Number: [id number] Patient Account Number: [id number] Date of Birth/Sex: Treating RN: 1951-09-10 (72 y.o. [name] Primary Care Provider: [name] Other Clinician: Referring Provider: Treating Provider/Extender: [name] in Treatment: [address] Information Obtained From Patient USMAN, DUNMORE ([id number]) [id number] Page 10 of 11 Eyes Medical History: Past Medical History Notes: Diabetic retinopathy Cardiovascular Medical History: Positive for: Hypertension Past Medical History Notes: Carotid artery stenosis Endocrine Medical History: Positive for: Type II Diabetes Negative for: Type I Diabetes Time with diabetes: 5 years Treated with: Insulin Blood sugar tested every day: Yes Tested : twice a day Musculoskeletal Medical History: Positive for: Osteoarthritis Neurologic Medical History: Positive for: Neuropathy Oncologic Medical History: Positive for: Received Radiation Past Medical  History Notes: Prostate cancer Immunizations Pneumococcal Vaccine: Received Pneumococcal Vaccination: No Implantable Devices None Family and Social History Cancer: No; Diabetes: Yes - Mother,Siblings; Heart Disease: No; Hereditary Spherocytosis: No; Hypertension: No; Kidney Disease: No; Lung Disease: No; Seizures: No; Stroke: No; Thyroid Problems: No; Tuberculosis: No; Never smoker; Marital Status - Single; Alcohol Use: Never; Drug Use: No History; Caffeine Use: Rarely;  Financial Concerns: No; Food, Clothing or Shelter Needs: No; Support System Lacking: No; Transportation Concerns: No Psychologist, prison and probation services) Signed: 07/13/2023 3:00:33 PM By: [name] MD FACS Signed: 07/13/2023 4:35:53 PM By: [name] RN, BSN Entered By: [name] on 07/13/2023 11:35:17 -------------------------------------------------------------------------------- SuperBill Details Patient Name: Date of Service: [name], [name] 07/13/2023 Medical Record Number: [id number] Patient Account Number: [id number] Date of Birth/Sex: Treating RN: 11-28-1950 (72 y.o. [name] Primary Care Provider: [name] Other Clinician: Referring Provider: Treating Provider/Extender: [name]: [address], [place] ([id number]) [id number] Page 11 of 11 ICD-10 Codes Code Description L97.514 Non-pressure chronic ulcer of other part of right foot with necrosis of bone M86.171 Other acute osteomyelitis, right ankle and foot E11.621 Type 2 diabetes mellitus with foot ulcer I10 Essential (primary) hypertension N18.32 Chronic kidney disease, stage 3b Facility Procedures : CPT4 Code: 78295621 Description: 30865 - DEBRIDE WOUND 1ST 20 SQ CM OR < ICD-10 Diagnosis Description L97.514 Non-pressure chronic ulcer of other part of right foot with necrosis of bone Modifier: Quantity: 1 Physician Procedures : CPT4 Code Description Modifier [id number] 99213 - WC PHYS LEVEL 3 - EST PT 25 ICD-10 Diagnosis Description L97.514 Non-pressure chronic ulcer of other part of right foot with necrosis of bone E11.621 Type 2 diabetes mellitus with foot ulcer M86.171 Other  acute osteomyelitis, right ankle and foot N18.32 Chronic kidney disease, stage 3b Quantity: 1 : [id number] 97597 - WC PHYS DEBR WO ANESTH 20 SQ CM ICD-10 Diagnosis Description L97.514 Non-pressure chronic ulcer of other part of right foot with  necrosis of bone Quantity: 1 Electronic Signature(s) Signed: 07/13/2023 2:38:06 PM By: [name] MD FACS Entered By: [name] on 07/13/2023 11:38:05  Wound measurements: 0.6x1.2x0.1 05/14/2023: The wound is smaller again today. There is callus accumulation around the edges with a little bit of slough on the surface. Measurements L x W x D (cm) 0.5x1.2x0.2 Area (cm): 0.471 Volume (cm) : 0.094 % Reduction in Area: 95.90% % Reduction in Volume: 95.90% 05/21/2023: He says that he was on his feet most of  Saturday and his foot got wet. There is tissue breakdown secondary to moisture on the plantar aspect of his toe. There is gray-green slough and callus accumulation. 05/28/2023: The wound looks better today. There is still some moisture related tissue breakdown that has progressed on the plantar aspect of his toe, but I think this is residual from last week, as there is no active tissue maceration. Measurements L x W x D (cm) 0.8x1x0. Area (cm): 0.628 Volume (cm) : 0.188 % Reduction in Area: 94.60% % Reduction in Volume: 91.90% 06/05/2023: The wound has improved considerably over the past week. There has been substantial epithelialization. There is some callus accumulation around the edges and light slough on the surface. 06/12/2023: The wound continues to improve. It is down to less than half a centimeter in greatest dimension and is completely flush with the surrounding tissue. There is some dry skin and callus accumulation around the edges with light slough on the surface. Measurements L x W x D (cm) 0.3x0.5x0.1 Area (cm): 0.118 Volume (cm) : 0.012 % Reduction in Area: 99.00% % Reduction in Volume: 99.50% 06/19/2023: The wound measured the same this week, but visually it appears smaller. There is some surrounding callus and dry skin but the surface looks robust and healthy. 06/26/2023: Unfortunately, there has been massive breakdown of his wound. Although the dressing did not have any drainage on it, there was extensive fluid and blood that collected underneath the periwound callus that has resulted in substantial deterioration. The bone remains covered with soft tissue, but the wound is markedly larger. There appears to be some pressure-induced tissue injury on the weightbearing aspect of the toe. 07/06/2023: The culture that I took at his last visit was positive for methicillin-resistant Staph aureus with tetracycline resistance. I prescribed a course of Bactrim and he is currently taking this.  His wound is markedly improved. No evidence of pressure-induced tissue injury. There is some callus accumulation around the wound edges and some slough on the surface. 07/13/2023: The wound continues to improve. There is a little bit of callus accumulation around the edges and the skin is starting to roll inward, but the surface is clean and the dimensions have decreased. Electronic Signature(s) Signed: 07/13/2023 2:32:41 PM By: [name] MD FACS Entered By: [name] on 07/13/2023 11:32:41 [name] ([id number]) [id number] Page 4 of 11 -------------------------------------------------------------------------------- Physical Exam Details Patient Name: Date of Service: [name] 07/13/2023 2:00 PM Medical Record Number: [id number] Patient Account Number: [id number] Date of Birth/Sex: Treating RN: 1950/12/10 (72 y.o. [name] Primary Care Provider: [name] Other Clinician: Referring Provider: Treating Provider/Extender: [name]: 19 Constitutional . Slightly bradycardic. . . no acute distress. Respiratory Normal work of breathing on room air. Notes 07/13/2023: The wound continues to improve. There is a little bit of callus accumulation around the edges and the skin is starting to roll inward, but the surface is clean and the dimensions have decreased. Electronic Signature(s) Signed: 07/13/2023 2:35:51 PM By: [name] MD FACS Entered By: [name] on 07/13/2023 11:35:51 -------------------------------------------------------------------------------- Physician Orders Details Patient Name: Date of Service: [name]  History Notes: Prostate cancer Immunizations Pneumococcal Vaccine: Received Pneumococcal Vaccination: No Implantable Devices None Family and Social History Cancer: No; Diabetes: Yes - Mother,Siblings; Heart Disease: No; Hereditary Spherocytosis: No; Hypertension: No; Kidney Disease: No; Lung Disease: No; Seizures: No; Stroke: No; Thyroid Problems: No; Tuberculosis: No; Never smoker; Marital Status - Single; Alcohol Use: Never; Drug Use: No History; Caffeine Use: Rarely;  Financial Concerns: No; Food, Clothing or Shelter Needs: No; Support System Lacking: No; Transportation Concerns: No Psychologist, prison and probation services) Signed: 07/13/2023 3:00:33 PM By: [name] MD FACS Signed: 07/13/2023 4:35:53 PM By: [name] RN, BSN Entered By: [name] on 07/13/2023 11:35:17 -------------------------------------------------------------------------------- SuperBill Details Patient Name: Date of Service: [name], [name] 07/13/2023 Medical Record Number: [id number] Patient Account Number: [id number] Date of Birth/Sex: Treating RN: 11-28-1950 (72 y.o. [name] Primary Care Provider: [name] Other Clinician: Referring Provider: Treating Provider/Extender: [name]: [address], [place] ([id number]) [id number] Page 11 of 11 ICD-10 Codes Code Description L97.514 Non-pressure chronic ulcer of other part of right foot with necrosis of bone M86.171 Other acute osteomyelitis, right ankle and foot E11.621 Type 2 diabetes mellitus with foot ulcer I10 Essential (primary) hypertension N18.32 Chronic kidney disease, stage 3b Facility Procedures : CPT4 Code: 78295621 Description: 30865 - DEBRIDE WOUND 1ST 20 SQ CM OR < ICD-10 Diagnosis Description L97.514 Non-pressure chronic ulcer of other part of right foot with necrosis of bone Modifier: Quantity: 1 Physician Procedures : CPT4 Code Description Modifier [id number] 99213 - WC PHYS LEVEL 3 - EST PT 25 ICD-10 Diagnosis Description L97.514 Non-pressure chronic ulcer of other part of right foot with necrosis of bone E11.621 Type 2 diabetes mellitus with foot ulcer M86.171 Other  acute osteomyelitis, right ankle and foot N18.32 Chronic kidney disease, stage 3b Quantity: 1 : [id number] 97597 - WC PHYS DEBR WO ANESTH 20 SQ CM ICD-10 Diagnosis Description L97.514 Non-pressure chronic ulcer of other part of right foot with  necrosis of bone Quantity: 1 Electronic Signature(s) Signed: 07/13/2023 2:38:06 PM By: [name] MD FACS Entered By: [name] on 07/13/2023 11:38:05  Wound measurements: 0.6x1.2x0.1 05/14/2023: The wound is smaller again today. There is callus accumulation around the edges with a little bit of slough on the surface. Measurements L x W x D (cm) 0.5x1.2x0.2 Area (cm): 0.471 Volume (cm) : 0.094 % Reduction in Area: 95.90% % Reduction in Volume: 95.90% 05/21/2023: He says that he was on his feet most of  Saturday and his foot got wet. There is tissue breakdown secondary to moisture on the plantar aspect of his toe. There is gray-green slough and callus accumulation. 05/28/2023: The wound looks better today. There is still some moisture related tissue breakdown that has progressed on the plantar aspect of his toe, but I think this is residual from last week, as there is no active tissue maceration. Measurements L x W x D (cm) 0.8x1x0. Area (cm): 0.628 Volume (cm) : 0.188 % Reduction in Area: 94.60% % Reduction in Volume: 91.90% 06/05/2023: The wound has improved considerably over the past week. There has been substantial epithelialization. There is some callus accumulation around the edges and light slough on the surface. 06/12/2023: The wound continues to improve. It is down to less than half a centimeter in greatest dimension and is completely flush with the surrounding tissue. There is some dry skin and callus accumulation around the edges with light slough on the surface. Measurements L x W x D (cm) 0.3x0.5x0.1 Area (cm): 0.118 Volume (cm) : 0.012 % Reduction in Area: 99.00% % Reduction in Volume: 99.50% 06/19/2023: The wound measured the same this week, but visually it appears smaller. There is some surrounding callus and dry skin but the surface looks robust and healthy. 06/26/2023: Unfortunately, there has been massive breakdown of his wound. Although the dressing did not have any drainage on it, there was extensive fluid and blood that collected underneath the periwound callus that has resulted in substantial deterioration. The bone remains covered with soft tissue, but the wound is markedly larger. There appears to be some pressure-induced tissue injury on the weightbearing aspect of the toe. 07/06/2023: The culture that I took at his last visit was positive for methicillin-resistant Staph aureus with tetracycline resistance. I prescribed a course of Bactrim and he is currently taking this.  His wound is markedly improved. No evidence of pressure-induced tissue injury. There is some callus accumulation around the wound edges and some slough on the surface. 07/13/2023: The wound continues to improve. There is a little bit of callus accumulation around the edges and the skin is starting to roll inward, but the surface is clean and the dimensions have decreased. Electronic Signature(s) Signed: 07/13/2023 2:32:41 PM By: [name] MD FACS Entered By: [name] on 07/13/2023 11:32:41 [name] ([id number]) [id number] Page 4 of 11 -------------------------------------------------------------------------------- Physical Exam Details Patient Name: Date of Service: [name] 07/13/2023 2:00 PM Medical Record Number: [id number] Patient Account Number: [id number] Date of Birth/Sex: Treating RN: 1950/12/10 (72 y.o. [name] Primary Care Provider: [name] Other Clinician: Referring Provider: Treating Provider/Extender: [name]: 19 Constitutional . Slightly bradycardic. . . no acute distress. Respiratory Normal work of breathing on room air. Notes 07/13/2023: The wound continues to improve. There is a little bit of callus accumulation around the edges and the skin is starting to roll inward, but the surface is clean and the dimensions have decreased. Electronic Signature(s) Signed: 07/13/2023 2:35:51 PM By: [name] MD FACS Entered By: [name] on 07/13/2023 11:35:51 -------------------------------------------------------------------------------- Physician Orders Details Patient Name: Date of Service: [name]

## 2023-07-14 DIAGNOSIS — L97512 Non-pressure chronic ulcer of other part of right foot with fat layer exposed: Secondary | ICD-10-CM | POA: Diagnosis not present

## 2023-07-14 DIAGNOSIS — L97522 Non-pressure chronic ulcer of other part of left foot with fat layer exposed: Secondary | ICD-10-CM | POA: Diagnosis not present

## 2023-07-14 DIAGNOSIS — E11621 Type 2 diabetes mellitus with foot ulcer: Secondary | ICD-10-CM | POA: Diagnosis not present

## 2023-07-14 DIAGNOSIS — I10 Essential (primary) hypertension: Secondary | ICD-10-CM | POA: Diagnosis not present

## 2023-07-14 DIAGNOSIS — L97514 Non-pressure chronic ulcer of other part of right foot with necrosis of bone: Secondary | ICD-10-CM | POA: Diagnosis not present

## 2023-07-20 ENCOUNTER — Encounter (HOSPITAL_BASED_OUTPATIENT_CLINIC_OR_DEPARTMENT_OTHER): Payer: Medicare HMO | Admitting: General Surgery

## 2023-07-20 DIAGNOSIS — I129 Hypertensive chronic kidney disease with stage 1 through stage 4 chronic kidney disease, or unspecified chronic kidney disease: Secondary | ICD-10-CM | POA: Diagnosis not present

## 2023-07-20 DIAGNOSIS — M86171 Other acute osteomyelitis, right ankle and foot: Secondary | ICD-10-CM | POA: Diagnosis not present

## 2023-07-20 DIAGNOSIS — L97512 Non-pressure chronic ulcer of other part of right foot with fat layer exposed: Secondary | ICD-10-CM | POA: Diagnosis not present

## 2023-07-20 DIAGNOSIS — E11621 Type 2 diabetes mellitus with foot ulcer: Secondary | ICD-10-CM | POA: Diagnosis not present

## 2023-07-20 DIAGNOSIS — E1122 Type 2 diabetes mellitus with diabetic chronic kidney disease: Secondary | ICD-10-CM | POA: Diagnosis not present

## 2023-07-20 DIAGNOSIS — L97514 Non-pressure chronic ulcer of other part of right foot with necrosis of bone: Secondary | ICD-10-CM | POA: Diagnosis not present

## 2023-07-20 DIAGNOSIS — N1832 Chronic kidney disease, stage 3b: Secondary | ICD-10-CM | POA: Diagnosis not present

## 2023-07-20 NOTE — Progress Notes (Signed)
02/04/2023 N/A N/A Date Acquired: 20 N/A N/A Weeks of Treatment: Open N/A N/A Wound Status: No N/A N/A Wound Recurrence: 0.8x0.7x0.1 N/A N/A Measurements L x W x D (cm) 0.44 N/A N/A A (cm) : rea 0.044 N/A N/A Volume (cm) : 96.20% N/A N/A % Reduction in A rea: 98.10% N/A N/A % Reduction in Volume: Grade 3 N/A N/A Classification: Medium N/A N/A Exudate A mount: Serosanguineous N/A N/A Exudate Type: red, brown N/A N/A Exudate Color: Distinct, outline attached N/A N/A Wound Margin: Large (67-100%) N/A N/A Granulation A mount: Red N/A N/A Granulation Quality: Small (1-33%) N/A N/A Necrotic A mount: Fat Layer (Subcutaneous Tissue): Yes N/A N/A Exposed Structures: Fascia: No Tendon: No Muscle: No Joint: No Bone: No Small (1-33%) N/A N/A Epithelialization: Debridement - Selective/Open Wound N/A N/A Debridement: Pre-procedure Verification/Time Out 15:20 N/A N/A Taken: Callus N/A N/A Tissue Debrided: Skin/Epidermis N/A N/A Level: 0.88 N/A N/A Debridement A (sq cm): rea Curette N/A  N/A Instrument: Minimum N/A N/A Bleeding: Pressure N/A N/A Hemostasis A chieved: 0 N/A N/A Procedural Pain: 0 N/A N/A Post Procedural Pain: Procedure was tolerated well N/A N/A Debridement Treatment Response: 0.8x0.7x0.1 N/A N/A Post Debridement Measurements L x W x D (cm) 0.044 N/A N/A Post Debridement Volume: (cm) Callus: No N/A N/A Periwound Skin Texture: Dry/Scaly: Yes N/A N/A Periwound Skin Moisture: Maceration: No No Abnormalities Noted N/A N/A Periwound Skin Color: No Abnormality N/A N/A Temperature: Debridement N/A N/A Procedures Performed: Treatment Notes Wound #8 (Toe Great) Wound Laterality: Right Cleanser Normal Saline Discharge Instruction: Cleanse the wound with Normal Saline prior to applying a clean dressing using gauze sponges, not tissue or cotton balls. Soap and Water Discharge Instruction: May shower and wash wound with dial antibacterial soap and water prior to dressing change. Peri-Wound Care Topical Primary Dressing Promogran Prisma Matrix, 4.34 (sq in) (silver collagen) Discharge Instruction: Moisten collagen with saline or hydrogel Secondary Dressing Optifoam Non-Adhesive Dressing, 4x4 in [place], [name] ([id number]) [id number] Page 4 of 8 Discharge Instruction: Apply over primary dressing cut to make donut Woven Gauze Sponges 2x2 in Discharge Instruction: Apply over primary dressing as directed. Secured With Conforming Stretch Gauze Bandage, Sterile 2x75 (in/in) Discharge Instruction: Secure with stretch gauze as directed. 41M Medipore H Soft Cloth Surgical T ape, 4 x 10 (in/yd) Discharge Instruction: Secure with tape as directed. Compression Wrap Compression Stockings Add-Ons Electronic Signature(s) Signed: 07/20/2023 3:38:23 PM By: [name] MD FACS Signed: 07/20/2023 4:32:44 PM By: [name] RN, BSN Entered By: [name] on 07/20/2023  12:38:23 -------------------------------------------------------------------------------- Multi-Disciplinary Care Plan Details Patient Name: Date of Service: [name] RD, [name] 07/20/2023 2:45 PM Medical Record Number: [id number] Patient Account Number: [id number] Date of Birth/Sex: Treating RN: [birth year] (72 y.o. [name] Primary Care Joniece Smotherman: [name] Other Clinician: Referring Sterlin Knightly: Treating Neli Fofana/Extender: [name] in Treatment: 20 Multidisciplinary Care Plan reviewed with physician Active Inactive Nutrition Nursing Diagnoses: Imbalanced nutrition Impaired glucose control: actual or potential Potential for alteratiion in Nutrition/Potential for imbalanced nutrition Goals: Patient/caregiver will maintain therapeutic glucose control Date Initiated: 03/17/2023 Target Resolution Date: 07/24/2023 Goal Status: Active Interventions: Assess HgA1c results as ordered upon admission and as needed Assess patient nutrition upon admission and as needed per policy Treatment Activities: Patient referred to Primary Care Physician for further nutritional evaluation : 03/17/2023 Notes: Wound/Skin Impairment Nursing Diagnoses: Impaired tissue integrity Knowledge deficit related to ulceration/compromised skin integrity Goals: Patient/caregiver will verbalize understanding of skin care regimen Date Initiated: 03/17/2023 Target Resolution Date: 07/24/2023 Goal Status: Active  02/04/2023 N/A N/A Date Acquired: 20 N/A N/A Weeks of Treatment: Open N/A N/A Wound Status: No N/A N/A Wound Recurrence: 0.8x0.7x0.1 N/A N/A Measurements L x W x D (cm) 0.44 N/A N/A A (cm) : rea 0.044 N/A N/A Volume (cm) : 96.20% N/A N/A % Reduction in A rea: 98.10% N/A N/A % Reduction in Volume: Grade 3 N/A N/A Classification: Medium N/A N/A Exudate A mount: Serosanguineous N/A N/A Exudate Type: red, brown N/A N/A Exudate Color: Distinct, outline attached N/A N/A Wound Margin: Large (67-100%) N/A N/A Granulation A mount: Red N/A N/A Granulation Quality: Small (1-33%) N/A N/A Necrotic A mount: Fat Layer (Subcutaneous Tissue): Yes N/A N/A Exposed Structures: Fascia: No Tendon: No Muscle: No Joint: No Bone: No Small (1-33%) N/A N/A Epithelialization: Debridement - Selective/Open Wound N/A N/A Debridement: Pre-procedure Verification/Time Out 15:20 N/A N/A Taken: Callus N/A N/A Tissue Debrided: Skin/Epidermis N/A N/A Level: 0.88 N/A N/A Debridement A (sq cm): rea Curette N/A  N/A Instrument: Minimum N/A N/A Bleeding: Pressure N/A N/A Hemostasis A chieved: 0 N/A N/A Procedural Pain: 0 N/A N/A Post Procedural Pain: Procedure was tolerated well N/A N/A Debridement Treatment Response: 0.8x0.7x0.1 N/A N/A Post Debridement Measurements L x W x D (cm) 0.044 N/A N/A Post Debridement Volume: (cm) Callus: No N/A N/A Periwound Skin Texture: Dry/Scaly: Yes N/A N/A Periwound Skin Moisture: Maceration: No No Abnormalities Noted N/A N/A Periwound Skin Color: No Abnormality N/A N/A Temperature: Debridement N/A N/A Procedures Performed: Treatment Notes Wound #8 (Toe Great) Wound Laterality: Right Cleanser Normal Saline Discharge Instruction: Cleanse the wound with Normal Saline prior to applying a clean dressing using gauze sponges, not tissue or cotton balls. Soap and Water Discharge Instruction: May shower and wash wound with dial antibacterial soap and water prior to dressing change. Peri-Wound Care Topical Primary Dressing Promogran Prisma Matrix, 4.34 (sq in) (silver collagen) Discharge Instruction: Moisten collagen with saline or hydrogel Secondary Dressing Optifoam Non-Adhesive Dressing, 4x4 in [place], [name] ([id number]) [id number] Page 4 of 8 Discharge Instruction: Apply over primary dressing cut to make donut Woven Gauze Sponges 2x2 in Discharge Instruction: Apply over primary dressing as directed. Secured With Conforming Stretch Gauze Bandage, Sterile 2x75 (in/in) Discharge Instruction: Secure with stretch gauze as directed. 41M Medipore H Soft Cloth Surgical T ape, 4 x 10 (in/yd) Discharge Instruction: Secure with tape as directed. Compression Wrap Compression Stockings Add-Ons Electronic Signature(s) Signed: 07/20/2023 3:38:23 PM By: [name] MD FACS Signed: 07/20/2023 4:32:44 PM By: [name] RN, BSN Entered By: [name] on 07/20/2023  12:38:23 -------------------------------------------------------------------------------- Multi-Disciplinary Care Plan Details Patient Name: Date of Service: [name] RD, [name] 07/20/2023 2:45 PM Medical Record Number: [id number] Patient Account Number: [id number] Date of Birth/Sex: Treating RN: [birth year] (72 y.o. [name] Primary Care Joniece Smotherman: [name] Other Clinician: Referring Sterlin Knightly: Treating Neli Fofana/Extender: [name] in Treatment: 20 Multidisciplinary Care Plan reviewed with physician Active Inactive Nutrition Nursing Diagnoses: Imbalanced nutrition Impaired glucose control: actual or potential Potential for alteratiion in Nutrition/Potential for imbalanced nutrition Goals: Patient/caregiver will maintain therapeutic glucose control Date Initiated: 03/17/2023 Target Resolution Date: 07/24/2023 Goal Status: Active Interventions: Assess HgA1c results as ordered upon admission and as needed Assess patient nutrition upon admission and as needed per policy Treatment Activities: Patient referred to Primary Care Physician for further nutritional evaluation : 03/17/2023 Notes: Wound/Skin Impairment Nursing Diagnoses: Impaired tissue integrity Knowledge deficit related to ulceration/compromised skin integrity Goals: Patient/caregiver will verbalize understanding of skin care regimen Date Initiated: 03/17/2023 Target Resolution Date: 07/24/2023 Goal Status: Active  02/04/2023 N/A N/A Date Acquired: 20 N/A N/A Weeks of Treatment: Open N/A N/A Wound Status: No N/A N/A Wound Recurrence: 0.8x0.7x0.1 N/A N/A Measurements L x W x D (cm) 0.44 N/A N/A A (cm) : rea 0.044 N/A N/A Volume (cm) : 96.20% N/A N/A % Reduction in A rea: 98.10% N/A N/A % Reduction in Volume: Grade 3 N/A N/A Classification: Medium N/A N/A Exudate A mount: Serosanguineous N/A N/A Exudate Type: red, brown N/A N/A Exudate Color: Distinct, outline attached N/A N/A Wound Margin: Large (67-100%) N/A N/A Granulation A mount: Red N/A N/A Granulation Quality: Small (1-33%) N/A N/A Necrotic A mount: Fat Layer (Subcutaneous Tissue): Yes N/A N/A Exposed Structures: Fascia: No Tendon: No Muscle: No Joint: No Bone: No Small (1-33%) N/A N/A Epithelialization: Debridement - Selective/Open Wound N/A N/A Debridement: Pre-procedure Verification/Time Out 15:20 N/A N/A Taken: Callus N/A N/A Tissue Debrided: Skin/Epidermis N/A N/A Level: 0.88 N/A N/A Debridement A (sq cm): rea Curette N/A  N/A Instrument: Minimum N/A N/A Bleeding: Pressure N/A N/A Hemostasis A chieved: 0 N/A N/A Procedural Pain: 0 N/A N/A Post Procedural Pain: Procedure was tolerated well N/A N/A Debridement Treatment Response: 0.8x0.7x0.1 N/A N/A Post Debridement Measurements L x W x D (cm) 0.044 N/A N/A Post Debridement Volume: (cm) Callus: No N/A N/A Periwound Skin Texture: Dry/Scaly: Yes N/A N/A Periwound Skin Moisture: Maceration: No No Abnormalities Noted N/A N/A Periwound Skin Color: No Abnormality N/A N/A Temperature: Debridement N/A N/A Procedures Performed: Treatment Notes Wound #8 (Toe Great) Wound Laterality: Right Cleanser Normal Saline Discharge Instruction: Cleanse the wound with Normal Saline prior to applying a clean dressing using gauze sponges, not tissue or cotton balls. Soap and Water Discharge Instruction: May shower and wash wound with dial antibacterial soap and water prior to dressing change. Peri-Wound Care Topical Primary Dressing Promogran Prisma Matrix, 4.34 (sq in) (silver collagen) Discharge Instruction: Moisten collagen with saline or hydrogel Secondary Dressing Optifoam Non-Adhesive Dressing, 4x4 in [place], [name] ([id number]) [id number] Page 4 of 8 Discharge Instruction: Apply over primary dressing cut to make donut Woven Gauze Sponges 2x2 in Discharge Instruction: Apply over primary dressing as directed. Secured With Conforming Stretch Gauze Bandage, Sterile 2x75 (in/in) Discharge Instruction: Secure with stretch gauze as directed. 41M Medipore H Soft Cloth Surgical T ape, 4 x 10 (in/yd) Discharge Instruction: Secure with tape as directed. Compression Wrap Compression Stockings Add-Ons Electronic Signature(s) Signed: 07/20/2023 3:38:23 PM By: [name] MD FACS Signed: 07/20/2023 4:32:44 PM By: [name] RN, BSN Entered By: [name] on 07/20/2023  12:38:23 -------------------------------------------------------------------------------- Multi-Disciplinary Care Plan Details Patient Name: Date of Service: [name] RD, [name] 07/20/2023 2:45 PM Medical Record Number: [id number] Patient Account Number: [id number] Date of Birth/Sex: Treating RN: [birth year] (72 y.o. [name] Primary Care Joniece Smotherman: [name] Other Clinician: Referring Sterlin Knightly: Treating Neli Fofana/Extender: [name] in Treatment: 20 Multidisciplinary Care Plan reviewed with physician Active Inactive Nutrition Nursing Diagnoses: Imbalanced nutrition Impaired glucose control: actual or potential Potential for alteratiion in Nutrition/Potential for imbalanced nutrition Goals: Patient/caregiver will maintain therapeutic glucose control Date Initiated: 03/17/2023 Target Resolution Date: 07/24/2023 Goal Status: Active Interventions: Assess HgA1c results as ordered upon admission and as needed Assess patient nutrition upon admission and as needed per policy Treatment Activities: Patient referred to Primary Care Physician for further nutritional evaluation : 03/17/2023 Notes: Wound/Skin Impairment Nursing Diagnoses: Impaired tissue integrity Knowledge deficit related to ulceration/compromised skin integrity Goals: Patient/caregiver will verbalize understanding of skin care regimen Date Initiated: 03/17/2023 Target Resolution Date: 07/24/2023 Goal Status: Active  applying a clean dressing using gauze sponges, not tissue or cotton balls. Soap and Water Discharge Instruction: May shower and wash wound with dial antibacterial soap and water prior to dressing change. Peri-Wound Care Topical Primary Dressing Promogran Prisma Matrix, 4.34 (sq in) (silver collagen) Discharge Instruction: Moisten collagen with saline or hydrogel Secondary Dressing Optifoam Non-Adhesive Dressing, 4x4 in Discharge Instruction: Apply over primary dressing cut to make donut Woven Gauze Sponges 2x2 in Discharge Instruction: Apply over primary dressing as directed. Secured With Conforming Stretch Gauze Bandage, Sterile 2x75 (in/in) Discharge Instruction: Secure with stretch gauze as directed. 27M Medipore H Soft Cloth Surgical T ape, 4 x 10 (in/yd) Discharge Instruction: Secure with tape as directed. KAIL, VISCONTI ([id number]) [id number].pdf Page 8 of 8 Compression Wrap Compression Stockings Add-Ons Electronic Signature(s) Signed: 07/20/2023 4:32:44 PM By: [name] RN, BSN Entered By: [name] on 07/20/2023 12:12:22 -------------------------------------------------------------------------------- Vitals Details Patient Name: Date of Service: [name] RD, [name] 07/20/2023 2:45 PM Medical Record Number: [id number] Patient Account Number: [id number] Date of Birth/Sex: Treating RN: 10/04/1951 (72 y.o. M) Primary Care Layla Gramm: [name] Other  Clinician: Referring Ilyanna Baillargeon: Treating Juana Haralson/Extender: [name] in Treatment: 20 Vital Signs Time Taken: 02:58 Temperature (F): 97.6 Height (in): 67 Pulse (bpm): 71 Weight (lbs): 154 Respiratory Rate (breaths/min): 18 Body Mass Index (BMI): 24.1 Blood Pressure (mmHg): 203/98 Reference Range: 80 - 120 mg / dl Notes pt states goes to see PCP tomorrow concerning BP Electronic Signature(s) Signed: 07/20/2023 4:32:44 PM By: [name] RN, BSN Entered By: [name] on 07/20/2023 12:09:50

## 2023-07-20 NOTE — Progress Notes (Signed)
of Service: [name] 07/20/2023 2:45 PM Medical Record Number: [id number] Patient Account Number: [id number] Date of Birth/Sex: Treating RN: 1950-12-28 (73 y.o. [name] Primary Care Provider: [name] Other Clinician: Referring Provider: Treating Provider/Extender: [name] in Treatment: 20 Subjective Chief Complaint Information obtained from Patient Bilateral T Ulcers 03/02/2023: right great toe ulcer with osteomyelitis oe History of Present Illness (HPI) 09/26/2020 on evaluation today patient appears to be doing somewhat poorly in regard to his bilateral feet at this point due to issues that he is been having that developed about 2 weeks ago. He was walking home which was about a mile and states that he first noted the areas on the right foot beginning. These were blisters that develop. The left started Thanksgiving day when he was up standing more cooking. He did see his primary care provider who referred him to podiatry. He saw Dr. Allena Katz and Dr. Allena Katz recommended Betadine moistened gauze dressings to the wounds and to follow-up in 2 weeks. With that being said the patient subsequently was concerned about infection 2 to 3 days later as was his family member who is with him today. I believe this was a sister. With that being said subsequently he ended up going to urgent care at that point he was given Diflucan and Keflex he is done with both at this time. He is could be canceling the appointment with Dr. Allena Katz they tell me at this point. His most recent hemoglobin A1c was 6.6 on November 12. His ABIs were 1.2 on the right and 1.03 on the left and appear to be doing excellent. With that being said he does not have diabetic shoes and I feel like the shoes were likely the culprit for what led to this issue  currently. There does not appear to be any signs of systemic infection nor local infection at this point which is great news. The patient does have a history of hypertension as well as potentially a recurrence of prostate cancer he is being followed by the cancer center for that at this point 10/03/2020 upon evaluation today patient appears to be doing better in regard to his toe ulcers. He has been tolerating the dressing changes without complication. Fortunately there is no signs of active infection at this time. The right great toe and fifth toe are the 2 worst out of everything here he has a couple that have healed on the left foot there is really nothing remaining open though he has several areas of callus on the left foot in fact 3 on the third fourth and fifth toes. That is going need to be removed today as well. 10/10/2020 upon evaluation today patient appears to be doing well in regard to his wounds. He still has wounds open on the first, second, and fifth toes of the right foot. Left foot is completely healed and the right fourth toe is also completely healed today. Overall very pleased with how things seem to be progressing. 10/17/2020 on evaluation today patient appears to be doing much better in regard to the right first, second, and fifth toes. He is making excellent progress and to be honest I am extremely pleased with where things stand today. Fortunately there is no signs of active infection at this time. No fevers, chills, nausea, vomiting, or diarrhea. 11/07/2020 upon evaluation today patient appears to actually be doing excellent in regard to his toe ulcers. The fifth and first toe on the right foot are what  tissue. Wound measurements: 0.6x1.2x0.1 05/14/2023: The wound is smaller again today. There is callus accumulation around the edges with a little bit of slough on the surface. Measurements L x W x D (cm) 0.5x1.2x0.2 Area (cm): 0.471 Volume (cm) : 0.094 % Reduction in Area: 95.90% % Reduction in Volume: 95.90% 05/21/2023: He says that he was on his feet  most of Saturday and his foot got wet. There is tissue breakdown secondary to moisture on the plantar aspect of his toe. There is gray-green slough and callus accumulation. 05/28/2023: The wound looks better today. There is still some moisture related tissue breakdown that has progressed on the plantar aspect of his toe, but I think this is residual from last week, as there is no active tissue maceration. Measurements L x W x D (cm) 0.8x1x0. Area (cm): 0.628 Volume (cm) : 0.188 % Reduction in Area: 94.60% % Reduction in Volume: 91.90% 06/05/2023: The wound has improved considerably over the past week. There has been substantial epithelialization. There is some callus accumulation around the edges and light slough on the surface. 06/12/2023: The wound continues to improve. It is down to less than half a centimeter in greatest dimension and is completely flush with the surrounding tissue. There is some dry skin and callus accumulation around the edges with light slough on the surface. Measurements L x W x D (cm) 0.3x0.5x0.1 Area (cm): 0.118 Volume (cm) : 0.012 % Reduction in Area: 99.00% % Reduction in Volume: 99.50% 06/19/2023: The wound measured the same this week, but visually it appears smaller. There is some surrounding callus and dry skin but the surface looks robust and healthy. 06/26/2023: Unfortunately, there has been massive breakdown of his wound. Although the dressing did not have any drainage on it, there was extensive fluid and blood that collected underneath the periwound callus that has resulted in substantial deterioration. The bone remains covered with soft tissue, but the wound is markedly larger. There appears to be some pressure-induced tissue injury on the weightbearing aspect of the toe. 07/06/2023: The culture that I took at his last visit was positive for methicillin-resistant Staph aureus with tetracycline resistance. I prescribed a course of Bactrim and he is currently taking  this. His wound is markedly improved. No evidence of pressure-induced tissue injury. There is some callus accumulation around the wound edges and some slough on the surface. 07/13/2023: The wound continues to improve. There is a little bit of callus accumulation around the edges and the skin is starting to roll inward, but the surface is clean and the dimensions have decreased. 07/20/2023: The wound measured smaller again today. It is fairly superficial at this point. It is a little bit on the dry side. Electronic Signature(s) Signed: 07/20/2023 3:38:59 PM By: [name] MD FACS Entered By: [name] on 07/20/2023 12:38:59 Ditmars, [name] ([id number]) [id number] Page 4 of 11 -------------------------------------------------------------------------------- Physical Exam Details Patient Name: Date of Service: [name] 07/20/2023 2:45 PM Medical Record Number: [id number] Patient Account Number: [id number] Date of Birth/Sex: Treating RN: [birth year] (72 y.o. [name] Primary Care Provider: [name] Other Clinician: Referring Provider: Treating Provider/Extender: [name]: 20 Constitutional Hypertensive, asymptomatic. . . . no acute distress. Respiratory Normal work of breathing on room air. Notes 07/20/2023: The wound measured smaller again today. It is fairly superficial at this point. It is a little bit on the dry side. Electronic Signature(s) Signed: 07/20/2023 3:39:48 PM By: [name] MD FACS Entered By: [name] on  of Service: [name] 07/20/2023 2:45 PM Medical Record Number: [id number] Patient Account Number: [id number] Date of Birth/Sex: Treating RN: 1950-12-28 (73 y.o. [name] Primary Care Provider: [name] Other Clinician: Referring Provider: Treating Provider/Extender: [name] in Treatment: 20 Subjective Chief Complaint Information obtained from Patient Bilateral T Ulcers 03/02/2023: right great toe ulcer with osteomyelitis oe History of Present Illness (HPI) 09/26/2020 on evaluation today patient appears to be doing somewhat poorly in regard to his bilateral feet at this point due to issues that he is been having that developed about 2 weeks ago. He was walking home which was about a mile and states that he first noted the areas on the right foot beginning. These were blisters that develop. The left started Thanksgiving day when he was up standing more cooking. He did see his primary care provider who referred him to podiatry. He saw Dr. Allena Katz and Dr. Allena Katz recommended Betadine moistened gauze dressings to the wounds and to follow-up in 2 weeks. With that being said the patient subsequently was concerned about infection 2 to 3 days later as was his family member who is with him today. I believe this was a sister. With that being said subsequently he ended up going to urgent care at that point he was given Diflucan and Keflex he is done with both at this time. He is could be canceling the appointment with Dr. Allena Katz they tell me at this point. His most recent hemoglobin A1c was 6.6 on November 12. His ABIs were 1.2 on the right and 1.03 on the left and appear to be doing excellent. With that being said he does not have diabetic shoes and I feel like the shoes were likely the culprit for what led to this issue  currently. There does not appear to be any signs of systemic infection nor local infection at this point which is great news. The patient does have a history of hypertension as well as potentially a recurrence of prostate cancer he is being followed by the cancer center for that at this point 10/03/2020 upon evaluation today patient appears to be doing better in regard to his toe ulcers. He has been tolerating the dressing changes without complication. Fortunately there is no signs of active infection at this time. The right great toe and fifth toe are the 2 worst out of everything here he has a couple that have healed on the left foot there is really nothing remaining open though he has several areas of callus on the left foot in fact 3 on the third fourth and fifth toes. That is going need to be removed today as well. 10/10/2020 upon evaluation today patient appears to be doing well in regard to his wounds. He still has wounds open on the first, second, and fifth toes of the right foot. Left foot is completely healed and the right fourth toe is also completely healed today. Overall very pleased with how things seem to be progressing. 10/17/2020 on evaluation today patient appears to be doing much better in regard to the right first, second, and fifth toes. He is making excellent progress and to be honest I am extremely pleased with where things stand today. Fortunately there is no signs of active infection at this time. No fevers, chills, nausea, vomiting, or diarrhea. 11/07/2020 upon evaluation today patient appears to actually be doing excellent in regard to his toe ulcers. The fifth and first toe on the right foot are what  of Service: [name] 07/20/2023 2:45 PM Medical Record Number: [id number] Patient Account Number: [id number] Date of Birth/Sex: Treating RN: 1950-12-28 (73 y.o. [name] Primary Care Provider: [name] Other Clinician: Referring Provider: Treating Provider/Extender: [name] in Treatment: 20 Subjective Chief Complaint Information obtained from Patient Bilateral T Ulcers 03/02/2023: right great toe ulcer with osteomyelitis oe History of Present Illness (HPI) 09/26/2020 on evaluation today patient appears to be doing somewhat poorly in regard to his bilateral feet at this point due to issues that he is been having that developed about 2 weeks ago. He was walking home which was about a mile and states that he first noted the areas on the right foot beginning. These were blisters that develop. The left started Thanksgiving day when he was up standing more cooking. He did see his primary care provider who referred him to podiatry. He saw Dr. Allena Katz and Dr. Allena Katz recommended Betadine moistened gauze dressings to the wounds and to follow-up in 2 weeks. With that being said the patient subsequently was concerned about infection 2 to 3 days later as was his family member who is with him today. I believe this was a sister. With that being said subsequently he ended up going to urgent care at that point he was given Diflucan and Keflex he is done with both at this time. He is could be canceling the appointment with Dr. Allena Katz they tell me at this point. His most recent hemoglobin A1c was 6.6 on November 12. His ABIs were 1.2 on the right and 1.03 on the left and appear to be doing excellent. With that being said he does not have diabetic shoes and I feel like the shoes were likely the culprit for what led to this issue  currently. There does not appear to be any signs of systemic infection nor local infection at this point which is great news. The patient does have a history of hypertension as well as potentially a recurrence of prostate cancer he is being followed by the cancer center for that at this point 10/03/2020 upon evaluation today patient appears to be doing better in regard to his toe ulcers. He has been tolerating the dressing changes without complication. Fortunately there is no signs of active infection at this time. The right great toe and fifth toe are the 2 worst out of everything here he has a couple that have healed on the left foot there is really nothing remaining open though he has several areas of callus on the left foot in fact 3 on the third fourth and fifth toes. That is going need to be removed today as well. 10/10/2020 upon evaluation today patient appears to be doing well in regard to his wounds. He still has wounds open on the first, second, and fifth toes of the right foot. Left foot is completely healed and the right fourth toe is also completely healed today. Overall very pleased with how things seem to be progressing. 10/17/2020 on evaluation today patient appears to be doing much better in regard to the right first, second, and fifth toes. He is making excellent progress and to be honest I am extremely pleased with where things stand today. Fortunately there is no signs of active infection at this time. No fevers, chills, nausea, vomiting, or diarrhea. 11/07/2020 upon evaluation today patient appears to actually be doing excellent in regard to his toe ulcers. The fifth and first toe on the right foot are what  07/20/2023 12:39:48 -------------------------------------------------------------------------------- Physician Orders Details Patient Name: Date of Service: [name] 07/20/2023 2:45 PM Medical Record Number: [id number] Patient Account Number: [id number] Date of Birth/Sex: Treating RN: Aug 31, 1951 (72 y.o. [name] Primary Care Provider:  [name] Other Clinician: Referring Provider: Treating Provider/Extender: [name] in Treatment: 20 The following information was scribed by: [name] The information was scribed for: [name] Verbal / Phone Orders: No Diagnosis Coding ICD-10 Coding Code Description L97.514 Non-pressure chronic ulcer of other part of right foot with necrosis of bone M86.171 Other acute osteomyelitis, right ankle and foot E11.621 Type 2 diabetes mellitus with foot ulcer I10 Essential (primary) hypertension N18.32 Chronic kidney disease, stage 3b Follow-up Appointments ppointment in 1 week. - Dr Lady Gary Rm 1 Return A Tuesday 10/1 @ 2:45 pm Anesthetic (In clinic) Topical Lidocaine 4% applied to wound bed Bathing/ Shower/ Hygiene May shower and wash wound with soap and water. Edema Control - Lymphedema / SCD / Other Bilateral Lower Extremities Avoid standing for long periods of time. Exercise regularly Moisturize legs daily. SRIHARI, HEDGES ([id number]) [id number] Page 5 of 11 Off-Loading Wound #8 Right T Great oe Other: - Wear the [employer] front offloading sandal when walking, minimal weight bearing right foot Wound Treatment Wound #8 - T Great oe Wound Laterality: Right Cleanser: Normal Saline (Generic) 1 x Per Day/30 Days Discharge Instructions: Cleanse the wound with Normal Saline prior to applying a clean dressing using gauze sponges, not tissue or cotton balls. Cleanser: Soap and Water 1 x Per Day/30 Days Discharge Instructions: May shower and wash wound with dial antibacterial soap and water prior to dressing change. Prim Dressing: Promogran Prisma Matrix, 4.34 (sq in) (silver collagen) 1 x Per Day/30 Days ary Discharge Instructions: Moisten collagen with saline or hydrogel Secondary Dressing: Optifoam Non-Adhesive Dressing, 4x4 in 1 x Per Day/30 Days Discharge Instructions: Apply over primary dressing cut to  make donut Secondary Dressing: Woven Gauze Sponges 2x2 in (Generic) 1 x Per Day/30 Days Discharge Instructions: Apply over primary dressing as directed. Secured With: Insurance underwriter, Sterile 2x75 (in/in) (Generic) 1 x Per Day/30 Days Discharge Instructions: Secure with stretch gauze as directed. Secured With: 65M Medipore H Soft Cloth Surgical T ape, 4 x 10 (in/yd) (Generic) 1 x Per Day/30 Days Discharge Instructions: Secure with tape as directed. Electronic Signature(s) Signed: 07/20/2023 3:41:55 PM By: [name] MD FACS Entered By: [name] on 07/20/2023 12:40:00 -------------------------------------------------------------------------------- Problem List Details Patient Name: Date of Service: [name], [name] 07/20/2023 2:45 PM Medical Record Number: [id number] Patient Account Number: [id number] Date of Birth/Sex: Treating RN: 05-24-1951 (72 y.o. [name] Primary Care Provider: [name] Other Clinician: Referring Provider: Treating Provider/Extender: [name]: 20 Active Problems ICD-10 Encounter Code Description Active Date MDM Diagnosis L97.514 Non-pressure chronic ulcer of other part of right foot with necrosis of bone 03/02/2023 No Yes M86.171 Other acute osteomyelitis, right ankle and foot 03/02/2023 No Yes E11.621 Type 2 diabetes mellitus with foot ulcer 03/02/2023 No Yes I10 Essential (primary) hypertension 03/02/2023 No Yes N18.32 Chronic kidney disease, stage 3b 03/02/2023 No Yes Acre, Teddie ([id number]) [id number] Page 6 of 11 Inactive Problems Resolved Problems ICD-10 Code Description Active Date Resolved Date L97.512 Non-pressure chronic ulcer of other part of right foot with fat layer exposed 03/09/2023 03/09/2023 Electronic Signature(s) Signed: 07/20/2023 3:36:02 PM By: [name] MD FACS Entered By: [name] on 07/20/2023  12:36:02 -------------------------------------------------------------------------------- Progress Note Details Patient Name: Date  a day Musculoskeletal Medical History: Positive for: Osteoarthritis Neurologic Medical History: Positive for: Neuropathy Oncologic Medical History: Positive for: Received Radiation Past Medical History Notes: Prostate cancer Immunizations Pneumococcal Vaccine: Received Pneumococcal Vaccination: No Implantable Devices None Family and Social History Cancer: No; Diabetes: Yes - Mother,Siblings; Heart Disease: No; Hereditary Spherocytosis: No; Hypertension: No; Kidney Disease: No; Lung Disease: No; Seizures: No; Stroke: No; Thyroid Problems: No; Tuberculosis: No; Never smoker; Marital Status - Single; Alcohol Use: Never;  Drug Use: No History; Caffeine Use: Rarely; Financial Concerns: No; Food, Clothing or Shelter Needs: No; Support System Lacking: No; Transportation Concerns: No Psychologist, prison and probation services) Signed: 07/20/2023 3:41:55 PM By: [name] MD FACS Signed: 07/20/2023 4:32:44 PM By: [name] RN, BSN Entered By: [name] on 07/20/2023 12:39:08 -------------------------------------------------------------------------------- SuperBill Details Patient Name: Date of Service: [name], [name] 07/20/2023 Medical Record Number: [id number] Patient Account Number: [id number] Date of Birth/Sex: Treating RN: [birth year] (72 y.o. [name] Primary Care Provider: [name] Other Clinician: Referring Provider: Treating Provider/Extender: [name]: [address], [name] ([id number]) [id number] Page 11 of 11 ICD-10 Codes Code Description L97.514 Non-pressure chronic ulcer of other part of right foot with necrosis of bone M86.171 Other acute osteomyelitis, right ankle and foot E11.621 Type 2 diabetes mellitus with foot ulcer I10 Essential (primary) hypertension N18.32 Chronic kidney disease, stage 3b Facility Procedures : CPT4 Code: 78295621 Description: 30865 - DEBRIDE WOUND 1ST 20 SQ CM OR < ICD-10 Diagnosis Description L97.514 Non-pressure chronic ulcer of other part of right foot with necrosis of bone Modifier: Quantity: 1 Physician Procedures : CPT4 Code Description Modifier [id number] 99213 - WC PHYS LEVEL 3 - EST PT 25 ICD-10 Diagnosis Description L97.514 Non-pressure chronic ulcer of other part of right foot with necrosis of bone M86.171 Other acute osteomyelitis, right ankle and foot E11.621  Type 2 diabetes mellitus with foot ulcer I10 Essential (primary) hypertension Quantity: 1 : [id number] 97597 - WC PHYS DEBR WO ANESTH 20 SQ CM ICD-10 Diagnosis Description L97.514 Non-pressure chronic  ulcer of other part of right foot with necrosis of bone Quantity: 1 Electronic Signature(s) Signed: 07/20/2023 3:41:31 PM By: [name] MD FACS Entered By: [name] on 07/20/2023 12:41:31  a day Musculoskeletal Medical History: Positive for: Osteoarthritis Neurologic Medical History: Positive for: Neuropathy Oncologic Medical History: Positive for: Received Radiation Past Medical History Notes: Prostate cancer Immunizations Pneumococcal Vaccine: Received Pneumococcal Vaccination: No Implantable Devices None Family and Social History Cancer: No; Diabetes: Yes - Mother,Siblings; Heart Disease: No; Hereditary Spherocytosis: No; Hypertension: No; Kidney Disease: No; Lung Disease: No; Seizures: No; Stroke: No; Thyroid Problems: No; Tuberculosis: No; Never smoker; Marital Status - Single; Alcohol Use: Never;  Drug Use: No History; Caffeine Use: Rarely; Financial Concerns: No; Food, Clothing or Shelter Needs: No; Support System Lacking: No; Transportation Concerns: No Psychologist, prison and probation services) Signed: 07/20/2023 3:41:55 PM By: [name] MD FACS Signed: 07/20/2023 4:32:44 PM By: [name] RN, BSN Entered By: [name] on 07/20/2023 12:39:08 -------------------------------------------------------------------------------- SuperBill Details Patient Name: Date of Service: [name], [name] 07/20/2023 Medical Record Number: [id number] Patient Account Number: [id number] Date of Birth/Sex: Treating RN: [birth year] (72 y.o. [name] Primary Care Provider: [name] Other Clinician: Referring Provider: Treating Provider/Extender: [name]: [address], [name] ([id number]) [id number] Page 11 of 11 ICD-10 Codes Code Description L97.514 Non-pressure chronic ulcer of other part of right foot with necrosis of bone M86.171 Other acute osteomyelitis, right ankle and foot E11.621 Type 2 diabetes mellitus with foot ulcer I10 Essential (primary) hypertension N18.32 Chronic kidney disease, stage 3b Facility Procedures : CPT4 Code: 78295621 Description: 30865 - DEBRIDE WOUND 1ST 20 SQ CM OR < ICD-10 Diagnosis Description L97.514 Non-pressure chronic ulcer of other part of right foot with necrosis of bone Modifier: Quantity: 1 Physician Procedures : CPT4 Code Description Modifier [id number] 99213 - WC PHYS LEVEL 3 - EST PT 25 ICD-10 Diagnosis Description L97.514 Non-pressure chronic ulcer of other part of right foot with necrosis of bone M86.171 Other acute osteomyelitis, right ankle and foot E11.621  Type 2 diabetes mellitus with foot ulcer I10 Essential (primary) hypertension Quantity: 1 : [id number] 97597 - WC PHYS DEBR WO ANESTH 20 SQ CM ICD-10 Diagnosis Description L97.514 Non-pressure chronic  ulcer of other part of right foot with necrosis of bone Quantity: 1 Electronic Signature(s) Signed: 07/20/2023 3:41:31 PM By: [name] MD FACS Entered By: [name] on 07/20/2023 12:41:31  07/20/2023 12:39:48 -------------------------------------------------------------------------------- Physician Orders Details Patient Name: Date of Service: [name] 07/20/2023 2:45 PM Medical Record Number: [id number] Patient Account Number: [id number] Date of Birth/Sex: Treating RN: Aug 31, 1951 (72 y.o. [name] Primary Care Provider:  [name] Other Clinician: Referring Provider: Treating Provider/Extender: [name] in Treatment: 20 The following information was scribed by: [name] The information was scribed for: [name] Verbal / Phone Orders: No Diagnosis Coding ICD-10 Coding Code Description L97.514 Non-pressure chronic ulcer of other part of right foot with necrosis of bone M86.171 Other acute osteomyelitis, right ankle and foot E11.621 Type 2 diabetes mellitus with foot ulcer I10 Essential (primary) hypertension N18.32 Chronic kidney disease, stage 3b Follow-up Appointments ppointment in 1 week. - Dr Lady Gary Rm 1 Return A Tuesday 10/1 @ 2:45 pm Anesthetic (In clinic) Topical Lidocaine 4% applied to wound bed Bathing/ Shower/ Hygiene May shower and wash wound with soap and water. Edema Control - Lymphedema / SCD / Other Bilateral Lower Extremities Avoid standing for long periods of time. Exercise regularly Moisturize legs daily. SRIHARI, HEDGES ([id number]) [id number] Page 5 of 11 Off-Loading Wound #8 Right T Great oe Other: - Wear the Darco front offloading sandal when walking, minimal weight bearing right foot Wound Treatment Wound #8 - T Great oe Wound Laterality: Right Cleanser: Normal Saline (Generic) 1 x Per Day/30 Days Discharge Instructions: Cleanse the wound with Normal Saline prior to applying a clean dressing using gauze sponges, not tissue or cotton balls. Cleanser: Soap and Water 1 x Per Day/30 Days Discharge Instructions: May shower and wash wound with dial antibacterial soap and water prior to dressing change. Prim Dressing: Promogran Prisma Matrix, 4.34 (sq in) (silver collagen) 1 x Per Day/30 Days ary Discharge Instructions: Moisten collagen with saline or hydrogel Secondary Dressing: Optifoam Non-Adhesive Dressing, 4x4 in 1 x Per Day/30 Days Discharge Instructions: Apply over primary dressing cut to  make donut Secondary Dressing: Woven Gauze Sponges 2x2 in (Generic) 1 x Per Day/30 Days Discharge Instructions: Apply over primary dressing as directed. Secured With: Insurance underwriter, Sterile 2x75 (in/in) (Generic) 1 x Per Day/30 Days Discharge Instructions: Secure with stretch gauze as directed. Secured With: 65M Medipore H Soft Cloth Surgical T ape, 4 x 10 (in/yd) (Generic) 1 x Per Day/30 Days Discharge Instructions: Secure with tape as directed. Electronic Signature(s) Signed: 07/20/2023 3:41:55 PM By: [name] MD FACS Entered By: [name] on 07/20/2023 12:40:00 -------------------------------------------------------------------------------- Problem List Details Patient Name: Date of Service: [name], [name] 07/20/2023 2:45 PM Medical Record Number: [id number] Patient Account Number: [id number] Date of Birth/Sex: Treating RN: 05-24-1951 (72 y.o. [name] Primary Care Provider: [name] Other Clinician: Referring Provider: Treating Provider/Extender: [name]: 20 Active Problems ICD-10 Encounter Code Description Active Date MDM Diagnosis L97.514 Non-pressure chronic ulcer of other part of right foot with necrosis of bone 03/02/2023 No Yes M86.171 Other acute osteomyelitis, right ankle and foot 03/02/2023 No Yes E11.621 Type 2 diabetes mellitus with foot ulcer 03/02/2023 No Yes I10 Essential (primary) hypertension 03/02/2023 No Yes N18.32 Chronic kidney disease, stage 3b 03/02/2023 No Yes Acre, Teddie ([id number]) [id number] Page 6 of 11 Inactive Problems Resolved Problems ICD-10 Code Description Active Date Resolved Date L97.512 Non-pressure chronic ulcer of other part of right foot with fat layer exposed 03/09/2023 03/09/2023 Electronic Signature(s) Signed: 07/20/2023 3:36:02 PM By: [name] MD FACS Entered By: [name] on 07/20/2023  12:36:02 -------------------------------------------------------------------------------- Progress Note Details Patient Name: Date  a day Musculoskeletal Medical History: Positive for: Osteoarthritis Neurologic Medical History: Positive for: Neuropathy Oncologic Medical History: Positive for: Received Radiation Past Medical History Notes: Prostate cancer Immunizations Pneumococcal Vaccine: Received Pneumococcal Vaccination: No Implantable Devices None Family and Social History Cancer: No; Diabetes: Yes - Mother,Siblings; Heart Disease: No; Hereditary Spherocytosis: No; Hypertension: No; Kidney Disease: No; Lung Disease: No; Seizures: No; Stroke: No; Thyroid Problems: No; Tuberculosis: No; Never smoker; Marital Status - Single; Alcohol Use: Never;  Drug Use: No History; Caffeine Use: Rarely; Financial Concerns: No; Food, Clothing or Shelter Needs: No; Support System Lacking: No; Transportation Concerns: No Psychologist, prison and probation services) Signed: 07/20/2023 3:41:55 PM By: [name] MD FACS Signed: 07/20/2023 4:32:44 PM By: [name] RN, BSN Entered By: [name] on 07/20/2023 12:39:08 -------------------------------------------------------------------------------- SuperBill Details Patient Name: Date of Service: [name], [name] 07/20/2023 Medical Record Number: [id number] Patient Account Number: [id number] Date of Birth/Sex: Treating RN: [birth year] (72 y.o. [name] Primary Care Provider: [name] Other Clinician: Referring Provider: Treating Provider/Extender: [name]: [address], [name] ([id number]) [id number] Page 11 of 11 ICD-10 Codes Code Description L97.514 Non-pressure chronic ulcer of other part of right foot with necrosis of bone M86.171 Other acute osteomyelitis, right ankle and foot E11.621 Type 2 diabetes mellitus with foot ulcer I10 Essential (primary) hypertension N18.32 Chronic kidney disease, stage 3b Facility Procedures : CPT4 Code: [id number] Description: 30865 - DEBRIDE WOUND 1ST 20 SQ CM OR < ICD-10 Diagnosis Description L97.514 Non-pressure chronic ulcer of other part of right foot with necrosis of bone Modifier: Quantity: 1 Physician Procedures : CPT4 Code Description Modifier [id number] - WC PHYS LEVEL 3 - EST PT 25 ICD-10 Diagnosis Description L97.514 Non-pressure chronic ulcer of other part of right foot with necrosis of bone M86.171 Other acute osteomyelitis, right ankle and foot E11.621  Type 2 diabetes mellitus with foot ulcer I10 Essential (primary) hypertension Quantity: 1 : [id number] - WC PHYS DEBR WO ANESTH 20 SQ CM ICD-10 Diagnosis Description L97.514 Non-pressure chronic  ulcer of other part of right foot with necrosis of bone Quantity: 1 Electronic Signature(s) Signed: 07/20/2023 3:41:31 PM By: [name] MD FACS Entered By: [name] on 07/20/2023 12:41:31  of Service: [name] 07/20/2023 2:45 PM Medical Record Number: [id number] Patient Account Number: [id number] Date of Birth/Sex: Treating RN: 1950-12-28 (73 y.o. [name] Primary Care Provider: [name] Other Clinician: Referring Provider: Treating Provider/Extender: [name] in Treatment: 20 Subjective Chief Complaint Information obtained from Patient Bilateral T Ulcers 03/02/2023: right great toe ulcer with osteomyelitis oe History of Present Illness (HPI) 09/26/2020 on evaluation today patient appears to be doing somewhat poorly in regard to his bilateral feet at this point due to issues that he is been having that developed about 2 weeks ago. He was walking home which was about a mile and states that he first noted the areas on the right foot beginning. These were blisters that develop. The left started Thanksgiving day when he was up standing more cooking. He did see his primary care provider who referred him to podiatry. He saw Dr. Allena Katz and Dr. Allena Katz recommended Betadine moistened gauze dressings to the wounds and to follow-up in 2 weeks. With that being said the patient subsequently was concerned about infection 2 to 3 days later as was his family member who is with him today. I believe this was a sister. With that being said subsequently he ended up going to urgent care at that point he was given Diflucan and Keflex he is done with both at this time. He is could be canceling the appointment with Dr. Allena Katz they tell me at this point. His most recent hemoglobin A1c was 6.6 on November 12. His ABIs were 1.2 on the right and 1.03 on the left and appear to be doing excellent. With that being said he does not have diabetic shoes and I feel like the shoes were likely the culprit for what led to this issue  currently. There does not appear to be any signs of systemic infection nor local infection at this point which is great news. The patient does have a history of hypertension as well as potentially a recurrence of prostate cancer he is being followed by the cancer center for that at this point 10/03/2020 upon evaluation today patient appears to be doing better in regard to his toe ulcers. He has been tolerating the dressing changes without complication. Fortunately there is no signs of active infection at this time. The right great toe and fifth toe are the 2 worst out of everything here he has a couple that have healed on the left foot there is really nothing remaining open though he has several areas of callus on the left foot in fact 3 on the third fourth and fifth toes. That is going need to be removed today as well. 10/10/2020 upon evaluation today patient appears to be doing well in regard to his wounds. He still has wounds open on the first, second, and fifth toes of the right foot. Left foot is completely healed and the right fourth toe is also completely healed today. Overall very pleased with how things seem to be progressing. 10/17/2020 on evaluation today patient appears to be doing much better in regard to the right first, second, and fifth toes. He is making excellent progress and to be honest I am extremely pleased with where things stand today. Fortunately there is no signs of active infection at this time. No fevers, chills, nausea, vomiting, or diarrhea. 11/07/2020 upon evaluation today patient appears to actually be doing excellent in regard to his toe ulcers. The fifth and first toe on the right foot are what  tissue. Wound measurements: 0.6x1.2x0.1 05/14/2023: The wound is smaller again today. There is callus accumulation around the edges with a little bit of slough on the surface. Measurements L x W x D (cm) 0.5x1.2x0.2 Area (cm): 0.471 Volume (cm) : 0.094 % Reduction in Area: 95.90% % Reduction in Volume: 95.90% 05/21/2023: He says that he was on his feet  most of Saturday and his foot got wet. There is tissue breakdown secondary to moisture on the plantar aspect of his toe. There is gray-green slough and callus accumulation. 05/28/2023: The wound looks better today. There is still some moisture related tissue breakdown that has progressed on the plantar aspect of his toe, but I think this is residual from last week, as there is no active tissue maceration. Measurements L x W x D (cm) 0.8x1x0. Area (cm): 0.628 Volume (cm) : 0.188 % Reduction in Area: 94.60% % Reduction in Volume: 91.90% 06/05/2023: The wound has improved considerably over the past week. There has been substantial epithelialization. There is some callus accumulation around the edges and light slough on the surface. 06/12/2023: The wound continues to improve. It is down to less than half a centimeter in greatest dimension and is completely flush with the surrounding tissue. There is some dry skin and callus accumulation around the edges with light slough on the surface. Measurements L x W x D (cm) 0.3x0.5x0.1 Area (cm): 0.118 Volume (cm) : 0.012 % Reduction in Area: 99.00% % Reduction in Volume: 99.50% 06/19/2023: The wound measured the same this week, but visually it appears smaller. There is some surrounding callus and dry skin but the surface looks robust and healthy. 06/26/2023: Unfortunately, there has been massive breakdown of his wound. Although the dressing did not have any drainage on it, there was extensive fluid and blood that collected underneath the periwound callus that has resulted in substantial deterioration. The bone remains covered with soft tissue, but the wound is markedly larger. There appears to be some pressure-induced tissue injury on the weightbearing aspect of the toe. 07/06/2023: The culture that I took at his last visit was positive for methicillin-resistant Staph aureus with tetracycline resistance. I prescribed a course of Bactrim and he is currently taking  this. His wound is markedly improved. No evidence of pressure-induced tissue injury. There is some callus accumulation around the wound edges and some slough on the surface. 07/13/2023: The wound continues to improve. There is a little bit of callus accumulation around the edges and the skin is starting to roll inward, but the surface is clean and the dimensions have decreased. 07/20/2023: The wound measured smaller again today. It is fairly superficial at this point. It is a little bit on the dry side. Electronic Signature(s) Signed: 07/20/2023 3:38:59 PM By: [name] MD FACS Entered By: [name] on 07/20/2023 12:38:59 Ditmars, [name] ([id number]) [id number] Page 4 of 11 -------------------------------------------------------------------------------- Physical Exam Details Patient Name: Date of Service: [name] 07/20/2023 2:45 PM Medical Record Number: [id number] Patient Account Number: [id number] Date of Birth/Sex: Treating RN: [birth year] (72 y.o. [name] Primary Care Provider: [name] Other Clinician: Referring Provider: Treating Provider/Extender: [name]: 20 Constitutional Hypertensive, asymptomatic. . . . no acute distress. Respiratory Normal work of breathing on room air. Notes 07/20/2023: The wound measured smaller again today. It is fairly superficial at this point. It is a little bit on the dry side. Electronic Signature(s) Signed: 07/20/2023 3:39:48 PM By: [name] MD FACS Entered By: [name] on

## 2023-07-21 ENCOUNTER — Other Ambulatory Visit (HOSPITAL_BASED_OUTPATIENT_CLINIC_OR_DEPARTMENT_OTHER): Payer: Self-pay | Admitting: Cardiovascular Disease

## 2023-07-22 ENCOUNTER — Ambulatory Visit: Payer: Medicare HMO

## 2023-07-22 VITALS — BP 161/91

## 2023-07-22 DIAGNOSIS — I10 Essential (primary) hypertension: Secondary | ICD-10-CM

## 2023-07-22 DIAGNOSIS — E1142 Type 2 diabetes mellitus with diabetic polyneuropathy: Secondary | ICD-10-CM | POA: Diagnosis not present

## 2023-07-22 NOTE — Progress Notes (Signed)
07/22/2023 Name: [name] MRN: [id number] DOB: 29-Mar-1951  Chief Complaint  Patient presents with   Diabetes   Hypertension    [name] is a 72 y.o. year old male who was referred for medication management by their primary care provider, [name] Saint, MD. They presented for a face to face visit today.   They were referred to the pharmacist by their PCP for assistance in managing diabetes, hypertension, and complex medication management    Subjective:  Care Team: Primary Care Provider: Deeann Saint, MD ; Next Scheduled Visit: 09/02/23  Medication Access/Adherence  Current Pharmacy:  [facility] [phone] [place], [place] - [name] [address] Phone: [phone] Fax: [phone]  [facility], [place] (New Address) - [place], [place] - [address] [facility] AT Previously: [name], [name] [address] [place] [address] Phone: [phone] Fax: [phone]  [facility] - [place], [place] - [address] [place] [address] Phone: [phone] Fax: [phone]   Patient reports affordability concerns with their medications: No  Patient reports access/transportation concerns to their pharmacy: No  Patient reports adherence concerns with their medications:  No     Diabetes:  Current medications:  Medications tried in the past: [place]  Current glucose readings:  "Tries to check once daily" Does not bring meter to appt and cannot recall readings Tried to start on CGM sensors in the past but could not afford copay at [facility]    Patient reports hypoglycemic s/sx including dizziness. Not checking BG at this times  Patient reports hyperglycemic symptoms including blurred vision.   Current medication access support: None needed  Hypertension:  Current medications:   Current hypertension medications:       Sig   amLODipine (NORVASC) 10 MG tablet TAKE 1 TABLET BY MOUTH EVERY DAY   carvedilol (COREG) 25 MG tablet TAKE 1 TABLET BY MOUTH TWICE DAILY   doxazosin (CARDURA) 8 MG tablet Take 1 tablet (8 mg total) by mouth at bedtime.   hydrALAZINE (APRESOLINE) 50 MG tablet Take 1 tablet (50 mg total) by mouth in the morning and at bedtime.       Medications previously tried: Cardizem  Patient has a validated, automated, upper arm home BP cuff Current blood pressure readings readings: not keeping a log, cannot recall readings  Patient reports hypotensive s/sx including dizziness, lightheadedness.  Patient denies hypertensive symptoms including headache, chest pain, shortness of breath   Medication Management:  Current adherence strategy: pill packaging and delivery from [facility] has ended but patient finishing up the last of his packs. Will now be getting from [facility]  Patient reports Good adherence to medications  Patient reports the following barriers to adherence: med changes (none recently)  Recent fill dates: 07/21/23 for his medications from [facility] - have not shipped yet   Objective:  Lab Results  Component Value Date   HGBA1C 7.9 (H) 11/26/2022    Lab Results  Component Value Date   CREATININE 2.14 (H) 02/08/2023   BUN 34 (H) 02/08/2023   NA 142 02/08/2023   K 3.9 02/08/2023   CL 107 02/08/2023   CO2 25 02/08/2023    Lab Results  Component Value Date   CHOL 214 (H) 01/23/2023   HDL 52 01/23/2023   LDLCALC 143 (H) 01/23/2023   LDLDIRECT 98 09/04/2022   TRIG 97 01/23/2023  CHOLHDL 4.1 01/23/2023    Medications Reviewed Today   Medications were not reviewed in this encounter       Assessment/Plan:   Diabetes: - Currently uncontrolled - Reviewed long term cardiovascular and renal outcomes of uncontrolled blood sugar - Reviewed goal A1c, goal fasting, and goal 2 hour post prandial glucose - Recommend to  continue current insulin regimen  - Recommend to check glucose at least once daily with finger prick and if having symptoms of low/high BG. Reviewed.  -Pursuing CGM Dexcom through [employer] to see if this is more affordable.   Hypertension: - Currently uncontrolled - Reviewed long term cardiovascular and renal outcomes of uncontrolled blood pressure -BP elevated in office today at 161/91 but patient has not taken his morning medications yet, does not have them with him to take. - Reviewed appropriate blood pressure monitoring technique and reviewed goal blood pressure. Recommended to check home blood pressure and heart rate 2x/day and if symptomatic of low/high BP. Keep a log   Follow Up Plan: 4 weeks   Sherrill Raring, PharmD Clinical Pharmacist [phone]

## 2023-07-22 NOTE — Patient Instructions (Addendum)
Continue all of your medications, as instructed.  Check blood pressure and blood sugars at least once daily.  Check blood pressure and blood sugars if you experience any dizziness, vision issues, light-headed, etc.  Please contact me directly if any medication issues or concerns.  Next appt: 08/26/23 in office with pharmacist  Sherrill Raring, PharmD Clinical Pharmacist [phone]

## 2023-07-24 ENCOUNTER — Other Ambulatory Visit (HOSPITAL_BASED_OUTPATIENT_CLINIC_OR_DEPARTMENT_OTHER): Payer: Self-pay | Admitting: Cardiovascular Disease

## 2023-07-28 ENCOUNTER — Ambulatory Visit (HOSPITAL_BASED_OUTPATIENT_CLINIC_OR_DEPARTMENT_OTHER): Payer: Medicare HMO | Admitting: General Surgery

## 2023-07-29 ENCOUNTER — Encounter (HOSPITAL_BASED_OUTPATIENT_CLINIC_OR_DEPARTMENT_OTHER): Payer: Medicare HMO | Attending: General Surgery | Admitting: General Surgery

## 2023-07-29 DIAGNOSIS — E1122 Type 2 diabetes mellitus with diabetic chronic kidney disease: Secondary | ICD-10-CM | POA: Insufficient documentation

## 2023-07-29 DIAGNOSIS — M86171 Other acute osteomyelitis, right ankle and foot: Secondary | ICD-10-CM | POA: Diagnosis not present

## 2023-07-29 DIAGNOSIS — L97514 Non-pressure chronic ulcer of other part of right foot with necrosis of bone: Secondary | ICD-10-CM | POA: Insufficient documentation

## 2023-07-29 DIAGNOSIS — L97512 Non-pressure chronic ulcer of other part of right foot with fat layer exposed: Secondary | ICD-10-CM | POA: Diagnosis not present

## 2023-07-29 DIAGNOSIS — E11621 Type 2 diabetes mellitus with foot ulcer: Secondary | ICD-10-CM | POA: Diagnosis not present

## 2023-07-29 DIAGNOSIS — E1169 Type 2 diabetes mellitus with other specified complication: Secondary | ICD-10-CM | POA: Insufficient documentation

## 2023-07-29 DIAGNOSIS — I129 Hypertensive chronic kidney disease with stage 1 through stage 4 chronic kidney disease, or unspecified chronic kidney disease: Secondary | ICD-10-CM | POA: Diagnosis not present

## 2023-07-29 DIAGNOSIS — N1832 Chronic kidney disease, stage 3b: Secondary | ICD-10-CM | POA: Diagnosis not present

## 2023-07-29 NOTE — Progress Notes (Signed)
Electronic Signature(s) Signed: 07/29/2023 5:18:43 PM By: [name] RN, BSN Previous Signature: 07/29/2023 8:36:53 AM Version By: Dayton Scrape Entered By: [name] on 07/29/2023 05:50:57 -------------------------------------------------------------------------------- Vitals Details Patient Name: Date of Service: [name], [name] 07/29/2023 8:15 [name] M Medical Record Number: [id number] Patient Account Number: [id number] Date of Birth/Sex: Treating RN: 1951/01/27 (72 y.o. M) Primary Care Melvin Whiteford: [name] Other Clinician: Referring Marykatherine Sherwood: Treating Duncan Alejandro/Extender: [name] in Treatment: 21 Vital Signs Time Taken: 08:31 Temperature (F): 97.8 Height (in): 67 Pulse (bpm): 76 Weight (lbs): 154 Respiratory Rate (breaths/min): 18 Body Mass Index (BMI): 24.1 Blood Pressure (mmHg): 191/94 Capillary Blood Glucose (mg/dl): 782 Reference Range: 80 - 120 mg / dl Notes glucose per pt report yesterday Electronic Signature(s) Signed: 07/29/2023 5:18:43 PM By: [name] RN, BSN Fancher, Signed: 07/29/2023 5:18:43 PM By: [name] RN, BSN Jaciel ([id number]) [phone].pdf Page 8 of 8 Previous Signature: 07/29/2023 8:36:53 AM Version By: Dayton Scrape Entered By: [name] on 07/29/2023 05:47:43  [name], [name] ([id number]) [id number] Page 1 of 8 Visit Report for 07/29/2023 Arrival Information Details Patient Name: Date of Service: [name] 07/29/2023 8:15 [name] M Medical Record Number: [id number] Patient Account Number: [id number] Date of Birth/Sex: Treating RN: August 14, 1951 (72 y.o. M) Primary Care Fabio Wah: [name] Other Clinician: Referring Latrenda Irani: Treating Kamir Selover/Extender: [name] in Treatment: 21 Visit Information History Since Last Visit Added or deleted any medications: No Patient Arrived: Ambulatory Any new allergies or adverse reactions: No Arrival Time: 08:31 Had [name] fall or experienced change in No Accompanied By: self activities of daily living that may affect Transfer Assistance: None risk of falls: Patient Identification Verified: Yes Signs or symptoms of abuse/neglect since last No Secondary Verification Process Completed: Yes visito Patient Requires Transmission-Based Precautions: No Hospitalized since last visit: No Patient Has Alerts: Yes Implantable device outside of the clinic No Patient Alerts: Patient on Blood Thinner excluding cellular tissue based products placed in the center since last visit: Has Dressing in Place as Prescribed: Yes Has Footwear/Offloading in Place as Yes Prescribed: Right: Removable Cast Walker/Walking Boot Pain Present Now: No Electronic Signature(s) Signed: 07/29/2023 5:18:43 PM By: [name] RN, BSN Previous Signature: 07/29/2023 8:36:53 AM Version By: Dayton Scrape Entered By: [name] on 07/29/2023 05:47:02 -------------------------------------------------------------------------------- Encounter Discharge Information Details Patient Name: Date of Service: [name], [name] 07/29/2023 8:15 [name] M Medical Record Number: [id number] Patient Account Number: [id number] Date of Birth/Sex: Treating RN: [birth year] (72 y.o. [name] Primary Care Dary Dilauro: [name] Other Clinician: Referring Naksh Radi: Treating Elster Corbello/Extender: [name] in Treatment: 21 Encounter Discharge Information Items Post Procedure Vitals Discharge Condition: Stable Temperature (F): 97.8 Ambulatory Status: Ambulatory Pulse (bpm): 76 Discharge Destination: Home Respiratory Rate (breaths/min): 18 Transportation: Private Auto Blood Pressure (mmHg): 191/94 Accompanied By: self Schedule Follow-up Appointment: Yes Clinical Summary of Care: Patient Declined Electronic Signature(s) Signed: 07/29/2023 5:18:43 PM By: [name] RN, BSN Entered By: [name] on 07/29/2023 06:10:31 [name] ([id number]) [id number] Page 2 of 8 -------------------------------------------------------------------------------- Lower Extremity Assessment Details Patient Name: Date of Service: [name] 07/29/2023 8:15 [name] M Medical Record Number: [id number] Patient Account Number: [id number] Date of Birth/Sex: Treating RN: [birth year] (72 y.o. [name] Primary Care Gerri Acre: [name] Other Clinician: Referring Aurthur Wingerter: Treating Jamont Mellin/Extender: [name]: 21 Edema Assessment Assessed: [Left: No] Franne Forts: No] Edema: [Left: N] [Right: o] Calf Left: Right: Point of Measurement: 33 cm From Medial Instep 34.5 cm Ankle Left: Right: Point of Measurement: 10 cm From Medial Instep 21.7 cm Vascular Assessment Pulses: Dorsalis Pedis Palpable: [Right:Yes] Extremity colors, hair growth, and conditions: Extremity Color: [Right:Normal] Hair Growth on Extremity: [Right:Yes] Temperature of Extremity: [Right:Warm] Capillary Refill: [Right:< 3 seconds] Dependent Rubor: [Right:No No] Electronic Signature(s) Signed: 07/29/2023 5:18:43 PM By: [name] RN, BSN Entered By: [name] on 07/29/2023  05:48:11 -------------------------------------------------------------------------------- Multi Wound Chart Details Patient Name: Date of Service: [name], [name] 07/29/2023 8:15 [name] M Medical Record Number: [id number] Patient Account Number: [id number] Date of Birth/Sex: Treating RN: [birth year] (72 y.o. M) Primary Care Yared Barefoot: [name] Other Clinician: Referring Kaylah Chiasson: Treating Dejah Droessler/Extender: [name]: 21 Vital Signs Height(in): 67 Capillary Blood Glucose(mg/dl): 938 Weight(lbs): 182 Pulse(bpm): 76 Body Mass Index(BMI): 24.1 Blood Pressure(mmHg): 191/94 Temperature(F): 97.8 Respiratory Rate(breaths/min): 18 Mccown, Fin ([id number]) [8:Photos:] [N/[name]:N/[name]] Right T Great oe N/[name] N/[name] Wound Location: Gradually Appeared N/[name] N/[name] Wounding Event: Diabetic Wound/Ulcer of the Lower N/[name] N/[name]  Birth/Sex: Treating RN: [birth year] (72 y.o. Rema Fendt, [name] ([id number]) [id number] Page 5 of 8 Primary Care Mora Pedraza: [name] Other Clinician: Referring Lan Entsminger: Treating Jeanna Giuffre/Extender: [name] in Treatment: 21 Active Problems Location of Pain Severity and Description of Pain Patient Has Paino No Site Locations Rate the pain. Current  Pain Level: 0 Pain Management and Medication Current Pain Management: Electronic Signature(s) Signed: 07/29/2023 5:18:43 PM By: [name] RN, BSN Previous Signature: 07/29/2023 8:36:53 AM Version By: Dayton Scrape Entered By: [name] on 07/29/2023 05:47:51 -------------------------------------------------------------------------------- Patient/Caregiver Education Details Patient Name: Date of Service: [name], [name] 10/2/2024andnbsp8:15 [name] M Medical Record Number: [id number] Patient Account Number: [id number] Date of Birth/Gender: Treating RN: 1951-05-30 (72 y.o. [name] Primary Care Physician: [name] Other Clinician: Referring Physician: Treating Physician/Extender: [name] in Treatment: 21 Education Assessment Education Provided To: Patient Education Topics Provided Elevated Blood Sugar/ Impact on Healing: Methods: Explain/Verbal Responses: Reinforcements needed, State content correctly Offloading: Methods: Explain/Verbal Responses: Reinforcements needed, State content correctly Wound/Skin Impairment: Methods: Explain/Verbal Responses: Reinforcements needed, State content correctly [name], [name] ([id number]) [id number] Page 6 of 8 Electronic Signature(s) Signed: 07/29/2023 5:18:43 PM By: [name] RN, BSN Entered By: [name] on 07/29/2023 05:52:22 -------------------------------------------------------------------------------- Wound Assessment Details Patient Name: Date of Service: [name], [name] 07/29/2023 8:15 [name] M Medical Record Number: [id number] Patient Account Number: [id number] Date of Birth/Sex: Treating RN: [birth year] (72 y.o. M) Primary Care Wilfrid Hyser: [name] Other Clinician: Referring Daren Doswell: Treating Keashia Haskins/Extender: [name]: 21 Wound Status Wound Number: 8 Primary Diabetic Wound/Ulcer of the Lower  Extremity Etiology: Wound Location: Right T Great oe Wound Open Wounding Event: Gradually Appeared Status: Date Acquired: 02/04/2023 Comorbid Hypertension, Type II Diabetes, Osteoarthritis, Neuropathy, Weeks Of Treatment: 21 History: Received Radiation Clustered Wound: No Photos Wound Measurements Length: (cm) 0.4 Width: (cm) 0.5 Depth: (cm) 0.1 Area: (cm) 0.157 Volume: (cm) 0.016 % Reduction in Area: 98.6% % Reduction in Volume: 99.3% Epithelialization: Medium (34-66%) Tunneling: No Undermining: No Wound Description Classification: Grade 3 Wound Margin: Distinct, outline attached Exudate Amount: Medium Exudate Type: Serosanguineous Exudate Color: red, brown Foul Odor After Cleansing: No Slough/Fibrino No Wound Bed Granulation Amount: Large (67-100%) Exposed Structure Granulation Quality: Red Fascia Exposed: No Necrotic Amount: None Present (0%) Fat Layer (Subcutaneous Tissue) Exposed: Yes Tendon Exposed: No Muscle Exposed: No Joint Exposed: No Bone Exposed: No Periwound Skin Texture Texture Color No Abnormalities Noted: No No Abnormalities Noted: Yes Callus: Yes Temperature / Pain Temperature: No Abnormality Moisture No Abnormalities Noted: No Dry / ScalyJaedan [name], [name] ([id number]) [id number] Page 7 of 8 Maceration: No Treatment Notes Wound #8 (Toe Great) Wound Laterality: Right Cleanser Normal Saline Discharge Instruction: Cleanse the wound with Normal Saline prior to applying [name] clean dressing using gauze sponges, not tissue or cotton balls. Soap and Water Discharge Instruction: May shower and wash wound with dial antibacterial soap and water prior to dressing change. Peri-Wound Care Topical Primary Dressing Promogran Prisma Matrix, 4.34 (sq in) (silver collagen) Discharge Instruction: Moisten collagen with saline or hydrogel Secondary Dressing Optifoam Non-Adhesive Dressing, 4x4 in Discharge Instruction: Apply over  primary dressing cut to make donut Woven Gauze Sponges 2x2 in Discharge Instruction: Apply over primary dressing as directed. Secured With Conforming Stretch Gauze Bandage, Sterile 2x75 (in/in) Discharge Instruction: Secure with stretch gauze as directed. 59M Medipore H Soft Cloth Surgical T ape, 4 x 10 (in/yd) Discharge Instruction: Secure with tape as directed. Compression Wrap Compression Stockings Add-Ons  Birth/Sex: Treating RN: [birth year] (72 y.o. Rema Fendt, [name] ([id number]) [id number] Page 5 of 8 Primary Care Mora Pedraza: [name] Other Clinician: Referring Lan Entsminger: Treating Jeanna Giuffre/Extender: [name] in Treatment: 21 Active Problems Location of Pain Severity and Description of Pain Patient Has Paino No Site Locations Rate the pain. Current  Pain Level: 0 Pain Management and Medication Current Pain Management: Electronic Signature(s) Signed: 07/29/2023 5:18:43 PM By: [name] RN, BSN Previous Signature: 07/29/2023 8:36:53 AM Version By: Dayton Scrape Entered By: [name] on 07/29/2023 05:47:51 -------------------------------------------------------------------------------- Patient/Caregiver Education Details Patient Name: Date of Service: [name], [name] 10/2/2024andnbsp8:15 [name] M Medical Record Number: [id number] Patient Account Number: [id number] Date of Birth/Gender: Treating RN: 1951-05-30 (72 y.o. [name] Primary Care Physician: [name] Other Clinician: Referring Physician: Treating Physician/Extender: [name] in Treatment: 21 Education Assessment Education Provided To: Patient Education Topics Provided Elevated Blood Sugar/ Impact on Healing: Methods: Explain/Verbal Responses: Reinforcements needed, State content correctly Offloading: Methods: Explain/Verbal Responses: Reinforcements needed, State content correctly Wound/Skin Impairment: Methods: Explain/Verbal Responses: Reinforcements needed, State content correctly [name], [name] ([id number]) [id number] Page 6 of 8 Electronic Signature(s) Signed: 07/29/2023 5:18:43 PM By: [name] RN, BSN Entered By: [name] on 07/29/2023 05:52:22 -------------------------------------------------------------------------------- Wound Assessment Details Patient Name: Date of Service: [name], [name] 07/29/2023 8:15 [name] M Medical Record Number: [id number] Patient Account Number: [id number] Date of Birth/Sex: Treating RN: [birth year] (72 y.o. M) Primary Care Wilfrid Hyser: [name] Other Clinician: Referring Daren Doswell: Treating Keashia Haskins/Extender: [name]: 21 Wound Status Wound Number: 8 Primary Diabetic Wound/Ulcer of the Lower  Extremity Etiology: Wound Location: Right T Great oe Wound Open Wounding Event: Gradually Appeared Status: Date Acquired: 02/04/2023 Comorbid Hypertension, Type II Diabetes, Osteoarthritis, Neuropathy, Weeks Of Treatment: 21 History: Received Radiation Clustered Wound: No Photos Wound Measurements Length: (cm) 0.4 Width: (cm) 0.5 Depth: (cm) 0.1 Area: (cm) 0.157 Volume: (cm) 0.016 % Reduction in Area: 98.6% % Reduction in Volume: 99.3% Epithelialization: Medium (34-66%) Tunneling: No Undermining: No Wound Description Classification: Grade 3 Wound Margin: Distinct, outline attached Exudate Amount: Medium Exudate Type: Serosanguineous Exudate Color: red, brown Foul Odor After Cleansing: No Slough/Fibrino No Wound Bed Granulation Amount: Large (67-100%) Exposed Structure Granulation Quality: Red Fascia Exposed: No Necrotic Amount: None Present (0%) Fat Layer (Subcutaneous Tissue) Exposed: Yes Tendon Exposed: No Muscle Exposed: No Joint Exposed: No Bone Exposed: No Periwound Skin Texture Texture Color No Abnormalities Noted: No No Abnormalities Noted: Yes Callus: Yes Temperature / Pain Temperature: No Abnormality Moisture No Abnormalities Noted: No Dry / ScalyJaedan [name], [name] ([id number]) [id number] Page 7 of 8 Maceration: No Treatment Notes Wound #8 (Toe Great) Wound Laterality: Right Cleanser Normal Saline Discharge Instruction: Cleanse the wound with Normal Saline prior to applying [name] clean dressing using gauze sponges, not tissue or cotton balls. Soap and Water Discharge Instruction: May shower and wash wound with dial antibacterial soap and water prior to dressing change. Peri-Wound Care Topical Primary Dressing Promogran Prisma Matrix, 4.34 (sq in) (silver collagen) Discharge Instruction: Moisten collagen with saline or hydrogel Secondary Dressing Optifoam Non-Adhesive Dressing, 4x4 in Discharge Instruction: Apply over  primary dressing cut to make donut Woven Gauze Sponges 2x2 in Discharge Instruction: Apply over primary dressing as directed. Secured With Conforming Stretch Gauze Bandage, Sterile 2x75 (in/in) Discharge Instruction: Secure with stretch gauze as directed. 59M Medipore H Soft Cloth Surgical T ape, 4 x 10 (in/yd) Discharge Instruction: Secure with tape as directed. Compression Wrap Compression Stockings Add-Ons

## 2023-07-30 NOTE — Progress Notes (Signed)
[name], [name] ([id number]) [id number] Page 1 of 11 Visit Report for 07/29/2023 Chief Complaint Document Details Patient Name: Date of Service: [name] 07/29/2023 8:15 A M Medical Record Number: [id number] Patient Account Number: [id number] Date of Birth/Sex: Treating RN: 02-16-1951 (72 y.o. M) Primary Care Provider: [name] Other Clinician: Referring Provider: Treating Provider/Extender: [name] in Treatment: 21 Information Obtained from: Patient Chief Complaint Bilateral T Ulcers oe 03/02/2023: right great toe ulcer with osteomyelitis Electronic Signature(s) Signed: 07/29/2023 9:01:01 AM By: [name] MD FACS Entered By: [name] on 07/29/2023 06:01:01 -------------------------------------------------------------------------------- Debridement Details Patient Name: Date of Service: [name] RD, A [name] 07/29/2023 8:15 A M Medical Record Number: [id number] Patient Account Number: [id number] Date of Birth/Sex: Treating RN: 1951-08-02 (72 y.o. [name] Primary Care Provider: [name] Other Clinician: Referring Provider: Treating Provider/Extender: [name] in Treatment: 21 Debridement Performed for Assessment: Wound #8 Right T Great oe Performed By: Physician [name], MD The following information was scribed by: [name] The information was scribed for: [name] Debridement Type: Debridement Severity of Tissue Pre Debridement: Fat layer exposed Level of Consciousness (Pre-procedure): Awake and Alert Pre-procedure Verification/Time Out Yes - 08:55 Taken: Start Time: 08:55 Percent of Wound Bed Debrided: 150% T Area Debrided (cm): otal 0.24 Tissue and other material debrided: Non-Viable, Callus, Slough, Skin: Epidermis, Slough Level: Skin/Epidermis Debridement Description: Selective/Open Wound Instrument:  Curette Bleeding: Minimum Hemostasis Achieved: Pressure Procedural Pain: 0 Post Procedural Pain: 0 Response to Treatment: Procedure was tolerated well Level of Consciousness (Post- Awake and Alert procedure): Post Debridement Measurements of Total Wound Length: (cm) 0.4 Width: (cm) 0.5 Depth: (cm) 0.1 [name] ([id number]) [id number] Page 2 of 11 Volume: (cm) 0.016 Character of Wound/Ulcer Post Debridement: Improved Severity of Tissue Post Debridement: Fat layer exposed Post Procedure Diagnosis Same as Pre-procedure Electronic Signature(s) Signed: 07/29/2023 9:26:20 AM By: [name] MD FACS Signed: 07/29/2023 5:18:43 PM By: [name] Deed RN, BSN Entered By: [name] on 07/29/2023 05:58:48 -------------------------------------------------------------------------------- HPI Details Patient Name: Date of Service: [name] RD, A [name] 07/29/2023 8:15 A M Medical Record Number: [id number] Patient Account Number: [id number] Date of Birth/Sex: Treating RN: 17-Feb-1951 (72 y.o. M) Primary Care Provider: [name] Other Clinician: Referring Provider: Treating Provider/Extender: [name] in Treatment: 21 History of Present Illness HPI Description: 09/26/2020 on evaluation today patient appears to be doing somewhat poorly in regard to his bilateral feet at this point due to issues that he is been having that developed about 2 weeks ago. He was walking home which was about a mile and states that he first noted the areas on the right foot beginning. These were blisters that develop. The left started Thanksgiving day when he was up standing more cooking. He did see his primary care provider who referred him to podiatry. He saw Dr. Allena Katz and Dr. Allena Katz recommended Betadine moistened gauze dressings to the wounds and to follow-up in 2 weeks. With that being said the patient subsequently was concerned about infection 2  to 3 days later as was his family member who is with him today. I believe this was a sister. With that being said subsequently he ended up going to urgent care at that point he was given Diflucan and Keflex he is done with both at this time. He is could be canceling the appointment with Dr. Allena Katz they tell me at this point. His most recent hemoglobin A1c was 6.6 on November 12. His ABIs were  layer exposed  03/09/2023 03/09/2023 Electronic Signature(s) Signed: 07/29/2023 9:00:49 AM By: [name] MD FACS Entered By: [name] on 07/29/2023 06:00:49 -------------------------------------------------------------------------------- Progress Note Details Patient Name: Date of Service: [name] 07/29/2023 8:15 A M Medical Record Number: [id number] Patient Account Number: [id number] Date of Birth/Sex: Treating RN: 03-Jan-1951 (72 y.o. M) Primary Care Provider: [name] Other Clinician: Referring Provider: Treating Provider/Extender: [name] in Treatment: 21 Subjective Chief Complaint Information obtained from Patient Bilateral T Ulcers 03/02/2023: right great toe ulcer with osteomyelitis oe History of Present Illness (HPI) 09/26/2020 on evaluation today patient appears to be doing somewhat poorly in regard to his bilateral feet at this point due to issues that he is been having that developed about 2 weeks ago. He was walking home which was about a mile and states that he first noted the areas on the right foot beginning. These were blisters that develop. The left started Thanksgiving day when he was up standing more cooking. He did see his primary care provider who referred him to podiatry. He saw Dr. Allena Katz and Dr. Allena Katz recommended Betadine moistened gauze dressings to the wounds and to follow-up in 2 weeks. With that being said the patient subsequently was concerned about infection 2 to 3 days later as was his family member who is with him today. I believe this was a sister. With that being said subsequently he ended up going to urgent care at that point he was given Diflucan and Keflex he is done with both at this time. He is could be canceling the appointment with Dr. Allena Katz they tell me at this point. His most recent hemoglobin A1c was 6.6 on November 12. His ABIs were 1.2 on the right and 1.03 on the left and appear to be doing excellent. With  that being said he does not have diabetic shoes and I feel like the shoes were likely the culprit for what led to this issue currently. There does not appear to be any signs of systemic infection nor local infection at this point which is great news. The patient does have a history of hypertension as well as potentially a recurrence of prostate cancer he is being followed by the cancer center for that at this point 10/03/2020 upon evaluation today patient appears to be doing better in regard to his toe ulcers. He has been tolerating the dressing changes without complication. Fortunately there is no signs of active infection at this time. The right great toe and fifth toe are the 2 worst out of everything here he has a couple that have healed on the left foot there is really nothing remaining open though he has several areas of callus on the left foot in fact 3 on the third fourth and fifth toes. That is going need to be removed today as well. 10/10/2020 upon evaluation today patient appears to be doing well in regard to his wounds. He still has wounds open on the first, second, and fifth toes of the right foot. Left foot is completely healed and the right fourth toe is also completely healed today. Overall very pleased with how things seem to be progressing. 10/17/2020 on evaluation today patient appears to be doing much better in regard to the right first, second, and fifth toes. He is making excellent progress and to be honest I am extremely pleased with where things stand today. Fortunately there is no signs of active infection at this time. No fevers, chills, nausea, vomiting, or  Positive for: Received Radiation Past Medical History Notes: Prostate cancer Immunizations Pneumococcal Vaccine: Received Pneumococcal Vaccination: No Implantable Devices None Family and Social History Cancer: No; Diabetes: Yes - Mother,Siblings; Heart Disease: No; Hereditary Spherocytosis: No; Hypertension: No; Kidney Disease: No; Lung Disease: No; Seizures: No; Stroke: No; Thyroid Problems: No; Tuberculosis: No; Never smoker; Marital Status - Single; Alcohol Use: Never; Drug Use: No History; Caffeine Use: Rarely; Financial Concerns: No; Food, Clothing or  Shelter Needs: No; Support System Lacking: No; Transportation Concerns: No Electronic Signature(s) Signed: 07/29/2023 9:26:20 AM By: [name] MD FACS Entered By: [name] on 07/29/2023 06:01:46 -------------------------------------------------------------------------------- SuperBill Details Patient Name: Date of Service: [name] RD, A [name] 07/29/2023 Medical Record Number: [id number] Patient Account Number: [id number] Date of Birth/Sex: Treating RN: [birth year] (72 y.o. M) Primary Care Provider: [name] Other Clinician: Referring Provider: Treating Provider/Extender: [name]: [address] [place] ([id number]) [id number] Page 11 of 11 ICD-10 Codes Code Description L97.514 Non-pressure chronic ulcer of other part of right foot with necrosis of bone M86.171 Other acute osteomyelitis, right ankle and foot E11.621 Type 2 diabetes mellitus with foot ulcer I10 Essential (primary) hypertension N18.32 Chronic kidney disease, stage 3b Facility Procedures : CPT4 Code: 78295621 Description: 30865 - DEBRIDE WOUND 1ST 20 SQ CM OR < ICD-10 Diagnosis Description L97.514 Non-pressure chronic ulcer of other part of right foot with necrosis of bone Modifier: Quantity: 1 Physician Procedures : CPT4 Code Description Modifier [id number] 99213 - WC PHYS LEVEL 3 - EST PT 25 ICD-10 Diagnosis Description L97.514 Non-pressure chronic ulcer of other part of right foot with necrosis of bone M86.171 Other acute osteomyelitis, right ankle and foot E11.621  Type 2 diabetes mellitus with foot ulcer N18.32 Chronic kidney disease, stage 3b Quantity: 1 : [id number] 97597 - WC PHYS DEBR WO ANESTH 20 SQ CM ICD-10 Diagnosis Description L97.514 Non-pressure chronic ulcer of other part of right foot with necrosis of bone Quantity: 1 Electronic Signature(s) Signed: 07/29/2023 9:03:43 AM By: [name] MD  FACS Entered By: [name] on 07/29/2023 06:03:42  oe Fat layer exposed. There was a Selective/Open Wound Skin/Epidermis Debridement with a total area of 0.24 sq cm performed by [name], MD. With the following instrument(s): Curette to remove Non-Viable tissue/material. Material removed includes Callus, Slough, and Skin: Epidermis. No specimens were taken. A time out was conducted at 08:55, prior to the start of the procedure. A Minimum amount of bleeding was controlled with Pressure. The procedure was tolerated well with a pain level of 0 throughout and a pain level of 0 following the procedure. Post Debridement Measurements: 0.4cm length x 0.5cm width x 0.1cm depth; 0.016cm^3 volume. Character of Wound/Ulcer Post Debridement is improved. Severity of Tissue Post Debridement is: Fat layer exposed. Post procedure Diagnosis Wound #8:  Same as Pre-Procedure Plan Follow-up Appointments: Return Appointment in 1 week. - Dr Lady Gary Rm 8 Tuesday 10/9 @ 08:30 am Anesthetic: (In clinic) Topical Lidocaine 4% applied to wound bed Bathing/ Shower/ Hygiene: May shower and wash wound with soap and water. Edema Control - Lymphedema / SCD / Other: Avoid standing for long periods of time. Exercise regularly Moisturize legs daily. Off-Loading: Wound #8 Right T Great: oe Other: - Wear the Darco front offloading sandal when walking, minimal weight bearing right foot WOUND #8: - T Great Wound Laterality: Right oe Cleanser: Normal Saline (Generic) 1 x Per Day/30 Days Discharge Instructions: Cleanse the wound with Normal Saline prior to applying a clean dressing using gauze sponges, not tissue or cotton balls. Cleanser: Soap and Water 1 x Per Day/30 Days Discharge Instructions: May shower and wash wound with dial antibacterial soap and water prior to dressing change. Prim Dressing: Promogran Prisma Matrix, 4.34 (sq in) (silver collagen) 1 x Per Day/30 Days ary Discharge Instructions: Moisten collagen with saline or hydrogel Secondary Dressing: Optifoam Non-Adhesive Dressing, 4x4 in 1 x Per Day/30 Days Discharge Instructions: Apply over primary dressing cut to make donut Secondary Dressing: Woven Gauze Sponges 2x2 in (Generic) 1 x Per Day/30 Days Discharge Instructions: Apply over primary dressing as directed. Secured With: Insurance underwriter, Sterile 2x75 (in/in) (Generic) 1 x Per Day/30 Days Discharge Instructions: Secure with stretch gauze as directed. Secured With: 34M Medipore H Soft Cloth Surgical T ape, 4 x 10 (in/yd) (Generic) 1 x Per Day/30 Days Discharge Instructions: Secure with tape as directed. 07/29/2023: The wound is smaller again this week. There is a little bit of callus accumulation around the edges and very minimal slough on the surface. I used a curette to debride callus, slough, and skin from the wound.  We will continue Prisma silver collagen. Continue foam donut for assistance with offloading. Follow-up in 1 week. Electronic Signature(s) Signed: 07/29/2023 9:03:23 AM By: [name] MD FACS Entered By: [name] on 07/29/2023 06:03:23 -------------------------------------------------------------------------------- HxROS Details Patient Name: Date of Service: [name] 07/29/2023 8:15 A M Medical Record Number: [id number] Patient Account Number: [id number] Date of Birth/Sex: Treating RN: 09/17/1951 (72 y.o. M) Primary Care Provider: [name] Other Clinician: Referring Provider: Treating Provider/Extender: [name] in Treatment: 21 Information Obtained From Patient LOURDES, MANNING ([id number]) [id number] Page 10 of 11 Eyes Medical History: Past Medical History Notes: Diabetic retinopathy Cardiovascular Medical History: Positive for: Hypertension Past Medical History Notes: Carotid artery stenosis Endocrine Medical History: Positive for: Type II Diabetes Negative for: Type I Diabetes Time with diabetes: 5 years Treated with: Insulin Blood sugar tested every day: Yes Tested : twice a day Musculoskeletal Medical History: Positive for: Osteoarthritis Neurologic Medical History: Positive for: Neuropathy Oncologic Medical History:  layer exposed  03/09/2023 03/09/2023 Electronic Signature(s) Signed: 07/29/2023 9:00:49 AM By: [name] MD FACS Entered By: [name] on 07/29/2023 06:00:49 -------------------------------------------------------------------------------- Progress Note Details Patient Name: Date of Service: [name] 07/29/2023 8:15 A M Medical Record Number: [id number] Patient Account Number: [id number] Date of Birth/Sex: Treating RN: 03-Jan-1951 (72 y.o. M) Primary Care Provider: [name] Other Clinician: Referring Provider: Treating Provider/Extender: [name] in Treatment: 21 Subjective Chief Complaint Information obtained from Patient Bilateral T Ulcers 03/02/2023: right great toe ulcer with osteomyelitis oe History of Present Illness (HPI) 09/26/2020 on evaluation today patient appears to be doing somewhat poorly in regard to his bilateral feet at this point due to issues that he is been having that developed about 2 weeks ago. He was walking home which was about a mile and states that he first noted the areas on the right foot beginning. These were blisters that develop. The left started Thanksgiving day when he was up standing more cooking. He did see his primary care provider who referred him to podiatry. He saw Dr. Allena Katz and Dr. Allena Katz recommended Betadine moistened gauze dressings to the wounds and to follow-up in 2 weeks. With that being said the patient subsequently was concerned about infection 2 to 3 days later as was his family member who is with him today. I believe this was a sister. With that being said subsequently he ended up going to urgent care at that point he was given Diflucan and Keflex he is done with both at this time. He is could be canceling the appointment with Dr. Allena Katz they tell me at this point. His most recent hemoglobin A1c was 6.6 on November 12. His ABIs were 1.2 on the right and 1.03 on the left and appear to be doing excellent. With  that being said he does not have diabetic shoes and I feel like the shoes were likely the culprit for what led to this issue currently. There does not appear to be any signs of systemic infection nor local infection at this point which is great news. The patient does have a history of hypertension as well as potentially a recurrence of prostate cancer he is being followed by the cancer center for that at this point 10/03/2020 upon evaluation today patient appears to be doing better in regard to his toe ulcers. He has been tolerating the dressing changes without complication. Fortunately there is no signs of active infection at this time. The right great toe and fifth toe are the 2 worst out of everything here he has a couple that have healed on the left foot there is really nothing remaining open though he has several areas of callus on the left foot in fact 3 on the third fourth and fifth toes. That is going need to be removed today as well. 10/10/2020 upon evaluation today patient appears to be doing well in regard to his wounds. He still has wounds open on the first, second, and fifth toes of the right foot. Left foot is completely healed and the right fourth toe is also completely healed today. Overall very pleased with how things seem to be progressing. 10/17/2020 on evaluation today patient appears to be doing much better in regard to the right first, second, and fifth toes. He is making excellent progress and to be honest I am extremely pleased with where things stand today. Fortunately there is no signs of active infection at this time. No fevers, chills, nausea, vomiting, or  [name], [name] ([id number]) [id number] Page 1 of 11 Visit Report for 07/29/2023 Chief Complaint Document Details Patient Name: Date of Service: [name] 07/29/2023 8:15 A M Medical Record Number: [id number] Patient Account Number: [id number] Date of Birth/Sex: Treating RN: 02-16-1951 (72 y.o. M) Primary Care Provider: [name] Other Clinician: Referring Provider: Treating Provider/Extender: [name] in Treatment: 21 Information Obtained from: Patient Chief Complaint Bilateral T Ulcers oe 03/02/2023: right great toe ulcer with osteomyelitis Electronic Signature(s) Signed: 07/29/2023 9:01:01 AM By: [name] MD FACS Entered By: [name] on 07/29/2023 06:01:01 -------------------------------------------------------------------------------- Debridement Details Patient Name: Date of Service: [name] RD, A [name] 07/29/2023 8:15 A M Medical Record Number: [id number] Patient Account Number: [id number] Date of Birth/Sex: Treating RN: 1951-08-02 (72 y.o. [name] Primary Care Provider: [name] Other Clinician: Referring Provider: Treating Provider/Extender: [name] in Treatment: 21 Debridement Performed for Assessment: Wound #8 Right T Great oe Performed By: Physician [name], MD The following information was scribed by: [name] The information was scribed for: [name] Debridement Type: Debridement Severity of Tissue Pre Debridement: Fat layer exposed Level of Consciousness (Pre-procedure): Awake and Alert Pre-procedure Verification/Time Out Yes - 08:55 Taken: Start Time: 08:55 Percent of Wound Bed Debrided: 150% T Area Debrided (cm): otal 0.24 Tissue and other material debrided: Non-Viable, Callus, Slough, Skin: Epidermis, Slough Level: Skin/Epidermis Debridement Description: Selective/Open Wound Instrument:  Curette Bleeding: Minimum Hemostasis Achieved: Pressure Procedural Pain: 0 Post Procedural Pain: 0 Response to Treatment: Procedure was tolerated well Level of Consciousness (Post- Awake and Alert procedure): Post Debridement Measurements of Total Wound Length: (cm) 0.4 Width: (cm) 0.5 Depth: (cm) 0.1 [name] ([id number]) [id number] Page 2 of 11 Volume: (cm) 0.016 Character of Wound/Ulcer Post Debridement: Improved Severity of Tissue Post Debridement: Fat layer exposed Post Procedure Diagnosis Same as Pre-procedure Electronic Signature(s) Signed: 07/29/2023 9:26:20 AM By: [name] MD FACS Signed: 07/29/2023 5:18:43 PM By: [name] Deed RN, BSN Entered By: [name] on 07/29/2023 05:58:48 -------------------------------------------------------------------------------- HPI Details Patient Name: Date of Service: [name] RD, A [name] 07/29/2023 8:15 A M Medical Record Number: [id number] Patient Account Number: [id number] Date of Birth/Sex: Treating RN: 17-Feb-1951 (72 y.o. M) Primary Care Provider: [name] Other Clinician: Referring Provider: Treating Provider/Extender: [name] in Treatment: 21 History of Present Illness HPI Description: 09/26/2020 on evaluation today patient appears to be doing somewhat poorly in regard to his bilateral feet at this point due to issues that he is been having that developed about 2 weeks ago. He was walking home which was about a mile and states that he first noted the areas on the right foot beginning. These were blisters that develop. The left started Thanksgiving day when he was up standing more cooking. He did see his primary care provider who referred him to podiatry. He saw Dr. Allena Katz and Dr. Allena Katz recommended Betadine moistened gauze dressings to the wounds and to follow-up in 2 weeks. With that being said the patient subsequently was concerned about infection 2  to 3 days later as was his family member who is with him today. I believe this was a sister. With that being said subsequently he ended up going to urgent care at that point he was given Diflucan and Keflex he is done with both at this time. He is could be canceling the appointment with Dr. Allena Katz they tell me at this point. His most recent hemoglobin A1c was 6.6 on November 12. His ABIs were  oe Fat layer exposed. There was a Selective/Open Wound Skin/Epidermis Debridement with a total area of 0.24 sq cm performed by [name], MD. With the following instrument(s): Curette to remove Non-Viable tissue/material. Material removed includes Callus, Slough, and Skin: Epidermis. No specimens were taken. A time out was conducted at 08:55, prior to the start of the procedure. A Minimum amount of bleeding was controlled with Pressure. The procedure was tolerated well with a pain level of 0 throughout and a pain level of 0 following the procedure. Post Debridement Measurements: 0.4cm length x 0.5cm width x 0.1cm depth; 0.016cm^3 volume. Character of Wound/Ulcer Post Debridement is improved. Severity of Tissue Post Debridement is: Fat layer exposed. Post procedure Diagnosis Wound #8:  Same as Pre-Procedure Plan Follow-up Appointments: Return Appointment in 1 week. - Dr Lady Gary Rm 8 Tuesday 10/9 @ 08:30 am Anesthetic: (In clinic) Topical Lidocaine 4% applied to wound bed Bathing/ Shower/ Hygiene: May shower and wash wound with soap and water. Edema Control - Lymphedema / SCD / Other: Avoid standing for long periods of time. Exercise regularly Moisturize legs daily. Off-Loading: Wound #8 Right T Great: oe Other: - Wear the [employer] front offloading sandal when walking, minimal weight bearing right foot WOUND #8: - T Great Wound Laterality: Right oe Cleanser: Normal Saline (Generic) 1 x Per Day/30 Days Discharge Instructions: Cleanse the wound with Normal Saline prior to applying a clean dressing using gauze sponges, not tissue or cotton balls. Cleanser: Soap and Water 1 x Per Day/30 Days Discharge Instructions: May shower and wash wound with dial antibacterial soap and water prior to dressing change. Prim Dressing: Promogran Prisma Matrix, 4.34 (sq in) (silver collagen) 1 x Per Day/30 Days ary Discharge Instructions: Moisten collagen with saline or hydrogel Secondary Dressing: Optifoam Non-Adhesive Dressing, 4x4 in 1 x Per Day/30 Days Discharge Instructions: Apply over primary dressing cut to make donut Secondary Dressing: Woven Gauze Sponges 2x2 in (Generic) 1 x Per Day/30 Days Discharge Instructions: Apply over primary dressing as directed. Secured With: Insurance underwriter, Sterile 2x75 (in/in) (Generic) 1 x Per Day/30 Days Discharge Instructions: Secure with stretch gauze as directed. Secured With: 34M Medipore H Soft Cloth Surgical T ape, 4 x 10 (in/yd) (Generic) 1 x Per Day/30 Days Discharge Instructions: Secure with tape as directed. 07/29/2023: The wound is smaller again this week. There is a little bit of callus accumulation around the edges and very minimal slough on the surface. I used a curette to debride callus, slough, and skin from the wound.  We will continue Prisma silver collagen. Continue foam donut for assistance with offloading. Follow-up in 1 week. Electronic Signature(s) Signed: 07/29/2023 9:03:23 AM By: [name] MD FACS Entered By: [name] on 07/29/2023 06:03:23 -------------------------------------------------------------------------------- HxROS Details Patient Name: Date of Service: [name] 07/29/2023 8:15 A M Medical Record Number: [id number] Patient Account Number: [id number] Date of Birth/Sex: Treating RN: 09/17/1951 (72 y.o. M) Primary Care Provider: [name] Other Clinician: Referring Provider: Treating Provider/Extender: [name] in Treatment: 21 Information Obtained From Patient LOURDES, MANNING ([id number]) [id number] Page 10 of 11 Eyes Medical History: Past Medical History Notes: Diabetic retinopathy Cardiovascular Medical History: Positive for: Hypertension Past Medical History Notes: Carotid artery stenosis Endocrine Medical History: Positive for: Type II Diabetes Negative for: Type I Diabetes Time with diabetes: 5 years Treated with: Insulin Blood sugar tested every day: Yes Tested : twice a day Musculoskeletal Medical History: Positive for: Osteoarthritis Neurologic Medical History: Positive for: Neuropathy Oncologic Medical History:  [name], [name] ([id number]) [id number] Page 1 of 11 Visit Report for 07/29/2023 Chief Complaint Document Details Patient Name: Date of Service: [name] 07/29/2023 8:15 A M Medical Record Number: [id number] Patient Account Number: [id number] Date of Birth/Sex: Treating RN: 02-16-1951 (72 y.o. M) Primary Care Provider: [name] Other Clinician: Referring Provider: Treating Provider/Extender: [name] in Treatment: 21 Information Obtained from: Patient Chief Complaint Bilateral T Ulcers oe 03/02/2023: right great toe ulcer with osteomyelitis Electronic Signature(s) Signed: 07/29/2023 9:01:01 AM By: [name] MD FACS Entered By: [name] on 07/29/2023 06:01:01 -------------------------------------------------------------------------------- Debridement Details Patient Name: Date of Service: [name] RD, A [name] 07/29/2023 8:15 A M Medical Record Number: [id number] Patient Account Number: [id number] Date of Birth/Sex: Treating RN: 1951-08-02 (72 y.o. [name] Primary Care Provider: [name] Other Clinician: Referring Provider: Treating Provider/Extender: [name] in Treatment: 21 Debridement Performed for Assessment: Wound #8 Right T Great oe Performed By: Physician [name], MD The following information was scribed by: [name] The information was scribed for: [name] Debridement Type: Debridement Severity of Tissue Pre Debridement: Fat layer exposed Level of Consciousness (Pre-procedure): Awake and Alert Pre-procedure Verification/Time Out Yes - 08:55 Taken: Start Time: 08:55 Percent of Wound Bed Debrided: 150% T Area Debrided (cm): otal 0.24 Tissue and other material debrided: Non-Viable, Callus, Slough, Skin: Epidermis, Slough Level: Skin/Epidermis Debridement Description: Selective/Open Wound Instrument:  Curette Bleeding: Minimum Hemostasis Achieved: Pressure Procedural Pain: 0 Post Procedural Pain: 0 Response to Treatment: Procedure was tolerated well Level of Consciousness (Post- Awake and Alert procedure): Post Debridement Measurements of Total Wound Length: (cm) 0.4 Width: (cm) 0.5 Depth: (cm) 0.1 [name] ([id number]) [id number] Page 2 of 11 Volume: (cm) 0.016 Character of Wound/Ulcer Post Debridement: Improved Severity of Tissue Post Debridement: Fat layer exposed Post Procedure Diagnosis Same as Pre-procedure Electronic Signature(s) Signed: 07/29/2023 9:26:20 AM By: [name] MD FACS Signed: 07/29/2023 5:18:43 PM By: [name] Deed RN, BSN Entered By: [name] on 07/29/2023 05:58:48 -------------------------------------------------------------------------------- HPI Details Patient Name: Date of Service: [name] RD, A [name] 07/29/2023 8:15 A M Medical Record Number: [id number] Patient Account Number: [id number] Date of Birth/Sex: Treating RN: 17-Feb-1951 (72 y.o. M) Primary Care Provider: [name] Other Clinician: Referring Provider: Treating Provider/Extender: [name] in Treatment: 21 History of Present Illness HPI Description: 09/26/2020 on evaluation today patient appears to be doing somewhat poorly in regard to his bilateral feet at this point due to issues that he is been having that developed about 2 weeks ago. He was walking home which was about a mile and states that he first noted the areas on the right foot beginning. These were blisters that develop. The left started Thanksgiving day when he was up standing more cooking. He did see his primary care provider who referred him to podiatry. He saw Dr. Allena Katz and Dr. Allena Katz recommended Betadine moistened gauze dressings to the wounds and to follow-up in 2 weeks. With that being said the patient subsequently was concerned about infection 2  to 3 days later as was his family member who is with him today. I believe this was a sister. With that being said subsequently he ended up going to urgent care at that point he was given Diflucan and Keflex he is done with both at this time. He is could be canceling the appointment with Dr. Allena Katz they tell me at this point. His most recent hemoglobin A1c was 6.6 on November 12. His ABIs were  oe Fat layer exposed. There was a Selective/Open Wound Skin/Epidermis Debridement with a total area of 0.24 sq cm performed by [name], MD. With the following instrument(s): Curette to remove Non-Viable tissue/material. Material removed includes Callus, Slough, and Skin: Epidermis. No specimens were taken. A time out was conducted at 08:55, prior to the start of the procedure. A Minimum amount of bleeding was controlled with Pressure. The procedure was tolerated well with a pain level of 0 throughout and a pain level of 0 following the procedure. Post Debridement Measurements: 0.4cm length x 0.5cm width x 0.1cm depth; 0.016cm^3 volume. Character of Wound/Ulcer Post Debridement is improved. Severity of Tissue Post Debridement is: Fat layer exposed. Post procedure Diagnosis Wound #8:  Same as Pre-Procedure Plan Follow-up Appointments: Return Appointment in 1 week. - Dr Lady Gary Rm 8 Tuesday 10/9 @ 08:30 am Anesthetic: (In clinic) Topical Lidocaine 4% applied to wound bed Bathing/ Shower/ Hygiene: May shower and wash wound with soap and water. Edema Control - Lymphedema / SCD / Other: Avoid standing for long periods of time. Exercise regularly Moisturize legs daily. Off-Loading: Wound #8 Right T Great: oe Other: - Wear the Darco front offloading sandal when walking, minimal weight bearing right foot WOUND #8: - T Great Wound Laterality: Right oe Cleanser: Normal Saline (Generic) 1 x Per Day/30 Days Discharge Instructions: Cleanse the wound with Normal Saline prior to applying a clean dressing using gauze sponges, not tissue or cotton balls. Cleanser: Soap and Water 1 x Per Day/30 Days Discharge Instructions: May shower and wash wound with dial antibacterial soap and water prior to dressing change. Prim Dressing: Promogran Prisma Matrix, 4.34 (sq in) (silver collagen) 1 x Per Day/30 Days ary Discharge Instructions: Moisten collagen with saline or hydrogel Secondary Dressing: Optifoam Non-Adhesive Dressing, 4x4 in 1 x Per Day/30 Days Discharge Instructions: Apply over primary dressing cut to make donut Secondary Dressing: Woven Gauze Sponges 2x2 in (Generic) 1 x Per Day/30 Days Discharge Instructions: Apply over primary dressing as directed. Secured With: Insurance underwriter, Sterile 2x75 (in/in) (Generic) 1 x Per Day/30 Days Discharge Instructions: Secure with stretch gauze as directed. Secured With: 34M Medipore H Soft Cloth Surgical T ape, 4 x 10 (in/yd) (Generic) 1 x Per Day/30 Days Discharge Instructions: Secure with tape as directed. 07/29/2023: The wound is smaller again this week. There is a little bit of callus accumulation around the edges and very minimal slough on the surface. I used a curette to debride callus, slough, and skin from the wound.  We will continue Prisma silver collagen. Continue foam donut for assistance with offloading. Follow-up in 1 week. Electronic Signature(s) Signed: 07/29/2023 9:03:23 AM By: [name] MD FACS Entered By: [name] on 07/29/2023 06:03:23 -------------------------------------------------------------------------------- HxROS Details Patient Name: Date of Service: [name] 07/29/2023 8:15 A M Medical Record Number: [id number] Patient Account Number: [id number] Date of Birth/Sex: Treating RN: 09/17/1951 (72 y.o. M) Primary Care Provider: [name] Other Clinician: Referring Provider: Treating Provider/Extender: [name] in Treatment: 21 Information Obtained From Patient LOURDES, MANNING ([id number]) [id number] Page 10 of 11 Eyes Medical History: Past Medical History Notes: Diabetic retinopathy Cardiovascular Medical History: Positive for: Hypertension Past Medical History Notes: Carotid artery stenosis Endocrine Medical History: Positive for: Type II Diabetes Negative for: Type I Diabetes Time with diabetes: 5 years Treated with: Insulin Blood sugar tested every day: Yes Tested : twice a day Musculoskeletal Medical History: Positive for: Osteoarthritis Neurologic Medical History: Positive for: Neuropathy Oncologic Medical History:  layer exposed  03/09/2023 03/09/2023 Electronic Signature(s) Signed: 07/29/2023 9:00:49 AM By: [name] MD FACS Entered By: [name] on 07/29/2023 06:00:49 -------------------------------------------------------------------------------- Progress Note Details Patient Name: Date of Service: [name] 07/29/2023 8:15 A M Medical Record Number: [id number] Patient Account Number: [id number] Date of Birth/Sex: Treating RN: 03-Jan-1951 (72 y.o. M) Primary Care Provider: [name] Other Clinician: Referring Provider: Treating Provider/Extender: [name] in Treatment: 21 Subjective Chief Complaint Information obtained from Patient Bilateral T Ulcers 03/02/2023: right great toe ulcer with osteomyelitis oe History of Present Illness (HPI) 09/26/2020 on evaluation today patient appears to be doing somewhat poorly in regard to his bilateral feet at this point due to issues that he is been having that developed about 2 weeks ago. He was walking home which was about a mile and states that he first noted the areas on the right foot beginning. These were blisters that develop. The left started Thanksgiving day when he was up standing more cooking. He did see his primary care provider who referred him to podiatry. He saw Dr. Allena Katz and Dr. Allena Katz recommended Betadine moistened gauze dressings to the wounds and to follow-up in 2 weeks. With that being said the patient subsequently was concerned about infection 2 to 3 days later as was his family member who is with him today. I believe this was a sister. With that being said subsequently he ended up going to urgent care at that point he was given Diflucan and Keflex he is done with both at this time. He is could be canceling the appointment with Dr. Allena Katz they tell me at this point. His most recent hemoglobin A1c was 6.6 on November 12. His ABIs were 1.2 on the right and 1.03 on the left and appear to be doing excellent. With  that being said he does not have diabetic shoes and I feel like the shoes were likely the culprit for what led to this issue currently. There does not appear to be any signs of systemic infection nor local infection at this point which is great news. The patient does have a history of hypertension as well as potentially a recurrence of prostate cancer he is being followed by the cancer center for that at this point 10/03/2020 upon evaluation today patient appears to be doing better in regard to his toe ulcers. He has been tolerating the dressing changes without complication. Fortunately there is no signs of active infection at this time. The right great toe and fifth toe are the 2 worst out of everything here he has a couple that have healed on the left foot there is really nothing remaining open though he has several areas of callus on the left foot in fact 3 on the third fourth and fifth toes. That is going need to be removed today as well. 10/10/2020 upon evaluation today patient appears to be doing well in regard to his wounds. He still has wounds open on the first, second, and fifth toes of the right foot. Left foot is completely healed and the right fourth toe is also completely healed today. Overall very pleased with how things seem to be progressing. 10/17/2020 on evaluation today patient appears to be doing much better in regard to the right first, second, and fifth toes. He is making excellent progress and to be honest I am extremely pleased with where things stand today. Fortunately there is no signs of active infection at this time. No fevers, chills, nausea, vomiting, or  callus accumulation around the edges and very minimal slough on the surface. Electronic Signature(s) Signed: 07/29/2023 9:02:07 AM By: [name] MD FACS Entered By: [name] on 07/29/2023 06:02:07 -------------------------------------------------------------------------------- Physician Orders Details Patient Name: Date of Service: [name] 07/29/2023 8:15 A M Medical  Record Number: [id number] Patient Account Number: [id number] Date of Birth/Sex: Treating RN: April 12, 1951 (72 y.o. [name] Primary Care Provider: [name] Other Clinician: Referring Provider: Treating Provider/Extender: [name] in Treatment: 21 The following information was scribed by: [name] The information was scribed for: [name] Verbal / Phone Orders: No Diagnosis Coding ICD-10 Coding Code Description L97.514 Non-pressure chronic ulcer of other part of right foot with necrosis of bone M86.171 Other acute osteomyelitis, right ankle and foot E11.621 Type 2 diabetes mellitus with foot ulcer I10 Essential (primary) hypertension N18.32 Chronic kidney disease, stage 3b Follow-up Appointments ppointment in 1 week. - Dr Lady Gary Rm 8 Return A Tuesday 10/9 @ 08:30 am Anesthetic (In clinic) Topical Lidocaine 4% applied to wound bed Bathing/ Shower/ Hygiene May shower and wash wound with soap and water. Edema Control - Lymphedema / SCD / Other Bilateral Lower Extremities Avoid standing for long periods of time. Exercise regularly DAVON, ABDELAZIZ ([id number]) [phone].pdf Page 5 of 11 Moisturize legs daily. Off-Loading Wound #8 Right T Great oe Other: - Wear the Darco front offloading sandal when walking, minimal weight bearing right foot Wound Treatment Wound #8 - T Great oe Wound Laterality: Right Cleanser: Normal Saline (Generic) 1 x Per Day/30 Days Discharge Instructions: Cleanse the wound with Normal Saline prior to applying a clean dressing using gauze sponges, not tissue or cotton balls. Cleanser: Soap and Water 1 x Per Day/30 Days Discharge Instructions: May shower and wash wound with dial antibacterial soap and water prior to dressing change. Prim Dressing: Promogran Prisma Matrix, 4.34 (sq in) (silver collagen) 1 x Per Day/30 Days ary Discharge Instructions: Moisten collagen with  saline or hydrogel Secondary Dressing: Optifoam Non-Adhesive Dressing, 4x4 in 1 x Per Day/30 Days Discharge Instructions: Apply over primary dressing cut to make donut Secondary Dressing: Woven Gauze Sponges 2x2 in (Generic) 1 x Per Day/30 Days Discharge Instructions: Apply over primary dressing as directed. Secured With: Insurance underwriter, Sterile 2x75 (in/in) (Generic) 1 x Per Day/30 Days Discharge Instructions: Secure with stretch gauze as directed. Secured With: 73M Medipore H Soft Cloth Surgical T ape, 4 x 10 (in/yd) (Generic) 1 x Per Day/30 Days Discharge Instructions: Secure with tape as directed. Electronic Signature(s) Signed: 07/29/2023 9:26:20 AM By: [name] MD FACS Entered By: [name] on 07/29/2023 06:02:50 -------------------------------------------------------------------------------- Problem List Details Patient Name: Date of Service: [name] RD, A [name] 07/29/2023 8:15 A M Medical Record Number: [id number] Patient Account Number: [id number] Date of Birth/Sex: Treating RN: 12/02/1950 (73 y.o. [name] Primary Care Provider: [name] Other Clinician: Referring Provider: Treating Provider/Extender: [name] in Treatment: 21 Active Problems ICD-10 Encounter Code Description Active Date MDM Diagnosis L97.514 Non-pressure chronic ulcer of other part of right foot with necrosis of bone 03/02/2023 No Yes M86.171 Other acute osteomyelitis, right ankle and foot 03/02/2023 No Yes E11.621 Type 2 diabetes mellitus with foot ulcer 03/02/2023 No Yes I10 Essential (primary) hypertension 03/02/2023 No Yes N18.32 Chronic kidney disease, stage 3b 03/02/2023 No Yes Witherell, Torryn ([id number]) [id number] Page 6 of 11 Inactive Problems Resolved Problems ICD-10 Code Description Active Date Resolved Date L97.512 Non-pressure chronic ulcer of other part of right foot with fat

## 2023-07-31 ENCOUNTER — Other Ambulatory Visit: Payer: Self-pay | Admitting: Family Medicine

## 2023-07-31 DIAGNOSIS — Z1212 Encounter for screening for malignant neoplasm of rectum: Secondary | ICD-10-CM

## 2023-07-31 DIAGNOSIS — Z1211 Encounter for screening for malignant neoplasm of colon: Secondary | ICD-10-CM

## 2023-08-05 ENCOUNTER — Encounter (HOSPITAL_BASED_OUTPATIENT_CLINIC_OR_DEPARTMENT_OTHER): Payer: Medicare HMO | Admitting: General Surgery

## 2023-08-05 DIAGNOSIS — L97514 Non-pressure chronic ulcer of other part of right foot with necrosis of bone: Secondary | ICD-10-CM | POA: Diagnosis not present

## 2023-08-05 DIAGNOSIS — E11621 Type 2 diabetes mellitus with foot ulcer: Secondary | ICD-10-CM | POA: Diagnosis not present

## 2023-08-05 DIAGNOSIS — E1169 Type 2 diabetes mellitus with other specified complication: Secondary | ICD-10-CM | POA: Diagnosis not present

## 2023-08-05 DIAGNOSIS — E1122 Type 2 diabetes mellitus with diabetic chronic kidney disease: Secondary | ICD-10-CM | POA: Diagnosis not present

## 2023-08-05 DIAGNOSIS — N1832 Chronic kidney disease, stage 3b: Secondary | ICD-10-CM | POA: Diagnosis not present

## 2023-08-05 DIAGNOSIS — M86171 Other acute osteomyelitis, right ankle and foot: Secondary | ICD-10-CM | POA: Diagnosis not present

## 2023-08-05 DIAGNOSIS — L97512 Non-pressure chronic ulcer of other part of right foot with fat layer exposed: Secondary | ICD-10-CM | POA: Diagnosis not present

## 2023-08-05 DIAGNOSIS — I129 Hypertensive chronic kidney disease with stage 1 through stage 4 chronic kidney disease, or unspecified chronic kidney disease: Secondary | ICD-10-CM | POA: Diagnosis not present

## 2023-08-05 NOTE — Progress Notes (Signed)
Date: 03/31/2023 Goal Status: Met [name], [name] ([id number]) [id number] Page 5 of 8 Ulcer/skin breakdown will have a volume reduction of 50% by week 8 Date Initiated: 04/02/2023 Date Inactivated: 05/07/2023 Target Resolution Date: 04/30/2023 Goal Status: Met Interventions: Assess patient/caregiver ability to obtain necessary supplies Assess patient/caregiver ability to perform ulcer/skin care regimen upon admission and as needed Assess ulceration(s) every visit Treatment Activities: Skin care regimen initiated : 03/17/2023 Topical wound management  initiated : 03/17/2023 Notes: Electronic Signature(s) Signed: 08/05/2023 2:53:36 PM By: Redmond Pulling RN, BSN Entered By: Redmond Pulling on 08/05/2023 05:53:23 -------------------------------------------------------------------------------- Pain Assessment Details Patient Name: Date of Service: [name] RD, [name] 08/05/2023 8:30 A M Medical Record Number: [id number] Patient Account Number: [id number] Date of Birth/Sex: Treating RN: 1950/11/04 (72 y.o. [name] Shantoya Geurts: [name] Other Clinician: Referring Linda Biehn: Treating Ressie Slevin/Extender: [name]: 22 Active Problems Location of Pain Severity and Description of Pain Patient Has Paino No Site Locations Pain Management and Medication Current Pain Management: Electronic Signature(s) Signed: 08/05/2023 2:53:36 PM By: Redmond Pulling RN, BSN Entered By: Redmond Pulling on 08/05/2023 05:52:39 Cybulski, [name] ([id number]) [id number] Page 6 of 8 -------------------------------------------------------------------------------- Patient/Caregiver Education Details Patient Name: Date of Service: [name] 10/9/2024andnbsp8:30 A M Medical Record Number: [id number] Patient Account Number: [id number] Date of Birth/Gender: Treating RN: April 15, 1951 (72 y.o. [name] Physician: [name] Other Clinician: Referring Physician: Treating Physician/Extender: [name] in Treatment: 22 Education Assessment Education Provided To: Patient Education Topics Provided Wound/Skin Impairment: Methods: Explain/Verbal Responses: State content correctly [employer]) Signed: 08/05/2023 2:53:36 PM By: Redmond Pulling RN, BSN Entered By: Redmond Pulling on 08/05/2023 05:53:40 -------------------------------------------------------------------------------- Wound Assessment Details Patient Name:  Date of Service: [name] RD, [name] 08/05/2023 8:30 A M Medical Record Number: [id number] Patient Account Number: [id number] Date of Birth/Sex: Treating RN: 08-Feb-1951 (72 y.o. [name] Sedra Morfin: [name] Other Clinician: Referring Melvin Marmo: Treating Shylin Keizer/Extender: [name]: 22 Wound Status Wound Number: 8 Primary Diabetic Wound/Ulcer of the Lower Extremity Etiology: Wound Location: Right T Great oe Wound Open Wounding Event: Gradually Appeared Status: Date Acquired: 02/04/2023 Comorbid Hypertension, Type II Diabetes, Osteoarthritis, Neuropathy, Weeks Of Treatment: 22 History: Received Radiation Clustered Wound: No Photos Wound Measurements Length: (cm) 0.6 Width: (cm) 0.6 Depth: (cm) 0.2 Area: (cm) 0.283 Volume: (cm) 0.057 % Reduction in Area: 97.5% % Reduction in Volume: 97.5% Epithelialization: Large (67-100%) Tunneling: No Undermining: No Wound Description [place], Peighton ([id number]) Classification: Grade 3 Wound Margin: Distinct, outline attached Exudate Amount: Medium Exudate Type: Serosanguineous Exudate Color: red, brown [id number] Page 7 of 8 Foul Odor After Cleansing: No Slough/Fibrino No Wound Bed Granulation Amount: Large (67-100%) Exposed Structure Granulation Quality: Red Fascia Exposed: No Necrotic Amount: None Present (0%) Fat Layer (Subcutaneous Tissue) Exposed: Yes Tendon Exposed: No Muscle Exposed: No Joint Exposed: No Bone Exposed: No Periwound Skin Texture Texture Color No Abnormalities Noted: No No Abnormalities Noted: Yes Callus: Yes Temperature / Pain Temperature: No Abnormality Moisture No Abnormalities Noted: No Dry / Scaly: Yes Maceration: No Treatment Notes Wound #8 (Toe Great) Wound Laterality: Right Cleanser Normal Saline Discharge Instruction: Cleanse the wound with Normal Saline prior to applying a clean dressing using  gauze sponges, not tissue or cotton balls. Soap and Water Discharge Instruction: May shower and wash wound with dial antibacterial soap and water prior to dressing change. Peri-Wound Care Topical Primary Dressing Promogran Prisma Matrix, 4.34 (sq in) (silver collagen) Discharge  Date: 03/31/2023 Goal Status: Met [name], [name] ([id number]) [id number] Page 5 of 8 Ulcer/skin breakdown will have a volume reduction of 50% by week 8 Date Initiated: 04/02/2023 Date Inactivated: 05/07/2023 Target Resolution Date: 04/30/2023 Goal Status: Met Interventions: Assess patient/caregiver ability to obtain necessary supplies Assess patient/caregiver ability to perform ulcer/skin care regimen upon admission and as needed Assess ulceration(s) every visit Treatment Activities: Skin care regimen initiated : 03/17/2023 Topical wound management  initiated : 03/17/2023 Notes: Electronic Signature(s) Signed: 08/05/2023 2:53:36 PM By: Redmond Pulling RN, BSN Entered By: Redmond Pulling on 08/05/2023 05:53:23 -------------------------------------------------------------------------------- Pain Assessment Details Patient Name: Date of Service: [name] RD, [name] 08/05/2023 8:30 A M Medical Record Number: [id number] Patient Account Number: [id number] Date of Birth/Sex: Treating RN: 1950/11/04 (72 y.o. [name] Shantoya Geurts: [name] Other Clinician: Referring Linda Biehn: Treating Ressie Slevin/Extender: [name]: 22 Active Problems Location of Pain Severity and Description of Pain Patient Has Paino No Site Locations Pain Management and Medication Current Pain Management: Electronic Signature(s) Signed: 08/05/2023 2:53:36 PM By: Redmond Pulling RN, BSN Entered By: Redmond Pulling on 08/05/2023 05:52:39 Cybulski, [name] ([id number]) [id number] Page 6 of 8 -------------------------------------------------------------------------------- Patient/Caregiver Education Details Patient Name: Date of Service: [name] 10/9/2024andnbsp8:30 A M Medical Record Number: [id number] Patient Account Number: [id number] Date of Birth/Gender: Treating RN: April 15, 1951 (72 y.o. [name] Physician: [name] Other Clinician: Referring Physician: Treating Physician/Extender: [name] in Treatment: 22 Education Assessment Education Provided To: Patient Education Topics Provided Wound/Skin Impairment: Methods: Explain/Verbal Responses: State content correctly [employer]) Signed: 08/05/2023 2:53:36 PM By: Redmond Pulling RN, BSN Entered By: Redmond Pulling on 08/05/2023 05:53:40 -------------------------------------------------------------------------------- Wound Assessment Details Patient Name:  Date of Service: [name] RD, [name] 08/05/2023 8:30 A M Medical Record Number: [id number] Patient Account Number: [id number] Date of Birth/Sex: Treating RN: 08-Feb-1951 (72 y.o. [name] Sedra Morfin: [name] Other Clinician: Referring Melvin Marmo: Treating Shylin Keizer/Extender: [name]: 22 Wound Status Wound Number: 8 Primary Diabetic Wound/Ulcer of the Lower Extremity Etiology: Wound Location: Right T Great oe Wound Open Wounding Event: Gradually Appeared Status: Date Acquired: 02/04/2023 Comorbid Hypertension, Type II Diabetes, Osteoarthritis, Neuropathy, Weeks Of Treatment: 22 History: Received Radiation Clustered Wound: No Photos Wound Measurements Length: (cm) 0.6 Width: (cm) 0.6 Depth: (cm) 0.2 Area: (cm) 0.283 Volume: (cm) 0.057 % Reduction in Area: 97.5% % Reduction in Volume: 97.5% Epithelialization: Large (67-100%) Tunneling: No Undermining: No Wound Description [place], Peighton ([id number]) Classification: Grade 3 Wound Margin: Distinct, outline attached Exudate Amount: Medium Exudate Type: Serosanguineous Exudate Color: red, brown [id number] Page 7 of 8 Foul Odor After Cleansing: No Slough/Fibrino No Wound Bed Granulation Amount: Large (67-100%) Exposed Structure Granulation Quality: Red Fascia Exposed: No Necrotic Amount: None Present (0%) Fat Layer (Subcutaneous Tissue) Exposed: Yes Tendon Exposed: No Muscle Exposed: No Joint Exposed: No Bone Exposed: No Periwound Skin Texture Texture Color No Abnormalities Noted: No No Abnormalities Noted: Yes Callus: Yes Temperature / Pain Temperature: No Abnormality Moisture No Abnormalities Noted: No Dry / Scaly: Yes Maceration: No Treatment Notes Wound #8 (Toe Great) Wound Laterality: Right Cleanser Normal Saline Discharge Instruction: Cleanse the wound with Normal Saline prior to applying a clean dressing using  gauze sponges, not tissue or cotton balls. Soap and Water Discharge Instruction: May shower and wash wound with dial antibacterial soap and water prior to dressing change. Peri-Wound Care Topical Primary Dressing Promogran Prisma Matrix, 4.34 (sq in) (silver collagen) Discharge  Date: 03/31/2023 Goal Status: Met [name], [name] ([id number]) [id number] Page 5 of 8 Ulcer/skin breakdown will have a volume reduction of 50% by week 8 Date Initiated: 04/02/2023 Date Inactivated: 05/07/2023 Target Resolution Date: 04/30/2023 Goal Status: Met Interventions: Assess patient/caregiver ability to obtain necessary supplies Assess patient/caregiver ability to perform ulcer/skin care regimen upon admission and as needed Assess ulceration(s) every visit Treatment Activities: Skin care regimen initiated : 03/17/2023 Topical wound management  initiated : 03/17/2023 Notes: Electronic Signature(s) Signed: 08/05/2023 2:53:36 PM By: Redmond Pulling RN, BSN Entered By: Redmond Pulling on 08/05/2023 05:53:23 -------------------------------------------------------------------------------- Pain Assessment Details Patient Name: Date of Service: [name] RD, [name] 08/05/2023 8:30 A M Medical Record Number: [id number] Patient Account Number: [id number] Date of Birth/Sex: Treating RN: 1950/11/04 (72 y.o. [name] Shantoya Geurts: [name] Other Clinician: Referring Linda Biehn: Treating Ressie Slevin/Extender: [name]: 22 Active Problems Location of Pain Severity and Description of Pain Patient Has Paino No Site Locations Pain Management and Medication Current Pain Management: Electronic Signature(s) Signed: 08/05/2023 2:53:36 PM By: Redmond Pulling RN, BSN Entered By: Redmond Pulling on 08/05/2023 05:52:39 Cybulski, [name] ([id number]) [id number] Page 6 of 8 -------------------------------------------------------------------------------- Patient/Caregiver Education Details Patient Name: Date of Service: [name] 10/9/2024andnbsp8:30 A M Medical Record Number: [id number] Patient Account Number: [id number] Date of Birth/Gender: Treating RN: April 15, 1951 (72 y.o. [name] Physician: [name] Other Clinician: Referring Physician: Treating Physician/Extender: [name] in Treatment: 22 Education Assessment Education Provided To: Patient Education Topics Provided Wound/Skin Impairment: Methods: Explain/Verbal Responses: State content correctly [employer]) Signed: 08/05/2023 2:53:36 PM By: Redmond Pulling RN, BSN Entered By: Redmond Pulling on 08/05/2023 05:53:40 -------------------------------------------------------------------------------- Wound Assessment Details Patient Name:  Date of Service: [name] RD, [name] 08/05/2023 8:30 A M Medical Record Number: [id number] Patient Account Number: [id number] Date of Birth/Sex: Treating RN: 08-Feb-1951 (72 y.o. [name] Sedra Morfin: [name] Other Clinician: Referring Melvin Marmo: Treating Shylin Keizer/Extender: [name]: 22 Wound Status Wound Number: 8 Primary Diabetic Wound/Ulcer of the Lower Extremity Etiology: Wound Location: Right T Great oe Wound Open Wounding Event: Gradually Appeared Status: Date Acquired: 02/04/2023 Comorbid Hypertension, Type II Diabetes, Osteoarthritis, Neuropathy, Weeks Of Treatment: 22 History: Received Radiation Clustered Wound: No Photos Wound Measurements Length: (cm) 0.6 Width: (cm) 0.6 Depth: (cm) 0.2 Area: (cm) 0.283 Volume: (cm) 0.057 % Reduction in Area: 97.5% % Reduction in Volume: 97.5% Epithelialization: Large (67-100%) Tunneling: No Undermining: No Wound Description [place], Peighton ([id number]) Classification: Grade 3 Wound Margin: Distinct, outline attached Exudate Amount: Medium Exudate Type: Serosanguineous Exudate Color: red, brown [id number] Page 7 of 8 Foul Odor After Cleansing: No Slough/Fibrino No Wound Bed Granulation Amount: Large (67-100%) Exposed Structure Granulation Quality: Red Fascia Exposed: No Necrotic Amount: None Present (0%) Fat Layer (Subcutaneous Tissue) Exposed: Yes Tendon Exposed: No Muscle Exposed: No Joint Exposed: No Bone Exposed: No Periwound Skin Texture Texture Color No Abnormalities Noted: No No Abnormalities Noted: Yes Callus: Yes Temperature / Pain Temperature: No Abnormality Moisture No Abnormalities Noted: No Dry / Scaly: Yes Maceration: No Treatment Notes Wound #8 (Toe Great) Wound Laterality: Right Cleanser Normal Saline Discharge Instruction: Cleanse the wound with Normal Saline prior to applying a clean dressing using  gauze sponges, not tissue or cotton balls. Soap and Water Discharge Instruction: May shower and wash wound with dial antibacterial soap and water prior to dressing change. Peri-Wound Care Topical Primary Dressing Promogran Prisma Matrix, 4.34 (sq in) (silver collagen) Discharge  Open N/A N/A Wound Status: No N/A N/A Wound Recurrence: 0.6x0.6x0.2 N/A N/A Measurements L x W x D (cm) 0.283 N/A N/A A (cm) : rea 0.057 N/A N/A Volume (cm) : 97.50% N/A N/A % Reduction in A rea: 97.50% N/A N/A % Reduction in Volume: Grade 3 N/A N/A Classification: Medium N/A N/A Exudate A mount: Serosanguineous N/A N/A Exudate Type: red, brown N/A N/A Exudate Color: Distinct, outline attached N/A N/A Wound Margin: Large (67-100%) N/A N/A Granulation A mount: Red N/A N/A Granulation Quality: None Present (0%) N/A N/A Necrotic A mount: Fat Layer (Subcutaneous Tissue): Yes N/A N/A Exposed Structures: Fascia: No Tendon: No Muscle: No Joint: No Bone: No Large (67-100%) N/A N/A Epithelialization: Debridement - Selective/Open Wound N/A N/A Debridement: Pre-procedure Verification/Time Out 09:20 N/A N/A Taken: Lidocaine 5% topical ointment N/A N/A Pain Control: Callus, Slough N/A N/A Tissue Debrided: Skin/Epidermis N/A N/A Level: 0.28 N/A N/A Debridement A (sq cm): rea Curette N/A N/A Instrument: Minimum N/A N/A Bleeding: Pressure N/A N/A Hemostasis A  chieved: Procedure was tolerated well N/A N/A Debridement Treatment Response: 0.6x0.6x0.2 N/A N/A Post Debridement Measurements L x W x D (cm) 0.057 N/A N/A Post Debridement Volume: (cm) Callus: Yes N/A N/A Periwound Skin Texture: Dry/Scaly: Yes N/A N/A Periwound Skin Moisture: Maceration: No No Abnormalities Noted N/A N/A Periwound Skin Color: No Abnormality N/A N/A Temperature: Debridement N/A N/A Procedures Performed: Treatment Notes Wound #8 (Toe Great) Wound Laterality: Right Cleanser Normal Saline Discharge Instruction: Cleanse the wound with Normal Saline prior to applying a clean dressing using gauze sponges, not tissue or cotton balls. Soap and Water Discharge Instruction: May shower and wash wound with dial antibacterial soap and water prior to dressing change. Peri-Wound Care Topical Primary Dressing Promogran Prisma Matrix, 4.34 (sq in) (silver collagen) Discharge Instruction: Moisten collagen with saline or hydrogel Secondary Dressing Optifoam Non-Adhesive Dressing, 4x4 in Discharge Instruction: Apply over primary dressing cut to make donut Woven Gauze Sponges 2x2 in [place], [name] ([id number]) [id number] Page 4 of 8 Discharge Instruction: Apply over primary dressing as directed. Secured With Conforming Stretch Gauze Bandage, Sterile 2x75 (in/in) Discharge Instruction: Secure with stretch gauze as directed. 55M Medipore H Soft Cloth Surgical T ape, 4 x 10 (in/yd) Discharge Instruction: Secure with tape as directed. Compression Wrap Compression Stockings Add-Ons Electronic Signature(s) Signed: 08/05/2023 10:01:41 AM By: [name] MD FACS Entered By: [name] on 08/05/2023 07:01:41 -------------------------------------------------------------------------------- Multi-Disciplinary Care Plan Details Patient Name: Date of Service: [name] RD, [name] 08/05/2023 8:30 A M Medical Record Number: [id number] Patient Account  Number: [id number] Date of Birth/Sex: Treating RN: 02/16/1951 (72 y.o. [name] Rhyan Radler: [name] Other Clinician: Referring Earma Nicolaou: Treating Roshunda Keir/Extender: [name] in Treatment: 22 Multidisciplinary Care Plan reviewed with physician Active Inactive Nutrition Nursing Diagnoses: Imbalanced nutrition Impaired glucose control: actual or potential Potential for alteratiion in Nutrition/Potential for imbalanced nutrition Goals: Patient/caregiver will maintain therapeutic glucose control Date Initiated: 03/17/2023 Target Resolution Date: 08/26/2023 Goal Status: Active Interventions: Assess HgA1c results as ordered upon admission and as needed Assess patient nutrition upon admission and as needed per policy Treatment Activities: Patient referred to Primary Care Physician for further nutritional evaluation : 03/17/2023 Notes: Wound/Skin Impairment Nursing Diagnoses: Impaired tissue integrity Knowledge deficit related to ulceration/compromised skin integrity Goals: Patient/caregiver will verbalize understanding of skin care regimen Date Initiated: 03/17/2023 Target Resolution Date: 08/26/2023 Goal Status: Active Ulcer/skin breakdown will have a volume reduction of 30% by week 4 Date Initiated: 03/17/2023 Date Inactivated: 04/02/2023 Target Resolution

## 2023-08-05 NOTE — Progress Notes (Signed)
healthy-looking granulation tissue. Wound measurements: 0.6x1.2x0.1 05/14/2023: The wound is smaller again today. There is callus accumulation around the edges with [name] little bit of slough on the surface. Measurements L x W x D (cm) 0.5x1.2x0.2 Area (cm): 0.471 Volume (cm) : 0.094 % Reduction in Area: 95.90% % Reduction in Volume: 95.90% 05/21/2023: He says that he was on his feet most of  Saturday and his foot got wet. There is tissue breakdown secondary to moisture on the plantar aspect of his toe. There is gray-green slough and callus accumulation. 05/28/2023: The wound looks better today. There is still some moisture related tissue breakdown that has progressed on the plantar aspect of his toe, but I think this is residual from last week, as there is no active tissue maceration. Measurements L x W x D (cm) 0.8x1x0. Area (cm): 0.628 Volume (cm) : 0.188 % Reduction in Area: 94.60% % Reduction in Volume: 91.90% 06/05/2023: The wound has improved considerably over the past week. There has been substantial epithelialization. There is some callus accumulation around the edges and light slough on the surface. 06/12/2023: The wound continues to improve. It is down to less than half [name] centimeter in greatest dimension and is completely flush with the surrounding tissue. There is some dry skin and callus accumulation around the edges with light slough on the surface. Measurements L x W x D (cm) 0.3x0.5x0.1 Area (cm): 0.118 Volume (cm) : 0.012 % Reduction in Area: 99.00% % Reduction in Volume: 99.50% 06/19/2023: The wound measured the same this week, but visually it appears smaller. There is some surrounding callus and dry skin but the surface looks robust and healthy. 06/26/2023: Unfortunately, there has been massive breakdown of his wound. Although the dressing did not have any drainage on it, there was extensive fluid and blood that collected underneath the periwound callus that has resulted in substantial deterioration. The bone remains covered with soft tissue, but the wound is markedly larger. There appears to be some pressure-induced tissue injury on the weightbearing aspect of the toe. 07/06/2023: The culture that I took at his last visit was positive for methicillin-resistant Staph aureus with tetracycline resistance. I prescribed [name] course of Bactrim and he is currently taking this.  His wound is markedly improved. No evidence of pressure-induced tissue injury. There is some callus accumulation around the wound edges and some slough on the surface. 07/13/2023: The wound continues to improve. There is [name] little bit of callus accumulation around the edges and the skin is starting to roll inward, but the surface is clean and the dimensions have decreased. 07/20/2023: The wound measured smaller again today. It is fairly superficial at this point. It is [name] little bit on the dry side. 07/29/2023: The wound is smaller again this week. There is [name] little bit of callus accumulation around the edges and very minimal slough on the surface. 08/05/2023: The wound continues to contract, albeit slowly. There is some callus and dry skin accumulation around the edges and some light slough on the surface. Electronic Signature(s) Signed: 08/05/2023 10:02:26 AM By: [name] MD FACS Entered By: [name] on 08/05/2023 07:02:26 [name], [name] ([id number]) [id number] Page 4 of 11 -------------------------------------------------------------------------------- Physical Exam Details Patient Name: Date of Service: [name] 08/05/2023 8:30 [name] M Medical Record Number: [id number] Patient Account Number: [id number] Date of Birth/Sex: Treating RN: 04/15/1951 (72 y.o. M) Primary Care Provider: [name] Other Clinician: Referring Provider: Treating Provider/Extender: [name]: 22 Constitutional Hypertensive, asymptomatic. . . .  hypertension 03/02/2023 No Yes N18.32 Chronic kidney disease, stage 3b 03/02/2023 No Yes Inactive Problems Resolved Problems ICD-10 Code Description Active Date Resolved Date L97.512 Non-pressure chronic ulcer of other part of right foot with fat layer exposed 03/09/2023 03/09/2023 Electronic Signature(s) Signed: 08/05/2023 10:01:33 AM By: [name] MD FACS Entered By: [name] on 08/05/2023 07:01:33 -------------------------------------------------------------------------------- Progress Note Details Patient Name: Date of Service: [name] [name], [name] 08/05/2023 8:30 [name] M Medical Record Number: [id number] Patient Account Number: [id number] Date of Birth/Sex: Treating RN: 11-01-1950 (72 y.o. M) Primary Care Provider: [name] Other Clinician: Referring Provider: Treating Provider/Extender: [name] in Treatment: 22 Subjective Chief Complaint Information obtained from Patient Bilateral T Ulcers 03/02/2023: right great toe ulcer with osteomyelitis oe History of Present Illness (HPI) 09/26/2020 on evaluation today patient appears to be doing somewhat poorly in regard to his bilateral feet at this point due to issues that he is been having that developed about 2 weeks ago. He was walking home which was about [name] mile and states that he first noted the areas on the right foot beginning. These were blisters that develop. The left started Thanksgiving day when he was up standing more cooking. He did see his primary care provider who referred him to podiatry. He saw Dr. Allena Katz and Dr. Allena Katz recommended Betadine moistened gauze dressings to the wounds and to follow-up in 2 weeks. With that being said the patient subsequently was concerned about infection 2 to 3 days later as was his family member who is with him today. I believe this was [name] sister. With that being said subsequently he ended up going to urgent care at that point he was given Diflucan and  Keflex he is done with both at this time. He is could be canceling the appointment with Dr. Allena Katz they tell me at this point. His most recent hemoglobin A1c was 6.6 on November 12. His ABIs were 1.2 on the right and 1.03 on the left and appear to be doing excellent. With that being said he does not have diabetic shoes and I feel like the shoes were likely the culprit for what led to this issue currently. There does not appear to be any signs of systemic infection nor local infection at this point which is great news. The patient does have [name] history of hypertension as well as potentially [name] recurrence of prostate cancer he is being followed by the cancer center for that at this point 10/03/2020 upon evaluation today patient appears to be doing better in regard to his toe ulcers. He has been tolerating the dressing changes without complication. Fortunately there is no signs of active infection at this time. The right great toe and fifth toe are the 2 worst out of everything here he has [name] couple that have healed on the left foot there is really nothing remaining open though he has several areas of callus on the left foot in fact 3 on the third fourth and fifth toes. That is going need to be removed today as well. 10/10/2020 upon evaluation today patient appears to be doing well in regard to his wounds. He still has wounds open on the first, second, and fifth toes of the right foot. Left foot is completely healed and the right fourth toe is also completely healed today. Overall very pleased with how things seem to be progressing. 10/17/2020 on evaluation today patient appears to be doing much better in regard to the right first, second, and fifth toes.  acute osteomyelitis, right ankle and foot Type 2 diabetes mellitus with foot ulcer Essential (primary) hypertension Chronic kidney disease, stage 3b Procedures Wound #8 Pre-procedure diagnosis of Wound #8 is [name] Diabetic Wound/Ulcer of the Lower Extremity located on the Right T Great .Severity of Tissue Pre Debridement is: oe Fat layer exposed. There was [name] Selective/Open Wound Skin/Epidermis Debridement with [name] total area of 0.28 sq cm performed by [name], MD. With the following instrument(s): Curette to remove Non-Viable tissue/material. Material removed includes Callus, Slough, Skin: Dermis, and Skin: Epidermis after achieving pain control using Lidocaine 5% topical ointment. No specimens were taken. [name] time out was conducted at 09:20, prior to the  start of the procedure. [name] Minimum amount of bleeding was controlled with Pressure. The procedure was tolerated well. Post Debridement Measurements: 0.6cm length x 0.6cm width x 0.2cm depth; 0.057cm^3 volume. Character of Wound/Ulcer Post Debridement is improved. Severity of Tissue Post Debridement is: Fat layer exposed. Post procedure Diagnosis Wound #8: Same as Pre-Procedure Plan Follow-up Appointments: Return Appointment in 2 weeks. - Dr Lady Gary Rm 8 Wednesday 08/19/23 @ 0930 Anesthetic: (In clinic) Topical Lidocaine 4% applied to wound bed Bathing/ Shower/ Hygiene: May shower and wash wound with soap and water. Edema Control - Lymphedema / SCD / Other: Avoid standing for long periods of time. Exercise regularly Moisturize legs daily. Off-Loading: Wound #8 Right T Great: oe Other: - Wear the [employer] front offloading sandal when walking, minimal weight bearing right foot The following medication(s) was prescribed: lidocaine topical 5 % ointment ointment topical once daily was prescribed at facility WOUND #8: - T Great Wound Laterality: Right oe Cleanser: Normal Saline (Generic) 1 x Per Day/30 Days Discharge Instructions: Cleanse the wound with Normal Saline prior to applying [name] clean dressing using gauze sponges, not tissue or cotton balls. Cleanser: Soap and Water 1 x Per Day/30 Days Discharge Instructions: May shower and wash wound with dial antibacterial soap and water prior to dressing change. Prim Dressing: Promogran Prisma Matrix, 4.34 (sq in) (silver collagen) 1 x Per Day/30 Days ary Discharge Instructions: Moisten collagen with saline or hydrogel Secondary Dressing: Optifoam Non-Adhesive Dressing, 4x4 in 1 x Per Day/30 Days Discharge Instructions: Apply over primary dressing cut to make donut Secondary Dressing: Woven Gauze Sponges 2x2 in (Generic) 1 x Per Day/30 Days Discharge Instructions: Apply over primary dressing as directed. Secured With: Administrator, sports, Sterile 2x75 (in/in) (Generic) 1 x Per Day/30 Days Discharge Instructions: Secure with stretch gauze as directed. Secured With: 38M Medipore H Soft Cloth Surgical T ape, 4 x 10 (in/yd) (Generic) 1 x Per Day/30 Days Discharge Instructions: Secure with tape as directed. 08/05/2023: The wound continues to contract, albeit slowly. There is some callus and dry skin accumulation around the edges and some light slough on the surface. I used [name] curette to debride callus, slough, and skin from the wound. We will continue Prisma silver collagen. He will continue to offload the wound. Follow-up in 1 week. Electronic Signature(s) Signed: 08/05/2023 10:12:59 AM By: [name] MD FACS Previous Signature: 08/05/2023 10:03:30 AM Version By: [name] MD FACS Entered By: [name] on 08/05/2023 07:12:59 [name], [name] ([id number]) [id number] Page 10 of 11 -------------------------------------------------------------------------------- HxROS Details Patient Name: Date of Service: [name] 08/05/2023 8:30 [name] M Medical Record Number: [id number] Patient Account Number: [id number] Date of Birth/Sex: Treating RN: 08/05/1951 (73 y.o. M) Primary Care Provider: [name] Other Clinician: Referring Provider: Treating Provider/Extender: [name]: 22 Information  Obtained From Patient Eyes Medical History: Past Medical History Notes: Diabetic retinopathy Cardiovascular Medical History: Positive for: Hypertension Past Medical History Notes: Carotid artery stenosis Endocrine Medical History: Positive for: Type II Diabetes Negative for: Type I Diabetes Time with diabetes: 5 years Treated with: Insulin Blood sugar tested every day: Yes Tested : twice [name] day Musculoskeletal Medical History: Positive for: Osteoarthritis Neurologic Medical History: Positive for: Neuropathy Oncologic Medical  History: Positive for: Received Radiation Past Medical History Notes: Prostate cancer Immunizations Pneumococcal Vaccine: Received Pneumococcal Vaccination: No Implantable Devices None Family and Social History Cancer: No; Diabetes: Yes - Mother,Siblings; Heart Disease: No; Hereditary Spherocytosis: No; Hypertension: No; Kidney Disease: No; Lung Disease: No; Seizures: No; Stroke: No; Thyroid Problems: No; Tuberculosis: No; Never smoker; Marital Status - Single; Alcohol Use: Never; Drug Use: No History; Caffeine Use: Rarely; Financial Concerns: No; Food, Clothing or Shelter Needs: No; Support System Lacking: No; Transportation Concerns: No Electronic Signature(s) Signed: 08/05/2023 12:20:15 PM By: [name] MD FACS Entered By: [name] on 08/05/2023 07:02:33 [name], [name] ([id number]) [id number] Page 11 of 11 -------------------------------------------------------------------------------- SuperBill Details Patient Name: Date of Service: [name] 08/05/2023 Medical Record Number: [id number] Patient Account Number: [id number] Date of Birth/Sex: Treating RN: 1951-05-23 (72 y.o. M) Primary Care Provider: [name] Other Clinician: Referring Provider: Treating Provider/Extender: [name]: 22 Diagnosis Coding ICD-10 Codes Code Description [phone] Non-pressure chronic ulcer of other part of right foot with necrosis of bone M86.171 Other acute osteomyelitis, right ankle and foot E11.621 Type 2 diabetes mellitus with foot ulcer I10 Essential (primary) hypertension N18.32 Chronic kidney disease, stage 3b Facility Procedures : CPT4 Code: [id number] Description: 97597 - DEBRIDE WOUND 1ST 20 SQ CM OR < ICD-10 Diagnosis Description L97.514 Non-pressure chronic ulcer of other part of right foot with necrosis of bone Modifier: Quantity: 1 Physician Procedures : CPT4 Code Description Modifier  [id number] 99213 - WC PHYS LEVEL 3 - EST PT 25 ICD-10 Diagnosis Description L97.514 Non-pressure chronic ulcer of other part of right foot with necrosis of bone M86.171 Other acute osteomyelitis, right ankle and foot E11.621  Type 2 diabetes mellitus with foot ulcer N18.32 Chronic kidney disease, stage 3b Quantity: 1 : [id number] 97597 - WC PHYS DEBR WO ANESTH 20 SQ CM ICD-10 Diagnosis Description L97.514 Non-pressure chronic ulcer of other part of right foot with necrosis of bone Quantity: 1 Electronic Signature(s) Signed: 08/05/2023 10:13:16 AM By: [name] MD FACS Entered By: [name] on 08/05/2023 07:13:15  healthy-looking granulation tissue. Wound measurements: 0.6x1.2x0.1 05/14/2023: The wound is smaller again today. There is callus accumulation around the edges with [name] little bit of slough on the surface. Measurements L x W x D (cm) 0.5x1.2x0.2 Area (cm): 0.471 Volume (cm) : 0.094 % Reduction in Area: 95.90% % Reduction in Volume: 95.90% 05/21/2023: He says that he was on his feet most of  Saturday and his foot got wet. There is tissue breakdown secondary to moisture on the plantar aspect of his toe. There is gray-green slough and callus accumulation. 05/28/2023: The wound looks better today. There is still some moisture related tissue breakdown that has progressed on the plantar aspect of his toe, but I think this is residual from last week, as there is no active tissue maceration. Measurements L x W x D (cm) 0.8x1x0. Area (cm): 0.628 Volume (cm) : 0.188 % Reduction in Area: 94.60% % Reduction in Volume: 91.90% 06/05/2023: The wound has improved considerably over the past week. There has been substantial epithelialization. There is some callus accumulation around the edges and light slough on the surface. 06/12/2023: The wound continues to improve. It is down to less than half [name] centimeter in greatest dimension and is completely flush with the surrounding tissue. There is some dry skin and callus accumulation around the edges with light slough on the surface. Measurements L x W x D (cm) 0.3x0.5x0.1 Area (cm): 0.118 Volume (cm) : 0.012 % Reduction in Area: 99.00% % Reduction in Volume: 99.50% 06/19/2023: The wound measured the same this week, but visually it appears smaller. There is some surrounding callus and dry skin but the surface looks robust and healthy. 06/26/2023: Unfortunately, there has been massive breakdown of his wound. Although the dressing did not have any drainage on it, there was extensive fluid and blood that collected underneath the periwound callus that has resulted in substantial deterioration. The bone remains covered with soft tissue, but the wound is markedly larger. There appears to be some pressure-induced tissue injury on the weightbearing aspect of the toe. 07/06/2023: The culture that I took at his last visit was positive for methicillin-resistant Staph aureus with tetracycline resistance. I prescribed [name] course of Bactrim and he is currently taking this.  His wound is markedly improved. No evidence of pressure-induced tissue injury. There is some callus accumulation around the wound edges and some slough on the surface. 07/13/2023: The wound continues to improve. There is [name] little bit of callus accumulation around the edges and the skin is starting to roll inward, but the surface is clean and the dimensions have decreased. 07/20/2023: The wound measured smaller again today. It is fairly superficial at this point. It is [name] little bit on the dry side. 07/29/2023: The wound is smaller again this week. There is [name] little bit of callus accumulation around the edges and very minimal slough on the surface. 08/05/2023: The wound continues to contract, albeit slowly. There is some callus and dry skin accumulation around the edges and some light slough on the surface. Electronic Signature(s) Signed: 08/05/2023 10:02:26 AM By: [name] MD FACS Entered By: [name] on 08/05/2023 07:02:26 [name], [name] ([id number]) [id number] Page 4 of 11 -------------------------------------------------------------------------------- Physical Exam Details Patient Name: Date of Service: [name] 08/05/2023 8:30 [name] M Medical Record Number: [id number] Patient Account Number: [id number] Date of Birth/Sex: Treating RN: 04/15/1951 (72 y.o. M) Primary Care Provider: [name] Other Clinician: Referring Provider: Treating Provider/Extender: [name]: 22 Constitutional Hypertensive, asymptomatic. . . .  healthy-looking granulation tissue. Wound measurements: 0.6x1.2x0.1 05/14/2023: The wound is smaller again today. There is callus accumulation around the edges with [name] little bit of slough on the surface. Measurements L x W x D (cm) 0.5x1.2x0.2 Area (cm): 0.471 Volume (cm) : 0.094 % Reduction in Area: 95.90% % Reduction in Volume: 95.90% 05/21/2023: He says that he was on his feet most of  Saturday and his foot got wet. There is tissue breakdown secondary to moisture on the plantar aspect of his toe. There is gray-green slough and callus accumulation. 05/28/2023: The wound looks better today. There is still some moisture related tissue breakdown that has progressed on the plantar aspect of his toe, but I think this is residual from last week, as there is no active tissue maceration. Measurements L x W x D (cm) 0.8x1x0. Area (cm): 0.628 Volume (cm) : 0.188 % Reduction in Area: 94.60% % Reduction in Volume: 91.90% 06/05/2023: The wound has improved considerably over the past week. There has been substantial epithelialization. There is some callus accumulation around the edges and light slough on the surface. 06/12/2023: The wound continues to improve. It is down to less than half [name] centimeter in greatest dimension and is completely flush with the surrounding tissue. There is some dry skin and callus accumulation around the edges with light slough on the surface. Measurements L x W x D (cm) 0.3x0.5x0.1 Area (cm): 0.118 Volume (cm) : 0.012 % Reduction in Area: 99.00% % Reduction in Volume: 99.50% 06/19/2023: The wound measured the same this week, but visually it appears smaller. There is some surrounding callus and dry skin but the surface looks robust and healthy. 06/26/2023: Unfortunately, there has been massive breakdown of his wound. Although the dressing did not have any drainage on it, there was extensive fluid and blood that collected underneath the periwound callus that has resulted in substantial deterioration. The bone remains covered with soft tissue, but the wound is markedly larger. There appears to be some pressure-induced tissue injury on the weightbearing aspect of the toe. 07/06/2023: The culture that I took at his last visit was positive for methicillin-resistant Staph aureus with tetracycline resistance. I prescribed [name] course of Bactrim and he is currently taking this.  His wound is markedly improved. No evidence of pressure-induced tissue injury. There is some callus accumulation around the wound edges and some slough on the surface. 07/13/2023: The wound continues to improve. There is [name] little bit of callus accumulation around the edges and the skin is starting to roll inward, but the surface is clean and the dimensions have decreased. 07/20/2023: The wound measured smaller again today. It is fairly superficial at this point. It is [name] little bit on the dry side. 07/29/2023: The wound is smaller again this week. There is [name] little bit of callus accumulation around the edges and very minimal slough on the surface. 08/05/2023: The wound continues to contract, albeit slowly. There is some callus and dry skin accumulation around the edges and some light slough on the surface. Electronic Signature(s) Signed: 08/05/2023 10:02:26 AM By: [name] MD FACS Entered By: [name] on 08/05/2023 07:02:26 [name], [name] ([id number]) [id number] Page 4 of 11 -------------------------------------------------------------------------------- Physical Exam Details Patient Name: Date of Service: [name] 08/05/2023 8:30 [name] M Medical Record Number: [id number] Patient Account Number: [id number] Date of Birth/Sex: Treating RN: 04/15/1951 (72 y.o. M) Primary Care Provider: [name] Other Clinician: Referring Provider: Treating Provider/Extender: [name]: 22 Constitutional Hypertensive, asymptomatic. . . .  hypertension 03/02/2023 No Yes N18.32 Chronic kidney disease, stage 3b 03/02/2023 No Yes Inactive Problems Resolved Problems ICD-10 Code Description Active Date Resolved Date L97.512 Non-pressure chronic ulcer of other part of right foot with fat layer exposed 03/09/2023 03/09/2023 Electronic Signature(s) Signed: 08/05/2023 10:01:33 AM By: [name] MD FACS Entered By: [name] on 08/05/2023 07:01:33 -------------------------------------------------------------------------------- Progress Note Details Patient Name: Date of Service: [name] [name], [name] 08/05/2023 8:30 [name] M Medical Record Number: [id number] Patient Account Number: [id number] Date of Birth/Sex: Treating RN: 11-01-1950 (72 y.o. M) Primary Care Provider: [name] Other Clinician: Referring Provider: Treating Provider/Extender: [name] in Treatment: 22 Subjective Chief Complaint Information obtained from Patient Bilateral T Ulcers 03/02/2023: right great toe ulcer with osteomyelitis oe History of Present Illness (HPI) 09/26/2020 on evaluation today patient appears to be doing somewhat poorly in regard to his bilateral feet at this point due to issues that he is been having that developed about 2 weeks ago. He was walking home which was about [name] mile and states that he first noted the areas on the right foot beginning. These were blisters that develop. The left started Thanksgiving day when he was up standing more cooking. He did see his primary care provider who referred him to podiatry. He saw Dr. Allena Katz and Dr. Allena Katz recommended Betadine moistened gauze dressings to the wounds and to follow-up in 2 weeks. With that being said the patient subsequently was concerned about infection 2 to 3 days later as was his family member who is with him today. I believe this was [name] sister. With that being said subsequently he ended up going to urgent care at that point he was given Diflucan and  Keflex he is done with both at this time. He is could be canceling the appointment with Dr. Allena Katz they tell me at this point. His most recent hemoglobin A1c was 6.6 on November 12. His ABIs were 1.2 on the right and 1.03 on the left and appear to be doing excellent. With that being said he does not have diabetic shoes and I feel like the shoes were likely the culprit for what led to this issue currently. There does not appear to be any signs of systemic infection nor local infection at this point which is great news. The patient does have [name] history of hypertension as well as potentially [name] recurrence of prostate cancer he is being followed by the cancer center for that at this point 10/03/2020 upon evaluation today patient appears to be doing better in regard to his toe ulcers. He has been tolerating the dressing changes without complication. Fortunately there is no signs of active infection at this time. The right great toe and fifth toe are the 2 worst out of everything here he has [name] couple that have healed on the left foot there is really nothing remaining open though he has several areas of callus on the left foot in fact 3 on the third fourth and fifth toes. That is going need to be removed today as well. 10/10/2020 upon evaluation today patient appears to be doing well in regard to his wounds. He still has wounds open on the first, second, and fifth toes of the right foot. Left foot is completely healed and the right fourth toe is also completely healed today. Overall very pleased with how things seem to be progressing. 10/17/2020 on evaluation today patient appears to be doing much better in regard to the right first, second, and fifth toes.  healthy-looking granulation tissue. Wound measurements: 0.6x1.2x0.1 05/14/2023: The wound is smaller again today. There is callus accumulation around the edges with [name] little bit of slough on the surface. Measurements L x W x D (cm) 0.5x1.2x0.2 Area (cm): 0.471 Volume (cm) : 0.094 % Reduction in Area: 95.90% % Reduction in Volume: 95.90% 05/21/2023: He says that he was on his feet most of  Saturday and his foot got wet. There is tissue breakdown secondary to moisture on the plantar aspect of his toe. There is gray-green slough and callus accumulation. 05/28/2023: The wound looks better today. There is still some moisture related tissue breakdown that has progressed on the plantar aspect of his toe, but I think this is residual from last week, as there is no active tissue maceration. Measurements L x W x D (cm) 0.8x1x0. Area (cm): 0.628 Volume (cm) : 0.188 % Reduction in Area: 94.60% % Reduction in Volume: 91.90% 06/05/2023: The wound has improved considerably over the past week. There has been substantial epithelialization. There is some callus accumulation around the edges and light slough on the surface. 06/12/2023: The wound continues to improve. It is down to less than half [name] centimeter in greatest dimension and is completely flush with the surrounding tissue. There is some dry skin and callus accumulation around the edges with light slough on the surface. Measurements L x W x D (cm) 0.3x0.5x0.1 Area (cm): 0.118 Volume (cm) : 0.012 % Reduction in Area: 99.00% % Reduction in Volume: 99.50% 06/19/2023: The wound measured the same this week, but visually it appears smaller. There is some surrounding callus and dry skin but the surface looks robust and healthy. 06/26/2023: Unfortunately, there has been massive breakdown of his wound. Although the dressing did not have any drainage on it, there was extensive fluid and blood that collected underneath the periwound callus that has resulted in substantial deterioration. The bone remains covered with soft tissue, but the wound is markedly larger. There appears to be some pressure-induced tissue injury on the weightbearing aspect of the toe. 07/06/2023: The culture that I took at his last visit was positive for methicillin-resistant Staph aureus with tetracycline resistance. I prescribed [name] course of Bactrim and he is currently taking this.  His wound is markedly improved. No evidence of pressure-induced tissue injury. There is some callus accumulation around the wound edges and some slough on the surface. 07/13/2023: The wound continues to improve. There is [name] little bit of callus accumulation around the edges and the skin is starting to roll inward, but the surface is clean and the dimensions have decreased. 07/20/2023: The wound measured smaller again today. It is fairly superficial at this point. It is [name] little bit on the dry side. 07/29/2023: The wound is smaller again this week. There is [name] little bit of callus accumulation around the edges and very minimal slough on the surface. 08/05/2023: The wound continues to contract, albeit slowly. There is some callus and dry skin accumulation around the edges and some light slough on the surface. Electronic Signature(s) Signed: 08/05/2023 10:02:26 AM By: [name] MD FACS Entered By: [name] on 08/05/2023 07:02:26 [name], [name] ([id number]) [id number] Page 4 of 11 -------------------------------------------------------------------------------- Physical Exam Details Patient Name: Date of Service: [name] 08/05/2023 8:30 [name] M Medical Record Number: [id number] Patient Account Number: [id number] Date of Birth/Sex: Treating RN: 04/15/1951 (72 y.o. M) Primary Care Provider: [name] Other Clinician: Referring Provider: Treating Provider/Extender: [name]: 22 Constitutional Hypertensive, asymptomatic. . . .  hypertension 03/02/2023 No Yes N18.32 Chronic kidney disease, stage 3b 03/02/2023 No Yes Inactive Problems Resolved Problems ICD-10 Code Description Active Date Resolved Date L97.512 Non-pressure chronic ulcer of other part of right foot with fat layer exposed 03/09/2023 03/09/2023 Electronic Signature(s) Signed: 08/05/2023 10:01:33 AM By: [name] MD FACS Entered By: [name] on 08/05/2023 07:01:33 -------------------------------------------------------------------------------- Progress Note Details Patient Name: Date of Service: [name] [name], [name] 08/05/2023 8:30 [name] M Medical Record Number: [id number] Patient Account Number: [id number] Date of Birth/Sex: Treating RN: 11-01-1950 (72 y.o. M) Primary Care Provider: [name] Other Clinician: Referring Provider: Treating Provider/Extender: [name] in Treatment: 22 Subjective Chief Complaint Information obtained from Patient Bilateral T Ulcers 03/02/2023: right great toe ulcer with osteomyelitis oe History of Present Illness (HPI) 09/26/2020 on evaluation today patient appears to be doing somewhat poorly in regard to his bilateral feet at this point due to issues that he is been having that developed about 2 weeks ago. He was walking home which was about [name] mile and states that he first noted the areas on the right foot beginning. These were blisters that develop. The left started Thanksgiving day when he was up standing more cooking. He did see his primary care provider who referred him to podiatry. He saw Dr. Allena Katz and Dr. Allena Katz recommended Betadine moistened gauze dressings to the wounds and to follow-up in 2 weeks. With that being said the patient subsequently was concerned about infection 2 to 3 days later as was his family member who is with him today. I believe this was [name] sister. With that being said subsequently he ended up going to urgent care at that point he was given Diflucan and  Keflex he is done with both at this time. He is could be canceling the appointment with Dr. Allena Katz they tell me at this point. His most recent hemoglobin A1c was 6.6 on November 12. His ABIs were 1.2 on the right and 1.03 on the left and appear to be doing excellent. With that being said he does not have diabetic shoes and I feel like the shoes were likely the culprit for what led to this issue currently. There does not appear to be any signs of systemic infection nor local infection at this point which is great news. The patient does have [name] history of hypertension as well as potentially [name] recurrence of prostate cancer he is being followed by the cancer center for that at this point 10/03/2020 upon evaluation today patient appears to be doing better in regard to his toe ulcers. He has been tolerating the dressing changes without complication. Fortunately there is no signs of active infection at this time. The right great toe and fifth toe are the 2 worst out of everything here he has [name] couple that have healed on the left foot there is really nothing remaining open though he has several areas of callus on the left foot in fact 3 on the third fourth and fifth toes. That is going need to be removed today as well. 10/10/2020 upon evaluation today patient appears to be doing well in regard to his wounds. He still has wounds open on the first, second, and fifth toes of the right foot. Left foot is completely healed and the right fourth toe is also completely healed today. Overall very pleased with how things seem to be progressing. 10/17/2020 on evaluation today patient appears to be doing much better in regard to the right first, second, and fifth toes.  acute osteomyelitis, right ankle and foot Type 2 diabetes mellitus with foot ulcer Essential (primary) hypertension Chronic kidney disease, stage 3b Procedures Wound #8 Pre-procedure diagnosis of Wound #8 is [name] Diabetic Wound/Ulcer of the Lower Extremity located on the Right T Great .Severity of Tissue Pre Debridement is: oe Fat layer exposed. There was [name] Selective/Open Wound Skin/Epidermis Debridement with [name] total area of 0.28 sq cm performed by [name], MD. With the following instrument(s): Curette to remove Non-Viable tissue/material. Material removed includes Callus, Slough, Skin: Dermis, and Skin: Epidermis after achieving pain control using Lidocaine 5% topical ointment. No specimens were taken. [name] time out was conducted at 09:20, prior to the  start of the procedure. [name] Minimum amount of bleeding was controlled with Pressure. The procedure was tolerated well. Post Debridement Measurements: 0.6cm length x 0.6cm width x 0.2cm depth; 0.057cm^3 volume. Character of Wound/Ulcer Post Debridement is improved. Severity of Tissue Post Debridement is: Fat layer exposed. Post procedure Diagnosis Wound #8: Same as Pre-Procedure Plan Follow-up Appointments: Return Appointment in 2 weeks. - Dr Lady Gary Rm 8 Wednesday 08/19/23 @ 0930 Anesthetic: (In clinic) Topical Lidocaine 4% applied to wound bed Bathing/ Shower/ Hygiene: May shower and wash wound with soap and water. Edema Control - Lymphedema / SCD / Other: Avoid standing for long periods of time. Exercise regularly Moisturize legs daily. Off-Loading: Wound #8 Right T Great: oe Other: - Wear the [employer] front offloading sandal when walking, minimal weight bearing right foot The following medication(s) was prescribed: lidocaine topical 5 % ointment ointment topical once daily was prescribed at facility WOUND #8: - T Great Wound Laterality: Right oe Cleanser: Normal Saline (Generic) 1 x Per Day/30 Days Discharge Instructions: Cleanse the wound with Normal Saline prior to applying [name] clean dressing using gauze sponges, not tissue or cotton balls. Cleanser: Soap and Water 1 x Per Day/30 Days Discharge Instructions: May shower and wash wound with dial antibacterial soap and water prior to dressing change. Prim Dressing: Promogran Prisma Matrix, 4.34 (sq in) (silver collagen) 1 x Per Day/30 Days ary Discharge Instructions: Moisten collagen with saline or hydrogel Secondary Dressing: Optifoam Non-Adhesive Dressing, 4x4 in 1 x Per Day/30 Days Discharge Instructions: Apply over primary dressing cut to make donut Secondary Dressing: Woven Gauze Sponges 2x2 in (Generic) 1 x Per Day/30 Days Discharge Instructions: Apply over primary dressing as directed. Secured With: Administrator, sports, Sterile 2x75 (in/in) (Generic) 1 x Per Day/30 Days Discharge Instructions: Secure with stretch gauze as directed. Secured With: 38M Medipore H Soft Cloth Surgical T ape, 4 x 10 (in/yd) (Generic) 1 x Per Day/30 Days Discharge Instructions: Secure with tape as directed. 08/05/2023: The wound continues to contract, albeit slowly. There is some callus and dry skin accumulation around the edges and some light slough on the surface. I used [name] curette to debride callus, slough, and skin from the wound. We will continue Prisma silver collagen. He will continue to offload the wound. Follow-up in 1 week. Electronic Signature(s) Signed: 08/05/2023 10:12:59 AM By: [name] MD FACS Previous Signature: 08/05/2023 10:03:30 AM Version By: [name] MD FACS Entered By: [name] on 08/05/2023 07:12:59 [name], [name] ([id number]) [id number] Page 10 of 11 -------------------------------------------------------------------------------- HxROS Details Patient Name: Date of Service: [name] 08/05/2023 8:30 [name] M Medical Record Number: [id number] Patient Account Number: [id number] Date of Birth/Sex: Treating RN: 08/05/1951 (73 y.o. M) Primary Care Provider: [name] Other Clinician: Referring Provider: Treating Provider/Extender: [name]: 22 Information  [name], [name] ([id number]) [id number] Page 1 of 11 Visit Report for 08/05/2023 Chief Complaint Document Details Patient Name: Date of Service: [name] 08/05/2023 8:30 [name] M Medical Record Number: [id number] Patient Account Number: [id number] Date of Birth/Sex: Treating RN: 05-Jul-1951 (72 y.o. M) Primary Care Provider: [name] Other Clinician: Referring Provider: Treating Provider/Extender: [name] in Treatment: 22 Information Obtained from: Patient Chief Complaint Bilateral T Ulcers oe 03/02/2023: right great toe ulcer with osteomyelitis Electronic Signature(s) Signed: 08/05/2023 10:01:50 AM By: [name] MD FACS Entered By: [name] on 08/05/2023 07:01:50 -------------------------------------------------------------------------------- Debridement Details Patient Name: Date of Service: [name] [name], [name] 08/05/2023 8:30 [name] M Medical Record Number: [id number] Patient Account Number: [id number] Date of Birth/Sex: Treating RN: 12-27-1950 (72 y.o. [name] Provider: [name] Other Clinician: Referring Provider: Treating Provider/Extender: [name] in Treatment: 22 Debridement Performed for Assessment: Wound #8 Right T Great oe Performed By: Physician [name], MD The following information was scribed by: Redmond Pulling The information was scribed for: [name] Debridement Type: Debridement Severity of Tissue Pre Debridement: Fat layer exposed Level of Consciousness (Pre-procedure): Awake and Alert Pre-procedure Verification/Time Out Yes - 09:20 Taken: Start Time: 09:24 Pain Control: Lidocaine 5% topical ointment Percent of Wound Bed Debrided: 100% T Area Debrided (cm): otal 0.28 Tissue and other material debrided: Non-Viable, Callus, Slough, Skin: Dermis , Skin: Epidermis, Slough Level: Skin/Epidermis Debridement  Description: Selective/Open Wound Instrument: Curette Bleeding: Minimum Hemostasis Achieved: Pressure Response to Treatment: Procedure was tolerated well Level of Consciousness (Post- Awake and Alert procedure): Post Debridement Measurements of Total Wound Length: (cm) 0.6 Width: (cm) 0.6 Depth: (cm) 0.2 Volume: (cm) 0.057 [name], [name] ([id number]) [id number] Page 2 of 11 Character of Wound/Ulcer Post Debridement: Improved Severity of Tissue Post Debridement: Fat layer exposed Post Procedure Diagnosis Same as Pre-procedure Electronic Signature(s) Signed: 08/05/2023 12:20:15 PM By: [name] MD FACS Signed: 08/05/2023 2:53:36 PM By: Redmond Pulling RN, BSN Entered By: Redmond Pulling on 08/05/2023 06:26:39 -------------------------------------------------------------------------------- HPI Details Patient Name: Date of Service: [name], [name] 08/05/2023 8:30 [name] M Medical Record Number: [id number] Patient Account Number: [id number] Date of Birth/Sex: Treating RN: 04-Nov-1950 (72 y.o. M) Primary Care Provider: [name] Other Clinician: Referring Provider: Treating Provider/Extender: [name] in Treatment: 22 History of Present Illness HPI Description: 09/26/2020 on evaluation today patient appears to be doing somewhat poorly in regard to his bilateral feet at this point due to issues that he is been having that developed about 2 weeks ago. He was walking home which was about [name] mile and states that he first noted the areas on the right foot beginning. These were blisters that develop. The left started Thanksgiving day when he was up standing more cooking. He did see his primary care provider who referred him to podiatry. He saw Dr. Allena Katz and Dr. Allena Katz recommended Betadine moistened gauze dressings to the wounds and to follow-up in 2 weeks. With that being said the patient subsequently was concerned about infection 2  to 3 days later as was his family member who is with him today. I believe this was [name] sister. With that being said subsequently he ended up going to urgent care at that point he was given Diflucan and Keflex he is done with both at this time. He is could be canceling the appointment with Dr. Allena Katz they tell me at this point. His most recent hemoglobin A1c was 6.6 on November 12. His

## 2023-08-14 DIAGNOSIS — K6289 Other specified diseases of anus and rectum: Secondary | ICD-10-CM | POA: Diagnosis not present

## 2023-08-14 DIAGNOSIS — D123 Benign neoplasm of transverse colon: Secondary | ICD-10-CM | POA: Diagnosis not present

## 2023-08-14 DIAGNOSIS — Z800 Family history of malignant neoplasm of digestive organs: Secondary | ICD-10-CM | POA: Diagnosis not present

## 2023-08-14 DIAGNOSIS — Z1211 Encounter for screening for malignant neoplasm of colon: Secondary | ICD-10-CM | POA: Diagnosis not present

## 2023-08-14 DIAGNOSIS — D128 Benign neoplasm of rectum: Secondary | ICD-10-CM | POA: Diagnosis not present

## 2023-08-14 DIAGNOSIS — D124 Benign neoplasm of descending colon: Secondary | ICD-10-CM | POA: Diagnosis not present

## 2023-08-14 DIAGNOSIS — K621 Rectal polyp: Secondary | ICD-10-CM | POA: Diagnosis not present

## 2023-08-14 LAB — HM COLONOSCOPY

## 2023-08-18 DIAGNOSIS — K621 Rectal polyp: Secondary | ICD-10-CM | POA: Diagnosis not present

## 2023-08-18 DIAGNOSIS — D124 Benign neoplasm of descending colon: Secondary | ICD-10-CM | POA: Diagnosis not present

## 2023-08-18 DIAGNOSIS — D123 Benign neoplasm of transverse colon: Secondary | ICD-10-CM | POA: Diagnosis not present

## 2023-08-19 ENCOUNTER — Encounter (HOSPITAL_BASED_OUTPATIENT_CLINIC_OR_DEPARTMENT_OTHER): Payer: Medicare HMO | Admitting: General Surgery

## 2023-08-19 DIAGNOSIS — I129 Hypertensive chronic kidney disease with stage 1 through stage 4 chronic kidney disease, or unspecified chronic kidney disease: Secondary | ICD-10-CM | POA: Diagnosis not present

## 2023-08-19 DIAGNOSIS — E1122 Type 2 diabetes mellitus with diabetic chronic kidney disease: Secondary | ICD-10-CM | POA: Diagnosis not present

## 2023-08-19 DIAGNOSIS — M86171 Other acute osteomyelitis, right ankle and foot: Secondary | ICD-10-CM | POA: Diagnosis not present

## 2023-08-19 DIAGNOSIS — E1169 Type 2 diabetes mellitus with other specified complication: Secondary | ICD-10-CM | POA: Diagnosis not present

## 2023-08-19 DIAGNOSIS — L97512 Non-pressure chronic ulcer of other part of right foot with fat layer exposed: Secondary | ICD-10-CM | POA: Diagnosis not present

## 2023-08-19 DIAGNOSIS — E11621 Type 2 diabetes mellitus with foot ulcer: Secondary | ICD-10-CM | POA: Diagnosis not present

## 2023-08-19 DIAGNOSIS — N1832 Chronic kidney disease, stage 3b: Secondary | ICD-10-CM | POA: Diagnosis not present

## 2023-08-19 DIAGNOSIS — L97514 Non-pressure chronic ulcer of other part of right foot with necrosis of bone: Secondary | ICD-10-CM | POA: Diagnosis not present

## 2023-08-19 NOTE — Progress Notes (Signed)
StatusCHORD, MCCURTY ([id number]) [id number] Page 5 of 8 Interventions: Assess patient/caregiver ability to obtain necessary supplies Assess patient/caregiver ability to perform ulcer/skin care regimen upon admission and as needed Assess ulceration(s) every visit Treatment Activities: Skin care regimen initiated : 03/17/2023 Topical wound management initiated : 03/17/2023 Notes: Electronic Signature(s) Signed: 08/19/2023 4:15:04 PM By: [name] Entered By: [name] on 08/19/2023 06:55:52 -------------------------------------------------------------------------------- Pain Assessment Details Patient Name: Date of Service: [name] RD, [name] 08/19/2023 9:30 [name] Medical Record Number: [id number] Patient Account Number: [id number] Date of Birth/Sex: Treating RN: [birth year] (72 y.o. [name]) Primary Care [name]: [name] Other Clinician: Referring [name]: Treating [name]/Extender: [name] in Treatment: 24 Active Problems Location of Pain Severity and Description of Pain Patient Has Paino No Site Locations Pain Management and Medication Current Pain Management: Electronic Signature(s) Signed: 08/19/2023 9:41:08 AM By: [name] Entered By: [name] on 08/19/2023 06:33:33 -------------------------------------------------------------------------------- Patient/Caregiver Education Details Patient Name: Date of Service: [name] RD, [name] 10/23/2024andnbsp9:30 [name], [name] ([id number]) [id number] Page 6 of 8 Medical Record Number: [id number] Patient Account Number: [id number] Date of Birth/Gender: Treating RN: 12-26-1950 (72 y.o. [name] Primary Care Physician: [name] Other Clinician: Referring Physician: Treating Physician/Extender: [name] in Treatment: 24 Education Assessment Education Provided To: Patient Education Topics Provided Wound/Skin Impairment: Methods: Explain/Verbal Responses: Reinforcements needed, State content correctly Electronic Signature(s) Signed: 08/19/2023 4:15:04 PM By: [name] Entered By: [name] on 08/19/2023 06:56:03 -------------------------------------------------------------------------------- Wound Assessment Details Patient Name: Date of Service: [name] RD, [name] 08/19/2023 9:30 [name] Medical Record Number: [id number] Patient Account Number: [id number] Date  of Birth/Sex: Treating RN: 07-Dec-1950 (72 y.o. [name] Primary Care Zali Kamaka: [name] Other Clinician: Referring Sharilynn Cassity: Treating [name]/Extender: [name] in Treatment: 24 Wound Status Wound Number: 8 Primary Diabetic Wound/Ulcer of the Lower Extremity Etiology: Wound Location: Right T Great oe Wound Open Wounding Event: Gradually Appeared Status: Date Acquired: 02/04/2023 Comorbid Hypertension, Type II Diabetes, Osteoarthritis, Neuropathy, [name] Of Treatment: 24 History: Received Radiation Clustered Wound: No Photos Wound Measurements Length: (cm) 2.4 Width: (cm) 1 Depth: (cm) 0.2 Area: (cm) 1.885 Volume: (cm) 0.377 % Reduction in Area: 83.7% % Reduction in Volume: 83.7% Epithelialization: Large (67-100%) Tunneling: No Undermining: No Wound Description Classification: Grade 3 Wound Margin: Distinct, outline attached Exudate Amount: Medium Exudate Type: Serosanguineous [name], [name] ([id number]) Exudate Color: red, brown Foul Odor After Cleansing: No Slough/Fibrino Yes [id number] Page 7 of 8 Wound Bed Granulation Amount: Large (67-100%) Exposed Structure Granulation Quality: Red Fascia Exposed: No Necrotic Amount: Small (1-33%) Fat Layer (Subcutaneous Tissue) Exposed: Yes Tendon Exposed: No Muscle Exposed: No Joint Exposed: No Bone Exposed: No Periwound Skin Texture Texture Color No Abnormalities Noted: No No Abnormalities Noted: Yes Callus: Yes Temperature / Pain Temperature: No Abnormality Moisture No Abnormalities Noted: No Dry / Scaly: Yes Maceration: No Treatment Notes Wound #8 (Toe Great) Wound Laterality: Right Cleanser Normal Saline Discharge Instruction: Cleanse the wound with Normal Saline prior to applying [name] clean dressing using gauze sponges, not tissue or cotton balls. Soap and Water Discharge Instruction: May shower and wash wound with dial antibacterial  soap and water prior to dressing change. Peri-Wound Care Topical Primary Dressing Promogran Prisma Matrix, 4.34 (sq in) (silver collagen) Discharge Instruction: Moisten collagen with saline or hydrogel Secondary Dressing Optifoam Non-Adhesive Dressing, 4x4 in Discharge Instruction: Apply over primary dressing cut to make donut Woven Gauze Sponges 2x2 in Discharge Instruction: Apply over primary dressing  1.885 N/[name] N/[name] [name] (cm) : rea 0.377 N/[name] N/[name] Volume (cm) : 83.70% N/[name] N/[name] % Reduction in [name] rea: 83.70% N/[name] N/[name] % Reduction in Volume: Grade 3 N/[name] N/[name] Classification: Medium N/[name] N/[name] Exudate [name] mount: Serosanguineous N/[name] N/[name] Exudate Type: red, brown N/[name] N/[name] Exudate Color: Distinct, outline attached N/[name] N/[name] Wound Margin: Large (67-100%) N/[name] N/[name] Granulation [name] mount: Red N/[name] N/[name] Granulation Quality: Small (1-33%) N/[name] N/[name] Necrotic [name] mount: Fat Layer (Subcutaneous Tissue): Yes N/[name] N/[name] Exposed Structures: Fascia: No Tendon: No Muscle: No Joint: No Bone: No Large (67-100%) N/[name] N/[name] Epithelialization: Debridement - Excisional N/[name] N/[name] Debridement: Pre-procedure Verification/Time Out 09:42 N/[name] N/[name] Taken: Callus, Subcutaneous, Slough N/[name] N/[name] Tissue Debrided: Skin/Subcutaneous Tissue N/[name] N/[name] Level: 1.88 N/[name] N/[name] Debridement [name] (sq cm): rea Curette N/[name] N/[name] Instrument: Minimum N/[name] N/[name] Bleeding: Pressure N/[name] N/[name] Hemostasis [name] chieved: Procedure was tolerated well N/[name] N/[name] Debridement Treatment Response: 2.4x1x0.2 N/[name] N/[name] Post Debridement Measurements L x W x D  (cm) 0.377 N/[name] N/[name] Post Debridement Volume: (cm) Callus: Yes N/[name] N/[name] Periwound Skin Texture: Dry/Scaly: Yes N/[name] N/[name] Periwound Skin Moisture: Maceration: No No Abnormalities Noted N/[name] N/[name] Periwound Skin Color: No Abnormality N/[name] N/[name] Temperature: Debridement N/[name] N/[name] Procedures Performed: Treatment Notes Wound #8 (Toe Great) Wound Laterality: Right Cleanser Normal Saline Discharge Instruction: Cleanse the wound with Normal Saline prior to applying [name] clean dressing using gauze sponges, not tissue or cotton balls. Soap and Water Discharge Instruction: May shower and wash wound with dial antibacterial soap and water prior to dressing change. Peri-Wound Care Topical Primary Dressing Promogran Prisma Matrix, 4.34 (sq in) (silver collagen) Discharge Instruction: Moisten collagen with saline or hydrogel Secondary Dressing Optifoam Non-Adhesive Dressing, 4x4 in Discharge Instruction: Apply over primary dressing cut to make donut Woven Gauze Sponges 2x2 in Discharge Instruction: Apply over primary dressing as directed. Secured With [employer], Sterile 2x75 (in/in) [place], Gustave ([id number]) [id number] Page 4 of 8 Discharge Instruction: Secure with stretch gauze as directed. 26M Medipore H Soft Cloth Surgical T ape, 4 x 10 (in/yd) Discharge Instruction: Secure with tape as directed. Compression Wrap Compression Stockings Add-Ons Electronic Signature(s) Signed: 08/19/2023 10:15:05 AM By: [name] MD FACS Entered By: [name] on 08/19/2023 07:15:05 -------------------------------------------------------------------------------- Multi-Disciplinary Care Plan Details Patient Name: Date of Service: [name] RD, [name] 08/19/2023 9:30 [name] Medical Record Number: [id number] Patient Account Number: [id number] Date of Birth/Sex: Treating RN: 28-Jun-1951 (72 y.o. [name] Primary Care Minah Axelrod: [name] Other  Clinician: Referring Adlee Paar: Treating Ashawna Hanback/Extender: [name] in Treatment: 24 Multidisciplinary Care Plan reviewed with physician Active Inactive Nutrition Nursing Diagnoses: Imbalanced nutrition Impaired glucose control: actual or potential Potential for alteratiion in Nutrition/Potential for imbalanced nutrition Goals: Patient/caregiver will maintain therapeutic glucose control Date Initiated: 03/17/2023 Target Resolution Date: 09/25/2023 Goal Status: Active Interventions: Assess HgA1c results as ordered upon admission and as needed Assess patient nutrition upon admission and as needed per policy Treatment Activities: Patient referred to Primary Care Physician for further nutritional evaluation : 03/17/2023 Notes: Wound/Skin Impairment Nursing Diagnoses: Impaired tissue integrity Knowledge deficit related to ulceration/compromised skin integrity Goals: Patient/caregiver will verbalize understanding of skin care regimen Date Initiated: 03/17/2023 Target Resolution Date: 09/25/2023 Goal Status: Active Ulcer/skin breakdown will have [name] volume reduction of 30% by week 4 Date Initiated: 03/17/2023 Date Inactivated: 04/02/2023 Target Resolution Date: 03/31/2023 Goal Status: Met Ulcer/skin breakdown will have [name] volume reduction of 50% by week 8 Date Initiated: 04/02/2023 Date Inactivated: 05/07/2023 Target Resolution Date: 04/30/2023 Goal  as directed. Secured With [employer] Stretch Gauze Bandage, Sterile 2x75 (in/in) Discharge Instruction: Secure with stretch gauze as directed. 34M Medipore H Soft Cloth Surgical T ape, 4 x 10 (in/yd) Discharge Instruction: Secure with tape as directed. Compression Wrap Compression Stockings Add-Ons Electronic Signature(s) Signed: 08/19/2023 4:15:04 PM By: [name] Entered By: [name] on 08/19/2023 06:48:18 -------------------------------------------------------------------------------- Vitals Details Patient Name: Date of Service: [name] RD, [name] 08/19/2023 9:30 [name] Medical Record Number: [id number] Patient Account Number: [id number] Date of Birth/Sex: Treating RN: 1951-04-24 (72 y.o. [name]) Primary Care Rodnisha Blomgren: [name] Other Clinician: [name] ([id number]) [id number] Page 8 of 8 Referring Jametta Moorehead: Treating Alaina Donati/Extender: [name] in Treatment: 24 Vital Signs Time Taken: 09:33 Temperature (F): 98.1 Height (in): 67 Pulse (bpm): 80 Weight (lbs): 154 Respiratory Rate (breaths/min): 18 Body Mass Index (BMI): 24.1 Blood Pressure (mmHg): 199/90 Reference Range: 80 - 120 mg / dl Electronic Signature(s) Signed: 08/19/2023 9:41:08 AM By: [name] Entered By: [name] on 08/19/2023 62:13:08  StatusCHORD, MCCURTY ([id number]) [id number] Page 5 of 8 Interventions: Assess patient/caregiver ability to obtain necessary supplies Assess patient/caregiver ability to perform ulcer/skin care regimen upon admission and as needed Assess ulceration(s) every visit Treatment Activities: Skin care regimen initiated : 03/17/2023 Topical wound management initiated : 03/17/2023 Notes: Electronic Signature(s) Signed: 08/19/2023 4:15:04 PM By: [name] Entered By: [name] on 08/19/2023 06:55:52 -------------------------------------------------------------------------------- Pain Assessment Details Patient Name: Date of Service: [name] RD, [name] 08/19/2023 9:30 [name] Medical Record Number: [id number] Patient Account Number: [id number] Date of Birth/Sex: Treating RN: [birth year] (72 y.o. [name]) Primary Care [name]: [name] Other Clinician: Referring [name]: Treating [name]/Extender: [name] in Treatment: 24 Active Problems Location of Pain Severity and Description of Pain Patient Has Paino No Site Locations Pain Management and Medication Current Pain Management: Electronic Signature(s) Signed: 08/19/2023 9:41:08 AM By: [name] Entered By: [name] on 08/19/2023 06:33:33 -------------------------------------------------------------------------------- Patient/Caregiver Education Details Patient Name: Date of Service: [name] RD, [name] 10/23/2024andnbsp9:30 [name], [name] ([id number]) [id number] Page 6 of 8 Medical Record Number: [id number] Patient Account Number: [id number] Date of Birth/Gender: Treating RN: 12-26-1950 (72 y.o. [name] Primary Care Physician: [name] Other Clinician: Referring Physician: Treating Physician/Extender: [name] in Treatment: 24 Education Assessment Education Provided To: Patient Education Topics Provided Wound/Skin Impairment: Methods: Explain/Verbal Responses: Reinforcements needed, State content correctly Electronic Signature(s) Signed: 08/19/2023 4:15:04 PM By: [name] Entered By: [name] on 08/19/2023 06:56:03 -------------------------------------------------------------------------------- Wound Assessment Details Patient Name: Date of Service: [name] RD, [name] 08/19/2023 9:30 [name] Medical Record Number: [id number] Patient Account Number: [id number] Date  of Birth/Sex: Treating RN: 07-Dec-1950 (72 y.o. [name] Primary Care Zali Kamaka: [name] Other Clinician: Referring Sharilynn Cassity: Treating [name]/Extender: [name] in Treatment: 24 Wound Status Wound Number: 8 Primary Diabetic Wound/Ulcer of the Lower Extremity Etiology: Wound Location: Right T Great oe Wound Open Wounding Event: Gradually Appeared Status: Date Acquired: 02/04/2023 Comorbid Hypertension, Type II Diabetes, Osteoarthritis, Neuropathy, [name] Of Treatment: 24 History: Received Radiation Clustered Wound: No Photos Wound Measurements Length: (cm) 2.4 Width: (cm) 1 Depth: (cm) 0.2 Area: (cm) 1.885 Volume: (cm) 0.377 % Reduction in Area: 83.7% % Reduction in Volume: 83.7% Epithelialization: Large (67-100%) Tunneling: No Undermining: No Wound Description Classification: Grade 3 Wound Margin: Distinct, outline attached Exudate Amount: Medium Exudate Type: Serosanguineous [name], [name] ([id number]) Exudate Color: red, brown Foul Odor After Cleansing: No Slough/Fibrino Yes [id number] Page 7 of 8 Wound Bed Granulation Amount: Large (67-100%) Exposed Structure Granulation Quality: Red Fascia Exposed: No Necrotic Amount: Small (1-33%) Fat Layer (Subcutaneous Tissue) Exposed: Yes Tendon Exposed: No Muscle Exposed: No Joint Exposed: No Bone Exposed: No Periwound Skin Texture Texture Color No Abnormalities Noted: No No Abnormalities Noted: Yes Callus: Yes Temperature / Pain Temperature: No Abnormality Moisture No Abnormalities Noted: No Dry / Scaly: Yes Maceration: No Treatment Notes Wound #8 (Toe Great) Wound Laterality: Right Cleanser Normal Saline Discharge Instruction: Cleanse the wound with Normal Saline prior to applying [name] clean dressing using gauze sponges, not tissue or cotton balls. Soap and Water Discharge Instruction: May shower and wash wound with dial antibacterial  soap and water prior to dressing change. Peri-Wound Care Topical Primary Dressing Promogran Prisma Matrix, 4.34 (sq in) (silver collagen) Discharge Instruction: Moisten collagen with saline or hydrogel Secondary Dressing Optifoam Non-Adhesive Dressing, 4x4 in Discharge Instruction: Apply over primary dressing cut to make donut Woven Gauze Sponges 2x2 in Discharge Instruction: Apply over primary dressing

## 2023-08-19 NOTE — Progress Notes (Signed)
03/02/2023 No Yes M86.171 Other acute osteomyelitis, right ankle  and foot 03/02/2023 No Yes E11.621 Type 2 diabetes mellitus with foot ulcer 03/02/2023 No Yes I10 Essential (primary) hypertension 03/02/2023 No Yes N18.32 Chronic kidney disease, stage 3b 03/02/2023 No Yes [name] ([id number]) [id number] Page 6 of 11 Inactive Problems Resolved Problems ICD-10 Code Description Active Date Resolved Date L97.512 Non-pressure chronic ulcer of other part of right foot with fat layer exposed 03/09/2023 03/09/2023 Electronic Signature(s) Signed: 08/19/2023 10:14:26 AM By: [name] Guess MD FACS Entered By: [name] on 08/19/2023 07:14:26 -------------------------------------------------------------------------------- Progress Note Details Patient Name: Date of Service: [name] [name], A [name] 08/19/2023 9:30 A M Medical Record Number: [id number] Patient Account Number: [id number] Date of Birth/Sex: Treating RN: 1951-06-01 (72 y.o. M) Primary Care Provider: Abbe [name] Other Clinician: Referring Provider: Treating Provider/Extender: [name] in Treatment: 24 Subjective Chief Complaint Information obtained from Patient Bilateral T Ulcers 03/02/2023: right great toe ulcer with osteomyelitis oe History of Present Illness (HPI) 09/26/2020 on evaluation today patient appears to be doing somewhat poorly in regard to his bilateral feet at this point due to issues that he is been having that developed about 2 weeks ago. He was walking home which was about a mile and states that he first noted the areas on the right foot beginning. These were blisters that develop. The left started Thanksgiving day when he was up standing more cooking. He did see his primary care provider who referred him to podiatry. He saw Dr. Allena [name] and Dr. Allena [name] recommended Betadine moistened gauze dressings to the wounds and to follow-up in 2 weeks. With that being said the patient subsequently was concerned about infection 2 to 3 days  later as was his family member who is with him today. I believe this was a sister. With that being said subsequently he ended up going to urgent care at that point he was given Diflucan and Keflex he is done with both at this time. He is could be canceling the appointment with Dr. Allena [name] they tell me at this point. His most recent hemoglobin A1c was 6.6 on November 12. His ABIs were 1.2 on the right and 1.03 on the left and appear to be doing excellent. With that being said he does not have diabetic shoes and I feel like the shoes were likely the culprit for what led to this issue currently. There does not appear to be any signs of systemic infection nor local infection at this point which is great news. The patient does have a history of hypertension as well as potentially a recurrence of prostate cancer he is being followed by the cancer center for that at this point 10/03/2020 upon evaluation today patient appears to be doing better in regard to his toe ulcers. He has been tolerating the dressing changes without complication. Fortunately there is no signs of active infection at this time. The right great toe and fifth toe are the 2 worst out of everything here he has a couple that have healed on the left foot there is really nothing remaining open though he has several areas of callus on the left foot in fact 3 on the third fourth and fifth toes. That is going need to be removed today as well. 10/10/2020 upon evaluation today patient appears to be doing well in regard to his wounds. He still has wounds open on the first, second, and fifth toes of the right foot. Left foot is completely healed and the right fourth  the callus which caused the enlargement of his wound. I also recommended that once he is healed, that he use 40% urea cream to keep his calluses soft to prevent breakdown underneath this layer. We will continue Prisma silver collagen to his wound. Continue forefoot offloading shoe. Follow-up in 2 weeks, per patient preference. Electronic Signature(s) Signed: 08/19/2023 10:19:22 AM By: [name] Guess MD FACS Entered By: [name] on 08/19/2023 07:19:22 -------------------------------------------------------------------------------- HxROS Details Patient Name: Date of Service: [name], A [name] 08/19/2023 9:30 A M Medical Record Number: [id number] Patient Account Number: [id number] Date of Birth/Sex: Treating RN: [birth year] (72 y.o. M) Primary Care Provider: Abbe [name] Other Clinician: Referring Provider: Treating Provider/Extender: [name] in Treatment: [address] [place] ([id number]) [id number] Page 10 of 11 Information Obtained From Patient Eyes Medical History: Past Medical History Notes: Diabetic retinopathy Cardiovascular Medical History: Positive for: Hypertension Past Medical History Notes: Carotid artery stenosis Endocrine Medical History: Positive for: Type II Diabetes Negative for: Type I Diabetes Time with diabetes: 5 years Treated with: Insulin Blood sugar tested every day: Yes Tested : twice a day Musculoskeletal Medical History: Positive for: Osteoarthritis Neurologic Medical History: Positive for: Neuropathy Oncologic Medical History: Positive for: Received Radiation Past Medical History Notes: Prostate cancer Immunizations Pneumococcal Vaccine: Received Pneumococcal Vaccination: No Implantable Devices None Family and Social History Cancer: No; Diabetes: Yes - Mother,Siblings; Heart Disease: No; Hereditary Spherocytosis: No; Hypertension: No; Kidney Disease: No; Lung Disease: No; Seizures: No; Stroke: No; Thyroid Problems: No; Tuberculosis: No; Never smoker; Marital Status - Single; Alcohol Use: Never; Drug Use: No History; Caffeine Use: Rarely; Financial Concerns: No; Food, Clothing or Shelter Needs: No; Support System Lacking: No; Transportation Concerns: No Electronic Signature(s) Signed: 08/19/2023 11:18:33 AM By: [name] Guess MD FACS Entered By: [name] on 08/19/2023 07:16:33 -------------------------------------------------------------------------------- SuperBill Details Patient Name: Date of Service: [name] [name], A [name] 08/19/2023 Medical Record Number: [id number] Patient Account Number: [id number] Date of Birth/Sex: Treating RN: 14-Aug-1951 (72 y.o. M) Primary Care Provider: Abbe [name] Other Clinician: Referring Provider: Treating Provider/Extender: [name] in Treatment: [address]  [place] ([id number]) [id number] Page 11 of 11 Diagnosis Coding ICD-10 Codes Code Description L97.514 Non-pressure chronic ulcer of other part of right foot with necrosis of bone M86.171 Other acute osteomyelitis, right ankle and foot E11.621 Type 2 diabetes mellitus with foot ulcer I10 Essential (primary) hypertension N18.32 Chronic kidney disease, stage 3b Facility Procedures : CPT4 Code: 16010932 Description: 11042 - DEB SUBQ TISSUE 20 SQ CM/< ICD-10 Diagnosis Description L97.514 Non-pressure chronic ulcer of other part of right foot with necrosis of bone Modifier: Quantity: 1 Physician Procedures : CPT4 Code Description Modifier [id number] 99213 - WC PHYS LEVEL 3 - EST PT 25 ICD-10 Diagnosis Description L97.514 Non-pressure chronic ulcer of other part of right foot with necrosis of bone M86.171 Other acute osteomyelitis, right ankle and foot E11.621  Type 2 diabetes mellitus with foot ulcer N18.32 Chronic kidney disease, stage 3b Quantity: 1 : [id number] 11042 - WC PHYS SUBQ TISS 20 SQ CM ICD-10 Diagnosis Description L97.514 Non-pressure chronic ulcer of other part of right foot with necrosis of bone Quantity: 1 Electronic Signature(s) Signed: 08/19/2023 10:19:41 AM By: [name] Guess MD FACS Entered By: [name] on 08/19/2023 07:19:41  03/02/2023 No Yes M86.171 Other acute osteomyelitis, right ankle  and foot 03/02/2023 No Yes E11.621 Type 2 diabetes mellitus with foot ulcer 03/02/2023 No Yes I10 Essential (primary) hypertension 03/02/2023 No Yes N18.32 Chronic kidney disease, stage 3b 03/02/2023 No Yes [name] ([id number]) [id number] Page 6 of 11 Inactive Problems Resolved Problems ICD-10 Code Description Active Date Resolved Date L97.512 Non-pressure chronic ulcer of other part of right foot with fat layer exposed 03/09/2023 03/09/2023 Electronic Signature(s) Signed: 08/19/2023 10:14:26 AM By: [name] Guess MD FACS Entered By: [name] on 08/19/2023 07:14:26 -------------------------------------------------------------------------------- Progress Note Details Patient Name: Date of Service: [name] [name], A [name] 08/19/2023 9:30 A M Medical Record Number: [id number] Patient Account Number: [id number] Date of Birth/Sex: Treating RN: 1951-06-01 (72 y.o. M) Primary Care Provider: Abbe [name] Other Clinician: Referring Provider: Treating Provider/Extender: [name] in Treatment: 24 Subjective Chief Complaint Information obtained from Patient Bilateral T Ulcers 03/02/2023: right great toe ulcer with osteomyelitis oe History of Present Illness (HPI) 09/26/2020 on evaluation today patient appears to be doing somewhat poorly in regard to his bilateral feet at this point due to issues that he is been having that developed about 2 weeks ago. He was walking home which was about a mile and states that he first noted the areas on the right foot beginning. These were blisters that develop. The left started Thanksgiving day when he was up standing more cooking. He did see his primary care provider who referred him to podiatry. He saw Dr. Allena [name] and Dr. Allena [name] recommended Betadine moistened gauze dressings to the wounds and to follow-up in 2 weeks. With that being said the patient subsequently was concerned about infection 2 to 3 days  later as was his family member who is with him today. I believe this was a sister. With that being said subsequently he ended up going to urgent care at that point he was given Diflucan and Keflex he is done with both at this time. He is could be canceling the appointment with Dr. Allena [name] they tell me at this point. His most recent hemoglobin A1c was 6.6 on November 12. His ABIs were 1.2 on the right and 1.03 on the left and appear to be doing excellent. With that being said he does not have diabetic shoes and I feel like the shoes were likely the culprit for what led to this issue currently. There does not appear to be any signs of systemic infection nor local infection at this point which is great news. The patient does have a history of hypertension as well as potentially a recurrence of prostate cancer he is being followed by the cancer center for that at this point 10/03/2020 upon evaluation today patient appears to be doing better in regard to his toe ulcers. He has been tolerating the dressing changes without complication. Fortunately there is no signs of active infection at this time. The right great toe and fifth toe are the 2 worst out of everything here he has a couple that have healed on the left foot there is really nothing remaining open though he has several areas of callus on the left foot in fact 3 on the third fourth and fifth toes. That is going need to be removed today as well. 10/10/2020 upon evaluation today patient appears to be doing well in regard to his wounds. He still has wounds open on the first, second, and fifth toes of the right foot. Left foot is completely healed and the right fourth  A M Medical Record Number: [id number] Patient Account Number: [id number] Date of Birth/Sex: Treating RN: 1950/12/11 (72 y.o. M) Primary Care Provider: Abbe [name] Other Clinician: Referring Provider: Treating Provider/Extender: [name]: 24 Constitutional Hypertensive, asymptomatic. . . . no acute distress. Respiratory Normal work of breathing on room air.. Notes 08/19/2023: T oday, the main  area of the wound is quite small and superficial. There is some discoloration, however, underneath callus near the lateral aspect of the tip of his toe. It appears that moisture has resulted in tissue breakdown and this area is open. Electronic Signature(s) Signed: 08/19/2023 10:17:25 AM By: [name] Guess MD FACS Entered By: [name] on 08/19/2023 07:17:25 -------------------------------------------------------------------------------- Physician Orders Details Patient Name: Date of Service: [name] [name], A [name] 08/19/2023 9:30 A M Medical Record Number: [id number] Patient Account Number: [id number] Date of Birth/Sex: Treating RN: 10-Aug-1951 (72 y.o. [name] Primary Care Provider: Abbe [name] Other Clinician: Referring Provider: Treating Provider/Extender: [name] in Treatment: 24 The following information was scribed by: Samuella Bruin The information was scribed for: [name] Verbal / Phone Orders: No Diagnosis Coding ICD-10 Coding Code Description L97.514 Non-pressure chronic ulcer of other part of right foot with necrosis of bone M86.171 Other acute osteomyelitis, right ankle and foot E11.621 Type 2 diabetes mellitus with foot ulcer I10 Essential (primary) hypertension N18.32 Chronic kidney disease, stage 3b Follow-up Appointments ppointment in 2 weeks. - Dr Lady Gary Rm 1 Return A Bathing/ Shower/ Hygiene May shower and wash wound with soap and water. Edema Control - Lymphedema / SCD / Other Bilateral Lower Extremities Avoid standing for long periods of time. Exercise regularly KAELEN, MOAK ([id number]) [id number] Page 5 of 11 Moisturize legs daily. Off-Loading Wound #8 Right T Great oe Other: - Wear the [employer] front offloading sandal when walking, minimal weight bearing right foot Wound Treatment Wound #8 - T Great oe Wound Laterality: Right Cleanser: Normal Saline  (Generic) 1 x Per Day/30 Days Discharge Instructions: Cleanse the wound with Normal Saline prior to applying a clean dressing using gauze sponges, not tissue or cotton balls. Cleanser: Soap and Water 1 x Per Day/30 Days Discharge Instructions: May shower and wash wound with dial antibacterial soap and water prior to dressing change. Prim Dressing: Promogran Prisma Matrix, 4.34 (sq in) (silver collagen) 1 x Per Day/30 Days ary Discharge Instructions: Moisten collagen with saline or hydrogel Secondary Dressing: Optifoam Non-Adhesive Dressing, 4x4 in 1 x Per Day/30 Days Discharge Instructions: Apply over primary dressing cut to make donut Secondary Dressing: Woven Gauze Sponges 2x2 in (Generic) 1 x Per Day/30 Days Discharge Instructions: Apply over primary dressing as directed. Secured With: Insurance underwriter, Sterile 2x75 (in/in) (Generic) 1 x Per Day/30 Days Discharge Instructions: Secure with stretch gauze as directed. Secured With: 37M Medipore H Soft Cloth Surgical T ape, 4 x 10 (in/yd) (Generic) 1 x Per Day/30 Days Discharge Instructions: Secure with tape as directed. Electronic Signature(s) Signed: 08/19/2023 11:18:33 AM By: [name] Guess MD FACS Entered By: [name] on 08/19/2023 07:17:37 -------------------------------------------------------------------------------- Problem List Details Patient Name: Date of Service: [name] [name], A [name] 08/19/2023 9:30 A M Medical Record Number: [id number] Patient Account Number: [id number] Date of Birth/Sex: Treating RN: 12-14-1950 (72 y.o. M) Primary Care Provider: Abbe [name] Other Clinician: Referring Provider: Treating Provider/Extender: [name] in Treatment: 24 Active Problems ICD-10 Encounter Code Description Active Date MDM Diagnosis L97.514 Non-pressure chronic ulcer of other part of right foot with necrosis of bone  the callus which caused the enlargement of his wound. I also recommended that once he is healed, that he use 40% urea cream to keep his calluses soft to prevent breakdown underneath this layer. We will continue Prisma silver collagen to his wound. Continue forefoot offloading shoe. Follow-up in 2 weeks, per patient preference. Electronic Signature(s) Signed: 08/19/2023 10:19:22 AM By: [name] Guess MD FACS Entered By: [name] on 08/19/2023 07:19:22 -------------------------------------------------------------------------------- HxROS Details Patient Name: Date of Service: [name], A [name] 08/19/2023 9:30 A M Medical Record Number: [id number] Patient Account Number: [id number] Date of Birth/Sex: Treating RN: [birth year] (72 y.o. M) Primary Care Provider: Abbe [name] Other Clinician: Referring Provider: Treating Provider/Extender: [name] in Treatment: [address] [place] ([id number]) [id number] Page 10 of 11 Information Obtained From Patient Eyes Medical History: Past Medical History Notes: Diabetic retinopathy Cardiovascular Medical History: Positive for: Hypertension Past Medical History Notes: Carotid artery stenosis Endocrine Medical History: Positive for: Type II Diabetes Negative for: Type I Diabetes Time with diabetes: 5 years Treated with: Insulin Blood sugar tested every day: Yes Tested : twice a day Musculoskeletal Medical History: Positive for: Osteoarthritis Neurologic Medical History: Positive for: Neuropathy Oncologic Medical History: Positive for: Received Radiation Past Medical History Notes: Prostate cancer Immunizations Pneumococcal Vaccine: Received Pneumococcal Vaccination: No Implantable Devices None Family and Social History Cancer: No; Diabetes: Yes - Mother,Siblings; Heart Disease: No; Hereditary Spherocytosis: No; Hypertension: No; Kidney Disease: No; Lung Disease: No; Seizures: No; Stroke: No; Thyroid Problems: No; Tuberculosis: No; Never smoker; Marital Status - Single; Alcohol Use: Never; Drug Use: No History; Caffeine Use: Rarely; Financial Concerns: No; Food, Clothing or Shelter Needs: No; Support System Lacking: No; Transportation Concerns: No Electronic Signature(s) Signed: 08/19/2023 11:18:33 AM By: [name] Guess MD FACS Entered By: [name] on 08/19/2023 07:16:33 -------------------------------------------------------------------------------- SuperBill Details Patient Name: Date of Service: [name] [name], A [name] 08/19/2023 Medical Record Number: [id number] Patient Account Number: [id number] Date of Birth/Sex: Treating RN: 14-Aug-1951 (72 y.o. M) Primary Care Provider: Abbe [name] Other Clinician: Referring Provider: Treating Provider/Extender: [name] in Treatment: [address]  [place] ([id number]) [id number] Page 11 of 11 Diagnosis Coding ICD-10 Codes Code Description L97.514 Non-pressure chronic ulcer of other part of right foot with necrosis of bone M86.171 Other acute osteomyelitis, right ankle and foot E11.621 Type 2 diabetes mellitus with foot ulcer I10 Essential (primary) hypertension N18.32 Chronic kidney disease, stage 3b Facility Procedures : CPT4 Code: 16010932 Description: 11042 - DEB SUBQ TISSUE 20 SQ CM/< ICD-10 Diagnosis Description L97.514 Non-pressure chronic ulcer of other part of right foot with necrosis of bone Modifier: Quantity: 1 Physician Procedures : CPT4 Code Description Modifier [id number] 99213 - WC PHYS LEVEL 3 - EST PT 25 ICD-10 Diagnosis Description L97.514 Non-pressure chronic ulcer of other part of right foot with necrosis of bone M86.171 Other acute osteomyelitis, right ankle and foot E11.621  Type 2 diabetes mellitus with foot ulcer N18.32 Chronic kidney disease, stage 3b Quantity: 1 : [id number] 11042 - WC PHYS SUBQ TISS 20 SQ CM ICD-10 Diagnosis Description L97.514 Non-pressure chronic ulcer of other part of right foot with necrosis of bone Quantity: 1 Electronic Signature(s) Signed: 08/19/2023 10:19:41 AM By: [name] Guess MD FACS Entered By: [name] on 08/19/2023 07:19:41  the callus which caused the enlargement of his wound. I also recommended that once he is healed, that he use 40% urea cream to keep his calluses soft to prevent breakdown underneath this layer. We will continue Prisma silver collagen to his wound. Continue forefoot offloading shoe. Follow-up in 2 weeks, per patient preference. Electronic Signature(s) Signed: 08/19/2023 10:19:22 AM By: [name] Guess MD FACS Entered By: [name] on 08/19/2023 07:19:22 -------------------------------------------------------------------------------- HxROS Details Patient Name: Date of Service: [name], A [name] 08/19/2023 9:30 A M Medical Record Number: [id number] Patient Account Number: [id number] Date of Birth/Sex: Treating RN: [birth year] (72 y.o. M) Primary Care Provider: Abbe [name] Other Clinician: Referring Provider: Treating Provider/Extender: [name] in Treatment: [address] [place] ([id number]) [id number] Page 10 of 11 Information Obtained From Patient Eyes Medical History: Past Medical History Notes: Diabetic retinopathy Cardiovascular Medical History: Positive for: Hypertension Past Medical History Notes: Carotid artery stenosis Endocrine Medical History: Positive for: Type II Diabetes Negative for: Type I Diabetes Time with diabetes: 5 years Treated with: Insulin Blood sugar tested every day: Yes Tested : twice a day Musculoskeletal Medical History: Positive for: Osteoarthritis Neurologic Medical History: Positive for: Neuropathy Oncologic Medical History: Positive for: Received Radiation Past Medical History Notes: Prostate cancer Immunizations Pneumococcal Vaccine: Received Pneumococcal Vaccination: No Implantable Devices None Family and Social History Cancer: No; Diabetes: Yes - Mother,Siblings; Heart Disease: No; Hereditary Spherocytosis: No; Hypertension: No; Kidney Disease: No; Lung Disease: No; Seizures: No; Stroke: No; Thyroid Problems: No; Tuberculosis: No; Never smoker; Marital Status - Single; Alcohol Use: Never; Drug Use: No History; Caffeine Use: Rarely; Financial Concerns: No; Food, Clothing or Shelter Needs: No; Support System Lacking: No; Transportation Concerns: No Electronic Signature(s) Signed: 08/19/2023 11:18:33 AM By: [name] Guess MD FACS Entered By: [name] on 08/19/2023 07:16:33 -------------------------------------------------------------------------------- SuperBill Details Patient Name: Date of Service: [name] [name], A [name] 08/19/2023 Medical Record Number: [id number] Patient Account Number: [id number] Date of Birth/Sex: Treating RN: 14-Aug-1951 (72 y.o. M) Primary Care Provider: Abbe [name] Other Clinician: Referring Provider: Treating Provider/Extender: [name] in Treatment: [address]  [place] ([id number]) [id number] Page 11 of 11 Diagnosis Coding ICD-10 Codes Code Description L97.514 Non-pressure chronic ulcer of other part of right foot with necrosis of bone M86.171 Other acute osteomyelitis, right ankle and foot E11.621 Type 2 diabetes mellitus with foot ulcer I10 Essential (primary) hypertension N18.32 Chronic kidney disease, stage 3b Facility Procedures : CPT4 Code: 16010932 Description: 11042 - DEB SUBQ TISSUE 20 SQ CM/< ICD-10 Diagnosis Description L97.514 Non-pressure chronic ulcer of other part of right foot with necrosis of bone Modifier: Quantity: 1 Physician Procedures : CPT4 Code Description Modifier [id number] 99213 - WC PHYS LEVEL 3 - EST PT 25 ICD-10 Diagnosis Description L97.514 Non-pressure chronic ulcer of other part of right foot with necrosis of bone M86.171 Other acute osteomyelitis, right ankle and foot E11.621  Type 2 diabetes mellitus with foot ulcer N18.32 Chronic kidney disease, stage 3b Quantity: 1 : [id number] 11042 - WC PHYS SUBQ TISS 20 SQ CM ICD-10 Diagnosis Description L97.514 Non-pressure chronic ulcer of other part of right foot with necrosis of bone Quantity: 1 Electronic Signature(s) Signed: 08/19/2023 10:19:41 AM By: [name] Guess MD FACS Entered By: [name] on 08/19/2023 07:19:41  [name], [name] ([id number]) [id number] Page 1 of 11 Visit Report for 08/19/2023 Chief Complaint Document Details Patient Name: Date of Service: [name] 08/19/2023 9:30 A M Medical Record Number: [id number] Patient Account Number: [id number] Date of Birth/Sex: Treating RN: Mar 18, 1951 (72 y.o. M) Primary Care Provider: Abbe [name] Other Clinician: Referring Provider: Treating Provider/Extender: [name] in Treatment: 24 Information Obtained from: Patient Chief Complaint Bilateral T Ulcers oe 03/02/2023: right great toe ulcer with osteomyelitis Electronic Signature(s) Signed: 08/19/2023 10:15:16 AM By: [name] Guess MD FACS Entered By: [name] on 08/19/2023 07:15:16 -------------------------------------------------------------------------------- Debridement Details Patient Name: Date of Service: [name] [name], A [name] 08/19/2023 9:30 A M Medical Record Number: [id number] Patient Account Number: [id number] Date of Birth/Sex: Treating RN: October 11, 1951 (72 y.o. [name] Primary Care Provider: Abbe [name] Other Clinician: Referring Provider: Treating Provider/Extender: [name]: 24 Debridement Performed for Assessment: Wound #8 Right T Great oe Performed By: Physician [name] Guess, MD The following information was scribed by: Samuella Bruin The information was scribed for: [name] Debridement Type: Debridement Severity of Tissue Pre Debridement: Fat layer exposed Level of Consciousness (Pre-procedure): Awake and Alert Pre-procedure Verification/Time Out Yes - 09:42 Taken: Start Time: 09:42 Percent of Wound Bed Debrided: 100% T Area Debrided (cm): otal 1.88 Tissue and other material debrided: Non-Viable, Callus, Slough, Subcutaneous, Slough Level: Skin/Subcutaneous Tissue Debridement Description: Excisional Instrument:  Curette Bleeding: Minimum Hemostasis Achieved: Pressure Response to Treatment: Procedure was tolerated well Level of Consciousness (Post- Awake and Alert procedure): Post Debridement Measurements of Total Wound Length: (cm) 2.4 Width: (cm) 1 Depth: (cm) 0.2 Volume: (cm) 0.377 Character of Wound/Ulcer Post Debridement: Improved Agnes, [name] ([id number]) [id number] Page 2 of 11 Severity of Tissue Post Debridement: Fat layer exposed Post Procedure Diagnosis Same as Pre-procedure Electronic Signature(s) Signed: 08/19/2023 11:18:33 AM By: [name] Guess MD FACS Signed: 08/19/2023 4:15:04 PM By: Gelene Mink By: Samuella Bruin on 08/19/2023 06:48:34 -------------------------------------------------------------------------------- HPI Details Patient Name: Date of Service: [name], A [name] 08/19/2023 9:30 A M Medical Record Number: [id number] Patient Account Number: [id number] Date of Birth/Sex: Treating RN: [birth year] (72 y.o. M) Primary Care Provider: Abbe [name] Other Clinician: Referring Provider: Treating Provider/Extender: [name] in Treatment: 24 History of Present Illness HPI Description: 09/26/2020 on evaluation today patient appears to be doing somewhat poorly in regard to his bilateral feet at this point due to issues that he is been having that developed about 2 weeks ago. He was walking home which was about a mile and states that he first noted the areas on the right foot beginning. These were blisters that develop. The left started Thanksgiving day when he was up standing more cooking. He did see his primary care provider who referred him to podiatry. He saw Dr. Allena [name] and Dr. Allena [name] recommended Betadine moistened gauze dressings to the wounds and to follow-up in 2 weeks. With that being said the patient subsequently was concerned about infection 2 to 3 days later as was his family member who  is with him today. I believe this was a sister. With that being said subsequently he ended up going to urgent care at that point he was given Diflucan and Keflex he is done with both at this time. He is could be canceling the appointment with Dr. Allena [name] they tell me at this point. His most recent hemoglobin A1c was 6.6 on November 12. His ABIs were 1.2 on the right and 1.03 on the left and  A M Medical Record Number: [id number] Patient Account Number: [id number] Date of Birth/Sex: Treating RN: 1950/12/11 (72 y.o. M) Primary Care Provider: Abbe [name] Other Clinician: Referring Provider: Treating Provider/Extender: [name]: 24 Constitutional Hypertensive, asymptomatic. . . . no acute distress. Respiratory Normal work of breathing on room air.. Notes 08/19/2023: T oday, the main  area of the wound is quite small and superficial. There is some discoloration, however, underneath callus near the lateral aspect of the tip of his toe. It appears that moisture has resulted in tissue breakdown and this area is open. Electronic Signature(s) Signed: 08/19/2023 10:17:25 AM By: [name] Guess MD FACS Entered By: [name] on 08/19/2023 07:17:25 -------------------------------------------------------------------------------- Physician Orders Details Patient Name: Date of Service: [name] [name], A [name] 08/19/2023 9:30 A M Medical Record Number: [id number] Patient Account Number: [id number] Date of Birth/Sex: Treating RN: 10-Aug-1951 (72 y.o. [name] Primary Care Provider: Abbe [name] Other Clinician: Referring Provider: Treating Provider/Extender: [name] in Treatment: 24 The following information was scribed by: Samuella Bruin The information was scribed for: [name] Verbal / Phone Orders: No Diagnosis Coding ICD-10 Coding Code Description L97.514 Non-pressure chronic ulcer of other part of right foot with necrosis of bone M86.171 Other acute osteomyelitis, right ankle and foot E11.621 Type 2 diabetes mellitus with foot ulcer I10 Essential (primary) hypertension N18.32 Chronic kidney disease, stage 3b Follow-up Appointments ppointment in 2 weeks. - Dr Lady Gary Rm 1 Return A Bathing/ Shower/ Hygiene May shower and wash wound with soap and water. Edema Control - Lymphedema / SCD / Other Bilateral Lower Extremities Avoid standing for long periods of time. Exercise regularly KAELEN, MOAK ([id number]) [id number] Page 5 of 11 Moisturize legs daily. Off-Loading Wound #8 Right T Great oe Other: - Wear the [employer] front offloading sandal when walking, minimal weight bearing right foot Wound Treatment Wound #8 - T Great oe Wound Laterality: Right Cleanser: Normal Saline  (Generic) 1 x Per Day/30 Days Discharge Instructions: Cleanse the wound with Normal Saline prior to applying a clean dressing using gauze sponges, not tissue or cotton balls. Cleanser: Soap and Water 1 x Per Day/30 Days Discharge Instructions: May shower and wash wound with dial antibacterial soap and water prior to dressing change. Prim Dressing: Promogran Prisma Matrix, 4.34 (sq in) (silver collagen) 1 x Per Day/30 Days ary Discharge Instructions: Moisten collagen with saline or hydrogel Secondary Dressing: Optifoam Non-Adhesive Dressing, 4x4 in 1 x Per Day/30 Days Discharge Instructions: Apply over primary dressing cut to make donut Secondary Dressing: Woven Gauze Sponges 2x2 in (Generic) 1 x Per Day/30 Days Discharge Instructions: Apply over primary dressing as directed. Secured With: Insurance underwriter, Sterile 2x75 (in/in) (Generic) 1 x Per Day/30 Days Discharge Instructions: Secure with stretch gauze as directed. Secured With: 37M Medipore H Soft Cloth Surgical T ape, 4 x 10 (in/yd) (Generic) 1 x Per Day/30 Days Discharge Instructions: Secure with tape as directed. Electronic Signature(s) Signed: 08/19/2023 11:18:33 AM By: [name] Guess MD FACS Entered By: [name] on 08/19/2023 07:17:37 -------------------------------------------------------------------------------- Problem List Details Patient Name: Date of Service: [name] [name], A [name] 08/19/2023 9:30 A M Medical Record Number: [id number] Patient Account Number: [id number] Date of Birth/Sex: Treating RN: 12-14-1950 (72 y.o. M) Primary Care Provider: Abbe [name] Other Clinician: Referring Provider: Treating Provider/Extender: [name] in Treatment: 24 Active Problems ICD-10 Encounter Code Description Active Date MDM Diagnosis L97.514 Non-pressure chronic ulcer of other part of right foot with necrosis of bone  03/02/2023 No Yes M86.171 Other acute osteomyelitis, right ankle  and foot 03/02/2023 No Yes E11.621 Type 2 diabetes mellitus with foot ulcer 03/02/2023 No Yes I10 Essential (primary) hypertension 03/02/2023 No Yes N18.32 Chronic kidney disease, stage 3b 03/02/2023 No Yes [name] ([id number]) [id number] Page 6 of 11 Inactive Problems Resolved Problems ICD-10 Code Description Active Date Resolved Date L97.512 Non-pressure chronic ulcer of other part of right foot with fat layer exposed 03/09/2023 03/09/2023 Electronic Signature(s) Signed: 08/19/2023 10:14:26 AM By: [name] Guess MD FACS Entered By: [name] on 08/19/2023 07:14:26 -------------------------------------------------------------------------------- Progress Note Details Patient Name: Date of Service: [name] [name], A [name] 08/19/2023 9:30 A M Medical Record Number: [id number] Patient Account Number: [id number] Date of Birth/Sex: Treating RN: 1951-06-01 (72 y.o. M) Primary Care Provider: Abbe [name] Other Clinician: Referring Provider: Treating Provider/Extender: [name] in Treatment: 24 Subjective Chief Complaint Information obtained from Patient Bilateral T Ulcers 03/02/2023: right great toe ulcer with osteomyelitis oe History of Present Illness (HPI) 09/26/2020 on evaluation today patient appears to be doing somewhat poorly in regard to his bilateral feet at this point due to issues that he is been having that developed about 2 weeks ago. He was walking home which was about a mile and states that he first noted the areas on the right foot beginning. These were blisters that develop. The left started Thanksgiving day when he was up standing more cooking. He did see his primary care provider who referred him to podiatry. He saw Dr. Allena [name] and Dr. Allena [name] recommended Betadine moistened gauze dressings to the wounds and to follow-up in 2 weeks. With that being said the patient subsequently was concerned about infection 2 to 3 days  later as was his family member who is with him today. I believe this was a sister. With that being said subsequently he ended up going to urgent care at that point he was given Diflucan and Keflex he is done with both at this time. He is could be canceling the appointment with Dr. Allena [name] they tell me at this point. His most recent hemoglobin A1c was 6.6 on November 12. His ABIs were 1.2 on the right and 1.03 on the left and appear to be doing excellent. With that being said he does not have diabetic shoes and I feel like the shoes were likely the culprit for what led to this issue currently. There does not appear to be any signs of systemic infection nor local infection at this point which is great news. The patient does have a history of hypertension as well as potentially a recurrence of prostate cancer he is being followed by the cancer center for that at this point 10/03/2020 upon evaluation today patient appears to be doing better in regard to his toe ulcers. He has been tolerating the dressing changes without complication. Fortunately there is no signs of active infection at this time. The right great toe and fifth toe are the 2 worst out of everything here he has a couple that have healed on the left foot there is really nothing remaining open though he has several areas of callus on the left foot in fact 3 on the third fourth and fifth toes. That is going need to be removed today as well. 10/10/2020 upon evaluation today patient appears to be doing well in regard to his wounds. He still has wounds open on the first, second, and fifth toes of the right foot. Left foot is completely healed and the right fourth  [name], [name] ([id number]) [id number] Page 1 of 11 Visit Report for 08/19/2023 Chief Complaint Document Details Patient Name: Date of Service: [name] 08/19/2023 9:30 A M Medical Record Number: [id number] Patient Account Number: [id number] Date of Birth/Sex: Treating RN: Mar 18, 1951 (72 y.o. M) Primary Care Provider: Abbe [name] Other Clinician: Referring Provider: Treating Provider/Extender: [name] in Treatment: 24 Information Obtained from: Patient Chief Complaint Bilateral T Ulcers oe 03/02/2023: right great toe ulcer with osteomyelitis Electronic Signature(s) Signed: 08/19/2023 10:15:16 AM By: [name] Guess MD FACS Entered By: [name] on 08/19/2023 07:15:16 -------------------------------------------------------------------------------- Debridement Details Patient Name: Date of Service: [name] [name], A [name] 08/19/2023 9:30 A M Medical Record Number: [id number] Patient Account Number: [id number] Date of Birth/Sex: Treating RN: October 11, 1951 (72 y.o. [name] Primary Care Provider: Abbe [name] Other Clinician: Referring Provider: Treating Provider/Extender: [name]: 24 Debridement Performed for Assessment: Wound #8 Right T Great oe Performed By: Physician [name] Guess, MD The following information was scribed by: Samuella Bruin The information was scribed for: [name] Debridement Type: Debridement Severity of Tissue Pre Debridement: Fat layer exposed Level of Consciousness (Pre-procedure): Awake and Alert Pre-procedure Verification/Time Out Yes - 09:42 Taken: Start Time: 09:42 Percent of Wound Bed Debrided: 100% T Area Debrided (cm): otal 1.88 Tissue and other material debrided: Non-Viable, Callus, Slough, Subcutaneous, Slough Level: Skin/Subcutaneous Tissue Debridement Description: Excisional Instrument:  Curette Bleeding: Minimum Hemostasis Achieved: Pressure Response to Treatment: Procedure was tolerated well Level of Consciousness (Post- Awake and Alert procedure): Post Debridement Measurements of Total Wound Length: (cm) 2.4 Width: (cm) 1 Depth: (cm) 0.2 Volume: (cm) 0.377 Character of Wound/Ulcer Post Debridement: Improved Agnes, [name] ([id number]) [id number] Page 2 of 11 Severity of Tissue Post Debridement: Fat layer exposed Post Procedure Diagnosis Same as Pre-procedure Electronic Signature(s) Signed: 08/19/2023 11:18:33 AM By: [name] Guess MD FACS Signed: 08/19/2023 4:15:04 PM By: Gelene Mink By: Samuella Bruin on 08/19/2023 06:48:34 -------------------------------------------------------------------------------- HPI Details Patient Name: Date of Service: [name], A [name] 08/19/2023 9:30 A M Medical Record Number: [id number] Patient Account Number: [id number] Date of Birth/Sex: Treating RN: [birth year] (72 y.o. M) Primary Care Provider: Abbe [name] Other Clinician: Referring Provider: Treating Provider/Extender: [name] in Treatment: 24 History of Present Illness HPI Description: 09/26/2020 on evaluation today patient appears to be doing somewhat poorly in regard to his bilateral feet at this point due to issues that he is been having that developed about 2 weeks ago. He was walking home which was about a mile and states that he first noted the areas on the right foot beginning. These were blisters that develop. The left started Thanksgiving day when he was up standing more cooking. He did see his primary care provider who referred him to podiatry. He saw Dr. Allena [name] and Dr. Allena [name] recommended Betadine moistened gauze dressings to the wounds and to follow-up in 2 weeks. With that being said the patient subsequently was concerned about infection 2 to 3 days later as was his family member who  is with him today. I believe this was a sister. With that being said subsequently he ended up going to urgent care at that point he was given Diflucan and Keflex he is done with both at this time. He is could be canceling the appointment with Dr. Allena [name] they tell me at this point. His most recent hemoglobin A1c was 6.6 on November 12. His ABIs were 1.2 on the right and 1.03 on the left and  03/02/2023 No Yes M86.171 Other acute osteomyelitis, right ankle  and foot 03/02/2023 No Yes E11.621 Type 2 diabetes mellitus with foot ulcer 03/02/2023 No Yes I10 Essential (primary) hypertension 03/02/2023 No Yes N18.32 Chronic kidney disease, stage 3b 03/02/2023 No Yes [name] ([id number]) [id number] Page 6 of 11 Inactive Problems Resolved Problems ICD-10 Code Description Active Date Resolved Date L97.512 Non-pressure chronic ulcer of other part of right foot with fat layer exposed 03/09/2023 03/09/2023 Electronic Signature(s) Signed: 08/19/2023 10:14:26 AM By: [name] Guess MD FACS Entered By: [name] on 08/19/2023 07:14:26 -------------------------------------------------------------------------------- Progress Note Details Patient Name: Date of Service: [name] [name], A [name] 08/19/2023 9:30 A M Medical Record Number: [id number] Patient Account Number: [id number] Date of Birth/Sex: Treating RN: 1951-06-01 (72 y.o. M) Primary Care Provider: Abbe [name] Other Clinician: Referring Provider: Treating Provider/Extender: [name] in Treatment: 24 Subjective Chief Complaint Information obtained from Patient Bilateral T Ulcers 03/02/2023: right great toe ulcer with osteomyelitis oe History of Present Illness (HPI) 09/26/2020 on evaluation today patient appears to be doing somewhat poorly in regard to his bilateral feet at this point due to issues that he is been having that developed about 2 weeks ago. He was walking home which was about a mile and states that he first noted the areas on the right foot beginning. These were blisters that develop. The left started Thanksgiving day when he was up standing more cooking. He did see his primary care provider who referred him to podiatry. He saw Dr. Allena [name] and Dr. Allena [name] recommended Betadine moistened gauze dressings to the wounds and to follow-up in 2 weeks. With that being said the patient subsequently was concerned about infection 2 to 3 days  later as was his family member who is with him today. I believe this was a sister. With that being said subsequently he ended up going to urgent care at that point he was given Diflucan and Keflex he is done with both at this time. He is could be canceling the appointment with Dr. Allena [name] they tell me at this point. His most recent hemoglobin A1c was 6.6 on November 12. His ABIs were 1.2 on the right and 1.03 on the left and appear to be doing excellent. With that being said he does not have diabetic shoes and I feel like the shoes were likely the culprit for what led to this issue currently. There does not appear to be any signs of systemic infection nor local infection at this point which is great news. The patient does have a history of hypertension as well as potentially a recurrence of prostate cancer he is being followed by the cancer center for that at this point 10/03/2020 upon evaluation today patient appears to be doing better in regard to his toe ulcers. He has been tolerating the dressing changes without complication. Fortunately there is no signs of active infection at this time. The right great toe and fifth toe are the 2 worst out of everything here he has a couple that have healed on the left foot there is really nothing remaining open though he has several areas of callus on the left foot in fact 3 on the third fourth and fifth toes. That is going need to be removed today as well. 10/10/2020 upon evaluation today patient appears to be doing well in regard to his wounds. He still has wounds open on the first, second, and fifth toes of the right foot. Left foot is completely healed and the right fourth

## 2023-08-26 ENCOUNTER — Ambulatory Visit (INDEPENDENT_AMBULATORY_CARE_PROVIDER_SITE_OTHER): Payer: Medicare HMO

## 2023-08-26 DIAGNOSIS — Z794 Long term (current) use of insulin: Secondary | ICD-10-CM

## 2023-08-26 DIAGNOSIS — E1142 Type 2 diabetes mellitus with diabetic polyneuropathy: Secondary | ICD-10-CM

## 2023-08-26 MED ORDER — ONETOUCH DELICA PLUS LANCET30G MISC: 3 refills | Status: AC

## 2023-08-26 MED ORDER — ONETOUCH ULTRA TEST VI STRP: ORAL_STRIP | 12 refills | Status: AC

## 2023-08-26 MED ORDER — ONETOUCH ULTRA 2 W/DEVICE KIT: PACK | 0 refills | Status: AC

## 2023-08-26 NOTE — Progress Notes (Signed)
08/26/2023 Name: [name] MRN: [id number] DOB: [birth year]  Chief Complaint  Patient presents with   Medication Management   Diabetes   Hypertension    [name] is a 72 y.o. year old male who was referred for medication management by their primary care provider, Deeann Saint, MD. They presented for a face to face visit today.   They were referred to the pharmacist by their PCP for assistance in managing diabetes, hypertension, and complex medication management    Subjective:  Care Team: Primary Care Provider: Deeann Saint, MD ; Next Scheduled Visit: 09/02/23  Medication Access/Adherence  Current Pharmacy:  [id number] - [place], [place] - [address] [place] [address] Phone: [phone] Fax: [phone]  [facility] [place] (New Address) - [place], [place] - [address] AT Previously: [place] [address], [place] [address] [facility] [address] 4th Floor [address] [employer] [address] [employer] [place] [address] Phone: [phone] Fax: [phone]  [facility] - [place], [place] - [address] [place] [address] Phone: [phone] Fax: [phone]   Patient reports affordability concerns with their medications: No  Patient reports access/transportation concerns to their pharmacy: No  Patient reports adherence concerns with their medications:  Yes  - does not like the way the BP meds make him feel so he will go multiple days without taking his medications   Diabetes:  Current medications: Novolin Kwikpen 20 units qam and 24 units qpm Medications tried in the past: [place] (pt not sure he actually took the medication)  Current glucose readings: None - not checking, states he needs a new meter   Patient denies hypoglycemic s/sx including dizziness, shakiness, sweating. Patient denies hyperglycemic symptoms including polyuria, polydipsia,  polyphagia, nocturia, neuropathy, blurred vision.   Current medication access support: None  Hypertension:  Current medications: Amlodipine 10mg , Carvedilol 25mg  BID, Doxazosin 8mg , Hydralazine 50mg  BID Medications previously tried: Diltiazme, Metoprolol, Losartan, Lisinopril, Triamterene/HCTZ  Patient has a validated, automated, upper arm home BP cuff Current blood pressure readings readings: checking daily, 202/100 yesterday did not take any meds. BP ranges from 92-135/60-70s when he takes all of his medications. On days that he skips it jumps up into upper 150s and goes as high as 200+ for SBP. HR ranging from 67-92  Patient reports hypotensive s/sx including dizziness, lightheadedness.  Patient denies hypertensive symptoms including headache, chest pain, shortness of breath   Objective:  Lab Results  Component Value Date   HGBA1C 7.9 (H) 11/26/2022    Lab Results  Component Value Date   CREATININE 2.14 (H) 02/08/2023   BUN 34 (H) 02/08/2023   NA 142 02/08/2023   K 3.9 02/08/2023   CL 107 02/08/2023   CO2 25 02/08/2023    Lab Results  Component Value Date   CHOL 214 (H) 01/23/2023   HDL 52 01/23/2023   LDLCALC 143 (H) 01/23/2023   LDLDIRECT 98 09/04/2022   TRIG 97 01/23/2023   CHOLHDL 4.1 01/23/2023    Medications Reviewed Today     Reviewed by Sherrill Raring, RPH (Pharmacist) on 08/26/23 at 1115  Med List Status: <None>   Medication Order Taking? Sig Documenting Provider Last Dose Status Informant  amLODipine (NORVASC) 10 MG tablet [id number] Yes TAKE 1 TABLET BY MOUTH EVERY DAY Deeann Saint, MD Taking Active   amoxicillin-clavulanate (AUGMENTIN) 875-125 MG tablet [id number]  Take 1 tablet by mouth 2 (two) times daily. [address], [place], [place]  Active   aspirin EC 81 MG tablet [id number] Yes Take 1 tablet (81 mg total) by mouth daily. Swallow whole. Deeann Saint, MD Taking Active   atorvastatin (LIPITOR) 80 MG tablet [id number] Yes Take 1 tablet (80 mg total)  by mouth daily. Deeann Saint, MD Taking Active   blood glucose meter kit and supplies [id number]  Dispense based on patient and insurance preference. Use to check glucose daily (FOR ICD-10 E10.9, E11.9). Everrett Coombe, DO  Active   carvedilol (COREG) 25 MG tablet [id number] Yes TAKE 1 TABLET BY MOUTH TWICE DAILY Alver Sorrow, NP Taking Active   Continuous Glucose Sensor (DEXCOM G7 SENSOR) MISC [id number]  Use to check blood sugars continuously. Deeann Saint, MD  Active   doxazosin (CARDURA) 8 MG tablet [id number] Yes Take 1 tablet (8 mg total) by mouth at bedtime. Deeann Saint, MD Taking Active   ferrous gluconate (FERGON) 324 MG tablet [id number] Yes Take 1 tablet (324 mg total) by mouth daily with breakfast. Deeann Saint, MD Taking Active   hydrALAZINE (APRESOLINE) 50 MG tablet [id number] Yes Take 1 tablet (50 mg total) by mouth in the morning and at bedtime. Deeann Saint, MD Taking Active   insulin isophane & regular human KwikPen (NOVOLIN 70/30 KWIKPEN) (70-30) 100 UNIT/ML KwikPen [id number] Yes Inject 20 Units into the skin daily before breakfast AND 24 Units at bedtime. Deeann Saint, MD Taking Active   Insulin Pen Needle (BD PEN NEEDLE NANO 2ND GEN) 32G X 4 MM MISC [id number]  To use with insulin pen twice a day. Deeann Saint, MD  Active   Lancet Devices (ONE TOUCH DELICA LANCING DEV) MISC [id number]  1 Device by Does not apply route as directed. Shamleffer, Konrad Dolores, MD  Active   latanoprost (XALATAN) 0.005 % ophthalmic solution [id number]  SMARTSIG:In Eye(s) [provider]  Active   OneTouch Delica Lancets 30G MISC [id number]  1 Device by Does not apply route as directed. Use to test up to 4x daily. Everrett Coombe, DO  Active   [facility] ULTRA test strip [id number]  USE UP TO 4X DAILY Everrett Coombe, DO  Active               Assessment/Plan:   Diabetes: - Currently uncontrolled - Reviewed long term cardiovascular and renal outcomes of  uncontrolled blood sugar - Reviewed goal A1c, goal fasting, and goal 2 hour post prandial glucose - Reviewed dietary modifications including low carb diet  - Recommend to check glucose BID, fasting and post-prandial -Sending in order for new meter and testing supplies to pharmacy, patient states his has stopped working     Hypertension: - Currently uncontrolled due to med non-adherence - Reviewed long term cardiovascular and renal outcomes of uncontrolled blood pressure - Reviewed appropriate blood pressure monitoring technique and reviewed goal blood pressure. Recommended to check home blood pressure and heart rate daily - Recommend to decrease Amlodipine to 5mg  daily to see if this aids patient with feeling dizzy while keeping BP controlled, hoping this will assist with adherence as well. PCP approves change, tried to contact patient to notify of change, unable to lvm, will attempt to continue to reach.     Follow Up Plan: PCP in 1 week, then 12/6  Sherrill Raring, PharmD Clinical Pharmacist [phone]

## 2023-08-31 ENCOUNTER — Ambulatory Visit: Payer: Medicare HMO | Admitting: Endocrinology

## 2023-08-31 ENCOUNTER — Encounter: Payer: Self-pay | Admitting: Endocrinology

## 2023-08-31 VITALS — BP 130/90 | HR 73 | Resp 20 | Ht 66.0 in | Wt 160.6 lb

## 2023-08-31 DIAGNOSIS — E1142 Type 2 diabetes mellitus with diabetic polyneuropathy: Secondary | ICD-10-CM

## 2023-08-31 DIAGNOSIS — Z794 Long term (current) use of insulin: Secondary | ICD-10-CM | POA: Diagnosis not present

## 2023-08-31 LAB — POCT GLYCOSYLATED HEMOGLOBIN (HGB A1C): Hemoglobin A1C: 7.4 % — AB (ref 4.0–5.6)

## 2023-08-31 NOTE — Progress Notes (Signed)
Outpatient Endocrinology Note [place] Vernessa Likes, MD  08/31/23  Patient's Name: [name]    DOB: [birth year]    MRN: [id number]                                                    REASON OF VISIT: New consult of type 2 diabetes mellitus  REFERRING PROVIDER: Deeann Saint, MD  PCP: Deeann Saint, MD  HISTORY OF PRESENT ILLNESS:   Aiden Helzer is a 72 y.o. old male with past medical history listed below, is here for new consult / follow up for type 2 diabetes mellitus.   Patient was last seen by Dr. Lonzo Cloud in May 2021, was following with primary care provider and referred back to endocrinology due to uncontrolled type 2 diabetes mellitus.  Pertinent Diabetes History: Patient was diagnosed with type 2 diabetes mellitus in 2017.  Lately patient had uncontrolled type 2 diabetes mellitus with hemoglobin A1c of 7.9%.  Hemoglobin A1c improved to 7.4% today.  Chronic Diabetes Complications : Retinopathy: yes. Last ophthalmology exam was done on ?, following with ophthalmology regularly.  Nephropathy: CKD IIIb, patient reports he had seen nephrology in the past, currently not regularly following. Peripheral neuropathy: yes Coronary artery disease: on Stroke: no  Relevant comorbidities and cardiovascular risk factors: Obesity: no Body mass index is 25.92 kg/m.  Hypertension: Yes  Hyperlipidemia : Yes, on statin   Current / Home Diabetic regimen includes: Novolin 70/30: 22 units with breakfast and 20 units with supper.   Prior diabetic medications: Patient had been on metformin in the past stopped due to GI intolerance.  Patient used to be on Basaglar in the past, was switched to Humalog mix in 2020.  Glycemic data:   He did not bring glucometer in the clinic today.  Patient reports his glucometer broke and he is getting new prescription.  By recall his blood sugar in the range of 145 - 185, sometimes 160. Generally no low < 100, one time 70.    He had used CGM/freestyle  libre, stopped due to high cost.  Hypoglycemia: Patient has denied hypoglycemic episodes. Patient has hypoglycemia awareness.  Factors modifying glucose control: 1.  Diabetic diet assessment: 3 meals a day.  2.  Staying active or exercising: No formal exercise.  3.  Medication compliance: compliant all of the time.  Interval history  Patient presented to reestablish diabetes care.  Hemoglobin A1c today 7.4%.  Patient complains of occasional blurred vision, has a plan to follow-up with ophthalmology.  He denies numbness and tingling of the feet.  No other complaints today.  REVIEW OF SYSTEMS As per history of present illness.   PAST MEDICAL HISTORY: Past Medical History:  Diagnosis Date   CKD (chronic kidney disease)    Diabetes mellitus without complication (HCC)    Hyperlipidemia    Hypertension    Insomnia    Prostate cancer (HCC)     PAST SURGICAL HISTORY: Past Surgical History:  Procedure Laterality Date   PROSTATE BIOPSY      ALLERGIES: No Known Allergies  FAMILY HISTORY:  Family History  Problem Relation Age of Onset   Hypertension Mother    Colon cancer Mother    Diabetes Brother    Hypertension Daughter    Breast cancer Neg Hx    Pancreatic cancer Neg Hx    Prostate  cancer Neg Hx     SOCIAL HISTORY: Social History   Socioeconomic History   Marital status: Single    Spouse name: Not on file   Number of children: 2   Years of education: Not on file   Highest education level: Not on file  Occupational History    Comment: retired  Tobacco Use   Smoking status: Never   Smokeless tobacco: Never  Vaping Use   Vaping status: Never Used  Substance and Sexual Activity   Alcohol use: No   Drug use: No   Sexual activity: Yes  Other Topics Concern   Not on file  Social History Narrative   Not on file   Social Determinants of Health   Financial Resource Strain: Low Risk  (09/04/2022)   Overall Financial Resource Strain (CARDIA)    Difficulty of  Paying Living Expenses: Not very hard  Food Insecurity: No Food Insecurity (01/23/2023)   Hunger Vital Sign    Worried About Running Out of Food in the Last Year: Never true    Ran Out of Food in the Last Year: Never true  Transportation Needs: No Transportation Needs (01/23/2023)   PRAPARE - Administrator, Civil Service (Medical): No    Lack of Transportation (Non-Medical): No  Physical Activity: Inactive (05/05/2022)   Exercise Vital Sign    Days of Exercise per Week: 0 days    Minutes of Exercise per Session: 0 min  Stress: No Stress Concern Present (05/05/2022)   [employer] of Occupational Health - Occupational Stress Questionnaire    Feeling of Stress : Not at all  Social Connections: Socially Isolated (04/17/2021)   Social Connection and Isolation Panel [NHANES]    Frequency of Communication with Friends and Family: Twice a week    Frequency of Social Gatherings with Friends and Family: Twice a week    Attends Religious Services: Never    Database administrator or Organizations: No    Attends Engineer, structural: Never    Marital Status: Divorced    MEDICATIONS:  Current Outpatient Medications  Medication Sig Dispense Refill   amLODipine (NORVASC) 10 MG tablet TAKE 1 TABLET BY MOUTH EVERY DAY 90 tablet 1   aspirin EC 81 MG tablet Take 1 tablet (81 mg total) by mouth daily. Swallow whole. 90 tablet 3   atorvastatin (LIPITOR) 80 MG tablet Take 1 tablet (80 mg total) by mouth daily. 90 tablet 3   blood glucose meter kit and supplies Dispense based on patient and insurance preference. Use to check glucose daily (FOR ICD-10 E10.9, E11.9). 1 each 0   Blood Glucose Monitoring Suppl (ONE TOUCH ULTRA 2) w/Device KIT Use to check blood sugars twice daily as directed. Dx E11.9 1 kit 0   carvedilol (COREG) 25 MG tablet TAKE 1 TABLET BY MOUTH TWICE DAILY 60 tablet 0   doxazosin (CARDURA) 8 MG tablet Take 1 tablet (8 mg total) by mouth at bedtime. 90 tablet 3    ferrous gluconate (FERGON) 324 MG tablet Take 1 tablet (324 mg total) by mouth daily with breakfast. 90 tablet 3   glucose blood (ONETOUCH ULTRA TEST) test strip Use to check blood sugar twice daily as directed E11.9 100 each 12   hydrALAZINE (APRESOLINE) 50 MG tablet Take 1 tablet (50 mg total) by mouth in the morning and at bedtime. 180 tablet 3   insulin isophane & regular human KwikPen (NOVOLIN 70/30 KWIKPEN) (70-30) 100 UNIT/ML KwikPen Inject 20 Units into the  skin daily before breakfast AND 24 Units at bedtime. 45 mL 3   Insulin Pen Needle (BD PEN NEEDLE NANO 2ND GEN) 32G X 4 MM MISC To use with insulin pen twice a day. 200 each 3   Lancet Devices (ONE TOUCH DELICA LANCING DEV) MISC 1 Device by Does not apply route as directed. 1 each 0   Lancets (ONETOUCH DELICA PLUS LANCET30G) MISC Use 1 each to test sugars twice daily as directed E11.9 100 each 3   latanoprost (XALATAN) 0.005 % ophthalmic solution SMARTSIG:In Eye(s)     amoxicillin-clavulanate (AUGMENTIN) 875-125 MG tablet Take 1 tablet by mouth 2 (two) times daily. (Patient not taking: Reported on 08/31/2023) 20 tablet 0   Continuous Glucose Sensor (DEXCOM G7 SENSOR) MISC Use to check blood sugars continuously. (Patient not taking: Reported on 08/31/2023) 9 each 3   No current facility-administered medications for this visit.    PHYSICAL EXAM: Vitals:   08/31/23 1342  BP: (!) 130/90  Pulse: 73  Resp: 20  SpO2: 97%  Weight: 160 lb 9.6 oz (72.8 kg)  Height: 5\' 6"  (1.676 m)   Body mass index is 25.92 kg/m.  Wt Readings from Last 3 Encounters:  08/31/23 160 lb 9.6 oz (72.8 kg)  07/03/23 151 lb (68.5 kg)  02/08/23 154 lb 5.2 oz (70 kg)    General: Well developed, well nourished male in no apparent distress.  HEENT: AT/, no external lesions.  Eyes: Conjunctiva clear and no icterus. Neck: Neck supple  Lungs: Respirations not labored Neurologic: Alert, oriented, normal speech Extremities / Skin: Dry.   Psychiatric: Does not  appear depressed or anxious  Diabetic Foot Exam - Simple   No data filed    LABS Reviewed Lab Results  Component Value Date   HGBA1C 7.4 (A) 08/31/2023   HGBA1C 7.9 (H) 11/26/2022   HGBA1C 7.9 (A) 11/26/2022   Lab Results  Component Value Date   FRUCTOSAMINE 308 (H) 09/07/2020   Lab Results  Component Value Date   CHOL 214 (H) 01/23/2023   HDL 52 01/23/2023   LDLCALC 143 (H) 01/23/2023   LDLDIRECT 98 09/04/2022   TRIG 97 01/23/2023   CHOLHDL 4.1 01/23/2023   Lab Results  Component Value Date   MICRALBCREAT 134.3 (H) 04/10/2022   MICRALBCREAT 7.1 11/10/2018   Lab Results  Component Value Date   CREATININE 2.14 (H) 02/08/2023   Lab Results  Component Value Date   GFR 32.61 (L) 11/26/2022    ASSESSMENT / PLAN  1. Type 2 diabetes mellitus with diabetic polyneuropathy, without long-term current use of insulin (HCC)     Diabetes Mellitus type 2, complicated by diabetic neuropathy. - Diabetic status / severity: Uncontrolled, improving.  Lab Results  Component Value Date   HGBA1C 7.4 (A) 08/31/2023    - Hemoglobin A1c goal : <7%  Patient has uncontrolled diabetes mellitus however improving.  No glucose data to review.  Discussed about diet control with portion control and limiting carbohydrate intake.  He reports complaints of occasional dizziness and lowest blood sugar was 75, in this context I would not want to increase the dose of insulin.  - Medications: No change.  I) Novolin 70/30 insulin 22 units with breakfast and 20 units with supper.  Consider SGLT2 inhibitor in the future if cost is not a problem.  - Home glucose testing: Before meals and at bedtime.  Asked to bring glucometer in the clinic visit.  - Discussed/ Gave Hypoglycemia treatment plan.  # Consult : not required  at this time.   # Annual urine for microalbuminuria/ creatinine ratio, no microalbuminuria currently, will check today.  Patient reports he had seen nephrology in the past,  not regularly following.  Discussed about referral back to nephrology due to CKD.  Patient wants to talk with primary care provider. Will check BMP today.  Last  Lab Results  Component Value Date   MICRALBCREAT 134.3 (H) 04/10/2022    # Foot check nightly / neuropathy.  # Annual dilated diabetic eye exams.   - Diet: Make healthy diabetic food choices - Life style / activity / exercise: Discussed.  2. Blood pressure  -  BP Readings from Last 1 Encounters:  08/31/23 (!) 130/90    - Control is in target.  - No change in current plans.  3. Lipid status / Hyperlipidemia - Last  Lab Results  Component Value Date   LDLCALC 143 (H) 01/23/2023   - Continue atorvastatin 80 mg daily.  Managed by primary care provider.  Diagnoses and all orders for this visit:  Type 2 diabetes mellitus with diabetic polyneuropathy, without long-term current use of insulin (HCC) -     POCT glycosylated hemoglobin (Hb A1C) -     Microalbumin / creatinine urine ratio; Future -     Basic metabolic panel; Future -     Basic metabolic panel -     Microalbumin / creatinine urine ratio    DISPOSITION Follow up in clinic in 3 months suggested.   All questions answered and patient verbalized understanding of the plan.  [place] Yuchen Fedor, MD [facility] [address] [place], [address] [place], [place] [address] Phone # [phone]  At least part of this note was generated using voice recognition software. Inadvertent word errors may have occurred, which were not recognized during the proofreading process.

## 2023-08-31 NOTE — Patient Instructions (Signed)
No change on insulin.   Bring glucometer in follow up visit.

## 2023-09-01 LAB — BASIC METABOLIC PANEL
BUN: 33 mg/dL — ABNORMAL HIGH (ref 6–23)
CO2: 30 meq/L (ref 19–32)
Calcium: 9.3 mg/dL (ref 8.4–10.5)
Chloride: 102 meq/L (ref 96–112)
Creatinine, Ser: 2.49 mg/dL — ABNORMAL HIGH (ref 0.40–1.50)
GFR: 25.39 mL/min — ABNORMAL LOW (ref >60.00)
Glucose, Bld: 284 mg/dL — ABNORMAL HIGH (ref 70–99)
Potassium: 5 meq/L (ref 3.5–5.1)
Sodium: 138 meq/L (ref 135–145)

## 2023-09-01 LAB — MICROALBUMIN / CREATININE URINE RATIO
Creatinine,U: 225.9 mg/dL
Microalb Creat Ratio: 78.6 mg/g — ABNORMAL HIGH (ref 0.0–30.0)
Microalb, Ur: 177.4 mg/dL — ABNORMAL HIGH (ref 0.0–1.9)

## 2023-09-02 ENCOUNTER — Ambulatory Visit: Payer: Medicare HMO | Admitting: Family Medicine

## 2023-09-02 ENCOUNTER — Encounter: Payer: Self-pay | Admitting: Family Medicine

## 2023-09-02 ENCOUNTER — Encounter (HOSPITAL_BASED_OUTPATIENT_CLINIC_OR_DEPARTMENT_OTHER): Payer: Medicare HMO | Attending: General Surgery | Admitting: General Surgery

## 2023-09-02 VITALS — BP 164/90 | HR 86 | Temp 98.4°F | Ht 66.0 in | Wt 162.4 lb

## 2023-09-02 DIAGNOSIS — E11621 Type 2 diabetes mellitus with foot ulcer: Secondary | ICD-10-CM | POA: Diagnosis not present

## 2023-09-02 DIAGNOSIS — E1169 Type 2 diabetes mellitus with other specified complication: Secondary | ICD-10-CM | POA: Insufficient documentation

## 2023-09-02 DIAGNOSIS — I129 Hypertensive chronic kidney disease with stage 1 through stage 4 chronic kidney disease, or unspecified chronic kidney disease: Secondary | ICD-10-CM | POA: Diagnosis not present

## 2023-09-02 DIAGNOSIS — N1832 Chronic kidney disease, stage 3b: Secondary | ICD-10-CM | POA: Insufficient documentation

## 2023-09-02 DIAGNOSIS — L97512 Non-pressure chronic ulcer of other part of right foot with fat layer exposed: Secondary | ICD-10-CM | POA: Diagnosis not present

## 2023-09-02 DIAGNOSIS — E1122 Type 2 diabetes mellitus with diabetic chronic kidney disease: Secondary | ICD-10-CM | POA: Diagnosis not present

## 2023-09-02 DIAGNOSIS — E1142 Type 2 diabetes mellitus with diabetic polyneuropathy: Secondary | ICD-10-CM

## 2023-09-02 DIAGNOSIS — M86171 Other acute osteomyelitis, right ankle and foot: Secondary | ICD-10-CM | POA: Diagnosis not present

## 2023-09-02 DIAGNOSIS — L97514 Non-pressure chronic ulcer of other part of right foot with necrosis of bone: Secondary | ICD-10-CM | POA: Insufficient documentation

## 2023-09-02 DIAGNOSIS — I10 Essential (primary) hypertension: Secondary | ICD-10-CM

## 2023-09-02 DIAGNOSIS — N184 Chronic kidney disease, stage 4 (severe): Secondary | ICD-10-CM | POA: Diagnosis not present

## 2023-09-02 NOTE — Progress Notes (Signed)
MIRALLES, [name] ([id number]) [id number] Page 1 of 13 Visit Report for 09/02/2023 Chief Complaint Document Details Patient Name: Date of Service: [name] 09/02/2023 11:15 [name] M Medical Record Number: [id number] Patient Account Number: [id number] Date of Birth/Sex: Treating RN: 15-Sep-1951 (72 y.o. M) Primary Care Provider: [name] Other Clinician: Referring Provider: Treating Provider/Extender: [name] in Treatment: 26 Information Obtained from: Patient Chief Complaint Bilateral T Ulcers oe 03/02/2023: right great toe ulcer with osteomyelitis Electronic Signature(s) Signed: 09/02/2023 11:55:33 AM By: [name] MD FACS Entered By: [name] on 09/02/2023 08:55:33 -------------------------------------------------------------------------------- Debridement Details Patient Name: Date of Service: [name] [name], [name] 09/02/2023 11:15 [name] M Medical Record Number: [id number] Patient Account Number: [id number] Date of Birth/Sex: Treating RN: 24-Dec-1950 (72 y.o. [name] Primary Care Provider: [name] Other Clinician: Referring Provider: Treating Provider/Extender: [name] in Treatment: 26 Debridement Performed for Assessment: Wound #8 Right T Great oe Performed By: Physician [name], MD The following information was scribed by: [name] The information was scribed for: [name] Debridement Type: Debridement Severity of Tissue Pre Debridement: Fat layer exposed Level of Consciousness (Pre-procedure): Awake and Alert Pre-procedure Verification/Time Out Yes - 11:25 Taken: Start Time: 11:27 Percent of Wound Bed Debrided: 100% T Area Debrided (cm): otal 0.71 Tissue and other material debrided: Non-Viable, Callus, Skin: Epidermis Level: Skin/Epidermis Debridement Description: Selective/Open Wound Instrument: Curette, Forceps,  Scissors Bleeding: Minimum Hemostasis Achieved: Pressure Procedural Pain: 0 Post Procedural Pain: 0 Response to Treatment: Procedure was tolerated well Level of Consciousness (Post- Awake and Alert procedure): Post Debridement Measurements of Total Wound Length: (cm) 0.9 Width: (cm) 1 Depth: (cm) 0.1 Doby, Markon ([id number]) [id number] Page 2 of 13 Volume: (cm) 0.071 Character of Wound/Ulcer Post Debridement: Improved Severity of Tissue Post Debridement: Fat layer exposed Post Procedure Diagnosis Same as Pre-procedure Electronic Signature(s) Signed: 09/02/2023 12:00:21 PM By: [name] MD FACS Signed: 09/02/2023 4:26:50 PM By: [name] RN, BSN Entered By: [name] on 09/02/2023 08:32:50 -------------------------------------------------------------------------------- Debridement Details Patient Name: Date of Service: [name] [name], [name] 09/02/2023 11:15 [name] M Medical Record Number: [id number] Patient Account Number: [id number] Date of Birth/Sex: Treating RN: 12/18/1950 (72 y.o. [name] Primary Care Provider: [name] Other Clinician: Referring Provider: Treating Provider/Extender: [name] in Treatment: 26 Debridement Performed for Assessment: Wound #10 Right T Second oe Performed By: Physician [name], MD The following information was scribed by: [name] The information was scribed for: [name] Debridement Type: Debridement Severity of Tissue Pre Debridement: Fat layer exposed Level of Consciousness (Pre-procedure): Awake and Alert Pre-procedure Verification/Time Out Yes - 11:25 Taken: Start Time: 11:27 Percent of Wound Bed Debrided: 100% T Area Debrided (cm): otal 0.75 Tissue and other material debrided: Non-Viable, Skin: Epidermis Level: Skin/Epidermis Debridement Description: Selective/Open Wound Instrument: Forceps, Scissors Bleeding:  Minimum Hemostasis Achieved: Pressure Procedural Pain: 0 Post Procedural Pain: 0 Response to Treatment: Procedure was tolerated well Level of Consciousness (Post- Awake and Alert procedure): Post Debridement Measurements of Total Wound Length: (cm) 1.2 Width: (cm) 0.8 Depth: (cm) 0.1 Volume: (cm) 0.075 Character of Wound/Ulcer Post Debridement: Improved Severity of Tissue Post Debridement: Fat layer exposed Post Procedure Diagnosis Same as Pre-procedure Electronic Signature(s) Signed: 09/02/2023 12:00:21 PM By: [name] MD FACS Signed: 09/02/2023 4:26:50 PM By: [name] RN, BSN Entered By: [name] on 09/02/2023 08:37:21 Houpt, [name] ([id number]) [id number] Page 3 of 13 -------------------------------------------------------------------------------- HPI Details Patient Name: Date of Service: [name], [name]  09/02/2023 11:15 [name] M Medical Record Number: [id number] Patient Account Number: [id number] Date of Birth/Sex: Treating RN: [birth year] (72 y.o. M) Primary Care Provider: [name] Other Clinician: Referring Provider: Treating Provider/Extender: [name] in Treatment: 26 History of Present Illness HPI Description: 09/26/2020 on evaluation today patient appears to be doing somewhat poorly in regard to his bilateral feet at this point due to issues that he is been having that developed about 2 weeks ago. He was walking home which was about [name] mile and states that he first noted the areas on the right foot beginning. These were blisters that develop. The left started Thanksgiving day when he was up standing more cooking. He did see his primary care provider who referred him to podiatry. He saw Dr. Allena Katz and Dr. Allena Katz recommended Betadine moistened gauze dressings to the wounds and to follow-up in 2 weeks. With that being said the patient subsequently was concerned about infection 2 to 3 days later  as was his family member who is with him today. I believe this was [name] sister. With that being said subsequently he ended up going to urgent care at that point he was given Diflucan and Keflex he is done with both at this time. He is could be canceling the appointment with Dr. Allena Katz they tell me at this point. His most recent hemoglobin A1c was 6.6 on November 12. His ABIs were 1.2 on the right and 1.03 on the left and appear to be doing excellent. With that being said he does not have diabetic shoes and I feel like the shoes were likely the culprit for what led to this issue currently. There does not appear to be any signs of systemic infection nor local infection at this point which is great news. The patient does have [name] history of hypertension as well as potentially [name] recurrence of prostate cancer he is being followed by the cancer center for that at this point 10/03/2020 upon evaluation today patient appears to be doing better in regard to his toe ulcers. He has been tolerating the dressing changes without complication. Fortunately there is no signs of active infection at this time. The right great toe and fifth toe are the 2 worst out of everything here he has [name] couple that have healed on the left foot there is really nothing remaining open though he has several areas of callus on the left foot in fact 3 on the third fourth and fifth toes. That is going need to be removed today as well. 10/10/2020 upon evaluation today patient appears to be doing well in regard to his wounds. He still has wounds open on the first, second, and fifth toes of the right foot. Left foot is completely healed and the right fourth toe is also completely healed today. Overall very pleased with how things seem to be progressing. 10/17/2020 on evaluation today patient appears to be doing much better in regard to the right first, second, and fifth toes. He is making excellent progress and to be honest I am extremely pleased with  where things stand today. Fortunately there is no signs of active infection at this time. No fevers, chills, nausea, vomiting, or diarrhea. 11/07/2020 upon evaluation today patient appears to actually be doing excellent in regard to his toe ulcers. The fifth and first toe on the right foot are what is left at this point. There does not appear to be signs of infection currently which is great news. With that being said  I did perform some sharp debridement today to remove some of the callus around the edges of the wound 11/14/2020 upon evaluation today patient appears to be doing excellent in regard to his toe ulcers. In fact he is almost completely healed he just has 1 area on the great toe remaining at this time and this is very small. Overall he is extremely pleased with where things stand as MI. 11/21/2020 upon evaluation today patient appears to be doing well with regard to his foot ulcer. In fact this appears to be completely healed on the great toe which was the last of the areas of concern. Fortunately there is no signs of active infection at this time. No fevers, chills, nausea, vomiting, or diarrhea. READMISSION 03/02/2023 This is [name] now 72 year old type II diabetic (last hemoglobin A1c 7.9%) who returns with [name] right great toe ulcer. On February 12, 2023, he was seen by podiatry who noted: "He has superficial wounds to the second and fourth toes of the right foot however the hallux does not straight [name] very large bulbous macerated mass of tissue including the nail plate the tuft of the toe. Once this was attempted to be debrided is noted that there was [name] foul odor no purulence no significant cellulitic process but bone was visible through the wound. There is gray necrotic tissue beneath the macerated cutis." Plain radiographs taken that day demonstrated breakdown of the distal phalanx, gas in the tissue of the toe. [name] stat MRI was subsequently ordered. Those results are copied here: IMPRESSION: 1.  Osteomyelitis of the distal phalanx great toe with overlying ulceration and speckled gas in the soft tissues of the great toe. 2. Mild dorsal subcutaneous edema in the forefoot, cellulitis is not excluded by imaging. 3. Accentuated T2 signal dorsally over the second and third toes, conceivably from bandaging or blistering, correlate with visual inspection. 4. Small focus of metal artifact along the plantar soft tissues of the forefoot below the third metatarsal head, not visible on recent conventional radiographs, probably [name] microscopic metallic particle. He was started on Augmentin for 10-day course. He apparently requested [name] second opinion regarding the osteomyelitis and need for amputation and saw Dr. Allena Katz on May 1. Dr. Eliane Decree note is incomplete at this time so it is not clear what was discussed. I do not see any formal vascular studies in the patient's chart. ABI in clinic today was noncompressible, but he has easily palpable distal pulses. 03/09/2023 He has [name] new ulcer that has opened up on the tip of his second toe. This is limited to the fat layer. There is [name] little bit of slough on the surface. The great toe ulcer has continued presence of necrotic muscle and subcutaneous tissue with bone exposure. He is taking Augmentin and doxycycline. According to his insurance, no preapproval is necessary for hyperbaric oxygen therapy, but they cannot guarantee it will be paid for. 03/17/2023: There is [name] bit of maceration of the skin between his great and second toes, but no frank tissue breakdown. The ulcer on the tip of his second toe is [name] little bit smaller. The great toe ulcer is cleaner. He has received approval for hyperbaric oxygen therapy. 04/02/2023: The second toe is healed. The ulcer on the tip of his great toe has hypertrophic granulation tissue. Bone is still palpable in the center. There is some accumulation of callus and senescent skin around the wound margins. We have been trying to get  in touch with him to schedule him for initiation  of hyperbaric oxygen therapy. He has been approved for Epicord, but we do not have 1 here for him today. OLGUIN, [name] ([id number]) [id number] Page 4 of 13 04/09/2023: The ulcer on the tip of his great toe has filled in with more granulation tissue. It does still probe to bone, but appears much healthier. He started hyperbaric oxygen therapy on Monday. We have been struggling [name] little bit with hypoglycemia during and after his treatments, but we are working on [name] resolution to that. We are going to apply Epicord no. 1 today. 04/17/2023: There has been substantial infill of granulation tissue. I am no longer able to probe to bone. He is doing well with his hyperbaric oxygen therapy and we have not experienced any further episodes of hypoglycemia. 04/23/2023: His wound is responding beautifully to both the skin substitute and hyperbaric oxygen therapy. Current measurements:1.4x2.4x0.1cm. There is just slight slough on the surface and bone is completely covered with [name] good layer of healthy tissue. 04/29/2023: The wound continues to improve. There is some periwound callus buildup as well as thin slough on the wound surface. Measurements L x W x D (cm) 1.4x1.8x0.1 Area (cm): 1.979 % Reduction in Area: 82.90% . % Reduction in Volume: 91.40% 05/07/2023: The wound is smaller again today. There is [name] little bit of callus accumulation around the margins with some slough on the surface. Excellent coverage of the bone with healthy-looking granulation tissue. Wound measurements: 0.6x1.2x0.1 05/14/2023: The wound is smaller again today. There is callus accumulation around the edges with [name] little bit of slough on the surface. Measurements L x W x D (cm) 0.5x1.2x0.2 Area (cm): 0.471 Volume (cm) : 0.094 % Reduction in Area: 95.90% % Reduction in Volume: 95.90% 05/21/2023: He says that he was on his feet most of Saturday and his foot got wet.  There is tissue breakdown secondary to moisture on the plantar aspect of his toe. There is gray-green slough and callus accumulation. 05/28/2023: The wound looks better today. There is still some moisture related tissue breakdown that has progressed on the plantar aspect of his toe, but I think this is residual from last week, as there is no active tissue maceration. Measurements L x W x D (cm) 0.8x1x0. Area (cm): 0.628 Volume (cm) : 0.188 % Reduction in Area: 94.60% % Reduction in Volume: 91.90% 06/05/2023: The wound has improved considerably over the past week. There has been substantial epithelialization. There is some callus accumulation around the edges and light slough on the surface. 06/12/2023: The wound continues to improve. It is down to less than half [name] centimeter in greatest dimension and is completely flush with the surrounding tissue. There is some dry skin and callus accumulation around the edges with light slough on the surface. Measurements L x W x D (cm) 0.3x0.5x0.1 Area (cm): 0.118 Volume (cm) : 0.012 % Reduction in Area: 99.00% % Reduction in Volume: 99.50% 06/19/2023: The wound measured the same this week, but visually it appears smaller. There is some surrounding callus and dry skin but the surface looks robust and healthy. 06/26/2023: Unfortunately, there has been massive breakdown of his wound. Although the dressing did not have any drainage on it, there was extensive fluid and blood that collected underneath the periwound callus that has resulted in substantial deterioration. The bone remains covered with soft tissue, but the wound is markedly larger. There appears to be some pressure-induced tissue injury on the weightbearing aspect of the toe. 07/06/2023: The culture that I took at his last visit  was positive for methicillin-resistant Staph aureus with tetracycline resistance. I prescribed [name] course of Bactrim and he is currently taking this. His wound is markedly improved.  No evidence of pressure-induced tissue injury. There is some callus accumulation around the wound edges and some slough on the surface. 07/13/2023: The wound continues to improve. There is [name] little bit of callus accumulation around the edges and the skin is starting to roll inward, but the surface is clean and the dimensions have decreased. 07/20/2023: The wound measured smaller again today. It is fairly superficial at this point. It is [name] little bit on the dry side. 07/29/2023: The wound is smaller again this week. There is [name] little bit of callus accumulation around the edges and very minimal slough on the surface. 08/05/2023: The wound continues to contract, albeit slowly. There is some callus and dry skin accumulation around the edges and some light slough on the surface. 08/19/2023: T oday, the main area of the wound is quite small and superficial. There is some discoloration, however, underneath callus near the lateral aspect of the tip of his toe. It appears that moisture has resulted in tissue breakdown and this area is open. 09/02/2023: The main area of the wound has healed. The callus near the lateral aspect of the tip of his toe has lifted and the ulcer underneath persists. He has also managed to rub of the blister on the medial aspect of his right second toe, presumably from rubbing against the great toe. Under the blister, the fat layer is exposed. Electronic Signature(s) Signed: 09/02/2023 11:56:51 AM By: [name] MD FACS Entered By: [name] on 09/02/2023 08:56:51 President, [name] ([id number]) [id number] Page 5 of 13 -------------------------------------------------------------------------------- Physical Exam Details Patient Name: Date of Service: [name] 09/02/2023 11:15 [name] M Medical Record Number: [id number] Patient Account Number: [id number] Date of Birth/Sex: Treating RN: 05-Oct-1951 (72 y.o. M) Primary Care Provider: [name] Other Clinician: Referring Provider: Treating Provider/Extender: [name]: 26 Constitutional Hypertensive, asymptomatic. . . . no acute distress. Respiratory Normal work of breathing on room air.. Notes 09/02/2023: The main area of the wound has healed. The callus near the lateral aspect of the tip of his toe has lifted and the ulcer underneath persists. He has also managed to rub of the blister on the medial aspect of his right second toe, presumably from rubbing against the great toe. Under the blister, the fat layer is exposed. Electronic Signature(s) Signed: 09/02/2023 11:58:10 AM By: [name] MD FACS Entered By: [name] on 09/02/2023 08:58:10 -------------------------------------------------------------------------------- Physician Orders Details Patient Name: Date of Service: [name], [name] 09/02/2023 11:15 [name] M Medical Record Number: [id number] Patient Account Number: [id number] Date of Birth/Sex: Treating RN: 11/15/1950 (72 y.o. [name] Primary Care Provider: [name] Other Clinician: Referring Provider: Treating Provider/Extender: [name] in Treatment: 26 Verbal / Phone Orders: No Diagnosis Coding ICD-10 Coding Code Description L97.514 Non-pressure chronic ulcer of other part of right foot with necrosis of bone L97.512 Non-pressure chronic ulcer of other part of right foot with fat layer exposed M86.171 Other acute osteomyelitis, right ankle and foot E11.621 Type 2 diabetes mellitus with foot ulcer I10 Essential (primary) hypertension N18.32 Chronic kidney disease, stage 3b Follow-up Appointments ppointment in 2 weeks. - Dr Lady Gary Rm 1 Return [name] Wed 11/20 @ 07:30 am Bathing/ Shower/ Hygiene May shower and wash wound with soap and water. 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 Cleanser: Soap and Water 1 x Per Day/30 Days Discharge Instructions: May shower and wash wound with dial antibacterial soap and water prior to dressing change. BLUITT, [name] ([id number]) [id number] Page 6 of 13 Peri-Wound Care: Sween Lotion (Moisturizing lotion) 1 x Per Day/30 Days Discharge Instructions: Apply moisturizing lotion as directed Prim Dressing: Maxorb Extra Ag+ Alginate Dressing, 4x4.75 (in/in) 1 x Per Day/30 Days ary Discharge Instructions: Apply to wound bed as instructed Secondary Dressing: Woven Gauze Sponges 2x2 in (Generic) 1 x Per Day/30 Days Discharge Instructions: Apply over primary dressing as directed. Secured With: Insurance underwriter, Sterile 2x75 (in/in) (Generic) 1 x Per Day/30 Days Discharge Instructions: Secure with stretch gauze as directed. Secured With: 78M Medipore H Soft Cloth Surgical T ape, 4 x 10 (in/yd) (Generic) 1 x Per Day/30 Days Discharge Instructions: Secure with tape as directed. Wound #8 - T Great oe Wound Laterality: Right Cleanser: Normal Saline (Generic) 1 x Per Day/30 Days Discharge Instructions: Cleanse the wound with Normal Saline prior to applying [name] clean dressing using gauze sponges, not tissue or cotton balls. Cleanser: Soap and Water 1 x Per Day/30 Days Discharge Instructions: May shower and wash wound with dial antibacterial soap and water prior to dressing change. Peri-Wound Care: Sween Lotion (Moisturizing lotion) 1 x Per Day/30 Days Discharge Instructions: Apply moisturizing lotion as directed Prim Dressing: Maxorb Extra Ag+ Alginate Dressing, 4x4.75 (in/in) 1 x Per Day/30 Days ary Discharge Instructions:  Apply to wound bed as instructed Secondary Dressing: Woven Gauze Sponges 2x2 in (Generic) 1 x Per Day/30 Days Discharge Instructions: Apply over primary dressing as directed. Secured With: Insurance underwriter, Sterile 2x75 (in/in) (Generic) 1 x Per Day/30 Days Discharge Instructions: Secure with stretch gauze as directed. Secured With: 78M Medipore H Soft Cloth Surgical T ape, 4 x 10 (in/yd) (Generic) 1 x Per Day/30 Days Discharge Instructions: Secure with tape as directed. Electronic Signature(s) Signed: 09/02/2023 12:00:21 PM By: [name] MD FACS Entered By: [name] on 09/02/2023 08:58:25 -------------------------------------------------------------------------------- Problem List Details Patient Name: Date of Service: [name], [name] 09/02/2023 11:15 [name] M Medical Record Number: [id number] Patient Account Number: [id number] Date of Birth/Sex: Treating RN: 1951-02-25 (72 y.o. [name] Primary Care Provider: [name] Other Clinician: Referring Provider: Treating Provider/Extender: [name] in Treatment: 26 Active Problems ICD-10 Encounter Code Description Active Date MDM Diagnosis L97.514 Non-pressure chronic ulcer of other part of right foot with necrosis of bone 03/02/2023 No Yes L97.512 Non-pressure chronic ulcer of other part of right foot with fat layer exposed 09/02/2023 No Yes M86.171 Other acute osteomyelitis, right ankle and foot 03/02/2023 No Yes Luallen, Ovadia ([id number]) [id number] Page 7 of 13 E11.621 Type 2 diabetes mellitus with foot ulcer 03/02/2023 No Yes I10 Essential (primary) hypertension 03/02/2023 No Yes N18.32 Chronic kidney disease, stage 3b 03/02/2023 No Yes Inactive Problems Resolved Problems ICD-10 Code Description Active Date Resolved Date L97.512 Non-pressure chronic ulcer of other part of right foot with fat layer exposed 03/09/2023 03/09/2023 Electronic  Signature(s) Signed: 09/02/2023 11:55:08 AM By: [name] MD FACS Entered By: [name] on 09/02/2023 08:55:08 -------------------------------------------------------------------------------- Progress Note Details Patient Name: Date of Service: [name]  [name], [name] 09/02/2023 11:15 [name] M Medical Record Number: [id number] Patient Account Number: [id number] Date of Birth/Sex: Treating RN: [birth year] (72 y.o. M) Primary Care Provider: [name] Other Clinician: Referring Provider: Treating Provider/Extender: [name] in Treatment: 26 Subjective Chief Complaint Information obtained from Patient Bilateral T Ulcers 03/02/2023: right great toe ulcer with osteomyelitis oe History of Present Illness (HPI) 09/26/2020 on evaluation today patient appears to be doing somewhat poorly in regard to his bilateral feet at this point due to issues that he is been having that developed about 2 weeks ago. He was walking home which was about [name] mile and states that he first noted the areas on the right foot beginning. These were blisters that develop. The left started Thanksgiving day when he was up standing more cooking. He did see his primary care provider who referred him to podiatry. He saw Dr. Allena Katz and Dr. Allena Katz recommended Betadine moistened gauze dressings to the wounds and to follow-up in 2 weeks. With that being said the patient subsequently was concerned about infection 2 to 3 days later as was his family member who is with him today. I believe this was [name] sister. With that being said subsequently he ended up going to urgent care at that point he was given Diflucan and Keflex he is done with both at this time. He is could be canceling the appointment with Dr. Allena Katz they tell me at this point. His most recent hemoglobin A1c was 6.6 on November 12. His ABIs were 1.2 on the right and 1.03 on the left and appear to be doing excellent. With that being said he does not  have diabetic shoes and I feel like the shoes were likely the culprit for what led to this issue currently. There does not appear to be any signs of systemic infection nor local infection at this point which is great news. The patient does have [name] history of hypertension as well as potentially [name] recurrence of prostate cancer he is being followed by the cancer center for that at this point 10/03/2020 upon evaluation today patient appears to be doing better in regard to his toe ulcers. He has been tolerating the dressing changes without complication. Fortunately there is no signs of active infection at this time. The right great toe and fifth toe are the 2 worst out of everything here he has [name] couple that have healed on the left foot there is really nothing remaining open though he has several areas of callus on the left foot in fact 3 on the third fourth and fifth toes. That is going need to be removed today as well. 10/10/2020 upon evaluation today patient appears to be doing well in regard to his wounds. He still has wounds open on the first, second, and fifth toes of the right foot. Left foot is completely healed and the right fourth toe is also completely healed today. Overall very pleased with how things seem to be progressing. 10/17/2020 on evaluation today patient appears to be doing much better in regard to the right first, second, and fifth toes. He is making excellent progress and to be honest I am extremely pleased with where things stand today. Fortunately there is no signs of active infection at this time. No fevers, chills, nausea, vomiting, or diarrhea. 11/07/2020 upon evaluation today patient appears to actually be doing excellent in regard to his toe ulcers. The fifth and first toe on the right foot are what is left at this  point. There does not appear to be signs of infection currently which is great news. With that being said I did perform some sharp debridement today to remove some of  the callus around the edges of the wound Frier, Kayvion ([id number]) [phone].pdf Page 8 of 13 11/14/2020 upon evaluation today patient appears to be doing excellent in regard to his toe ulcers. In fact he is almost completely healed he just has 1 area on the great toe remaining at this time and this is very small. Overall he is extremely pleased with where things stand as MI. 11/21/2020 upon evaluation today patient appears to be doing well with regard to his foot ulcer. In fact this appears to be completely healed on the great toe which was the last of the areas of concern. Fortunately there is no signs of active infection at this time. No fevers, chills, nausea, vomiting, or diarrhea. READMISSION 03/02/2023 This is [name] now 72 year old type II diabetic (last hemoglobin A1c 7.9%) who returns with [name] right great toe ulcer. On February 12, 2023, he was seen by podiatry who noted: "He has superficial wounds to the second and fourth toes of the right foot however the hallux does not straight [name] very large bulbous macerated mass of tissue including the nail plate the tuft of the toe. Once this was attempted to be debrided is noted that there was [name] foul odor no purulence no significant cellulitic process but bone was visible through the wound. There is gray necrotic tissue beneath the macerated cutis." Plain radiographs taken that day demonstrated breakdown of the distal phalanx, gas in the tissue of the toe. [name] stat MRI was subsequently ordered. Those results are copied here: IMPRESSION: 1. Osteomyelitis of the distal phalanx great toe with overlying ulceration and speckled gas in the soft tissues of the great toe. 2. Mild dorsal subcutaneous edema in the forefoot, cellulitis is not excluded by imaging. 3. Accentuated T2 signal dorsally over the second and third toes, conceivably from bandaging or blistering, correlate with visual inspection. 4. Small focus of metal artifact along  the plantar soft tissues of the forefoot below the third metatarsal head, not visible on recent conventional radiographs, probably [name] microscopic metallic particle. He was started on Augmentin for 10-day course. He apparently requested [name] second opinion regarding the osteomyelitis and need for amputation and saw Dr. Allena Katz on May 1. Dr. Eliane Decree note is incomplete at this time so it is not clear what was discussed. I do not see any formal vascular studies in the patient's chart. ABI in clinic today was noncompressible, but he has easily palpable distal pulses. 03/09/2023 He has [name] new ulcer that has opened up on the tip of his second toe. This is limited to the fat layer. There is [name] little bit of slough on the surface. The great toe ulcer has continued presence of necrotic muscle and subcutaneous tissue with bone exposure. He is taking Augmentin and doxycycline. According to his insurance, no preapproval is necessary for hyperbaric oxygen therapy, but they cannot guarantee it will be paid for. 03/17/2023: There is [name] bit of maceration of the skin between his great and second toes, but no frank tissue breakdown. The ulcer on the tip of his second toe is [name] little bit smaller. The great toe ulcer is cleaner. He has received approval for hyperbaric oxygen therapy. 04/02/2023: The second toe is healed. The ulcer on the tip of his great toe has hypertrophic granulation tissue. Bone is still palpable in the center. There  is some accumulation of callus and senescent skin around the wound margins. We have been trying to get in touch with him to schedule him for initiation of hyperbaric oxygen therapy. He has been approved for Epicord, but we do not have 1 here for him today. 04/09/2023: The ulcer on the tip of his great toe has filled in with more granulation tissue. It does still probe to bone, but appears much healthier. He started hyperbaric oxygen therapy on Monday. We have been struggling [name] little bit with  hypoglycemia during and after his treatments, but we are working on [name] resolution to that. We are going to apply Epicord no. 1 today. 04/17/2023: There has been substantial infill of granulation tissue. I am no longer able to probe to bone. He is doing well with his hyperbaric oxygen therapy and we have not experienced any further episodes of hypoglycemia. 04/23/2023: His wound is responding beautifully to both the skin substitute and hyperbaric oxygen therapy. Current measurements:1.4x2.4x0.1cm. There is just slight slough on the surface and bone is completely covered with [name] good layer of healthy tissue. 04/29/2023: The wound continues to improve. There is some periwound callus buildup as well as thin slough on the wound surface. Measurements L x W x D (cm) 1.4x1.8x0.1 Area (cm): 1.979 % Reduction in Area: 82.90% . % Reduction in Volume: 91.40% 05/07/2023: The wound is smaller again today. There is [name] little bit of callus accumulation around the margins with some slough on the surface. Excellent coverage of the bone with healthy-looking granulation tissue. Wound measurements: 0.6x1.2x0.1 05/14/2023: The wound is smaller again today. There is callus accumulation around the edges with [name] little bit of slough on the surface. Measurements L x W x D (cm) 0.5x1.2x0.2 Area (cm): 0.471 Volume (cm) : 0.094 % Reduction in Area: 95.90% % Reduction in Volume: 95.90% 05/21/2023: He says that he was on his feet most of Saturday and his foot got wet. There is tissue breakdown secondary to moisture on the plantar aspect of his toe. There is gray-green slough and callus accumulation. 05/28/2023: The wound looks better today. There is still some moisture related tissue breakdown that has progressed on the plantar aspect of his toe, but I think this is residual from last week, as there is no active tissue maceration. Measurements L x W x D (cm) 0.8x1x0. Area (cm): 0.628 Volume (cm) : 0.188 % Reduction in Area: 94.60% %  Reduction in Volume: 91.90% 06/05/2023: The wound has improved considerably over the past week. There has been substantial epithelialization. There is some callus accumulation around the edges and light slough on the surface. 06/12/2023: The wound continues to improve. It is down to less than half [name] centimeter in greatest dimension and is completely flush with the surrounding tissue. There is some dry skin and callus accumulation around the edges with light slough on the surface. Measurements L x W x D (cm) 0.3x0.5x0.1 Area (cm): 0.118 Volume (cm) : 0.012 % Reduction in Area: 99.00% % Reduction in Volume: 99.50% Rovner, Percy ([id number]) [id number] Page 9 of 13 06/19/2023: The wound measured the same this week, but visually it appears smaller. There is some surrounding callus and dry skin but the surface looks robust and healthy. 06/26/2023: Unfortunately, there has been massive breakdown of his wound. Although the dressing did not have any drainage on it, there was extensive fluid and blood that collected underneath the periwound callus that has resulted in substantial deterioration. The bone remains covered with soft tissue, but the wound  is markedly larger. There appears to be some pressure-induced tissue injury on the weightbearing aspect of the toe. 07/06/2023: The culture that I took at his last visit was positive for methicillin-resistant Staph aureus with tetracycline resistance. I prescribed [name] course of Bactrim and he is currently taking this. His wound is markedly improved. No evidence of pressure-induced tissue injury. There is some callus accumulation around the wound edges and some slough on the surface. 07/13/2023: The wound continues to improve. There is [name] little bit of callus accumulation around the edges and the skin is starting to roll inward, but the surface is clean and the dimensions have decreased. 07/20/2023: The wound measured smaller again today.  It is fairly superficial at this point. It is [name] little bit on the dry side. 07/29/2023: The wound is smaller again this week. There is [name] little bit of callus accumulation around the edges and very minimal slough on the surface. 08/05/2023: The wound continues to contract, albeit slowly. There is some callus and dry skin accumulation around the edges and some light slough on the surface. 08/19/2023: T oday, the main area of the wound is quite small and superficial. There is some discoloration, however, underneath callus near the lateral aspect of the tip of his toe. It appears that moisture has resulted in tissue breakdown and this area is open. 09/02/2023: The main area of the wound has healed. The callus near the lateral aspect of the tip of his toe has lifted and the ulcer underneath persists. He has also managed to rub of the blister on the medial aspect of his right second toe, presumably from rubbing against the great toe. Under the blister, the fat layer is exposed. Patient History Information obtained from Patient. Family History Diabetes - Mother,Siblings, No family history of Cancer, Heart Disease, Hereditary Spherocytosis, Hypertension, Kidney Disease, Lung Disease, Seizures, Stroke, Thyroid Problems, Tuberculosis. Social History Never smoker, Marital Status - Single, Alcohol Use - Never, Drug Use - No History, Caffeine Use - Rarely. Medical History Cardiovascular Patient has history of Hypertension Endocrine Patient has history of Type II Diabetes Denies history of Type I Diabetes Musculoskeletal Patient has history of Osteoarthritis Neurologic Patient has history of Neuropathy Oncologic Patient has history of Received Radiation Medical [name] Surgical History Notes nd Eyes Diabetic retinopathy Cardiovascular Carotid artery stenosis Oncologic Prostate cancer Objective Constitutional Hypertensive, asymptomatic. no acute distress. Vitals Time Taken: 11:19 AM, Height: 67 in,  Weight: 154 lbs, BMI: 24.1, Temperature: 98.4 F, Pulse: 77 bpm, Respiratory Rate: 18 breaths/min, Blood Pressure: 225/104 mmHg, Capillary Blood Glucose: 240 mg/dl. General Notes: glucose per pt report this am, notified MD of BP. Pt to see PCP tomorrow. Respiratory Normal work of breathing on room air.. General Notes: 09/02/2023: The main area of the wound has healed. The callus near the lateral aspect of the tip of his toe has lifted and the ulcer underneath persists. He has also managed to rub of the blister on the medial aspect of his right second toe, presumably from rubbing against the great toe. Under the blister, the fat layer is exposed. Integumentary (Hair, Skin) Wound #10 status is Open. Original cause of wound was Blister. The date acquired was: 09/02/2023. The wound is located on the Right T Second. The wound Levii Hairfield, Kaci ([id number]) [id number] Page 10 of 13 measures 1.2cm length x 0.8cm width x 0.1cm depth; 0.754cm^2 area and 0.075cm^3 volume. There is Fat Layer (Subcutaneous Tissue) exposed. There is no tunneling or undermining noted. There is [name] medium amount  of serous drainage noted. The wound margin is flat and intact. There is large (67-100%) red granulation within the wound bed. There is no necrotic tissue within the wound bed. The periwound skin appearance had no abnormalities noted for texture. The periwound skin appearance had no abnormalities noted for color. The periwound skin appearance exhibited: Dry/Scaly. Periwound temperature was noted as No Abnormality. Wound #8 status is Open. Original cause of wound was Gradually Appeared. The date acquired was: 02/04/2023. The wound has been in treatment 26 weeks. The wound is located on the Right T Great. The wound measures 0.1cm length x 0.1cm width x 0.1cm depth; 0.008cm^2 area and 0.001cm^3 volume. There is oe no tunneling or undermining noted. There is [name] none present amount of drainage noted.  There is no granulation within the wound bed. There is no necrotic tissue within the wound bed. The periwound skin appearance had no abnormalities noted for color. The periwound skin appearance exhibited: Callus, Dry/Scaly. The periwound skin appearance did not exhibit: Maceration. Periwound temperature was noted as No Abnormality. Assessment Active Problems ICD-10 Non-pressure chronic ulcer of other part of right foot with necrosis of bone Non-pressure chronic ulcer of other part of right foot with fat layer exposed Other acute osteomyelitis, right ankle and foot Type 2 diabetes mellitus with foot ulcer Essential (primary) hypertension Chronic kidney disease, stage 3b Procedures Wound #10 Pre-procedure diagnosis of Wound #10 is [name] Diabetic Wound/Ulcer of the Lower Extremity located on the Right T Second .Severity of Tissue Pre Debridement oe is: Fat layer exposed. There was [name] Selective/Open Wound Skin/Epidermis Debridement with [name] total area of 0.75 sq cm performed by [name], MD. With the following instrument(s): Forceps, and Scissors to remove Non-Viable tissue/material. Material removed includes Skin: Epidermis. No specimens were taken. [name] time out was conducted at 11:25, prior to the start of the procedure. [name] Minimum amount of bleeding was controlled with Pressure. The procedure was tolerated well with [name] pain level of 0 throughout and [name] pain level of 0 following the procedure. Post Debridement Measurements: 1.2cm length x 0.8cm width x 0.1cm depth; 0.075cm^3 volume. Character of Wound/Ulcer Post Debridement is improved. Severity of Tissue Post Debridement is: Fat layer exposed. Post procedure Diagnosis Wound #10: Same as Pre-Procedure Wound #8 Pre-procedure diagnosis of Wound #8 is [name] Diabetic Wound/Ulcer of the Lower Extremity located on the Right T Great .Severity of Tissue Pre Debridement is: oe Fat layer exposed. There was [name] Selective/Open Wound Skin/Epidermis Debridement with  [name] total area of 0.71 sq cm performed by [name], MD. With the following instrument(s): Curette, Forceps, and Scissors to remove Non-Viable tissue/material. Material removed includes Callus and Skin: Epidermis and. No specimens were taken. [name] time out was conducted at 11:25, prior to the start of the procedure. [name] Minimum amount of bleeding was controlled with Pressure. The procedure was tolerated well with [name] pain level of 0 throughout and [name] pain level of 0 following the procedure. Post Debridement Measurements: 0.9cm length x 1cm width x 0.1cm depth; 0.071cm^3 volume. Character of Wound/Ulcer Post Debridement is improved. Severity of Tissue Post Debridement is: Fat layer exposed. Post procedure Diagnosis Wound #8: Same as Pre-Procedure Plan Follow-up Appointments: Return Appointment in 2 weeks. - Dr Lady Gary Rm 1 Wed 11/20 @ 07:30 am Bathing/ Shower/ Hygiene: May shower and wash wound with soap and water. Off-Loading: Wound #8 Right T Great: oe Other: - Wear the [employer] front offloading sandal when walking, minimal weight bearing right foot WOUND #10: - T Second Wound  Laterality: Right oe Cleanser: Normal Saline (Generic) 1 x Per Day/30 Days Discharge Instructions: Cleanse the wound with Normal Saline prior to applying [name] clean dressing using gauze sponges, not tissue or cotton balls. Cleanser: Soap and Water 1 x Per Day/30 Days Discharge Instructions: May shower and wash wound with dial antibacterial soap and water prior to dressing change. Peri-Wound Care: Sween Lotion (Moisturizing lotion) 1 x Per Day/30 Days Discharge Instructions: Apply moisturizing lotion as directed Prim Dressing: Maxorb Extra Ag+ Alginate Dressing, 4x4.75 (in/in) 1 x Per Day/30 Days ary Discharge Instructions: Apply to wound bed as instructed Secondary Dressing: Woven Gauze Sponges 2x2 in (Generic) 1 x Per Day/30 Days Discharge Instructions: Apply over primary dressing as directed. Secured With: Teacher, early years/pre, Sterile 2x75 (in/in) (Generic) 1 x Per Day/30 Days Discharge Instructions: Secure with stretch gauze as directed. Secured With: 61M Medipore H Soft Cloth Surgical T ape, 4 x 10 (in/yd) (Generic) 1 x Per Day/30 Days Discharge Instructions: Secure with tape as directed. WOUND #8: - T Great Wound Laterality: Right oe Cleanser: Normal Saline (Generic) 1 x Per Day/30 Days Discharge Instructions: Cleanse the wound with Normal Saline prior to applying [name] clean dressing using gauze sponges, not tissue or cotton balls. Cleanser: Soap and Water 1 x Per Day/30 Days SROKA, [name] ([id number]) [id number] Page 11 of 13 Discharge Instructions: May shower and wash wound with dial antibacterial soap and water prior to dressing change. Peri-Wound Care: Sween Lotion (Moisturizing lotion) 1 x Per Day/30 Days Discharge Instructions: Apply moisturizing lotion as directed Prim Dressing: Maxorb Extra Ag+ Alginate Dressing, 4x4.75 (in/in) 1 x Per Day/30 Days ary Discharge Instructions: Apply to wound bed as instructed Secondary Dressing: Woven Gauze Sponges 2x2 in (Generic) 1 x Per Day/30 Days Discharge Instructions: Apply over primary dressing as directed. Secured With: Insurance underwriter, Sterile 2x75 (in/in) (Generic) 1 x Per Day/30 Days Discharge Instructions: Secure with stretch gauze as directed. Secured With: 61M Medipore H Soft Cloth Surgical T ape, 4 x 10 (in/yd) (Generic) 1 x Per Day/30 Days Discharge Instructions: Secure with tape as directed. 09/02/2023: The main area of the wound has healed. The callus near the lateral aspect of the tip of his toe has lifted and the ulcer underneath persists. He has also managed to rub of the blister on the medial aspect of his right second toe, presumably from rubbing against the great toe. Under the blister, the fat layer is exposed. I used [name] combination of scissors and forceps as well as [name] curette to  debride the skin off of the callus and the blister. We will apply silver alginate to both of these locations. The patient was wearing crocs in clinic today and he was reminded that these are not considered appropriate footwear for diabetics and that he needs to be wearing his forefoot offloading shoe at any rate due to the wounds on his toes. His blood pressure was once again noted to be markedly elevated. He was asymptomatic. He apparently has an appointment with his primary care doctor tomorrow and was advised to discuss this further at that appointment. He will follow-up with me in 2 weeks. Electronic Signature(s) Signed: 09/02/2023 11:59:34 AM By: [name] MD FACS Entered By: [name] on 09/02/2023 08:59:34 -------------------------------------------------------------------------------- HxROS Details Patient Name: Date of Service: [name], [name] 09/02/2023 11:15 [name] M Medical Record Number: [id number] Patient Account Number: [id number] Date of Birth/Sex: Treating RN: 27-Dec-1950 (72 y.o. M) Primary Care Provider: [name] Other Clinician: Referring Provider:  Treating Provider/Extender: [name]: 26 Information Obtained From Patient Eyes Medical History: Past Medical History Notes: Diabetic retinopathy Cardiovascular Medical History: Positive for: Hypertension Past Medical History Notes: Carotid artery stenosis Endocrine Medical History: Positive for: Type II Diabetes Negative for: Type I Diabetes Time with diabetes: 5 years Treated with: Insulin Blood sugar tested every day: Yes Tested : twice [name] day Musculoskeletal Medical History: Positive for: Osteoarthritis Neurologic COLEGROVE, [name] ([id number]) [id number] Page 12 of 13 Medical History: Positive for: Neuropathy Oncologic Medical History: Positive for: Received Radiation Past Medical History Notes: Prostate  cancer Immunizations Pneumococcal Vaccine: Received Pneumococcal Vaccination: No Implantable Devices None Family and Social History Cancer: No; Diabetes: Yes - Mother,Siblings; Heart Disease: No; Hereditary Spherocytosis: No; Hypertension: No; Kidney Disease: No; Lung Disease: No; Seizures: No; Stroke: No; Thyroid Problems: No; Tuberculosis: No; Never smoker; Marital Status - Single; Alcohol Use: Never; Drug Use: No History; Caffeine Use: Rarely; Financial Concerns: No; Food, Clothing or Shelter Needs: No; Support System Lacking: No; Transportation Concerns: No Electronic Signature(s) Signed: 09/02/2023 12:00:21 PM By: [name] MD FACS Entered By: [name] on 09/02/2023 08:56:58 -------------------------------------------------------------------------------- SuperBill Details Patient Name: Date of Service: [name], [name] 09/02/2023 Medical Record Number: [id number] Patient Account Number: [id number] Date of Birth/Sex: Treating RN: March 30, 1951 (72 y.o. M) Primary Care Provider: [name] Other Clinician: Referring Provider: Treating Provider/Extender: [name]: 26 Diagnosis Coding ICD-10 Codes Code Description [phone] Non-pressure chronic ulcer of other part of right foot with necrosis of bone L97.512 Non-pressure chronic ulcer of other part of right foot with fat layer exposed M86.171 Other acute osteomyelitis, right ankle and foot E11.621 Type 2 diabetes mellitus with foot ulcer I10 Essential (primary) hypertension N18.32 Chronic kidney disease, stage 3b Facility Procedures : CPT4 Code: [id number] Description: 97597 - DEBRIDE WOUND 1ST 20 SQ CM OR < ICD-10 Diagnosis Description L97.514 Non-pressure chronic ulcer of other part of right foot with necrosis of bone L97.512 Non-pressure chronic ulcer of other part of right foot with fat layer exposed Modifier: Quantity: 1 Physician Procedures : CPT4 Code Description  Modifier [id number] - WC PHYS LEVEL 4 - EST PT 25 ICD-10 Diagnosis Description L97.514 Non-pressure chronic ulcer of other part of right foot with necrosis of bone L97.512 Non-pressure chronic ulcer of other part of right foot  with fat layer exposed E11.621 Type 2 diabetes mellitus with foot ulcer I10 Essential (primary) hypertension Choate, Kavion ([id number]) [id number] Quantity: 1 7.pdf Page 13 of 13 : [id number] - WC PHYS DEBR WO ANESTH 20 SQ CM 1 ICD-10 Diagnosis Description L97.514 Non-pressure chronic ulcer of other part of right foot with necrosis of bone L97.512 Non-pressure chronic ulcer of other part of right foot with fat layer exposed Quantity: Electronic Signature(s) Signed: 09/02/2023 12:00:03 PM By: [name] MD FACS Entered By: [name] on 09/02/2023 09:00:02

## 2023-09-02 NOTE — Progress Notes (Signed)
Established Patient Office Visit   Subjective  Patient ID: [name], male    DOB: 01-Oct-1951  Age: 72 y.o. MRN: [id number]  Chief Complaint  Patient presents with   Medical Management of Chronic Issues    Patient is a 72 year old male seen for follow-up chronic conditions and med management.  Since last OFV patient's BP meds adjusted by clinic pharmacist to help with compliance.  Patient endorses taking Norvasc 5 mg daily, Coreg 25 mg twice daily, Cardura 8 mg daily, hydralazine 50 mg twice daily.  This regimen is causing less dizziness.  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  Also seen by endocrinology.  eGFR now 25 with Creatinine 2.35.  Past pt seen by nephrology.  Patient also had recent appointment with podiatry for right diabetic foot ulcer.    Patient Active Problem List   Diagnosis Date Noted   Diabetic ulcer of toe of right foot associated with type 2 diabetes mellitus (HCC) 07/03/2023   Chronic kidney disease, stage 3b (HCC) 01/23/2023   TIA (transient ischemic attack) 01/22/2023   Delayed sleep phase syndrome 11/13/2020   Prostate cancer (HCC) 09/16/2020   Severe nonproliferative diabetic retinopathy of right eye with macular edema associated with type 2 diabetes mellitus (HCC) 08/28/2020   Age-related nuclear cataract, bilateral 08/28/2020   Hypertensive retinopathy of both eyes 08/28/2020   Bilateral carotid artery stenosis 08/28/2020   Murmur 08/28/2020   Diabetic macular edema (HCC) 07/13/2020   Mild nonproliferative diabetic retinopathy of right eye with macular edema associated with type 2 diabetes mellitus (HCC) 07/13/2020   BPH associated with nocturia 02/21/2020   Type 2 diabetes mellitus with diabetic polyneuropathy, without long-term current use of insulin (HCC) 07/06/2019   Dyslipidemia 07/06/2019   ED (erectile dysfunction) 12/24/2018   Osteoarthritis 11/25/2018   Uncontrolled type 2 diabetes mellitus  with hyperglycemia (HCC) 11/12/2018   Hyperlipidemia associated with type 2 diabetes mellitus (HCC) 11/12/2018   Malignant hypertension 11/10/2018   Insomnia 11/10/2018   Diabetes (HCC) 01/23/2016   Depression 01/22/2016   Past Medical History:  Diagnosis Date   CKD (chronic kidney disease)    Diabetes mellitus without complication (HCC)    Hyperlipidemia    Hypertension    Insomnia    Prostate cancer (HCC)    Past Surgical History:  Procedure Laterality Date   PROSTATE BIOPSY     Social History   Tobacco Use   Smoking status: Never   Smokeless tobacco: Never  Vaping Use   Vaping status: Never Used  Substance Use Topics   Alcohol use: No   Drug use: No   Family History  Problem Relation Age of Onset   Hypertension Mother    Colon cancer Mother    Diabetes Brother    Hypertension Daughter    Breast cancer Neg Hx    Pancreatic cancer Neg Hx    Prostate cancer Neg Hx    No Known Allergies    ROS Negative unless stated above    Objective:     BP (!) 165/90 (BP Location: Left Arm, Patient Position: Sitting, Cuff Size: Normal)   Pulse 86   Temp 98.4 F (36.9 C) (Oral)   Ht 5\' 6"  (1.676 m)   Wt 162 lb 6.4 oz (73.7 kg)   SpO2 95%   BMI 26.21 kg/m  BP Readings from Last 3 Encounters:  09/02/23 (!) 165/90  08/31/23 (!) 130/90  07/22/23 (!) 161/91   Wt Readings from Last  3 Encounters:  09/02/23 162 lb 6.4 oz (73.7 kg)  08/31/23 160 lb 9.6 oz (72.8 kg)  07/03/23 151 lb (68.5 kg)      Physical Exam Constitutional:      General: He is not in acute distress.    Appearance: Normal appearance.  HENT:     Head: Normocephalic and atraumatic.     Nose: Nose normal.     Mouth/Throat:     Mouth: Mucous membranes are moist.  Cardiovascular:     Rate and Rhythm: Normal rate and regular rhythm.     Heart sounds: Normal heart sounds. No murmur heard.    No gallop.  Pulmonary:     Effort: Pulmonary effort is normal. No respiratory distress.     Breath  sounds: Normal breath sounds. No wheezing, rhonchi or rales.  Skin:    General: Skin is warm and dry.  Neurological:     Mental Status: He is alert and oriented to person, place, and time.     No results found for any visits on 09/02/23.    Assessment & Plan:  Essential hypertension -     Ambulatory referral to Nephrology  Type 2 diabetes mellitus with diabetic polyneuropathy, without long-term current use of insulin (HCC) -     Ambulatory referral to Nephrology  CKD (chronic kidney disease) stage 4, GFR 15-29 ml/min (HCC) -     Ambulatory referral to Nephrology  BP uncontrolled.  Trying to limit frequent medication changes to avoid confusion for pt.  Discussed the importance of medication compliance.  Will continue with Norvasc 5 mg daily, Coreg 25 mg twice daily, Cardura 8 mg, hydralazine 50 mg twice daily.  Lifestyle modification strongly encouraged.  Patient to check BP at home regularly.  Blood sugars elevated at home, but A1c 7.4% at endocrinology visit 08/31/2023.  Given recent decrease in kidney function, creatinine 2.49, GFR 25.39, and microalbumin/creatinine ratio 78.6 discussed the need for nephrology referral.  Renally dose medications and avoid nephrotoxic medications.  Questions answered to satisfaction.  Patient to schedule follow-up in the next few months.  Can also schedule follow-up with clinic pharmacist, Marylene Land.  Return in about 2 months (around 11/02/2023).   Deeann Saint, MD

## 2023-09-02 NOTE — Progress Notes (Signed)
[name], [name] ([id number]) [id number] Page 1 of 10 Visit Report for 09/02/2023 Arrival Information Details Patient Name: Date of Service: [name] 09/02/2023 11:15 [name] M Medical Record Number: [id number] Patient Account Number: [id number] Date of Birth/Sex: Treating RN: [birth year] (72 y.o. [name] Primary Care Ludella Pranger: [name] Other Clinician: Referring Rafay Dahan: Treating Adekunle Rohrbach/Extender: [name] in Treatment: 26 Visit Information History Since Last Visit Added or deleted any medications: No Patient Arrived: Ambulatory Any new allergies or adverse reactions: No Arrival Time: 11:18 Had [name] fall or experienced change in No Accompanied By: self activities of daily living that may affect Transfer Assistance: None risk of falls: Patient Identification Verified: Yes Signs or symptoms of abuse/neglect since last visito No Secondary Verification Process Completed: Yes Hospitalized since last visit: No Patient Requires Transmission-Based Precautions: No Implantable device outside of the clinic excluding No Patient Has Alerts: Yes cellular tissue based products placed in the center Patient Alerts: Patient on Blood Thinner since last visit: Has Dressing in Place as Prescribed: No Pain Present Now: No Electronic Signature(s) Signed: 09/02/2023 4:26:50 PM By: [name] RN, BSN Entered By: [name] on 09/02/2023 08:19:03 -------------------------------------------------------------------------------- Encounter Discharge Information Details Patient Name: Date of Service: [name], [name] 09/02/2023 11:15 [name] M Medical Record Number: [id number] Patient Account Number: [id number] Date of Birth/Sex: Treating RN: 07-24-1951 (72 y.o. [name] Primary Care Airis Barbee: [name] Other Clinician: Referring Lenoard Helbert: Treating Codie Krogh/Extender: [name] in  Treatment: 26 Encounter Discharge Information Items Post Procedure Vitals Discharge Condition: Stable Temperature (F): 98.4 Ambulatory Status: Ambulatory Pulse (bpm): 77 Discharge Destination: Home Respiratory Rate (breaths/min): 18 Transportation: Private Auto Blood Pressure (mmHg): 225/104 Accompanied By: self Schedule Follow-up Appointment: Yes Clinical Summary of Care: Patient Declined Electronic Signature(s) Signed: 09/02/2023 4:26:50 PM By: [name] RN, BSN Entered By: [name] on 09/02/2023 08:49:45 [name], [name] ([id number]) [id number] Page 2 of 10 -------------------------------------------------------------------------------- Lower Extremity Assessment Details Patient Name: Date of Service: [name] 09/02/2023 11:15 [name] M Medical Record Number: [id number] Patient Account Number: [id number] Date of Birth/Sex: Treating RN: 06-06-1951 (72 y.o. [name] Primary Care Canaan Holzer: [name] Other Clinician: Referring Bashir Marchetti: Treating Aireanna Luellen/Extender: [name]: 26 Edema Assessment Assessed: [Left: No] Franne Forts: No] Edema: [Left: N] [Right: o] Calf Left: Right: Point of Measurement: 33 cm From Medial Instep 34 cm Ankle Left: Right: Point of Measurement: 10 cm From Medial Instep 20.3 cm Vascular Assessment Pulses: Dorsalis Pedis Palpable: [Right:Yes] Extremity colors, hair growth, and conditions: Extremity Color: [Right:Normal] Hair Growth on Extremity: [Right:Yes] Temperature of Extremity: [Right:Warm] Capillary Refill: [Right:< 3 seconds] Dependent Rubor: [Right:No No] Electronic Signature(s) Signed: 09/02/2023 4:26:50 PM By: [name] RN, BSN Entered By: [name] on 09/02/2023 08:22:34 -------------------------------------------------------------------------------- Multi Wound Chart Details Patient Name: Date of Service: [name], [name]  09/02/2023 11:15 [name] M Medical Record Number: [id number] Patient Account Number: [id number] Date of Birth/Sex: Treating RN: Sep 07, 1951 (72 y.o. M) Primary Care Ashwini Jago: [name] Other Clinician: Referring Meira Wahba: Treating Celia Friedland/Extender: [name]: 26 Vital Signs Height(in): 67 Capillary Blood Glucose(mg/dl): 623 Weight(lbs): 762 Pulse(bpm): 77 Body Mass Index(BMI): 24.1 Blood Pressure(mmHg): 225/104 Temperature(F): 98.4 Respiratory Rate(breaths/min): 18 [10:Photos:] [N/[name]:N/[name] [id number] Page 3 of 10] Right T Second oe Right T Great oe N/[name] Wound Location: Blister Gradually Appeared N/[name] Wounding Event: Diabetic Wound/Ulcer of the Lower Diabetic Wound/Ulcer of the Lower N/[name] Primary Etiology: Extremity Extremity Hypertension, Type II Diabetes, Hypertension, Type  II Diabetes, N/[name] Comorbid History: Osteoarthritis, Neuropathy, Received Osteoarthritis, Neuropathy, Received Radiation Radiation 09/02/2023 02/04/2023 N/[name] Date Acquired: 0 26 N/[name] Weeks of Treatment: Open Open N/[name] Wound Status: No No N/[name] Wound Recurrence: 1.2x0.8x0.1 0.1x0.1x0.1 N/[name] Measurements L x W x D (cm) 0.754 0.008 N/[name] [name] (cm) : rea 0.075 0.001 N/[name] Volume (cm) : N/[name] 99.90% N/[name] % Reduction in [name] rea: N/[name] 100.00% N/[name] % Reduction in Volume: Grade 1 Grade 3 N/[name] Classification: Medium None Present N/[name] Exudate [name] mount: Serous N/[name] N/[name] Exudate Type: amber N/[name] N/[name] Exudate Color: Flat and Intact N/[name] N/[name] Wound Margin: Large (67-100%) None Present (0%) N/[name] Granulation [name] mount: Red N/[name] N/[name] Granulation Quality: None Present (0%) None Present (0%) N/[name] Necrotic [name] mount: Fat Layer (Subcutaneous Tissue): Yes Fascia: No N/[name] Exposed Structures: Fascia: No Fat Layer (Subcutaneous Tissue): No Tendon: No Tendon: No Muscle: No Muscle: No Joint: No Joint: No Bone: No Bone: No Small (1-33%) Large (67-100%)  N/[name] Epithelialization: Debridement - Selective/Open Wound Debridement - Selective/Open Wound N/[name] Debridement: Pre-procedure Verification/Time Out 11:25 11:25 N/[name] Taken: N/[name] Callus N/[name] Tissue Debrided: Skin/Epidermis Skin/Epidermis N/[name] Level: 0.75 0.71 N/[name] Debridement [name] (sq cm): rea Forceps, Scissors Curette, Forceps, Scissors N/[name] Instrument: Minimum Minimum N/[name] Bleeding: Pressure Pressure N/[name] Hemostasis [name] chieved: 0 0 N/[name] Procedural Pain: 0 0 N/[name] Post Procedural Pain: Procedure was tolerated well Procedure was tolerated well N/[name] Debridement Treatment Response: 1.2x0.8x0.1 0.9x1x0.1 N/[name] Post Debridement Measurements L x W x D (cm) 0.075 0.071 N/[name] Post Debridement Volume: (cm) No Abnormalities Noted Callus: Yes N/[name] Periwound Skin Texture: Dry/Scaly: Yes Dry/Scaly: Yes N/[name] Periwound Skin Moisture: Maceration: No No Abnormalities Noted No Abnormalities Noted N/[name] Periwound Skin Color: No Abnormality No Abnormality N/[name] Temperature: Debridement Debridement N/[name] Procedures Performed: Treatment Notes Wound #10 (Toe Second) Wound Laterality: Right Cleanser Normal Saline Discharge Instruction: Cleanse the wound with Normal Saline prior to applying [name] clean dressing using gauze sponges, not tissue or cotton balls. Soap and Water Discharge Instruction: May shower and wash wound with dial antibacterial soap and water prior to dressing change. Peri-Wound Care Sween Lotion (Moisturizing lotion) Discharge Instruction: Apply moisturizing lotion as directed Topical Primary Dressing Maxorb Extra Ag+ Alginate Dressing, 4x4.75 (in/in) Discharge Instruction: Apply to wound bed as instructed Secondary Dressing TEEL, [name] ([id number]) [id number] Page 4 of 10 Woven Gauze Sponges 2x2 in Discharge Instruction: Apply over primary dressing as directed. Secured With Conforming Stretch Gauze Bandage, Sterile 2x75 (in/in) Discharge Instruction: Secure with  stretch gauze as directed. 70M Medipore H Soft Cloth Surgical T ape, 4 x 10 (in/yd) Discharge Instruction: Secure with tape as directed. Compression Wrap Compression Stockings Add-Ons Wound #8 (Toe Great) Wound Laterality: Right Cleanser Normal Saline Discharge Instruction: Cleanse the wound with Normal Saline prior to applying [name] clean dressing using gauze sponges, not tissue or cotton balls. Soap and Water Discharge Instruction: May shower and wash wound with dial antibacterial soap and water prior to dressing change. Peri-Wound Care Sween Lotion (Moisturizing lotion) Discharge Instruction: Apply moisturizing lotion as directed Topical Primary Dressing Maxorb Extra Ag+ Alginate Dressing, 4x4.75 (in/in) Discharge Instruction: Apply to wound bed as instructed Secondary Dressing Woven Gauze Sponges 2x2 in Discharge Instruction: Apply over primary dressing as directed. Secured With Conforming Stretch Gauze Bandage, Sterile 2x75 (in/in) Discharge Instruction: Secure with stretch gauze as directed. 70M Medipore H Soft Cloth Surgical T ape, 4 x 10 (in/yd) Discharge Instruction: Secure with tape as directed. Compression Wrap Compression Stockings Add-Ons Electronic Signature(s) Signed: 09/02/2023 11:55:20 AM By: [name] MD FACS Entered  By: [name] on 09/02/2023 08:55:20 -------------------------------------------------------------------------------- Multi-Disciplinary Care Plan Details Patient Name: Date of Service: [name] 09/02/2023 11:15 [name] M Medical Record Number: [id number] Patient Account Number: [id number] Date of Birth/Sex: Treating RN: 08/30/1951 (72 y.o. [name] Primary Care Finlay Godbee: [name] Other Clinician: Referring Aaryav Hopfensperger: Treating Aleasha Fregeau/Extender: [name] in Treatment: 26 Multidisciplinary Care Plan reviewed with physician Rothbury, [name] ([id number]) [id number]  Page 5 of 10 Active Inactive Nutrition Nursing Diagnoses: Imbalanced nutrition Impaired glucose control: actual or potential Potential for alteratiion in Nutrition/Potential for imbalanced nutrition Goals: Patient/caregiver will maintain therapeutic glucose control Date Initiated: 03/17/2023 Target Resolution Date: 09/25/2023 Goal Status: Active Interventions: Assess HgA1c results as ordered upon admission and as needed Assess patient nutrition upon admission and as needed per policy Treatment Activities: Patient referred to Primary Care Physician for further nutritional evaluation : 03/17/2023 Notes: Wound/Skin Impairment Nursing Diagnoses: Impaired tissue integrity Knowledge deficit related to ulceration/compromised skin integrity Goals: Patient/caregiver will verbalize understanding of skin care regimen Date Initiated: 03/17/2023 Target Resolution Date: 09/25/2023 Goal Status: Active Ulcer/skin breakdown will have [name] volume reduction of 30% by week 4 Date Initiated: 03/17/2023 Date Inactivated: 04/02/2023 Target Resolution Date: 03/31/2023 Goal Status: Met Ulcer/skin breakdown will have [name] volume reduction of 50% by week 8 Date Initiated: 04/02/2023 Date Inactivated: 05/07/2023 Target Resolution Date: 04/30/2023 Goal Status: Met Interventions: Assess patient/caregiver ability to obtain necessary supplies Assess patient/caregiver ability to perform ulcer/skin care regimen upon admission and as needed Assess ulceration(s) every visit Treatment Activities: Skin care regimen initiated : 03/17/2023 Topical wound management initiated : 03/17/2023 Notes: Electronic Signature(s) Signed: 09/02/2023 4:26:50 PM By: [name] RN, BSN Entered By: [name] on 09/02/2023 59:56:38 -------------------------------------------------------------------------------- Pain Assessment Details Patient Name: Date of Service: [name], [name] 09/02/2023 11:15 [name] M Medical Record Number:  [id number] Patient Account Number: [id number] Date of Birth/Sex: Treating RN: 12-23-1950 (72 y.o. [name] Primary Care Tiara Maultsby: [name] Other Clinician: Referring Guida Asman: Treating Floye Fesler/Extender: [name]: 26 Active Problems Location of Pain Severity and Description of Pain [name], [name] ([id number]) [id number] Page 6 of 10 Patient Has Paino No Site Locations Rate the pain. Current Pain Level: 0 Pain Management and Medication Current Pain Management: Electronic Signature(s) Signed: 09/02/2023 4:26:50 PM By: [name] RN, BSN Entered By: [name] on 09/02/2023 08:20:46 -------------------------------------------------------------------------------- Patient/Caregiver Education Details Patient Name: Date of Service: [name], [name] 11/6/2024andnbsp11:15 [name] M Medical Record Number: [id number] Patient Account Number: [id number] Date of Birth/Gender: Treating RN: 1951/01/10 (72 y.o. [name] Primary Care Physician: [name] Other Clinician: Referring Physician: Treating Physician/Extender: [name] in Treatment: 26 Education Assessment Education Provided To: Patient Education Topics Provided Offloading: Methods: Explain/Verbal Responses: Reinforcements needed, State content correctly Wound/Skin Impairment: Methods: Explain/Verbal Responses: Reinforcements needed, State content correctly Electronic Signature(s) Signed: 09/02/2023 4:26:50 PM By: [name] RN, BSN Entered By: [name] on 09/02/2023 08:23:53 [name], [name] ([id number]) [id number] Page 7 of 10 -------------------------------------------------------------------------------- Wound Assessment Details Patient Name: Date of Service: [name] 09/02/2023 11:15 [name] M Medical Record Number: [id number] Patient Account Number:  [id number] Date of Birth/Sex: Treating RN: 05-09-1951 (72 y.o. [name] Primary Care Fujie Dickison: [name] Other Clinician: Referring Jayin Derousse: Treating Calieb Lichtman/Extender: [name]: 26 Wound Status Wound Number: 10 Primary Diabetic Wound/Ulcer of the Lower Extremity Etiology: Wound Location: Right T Second oe Wound Open Wounding Event: Blister Status: Date Acquired: 09/02/2023 Comorbid Hypertension, Type II  Diabetes, Osteoarthritis, Neuropathy, Weeks Of Treatment: 0 History: Received Radiation Clustered Wound: No Photos Wound Measurements Length: (cm) 1.2 Width: (cm) 0.8 Depth: (cm) 0.1 Area: (cm) 0.754 Volume: (cm) 0.075 % Reduction in Area: % Reduction in Volume: Epithelialization: Small (1-33%) Tunneling: No Undermining: No Wound Description Classification: Grade 1 Wound Margin: Flat and Intact Exudate Amount: Medium Exudate Type: Serous Exudate Color: amber Foul Odor After Cleansing: No Slough/Fibrino No Wound Bed Granulation Amount: Large (67-100%) Exposed Structure Granulation Quality: Red Fascia Exposed: No Necrotic Amount: None Present (0%) Fat Layer (Subcutaneous Tissue) Exposed: Yes Tendon Exposed: No Muscle Exposed: No Joint Exposed: No Bone Exposed: No Periwound Skin Texture Texture Color No Abnormalities Noted: Yes No Abnormalities Noted: Yes Moisture Temperature / Pain No Abnormalities Noted: No Temperature: No Abnormality Dry / Scaly: Yes Treatment Notes Wound #10 (Toe Second) Wound Laterality: Right Cleanser Normal Saline Discharge Instruction: Cleanse the wound with Normal Saline prior to applying [name] clean dressing using gauze sponges, not tissue or cotton balls. Soap and Water Discharge Instruction: May shower and wash wound with dial antibacterial soap and water prior to dressing change. Peri-Wound Care [name], [name] ([id number]) [id number] Page 8 of  10 Sween Lotion (Moisturizing lotion) Discharge Instruction: Apply moisturizing lotion as directed Topical Primary Dressing Maxorb Extra Ag+ Alginate Dressing, 4x4.75 (in/in) Discharge Instruction: Apply to wound bed as instructed Secondary Dressing Woven Gauze Sponges 2x2 in Discharge Instruction: Apply over primary dressing as directed. Secured With Conforming Stretch Gauze Bandage, Sterile 2x75 (in/in) Discharge Instruction: Secure with stretch gauze as directed. 60M Medipore H Soft Cloth Surgical T ape, 4 x 10 (in/yd) Discharge Instruction: Secure with tape as directed. Compression Wrap Compression Stockings Add-Ons Electronic Signature(s) Signed: 09/02/2023 4:26:50 PM By: [name] RN, BSN Entered By: [name] on 09/02/2023 08:36:26 -------------------------------------------------------------------------------- Wound Assessment Details Patient Name: Date of Service: [name], [name] 09/02/2023 11:15 [name] M Medical Record Number: [id number] Patient Account Number: [id number] Date of Birth/Sex: Treating RN: May 12, 1951 (72 y.o. [name] Primary Care Jeyson Deshotel: [name] Other Clinician: Referring Lucrezia Dehne: Treating Jayna Mulnix/Extender: [name]: 26 Wound Status Wound Number: 8 Primary Diabetic Wound/Ulcer of the Lower Extremity Etiology: Wound Location: Right T Great oe Wound Open Wounding Event: Gradually Appeared Status: Date Acquired: 02/04/2023 Comorbid Hypertension, Type II Diabetes, Osteoarthritis, Neuropathy, Weeks Of Treatment: 26 History: Received Radiation Clustered Wound: No Photos Wound Measurements Length: (cm) 0.1 Width: (cm) 0.1 Depth: (cm) 0.1 Area: (cm) 0.008 Volume: (cm) 0.001 [name], [name] ([id number]) Wound Description Classification: Grade 3 Exudate Amount: None Present Foul Odor After Cleansing: No Slough/Fibrino No % Reduction in Area: 99.9% % Reduction in Volume:  100% Epithelialization: Large (67-100%) Tunneling: No Undermining: No [id number] Page 9 of 10 Wound Bed Granulation Amount: None Present (0%) Exposed Structure Necrotic Amount: None Present (0%) Fascia Exposed: No Fat Layer (Subcutaneous Tissue) Exposed: No Tendon Exposed: No Muscle Exposed: No Joint Exposed: No Bone Exposed: No Periwound Skin Texture Texture Color No Abnormalities Noted: No No Abnormalities Noted: Yes Callus: Yes Temperature / Pain Temperature: No Abnormality Moisture No Abnormalities Noted: No Dry / Scaly: Yes Maceration: No Treatment Notes Wound #8 (Toe Great) Wound Laterality: Right Cleanser Normal Saline Discharge Instruction: Cleanse the wound with Normal Saline prior to applying [name] clean dressing using gauze sponges, not tissue or cotton balls. Soap and Water Discharge Instruction: May shower and wash wound with dial antibacterial soap and water prior to dressing change. Peri-Wound Care Sween Lotion (Moisturizing lotion) Discharge Instruction: Apply moisturizing lotion as directed  Topical Primary Dressing Maxorb Extra Ag+ Alginate Dressing, 4x4.75 (in/in) Discharge Instruction: Apply to wound bed as instructed Secondary Dressing Woven Gauze Sponges 2x2 in Discharge Instruction: Apply over primary dressing as directed. Secured With Conforming Stretch Gauze Bandage, Sterile 2x75 (in/in) Discharge Instruction: Secure with stretch gauze as directed. 10M Medipore H Soft Cloth Surgical T ape, 4 x 10 (in/yd) Discharge Instruction: Secure with tape as directed. Compression Wrap Compression Stockings Add-Ons Electronic Signature(s) Signed: 09/02/2023 4:26:50 PM By: [name] RN, BSN Entered By: [name] on 09/02/2023 08:35:54 -------------------------------------------------------------------------------- Vitals Details Patient Name: Date of Service: [name], [name] 09/02/2023 11:15 [name] [name], [name]  ([id number]) [id number] Page 10 of 10 Medical Record Number: [id number] Patient Account Number: [id number] Date of Birth/Sex: Treating RN: 22-Jan-1951 (72 y.o. [name] Primary Care Kalyna Paolella: [name] Other Clinician: Referring Garrie Elenes: Treating Malayia Spizzirri/Extender: [name] in Treatment: 26 Vital Signs Time Taken: 11:19 Temperature (F): 98.4 Height (in): 67 Pulse (bpm): 77 Weight (lbs): 154 Respiratory Rate (breaths/min): 18 Body Mass Index (BMI): 24.1 Blood Pressure (mmHg): 225/104 Capillary Blood Glucose (mg/dl): 329 Reference Range: 80 - 120 mg / dl Notes glucose per pt report this am, notified MD of BP. Pt to see PCP tomorrow. Electronic Signature(s) Signed: 09/02/2023 4:26:50 PM By: [name] RN, BSN Entered By: [name] on 09/02/2023 08:50:48

## 2023-09-02 NOTE — Patient Instructions (Addendum)
A referral to nephrology was placed.  Patient expect a phone call about setting up an appointment.  Blood pressure in blood sugar to help preserve kidney function.  Need to adjust medications.

## 2023-09-08 ENCOUNTER — Telehealth: Payer: Self-pay | Admitting: Family Medicine

## 2023-09-08 NOTE — Telephone Encounter (Signed)
Pt states bp is 95/67. Asking should he continue his meds.

## 2023-09-09 DIAGNOSIS — R351 Nocturia: Secondary | ICD-10-CM | POA: Diagnosis not present

## 2023-09-09 DIAGNOSIS — R7989 Other specified abnormal findings of blood chemistry: Secondary | ICD-10-CM | POA: Diagnosis not present

## 2023-09-09 DIAGNOSIS — N401 Enlarged prostate with lower urinary tract symptoms: Secondary | ICD-10-CM | POA: Diagnosis not present

## 2023-09-09 DIAGNOSIS — E875 Hyperkalemia: Secondary | ICD-10-CM | POA: Diagnosis not present

## 2023-09-09 DIAGNOSIS — E1122 Type 2 diabetes mellitus with diabetic chronic kidney disease: Secondary | ICD-10-CM | POA: Diagnosis not present

## 2023-09-09 DIAGNOSIS — I129 Hypertensive chronic kidney disease with stage 1 through stage 4 chronic kidney disease, or unspecified chronic kidney disease: Secondary | ICD-10-CM | POA: Diagnosis not present

## 2023-09-09 DIAGNOSIS — N1832 Chronic kidney disease, stage 3b: Secondary | ICD-10-CM | POA: Diagnosis not present

## 2023-09-09 DIAGNOSIS — D631 Anemia in chronic kidney disease: Secondary | ICD-10-CM | POA: Diagnosis not present

## 2023-09-09 DIAGNOSIS — N189 Chronic kidney disease, unspecified: Secondary | ICD-10-CM | POA: Diagnosis not present

## 2023-09-09 NOTE — Telephone Encounter (Signed)
If BP is persistently under 100/60, he can decrease dose of Hydralazine from 50 mg bid to 25 mg bid (1/2 tab). No changes in rest. Continue monitoring BP regularly and report BP readings to PCP in 2 weeks. Thanks, BJ

## 2023-09-09 NOTE — Telephone Encounter (Signed)
Spoke with patient, BP was 126/79 yesterday evening, patient has not taking BP this morning, patient is Aware and will call if bp is persistently under 100/60

## 2023-09-16 ENCOUNTER — Encounter (HOSPITAL_BASED_OUTPATIENT_CLINIC_OR_DEPARTMENT_OTHER): Payer: Medicare HMO | Admitting: General Surgery

## 2023-09-16 DIAGNOSIS — L97512 Non-pressure chronic ulcer of other part of right foot with fat layer exposed: Secondary | ICD-10-CM | POA: Diagnosis not present

## 2023-09-16 DIAGNOSIS — N1832 Chronic kidney disease, stage 3b: Secondary | ICD-10-CM | POA: Diagnosis not present

## 2023-09-16 DIAGNOSIS — E1169 Type 2 diabetes mellitus with other specified complication: Secondary | ICD-10-CM | POA: Diagnosis not present

## 2023-09-16 DIAGNOSIS — I129 Hypertensive chronic kidney disease with stage 1 through stage 4 chronic kidney disease, or unspecified chronic kidney disease: Secondary | ICD-10-CM | POA: Diagnosis not present

## 2023-09-16 DIAGNOSIS — E11621 Type 2 diabetes mellitus with foot ulcer: Secondary | ICD-10-CM | POA: Diagnosis not present

## 2023-09-16 DIAGNOSIS — M86171 Other acute osteomyelitis, right ankle and foot: Secondary | ICD-10-CM | POA: Diagnosis not present

## 2023-09-16 DIAGNOSIS — E1122 Type 2 diabetes mellitus with diabetic chronic kidney disease: Secondary | ICD-10-CM | POA: Diagnosis not present

## 2023-09-16 DIAGNOSIS — L97514 Non-pressure chronic ulcer of other part of right foot with necrosis of bone: Secondary | ICD-10-CM | POA: Diagnosis not present

## 2023-09-17 NOTE — Progress Notes (Signed)
[facility], [name] ([id number]) [id number] Page 1 of 9 Visit Report for 09/16/2023 Arrival Information Details Patient Name: Date of Service: [name] 09/16/2023 7:30 [name] M Medical Record Number: [id number] Patient Account Number: [id number] Date of Birth/Sex: Treating RN: 26-Aug-1951 (72 y.o. [name] Primary Care Corinne Goucher: [name] Other Clinician: Referring Leary Mcnulty: Treating Yudith Norlander/Extender: [name] in Treatment: 28 Visit Information History Since Last Visit Added or deleted any medications: No Patient Arrived: Ambulatory Any new allergies or adverse reactions: No Arrival Time: 07:37 Had [name] fall or experienced change in No Accompanied By: self activities of daily living that may affect Transfer Assistance: None risk of falls: Patient Identification Verified: Yes Signs or symptoms of abuse/neglect since last visito No Secondary Verification Process Completed: Yes Hospitalized since last visit: No Patient Requires Transmission-Based Precautions: No Implantable device outside of the clinic excluding No Patient Has Alerts: Yes cellular tissue based products placed in the center Patient Alerts: Patient on Blood Thinner since last visit: Has Dressing in Place as Prescribed: Yes Pain Present Now: No Electronic Signature(s) Signed: 09/16/2023 5:11:33 PM By: [name] RN, BSN Entered By: [name] on 09/16/2023 07:38:51 -------------------------------------------------------------------------------- Encounter Discharge Information Details Patient Name: Date of Service: [name], [name] 09/16/2023 7:30 [name] M Medical Record Number: [id number] Patient Account Number: [id number] Date of Birth/Sex: Treating RN: 12-13-1950 (72 y.o. [name] Primary Care Liliane Mallis: [name] Other Clinician: Referring Maleeah Crossman: Treating Quilla Freeze/Extender: [name] in  Treatment: 28 Encounter Discharge Information Items Post Procedure Vitals Discharge Condition: Stable Temperature (F): 99.2 Ambulatory Status: Ambulatory Pulse (bpm): 73 Discharge Destination: Home Respiratory Rate (breaths/min): 18 Transportation: Private Auto Blood Pressure (mmHg): 183/87 Accompanied By: self Schedule Follow-up Appointment: Yes Clinical Summary of Care: Patient Declined Electronic Signature(s) Signed: 09/16/2023 5:11:33 PM By: [name] RN, BSN Entered By: [name] on 09/16/2023 08:09:19 [name], [name] ([id number]) [id number] Page 2 of 9 -------------------------------------------------------------------------------- Lower Extremity Assessment Details Patient Name: Date of Service: [name] 09/16/2023 7:30 [name] M Medical Record Number: [id number] Patient Account Number: [id number] Date of Birth/Sex: Treating RN: 06/30/1951 (72 y.o. [name] Primary Care Cameka Rae: [name] Other Clinician: Referring Neeko Pharo: Treating Rajean Desantiago/Extender: [name]: 28 Edema Assessment Assessed: [Left: No] [Right: No] Edema: [Left: N] [Right: o] Calf Left: Right: Point of Measurement: 33 cm From Medial Instep 34 cm Ankle Left: Right: Point of Measurement: 10 cm From Medial Instep 20.3 cm Vascular Assessment Pulses: Dorsalis Pedis Palpable: [Right:Yes] Extremity colors, hair growth, and conditions: Extremity Color: [Right:Normal] Hair Growth on Extremity: [Right:Yes] Temperature of Extremity: [Right:Warm] Capillary Refill: [Right:< 3 seconds] Dependent Rubor: [Right:No No] Electronic Signature(s) Signed: 09/16/2023 5:11:33 PM By: [name] RN, BSN Entered By: [name] on 09/16/2023 07:43:48 -------------------------------------------------------------------------------- Multi Wound Chart Details Patient Name: Date of Service: [name], [name]  09/16/2023 7:30 [name] M Medical Record Number: [id number] Patient Account Number: [id number] Date of Birth/Sex: Treating RN: 05/02/1951 (72 y.o. M) Primary Care Shirley Bolle: [name] Other Clinician: Referring Abie Cheek: Treating Tavious Griesinger/Extender: [name]: 28 Vital Signs Height(in): 67 Capillary Blood Glucose(mg/dl): 623 Weight(lbs): 762 Pulse(bpm): 73 Body Mass Index(BMI): 24.1 Blood Pressure(mmHg): 183/87 Temperature(F): 99.2 Respiratory Rate(breaths/min): 18 [10:Photos:] [N/[name]:N/[name] [id number] Page 3 of 9] Right T Second oe Right T Great oe N/[name] Wound Location: Blister Gradually Appeared N/[name] Wounding Event: Diabetic Wound/Ulcer of the Lower Diabetic Wound/Ulcer of the Lower N/[name] Primary Etiology: Extremity Extremity Hypertension, Type II Diabetes, Hypertension, Type  II Diabetes, N/[name] Comorbid History: Osteoarthritis, Neuropathy, Received Osteoarthritis, Neuropathy, Received Radiation Radiation 09/02/2023 02/04/2023 N/[name] Date Acquired: 2 28 N/[name] Weeks of Treatment: Open Open N/[name] Wound Status: No No N/[name] Wound Recurrence: 0.2x0.2x0.1 0.4x0.4x0.1 N/[name] Measurements L x W x D (cm) 0.031 0.126 N/[name] [name] (cm) : rea 0.003 0.013 N/[name] Volume (cm) : 95.90% 98.90% N/[name] % Reduction in [name] rea: 96.00% 99.40% N/[name] % Reduction in Volume: Grade 1 Grade 3 N/[name] Classification: Small Medium N/[name] Exudate [name] mount: Serosanguineous Serosanguineous N/[name] Exudate Type: red, brown red, brown N/[name] Exudate Color: Flat and Intact Distinct, outline attached N/[name] Wound Margin: Large (67-100%) Large (67-100%) N/[name] Granulation [name] mount: Red Red N/[name] Granulation Quality: None Present (0%) Small (1-33%) N/[name] Necrotic [name] mount: Fat Layer (Subcutaneous Tissue): Yes Fat Layer (Subcutaneous Tissue): Yes N/[name] Exposed Structures: Fascia: No Fascia: No Tendon: No Tendon: No Muscle: No Muscle: No Joint: No Joint: No Bone: No Bone: No Large  (67-100%) Small (1-33%) N/[name] Epithelialization: Debridement - Excisional Debridement - Excisional N/[name] Debridement: Pre-procedure Verification/Time Out 07:50 07:50 N/[name] Taken: Callus, Subcutaneous, Slough Callus, Subcutaneous, Slough N/[name] Tissue Debrided: Skin/Subcutaneous Tissue Skin/Subcutaneous Tissue N/[name] Level: 0.04 0.25 N/[name] Debridement [name] (sq cm): rea Curette Curette N/[name] Instrument: Minimum Minimum N/[name] Bleeding: Pressure Pressure N/[name] Hemostasis [name] chieved: 0 0 N/[name] Procedural Pain: 0 0 N/[name] Post Procedural Pain: Procedure was tolerated well Procedure was tolerated well N/[name] Debridement Treatment Response: 0.2x0.2x0.1 0.4x0.4x0.1 N/[name] Post Debridement Measurements L x W x D (cm) 0.003 0.013 N/[name] Post Debridement Volume: (cm) No Abnormalities Noted Callus: Yes N/[name] Periwound Skin Texture: Dry/Scaly: Yes Dry/Scaly: Yes N/[name] Periwound Skin Moisture: Maceration: No No Abnormalities Noted No Abnormalities Noted N/[name] Periwound Skin Color: No Abnormality No Abnormality N/[name] Temperature: Debridement Debridement N/[name] Procedures Performed: Treatment Notes Electronic Signature(s) Signed: 09/16/2023 7:58:04 AM By: [name] MD FACS Entered By: [name] on 09/16/2023 07:58:04 -------------------------------------------------------------------------------- Multi-Disciplinary Care Plan Details Patient Name: Date of Service: [name], [name] 09/16/2023 7:30 [name] M Medical Record Number: [id number] Patient Account Number: [id number] Date of Birth/Sex: Treating RN: 01-28-1951 (72 y.o. [name] Primary Care Denesha Brouse: [name] Other Clinician: [name] ([id number]) [id number] Page 4 of 9 Referring Marinell Igarashi: Treating Jordin Dambrosio/Extender: [name] in Treatment: 28 Multidisciplinary Care Plan reviewed with physician Active Inactive Nutrition Nursing Diagnoses: Imbalanced nutrition Impaired glucose  control: actual or potential Potential for alteratiion in Nutrition/Potential for imbalanced nutrition Goals: Patient/caregiver will maintain therapeutic glucose control Date Initiated: 03/17/2023 Target Resolution Date: 09/25/2023 Goal Status: Active Interventions: Assess HgA1c results as ordered upon admission and as needed Assess patient nutrition upon admission and as needed per policy Treatment Activities: Patient referred to Primary Care Physician for further nutritional evaluation : 03/17/2023 Notes: Wound/Skin Impairment Nursing Diagnoses: Impaired tissue integrity Knowledge deficit related to ulceration/compromised skin integrity Goals: Patient/caregiver will verbalize understanding of skin care regimen Date Initiated: 03/17/2023 Target Resolution Date: 09/25/2023 Goal Status: Active Ulcer/skin breakdown will have [name] volume reduction of 30% by week 4 Date Initiated: 03/17/2023 Date Inactivated: 04/02/2023 Target Resolution Date: 03/31/2023 Goal Status: Met Ulcer/skin breakdown will have [name] volume reduction of 50% by week 8 Date Initiated: 04/02/2023 Date Inactivated: 05/07/2023 Target Resolution Date: 04/30/2023 Goal Status: Met Interventions: Assess patient/caregiver ability to obtain necessary supplies Assess patient/caregiver ability to perform ulcer/skin care regimen upon admission and as needed Assess ulceration(s) every visit Treatment Activities: Skin care regimen initiated : 03/17/2023 Topical wound management initiated : 03/17/2023 Notes: Electronic Signature(s) Signed: 09/16/2023 5:11:33 PM By: [name] RN, BSN  Entered By: [name] on 09/16/2023 07:47:14 -------------------------------------------------------------------------------- Pain Assessment Details Patient Name: Date of Service: [name] 09/16/2023 7:30 [name] M Medical Record Number: [id number] Patient Account Number: [id number] Date of Birth/Sex: Treating RN: Jul 15, 1951 (72 y.o. [name] Primary Care Alverna Fawley: [name] Other Clinician: Referring Leilany Digeronimo: Treating Kiylee Thoreson/Extender: [name] [place], [place] ([id number]) [id number] Page 5 of 9 Weeks in Treatment: 28 Active Problems Location of Pain Severity and Description of Pain Patient Has Paino No Site Locations Rate the pain. Current Pain Level: 0 Pain Management and Medication Current Pain Management: Electronic Signature(s) Signed: 09/16/2023 5:11:33 PM By: [name] RN, BSN Entered By: [name] on 09/16/2023 07:41:05 -------------------------------------------------------------------------------- Patient/Caregiver Education Details Patient Name: Date of Service: [name], [name] 11/20/2024andnbsp7:30 [name] M Medical Record Number: [id number] Patient Account Number: [id number] Date of Birth/Gender: Treating RN: 1951/08/23 (72 y.o. [name] Primary Care Physician: [name] Other Clinician: Referring Physician: Treating Physician/Extender: [name] in Treatment: 28 Education Assessment Education Provided To: Patient Education Topics Provided Elevated Blood Sugar/ Impact on Healing: Methods: Explain/Verbal Responses: Reinforcements needed, State content correctly Offloading: Methods: Explain/Verbal Responses: Reinforcements needed, State content correctly Wound/Skin Impairment: Methods: Explain/Verbal Responses: Reinforcements needed, State content correctly Electronic Signature(s) Signed: 09/16/2023 5:11:33 PM By: [name] RN, BSN [name], [name] ([id number]) [phone].pdf Page 6 of 9 Entered By: [name] on 09/16/2023 07:48:26 -------------------------------------------------------------------------------- Wound Assessment Details Patient Name: Date of Service: [name] 09/16/2023 7:30 [name] M Medical Record Number:  [id number] Patient Account Number: [id number] Date of Birth/Sex: Treating RN: 1951/02/03 (72 y.o. [name] Primary Care Arienne Gartin: [name] Other Clinician: Referring Ellis Koffler: Treating Galen Russman/Extender: [name]: 28 Wound Status Wound Number: 10 Primary Diabetic Wound/Ulcer of the Lower Extremity Etiology: Wound Location: Right T Second oe Wound Open Wounding Event: Blister Status: Date Acquired: 09/02/2023 Comorbid Hypertension, Type II Diabetes, Osteoarthritis, Neuropathy, Weeks Of Treatment: 2 History: Received Radiation Clustered Wound: No Photos Wound Measurements Length: (cm) 0.2 Width: (cm) 0.2 Depth: (cm) 0.1 Area: (cm) 0.031 Volume: (cm) 0.003 % Reduction in Area: 95.9% % Reduction in Volume: 96% Epithelialization: Large (67-100%) Tunneling: No Undermining: No Wound Description Classification: Grade 1 Wound Margin: Flat and Intact Exudate Amount: Small Exudate Type: Serosanguineous Exudate Color: red, brown Foul Odor After Cleansing: No Slough/Fibrino No Wound Bed Granulation Amount: Large (67-100%) Exposed Structure Granulation Quality: Red Fascia Exposed: No Necrotic Amount: None Present (0%) Fat Layer (Subcutaneous Tissue) Exposed: Yes Tendon Exposed: No Muscle Exposed: No Joint Exposed: No Bone Exposed: No Periwound Skin Texture Texture Color No Abnormalities Noted: Yes No Abnormalities Noted: Yes Moisture Temperature / Pain No Abnormalities Noted: No Temperature: No Abnormality Dry / Scaly: Yes Treatment Notes [name], [name] ([id number]) [id number] Page 7 of 9 Wound #10 (Toe Second) Wound Laterality: Right Cleanser Normal Saline Discharge Instruction: Cleanse the wound with Normal Saline prior to applying [name] clean dressing using gauze sponges, not tissue or cotton balls. Soap and Water Discharge Instruction: May shower and wash wound with dial  antibacterial soap and water prior to dressing change. Peri-Wound Care Sween Lotion (Moisturizing lotion) Discharge Instruction: Apply moisturizing lotion as directed Topical Primary Dressing Maxorb Extra Ag+ Alginate Dressing, 4x4.75 (in/in) Discharge Instruction: Apply to wound bed as instructed Secondary Dressing Woven Gauze Sponges 2x2 in Discharge Instruction: Apply over primary dressing as directed. Secured With Conforming Stretch Gauze Bandage, Sterile 2x75 (in/in) Discharge Instruction: Secure with stretch gauze as directed. 88M Medipore H Soft Cloth  Surgical T ape, 4 x 10 (in/yd) Discharge Instruction: Secure with tape as directed. Compression Wrap Compression Stockings Add-Ons Electronic Signature(s) Signed: 09/16/2023 5:11:33 PM By: [name] RN, BSN Entered By: [name] on 09/16/2023 07:45:53 -------------------------------------------------------------------------------- Wound Assessment Details Patient Name: Date of Service: [name], [name] 09/16/2023 7:30 [name] M Medical Record Number: [id number] Patient Account Number: [id number] Date of Birth/Sex: Treating RN: 11-Feb-1951 (72 y.o. [name] Primary Care Wynema Garoutte: [name] Other Clinician: Referring Dezman Granda: Treating Argie Lober/Extender: [name]: 28 Wound Status Wound Number: 8 Primary Diabetic Wound/Ulcer of the Lower Extremity Etiology: Wound Location: Right T Great oe Wound Open Wounding Event: Gradually Appeared Status: Date Acquired: 02/04/2023 Comorbid Hypertension, Type II Diabetes, Osteoarthritis, Neuropathy, Weeks Of Treatment: 28 History: Received Radiation Clustered Wound: No Photos [name], [name] ([id number]) [id number] Page 8 of 9 Wound Measurements Length: (cm) 0.4 Width: (cm) 0.4 Depth: (cm) 0.1 Area: (cm) 0.126 Volume: (cm) 0.013 % Reduction in Area: 98.9% % Reduction in Volume:  99.4% Epithelialization: Small (1-33%) Tunneling: No Undermining: No Wound Description Classification: Grade 3 Wound Margin: Distinct, outline attached Exudate Amount: Medium Exudate Type: Serosanguineous Exudate Color: red, brown Foul Odor After Cleansing: No Slough/Fibrino No Wound Bed Granulation Amount: Large (67-100%) Exposed Structure Granulation Quality: Red Fascia Exposed: No Necrotic Amount: Small (1-33%) Fat Layer (Subcutaneous Tissue) Exposed: Yes Necrotic Quality: Adherent Slough Tendon Exposed: No Muscle Exposed: No Joint Exposed: No Bone Exposed: No Periwound Skin Texture Texture Color No Abnormalities Noted: No No Abnormalities Noted: Yes Callus: Yes Temperature / Pain Temperature: No Abnormality Moisture No Abnormalities Noted: No Dry / Scaly: Yes Maceration: No Treatment Notes Wound #8 (Toe Great) Wound Laterality: Right Cleanser Normal Saline Discharge Instruction: Cleanse the wound with Normal Saline prior to applying [name] clean dressing using gauze sponges, not tissue or cotton balls. Soap and Water Discharge Instruction: May shower and wash wound with dial antibacterial soap and water prior to dressing change. Peri-Wound Care Sween Lotion (Moisturizing lotion) Discharge Instruction: Apply moisturizing lotion as directed Topical Primary Dressing Maxorb Extra Ag+ Alginate Dressing, 4x4.75 (in/in) Discharge Instruction: Apply to wound bed as instructed Secondary Dressing Woven Gauze Sponges 2x2 in Discharge Instruction: Apply over primary dressing as directed. Secured With Conforming Stretch Gauze Bandage, Sterile 2x75 (in/in) Discharge Instruction: Secure with stretch gauze as directed. 7M Medipore H Soft Cloth Surgical Tape, 4 x 10 (in/yd) [name], [name] ([id number]) [id number] Page 9 of 9 Discharge Instruction: Secure with tape as directed. Compression Wrap Compression Stockings Add-Ons Electronic  Signature(s) Signed: 09/16/2023 5:11:33 PM By: [name] RN, BSN Entered By: [name] on 09/16/2023 07:46:43 -------------------------------------------------------------------------------- Vitals Details Patient Name: Date of Service: [name], [name] 09/16/2023 7:30 [name] M Medical Record Number: [id number] Patient Account Number: [id number] Date of Birth/Sex: Treating RN: Mar 27, 1951 (72 y.o. [name] Primary Care Keilyn Haggard: [name] Other Clinician: Referring Chele Cornell: Treating Jovonda Selner/Extender: [name] in Treatment: 28 Vital Signs Time Taken: 07:38 Temperature (F): 99.2 Height (in): 67 Pulse (bpm): 73 Weight (lbs): 154 Respiratory Rate (breaths/min): 18 Body Mass Index (BMI): 24.1 Blood Pressure (mmHg): 183/87 Capillary Blood Glucose (mg/dl): 093 Reference Range: 80 - 120 mg / dl Notes glucose per pt report this am Electronic Signature(s) Signed: 09/16/2023 5:11:33 PM By: [name] RN, BSN Entered By: [name] on 09/16/2023 07:40:19

## 2023-09-17 NOTE — Progress Notes (Signed)
[place], [name] ([id number]) [id number] Page 1 of 13 Visit Report for 09/16/2023 Chief Complaint Document Details Patient Name: Date of Service: [name] 09/16/2023 7:30 A M Medical Record Number: [id number] Patient Account Number: [id number] Date of Birth/Sex: Treating RN: 1951/03/03 (72 y.o. M) Primary Care Provider: [name] Other Clinician: Referring Provider: Treating Provider/Extender: [name] in Treatment: 28 Information Obtained from: Patient Chief Complaint Bilateral T Ulcers oe 03/02/2023: right great toe ulcer with osteomyelitis Electronic Signature(s) Signed: 09/16/2023 7:58:12 AM By: [name] MD FACS Entered By: [name] on 09/16/2023 04:58:12 -------------------------------------------------------------------------------- Debridement Details Patient Name: Date of Service: [name], [name] 09/16/2023 7:30 A M Medical Record Number: [id number] Patient Account Number: [id number] Date of Birth/Sex: Treating RN: Mar 16, 1951 (72 y.o. [name] Primary Care Provider: [name] Other Clinician: Referring Provider: Treating Provider/Extender: [name] in Treatment: 28 Debridement Performed for Assessment: Wound #8 Right T Great oe Performed By: Physician [name], MD The following information was scribed by: [name] The information was scribed for: [name] Debridement Type: Debridement Severity of Tissue Pre Debridement: Fat layer exposed Level of Consciousness (Pre-procedure): Awake and Alert Pre-procedure Verification/Time Out Yes - 07:50 Taken: Start Time: 07:52 Percent of Wound Bed Debrided: 200% T Area Debrided (cm): otal 0.25 Tissue and other material debrided: Viable, Non-Viable, Callus, Slough, Subcutaneous, Skin: Epidermis, Slough Level: Skin/Subcutaneous Tissue Debridement Description:  Excisional Instrument: Curette Bleeding: Minimum Hemostasis Achieved: Pressure Procedural Pain: 0 Post Procedural Pain: 0 Response to Treatment: Procedure was tolerated well Level of Consciousness (Post- Awake and Alert procedure): Post Debridement Measurements of Total Wound Length: (cm) 0.4 Width: (cm) 0.4 Depth: (cm) 0.1 [place], Manus ([id number]) [id number] Page 2 of 13 Volume: (cm) 0.013 Character of Wound/Ulcer Post Debridement: Improved Severity of Tissue Post Debridement: Fat layer exposed Post Procedure Diagnosis Same as Pre-procedure Electronic Signature(s) Signed: 09/16/2023 8:12:00 AM By: [name] MD FACS Signed: 09/16/2023 5:11:33 PM By: [name] RN, BSN Entered By: [name] on 09/16/2023 04:55:45 -------------------------------------------------------------------------------- Debridement Details Patient Name: Date of Service: [name], [name] 09/16/2023 7:30 A M Medical Record Number: [id number] Patient Account Number: [id number] Date of Birth/Sex: Treating RN: 1951/09/17 (72 y.o. [name] Primary Care Provider: [name] Other Clinician: Referring Provider: Treating Provider/Extender: [name] in Treatment: 28 Debridement Performed for Assessment: Wound #10 Right T Second oe Performed By: Physician [name], MD The following information was scribed by: [name] The information was scribed for: [name] Debridement Type: Debridement Severity of Tissue Pre Debridement: Fat layer exposed Level of Consciousness (Pre-procedure): Awake and Alert Pre-procedure Verification/Time Out Yes - 07:50 Taken: Start Time: 07:52 Percent of Wound Bed Debrided: 120% T Area Debrided (cm): otal 0.04 Tissue and other material debrided: Viable, Non-Viable, Callus, Slough, Subcutaneous, Skin: Epidermis, Slough Level: Skin/Subcutaneous Tissue Debridement  Description: Excisional Instrument: Curette Bleeding: Minimum Hemostasis Achieved: Pressure Procedural Pain: 0 Post Procedural Pain: 0 Response to Treatment: Procedure was tolerated well Level of Consciousness (Post- Awake and Alert procedure): Post Debridement Measurements of Total Wound Length: (cm) 0.2 Width: (cm) 0.2 Depth: (cm) 0.1 Volume: (cm) 0.003 Character of Wound/Ulcer Post Debridement: Improved Severity of Tissue Post Debridement: Fat layer exposed Post Procedure Diagnosis Same as Pre-procedure Electronic Signature(s) Signed: 09/16/2023 8:12:00 AM By: [name] MD FACS Signed: 09/16/2023 5:11:33 PM By: [name] RN, BSN Entered By: [name] on 09/16/2023 04:56:42 [place], [name] ([id number]) [id number] Page 3 of 13 -------------------------------------------------------------------------------- HPI Details Patient Name: Date  of Service: [name] 09/16/2023 7:30 A M Medical Record Number: [id number] Patient Account Number: [id number] Date of Birth/Sex: Treating RN: Nov 03, 1950 (72 y.o. M) Primary Care Provider: [name] Other Clinician: Referring Provider: Treating Provider/Extender: [name] in Treatment: 28 History of Present Illness HPI Description: 09/26/2020 on evaluation today patient appears to be doing somewhat poorly in regard to his bilateral feet at this point due to issues that he is been having that developed about 2 weeks ago. He was walking home which was about a mile and states that he first noted the areas on the right foot beginning. These were blisters that develop. The left started Thanksgiving day when he was up standing more cooking. He did see his primary care provider who referred him to podiatry. He saw Dr. Allena Katz and Dr. Allena Katz recommended Betadine moistened gauze dressings to the wounds and to follow-up in 2 weeks. With that being said the patient  subsequently was concerned about infection 2 to 3 days later as was his family member who is with him today. I believe this was a sister. With that being said subsequently he ended up going to urgent care at that point he was given Diflucan and Keflex he is done with both at this time. He is could be canceling the appointment with Dr. Allena Katz they tell me at this point. His most recent hemoglobin A1c was 6.6 on November 12. His ABIs were 1.2 on the right and 1.03 on the left and appear to be doing excellent. With that being said he does not have diabetic shoes and I feel like the shoes were likely the culprit for what led to this issue currently. There does not appear to be any signs of systemic infection nor local infection at this point which is great news. The patient does have a history of hypertension as well as potentially a recurrence of prostate cancer he is being followed by the cancer center for that at this point 10/03/2020 upon evaluation today patient appears to be doing better in regard to his toe ulcers. He has been tolerating the dressing changes without complication. Fortunately there is no signs of active infection at this time. The right great toe and fifth toe are the 2 worst out of everything here he has a couple that have healed on the left foot there is really nothing remaining open though he has several areas of callus on the left foot in fact 3 on the third fourth and fifth toes. That is going need to be removed today as well. 10/10/2020 upon evaluation today patient appears to be doing well in regard to his wounds. He still has wounds open on the first, second, and fifth toes of the right foot. Left foot is completely healed and the right fourth toe is also completely healed today. Overall very pleased with how things seem to be progressing. 10/17/2020 on evaluation today patient appears to be doing much better in regard to the right first, second, and fifth toes. He is making  excellent progress and to be honest I am extremely pleased with where things stand today. Fortunately there is no signs of active infection at this time. No fevers, chills, nausea, vomiting, or diarrhea. 11/07/2020 upon evaluation today patient appears to actually be doing excellent in regard to his toe ulcers. The fifth and first toe on the right foot are what is left at this point. There does not appear to be signs of infection currently which is  great news. With that being said I did perform some sharp debridement today to remove some of the callus around the edges of the wound 11/14/2020 upon evaluation today patient appears to be doing excellent in regard to his toe ulcers. In fact he is almost completely healed he just has 1 area on the great toe remaining at this time and this is very small. Overall he is extremely pleased with where things stand as MI. 11/21/2020 upon evaluation today patient appears to be doing well with regard to his foot ulcer. In fact this appears to be completely healed on the great toe which was the last of the areas of concern. Fortunately there is no signs of active infection at this time. No fevers, chills, nausea, vomiting, or diarrhea. READMISSION 03/02/2023 This is a now 72 year old type II diabetic (last hemoglobin A1c 7.9%) who returns with a right great toe ulcer. On February 12, 2023, he was seen by podiatry who noted: "He has superficial wounds to the second and fourth toes of the right foot however the hallux does not straight a very large bulbous macerated mass of tissue including the nail plate the tuft of the toe. Once this was attempted to be debrided is noted that there was a foul odor no purulence no significant cellulitic process but bone was visible through the wound. There is gray necrotic tissue beneath the macerated cutis." Plain radiographs taken that day demonstrated breakdown of the distal phalanx, gas in the tissue of the toe. A stat MRI was  subsequently ordered. Those results are copied here: IMPRESSION: 1. Osteomyelitis of the distal phalanx great toe with overlying ulceration and speckled gas in the soft tissues of the great toe. 2. Mild dorsal subcutaneous edema in the forefoot, cellulitis is not excluded by imaging. 3. Accentuated T2 signal dorsally over the second and third toes, conceivably from bandaging or blistering, correlate with visual inspection. 4. Small focus of metal artifact along the plantar soft tissues of the forefoot below the third metatarsal head, not visible on recent conventional radiographs, probably a microscopic metallic particle. He was started on Augmentin for 10-day course. He apparently requested a second opinion regarding the osteomyelitis and need for amputation and saw Dr. Allena Katz on May 1. Dr. Eliane Decree note is incomplete at this time so it is not clear what was discussed. I do not see any formal vascular studies in the patient's chart. ABI in clinic today was noncompressible, but he has easily palpable distal pulses. 03/09/2023 He has a new ulcer that has opened up on the tip of his second toe. This is limited to the fat layer. There is a little bit of slough on the surface. The great toe ulcer has continued presence of necrotic muscle and subcutaneous tissue with bone exposure. He is taking Augmentin and doxycycline. According to his insurance, no preapproval is necessary for hyperbaric oxygen therapy, but they cannot guarantee it will be paid for. 03/17/2023: There is a bit of maceration of the skin between his great and second toes, but no frank tissue breakdown. The ulcer on the tip of his second toe is a little bit smaller. The great toe ulcer is cleaner. He has received approval for hyperbaric oxygen therapy. 04/02/2023: The second toe is healed. The ulcer on the tip of his great toe has hypertrophic granulation tissue. Bone is still palpable in the center. There is some accumulation of callus  and senescent skin around the wound margins. We have been trying to get in touch with  him to schedule him for initiation of hyperbaric oxygen therapy. He has been approved for Epicord, but we do not have 1 here for him today. [place], [name] ([id number]) [id number] Page 4 of 13 04/09/2023: The ulcer on the tip of his great toe has filled in with more granulation tissue. It does still probe to bone, but appears much healthier. He started hyperbaric oxygen therapy on Monday. We have been struggling a little bit with hypoglycemia during and after his treatments, but we are working on a resolution to that. We are going to apply Epicord no. 1 today. 04/17/2023: There has been substantial infill of granulation tissue. I am no longer able to probe to bone. He is doing well with his hyperbaric oxygen therapy and we have not experienced any further episodes of hypoglycemia. 04/23/2023: His wound is responding beautifully to both the skin substitute and hyperbaric oxygen therapy. Current measurements:1.4x2.4x0.1cm. There is just slight slough on the surface and bone is completely covered with a good layer of healthy tissue. 04/29/2023: The wound continues to improve. There is some periwound callus buildup as well as thin slough on the wound surface. Measurements L x W x D (cm) 1.4x1.8x0.1 Area (cm): 1.979 % Reduction in Area: 82.90% . % Reduction in Volume: 91.40% 05/07/2023: The wound is smaller again today. There is a little bit of callus accumulation around the margins with some slough on the surface. Excellent coverage of the bone with healthy-looking granulation tissue. Wound measurements: 0.6x1.2x0.1 05/14/2023: The wound is smaller again today. There is callus accumulation around the edges with a little bit of slough on the surface. Measurements L x W x D (cm) 0.5x1.2x0.2 Area (cm): 0.471 Volume (cm) : 0.094 % Reduction in Area: 95.90% % Reduction in Volume:  95.90% 05/21/2023: He says that he was on his feet most of Saturday and his foot got wet. There is tissue breakdown secondary to moisture on the plantar aspect of his toe. There is gray-green slough and callus accumulation. 05/28/2023: The wound looks better today. There is still some moisture related tissue breakdown that has progressed on the plantar aspect of his toe, but I think this is residual from last week, as there is no active tissue maceration. Measurements L x W x D (cm) 0.8x1x0. Area (cm): 0.628 Volume (cm) : 0.188 % Reduction in Area: 94.60% % Reduction in Volume: 91.90% 06/05/2023: The wound has improved considerably over the past week. There has been substantial epithelialization. There is some callus accumulation around the edges and light slough on the surface. 06/12/2023: The wound continues to improve. It is down to less than half a centimeter in greatest dimension and is completely flush with the surrounding tissue. There is some dry skin and callus accumulation around the edges with light slough on the surface. Measurements L x W x D (cm) 0.3x0.5x0.1 Area (cm): 0.118 Volume (cm) : 0.012 % Reduction in Area: 99.00% % Reduction in Volume: 99.50% 06/19/2023: The wound measured the same this week, but visually it appears smaller. There is some surrounding callus and dry skin but the surface looks robust and healthy. 06/26/2023: Unfortunately, there has been massive breakdown of his wound. Although the dressing did not have any drainage on it, there was extensive fluid and blood that collected underneath the periwound callus that has resulted in substantial deterioration. The bone remains covered with soft tissue, but the wound is markedly larger. There appears to be some pressure-induced tissue injury on the weightbearing aspect of the toe. 07/06/2023: The culture that  I took at his last visit was positive for methicillin-resistant Staph aureus with tetracycline resistance. I  prescribed a course of Bactrim and he is currently taking this. His wound is markedly improved. No evidence of pressure-induced tissue injury. There is some callus accumulation around the wound edges and some slough on the surface. 07/13/2023: The wound continues to improve. There is a little bit of callus accumulation around the edges and the skin is starting to roll inward, but the surface is clean and the dimensions have decreased. 07/20/2023: The wound measured smaller again today. It is fairly superficial at this point. It is a little bit on the dry side. 07/29/2023: The wound is smaller again this week. There is a little bit of callus accumulation around the edges and very minimal slough on the surface. 08/05/2023: The wound continues to contract, albeit slowly. There is some callus and dry skin accumulation around the edges and some light slough on the surface. 08/19/2023: T oday, the main area of the wound is quite small and superficial. There is some discoloration, however, underneath callus near the lateral aspect of the tip of his toe. It appears that moisture has resulted in tissue breakdown and this area is open. 09/02/2023: The main area of the wound has healed. The callus near the lateral aspect of the tip of his toe has lifted and the ulcer underneath persists. He has also managed to rub a blister on the medial aspect of his right second toe, presumably from rubbing against the great toe. Under the blister, the fat layer is exposed. 09/16/2023: Both wounds are smaller today. There is some callus surrounding each of them. No concern for infection. Electronic Signature(s) Signed: 09/16/2023 7:59:10 AM By: [name] MD FACS Entered By: [name] on 09/16/2023 04:59:10 [place], [name] ([id number]) [id number] Page 5 of 13 -------------------------------------------------------------------------------- Physical Exam Details Patient Name: Date of  Service: [name] 09/16/2023 7:30 A M Medical Record Number: [id number] Patient Account Number: [id number] Date of Birth/Sex: Treating RN: 15-Jul-1951 (72 y.o. M) Primary Care Provider: [name] Other Clinician: Referring Provider: Treating Provider/Extender: [name]: 28 Constitutional Hypertensive, asymptomatic. . . . no acute distress. Respiratory Normal work of breathing on room air.. Notes 09/16/2023: Both wounds are smaller today. There is some callus surrounding each of them. No concern for infection. Electronic Signature(s) Signed: 09/16/2023 8:06:33 AM By: [name] MD FACS Entered By: [name] on 09/16/2023 05:06:32 -------------------------------------------------------------------------------- Physician Orders Details Patient Name: Date of Service: [name], [name] 09/16/2023 7:30 A M Medical Record Number: [id number] Patient Account Number: [id number] Date of Birth/Sex: Treating RN: 1951-01-22 (72 y.o. [name] Primary Care Provider: [name] Other Clinician: Referring Provider: Treating Provider/Extender: [name] in Treatment: 28 The following information was scribed by: [name] The information was scribed for: [name] Verbal / Phone Orders: No Diagnosis Coding ICD-10 Coding Code Description L97.514 Non-pressure chronic ulcer of other part of right foot with necrosis of bone L97.512 Non-pressure chronic ulcer of other part of right foot with fat layer exposed M86.171 Other acute osteomyelitis, right ankle and foot E11.621 Type 2 diabetes mellitus with foot ulcer I10 Essential (primary) hypertension N18.32 Chronic kidney disease, stage 3b Follow-up Appointments Return appointment in 3 weeks. - Dr. Lady Gary RM 1 Wed 12/18 @ 07:30 am Bathing/ Shower/ Hygiene May shower and wash wound with soap and water. Off-Loading Wound #8  Right T Great oe Other: - Wear the [employer] front offloading  sandal when walking, minimal weight bearing right foot Wound Treatment Wound #10 - T Second oe Wound Laterality: Right STATER, [name] ([id number]) [id number] Page 6 of 13 Cleanser: Normal Saline (Generic) 1 x Per Day/30 Days Discharge Instructions: Cleanse the wound with Normal Saline prior to applying a clean dressing using gauze sponges, not tissue or cotton balls. Cleanser: Soap and Water 1 x Per Day/30 Days Discharge Instructions: May shower and wash wound with dial antibacterial soap and water prior to dressing change. Peri-Wound Care: Sween Lotion (Moisturizing lotion) 1 x Per Day/30 Days Discharge Instructions: Apply moisturizing lotion as directed Prim Dressing: Maxorb Extra Ag+ Alginate Dressing, 4x4.75 (in/in) 1 x Per Day/30 Days ary Discharge Instructions: Apply to wound bed as instructed Secondary Dressing: Woven Gauze Sponges 2x2 in (Generic) 1 x Per Day/30 Days Discharge Instructions: Apply over primary dressing as directed. Secured With: Insurance underwriter, Sterile 2x75 (in/in) (Generic) 1 x Per Day/30 Days Discharge Instructions: Secure with stretch gauze as directed. Secured With: 9M Medipore H Soft Cloth Surgical T ape, 4 x 10 (in/yd) (Generic) 1 x Per Day/30 Days Discharge Instructions: Secure with tape as directed. Wound #8 - T Great oe Wound Laterality: Right Cleanser: Normal Saline (Generic) 1 x Per Day/30 Days Discharge Instructions: Cleanse the wound with Normal Saline prior to applying a clean dressing using gauze sponges, not tissue or cotton balls. Cleanser: Soap and Water 1 x Per Day/30 Days Discharge Instructions: May shower and wash wound with dial antibacterial soap and water prior to dressing change. Peri-Wound Care: Sween Lotion (Moisturizing lotion) 1 x Per Day/30 Days Discharge Instructions: Apply moisturizing lotion as directed Prim Dressing: Maxorb  Extra Ag+ Alginate Dressing, 4x4.75 (in/in) 1 x Per Day/30 Days ary Discharge Instructions: Apply to wound bed as instructed Secondary Dressing: Woven Gauze Sponges 2x2 in (Generic) 1 x Per Day/30 Days Discharge Instructions: Apply over primary dressing as directed. Secured With: Insurance underwriter, Sterile 2x75 (in/in) (Generic) 1 x Per Day/30 Days Discharge Instructions: Secure with stretch gauze as directed. Secured With: 9M Medipore H Soft Cloth Surgical T ape, 4 x 10 (in/yd) (Generic) 1 x Per Day/30 Days Discharge Instructions: Secure with tape as directed. Electronic Signature(s) Signed: 09/16/2023 8:12:00 AM By: [name] MD FACS Entered By: [name] on 09/16/2023 05:07:01 -------------------------------------------------------------------------------- Problem List Details Patient Name: Date of Service: [name], [name] 09/16/2023 7:30 A M Medical Record Number: [id number] Patient Account Number: [id number] Date of Birth/Sex: Treating RN: 07/13/1951 (72 y.o. [name] Primary Care Provider: [name] Other Clinician: Referring Provider: Treating Provider/Extender: [name] in Treatment: 28 Active Problems ICD-10 Encounter Code Description Active Date MDM Diagnosis L97.514 Non-pressure chronic ulcer of other part of right foot with necrosis of bone 03/02/2023 No Yes Jackowski, Joaquin ([id number]) [id number] Page 7 of 13 [phone] Non-pressure chronic ulcer of other part of right foot with fat layer exposed 09/02/2023 No Yes M86.171 Other acute osteomyelitis, right ankle and foot 03/02/2023 No Yes E11.621 Type 2 diabetes mellitus with foot ulcer 03/02/2023 No Yes I10 Essential (primary) hypertension 03/02/2023 No Yes N18.32 Chronic kidney disease, stage 3b 03/02/2023 No Yes Inactive Problems Resolved Problems ICD-10 Code Description Active Date Resolved Date L97.512 Non-pressure chronic  ulcer of other part of right foot with fat layer exposed 03/09/2023 03/09/2023 Electronic Signature(s) Signed: 09/16/2023 7:57:49 AM By: [name] MD FACS Entered By: [name] on 09/16/2023 04:57:49 -------------------------------------------------------------------------------- Progress Note Details Patient Name: Date of Service: [name], [name] 09/16/2023 7:30 A  M Medical Record Number: [id number] Patient Account Number: [id number] Date of Birth/Sex: Treating RN: 05-Mar-1951 (72 y.o. M) Primary Care Provider: [name] Other Clinician: Referring Provider: Treating Provider/Extender: [name] in Treatment: 28 Subjective Chief Complaint Information obtained from Patient Bilateral T Ulcers 03/02/2023: right great toe ulcer with osteomyelitis oe History of Present Illness (HPI) 09/26/2020 on evaluation today patient appears to be doing somewhat poorly in regard to his bilateral feet at this point due to issues that he is been having that developed about 2 weeks ago. He was walking home which was about a mile and states that he first noted the areas on the right foot beginning. These were blisters that develop. The left started Thanksgiving day when he was up standing more cooking. He did see his primary care provider who referred him to podiatry. He saw Dr. Allena Katz and Dr. Allena Katz recommended Betadine moistened gauze dressings to the wounds and to follow-up in 2 weeks. With that being said the patient subsequently was concerned about infection 2 to 3 days later as was his family member who is with him today. I believe this was a sister. With that being said subsequently he ended up going to urgent care at that point he was given Diflucan and Keflex he is done with both at this time. He is could be canceling the appointment with Dr. Allena Katz they tell me at this point. His most recent hemoglobin A1c was 6.6 on November 12. His ABIs were 1.2 on the  right and 1.03 on the left and appear to be doing excellent. With that being said he does not have diabetic shoes and I feel like the shoes were likely the culprit for what led to this issue currently. There does not appear to be any signs of systemic infection nor local infection at this point which is great news. The patient does have a history of hypertension as well as potentially a recurrence of prostate cancer he is being followed by the cancer center for that at this point 10/03/2020 upon evaluation today patient appears to be doing better in regard to his toe ulcers. He has been tolerating the dressing changes without complication. Fortunately there is no signs of active infection at this time. The right great toe and fifth toe are the 2 worst out of everything here he has a couple that have healed on the left foot there is really nothing remaining open though he has several areas of callus on the left foot in fact 3 on the third fourth and fifth toes. That is going need to be removed today as well. 10/10/2020 upon evaluation today patient appears to be doing well in regard to his wounds. He still has wounds open on the first, second, and fifth toes of the right foot. Left foot is completely healed and the right fourth toe is also completely healed today. Overall very pleased with how things seem to be progressing. [place], [name] ([id number]) [id number] Page 8 of 13 10/17/2020 on evaluation today patient appears to be doing much better in regard to the right first, second, and fifth toes. He is making excellent progress and to be honest I am extremely pleased with where things stand today. Fortunately there is no signs of active infection at this time. No fevers, chills, nausea, vomiting, or diarrhea. 11/07/2020 upon evaluation today patient appears to actually be doing excellent in regard to his toe ulcers. The fifth and first toe on the right foot are what is left  at this point. There does not appear to be signs of infection currently which is great news. With that being said I did perform some sharp debridement today to remove some of the callus around the edges of the wound 11/14/2020 upon evaluation today patient appears to be doing excellent in regard to his toe ulcers. In fact he is almost completely healed he just has 1 area on the great toe remaining at this time and this is very small. Overall he is extremely pleased with where things stand as MI. 11/21/2020 upon evaluation today patient appears to be doing well with regard to his foot ulcer. In fact this appears to be completely healed on the great toe which was the last of the areas of concern. Fortunately there is no signs of active infection at this time. No fevers, chills, nausea, vomiting, or diarrhea. READMISSION 03/02/2023 This is a now 72 year old type II diabetic (last hemoglobin A1c 7.9%) who returns with a right great toe ulcer. On February 12, 2023, he was seen by podiatry who noted: "He has superficial wounds to the second and fourth toes of the right foot however the hallux does not straight a very large bulbous macerated mass of tissue including the nail plate the tuft of the toe. Once this was attempted to be debrided is noted that there was a foul odor no purulence no significant cellulitic process but bone was visible through the wound. There is gray necrotic tissue beneath the macerated cutis." Plain radiographs taken that day demonstrated breakdown of the distal phalanx, gas in the tissue of the toe. A stat MRI was subsequently ordered. Those results are copied here: IMPRESSION: 1. Osteomyelitis of the distal phalanx great toe with overlying ulceration and speckled gas in the soft tissues of the great toe. 2. Mild dorsal subcutaneous edema in the forefoot, cellulitis is not excluded by imaging. 3. Accentuated T2 signal dorsally over the second and third toes, conceivably from  bandaging or blistering, correlate with visual inspection. 4. Small focus of metal artifact along the plantar soft tissues of the forefoot below the third metatarsal head, not visible on recent conventional radiographs, probably a microscopic metallic particle. He was started on Augmentin for 10-day course. He apparently requested a second opinion regarding the osteomyelitis and need for amputation and saw Dr. Allena Katz on May 1. Dr. Eliane Decree note is incomplete at this time so it is not clear what was discussed. I do not see any formal vascular studies in the patient's chart. ABI in clinic today was noncompressible, but he has easily palpable distal pulses. 03/09/2023 He has a new ulcer that has opened up on the tip of his second toe. This is limited to the fat layer. There is a little bit of slough on the surface. The great toe ulcer has continued presence of necrotic muscle and subcutaneous tissue with bone exposure. He is taking Augmentin and doxycycline. According to his insurance, no preapproval is necessary for hyperbaric oxygen therapy, but they cannot guarantee it will be paid for. 03/17/2023: There is a bit of maceration of the skin between his great and second toes, but no frank tissue breakdown. The ulcer on the tip of his second toe is a little bit smaller. The great toe ulcer is cleaner. He has received approval for hyperbaric oxygen therapy. 04/02/2023: The second toe is healed. The ulcer on the tip of his great toe has hypertrophic granulation tissue. Bone is still palpable in the center. There is some accumulation of callus and senescent  skin around the wound margins. We have been trying to get in touch with him to schedule him for initiation of hyperbaric oxygen therapy. He has been approved for Epicord, but we do not have 1 here for him today. 04/09/2023: The ulcer on the tip of his great toe has filled in with more granulation tissue. It does still probe to bone, but appears much healthier.  He started hyperbaric oxygen therapy on Monday. We have been struggling a little bit with hypoglycemia during and after his treatments, but we are working on a resolution to that. We are going to apply Epicord no. 1 today. 04/17/2023: There has been substantial infill of granulation tissue. I am no longer able to probe to bone. He is doing well with his hyperbaric oxygen therapy and we have not experienced any further episodes of hypoglycemia. 04/23/2023: His wound is responding beautifully to both the skin substitute and hyperbaric oxygen therapy. Current measurements:1.4x2.4x0.1cm. There is just slight slough on the surface and bone is completely covered with a good layer of healthy tissue. 04/29/2023: The wound continues to improve. There is some periwound callus buildup as well as thin slough on the wound surface. Measurements L x W x D (cm) 1.4x1.8x0.1 Area (cm): 1.979 % Reduction in Area: 82.90% . % Reduction in Volume: 91.40% 05/07/2023: The wound is smaller again today. There is a little bit of callus accumulation around the margins with some slough on the surface. Excellent coverage of the bone with healthy-looking granulation tissue. Wound measurements: 0.6x1.2x0.1 05/14/2023: The wound is smaller again today. There is callus accumulation around the edges with a little bit of slough on the surface. Measurements L x W x D (cm) 0.5x1.2x0.2 Area (cm): 0.471 Volume (cm) : 0.094 % Reduction in Area: 95.90% % Reduction in Volume: 95.90% 05/21/2023: He says that he was on his feet most of Saturday and his foot got wet. There is tissue breakdown secondary to moisture on the plantar aspect of his toe. There is gray-green slough and callus accumulation. 05/28/2023: The wound looks better today. There is still some moisture related tissue breakdown that has progressed on the plantar aspect of his toe, but I think this is residual from last week, as there is no active tissue maceration. Measurements L x  W x D (cm) 0.8x1x0. Area (cm): 0.628 Volume (cm) : 0.188 % Reduction in Area: 94.60% % Reduction in Volume: 91.90% 06/05/2023: The wound has improved considerably over the past week. There has been substantial epithelialization. There is some callus accumulation around the edges and light slough on the surface. [place], [name] ([id number]) [id number] Page 9 of 13 06/12/2023: The wound continues to improve. It is down to less than half a centimeter in greatest dimension and is completely flush with the surrounding tissue. There is some dry skin and callus accumulation around the edges with light slough on the surface. Measurements L x W x D (cm) 0.3x0.5x0.1 Area (cm): 0.118 Volume (cm) : 0.012 % Reduction in Area: 99.00% % Reduction in Volume: 99.50% 06/19/2023: The wound measured the same this week, but visually it appears smaller. There is some surrounding callus and dry skin but the surface looks robust and healthy. 06/26/2023: Unfortunately, there has been massive breakdown of his wound. Although the dressing did not have any drainage on it, there was extensive fluid and blood that collected underneath the periwound callus that has resulted in substantial deterioration. The bone remains covered with soft tissue, but the wound is markedly larger. There appears to be  some pressure-induced tissue injury on the weightbearing aspect of the toe. 07/06/2023: The culture that I took at his last visit was positive for methicillin-resistant Staph aureus with tetracycline resistance. I prescribed a course of Bactrim and he is currently taking this. His wound is markedly improved. No evidence of pressure-induced tissue injury. There is some callus accumulation around the wound edges and some slough on the surface. 07/13/2023: The wound continues to improve. There is a little bit of callus accumulation around the edges and the skin is starting to roll inward, but the surface  is clean and the dimensions have decreased. 07/20/2023: The wound measured smaller again today. It is fairly superficial at this point. It is a little bit on the dry side. 07/29/2023: The wound is smaller again this week. There is a little bit of callus accumulation around the edges and very minimal slough on the surface. 08/05/2023: The wound continues to contract, albeit slowly. There is some callus and dry skin accumulation around the edges and some light slough on the surface. 08/19/2023: T oday, the main area of the wound is quite small and superficial. There is some discoloration, however, underneath callus near the lateral aspect of the tip of his toe. It appears that moisture has resulted in tissue breakdown and this area is open. 09/02/2023: The main area of the wound has healed. The callus near the lateral aspect of the tip of his toe has lifted and the ulcer underneath persists. He has also managed to rub a blister on the medial aspect of his right second toe, presumably from rubbing against the great toe. Under the blister, the fat layer is exposed. 09/16/2023: Both wounds are smaller today. There is some callus surrounding each of them. No concern for infection. Patient History Information obtained from Patient. Family History Diabetes - Mother,Siblings, No family history of Cancer, Heart Disease, Hereditary Spherocytosis, Hypertension, Kidney Disease, Lung Disease, Seizures, Stroke, Thyroid Problems, Tuberculosis. Social History Never smoker, Marital Status - Single, Alcohol Use - Never, Drug Use - No History, Caffeine Use - Rarely. Medical History Cardiovascular Patient has history of Hypertension Endocrine Patient has history of Type II Diabetes Denies history of Type I Diabetes Musculoskeletal Patient has history of Osteoarthritis Neurologic Patient has history of Neuropathy Oncologic Patient has history of Received Radiation Medical A Surgical History  Notes nd Eyes Diabetic retinopathy Cardiovascular Carotid artery stenosis Oncologic Prostate cancer Objective Constitutional Hypertensive, asymptomatic. no acute distress. Vitals Time Taken: 7:38 AM, Height: 67 in, Weight: 154 lbs, BMI: 24.1, Temperature: 99.2 F, Pulse: 73 bpm, Respiratory Rate: 18 breaths/min, Blood Pressure: 183/87 mmHg, Capillary Blood Glucose: 206 mg/dl. General Notes: glucose per pt report this am MARKQUIS, BRUCK ([id number]) [id number].pdf Page 10 of 13 Respiratory Normal work of breathing on room air.. General Notes: 09/16/2023: Both wounds are smaller today. There is some callus surrounding each of them. No concern for infection. Integumentary (Hair, Skin) Wound #10 status is Open. Original cause of wound was Blister. The date acquired was: 09/02/2023. The wound has been in treatment 2 weeks. The wound is located on the Right T Second. The wound measures 0.2cm length x 0.2cm width x 0.1cm depth; 0.031cm^2 area and 0.003cm^3 volume. There is Fat Layer oe (Subcutaneous Tissue) exposed. There is no tunneling or undermining noted. There is a small amount of serosanguineous drainage noted. The wound margin is flat and intact. There is large (67-100%) red granulation within the wound bed. There is no necrotic tissue within the wound bed. The periwound skin appearance  had no abnormalities noted for texture. The periwound skin appearance had no abnormalities noted for color. The periwound skin appearance exhibited: Dry/Scaly. Periwound temperature was noted as No Abnormality. Wound #8 status is Open. Original cause of wound was Gradually Appeared. The date acquired was: 02/04/2023. The wound has been in treatment 28 weeks. The wound is located on the Right T Great. The wound measures 0.4cm length x 0.4cm width x 0.1cm depth; 0.126cm^2 area and 0.013cm^3 volume. There is oe Fat Layer (Subcutaneous Tissue) exposed. There is no tunneling or  undermining noted. There is a medium amount of serosanguineous drainage noted. The wound margin is distinct with the outline attached to the wound base. There is large (67-100%) red granulation within the wound bed. There is a small (1-33%) amount of necrotic tissue within the wound bed including Adherent Slough. The periwound skin appearance had no abnormalities noted for color. The periwound skin appearance exhibited: Callus, Dry/Scaly. The periwound skin appearance did not exhibit: Maceration. Periwound temperature was noted as No Abnormality. Assessment Active Problems ICD-10 Non-pressure chronic ulcer of other part of right foot with necrosis of bone Non-pressure chronic ulcer of other part of right foot with fat layer exposed Other acute osteomyelitis, right ankle and foot Type 2 diabetes mellitus with foot ulcer Essential (primary) hypertension Chronic kidney disease, stage 3b Procedures Wound #10 Pre-procedure diagnosis of Wound #10 is a Diabetic Wound/Ulcer of the Lower Extremity located on the Right T Second .Severity of Tissue Pre Debridement oe is: Fat layer exposed. There was a Excisional Skin/Subcutaneous Tissue Debridement with a total area of 0.04 sq cm performed by [name], MD. With the following instrument(s): Curette to remove Viable and Non-Viable tissue/material. Material removed includes Callus, Subcutaneous Tissue, Slough, and Skin: Epidermis. No specimens were taken. A time out was conducted at 07:50, prior to the start of the procedure. A Minimum amount of bleeding was controlled with Pressure. The procedure was tolerated well with a pain level of 0 throughout and a pain level of 0 following the procedure. Post Debridement Measurements: 0.2cm length x 0.2cm width x 0.1cm depth; 0.003cm^3 volume. Character of Wound/Ulcer Post Debridement is improved. Severity of Tissue Post Debridement is: Fat layer exposed. Post procedure Diagnosis Wound #10: Same as  Pre-Procedure Wound #8 Pre-procedure diagnosis of Wound #8 is a Diabetic Wound/Ulcer of the Lower Extremity located on the Right T Great .Severity of Tissue Pre Debridement is: oe Fat layer exposed. There was a Excisional Skin/Subcutaneous Tissue Debridement with a total area of 0.25 sq cm performed by [name], MD. With the following instrument(s): Curette to remove Viable and Non-Viable tissue/material. Material removed includes Callus, Subcutaneous Tissue, Slough, and Skin: Epidermis. No specimens were taken. A time out was conducted at 07:50, prior to the start of the procedure. A Minimum amount of bleeding was controlled with Pressure. The procedure was tolerated well with a pain level of 0 throughout and a pain level of 0 following the procedure. Post Debridement Measurements: 0.4cm length x 0.4cm width x 0.1cm depth; 0.013cm^3 volume. Character of Wound/Ulcer Post Debridement is improved. Severity of Tissue Post Debridement is: Fat layer exposed. Post procedure Diagnosis Wound #8: Same as Pre-Procedure Plan Follow-up Appointments: Return appointment in 3 weeks. - Dr. Lady Gary RM 1 Wed 12/18 @ 07:30 am Bathing/ Shower/ Hygiene: May shower and wash wound with soap and water. Off-Loading: Wound #8 Right T Great: oe Other: - Wear the [employer] front offloading sandal when walking, minimal weight bearing right foot WOUND #10: - T Second  Wound Laterality: Right oe Cleanser: Normal Saline (Generic) 1 x Per Day/30 Days Discharge Instructions: Cleanse the wound with Normal Saline prior to applying a clean dressing using gauze sponges, not tissue or cotton balls. Cleanser: Soap and Water 1 x Per Day/30 Days Discharge Instructions: May shower and wash wound with dial antibacterial soap and water prior to dressing change. Peri-Wound Care: Sween Lotion (Moisturizing lotion) 1 x Per Day/30 Days Discharge Instructions: Apply moisturizing lotion as directed Prim Dressing: Maxorb Extra Ag+  Alginate Dressing, 4x4.75 (in/in) 1 x Per Day/30 Days ary Discharge Instructions: Apply to wound bed as instructed Secondary Dressing: Woven Gauze Sponges 2x2 in (Generic) 1 x Per Day/30 Days [name] ([id number]) [id number] Page 11 of 13 Discharge Instructions: Apply over primary dressing as directed. Secured With: Insurance underwriter, Sterile 2x75 (in/in) (Generic) 1 x Per Day/30 Days Discharge Instructions: Secure with stretch gauze as directed. Secured With: 666M Medipore H Soft Cloth Surgical T ape, 4 x 10 (in/yd) (Generic) 1 x Per Day/30 Days Discharge Instructions: Secure with tape as directed. WOUND #8: - T Great Wound Laterality: Right oe Cleanser: Normal Saline (Generic) 1 x Per Day/30 Days Discharge Instructions: Cleanse the wound with Normal Saline prior to applying a clean dressing using gauze sponges, not tissue or cotton balls. Cleanser: Soap and Water 1 x Per Day/30 Days Discharge Instructions: May shower and wash wound with dial antibacterial soap and water prior to dressing change. Peri-Wound Care: Sween Lotion (Moisturizing lotion) 1 x Per Day/30 Days Discharge Instructions: Apply moisturizing lotion as directed Prim Dressing: Maxorb Extra Ag+ Alginate Dressing, 4x4.75 (in/in) 1 x Per Day/30 Days ary Discharge Instructions: Apply to wound bed as instructed Secondary Dressing: Woven Gauze Sponges 2x2 in (Generic) 1 x Per Day/30 Days Discharge Instructions: Apply over primary dressing as directed. Secured With: Insurance underwriter, Sterile 2x75 (in/in) (Generic) 1 x Per Day/30 Days Discharge Instructions: Secure with stretch gauze as directed. Secured With: 666M Medipore H Soft Cloth Surgical T ape, 4 x 10 (in/yd) (Generic) 1 x Per Day/30 Days Discharge Instructions: Secure with tape as directed. 09/16/2023: Both wounds are smaller today. There is some callus surrounding each of them. No concern for infection. I  used a curette to debride callus, slough, and subcutaneous tissue from the wounds. We will continue silver alginate to both. Continue forefoot offloading sandal. He will follow-up in 3 weeks, due to clinic scheduling. Electronic Signature(s) Signed: 09/16/2023 8:08:02 AM By: [name] MD FACS Entered By: [name] on 09/16/2023 05:08:02 -------------------------------------------------------------------------------- HxROS Details Patient Name: Date of Service: [name], [name] 09/16/2023 7:30 A M Medical Record Number: [id number] Patient Account Number: [id number] Date of Birth/Sex: Treating RN: [birth year] (72 y.o. M) Primary Care Provider: [name] Other Clinician: Referring Provider: Treating Provider/Extender: [name] in Treatment: 28 Information Obtained From Patient Eyes Medical History: Past Medical History Notes: Diabetic retinopathy Cardiovascular Medical History: Positive for: Hypertension Past Medical History Notes: Carotid artery stenosis Endocrine Medical History: Positive for: Type II Diabetes Negative for: Type I Diabetes Time with diabetes: 5 years Treated with: Insulin Blood sugar tested every day: Yes Tested : twice a day Musculoskeletal Medical History: Positive for: Osteoarthritis TEAL, OUELLETTE ([id number]) [id number] Page 12 of 13 Neurologic Medical History: Positive for: Neuropathy Oncologic Medical History: Positive for: Received Radiation Past Medical History Notes: Prostate cancer Immunizations Pneumococcal Vaccine: Received Pneumococcal Vaccination: No Implantable Devices None Family and Social History Cancer: No; Diabetes: Yes - Mother,Siblings; Heart Disease: No; Hereditary Spherocytosis:  No; Hypertension: No; Kidney Disease: No; Lung Disease: No; Seizures: No; Stroke: No; Thyroid Problems: No; Tuberculosis: No; Never smoker; Marital Status - Single;  Alcohol Use: Never; Drug Use: No History; Caffeine Use: Rarely; Financial Concerns: No; Food, Clothing or Shelter Needs: No; Support System Lacking: No; Transportation Concerns: No Electronic Signature(s) Signed: 09/16/2023 8:12:00 AM By: [name] MD FACS Entered By: [name] on 09/16/2023 04:59:24 -------------------------------------------------------------------------------- SuperBill Details Patient Name: Date of Service: [name], [name] 09/16/2023 Medical Record Number: [id number] Patient Account Number: [id number] Date of Birth/Sex: Treating RN: [birth year] (72 y.o. M) Primary Care Provider: [name] Other Clinician: Referring Provider: Treating Provider/Extender: [name]: 28 Diagnosis Coding ICD-10 Codes Code Description [phone] Non-pressure chronic ulcer of other part of right foot with necrosis of bone L97.512 Non-pressure chronic ulcer of other part of right foot with fat layer exposed M86.171 Other acute osteomyelitis, right ankle and foot E11.621 Type 2 diabetes mellitus with foot ulcer I10 Essential (primary) hypertension N18.32 Chronic kidney disease, stage 3b Facility Procedures : CPT4 Code: [id number] Description: 11042 - DEB SUBQ TISSUE 20 SQ CM/< ICD-10 Diagnosis Description L97.514 Non-pressure chronic ulcer of other part of right foot with necrosis of bone L97.512 Non-pressure chronic ulcer of other part of right foot with fat layer exposed Modifier: Quantity: 1 Physician Procedures : CPT4 Code Description Modifier [id number] - WC PHYS LEVEL 3 - EST PT ICD-10 Diagnosis Description L97.514 Non-pressure chronic ulcer of other part of right foot with necrosis of bone Wolfgang, Dayshawn ([id number]) [id number]  L97.512 Non-pressure chronic ulcer of other part of right foot with fat layer exposed M86.171 Other acute osteomyelitis, right ankle and foot E11.621 Type 2 diabetes mellitus  with foot ulcer Quantity: 1 227.pdf Page 13 of 13 : [id number] 11042 - WC PHYS SUBQ TISS 20 SQ CM ICD-10 Diagnosis Description L97.514 Non-pressure chronic ulcer of other part of right foot with necrosis of bone L97.512 Non-pressure 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

## 2023-09-22 ENCOUNTER — Encounter: Payer: Self-pay | Admitting: Family Medicine

## 2023-09-30 ENCOUNTER — Ambulatory Visit: Payer: Medicare HMO

## 2023-10-02 ENCOUNTER — Ambulatory Visit (INDEPENDENT_AMBULATORY_CARE_PROVIDER_SITE_OTHER): Payer: Medicare HMO

## 2023-10-02 VITALS — BP 172/94

## 2023-10-02 DIAGNOSIS — I10 Essential (primary) hypertension: Secondary | ICD-10-CM

## 2023-10-02 DIAGNOSIS — Z794 Long term (current) use of insulin: Secondary | ICD-10-CM | POA: Diagnosis not present

## 2023-10-02 DIAGNOSIS — E1142 Type 2 diabetes mellitus with diabetic polyneuropathy: Secondary | ICD-10-CM

## 2023-10-02 NOTE — Progress Notes (Signed)
10/02/2023 Name: [name] MRN: [id number] DOB: 09/01/1951  Chief Complaint  Patient presents with   Hypertension   Diabetes    [name] is a 72 y.o. year old male who was referred for medication management by their primary care provider, [name] Saint, MD. They presented for a face to face visit today.   They were referred to the pharmacist by their PCP for assistance in managing diabetes, hypertension, and complex medication management    Subjective:  Care Team: Primary Care Provider: Deeann Saint, MD ; 11/01/22  Medication Access/Adherence  Current Pharmacy:  [facility] [phone] - [name], [place] - [address] [place] [address] Phone: [phone] Fax: [phone]  [facility] [place] (New Address) - [place], [place] - [address] [facility] AT Previously: [name], [place] [address] [facility] [address] Floor [address] [place] [address] Phone: [phone] Fax: [phone]  [facility] - [place], [place] - [address] [place] [address] Phone: [phone] Fax: [phone]   Patient reports affordability concerns with their medications: No  Patient reports access/transportation concerns to their pharmacy: No  Patient reports adherence concerns with their medications:  No   Diabetes:  Current medications: Novolin Kwikpen 20 units qam and 24 units qpm Medications tried in the past: [place] (pt not sure he actually took the medication)  Current glucose readings: ranging from 95-304 over the last 2 weeks, all after a meal. Patient states sometimes he is not waiting 2 hours post-prandial to check sugars but could not tell me which readings those correlated too   Patient denies hypoglycemic s/sx including dizziness, shakiness, sweating. Patient denies hyperglycemic symptoms including polyuria, polydipsia, polyphagia,  nocturia, neuropathy, blurred vision.   Current medication access support: None  Hypertension:  Current medications: Amlodipine 5mg , Carvedilol 25mg  BID, Doxazosin 8mg , Hydralazine 50mg  BID Medications previously tried: Diltiazem, Metoprolol, Losartan, Lisinopril, Triamterene/HCTZ  Patient has a validated, automated, upper arm home BP cuff Current blood pressure readings: 145/82 (left arm today), 172/94 (right arm today) Log of readings shows an avg home reading of 128/72 over last 2 weeks. Most readings below 140/90, occasional elevations. Patient does report checking in both arms and noticing a difference in readings of >20  for SBP (checking within min of each other). Of note, this also occurred in office today. Notified PCP  Patient reports hypotensive s/sx including dizziness, lightheadedness.  Patient denies hypertensive symptoms including headache, chest pain, shortness of breath   Objective:  Lab Results  Component Value Date   HGBA1C 7.4 (A) 08/31/2023    Lab Results  Component Value Date   CREATININE 2.49 (H) 08/31/2023   BUN 33 (H) 08/31/2023   NA 138 08/31/2023   K 5.0 08/31/2023   CL 102 08/31/2023   CO2 30 08/31/2023    Lab Results  Component Value Date   CHOL 214 (H) 01/23/2023   HDL 52 01/23/2023   LDLCALC 143 (H) 01/23/2023   LDLDIRECT 98 09/04/2022   TRIG 97 01/23/2023   CHOLHDL 4.1 01/23/2023    Medications Reviewed Today     Reviewed by [name], RPH (Pharmacist) on 10/02/23 at 1422  Med List Status: <None>   Medication Order Taking? Sig Documenting Provider Last Dose Status Informant  amLODipine (NORVASC) 10 MG tablet [id number] Yes TAKE 1 TABLET BY MOUTH EVERY DAY [name] Saint, MD Taking Active  Med Note [name], Miel Wisener H   Fri Oct 02, 2023  2:22 PM) 1/2 tab by mouth daily  aspirin EC 81 MG tablet [id number] Yes Take 1 tablet (81 mg total) by mouth daily. Swallow whole. [name] Saint, MD Taking Active   atorvastatin  (LIPITOR) 80 MG tablet [id number] Yes Take 1 tablet (80 mg total) by mouth daily. [name] Saint, MD Taking Active   blood glucose meter kit and supplies [id number]  Dispense based on patient and insurance preference. Use to check glucose daily (FOR ICD-10 E10.9, E11.9). [name] Coombe, DO  Active   Blood Glucose Monitoring Suppl (ONE TOUCH ULTRA 2) w/Device KIT [id number]  Use to check blood sugars twice daily as directed. Dx E11.9 [name] Saint, MD  Active   carvedilol (COREG) 25 MG tablet [id number] Yes TAKE 1 TABLET BY MOUTH TWICE DAILY [name] Sorrow, NP Taking Active   doxazosin (CARDURA) 8 MG tablet [id number] Yes Take 1 tablet (8 mg total) by mouth at bedtime. [name] Saint, MD Taking Active   ferrous gluconate (FERGON) 324 MG tablet [id number] Yes Take 1 tablet (324 mg total) by mouth daily with breakfast. [name] Saint, MD Taking Active   glucose blood (ONETOUCH ULTRA TEST) test strip [id number]  Use to check blood sugar twice daily as directed E11.9 [name] Saint, MD  Active   hydrALAZINE (APRESOLINE) 50 MG tablet [id number] Yes Take 1 tablet (50 mg total) by mouth in the morning and at bedtime. [name] Saint, MD Taking Active   insulin isophane & regular human KwikPen (NOVOLIN 70/30 KWIKPEN) (70-30) 100 UNIT/ML KwikPen [id number] Yes Inject 20 Units into the skin daily before breakfast AND 24 Units at bedtime. [name] Saint, MD Taking Active   Insulin Pen Needle (BD PEN NEEDLE NANO 2ND GEN) 32G X 4 MM MISC [id number]  To use with insulin pen twice a day. [name] Saint, MD  Active   Lancet Devices (ONE TOUCH DELICA LANCING DEV) MISC [id number]  1 Device by Does not apply route as directed. [name], [name] Dolores, MD  Active   Lancets [name] [place] PLUS [place]) MISC [id number]  Use 1 each to test sugars twice daily as directed E11.9 [name] Saint, MD  Active   latanoprost (XALATAN) 0.005 % ophthalmic solution [id number]  SMARTSIG:In Eye(s) [provider]   Active               Assessment/Plan:   Diabetes: - Currently uncontrolled - Reviewed long term cardiovascular and renal outcomes of uncontrolled blood sugar - Reviewed goal A1c, goal fasting, and goal 2 hour post prandial glucose - Reviewed dietary modifications including low carb diet  - Recommend to check glucose BID, fasting and post-prandial, provided blood glucose log      Hypertension: - Currently mostly controlled per home readings but uncontrolled in office today -Follow up with PCP, aware of SBP differences per arm, PCP concerned of plaque buildup, will address with patient at next f/u on 11/01/22 - Reviewed long term cardiovascular and renal outcomes of uncontrolled blood pressure - Reviewed appropriate blood pressure monitoring technique and reviewed goal blood pressure. Recommended to check home blood pressure and heart rate daily -Continue current medication therapy   Follow Up Plan: PCP in 1 month, Pharmacist in 2 months  [name], PharmD Clinical Pharmacist [phone]

## 2023-10-14 ENCOUNTER — Encounter (HOSPITAL_BASED_OUTPATIENT_CLINIC_OR_DEPARTMENT_OTHER): Payer: Medicare HMO | Attending: General Surgery | Admitting: General Surgery

## 2023-10-14 DIAGNOSIS — E1122 Type 2 diabetes mellitus with diabetic chronic kidney disease: Secondary | ICD-10-CM | POA: Diagnosis not present

## 2023-10-14 DIAGNOSIS — N1832 Chronic kidney disease, stage 3b: Secondary | ICD-10-CM | POA: Insufficient documentation

## 2023-10-14 DIAGNOSIS — E1140 Type 2 diabetes mellitus with diabetic neuropathy, unspecified: Secondary | ICD-10-CM | POA: Insufficient documentation

## 2023-10-14 DIAGNOSIS — L97514 Non-pressure chronic ulcer of other part of right foot with necrosis of bone: Secondary | ICD-10-CM | POA: Diagnosis not present

## 2023-10-14 DIAGNOSIS — E11621 Type 2 diabetes mellitus with foot ulcer: Secondary | ICD-10-CM | POA: Diagnosis not present

## 2023-10-14 DIAGNOSIS — M1990 Unspecified osteoarthritis, unspecified site: Secondary | ICD-10-CM | POA: Insufficient documentation

## 2023-10-14 DIAGNOSIS — E1151 Type 2 diabetes mellitus with diabetic peripheral angiopathy without gangrene: Secondary | ICD-10-CM | POA: Diagnosis not present

## 2023-10-14 DIAGNOSIS — I129 Hypertensive chronic kidney disease with stage 1 through stage 4 chronic kidney disease, or unspecified chronic kidney disease: Secondary | ICD-10-CM | POA: Insufficient documentation

## 2023-10-14 DIAGNOSIS — M86171 Other acute osteomyelitis, right ankle and foot: Secondary | ICD-10-CM | POA: Insufficient documentation

## 2023-10-15 NOTE — Progress Notes (Signed)
LOREN, APRAHAMIAN ([id number]) [id number] Page 1 of 8 Visit Report for 10/14/2023 Chief Complaint Document Details Patient Name: Date of Service: [name] 10/14/2023 7:30 [name] M Medical Record Number: [id number] Patient Account Number: [id number] Date of Birth/Sex: Treating RN: 09-19-1951 (72 y.o. M) Primary Care Provider: [name] Other Clinician: Referring Provider: Treating Provider/Extender: [name] in Treatment: 32 Information Obtained from: Patient Chief Complaint Bilateral T Ulcers oe 03/02/2023: right great toe ulcer with osteomyelitis Electronic Signature(s) Signed: 10/14/2023 8:11:50 AM By: [name] MD FACS Entered By: [name] on 10/14/2023 07:56:36 -------------------------------------------------------------------------------- HPI Details Patient Name: Date of Service: [name], [name] 10/14/2023 7:30 [name] M Medical Record Number: [id number] Patient Account Number: [id number] Date of Birth/Sex: Treating RN: 04/07/1951 (72 y.o. M) Primary Care Provider: [name] Other Clinician: Referring Provider: Treating Provider/Extender: [name] in Treatment: 32 History of Present Illness HPI Description: 09/26/2020 on evaluation today patient appears to be doing somewhat poorly in regard to his bilateral feet at this point due to issues that he is been having that developed about 2 weeks ago. He was walking home which was about [name] mile and states that he first noted the areas on the right foot beginning. These were blisters that develop. The left started Thanksgiving day when he was up standing more cooking. He did see his primary care provider who referred him to podiatry. He saw Dr. Allena Katz and Dr. Allena Katz recommended Betadine moistened gauze dressings to the wounds and to follow-up in 2 weeks. With that being said the patient subsequently was concerned about infection  2 to 3 days later as was his family member who is with him today. I believe this was [name] sister. With that being said subsequently he ended up going to urgent care at that point he was given Diflucan and Keflex he is done with both at this time. He is could be canceling the appointment with Dr. Allena Katz they tell me at this point. His most recent hemoglobin A1c was 6.6 on November 12. His ABIs were 1.2 on the right and 1.03 on the left and appear to be doing excellent. With that being said he does not have diabetic shoes and I feel like the shoes were likely the culprit for what led to this issue currently. There does not appear to be any signs of systemic infection nor local infection at this point which is great news. The patient does have [name] history of hypertension as well as potentially [name] recurrence of prostate cancer he is being followed by the cancer center for that at this point 10/03/2020 upon evaluation today patient appears to be doing better in regard to his toe ulcers. He has been tolerating the dressing changes without complication. Fortunately there is no signs of active infection at this time. The right great toe and fifth toe are the 2 worst out of everything here he has [name] couple that have healed on the left foot there is really nothing remaining open though he has several areas of callus on the left foot in fact 3 on the third fourth and fifth toes. That is going need to be removed today as well. 10/10/2020 upon evaluation today patient appears to be doing well in regard to his wounds. He still has wounds open on the first, second, and fifth toes of the right foot. Left foot is completely healed and the right fourth toe is also completely healed today. Overall very pleased with how things seem  to be progressing. 10/17/2020 on evaluation today patient appears to be doing much better in regard to the right first, second, and fifth toes. He is making excellent progress and to be honest I am  extremely pleased with where things stand today. Fortunately there is no signs of active infection at this time. No fevers, chills, nausea, vomiting, or diarrhea. 11/07/2020 upon evaluation today patient appears to actually be doing excellent in regard to his toe ulcers. The fifth and first toe on the right foot are what is left at this point. There does not appear to be signs of infection currently which is great news. With that being said I did perform some sharp debridement today to remove some of the callus around the edges of the wound 11/14/2020 upon evaluation today patient appears to be doing excellent in regard to his toe ulcers. In fact he is almost completely healed he just has 1 area on [place], [name] ([id number]) [id number] Page 2 of 8 the great toe remaining at this time and this is very small. Overall he is extremely pleased with where things stand as MI. 11/21/2020 upon evaluation today patient appears to be doing well with regard to his foot ulcer. In fact this appears to be completely healed on the great toe which was the last of the areas of concern. Fortunately there is no signs of active infection at this time. No fevers, chills, nausea, vomiting, or diarrhea. READMISSION 03/02/2023 This is [name] now 72 year old type II diabetic (last hemoglobin A1c 7.9%) who returns with [name] right great toe ulcer. On February 12, 2023, he was seen by podiatry who noted: "He has superficial wounds to the second and fourth toes of the right foot however the hallux does not straight [name] very large bulbous macerated mass of tissue including the nail plate the tuft of the toe. Once this was attempted to be debrided is noted that there was [name] foul odor no purulence no significant cellulitic process but bone was visible through the wound. There is gray necrotic tissue beneath the macerated cutis." Plain radiographs taken that day demonstrated breakdown of the distal phalanx, gas in the  tissue of the toe. [name] stat MRI was subsequently ordered. Those results are copied here: IMPRESSION: 1. Osteomyelitis of the distal phalanx great toe with overlying ulceration and speckled gas in the soft tissues of the great toe. 2. Mild dorsal subcutaneous edema in the forefoot, cellulitis is not excluded by imaging. 3. Accentuated T2 signal dorsally over the second and third toes, conceivably from bandaging or blistering, correlate with visual inspection. 4. Small focus of metal artifact along the plantar soft tissues of the forefoot below the third metatarsal head, not visible on recent conventional radiographs, probably [name] microscopic metallic particle. He was started on Augmentin for 10-day course. He apparently requested [name] second opinion regarding the osteomyelitis and need for amputation and saw Dr. Allena Katz on May 1. Dr. Eliane Decree note is incomplete at this time so it is not clear what was discussed. I do not see any formal vascular studies in the patient's chart. ABI in clinic today was noncompressible, but he has easily palpable distal pulses. 03/09/2023 He has [name] new ulcer that has opened up on the tip of his second toe. This is limited to the fat layer. There is [name] little bit of slough on the surface. The great toe ulcer has continued presence of necrotic muscle and subcutaneous tissue with bone exposure. He is taking Augmentin and doxycycline. According to his insurance, no  preapproval is necessary for hyperbaric oxygen therapy, but they cannot guarantee it will be paid for. 03/17/2023: There is [name] bit of maceration of the skin between his great and second toes, but no frank tissue breakdown. The ulcer on the tip of his second toe is [name] little bit smaller. The great toe ulcer is cleaner. He has received approval for hyperbaric oxygen therapy. 04/02/2023: The second toe is healed. The ulcer on the tip of his great toe has hypertrophic granulation tissue. Bone is still palpable in the center. There  is some accumulation of callus and senescent skin around the wound margins. We have been trying to get in touch with him to schedule him for initiation of hyperbaric oxygen therapy. He has been approved for Epicord, but we do not have 1 here for him today. 04/09/2023: The ulcer on the tip of his great toe has filled in with more granulation tissue. It does still probe to bone, but appears much healthier. He started hyperbaric oxygen therapy on Monday. We have been struggling [name] little bit with hypoglycemia during and after his treatments, but we are working on [name] resolution to that. We are going to apply Epicord no. 1 today. 04/17/2023: There has been substantial infill of granulation tissue. I am no longer able to probe to bone. He is doing well with his hyperbaric oxygen therapy and we have not experienced any further episodes of hypoglycemia. 04/23/2023: His wound is responding beautifully to both the skin substitute and hyperbaric oxygen therapy. Current measurements:1.4x2.4x0.1cm. There is just slight slough on the surface and bone is completely covered with [name] good layer of healthy tissue. 04/29/2023: The wound continues to improve. There is some periwound callus buildup as well as thin slough on the wound surface. Measurements L x W x D (cm) 1.4x1.8x0.1 Area (cm): 1.979 % Reduction in Area: 82.90% . % Reduction in Volume: 91.40% 05/07/2023: The wound is smaller again today. There is [name] little bit of callus accumulation around the margins with some slough on the surface. Excellent coverage of the bone with healthy-looking granulation tissue. Wound measurements: 0.6x1.2x0.1 05/14/2023: The wound is smaller again today. There is callus accumulation around the edges with [name] little bit of slough on the surface. Measurements L x W x D (cm) 0.5x1.2x0.2 Area (cm): 0.471 Volume (cm) : 0.094 % Reduction in Area: 95.90% % Reduction in Volume: 95.90% 05/21/2023: He says that he was on his feet most of Saturday  and his foot got wet. There is tissue breakdown secondary to moisture on the plantar aspect of his toe. There is gray-green slough and callus accumulation. 05/28/2023: The wound looks better today. There is still some moisture related tissue breakdown that has progressed on the plantar aspect of his toe, but I think this is residual from last week, as there is no active tissue maceration. Measurements L x W x D (cm) 0.8x1x0. Area (cm): 0.628 Volume (cm) : 0.188 % Reduction in Area: 94.60% % Reduction in Volume: 91.90% 06/05/2023: The wound has improved considerably over the past week. There has been substantial epithelialization. There is some callus accumulation around the edges and light slough on the surface. 06/12/2023: The wound continues to improve. It is down to less than half [name] centimeter in greatest dimension and is completely flush with the surrounding tissue. There is some dry skin and callus accumulation around the edges with light slough on the surface. Measurements L x W x D (cm) 0.3x0.5x0.1 Area (cm): 0.118 Volume (cm) : 0.012 % Reduction  in Area: 99.00% % Reduction in Volume: 99.50% 06/19/2023: The wound measured the same this week, but visually it appears smaller. There is some surrounding callus and dry skin but the surface looks robust and healthy. KOLSON, SARAFIN ([id number]) [id number] Page 3 of 8 06/26/2023: Unfortunately, there has been massive breakdown of his wound. Although the dressing did not have any drainage on it, there was extensive fluid and blood that collected underneath the periwound callus that has resulted in substantial deterioration. The bone remains covered with soft tissue, but the wound is markedly larger. There appears to be some pressure-induced tissue injury on the weightbearing aspect of the toe. 07/06/2023: The culture that I took at his last visit was positive for methicillin-resistant Staph aureus with tetracycline  resistance. I prescribed [name] course of Bactrim and he is currently taking this. His wound is markedly improved. No evidence of pressure-induced tissue injury. There is some callus accumulation around the wound edges and some slough on the surface. 07/13/2023: The wound continues to improve. There is [name] little bit of callus accumulation around the edges and the skin is starting to roll inward, but the surface is clean and the dimensions have decreased. 07/20/2023: The wound measured smaller again today. It is fairly superficial at this point. It is [name] little bit on the dry side. 07/29/2023: The wound is smaller again this week. There is [name] little bit of callus accumulation around the edges and very minimal slough on the surface. 08/05/2023: The wound continues to contract, albeit slowly. There is some callus and dry skin accumulation around the edges and some light slough on the surface. 08/19/2023: T oday, the main area of the wound is quite small and superficial. There is some discoloration, however, underneath callus near the lateral aspect of the tip of his toe. It appears that moisture has resulted in tissue breakdown and this area is open. 09/02/2023: The main area of the wound has healed. The callus near the lateral aspect of the tip of his toe has lifted and the ulcer underneath persists. He has also managed to rub [name] blister on the medial aspect of his right second toe, presumably from rubbing against the great toe. Under the blister, the fat layer is exposed. 09/16/2023: Both wounds are smaller today. There is some callus surrounding each of them. No concern for infection. 10/14/2023: His wounds are healed. Electronic Signature(s) Signed: 10/14/2023 8:11:50 AM By: [name] MD FACS Entered By: [name] on 10/14/2023 07:56:51 -------------------------------------------------------------------------------- Physical Exam Details Patient Name: Date of Service: [name], [name]  10/14/2023 7:30 [name] M Medical Record Number: [id number] Patient Account Number: [id number] Date of Birth/Sex: Treating RN: 1951-03-31 (72 y.o. M) Primary Care Provider: [name] Other Clinician: Referring Provider: Treating Provider/Extender: [name]: 32 Constitutional Hypertensive, asymptomatic. . . . no acute distress. Respiratory Normal work of breathing on room air.. Notes 10/14/2023: His wounds are healed. Electronic Signature(s) Signed: 10/14/2023 8:11:50 AM By: [name] MD FACS Entered By: [name] on 10/14/2023 07:57:26 -------------------------------------------------------------------------------- Physician Orders Details Patient Name: Date of Service: [name], [name] 10/14/2023 7:30 [name] M Medical Record Number: [id number] Patient Account Number: [id number] Date of Birth/Sex: Treating RN: 01/03/1951 (72 y.o. [name] Primary Care Provider: [name] Other Clinician: Referring Provider: Treating Provider/Extender: [name] [place], [place] ([id number]) [phone].pdf Page 4 of 8 Weeks in Treatment: 32 The following information was scribed by: [name] The information was scribed for: [name] Verbal / Phone  Orders: No Diagnosis Coding ICD-10 Coding Code Description L97.514 Non-pressure chronic ulcer of other part of right foot with necrosis of bone L97.512 Non-pressure chronic ulcer of other part of right foot with fat layer exposed M86.171 Other acute osteomyelitis, right ankle and foot E11.621 Type 2 diabetes mellitus with foot ulcer I10 Essential (primary) hypertension N18.32 Chronic kidney disease, stage 3b Discharge From [facility] Discharge from [facility] Bathing/ Shower/ Hygiene May shower and wash wound with soap and water. Additional Orders / Instructions Follow Nutritious Diet Other: Non Wound  Condition pply the following to affected area as directed: - moisturizing lotion to both feet daily [name] Electronic Signature(s) Signed: 10/14/2023 8:11:50 AM By: [name] MD FACS Signed: 10/14/2023 5:22:11 PM By: [name] RN, BSN Entered By: [name] on 10/14/2023 07:58:19 -------------------------------------------------------------------------------- Problem List Details Patient Name: Date of Service: [name], [name] 10/14/2023 7:30 [name] M Medical Record Number: [id number] Patient Account Number: [id number] Date of Birth/Sex: Treating RN: 06-26-1951 (72 y.o. [name] Primary Care Provider: [name] Other Clinician: Referring Provider: Treating Provider/Extender: [name] in Treatment: 32 Active Problems ICD-10 Encounter Code Description Active Date MDM Diagnosis L97.514 Non-pressure chronic ulcer of other part of right foot with necrosis of bone 03/02/2023 No Yes L97.512 Non-pressure chronic ulcer of other part of right foot with fat layer exposed 09/02/2023 No Yes M86.171 Other acute osteomyelitis, right ankle and foot 03/02/2023 No Yes E11.621 Type 2 diabetes mellitus with foot ulcer 03/02/2023 No Yes I10 Essential (primary) hypertension 03/02/2023 No Yes Blash, Niki ([id number]) [id number] Page 5 of 8 N18.32 Chronic kidney disease, stage 3b 03/02/2023 No Yes Inactive Problems Resolved Problems ICD-10 Code Description Active Date Resolved Date L97.512 Non-pressure chronic ulcer of other part of right foot with fat layer exposed 03/09/2023 03/09/2023 Electronic Signature(s) Signed: 10/14/2023 8:11:50 AM By: [name] MD FACS Entered By: [name] on 10/14/2023 07:56:21 -------------------------------------------------------------------------------- Progress Note Details Patient Name: Date of Service: [name], [name] 10/14/2023 7:30 [name] M Medical Record Number:  [id number] Patient Account Number: [id number] Date of Birth/Sex: Treating RN: Jul 18, 1951 (72 y.o. M) Primary Care Provider: [name] Other Clinician: Referring Provider: Treating Provider/Extender: [name] in Treatment: 32 Subjective Chief Complaint Information obtained from Patient Bilateral T Ulcers 03/02/2023: right great toe ulcer with osteomyelitis oe History of Present Illness (HPI) 09/26/2020 on evaluation today patient appears to be doing somewhat poorly in regard to his bilateral feet at this point due to issues that he is been having that developed about 2 weeks ago. He was walking home which was about [name] mile and states that he first noted the areas on the right foot beginning. These were blisters that develop. The left started Thanksgiving day when he was up standing more cooking. He did see his primary care provider who referred him to podiatry. He saw Dr. Allena Katz and Dr. Allena Katz recommended Betadine moistened gauze dressings to the wounds and to follow-up in 2 weeks. With that being said the patient subsequently was concerned about infection 2 to 3 days later as was his family member who is with him today. I believe this was [name] sister. With that being said subsequently he ended up going to urgent care at that point he was given Diflucan and Keflex he is done with both at this time. He is could be canceling the appointment with Dr. Allena Katz they tell me at this point. His most recent hemoglobin A1c was 6.6 on November 12. His ABIs  were 1.2 on the right and 1.03 on the left and appear to be doing excellent. With that being said he does not have diabetic shoes and I feel like the shoes were likely the culprit for what led to this issue currently. There does not appear to be any signs of systemic infection nor local infection at this point which is great news. The patient does have [name] history of hypertension as well as potentially [name] recurrence of prostate cancer  he is being followed by the cancer center for that at this point 10/03/2020 upon evaluation today patient appears to be doing better in regard to his toe ulcers. He has been tolerating the dressing changes without complication. Fortunately there is no signs of active infection at this time. The right great toe and fifth toe are the 2 worst out of everything here he has [name] couple that have healed on the left foot there is really nothing remaining open though he has several areas of callus on the left foot in fact 3 on the third fourth and fifth toes. That is going need to be removed today as well. 10/10/2020 upon evaluation today patient appears to be doing well in regard to his wounds. He still has wounds open on the first, second, and fifth toes of the right foot. Left foot is completely healed and the right fourth toe is also completely healed today. Overall very pleased with how things seem to be progressing. 10/17/2020 on evaluation today patient appears to be doing much better in regard to the right first, second, and fifth toes. He is making excellent progress and to be honest I am extremely pleased with where things stand today. Fortunately there is no signs of active infection at this time. No fevers, chills, nausea, vomiting, or diarrhea. 11/07/2020 upon evaluation today patient appears to actually be doing excellent in regard to his toe ulcers. The fifth and first toe on the right foot are what is left at this point. There does not appear to be signs of infection currently which is great news. With that being said I did perform some sharp debridement today to remove some of the callus around the edges of the wound 11/14/2020 upon evaluation today patient appears to be doing excellent in regard to his toe ulcers. In fact he is almost completely healed he just has 1 area on the great toe remaining at this time and this is very small. Overall he is extremely pleased with where things stand as  MI. 11/21/2020 upon evaluation today patient appears to be doing well with regard to his foot ulcer. In fact this appears to be completely healed on the great toe which was the last of the areas of concern. Fortunately there is no signs of active infection at this time. No fevers, chills, nausea, vomiting, or diarrhea. SAMAN, CHRZANOWSKI ([id number]) [id number] Page 6 of 8 03/02/2023 This is [name] now 73 year old type II diabetic (last hemoglobin A1c 7.9%) who returns with [name] right great toe ulcer. On February 12, 2023, he was seen by podiatry who noted: "He has superficial wounds to the second and fourth toes of the right foot however the hallux does not straight [name] very large bulbous macerated mass of tissue including the nail plate the tuft of the toe. Once this was attempted to be debrided is noted that there was [name] foul odor no purulence no significant cellulitic process but bone was visible through the wound. There is gray necrotic tissue beneath the macerated cutis."  Plain radiographs taken that day demonstrated breakdown of the distal phalanx, gas in the tissue of the toe. [name] stat MRI was subsequently ordered. Those results are copied here: IMPRESSION: 1. Osteomyelitis of the distal phalanx great toe with overlying ulceration and speckled gas in the soft tissues of the great toe. 2. Mild dorsal subcutaneous edema in the forefoot, cellulitis is not excluded by imaging. 3. Accentuated T2 signal dorsally over the second and third toes, conceivably from bandaging or blistering, correlate with visual inspection. 4. Small focus of metal artifact along the plantar soft tissues of the forefoot below the third metatarsal head, not visible on recent conventional radiographs, probably [name] microscopic metallic particle. He was started on Augmentin for 10-day course. He apparently requested [name] second opinion regarding the osteomyelitis and need for amputation and saw Dr. Allena Katz  on May 1. Dr. Eliane Decree note is incomplete at this time so it is not clear what was discussed. I do not see any formal vascular studies in the patient's chart. ABI in clinic today was noncompressible, but he has easily palpable distal pulses. 03/09/2023 He has [name] new ulcer that has opened up on the tip of his second toe. This is limited to the fat layer. There is [name] little bit of slough on the surface. The great toe ulcer has continued presence of necrotic muscle and subcutaneous tissue with bone exposure. He is taking Augmentin and doxycycline. According to his insurance, no preapproval is necessary for hyperbaric oxygen therapy, but they cannot guarantee it will be paid for. 03/17/2023: There is [name] bit of maceration of the skin between his great and second toes, but no frank tissue breakdown. The ulcer on the tip of his second toe is [name] little bit smaller. The great toe ulcer is cleaner. He has received approval for hyperbaric oxygen therapy. 04/02/2023: The second toe is healed. The ulcer on the tip of his great toe has hypertrophic granulation tissue. Bone is still palpable in the center. There is some accumulation of callus and senescent skin around the wound margins. We have been trying to get in touch with him to schedule him for initiation of hyperbaric oxygen therapy. He has been approved for Epicord, but we do not have 1 here for him today. 04/09/2023: The ulcer on the tip of his great toe has filled in with more granulation tissue. It does still probe to bone, but appears much healthier. He started hyperbaric oxygen therapy on Monday. We have been struggling [name] little bit with hypoglycemia during and after his treatments, but we are working on [name] resolution to that. We are going to apply Epicord no. 1 today. 04/17/2023: There has been substantial infill of granulation tissue. I am no longer able to probe to bone. He is doing well with his hyperbaric oxygen therapy and we have not experienced any further  episodes of hypoglycemia. 04/23/2023: His wound is responding beautifully to both the skin substitute and hyperbaric oxygen therapy. Current measurements:1.4x2.4x0.1cm. There is just slight slough on the surface and bone is completely covered with [name] good layer of healthy tissue. 04/29/2023: The wound continues to improve. There is some periwound callus buildup as well as thin slough on the wound surface. Measurements L x W x D (cm) 1.4x1.8x0.1 Area (cm): 1.979 % Reduction in Area: 82.90% . % Reduction in Volume: 91.40% 05/07/2023: The wound is smaller again today. There is [name] little bit of callus accumulation around the margins with some slough on the surface. Excellent coverage of the bone with  healthy-looking granulation tissue. Wound measurements: 0.6x1.2x0.1 05/14/2023: The wound is smaller again today. There is callus accumulation around the edges with [name] little bit of slough on the surface. Measurements L x W x D (cm) 0.5x1.2x0.2 Area (cm): 0.471 Volume (cm) : 0.094 % Reduction in Area: 95.90% % Reduction in Volume: 95.90% 05/21/2023: He says that he was on his feet most of Saturday and his foot got wet. There is tissue breakdown secondary to moisture on the plantar aspect of his toe. There is gray-green slough and callus accumulation. 05/28/2023: The wound looks better today. There is still some moisture related tissue breakdown that has progressed on the plantar aspect of his toe, but I think this is residual from last week, as there is no active tissue maceration. Measurements L x W x D (cm) 0.8x1x0. Area (cm): 0.628 Volume (cm) : 0.188 % Reduction in Area: 94.60% % Reduction in Volume: 91.90% 06/05/2023: The wound has improved considerably over the past week. There has been substantial epithelialization. There is some callus accumulation around the edges and light slough on the surface. 06/12/2023: The wound continues to improve. It is down to less than half [name] centimeter in greatest dimension  and is completely flush with the surrounding tissue. There is some dry skin and callus accumulation around the edges with light slough on the surface. Measurements L x W x D (cm) 0.3x0.5x0.1 Area (cm): 0.118 Volume (cm) : 0.012 % Reduction in Area: 99.00% % Reduction in Volume: 99.50% 06/19/2023: The wound measured the same this week, but visually it appears smaller. There is some surrounding callus and dry skin but the surface looks robust and healthy. 06/26/2023: Unfortunately, there has been massive breakdown of his wound. Although the dressing did not have any drainage on it, there was extensive fluid and blood that collected underneath the periwound callus that has resulted in substantial deterioration. The bone remains covered with soft tissue, but the wound is markedly larger. There appears to be some pressure-induced tissue injury on the weightbearing aspect of the toe. 07/06/2023: The culture that I took at his last visit was positive for methicillin-resistant Staph aureus with tetracycline resistance. I prescribed [name] course of Bactrim ASIM, ABAD ([id number]) [id number] Page 7 of 8 and he is currently taking this. His wound is markedly improved. No evidence of pressure-induced tissue injury. There is some callus accumulation around the wound edges and some slough on the surface. 07/13/2023: The wound continues to improve. There is [name] little bit of callus accumulation around the edges and the skin is starting to roll inward, but the surface is clean and the dimensions have decreased. 07/20/2023: The wound measured smaller again today. It is fairly superficial at this point. It is [name] little bit on the dry side. 07/29/2023: The wound is smaller again this week. There is [name] little bit of callus accumulation around the edges and very minimal slough on the surface. 08/05/2023: The wound continues to contract, albeit slowly. There is some callus and dry skin accumulation  around the edges and some light slough on the surface. 08/19/2023: T oday, the main area of the wound is quite small and superficial. There is some discoloration, however, underneath callus near the lateral aspect of the tip of his toe. It appears that moisture has resulted in tissue breakdown and this area is open. 09/02/2023: The main area of the wound has healed. The callus near the lateral aspect of the tip of his toe has lifted and the ulcer underneath persists. He has  also managed to rub [name] blister on the medial aspect of his right second toe, presumably from rubbing against the great toe. Under the blister, the fat layer is exposed. 09/16/2023: Both wounds are smaller today. There is some callus surrounding each of them. No concern for infection. 10/14/2023: His wounds are healed. Objective Constitutional Hypertensive, asymptomatic. no acute distress. Vitals Time Taken: 7:44 AM, Height: 67 in, Weight: 154 lbs, BMI: 24.1, Temperature: 97.6 F, Pulse: 73 bpm, Respiratory Rate: 18 breaths/min, Blood Pressure: 176/86 mmHg, Capillary Blood Glucose: 180 mg/dl. Respiratory Normal work of breathing on room air.. General Notes: 10/14/2023: His wounds are healed. Integumentary (Hair, Skin) Wound #10 status is Healed - Epithelialized. Original cause of wound was Blister. The date acquired was: 09/02/2023. The wound has been in treatment 6 weeks. The wound is located on the Right T Second. The wound measures 0cm length x 0cm width x 0cm depth; 0cm^2 area and 0cm^3 volume. There is no oe tunneling or undermining noted. There is [name] none present amount of drainage noted. There is no granulation within the wound bed. There is no necrotic tissue within the wound bed. The periwound skin appearance had no abnormalities noted for texture. The periwound skin appearance had no abnormalities noted for color. The periwound skin appearance exhibited: Dry/Scaly. Periwound temperature was noted as No  Abnormality. Wound #8 status is Healed - Epithelialized. Original cause of wound was Gradually Appeared. The date acquired was: 02/04/2023. The wound has been in treatment 32 weeks. The wound is located on the Right T Great. The wound measures 0cm length x 0cm width x 0cm depth; 0cm^2 area and 0cm^3 volume. oe There is no tunneling or undermining noted. There is [name] none present amount of drainage noted. The wound margin is indistinct and nonvisible. There is no granulation within the wound bed. There is no necrotic tissue within the wound bed. The periwound skin appearance had no abnormalities noted for color. The periwound skin appearance exhibited: Callus, Dry/Scaly. The periwound skin appearance did not exhibit: Maceration. Periwound temperature was noted as No Abnormality. 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 We reviewed the importance of diligent diabetic footcare, including washing and inspecting his feet daily, using [name] rich moisturizer to maintain suppleness in his skin, and regular podiatric care for nail trims. We will discharge him from the wound care center. He may follow-up in the future, should the need arise. ELI, WISSE ([id number]) [id number] Page 8 of 8 Electronic Signature(s) Signed: 10/14/2023 8:11:50 AM By: [name] MD FACS Entered By: [name] on 10/14/2023 07:58:45 -------------------------------------------------------------------------------- SuperBill Details Patient Name: Date of Service: [name], [name] 10/14/2023 Medical Record Number: [id number] Patient Account Number:  [id number] Date of Birth/Sex: Treating RN: 04/08/1951 (72 y.o. M) Primary Care Provider: [name] Other Clinician: Referring Provider: Treating Provider/Extender: [name]: 32 Diagnosis Coding ICD-10 Codes Code Description [phone] Non-pressure chronic ulcer of other part of right foot with necrosis of bone L97.512 Non-pressure chronic ulcer of other part of right foot with fat layer exposed M86.171 Other acute osteomyelitis, right ankle and foot E11.621 Type 2 diabetes mellitus with foot ulcer I10 Essential (primary) hypertension N18.32 Chronic kidney disease, stage 3b Facility  Procedures : CPT4 Code: [id number] Description: 99213 - WOUND CARE VISIT-LEV 3 EST PT Modifier: Quantity: 1 Physician Procedures : CPT4 Code Description Modifier [id number] 99213 - WC PHYS LEVEL 3 - EST PT ICD-10 Diagnosis Description L97.514 Non-pressure chronic ulcer of other part of right foot with necrosis of bone L97.512 Non-pressure chronic ulcer of other part of right foot  with fat layer exposed M86.171 Other acute osteomyelitis, right ankle and foot E11.621 Type 2 diabetes mellitus with foot ulcer Quantity: 1 Electronic Signature(s) Signed: 10/14/2023 8:11:50 AM By: [name] MD FACS Signed: 10/14/2023 5:22:11 PM By: [name] RN, BSN Entered By: [name] on 10/14/2023 08:02:39

## 2023-10-15 NOTE — Progress Notes (Signed)
LYNKON, PELLER ([id number]) [id number] Page 1 of 9 Visit Report for 10/14/2023 Arrival Information Details Patient Name: Date of Service: [name] 10/14/2023 7:30 A M Medical Record Number: [id number] Patient Account Number: [id number] Date of Birth/Sex: Treating RN: [birth year] (72 y.o. [name] Primary Care Bernell Haynie: [name] Other Clinician: Referring Aaliayah Miao: Treating Caylie Sandquist/Extender: [name] in Treatment: 32 Visit Information History Since Last Visit Added or deleted any medications: No Patient Arrived: Ambulatory Any new allergies or adverse reactions: No Arrival Time: 07:44 Had a fall or experienced change in No Accompanied By: self activities of daily living that may affect Transfer Assistance: None risk of falls: Patient Identification Verified: Yes Signs or symptoms of abuse/neglect since last visito No Secondary Verification Process Completed: Yes Hospitalized since last visit: No Patient Requires Transmission-Based Precautions: No Implantable device outside of the clinic excluding No Patient Has Alerts: Yes cellular tissue based products placed in the center Patient Alerts: Patient on Blood Thinner since last visit: Has Dressing in Place as Prescribed: No Pain Present Now: No Electronic Signature(s) Signed: 10/14/2023 5:22:11 PM By: [name] RN, BSN Entered By: [name] on 10/14/2023 07:44:33 -------------------------------------------------------------------------------- Clinic Level of Care Assessment Details Patient Name: Date of Service: [facility] RD, [name] 10/14/2023 7:30 A M Medical Record Number: [id number] Patient Account Number: [id number] Date of Birth/Sex: Treating RN: [birth year] (72 y.o. [name] Primary Care Jacalyn Biggs: [name] Other Clinician: Referring Loman Logan: Treating Eitan Doubleday/Extender: [name] in  Treatment: 32 Clinic Level of Care Assessment Items TOOL 4 Quantity Score []  - 0 Use when only an EandM is performed on FOLLOW-UP visit ASSESSMENTS - Nursing Assessment / Reassessment X- 1 10 Reassessment of Co-morbidities (includes updates in patient status) X- 1 5 Reassessment of Adherence to Treatment Plan ASSESSMENTS - Wound and Skin A ssessment / Reassessment []  - 0 Simple Wound Assessment / Reassessment - one wound X- 1 5 Complex Wound Assessment / Reassessment - multiple wounds []  - 0 Dermatologic / Skin Assessment (not related to wound area) ASSESSMENTS - Focused Assessment []  - 0 Circumferential Edema Measurements - multi extremities []  - 0 Nutritional Assessment / Counseling / Intervention [name], [name] ([id number]) [id number] Page 2 of 9 X- 1 5 Lower Extremity Assessment (monofilament, tuning fork, pulses) []  - 0 Peripheral Arterial Disease Assessment (using hand held doppler) ASSESSMENTS - Ostomy and/or Continence Assessment and Care []  - 0 Incontinence Assessment and Management []  - 0 Ostomy Care Assessment and Management (repouching, etc.) PROCESS - Coordination of Care X - Simple Patient / Family Education for ongoing care 1 15 []  - 0 Complex (extensive) Patient / Family Education for ongoing care X- 1 10 Staff obtains Chiropractor, Records, T Results / Process Orders est []  - 0 Staff telephones HHA, Nursing Homes / Clarify orders / etc []  - 0 Routine Transfer to another Facility (non-emergent condition) []  - 0 Routine Hospital Admission (non-emergent condition) []  - 0 New Admissions / Manufacturing engineer / Ordering NPWT Apligraf, etc. , []  - 0 Emergency Hospital Admission (emergent condition) X- 1 10 Simple Discharge Coordination []  - 0 Complex (extensive) Discharge Coordination PROCESS - Special Needs []  - 0 Pediatric / Minor Patient Management []  - 0 Isolation Patient Management []  - 0 Hearing / Language /  Visual special needs []  - 0 Assessment of Community assistance (transportation, D/C planning, etc.) []  - 0 Additional assistance / Altered mentation []  - 0 Support Surface(s) Assessment (bed, cushion, seat, etc.) INTERVENTIONS - Wound Cleansing / Measurement []  -  0 Simple Wound Cleansing - one wound X- 1 5 Complex Wound Cleansing - multiple wounds X- 1 5 Wound Imaging (photographs - any number of wounds) []  - 0 Wound Tracing (instead of photographs) []  - 0 Simple Wound Measurement - one wound X- 1 5 Complex Wound Measurement - multiple wounds INTERVENTIONS - Wound Dressings []  - 0 Small Wound Dressing one or multiple wounds []  - 0 Medium Wound Dressing one or multiple wounds []  - 0 Large Wound Dressing one or multiple wounds []  - 0 Application of Medications - topical []  - 0 Application of Medications - injection INTERVENTIONS - Miscellaneous []  - 0 External ear exam []  - 0 Specimen Collection (cultures, biopsies, blood, body fluids, etc.) []  - 0 Specimen(s) / Culture(s) sent or taken to Lab for analysis []  - 0 Patient Transfer (multiple staff / Nurse, adult / Similar devices) []  - 0 Simple Staple / Suture removal (25 or less) []  - 0 Complex Staple / Suture removal (26 or more) []  - 0 Hypo / Hyperglycemic Management (close monitor of Blood Glucose) [name], [name] ([id number]) [id number] Page 3 of 9 []  - 0 Ankle / Brachial Index (ABI) - do not check if billed separately X- 1 5 Vital Signs Has the patient been seen at the hospital within the last three years: Yes Total Score: 80 Level Of Care: New/Established - Level 3 Electronic Signature(s) Signed: 10/14/2023 5:22:11 PM By: [name] RN, BSN Entered By: [name] on 10/14/2023 08:02:28 -------------------------------------------------------------------------------- Encounter Discharge Information Details Patient Name: Date of Service: [name] RD, [name] 10/14/2023 7:30  A M Medical Record Number: [id number] Patient Account Number: [id number] Date of Birth/Sex: Treating RN: February 06, 1951 (72 y.o. [name] Primary Care Magally Vahle: [name] Other Clinician: Referring Shuna Tabor: Treating Ransom Nickson/Extender: [name] in Treatment: 32 Encounter Discharge Information Items Discharge Condition: Stable Ambulatory Status: Ambulatory Discharge Destination: Home Transportation: Private Auto Accompanied By: self Schedule Follow-up Appointment: Yes Clinical Summary of Care: Patient Declined Electronic Signature(s) Signed: 10/14/2023 5:22:11 PM By: [name] RN, BSN Entered By: [name] on 10/14/2023 08:03:06 -------------------------------------------------------------------------------- Lower Extremity Assessment Details Patient Name: Date of Service: [facility] RD, [name] 10/14/2023 7:30 A M Medical Record Number: [id number] Patient Account Number: [id number] Date of Birth/Sex: Treating RN: 09-24-1951 (72 y.o. [name] Primary Care Helaina Stefano: [name] Other Clinician: Referring Dinorah Masullo: Treating Amaan Meyer/Extender: [name]: 32 Edema Assessment Assessed: [Left: No] [Right: No] Edema: [Left: N] [Right: o] Calf Left: Right: Point of Measurement: 33 cm From Medial Instep 34 cm Ankle Left: Right: Point of Measurement: 10 cm From Medial Instep 20.3 cm Vascular Assessment Violet, Josealfredo ([id number]) [[id number] Page 4 of 9] Pulses: Dorsalis Pedis Palpable: [Right:Yes] Extremity colors, hair growth, and conditions: Extremity Color: [Right:Normal] Hair Growth on Extremity: [Right:Yes] Temperature of Extremity: [Right:Warm] Capillary Refill: [Right:< 3 seconds] Dependent Rubor: [Right:No No] Electronic Signature(s) Signed: 10/14/2023 5:22:11 PM By: [name] RN, BSN Entered By: [name] on 10/14/2023  07:46:41 -------------------------------------------------------------------------------- Multi Wound Chart Details Patient Name: Date of Service: [name] RD, [name] 10/14/2023 7:30 A M Medical Record Number: [id number] Patient Account Number: [id number] Date of Birth/Sex: Treating RN: 28-Sep-1951 (72 y.o. M) Primary Care Devine Dant: [name] Other Clinician: Referring Kryslyn Helbig: Treating Alizaya Oshea/Extender: [name] in Treatment: 32 Vital Signs Height(in): 67 Capillary Blood Glucose(mg/dl): 433 Weight(lbs): 295 Pulse(bpm): 73 Body Mass Index(BMI): 24.1 Blood Pressure(mmHg): 176/86 Temperature(F): 97.6 Respiratory Rate(breaths/min): 18 [10:Photos:] [N/A:N/A] Right T Second oe Right T Great oe  N/A Wound Location: Blister Gradually Appeared N/A Wounding Event: Diabetic Wound/Ulcer of the Lower Diabetic Wound/Ulcer of the Lower N/A Primary Etiology: Extremity Extremity Hypertension, Type II Diabetes, Hypertension, Type II Diabetes, N/A Comorbid History: Osteoarthritis, Neuropathy, Received Osteoarthritis, Neuropathy, Received Radiation Radiation 09/02/2023 02/04/2023 N/A Date Acquired: 6 32 N/A Weeks of Treatment: Healed - Epithelialized Healed - Epithelialized N/A Wound Status: No No N/A Wound Recurrence: 0x0x0 0x0x0 N/A Measurements L x W x D (cm) 0 0 N/A A (cm) : rea 0 0 N/A Volume (cm) : 100.00% 100.00% N/A % Reduction in A rea: 100.00% 100.00% N/A % Reduction in Volume: Grade 1 Grade 3 N/A Classification: None Present None Present N/A Exudate A mount: N/A Indistinct, nonvisible N/A Wound Margin: None Present (0%) None Present (0%) N/A Granulation A mount: None Present (0%) None Present (0%) N/A Necrotic A mount: Fascia: No Fascia: No N/A Exposed Structures: Fat Layer (Subcutaneous Tissue): No Fat Layer (Subcutaneous Tissue): No Tendon: No Tendon: No Muscle: No Muscle: No Joint: No Joint: No Bone: No Bone:  No [name], [name] ([id number]) [id number] Page 5 of 9 Large (67-100%) Large (67-100%) N/A Epithelialization: No Abnormalities Noted Callus: Yes N/A Periwound Skin Texture: Dry/Scaly: Yes Dry/Scaly: Yes N/A Periwound Skin Moisture: Maceration: No No Abnormalities Noted No Abnormalities Noted N/A Periwound Skin Color: No Abnormality No Abnormality N/A Temperature: Treatment Notes Electronic Signature(s) Signed: 10/14/2023 8:11:50 AM By: [name] MD FACS Entered By: [name] on 10/14/2023 07:56:31 -------------------------------------------------------------------------------- Multi-Disciplinary Care Plan Details Patient Name: Date of Service: [name] RD, [name] 10/14/2023 7:30 A M Medical Record Number: [id number] Patient Account Number: [id number] Date of Birth/Sex: Treating RN: 1951-03-10 (72 y.o. [name] Primary Care Chene Kasinger: [name] Other Clinician: Referring Valerya Maxton: Treating Eulalio Reamy/Extender: [name] in Treatment: 32 Multidisciplinary Care Plan reviewed with physician Active Inactive Electronic Signature(s) Signed: 10/14/2023 5:22:11 PM By: [name] RN, BSN Entered By: [name] on 10/14/2023 07:54:18 -------------------------------------------------------------------------------- Pain Assessment Details Patient Name: Date of Service: [name] RD, [name] 10/14/2023 7:30 A M Medical Record Number: [id number] Patient Account Number: [id number] Date of Birth/Sex: Treating RN: 26-Aug-1951 (72 y.o. [name] Primary Care Deserea Bordley: [name] Other Clinician: Referring Dhrithi Riche: Treating Sims Laday/Extender: [name] in Treatment: 32 Active Problems Location of Pain Severity and Description of Pain Patient Has Paino No Site Locations Rate the pain. TARVARES, RENS ([id number]) [id number] Page 6 of  9 Rate the pain. Current Pain Level: 0 Pain Management and Medication Current Pain Management: Electronic Signature(s) Signed: 10/14/2023 5:22:11 PM By: [name] RN, BSN Entered By: [name] on 10/14/2023 07:45:07 -------------------------------------------------------------------------------- Patient/Caregiver Education Details Patient Name: Date of Service: [name] RD, [name] 12/18/2024andnbsp7:30 A M Medical Record Number: [id number] Patient Account Number: [id number] Date of Birth/Gender: Treating RN: 09/12/1951 (72 y.o. [name] Primary Care Physician: [name] Other Clinician: Referring Physician: Treating Physician/Extender: [name] in Treatment: 32 Education Assessment Education Provided To: Patient Education Topics Provided Offloading: Methods: Explain/Verbal Responses: Reinforcements needed, State content correctly Peripheral Neuropathy: Methods: Explain/Verbal Responses: Reinforcements needed, State content correctly Wound/Skin Impairment: Methods: Explain/Verbal Responses: Reinforcements needed, State content correctly Electronic Signature(s) Signed: 10/14/2023 5:22:11 PM By: [name] RN, BSN Entered By: [name] on 10/14/2023 07:51:08 [name], [name] ([id number]) [id number] Page 7 of 9 -------------------------------------------------------------------------------- Wound Assessment Details Patient Name: Date of Service: [name] 10/14/2023 7:30 A M Medical Record Number: [id number] Patient Account Number: [id number] Date of Birth/Sex: Treating RN: [birth year] (72 y.o. [name]  Primary Care Borden Thune: [name] Other Clinician: Referring Kiriana Worthington: Treating Jeison Delpilar/Extender: [name] in Treatment: 32 Wound Status Wound Number: 10 Primary Diabetic Wound/Ulcer of the Lower Extremity Etiology: Wound Location:  Right T Second oe Wound Healed - Epithelialized Wounding Event: Blister Status: Date Acquired: 09/02/2023 Comorbid Hypertension, Type II Diabetes, Osteoarthritis, Neuropathy, Weeks Of Treatment: 6 History: Received Radiation Clustered Wound: No Photos Wound Measurements Length: (cm) Width: (cm) Depth: (cm) Area: (cm) Volume: (cm) 0 % Reduction in Area: 100% 0 % Reduction in Volume: 100% 0 Epithelialization: Large (67-100%) 0 Tunneling: No 0 Undermining: No Wound Description Classification: Grade 1 Exudate Amount: None Present Foul Odor After Cleansing: No Slough/Fibrino No Wound Bed Granulation Amount: None Present (0%) Exposed Structure Necrotic Amount: None Present (0%) Fascia Exposed: No Fat Layer (Subcutaneous Tissue) Exposed: No Tendon Exposed: No Muscle Exposed: No Joint Exposed: No Bone Exposed: No Periwound Skin Texture Texture Color No Abnormalities Noted: Yes No Abnormalities Noted: Yes Moisture Temperature / Pain No Abnormalities Noted: No Temperature: No Abnormality Dry / Scaly: Yes Electronic Signature(s) Signed: 10/14/2023 5:22:11 PM By: [name] RN, BSN Entered By: [name] on 10/14/2023 07:49:19 [name], [name] ([id number]) [id number] Page 8 of 9 -------------------------------------------------------------------------------- Wound Assessment Details Patient Name: Date of Service: [name] 10/14/2023 7:30 A M Medical Record Number: [id number] Patient Account Number: [id number] Date of Birth/Sex: Treating RN: 05-05-1951 (72 y.o. [name] Primary Care Atari Novick: [name] Other Clinician: Referring Terryann Verbeek: Treating Kamiyah Kindel/Extender: [name]: 32 Wound Status Wound Number: 8 Primary Diabetic Wound/Ulcer of the Lower Extremity Etiology: Wound Location: Right T Great oe Wound Healed - Epithelialized Wounding Event: Gradually  Appeared Status: Date Acquired: 02/04/2023 Comorbid Hypertension, Type II Diabetes, Osteoarthritis, Neuropathy, Weeks Of Treatment: 32 History: Received Radiation Clustered Wound: No Photos Wound Measurements Length: (cm) Width: (cm) Depth: (cm) Area: (cm) Volume: (cm) 0 % Reduction in Area: 100% 0 % Reduction in Volume: 100% 0 Epithelialization: Large (67-100%) 0 Tunneling: No 0 Undermining: No Wound Description Classification: Grade 3 Wound Margin: Indistinct, nonvisible Exudate Amount: None Present Foul Odor After Cleansing: No Slough/Fibrino No Wound Bed Granulation Amount: None Present (0%) Exposed Structure Necrotic Amount: None Present (0%) Fascia Exposed: No Fat Layer (Subcutaneous Tissue) Exposed: No Tendon Exposed: No Muscle Exposed: No Joint Exposed: No Bone Exposed: No Periwound Skin Texture Texture Color No Abnormalities Noted: No No Abnormalities Noted: Yes Callus: Yes Temperature / Pain Temperature: No Abnormality Moisture No Abnormalities Noted: No Dry / Scaly: Yes Maceration: No Electronic Signature(s) Signed: 10/14/2023 5:22:11 PM By: [name] RN, BSN Entered By: [name] on 10/14/2023 07:55:09 [name], [name] ([id number]) [id number] Page 9 of 9 -------------------------------------------------------------------------------- Vitals Details Patient Name: Date of Service: [name] 10/14/2023 7:30 A M Medical Record Number: [id number] Patient Account Number: [id number] Date of Birth/Sex: Treating RN: 1951/05/23 (72 y.o. [name] Primary Care Cleston Lautner: [name] Other Clinician: Referring Cynthia Cogle: Treating Altonio Schwertner/Extender: [name] in Treatment: 32 Vital Signs Time Taken: 07:44 Temperature (F): 97.6 Height (in): 67 Pulse (bpm): 73 Weight (lbs): 154 Respiratory Rate (breaths/min): 18 Body Mass Index (BMI): 24.1 Blood Pressure (mmHg):  176/86 Capillary Blood Glucose (mg/dl): 355 Reference Range: 80 - 120 mg / dl Electronic Signature(s) Signed: 10/14/2023 5:22:11 PM By: [name] RN, BSN Entered By: [name] on 10/14/2023 07:45:00

## 2023-10-30 DIAGNOSIS — D123 Benign neoplasm of transverse colon: Secondary | ICD-10-CM | POA: Diagnosis not present

## 2023-10-30 DIAGNOSIS — Z09 Encounter for follow-up examination after completed treatment for conditions other than malignant neoplasm: Secondary | ICD-10-CM | POA: Diagnosis not present

## 2023-10-30 DIAGNOSIS — K635 Polyp of colon: Secondary | ICD-10-CM | POA: Diagnosis not present

## 2023-10-30 DIAGNOSIS — K648 Other hemorrhoids: Secondary | ICD-10-CM | POA: Diagnosis not present

## 2023-10-30 DIAGNOSIS — Z860101 Personal history of adenomatous and serrated colon polyps: Secondary | ICD-10-CM | POA: Diagnosis not present

## 2023-10-30 DIAGNOSIS — K6289 Other specified diseases of anus and rectum: Secondary | ICD-10-CM | POA: Diagnosis not present

## 2023-10-30 LAB — HM COLONOSCOPY

## 2023-11-02 ENCOUNTER — Ambulatory Visit: Payer: Medicare HMO | Admitting: Family Medicine

## 2023-11-02 DIAGNOSIS — E785 Hyperlipidemia, unspecified: Secondary | ICD-10-CM | POA: Diagnosis not present

## 2023-11-02 DIAGNOSIS — E1136 Type 2 diabetes mellitus with diabetic cataract: Secondary | ICD-10-CM | POA: Diagnosis not present

## 2023-11-02 DIAGNOSIS — M1990 Unspecified osteoarthritis, unspecified site: Secondary | ICD-10-CM | POA: Diagnosis not present

## 2023-11-02 DIAGNOSIS — K219 Gastro-esophageal reflux disease without esophagitis: Secondary | ICD-10-CM | POA: Diagnosis not present

## 2023-11-02 DIAGNOSIS — I130 Hypertensive heart and chronic kidney disease with heart failure and stage 1 through stage 4 chronic kidney disease, or unspecified chronic kidney disease: Secondary | ICD-10-CM | POA: Diagnosis not present

## 2023-11-02 DIAGNOSIS — N184 Chronic kidney disease, stage 4 (severe): Secondary | ICD-10-CM | POA: Diagnosis not present

## 2023-11-02 DIAGNOSIS — F325 Major depressive disorder, single episode, in full remission: Secondary | ICD-10-CM | POA: Diagnosis not present

## 2023-11-02 DIAGNOSIS — Z794 Long term (current) use of insulin: Secondary | ICD-10-CM | POA: Diagnosis not present

## 2023-11-02 DIAGNOSIS — E1165 Type 2 diabetes mellitus with hyperglycemia: Secondary | ICD-10-CM | POA: Diagnosis not present

## 2023-11-02 DIAGNOSIS — Z008 Encounter for other general examination: Secondary | ICD-10-CM | POA: Diagnosis not present

## 2023-11-02 DIAGNOSIS — E1151 Type 2 diabetes mellitus with diabetic peripheral angiopathy without gangrene: Secondary | ICD-10-CM | POA: Diagnosis not present

## 2023-11-02 DIAGNOSIS — I509 Heart failure, unspecified: Secondary | ICD-10-CM | POA: Diagnosis not present

## 2023-11-02 DIAGNOSIS — E113519 Type 2 diabetes mellitus with proliferative diabetic retinopathy with macular edema, unspecified eye: Secondary | ICD-10-CM | POA: Diagnosis not present

## 2023-11-03 DIAGNOSIS — D123 Benign neoplasm of transverse colon: Secondary | ICD-10-CM | POA: Diagnosis not present

## 2023-11-11 ENCOUNTER — Ambulatory Visit (INDEPENDENT_AMBULATORY_CARE_PROVIDER_SITE_OTHER): Payer: Medicare HMO | Admitting: Family Medicine

## 2023-11-11 ENCOUNTER — Encounter: Payer: Self-pay | Admitting: Family Medicine

## 2023-11-11 VITALS — BP 141/75 | HR 86 | Temp 98.0°F | Resp 18 | Wt 159.0 lb

## 2023-11-11 DIAGNOSIS — E1142 Type 2 diabetes mellitus with diabetic polyneuropathy: Secondary | ICD-10-CM

## 2023-11-11 DIAGNOSIS — E11311 Type 2 diabetes mellitus with unspecified diabetic retinopathy with macular edema: Secondary | ICD-10-CM | POA: Diagnosis not present

## 2023-11-11 DIAGNOSIS — Z794 Long term (current) use of insulin: Secondary | ICD-10-CM | POA: Diagnosis not present

## 2023-11-11 DIAGNOSIS — N184 Chronic kidney disease, stage 4 (severe): Secondary | ICD-10-CM

## 2023-11-11 DIAGNOSIS — E113411 Type 2 diabetes mellitus with severe nonproliferative diabetic retinopathy with macular edema, right eye: Secondary | ICD-10-CM

## 2023-11-11 DIAGNOSIS — I6523 Occlusion and stenosis of bilateral carotid arteries: Secondary | ICD-10-CM

## 2023-11-11 DIAGNOSIS — I10 Essential (primary) hypertension: Secondary | ICD-10-CM

## 2023-11-11 DIAGNOSIS — H35033 Hypertensive retinopathy, bilateral: Secondary | ICD-10-CM

## 2023-11-11 NOTE — Progress Notes (Signed)
 Established Patient Office Visit   Subjective  Patient ID: [name], male    DOB: 1951-10-09  Age: 73 y.o. MRN: [id number]  Chief Complaint  Patient presents with   Follow-up    2 month    Patient is a 73 year old male seen for follow-up.  Patient states someone came to the house to do diabetic retinopathy screening however they were unable to complete the study.  Patient has a letter stating that he needs to set up appointment to have his eyes dilated to complete the test.  Patient states BP at home has been good.  States at times it is in the 90s over 60s or 70s so he cannot take the medications.  This happens maybe 2-3 times per week.  Patient states he did not take his medication yet this morning as he was rushing out of the house.  The highest readings at home have been 168/74.  Patient states he is still eating a little of everything but trying to cut down on frozen foods.  Patient feels like the medications make him dizzy.  Taking Norvasc  5 mg daily (cutting 10 mg tab in half), hydralazine  25 mg twice daily, Coreg  25 mg twice daily, and Cardura  8 mg nightly.  States BS is good.  Highest 150, lowest 74.  Drank orange juice for hypoglycemia .  Taking Novolin  70/30 20 units with breakfast and 24 units with dinner.  Patient has appointment next month with nephrology for history of CKD 4.    Patient Active Problem List   Diagnosis Date Noted   Diabetic ulcer of toe of right foot associated with type 2 diabetes mellitus (HCC) 07/03/2023   Chronic kidney disease, stage 3b (HCC) 01/23/2023   TIA (transient ischemic attack) 01/22/2023   Delayed sleep phase syndrome 11/13/2020   Prostate cancer (HCC) 09/16/2020   Severe nonproliferative diabetic retinopathy of right eye with macular edema associated with type 2 diabetes mellitus (HCC) 08/28/2020   Age-related nuclear cataract, bilateral 08/28/2020   Hypertensive retinopathy of both eyes 08/28/2020   Bilateral carotid artery  stenosis 08/28/2020   Murmur 08/28/2020   Diabetic macular edema (HCC) 07/13/2020   Mild nonproliferative diabetic retinopathy of right eye with macular edema associated with type 2 diabetes mellitus (HCC) 07/13/2020   BPH associated with nocturia 02/21/2020   Type 2 diabetes mellitus with diabetic polyneuropathy, without long-term current use of insulin  (HCC) 07/06/2019   Dyslipidemia 07/06/2019   ED (erectile dysfunction) 12/24/2018   Osteoarthritis 11/25/2018   Uncontrolled type 2 diabetes mellitus with hyperglycemia (HCC) 11/12/2018   Hyperlipidemia associated with type 2 diabetes mellitus (HCC) 11/12/2018   Malignant hypertension 11/10/2018   Insomnia 11/10/2018   Diabetes (HCC) 01/23/2016   Depression 01/22/2016   Past Medical History:  Diagnosis Date   CKD (chronic kidney disease)    Diabetes mellitus without complication (HCC)    Hyperlipidemia    Hypertension    Insomnia    Prostate cancer (HCC)    Past Surgical History:  Procedure Laterality Date   PROSTATE BIOPSY     Social History   Tobacco Use   Smoking status: Never   Smokeless tobacco: Never  Vaping Use   Vaping status: Never Used  Substance Use Topics   Alcohol use: No   Drug use: No   Family History  Problem Relation Age of Onset   Hypertension Mother    Colon cancer Mother    Diabetes Brother    Hypertension Daughter    Breast cancer Neg  Hx    Pancreatic cancer Neg Hx    Prostate cancer Neg Hx    No Known Allergies    ROS Negative unless stated above    Objective:     BP (!) 141/75 (BP Location: Right Arm, Patient Position: Sitting, Cuff Size: Normal)   Pulse 86   Temp 98 F (36.7 C)   Resp 18   Wt 159 lb (72.1 kg)   SpO2 98%   BMI 25.66 kg/m   BP left arm 148/86, right arm 141/75 by this provider. BP Readings from Last 3 Encounters:  11/11/23 (!) 141/75  10/02/23 (!) 172/94  09/02/23 (!) 164/90   Wt Readings from Last 3 Encounters:  11/11/23 159 lb (72.1 kg)  09/02/23 162  lb 6.4 oz (73.7 kg)  08/31/23 160 lb 9.6 oz (72.8 kg)      Physical Exam Constitutional:      General: He is not in acute distress.    Appearance: Normal appearance.  HENT:     Head: Normocephalic and atraumatic.     Nose: Nose normal.     Mouth/Throat:     Mouth: Mucous membranes are moist.  Cardiovascular:     Rate and Rhythm: Normal rate and regular rhythm.     Heart sounds: Normal heart sounds. No murmur heard.    No gallop.  Pulmonary:     Effort: Pulmonary effort is normal. No respiratory distress.     Breath sounds: Normal breath sounds. No wheezing, rhonchi or rales.  Skin:    General: Skin is warm and dry.  Neurological:     Mental Status: He is alert and oriented to person, place, and time.     No results found for any visits on 11/11/23.    Assessment & Plan:  Malignant hypertension -     Ambulatory referral to Ophthalmology  Type 2 diabetes mellitus with diabetic polyneuropathy, with long-term current use of insulin  (HCC) -     Ambulatory referral to Ophthalmology  CKD (chronic kidney disease) stage 4, GFR 15-29 ml/min (HCC)  Severe nonproliferative diabetic retinopathy of right eye with macular edema associated with type 2 diabetes mellitus (HCC) -     Ambulatory referral to Ophthalmology  Diabetic macular edema (HCC) -     Ambulatory referral to Ophthalmology  Hypertensive retinopathy of both eyes -     Ambulatory referral to Ophthalmology  Bilateral carotid artery stenosis -     US  Carotid Bilateral; Future  BP checked by this provider 148/86 left arm, 141/75 right arm.  Concern for worsening bilateral carotid artery stenosis given history of > 20 pt difference in BP readings in b/l arms at recent visit with clinical pharmacist.  Given this and reported dizziness discussed repeat carotid u/s.  Last carotid ultrasound in 2021 with moderate bilateral carotid artery stenosis.  CTA recommended however wish to limit contrast exposure due to patient's  renal function.  Continue current BP meds including Coreg  25 mg twice daily, hydralazine  25 mg twice daily, Norvasc  5 mg daily, Cardura  8 mg nightly.  Stressed the importance of lifestyle modifications.  Continue monitoring BP at home and keeping a log to bring with you to clinic.  Given strict precautions.  Patient encouraged to keep upcoming nephrology appointment to follow-up on CKD 4.  Avoid nephrotoxic medications and renally dose medications.  Referral placed to ophthalmology to follow-up on history of hypertensive retinopathy, macular edema, and severe nonproliferative diabetic retinopathy of right eye.  Hemoglobin A1c 7.4% on 08/31/2023.  Continue  Novolin  70/30 20 units with breakfast and 24 units with dinner.  Continue lifestyle modifications.  Referral to ophthalmology for diabetic retinopathy screening placed.  Continue follow-up with podiatry for foot care.  Return in about 3 months (around 02/09/2024).   Viola Greulich, MD

## 2023-11-11 NOTE — Patient Instructions (Signed)
 A referral to Ophthalmology was placed for your diabetic retinopathy screening/follow-up.  You should expect a phone call about setting up this appointment.  Given your renal function we are unable to do the CT scan with contrast.  An order was also placed for an ultrasound of your carotid arteries to see if the narrowing that was seen in 2021 has become any worse.  Continue current medications.  Bring your blood pressure cuff and log of readings to your next appointment.

## 2023-11-12 ENCOUNTER — Telehealth: Payer: Self-pay | Admitting: Family Medicine

## 2023-11-12 NOTE — Telephone Encounter (Signed)
Copied from CRM [id number]. Topic: Referral - Status >> Nov 12, 2023  8:33 AM Gaetano Hawthorne wrote: Reason for CRM: Patient was referred to Ophthalmologist and the referral states a diagnosis of diabetic retinopathy. Office is asking for notes from an optometrist that diagnosed the patient with the retinopathy - if the patient has not seen an optometrist, they're asking for the patient to be seen by one prior to getting the patient scheduled with them. For any questions, call  [phone]  Fax number is [phone]

## 2023-11-16 NOTE — Telephone Encounter (Signed)
 Office notes have been sent

## 2023-11-18 ENCOUNTER — Other Ambulatory Visit: Payer: Medicare HMO

## 2023-11-19 ENCOUNTER — Inpatient Hospital Stay: Admission: RE | Admit: 2023-11-19 | Payer: Medicare HMO | Source: Ambulatory Visit

## 2023-12-01 ENCOUNTER — Ambulatory Visit: Payer: Medicare HMO | Admitting: Endocrinology

## 2023-12-02 ENCOUNTER — Ambulatory Visit: Payer: Medicare HMO

## 2023-12-02 ENCOUNTER — Telehealth: Payer: Self-pay

## 2023-12-02 NOTE — Telephone Encounter (Signed)
 Returned patient's call. Rescheduled his missed appt from this morning to 12/07/23 at 9am

## 2023-12-02 NOTE — Telephone Encounter (Signed)
 Copied from CRM [id number]. Topic: General - Other >> Dec 02, 2023 12:02 PM Trula Gable C wrote: Reason for CRM: Patient called in stated he had a missed call, is requesting a callback

## 2023-12-02 NOTE — Progress Notes (Deleted)
 12/02/2023 Name: Leland Raver MRN: [id number] DOB: 1951-08-11  No chief complaint on file.   Gurdeep Keesey is a 73 y.o. year old male who was referred for medication management by their primary care provider, [name], MD. They presented for a face to face visit today.   They were referred to the pharmacist by their PCP for assistance in managing diabetes, hypertension, and complex medication management    Subjective:  Care Team: Primary Care Provider: [name], MD ; 11/01/22  Medication Access/Adherence  [facility] Pharmacy:  [phone] [place], [place] - [address] [place] [place] [address] Phone: [phone] Fax: [phone]  [facility], [place] (New Address) - [place], [place] - [address] [facility] AT Previously: [address]iana [address], [place] [address] [facility] [address] 4th Floor [address] [place] [address] Phone: [phone] Fax: [phone]  [facility] - [place]  - [place] - [address] [place] [address] Phone: [phone] Fax: [phone]   Patient reports affordability concerns with their medications: No  Patient reports access/transportation concerns to their pharmacy: No  Patient reports adherence concerns with their medications:  No   Diabetes:  Current medications: Novolin  Kwikpen 20 units qam and 24 units qpm Medications tried in the past: Farxiga  (pt not sure he actually took the medication)  Current glucose readings: ranging from 95-304 over the last 2 weeks, all after a meal. Patient states sometimes he is not waiting 2 hours post-prandial to check sugars but could not tell me which readings those correlated too   Patient denies hypoglycemic s/sx including dizziness, shakiness, sweating. Patient denies hyperglycemic symptoms including polyuria, polydipsia, polyphagia, nocturia, neuropathy, blurred  vision.   Current medication access support: None  Hypertension:  Current medications: Amlodipine  5mg , Carvedilol  25mg  BID, Doxazosin  8mg , Hydralazine  50mg  BID Medications previously tried: Diltiazem , Metoprolol , Losartan , Lisinopril , Triamterene /HCTZ  Patient has a validated, automated, upper arm home BP cuff Current blood pressure readings: 145/82 (left arm today), 172/94 (right arm today) Log of readings shows an avg home reading of 128/72 over last 2 weeks. Most readings below 140/90, occasional elevations. Patient does report checking in both arms and noticing a difference in readings of >20  for SBP (checking within min of each other). Of note, this also occurred in office today. Notified PCP  Patient reports hypotensive s/sx including dizziness, lightheadedness.  Patient denies hypertensive symptoms including headache, chest pain, shortness of breath  Hyperlipidemia/ASCVD Risk Reduction  Current lipid lowering medications: Atorvastatin  80mg  Medications tried in the past:   Antiplatelet regimen: Aspirin  81mg     Objective:  Lab Results  Component Value Date   HGBA1C 7.4 (A) 08/31/2023    Lab Results  Component Value Date   CREATININE 2.49 (H) 08/31/2023   BUN 33 (H) 08/31/2023   NA 138 08/31/2023   K 5.0 08/31/2023   CL 102 08/31/2023   CO2 30 08/31/2023    Lab Results  Component Value Date   CHOL 214 (H) 01/23/2023   HDL 52 01/23/2023   LDLCALC 143 (H) 01/23/2023   LDLDIRECT 98 09/04/2022   TRIG 97 01/23/2023   CHOLHDL 4.1 01/23/2023    Medications Reviewed Today   Medications were not reviewed in this encounter       Assessment/Plan:   Diabetes: - Currently uncontrolled - Reviewed long term cardiovascular and renal outcomes of uncontrolled blood sugar - Reviewed goal A1c, goal fasting, and goal 2 hour post prandial glucose -  Reviewed dietary modifications including low carb diet  - Recommend to check glucose BID, fasting and post-prandial,  provided blood glucose log   Hypertension: - Currently  - - Reviewed long term cardiovascular and renal outcomes of uncontrolled blood pressure - Reviewed appropriate blood pressure monitoring technique and reviewed goal blood pressure. Recommended to check home blood pressure and heart rate daily -Continue current medication therapy  Hyperlipidemia/ASCVD Risk Reduction: - Currently {CHL Controlled/Uncontrolled:[phone]}.  - Reviewed long term complications of uncontrolled cholesterol - Reviewed dietary recommendations including *** - Reviewed lifestyle recommendations including *** - Recommend to ***  - Meets financial criteria for *** patient assistance program through ***. Will collaborate with provider, CPhT, and patient to pursue assistance.       Follow Up Plan:   Jon VEAR Lindau, PharmD Clinical Pharmacist [phone]

## 2023-12-03 NOTE — Progress Notes (Deleted)
 12/03/2023 Name: [name] MRN: [id number] DOB: 11-12-1950  No chief complaint on file.   [name] is a 73 y.o. year old male who was referred for medication management by their primary care provider, Deeann Saint, MD. They presented for a face to face visit today.   They were referred to the pharmacist by their PCP for assistance in managing diabetes, hypertension, and complex medication management    Subjective:  Care Team: Primary Care Provider: Deeann Saint, MD ; 11/01/22  Medication Access/Adherence  [facility]  [phone] [place], [place] - [address] [place] [address] Phone: [phone] Fax: [phone]  [facility], [place] (New Address) - [place], [place] - [address] [facility] AT Previously: [place], [place] [address] [facility] [address] 4[address] [place] [address] Phone: [phone] Fax: [phone]  [facility] - [place], [place] - [address] [place] [address] Phone: [phone] Fax: [phone]   Patient reports affordability concerns with their medications: No  Patient reports access/transportation concerns to their pharmacy: No  Patient reports adherence concerns with their medications:  No   Diabetes:  Current medications: Novolin Kwikpen 20 units qam and 24 units qpm Medications tried in the past: [place] (pt not sure he actually took the medication)  Current glucose readings: ranging from 95-304 over the last 2 weeks, all after a meal. Patient states sometimes he is not waiting 2 hours post-prandial to check sugars but could not tell me which readings those correlated too   Patient denies hypoglycemic s/sx including dizziness, shakiness, sweating. Patient denies hyperglycemic symptoms including polyuria, polydipsia, polyphagia, nocturia, neuropathy, blurred  vision.   Current medication access support: None  Hypertension:  Current medications: Amlodipine 5mg , Carvedilol 25mg  BID, Doxazosin 8mg , Hydralazine 50mg  BID Medications previously tried: Diltiazem, Metoprolol, Losartan, Lisinopril, Triamterene/HCTZ  Patient has a validated, automated, upper arm home BP cuff Current blood pressure readings: 145/82 (left arm today), 172/94 (right arm today) Log of readings shows an avg home reading of 128/72 over last 2 weeks. Most readings below 140/90, occasional elevations. Patient does report checking in both arms and noticing a difference in readings of >20  for SBP (checking within min of each other). Of note, this also occurred in office today. Notified PCP  Patient reports hypotensive s/sx including dizziness, lightheadedness.  Patient denies hypertensive symptoms including headache, chest pain, shortness of breath  Hyperlipidemia/ASCVD Risk Reduction  Current lipid lowering medications: Atorvastatin 80mg  Medications tried in the past:   Antiplatelet regimen: Aspirin 81mg     Objective:  Lab Results  Component Value Date   HGBA1C 7.4 (A) 08/31/2023    Lab Results  Component Value Date   CREATININE 2.49 (H) 08/31/2023   BUN 33 (H) 08/31/2023   NA 138 08/31/2023   K 5.0 08/31/2023   CL 102 08/31/2023   CO2 30 08/31/2023    Lab Results  Component Value Date   CHOL 214 (H) 01/23/2023   HDL 52 01/23/2023   LDLCALC 143 (H) 01/23/2023   LDLDIRECT 98 09/04/2022   TRIG 97 01/23/2023   CHOLHDL 4.1 01/23/2023    Medications Reviewed Today   Medications were not reviewed in this encounter       Assessment/Plan:   Diabetes: - Currently uncontrolled - Reviewed long term cardiovascular and renal outcomes of uncontrolled blood sugar - Reviewed goal A1c, goal fasting, and goal 2 hour post prandial glucose -  Reviewed dietary modifications including low carb diet  - Recommend to check glucose BID, fasting and post-prandial,  provided blood glucose log   Hypertension: - Currently  - - Reviewed long term cardiovascular and renal outcomes of uncontrolled blood pressure - Reviewed appropriate blood pressure monitoring technique and reviewed goal blood pressure. Recommended to check home blood pressure and heart rate daily -Continue current medication therapy  Hyperlipidemia/ASCVD Risk Reduction: - Currently {CHL Controlled/Uncontrolled:[phone]}.  - Reviewed long term complications of uncontrolled cholesterol - Reviewed dietary recommendations including *** - Reviewed lifestyle recommendations including *** - Recommend to ***  - Meets financial criteria for *** patient assistance program through ***. Will collaborate with provider, CPhT, and patient to pursue assistance.       Follow Up Plan:   Sherrill Raring, PharmD Clinical Pharmacist [phone]

## 2023-12-07 ENCOUNTER — Ambulatory Visit: Payer: Medicare HMO

## 2023-12-14 ENCOUNTER — Ambulatory Visit: Payer: Medicare HMO

## 2023-12-14 DIAGNOSIS — E1142 Type 2 diabetes mellitus with diabetic polyneuropathy: Secondary | ICD-10-CM

## 2023-12-14 DIAGNOSIS — Z794 Long term (current) use of insulin: Secondary | ICD-10-CM | POA: Diagnosis not present

## 2023-12-14 DIAGNOSIS — I10 Essential (primary) hypertension: Secondary | ICD-10-CM

## 2023-12-14 NOTE — Progress Notes (Signed)
12/14/2023 Name: [name] MRN: [id number] DOB: 11-Jan-1951  Chief Complaint  Patient presents with   Hypertension   Medication Management   Diabetes    [name] is a 73 y.o. year old male who was referred for medication management by their primary care provider, Deeann Saint, MD. They presented for a face to face visit today.   They were referred to the pharmacist by their PCP for assistance in managing diabetes, hypertension, and complex medication management    Subjective:  Care Team: Primary Care Provider: Deeann Saint, MD ; 02/10/2024  Medication Access/Adherence  [phone] [place], [place] - [address] [place] [address] Phone: [phone] Fax: [phone]  [facility] [place] (New Address) - [place], [place] - [address] [facility] AT Previously: [address], [place] [address] [name] Building 2 4th Floor [address] [place] [address] Phone: [phone] Fax: [phone]  [facility] - [place], [place] - [address] [place] [address] Phone: [phone] Fax: [phone]   Patient reports affordability concerns with their medications: No  Patient reports access/transportation concerns to their pharmacy: No  Patient reports adherence concerns with their medications:  YES - STOPS MEDS ON HIS OWN DUE TO "NOT FEELING GOOD" when taking them all   Diabetes:  Current medications: Novolin Kwikpen 22 units qam and 21 units qpm Medications tried in the past: [place] (pt not sure he actually took the medication)  Current glucose readings: Did not bring a log or his meter to the appt, reports 240 fasting yesterday  Patient denies hypoglycemic s/sx including dizziness, shakiness, sweating. Patient denies hyperglycemic symptoms including polyuria, polydipsia, polyphagia, nocturia, neuropathy, blurred  vision.   Current medication access support: None  Hypertension:  Current medications: Amlodipine 5mg , Carvedilol 25mg  BID, Doxazosin 8mg , Hydralazine 50mg  BID Medications previously tried: Diltiazem, Metoprolol, Losartan, Lisinopril, Triamterene/HCTZ  Patient has a validated, automated, upper arm home BP cuff Current blood pressure readings: 190/105 (left arm today), 209/113 (right arm today) - HAS NOT TAKEN ANY MEDS TODAY, DID NOT WANT TO Reports checking at home and readings all over the place due to noncompliance with his medications  Patient reports hypotensive s/sx including dizziness, lightheadedness. (Not today, when taking all of his medications) Patient denies hypertensive symptoms including headache, chest pain, shortness of breath   Objective:  Lab Results  Component Value Date   HGBA1C 7.4 (A) 08/31/2023    Lab Results  Component Value Date   CREATININE 2.49 (H) 08/31/2023   BUN 33 (H) 08/31/2023   NA 138 08/31/2023   K 5.0 08/31/2023   CL 102 08/31/2023   CO2 30 08/31/2023    Lab Results  Component Value Date   CHOL 214 (H) 01/23/2023   HDL 52 01/23/2023   LDLCALC 143 (H) 01/23/2023   LDLDIRECT 98 09/04/2022   TRIG 97 01/23/2023   CHOLHDL 4.1 01/23/2023    Medications Reviewed Today     Reviewed by Sherrill Raring, RPH (Pharmacist) on 12/14/23 at 1532  Med List Status: <None>   Medication Order Taking? Sig Documenting Provider Last Dose Status Informant  amLODipine (NORVASC) 10 MG tablet [id number] No TAKE 1 TABLET BY MOUTH EVERY DAY Deeann Saint, MD Taking Active            Med Note Sherrill Raring   Fri Oct 02, 2023  2:22 PM) 1/2 tab by mouth daily  aspirin EC 81 MG tablet [id number] No Take 1 tablet (81 mg total) by mouth daily. Swallow whole. Deeann Saint, MD Taking Active   atorvastatin (LIPITOR) 80 MG tablet [id number] No Take 1 tablet (80 mg total) by mouth daily. Deeann Saint, MD Taking Active   blood glucose meter kit and  supplies [id number] No Dispense based on patient and insurance preference. Use to check glucose daily (FOR ICD-10 E10.9, E11.9). Everrett Coombe, DO Taking Active   Blood Glucose Monitoring Suppl (ONE TOUCH ULTRA 2) w/Device KIT [id number] No Use to check blood sugars twice daily as directed. Dx E11.9 Deeann Saint, MD Taking Active   carvedilol (COREG) 25 MG tablet [id number] No TAKE 1 TABLET BY MOUTH TWICE DAILY Alver Sorrow, NP Taking Active   doxazosin (CARDURA) 8 MG tablet [id number] No Take 1 tablet (8 mg total) by mouth at bedtime. Deeann Saint, MD Taking Active   ferrous gluconate (FERGON) 324 MG tablet [id number] No Take 1 tablet (324 mg total) by mouth daily with breakfast. Deeann Saint, MD Taking Active   glucose blood (ONETOUCH ULTRA TEST) test strip [id number] No Use to check blood sugar twice daily as directed E11.9 Deeann Saint, MD Taking Active   hydrALAZINE (APRESOLINE) 50 MG tablet [id number] No Take 1 tablet (50 mg total) by mouth in the morning and at bedtime. Deeann Saint, MD Taking Active   insulin isophane & regular human KwikPen (NOVOLIN 70/30 KWIKPEN) (70-30) 100 UNIT/ML KwikPen [id number] No Inject 20 Units into the skin daily before breakfast AND 24 Units at bedtime. Deeann Saint, MD Taking Active   Insulin Pen Needle (BD PEN NEEDLE NANO 2ND GEN) 32G X 4 MM MISC [id number] No To use with insulin pen twice a day. Deeann Saint, MD Taking Active   Lancet Devices (ONE TOUCH DELICA LANCING DEV) MISC [id number] No 1 Device by Does not apply route as directed. Shamleffer, Konrad Dolores, MD Taking Active   Lancets [facility] Larose [place] PLUS [place]) MISC [id number] No Use 1 each to test sugars twice daily as directed E11.9 Deeann Saint, MD Taking Active   latanoprost (XALATAN) 0.005 % ophthalmic solution [id number] No SMARTSIG:In Eye(s) [provider] Taking Active               Assessment/Plan:   Diabetes: - Currently uncontrolled -  Reviewed long term cardiovascular and renal outcomes of uncontrolled blood sugar - Reviewed goal A1c, goal fasting, and goal 2 hour post prandial glucose - Reviewed dietary modifications including low carb diet  - Recommend to check glucose BID, fasting and post-prandial, provided blood glucose log   Hypertension: - Currently UNCONTROLLED - Reviewed long term cardiovascular and renal outcomes of uncontrolled blood pressure - Reviewed appropriate blood pressure monitoring technique and reviewed goal blood pressure. Recommended to check home blood pressure and heart rate daily -Continue current medication therapy starting today, check twice daily and bring readings to appt    Follow Up Plan: 1 week  Sherrill Raring, PharmD Clinical Pharmacist [phone]

## 2023-12-21 ENCOUNTER — Ambulatory Visit (INDEPENDENT_AMBULATORY_CARE_PROVIDER_SITE_OTHER): Payer: Medicare HMO

## 2023-12-21 DIAGNOSIS — I10 Essential (primary) hypertension: Secondary | ICD-10-CM

## 2023-12-21 DIAGNOSIS — E1142 Type 2 diabetes mellitus with diabetic polyneuropathy: Secondary | ICD-10-CM

## 2023-12-21 DIAGNOSIS — Z794 Long term (current) use of insulin: Secondary | ICD-10-CM

## 2023-12-21 NOTE — Patient Instructions (Addendum)
 Check blood sugars twice daily.  Stop taking your hydralazine (peach color tablet).  Bring ALL medications and your meter with you to next appointment. March 10th at 9am  Check blood pressure twice daily in both arms

## 2023-12-21 NOTE — Progress Notes (Signed)
 12/21/2023 Name: [name] MRN: [id number] DOB: [birth year]  Chief Complaint  Patient presents with   Hypertension   Diabetes   Medication Management    [name] is a 73 y.o. year old male who was referred for medication management by their primary care provider, Deeann Saint, MD. They presented for a face to face visit today.   They were referred to the pharmacist by their PCP for assistance in managing diabetes, hypertension, and complex medication management    Subjective:  Care Team: Primary Care Provider: Deeann Saint, MD ; 02/10/2024  Medication Access/Adherence  [facility]  [phone] [place], [place] - [address] [place] [address] Phone: [phone] Fax: [phone]  [facility] [place] (New Address) - [place], [place] - [address] [facility] AT Previously: [address], [place] [address] [facility] Building 2 4th Floor [address] [place] [address] Phone: [phone] Fax: [phone]  [facility] [place], [place] - [address] [place] [address] Phone: [phone] Fax: [phone]   Patient reports affordability concerns with their medications: No  Patient reports access/transportation concerns to their pharmacy: No  Patient reports adherence concerns with their medications:  YES - STOPS MEDS ON HIS OWN DUE TO "NOT FEELING GOOD" when taking them all   Diabetes:  Current medications: Novolin Kwikpen 22 units qam and 21 units qpm Medications tried in the past: [place] (pt not sure he actually took the medication)  Current glucose readings: Only one reading available, a 247 reading from yesterday evening. Meter was setup in Spanish, which patient cannot read  Patient denies hypoglycemic s/sx including dizziness, shakiness, sweating. Patient denies hyperglycemic symptoms including polyuria, polydipsia,  polyphagia, nocturia, neuropathy, blurred vision.   Current medication access support: None  Hypertension:  Current medications: Amlodipine 5mg , Carvedilol 25mg  BID, Doxazosin 8mg , Hydralazine 50mg  BID Medications previously tried: Diltiazem, Metoprolol, Losartan, Lisinopril, Triamterene/HCTZ  Patient has a validated, automated, upper arm home BP cuff Current blood pressure readings: 163/93 (left arm today), 186/98 (right arm today) - HAS NOT TAKEN ANY MEDS TODAY, DID NOT WANT TO Reports checking at home and readings all over the place due to noncompliance with his medications. On days where he states he took all BP meds as prescribed, BP quite low 80s/50s  Patient reports hypotensive s/sx including dizziness, lightheadedness. (Not today, when taking all of his medications) Patient denies hypertensive symptoms including headache, chest pain, shortness of breath   Objective:  Lab Results  Component Value Date   HGBA1C 7.4 (A) 08/31/2023    Lab Results  Component Value Date   CREATININE 2.49 (H) 08/31/2023   BUN 33 (H) 08/31/2023   NA 138 08/31/2023   K 5.0 08/31/2023   CL 102 08/31/2023   CO2 30 08/31/2023    Lab Results  Component Value Date   CHOL 214 (H) 01/23/2023   HDL 52 01/23/2023   LDLCALC 143 (H) 01/23/2023   LDLDIRECT 98 09/04/2022   TRIG 97 01/23/2023   CHOLHDL 4.1 01/23/2023    Medications Reviewed Today     Reviewed by Sherrill Raring, RPH (Pharmacist) on 12/21/23 at 1045  Med List Status: <None>   Medication Order Taking? Sig Documenting Provider Last Dose Status Informant  amLODipine (NORVASC) 10 MG tablet [id number] Yes TAKE 1 TABLET BY MOUTH EVERY DAY Deeann Saint, MD Taking Active  Med Note Leitha Bleak, Koda Routon H   Fri Oct 02, 2023  2:22 PM) 1/2 tab by mouth daily  aspirin EC 81 MG tablet [id number] Yes Take 1 tablet (81 mg total) by mouth daily. Swallow whole. Deeann Saint, MD Taking Active   atorvastatin (LIPITOR) 80 MG tablet  [id number] Yes Take 1 tablet (80 mg total) by mouth daily. Deeann Saint, MD Taking Active   blood glucose meter kit and supplies [id number]  Dispense based on patient and insurance preference. Use to check glucose daily (FOR ICD-10 E10.9, E11.9). Everrett Coombe, DO  Active   Blood Glucose Monitoring Suppl (ONE TOUCH ULTRA 2) w/Device KIT [id number]  Use to check blood sugars twice daily as directed. Dx E11.9 Deeann Saint, MD  Active   carvedilol (COREG) 25 MG tablet [id number] Yes TAKE 1 TABLET BY MOUTH TWICE DAILY Alver Sorrow, NP Taking Active   doxazosin (CARDURA) 8 MG tablet [id number] Yes Take 1 tablet (8 mg total) by mouth at bedtime. Deeann Saint, MD Taking Active   ferrous gluconate (FERGON) 324 MG tablet [id number] Yes Take 1 tablet (324 mg total) by mouth daily with breakfast. Deeann Saint, MD Taking Active   glucose blood (ONETOUCH ULTRA TEST) test strip [id number]  Use to check blood sugar twice daily as directed E11.9 Deeann Saint, MD  Active   hydrALAZINE (APRESOLINE) 50 MG tablet [id number] Yes Take 1 tablet (50 mg total) by mouth in the morning and at bedtime. Deeann Saint, MD Taking Active   insulin isophane & regular human KwikPen (NOVOLIN 70/30 KWIKPEN) (70-30) 100 UNIT/ML KwikPen [id number] Yes Inject 20 Units into the skin daily before breakfast AND 24 Units at bedtime. Deeann Saint, MD Taking Active   Insulin Pen Needle (BD PEN NEEDLE NANO 2ND GEN) 32G X 4 MM MISC [id number]  To use with insulin pen twice a day. Deeann Saint, MD  Active   Lancet Devices (ONE TOUCH DELICA LANCING DEV) MISC [id number]  1 Device by Does not apply route as directed. Shamleffer, Konrad Dolores, MD  Active   Lancets [facility] Larose [place]ells PLUS [place]) MISC [id number]  Use 1 each to test sugars twice daily as directed E11.9 Deeann Saint, MD  Active   latanoprost (XALATAN) 0.005 % ophthalmic solution [id number]  SMARTSIG:In Eye(s) [provider]  Active                Assessment/Plan:   Diabetes: - Currently uncontrolled - Reviewed long term cardiovascular and renal outcomes of uncontrolled blood sugar - Reviewed goal A1c, goal fasting, and goal 2 hour post prandial glucose - Reviewed dietary modifications including low carb diet  - Recommend to check glucose BID, fasting and post-prandial, provided blood glucose log -Reschedule missed appt with Dr. Erroll Luna  Hypertension: - Currently UNCONTROLLED - Reviewed long term cardiovascular and renal outcomes of uncontrolled blood pressure - Reviewed appropriate blood pressure monitoring technique and reviewed goal blood pressure. Recommended to check home blood pressure and heart rate daily -check twice daily and bring readings to appt -Discussed with PCP last week, okay to trail stopping hydralazine starting today -Reschedule missed carotid ultrasound    Follow Up Plan: 2 weeks Sherrill Raring, PharmD Clinical Pharmacist [phone]

## 2024-01-04 ENCOUNTER — Ambulatory Visit: Payer: Medicare HMO

## 2024-01-05 ENCOUNTER — Other Ambulatory Visit: Payer: Self-pay | Admitting: Family Medicine

## 2024-01-05 DIAGNOSIS — I10 Essential (primary) hypertension: Secondary | ICD-10-CM

## 2024-01-08 DIAGNOSIS — R351 Nocturia: Secondary | ICD-10-CM | POA: Diagnosis not present

## 2024-01-08 DIAGNOSIS — E1122 Type 2 diabetes mellitus with diabetic chronic kidney disease: Secondary | ICD-10-CM | POA: Diagnosis not present

## 2024-01-08 DIAGNOSIS — D631 Anemia in chronic kidney disease: Secondary | ICD-10-CM | POA: Diagnosis not present

## 2024-01-08 DIAGNOSIS — N184 Chronic kidney disease, stage 4 (severe): Secondary | ICD-10-CM | POA: Diagnosis not present

## 2024-01-08 DIAGNOSIS — N401 Enlarged prostate with lower urinary tract symptoms: Secondary | ICD-10-CM | POA: Diagnosis not present

## 2024-01-08 DIAGNOSIS — E1129 Type 2 diabetes mellitus with other diabetic kidney complication: Secondary | ICD-10-CM | POA: Diagnosis not present

## 2024-01-08 DIAGNOSIS — N189 Chronic kidney disease, unspecified: Secondary | ICD-10-CM | POA: Diagnosis not present

## 2024-01-08 DIAGNOSIS — E875 Hyperkalemia: Secondary | ICD-10-CM | POA: Diagnosis not present

## 2024-01-08 DIAGNOSIS — N2581 Secondary hyperparathyroidism of renal origin: Secondary | ICD-10-CM | POA: Diagnosis not present

## 2024-01-08 DIAGNOSIS — I129 Hypertensive chronic kidney disease with stage 1 through stage 4 chronic kidney disease, or unspecified chronic kidney disease: Secondary | ICD-10-CM | POA: Diagnosis not present

## 2024-01-10 NOTE — Progress Notes (Unsigned)
 01/11/2024 Name: [name] MRN: [id number] DOB: 1951-03-22  No chief complaint on file.   [name] is a 73 y.o. year old male who was referred for medication management by their primary care provider, Deeann Saint, MD. They presented for a face to face visit today.   They were referred to the pharmacist by their PCP for assistance in managing diabetes, hypertension, and complex medication management    Subjective:  Care Team: Primary Care Provider: Deeann Saint, MD ; 02/10/2024  Medication Access/Adherence  [facility]  [phone] [place], [place] - [address] [place] [address] Phone: [phone] Fax: [phone]  [facility] [place] (New Address) - [place], [place] - [address] [facility] AT Previously: [address], [place] [address] [facility] Building 2 4th Floor [address] [place] [address] Phone: [phone] Fax: [phone]  [facility] - [place], [place] - [address] [place] [address] Phone: [phone] Fax: [phone]   Patient reports affordability concerns with their medications: No  Patient reports access/transportation concerns to their pharmacy: No  Patient reports adherence concerns with their medications:  YES - STOPS MEDS ON HIS OWN DUE TO "NOT FEELING GOOD" when taking them all   Diabetes:  A1C =7.4 08/2023; goal <8; check sugars BID  Review medications and adherence (timing of meds, etc.)   At home BGs? Have a BP log or meter with you? Highs - inc to 24 Lows - dec to 18 Am BG Hyperglycemia sx (nocturia, neuropathy, visual changes, foot exams) Hypoglycemia symptoms (dizziness, shaky, sweating, hungry, confusion)  Ask if had a chance to reschedule appt with Dr. Erroll Luna for endo?    Current medications: Novolin Kwikpen 22 units qam and 21 units qpm Medications tried in the past:  [place] (pt not sure he actually took the medication)  Current glucose readings: Only one reading available, a 247 reading from yesterday evening. Meter was setup in Spanish, which patient cannot read  Patient denies hypoglycemic s/sx including dizziness, shakiness, sweating. Patient denies hyperglycemic symptoms including polyuria, polydipsia, polyphagia, nocturia, neuropathy, blurred vision.   Current medication access support: None  Hypertension: amlo and coreg not active but was filled 3/11; hx noncompliance ; check BP BID  Compliance? Took meds this morning? Talked about stopping hydralazing for low BP when taking all meds - still taking?  Check Clinic BP? Home BP logs? If no logs, bring to next visit w/ BP cuff -inc amlo to 10 or dox10 ?max16; coreg max   Additional BP therapy if needed  Dizziness - still experiencing when taking all ur meds?  Headaches? History of swelling?  Have carotid US been re-scheduled?   Current medications: Amlodipine 5mg , Carvedilol 25mg  BID, Doxazosin 8mg , Hydralazine 50mg  BID Medications previously tried: Diltiazem, Metoprolol, Losartan, Lisinopril, Triamterene/HCTZ  Patient has a validated, automated, upper arm home BP cuff Current blood pressure readings: 163/93 (left arm today), 186/98 (right arm today) - HAS NOT TAKEN ANY MEDS TODAY, DID NOT WANT TO Reports checking at home and readings all over the place due to noncompliance with his medications. On days where he states he took all BP meds as prescribed, BP quite low 80s/50s  Patient reports hypotensive s/sx including dizziness, lightheadedness. (Not today, when taking all of his medications) Patient denies hypertensive symptoms including headache, chest pain, shortness of breath   Objective:  Lab Results  Component Value Date   HGBA1C 7.4 (A) 08/31/2023  Lab Results  Component Value Date   CREATININE 2.49 (H) 08/31/2023   BUN 33 (H) 08/31/2023   NA 138 08/31/2023   K 5.0  08/31/2023   CL 102 08/31/2023   CO2 30 08/31/2023    Lab Results  Component Value Date   CHOL 214 (H) 01/23/2023   HDL 52 01/23/2023   LDLCALC 143 (H) 01/23/2023   LDLDIRECT 98 09/04/2022   TRIG 97 01/23/2023   CHOLHDL 4.1 01/23/2023    Medications Reviewed Today   Medications were not reviewed in this encounter       Assessment/Plan:   Diabetes: - Currently uncontrolled - Reviewed long term cardiovascular and renal outcomes of uncontrolled blood sugar - Reviewed goal A1c, goal fasting, and goal 2 hour post prandial glucose - Reviewed dietary modifications including low carb diet  - Recommend to check glucose BID, fasting and post-prandial, provided blood glucose log -Reschedule missed appt with Dr. Erroll Luna - not done yet   Hypertension: - Currently UNCONTROLLED - Reviewed long term cardiovascular and renal outcomes of uncontrolled blood pressure - Reviewed appropriate blood pressure monitoring technique and reviewed goal blood pressure. Recommended to check home blood pressure and heart rate daily -check twice daily and bring readings to appt -Discussed with PCP last week, okay to trail stopping hydralazine starting today -Reschedule missed carotid ultrasound    Follow Up Plan: 2 weeks Sherrill Raring, PharmD Clinical Pharmacist [phone]  Verdene Rio, PharmD PGY1 Pharmacy Resident

## 2024-01-11 ENCOUNTER — Ambulatory Visit (INDEPENDENT_AMBULATORY_CARE_PROVIDER_SITE_OTHER)

## 2024-01-11 DIAGNOSIS — I10 Essential (primary) hypertension: Secondary | ICD-10-CM

## 2024-01-11 DIAGNOSIS — Z794 Long term (current) use of insulin: Secondary | ICD-10-CM

## 2024-01-11 DIAGNOSIS — E1142 Type 2 diabetes mellitus with diabetic polyneuropathy: Secondary | ICD-10-CM

## 2024-01-11 NOTE — Patient Instructions (Addendum)
 Continue to take all medications as prescribed except the following: Do not take Hydralazine (peach pill) Take only a half tablet of Amlodipine (the round white pill that says 10)  Check blood pressure at 2 to 3 times daily and keep a log. Check blood sugar twice daily (finger pricks)  KEEP BLOOD PRESSURE MONITOR AND BLOOD SUGAR MONITOR ON YOU AT ALL TIMES  Reschedule your missed appointment for ultrasound and endocrinologist Dr. Erroll Luna as soon as possible!!  Follow Up with me: 01/18/24 in office at [facility] 9:30am. Bring Blood sugar meter and machine with you.  Sherrill Raring, PharmD Clinical Pharmacist [phone]

## 2024-01-18 ENCOUNTER — Ambulatory Visit

## 2024-01-25 ENCOUNTER — Other Ambulatory Visit (INDEPENDENT_AMBULATORY_CARE_PROVIDER_SITE_OTHER)

## 2024-01-25 DIAGNOSIS — I10 Essential (primary) hypertension: Secondary | ICD-10-CM

## 2024-01-25 DIAGNOSIS — E1165 Type 2 diabetes mellitus with hyperglycemia: Secondary | ICD-10-CM

## 2024-01-25 DIAGNOSIS — Z794 Long term (current) use of insulin: Secondary | ICD-10-CM

## 2024-01-25 DIAGNOSIS — E1142 Type 2 diabetes mellitus with diabetic polyneuropathy: Secondary | ICD-10-CM

## 2024-01-25 DIAGNOSIS — E1169 Type 2 diabetes mellitus with other specified complication: Secondary | ICD-10-CM

## 2024-01-25 DIAGNOSIS — E785 Hyperlipidemia, unspecified: Secondary | ICD-10-CM

## 2024-01-25 MED ORDER — CARVEDILOL 25 MG PO TABS: 25.0000 mg | ORAL_TABLET | Freq: Two times a day (BID) | ORAL | 0 refills | Status: DC

## 2024-01-25 MED ORDER — ASPIRIN 81 MG PO TBEC: 81.0000 mg | DELAYED_RELEASE_TABLET | Freq: Every day | ORAL | 0 refills | Status: DC

## 2024-01-25 MED ORDER — NOVOLIN 70/30 FLEXPEN (70-30) 100 UNIT/ML ~~LOC~~ SUPN: PEN_INJECTOR | SUBCUTANEOUS | 0 refills | Status: DC

## 2024-01-25 MED ORDER — ATORVASTATIN CALCIUM 80 MG PO TABS
80.0000 mg | ORAL_TABLET | Freq: Every day | ORAL | 0 refills | Status: DC
Start: 2024-01-25 — End: 2024-02-29

## 2024-01-25 NOTE — Progress Notes (Signed)
 01/11/2024 Name: [name] MRN: [id number] DOB: 1951-08-15  Chief Complaint  Patient presents with   Hypertension   Medication Management    Jorell Agne is a 73 y.o. year old male who was referred for medication management by their primary care provider, Deeann Saint, MD. They presented for a face to face visit today.   They were referred to the pharmacist by their PCP for assistance in managing diabetes, hypertension, and complex medication management    Subjective:  Care Team: Primary Care Provider: Deeann Saint, MD ; 02/10/2024  Medication Access/Adherence  [phone] [place], [place] - [address] [place] [address] Phone: [phone] Fax: [phone]  [facility] [place] (New Address) - [place], [place] - [address] [facility] AT Previously: [place], [place] [address] [facility] [address] 2 4th Floor [address] [place] [address] Phone: [phone] Fax: [phone]  [facility] - [place], [place] - [address] [place] [address] Phone: [phone] Fax: [phone]   Patient reports affordability concerns with their medications: No  Patient reports access/transportation concerns to their pharmacy: No  Patient reports adherence concerns with their medications:  YES - STOPS MEDS ON HIS OWN DUE TO "NOT FEELING GOOD" when taking them all  Hypertension:  Current medications: Amlodipine 5mg , Carvedilol 25mg  BID, Doxazosin 8mg  Medications previously tried: Diltiazem, Metoprolol, Losartan, Lisinopril, Triamterene/HCTZ  Patient has a validated, automated, upper arm home BP cuff Current blood pressure readings: 206/106 HR 72 in both arms today  Reports checking at home and readings all over the place due to noncompliance with his medications.  On days where he states he took all BP meds as prescribed,  complains of dizziness and lower BP.  Did not bring log of readings to appt today.  Patient reports hypotensive s/sx including dizziness, lightheadedness. (Not today, when taking all of his medications).  Patient denies hypertensive symptoms including headache, chest pain, shortness of breath   Objective:  Lab Results  Component Value Date   HGBA1C 7.4 (A) 08/31/2023    Lab Results  Component Value Date   CREATININE 2.49 (H) 08/31/2023   BUN 33 (H) 08/31/2023   NA 138 08/31/2023   K 5.0 08/31/2023   CL 102 08/31/2023   CO2 30 08/31/2023    Lab Results  Component Value Date   CHOL 214 (H) 01/23/2023   HDL 52 01/23/2023   LDLCALC 143 (H) 01/23/2023   LDLDIRECT 98 09/04/2022   TRIG 97 01/23/2023   CHOLHDL 4.1 01/23/2023    Medications Reviewed Today     Reviewed by Sherrill Raring, RPH (Pharmacist) on 01/25/24 at 1501  Med List Status: <None>   Medication Order Taking? Sig Documenting Provider Last Dose Status Informant    Discontinued 01/25/24 1500 (Discontinued by provider)            Med Note Leitha Bleak, Jakki Doughty H   Mon Jan 11, 2024 11:05 AM) Taking 1/2 tab by mouth once daily  aspirin EC 81 MG tablet [id number] No Take 1 tablet (81 mg total) by mouth daily. Swallow whole. Deeann Saint, MD Taking Active   atorvastatin (LIPITOR) 80 MG tablet [id number] No Take 1 tablet (80 mg total) by mouth daily. Deeann Saint, MD Taking Active   blood glucose meter kit and supplies [id number] No Dispense based on patient and insurance preference. Use to check glucose daily (FOR ICD-10 E10.9, E11.9).  Everrett Coombe, DO Taking Active   Blood Glucose Monitoring Suppl (ONE TOUCH ULTRA 2) w/Device KIT [id number] No Use to check blood sugars twice daily as directed. Dx E11.9 Deeann Saint, MD Taking Active   carvedilol (COREG) 25 MG tablet [id number] No TAKE 1 TABLET BY MOUTH TWICE DAILY Alver Sorrow, NP Taking Active   Discontinued 01/25/24 1500 (Discontinued by provider)    Discontinued 01/25/24 1500 (Patient Preference)   glucose blood (ONETOUCH ULTRA TEST) test strip [id number] No Use to check blood sugar twice daily as directed E11.9 Deeann Saint, MD Taking Active   Patient not taking:  Discontinued 01/25/24 1501 (Discontinued by provider)   insulin isophane & regular human KwikPen (NOVOLIN 70/30 KWIKPEN) (70-30) 100 UNIT/ML KwikPen [id number] No Inject 20 Units into the skin daily before breakfast AND 24 Units at bedtime. Deeann Saint, MD Taking Active            Med Note Sherrill Raring   Mon Jan 11, 2024 11:06 AM) 21 units qam and 22 units qpm  Insulin Pen Needle (BD PEN NEEDLE NANO 2ND GEN) 32G X 4 MM MISC [id number] No To use with insulin pen twice a day. Deeann Saint, MD Taking Active   Lancet Devices (ONE TOUCH DELICA LANCING DEV) MISC [id number] No 1 Device by Does not apply route as directed. Shamleffer, Konrad Dolores, MD Taking Active   Lancets [facility] DELICA PLUS [place]) MISC [id number] No Use 1 each to test sugars twice daily as directed E11.9 Deeann Saint, MD Taking Active   latanoprost (XALATAN) 0.005 % ophthalmic solution [id number] No SMARTSIG:In Eye(s) [provider] Taking Active               Assessment/Plan:    Hypertension: - Currently UNCONTROLLED - Reviewed long term cardiovascular and renal outcomes of uncontrolled blood pressure - Reviewed appropriate blood pressure monitoring technique and reviewed goal blood pressure. Recommended to check home blood pressure and heart rate daily -check twice daily and bring readings to appt -Patient continues to be non-compliant with meds and reports dizziness when he takes some of them (picks and chooses which ones to take). Consulted with PCP and will start over on BP meds. STOP all oral medications except aspirin 81mg , carvedilol 25mg  BID, Atorvastatin 80mg . -Reschedule missed carotid ultrasound -Patient requests to stop using pill packaging for compliance,  [facility]   Follow Up Plan: 4 weeks, sees PCP in 2 weeks Sherrill Raring, PharmD Clinical Pharmacist [phone]

## 2024-01-25 NOTE — Patient Instructions (Signed)
 STOP Doxazosin, Hydralazine and Amlodipine.  Continue taking Aspirin, Atorvastatin, Carvedilol twice daily and your insulin.

## 2024-01-26 ENCOUNTER — Telehealth: Payer: Self-pay

## 2024-01-26 NOTE — Progress Notes (Addendum)
   01/26/2024  Patient ID: [name], male   DOB: 1951-02-23, 73 y.o.   MRN: [id number]  Contacted [facility] to cancel patient's prescription on file and enrollment in adherence packaging. [facility] non-compliance to most medications. [facility]ed [facility] (see visit note from 01/25/24).  Pharmacy confirmed the cancellation has been submitted. Patient aware  Sherrill Raring, PharmD Clinical Pharmacist [phone]

## 2024-02-10 ENCOUNTER — Ambulatory Visit: Payer: Medicare HMO | Admitting: Family Medicine

## 2024-02-22 ENCOUNTER — Ambulatory Visit (INDEPENDENT_AMBULATORY_CARE_PROVIDER_SITE_OTHER)

## 2024-02-22 ENCOUNTER — Other Ambulatory Visit

## 2024-02-22 DIAGNOSIS — I10 Essential (primary) hypertension: Secondary | ICD-10-CM

## 2024-02-22 DIAGNOSIS — Z794 Long term (current) use of insulin: Secondary | ICD-10-CM

## 2024-02-22 DIAGNOSIS — E1142 Type 2 diabetes mellitus with diabetic polyneuropathy: Secondary | ICD-10-CM

## 2024-02-22 NOTE — Patient Instructions (Addendum)
 Continue current medications.  Check blood pressure twice daily for the next week and keep a log.  Be mindful to limit sodium and avoid caffeine.  Do not forget your upcoming appointments.   Bring your readings and your blood pressure machine and your blood sugar meter with you to my appointment on 02/24/24.  Call my direct line below with any questions or concerns.  [name], PharmD Clinical Pharmacist [phone]

## 2024-02-22 NOTE — Progress Notes (Signed)
 01/11/2024 Name: [name] MRN: [id number] DOB: 1950/11/28  Chief Complaint  Patient presents with   Medication Management   Hypertension    [name] is a 73 y.o. year old male who was referred for medication management by their primary care provider, Viola Greulich, MD. They presented for a face to face visit today.   They were referred to the pharmacist by their PCP for assistance in managing diabetes, hypertension, and complex medication management    Subjective:  Care Team: Primary Care Provider: Viola Greulich, MD  Medication Access/Adherence  [facility]  [phone] [place], [place] - [address] [place] [address] Phone: [phone] Fax: [phone]  [facility] (New Address) - [place], [place] - [address] [facility] AT Previously: [place], [place] [address] [facility] [address] 2 4th Floor [address] [place] [address] Phone: [phone] Fax: [phone]  [facility] - [place]  - [place], [place] - [address] [place] [address] Phone: [phone] Fax: [phone]   Patient reports affordability concerns with their medications: No  Patient reports access/transportation concerns to their pharmacy: No  Patient reports adherence concerns with their medications:  YES - STOPS MEDS ON HIS OWN DUE TO "NOT FEELING GOOD" when taking them all  Hypertension:  Current medications:  Carvedilol  25mg  BID Medications previously tried: Diltiazem , Metoprolol , Losartan , Lisinopril , Triamterene /hydrochlorothiazide , Amlodipine   Patient has a validated, automated, upper arm home BP cuff Current blood pressure readings: 204/108 in left arm, recheck comes down to 194/104, HR 71 for both readings. Check in right arm shows 206/102  Reports checking at home and readings mostly in 135s for SBP, does not line up at all with office  readings  Patient denies any symptoms of high or low BP   Objective:  Lab Results  Component Value Date   HGBA1C 7.4 (A) 08/31/2023    Lab Results  Component Value Date   CREATININE 2.49 (H) 08/31/2023   BUN 33 (H) 08/31/2023   NA 138 08/31/2023   K 5.0 08/31/2023   CL 102 08/31/2023   CO2 30 08/31/2023    Lab Results  Component Value Date   CHOL 214 (H) 01/23/2023   HDL 52 01/23/2023   LDLCALC 143 (H) 01/23/2023   LDLDIRECT 98 09/04/2022   TRIG 97 01/23/2023   CHOLHDL 4.1 01/23/2023    Medications Reviewed Today     Reviewed by [name], RPH (Pharmacist) on 02/22/24 at 1535  Med List Status: <None>   Medication Order Taking? Sig Documenting Provider Last Dose Status Informant  aspirin  EC 81 MG tablet [id number] Yes Take 1 tablet (81 mg total) by mouth daily. Swallow whole. Viola Greulich, MD Taking Active   atorvastatin  (LIPITOR) 80 MG tablet [id number] Yes Take 1 tablet (80 mg total) by mouth daily. Viola Greulich, MD Taking Active   blood glucose meter kit and supplies [id number]  Dispense based on patient and insurance preference. Use to check glucose daily (FOR ICD-10 E10.9, E11.9). Adela Holter, DO  Active   Blood Glucose Monitoring Suppl (ONE TOUCH ULTRA 2) w/Device KIT [id number]  Use to check blood sugars twice daily as directed. Dx E11.9 Viola Greulich, MD  Active   carvedilol  (COREG ) 25 MG tablet [id number] Yes Take 1 tablet (25 mg total) by mouth 2 (two) times daily. Viola Greulich, MD Taking Active   glucose blood [facility] ULTRA TEST) test strip [id number]  Use to check blood sugar twice daily as directed E11.9 Viola Greulich, MD  Active   insulin  isophane & regular human KwikPen (NOVOLIN  70/30 KWIKPEN) (70-30) 100 UNIT/ML KwikPen [id number] Yes Inject 20 Units into the skin daily before breakfast AND 24 Units at bedtime. Viola Greulich, MD Taking Active   Insulin  Pen Needle (BD PEN NEEDLE NANO 2ND GEN) 32G X 4 MM MISC [id number]  To use with  insulin  pen twice a day. Viola Greulich, MD  Active   Lancet Devices (ONE TOUCH DELICA LANCING DEV) MISC [id number]  1 Device by Does not apply route as directed. Shamleffer, Julian Obey, MD  Active   Lancets [facility] DELICA PLUS [place]) MISC [id number]  Use 1 each to test sugars twice daily as directed E11.9 Viola Greulich, MD  Active   latanoprost (XALATAN) 0.005 % ophthalmic solution [id number] No SMARTSIG:In Eye(s)  Patient not taking: Reported on 02/22/2024   [provider] Not Taking Active               Assessment/Plan:    Hypertension: - Currently UNCONTROLLED - Reviewed long term cardiovascular and renal outcomes of uncontrolled blood pressure - Reviewed appropriate blood pressure monitoring technique and reviewed goal blood pressure. Recommended to check home blood pressure and heart rate daily -check twice daily and bring readings to appt -Notified PCP of elevated BP, recommended patient go to ER. Patient declines. Scheduled f/u for in office in 2 days with strict precautions on heading to ER reinforced -Patient to bring BP machine to office visit to assess accuracy and check log as his machine does keep record.   Follow Up Plan: 2 days [name], PharmD Clinical Pharmacist [phone]

## 2024-02-23 ENCOUNTER — Ambulatory Visit
Admission: RE | Admit: 2024-02-23 | Discharge: 2024-02-23 | Disposition: A | Discharge status: Home / Self Care | Source: Ambulatory Visit | Attending: Family Medicine | Admitting: Family Medicine

## 2024-02-23 DIAGNOSIS — I6523 Occlusion and stenosis of bilateral carotid arteries: Secondary | ICD-10-CM | POA: Diagnosis not present

## 2024-02-24 ENCOUNTER — Ambulatory Visit (INDEPENDENT_AMBULATORY_CARE_PROVIDER_SITE_OTHER)

## 2024-02-24 DIAGNOSIS — I10 Essential (primary) hypertension: Secondary | ICD-10-CM

## 2024-02-24 MED ORDER — AMLODIPINE BESYLATE 2.5 MG PO TABS: 2.5000 mg | ORAL_TABLET | Freq: Every day | ORAL | 2 refills | Status: DC

## 2024-02-24 NOTE — Progress Notes (Signed)
 01/11/2024 Name: [name] MRN: [id number] DOB: 1951-02-07  Chief Complaint  Patient presents with   Medication Management   Hypertension    [name] is a 73 y.o. year old male who was referred for medication management by their primary care provider, [name] Greulich, MD. They presented for a face to face visit today.   They were referred to the pharmacist by their PCP for assistance in managing diabetes, hypertension, and complex medication management    Subjective:  Care Team: Primary Care Provider: Viola Greulich, MD  Medication Access/Adherence  [facility]  [phone] [place], [place] - [address] [facility] [address] [place] [address] Phone: [phone] Fax: [phone]  [facility] (New Address) - [place], [place] - [address] [facility] AT Previously: [address], [place] [address] [facility] [address] 2 4th Floor [address] [place] [address] Phone: [phone] Fax: [phone]  [facility] - [place]  - [place], [place] - [address] [place] [address] Phone: [phone] Fax: [phone]   Patient reports affordability concerns with their medications: No  Patient reports access/transportation concerns to their pharmacy: No  Patient reports adherence concerns with their medications:  YES - STOPS MEDS ON HIS OWN DUE TO "NOT FEELING GOOD" when taking them all  Hypertension:  Current medications:  Carvedilol  25mg  BID Medications previously tried: Diltiazem , Metoprolol , Losartan , Lisinopril , Triamterene /hydrochlorothiazide , Amlodipine   Patient has a validated, automated, upper arm home BP cuff Current blood pressure readings: 214/112 left arm, 210/112 right arm  Reports checking at home and readings mostly in 135s for SBP, does not line up at all with office readings  Patient denies any symptoms of high or low  BP   Objective:  Lab Results  Component Value Date   HGBA1C 7.4 (A) 08/31/2023    Lab Results  Component Value Date   CREATININE 2.49 (H) 08/31/2023   BUN 33 (H) 08/31/2023   NA 138 08/31/2023   K 5.0 08/31/2023   CL 102 08/31/2023   CO2 30 08/31/2023    Lab Results  Component Value Date   CHOL 214 (H) 01/23/2023   HDL 52 01/23/2023   LDLCALC 143 (H) 01/23/2023   LDLDIRECT 98 09/04/2022   TRIG 97 01/23/2023   CHOLHDL 4.1 01/23/2023    Medications Reviewed Today     Reviewed by [name], RPH (Pharmacist) on 02/24/24 at 1056  Med List Status: <None>   Medication Order Taking? Sig Documenting Provider Last Dose Status Informant  aspirin  EC 81 MG tablet [id number] No Take 1 tablet (81 mg total) by mouth daily. Swallow whole. [name] Greulich, MD Taking Active   atorvastatin  (LIPITOR) 80 MG tablet [id number] No Take 1 tablet (80 mg total) by mouth daily. [name] Greulich, MD Taking Active   blood glucose meter kit and supplies [id number] No Dispense based on patient and insurance preference. Use to check glucose daily (FOR ICD-10 E10.9, E11.9). Adela Holter, DO Taking Active   Blood Glucose Monitoring Suppl (ONE TOUCH ULTRA 2) w/Device KIT [id number] No Use to check blood sugars twice daily as directed. Dx E11.9 [name] Greulich, MD Taking Active   carvedilol  (COREG ) 25 MG tablet [id number] No Take 1 tablet (25 mg total) by mouth 2 (two) times daily. [name] Greulich, MD Taking Active   glucose blood (ONETOUCH ULTRA TEST) test strip [id number] No Use to check blood sugar twice daily as directed E11.9 [name] Greulich, MD  Taking Active   insulin  isophane & regular human KwikPen (NOVOLIN  70/30 KWIKPEN) (70-30) 100 UNIT/ML KwikPen [id number] No Inject 20 Units into the skin daily before breakfast AND 24 Units at bedtime. [name] Greulich, MD Taking Active   Insulin  Pen Needle (BD PEN NEEDLE NANO 2ND GEN) 32G X 4 MM MISC [id number] No To use with insulin  pen twice a day. [name] Greulich, MD Taking Active   Lancet Devices (ONE TOUCH DELICA LANCING DEV) MISC [id number] No 1 Device by Does not apply route as directed. Shamleffer, Julian Obey, MD Taking Active   Lancets [facility] DELICA PLUS [place]) MISC [id number] No Use 1 each to test sugars twice daily as directed E11.9 [name] Greulich, MD Taking Active   latanoprost (XALATAN) 0.005 % ophthalmic solution [id number] No SMARTSIG:In Eye(s)  Patient not taking: Reported on 02/22/2024   [provider] Not Taking Active               Assessment/Plan:    Hypertension: - Currently UNCONTROLLED - Reviewed long term cardiovascular and renal outcomes of uncontrolled blood pressure - Reviewed appropriate blood pressure monitoring technique and reviewed goal blood pressure. Recommended to check home blood pressure and heart rate daily -check twice daily and bring readings to appt -Patient again declines to go to ER for elevated BP. START Amlodipine  2.5mg  daily. Cautioned on strict ER precautions   Follow Up Plan: 1 week [name], PharmD Clinical Pharmacist [phone]

## 2024-02-25 ENCOUNTER — Other Ambulatory Visit: Payer: Self-pay | Admitting: Family Medicine

## 2024-02-25 ENCOUNTER — Encounter: Payer: Self-pay | Admitting: Family Medicine

## 2024-02-25 DIAGNOSIS — E1169 Type 2 diabetes mellitus with other specified complication: Secondary | ICD-10-CM

## 2024-02-29 ENCOUNTER — Ambulatory Visit

## 2024-03-02 ENCOUNTER — Other Ambulatory Visit

## 2024-03-02 ENCOUNTER — Ambulatory Visit

## 2024-03-02 ENCOUNTER — Ambulatory Visit (INDEPENDENT_AMBULATORY_CARE_PROVIDER_SITE_OTHER)

## 2024-03-02 DIAGNOSIS — Z794 Long term (current) use of insulin: Secondary | ICD-10-CM

## 2024-03-02 DIAGNOSIS — I10 Essential (primary) hypertension: Secondary | ICD-10-CM

## 2024-03-02 DIAGNOSIS — E1142 Type 2 diabetes mellitus with diabetic polyneuropathy: Secondary | ICD-10-CM

## 2024-03-02 NOTE — Progress Notes (Signed)
 01/11/2024 Name: [name] MRN: [id number] DOB: 04-Oct-1951  Chief Complaint  Patient presents with   Medication Management   Hypertension   Diabetes    [name] is a 73 y.o. year old male who was referred for medication management by their primary care provider, Viola Greulich, MD. They presented for a face to face visit today.   They were referred to the pharmacist by their PCP for assistance in managing diabetes, hypertension, and complex medication management    Subjective:  Care Team: Primary Care Provider: Viola Greulich, MD  Medication Access/Adherence  [facility]  [phone] [place], [place] - [address] [facility] [address] [place] [address] Phone: [phone] Fax: [phone]  [facility] (New Address) - [place], [place] - [address] [facility] AT Previously: [address], [place] [address] [facility] [address] 2 4th Floor [address] [place] [address] Phone: [phone] Fax: [phone]  [facility] - [place]  - [place], [place] - [address] [place] [address] Phone: [phone] Fax: [phone]   Patient reports affordability concerns with their medications: No  Patient reports access/transportation concerns to their pharmacy: No  Patient reports adherence concerns with their medications:  YES - STOPS MEDS ON HIS OWN DUE TO "NOT FEELING GOOD" when taking them all  Hypertension:  Current medications:  Carvedilol  25mg  BID, Amlodipine  2.5mg  Medications previously tried: Diltiazem , Metoprolol , Losartan , Lisinopril , Triamterene /hydrochlorothiazide ,  Patient has a validated, automated, upper arm home BP cuff Current blood pressure readings: 162/91  Did not bring BP log to appt today but notes readings similar to office reading today  Patient denies any symptoms of high or low BP  Diabetes:  Current medications: Novolin  70/30  BID Medications tried in the past: None (given farxiga  samples in past but did not take)  Current glucose readings: Cannot report and does not have meter. States he tries to check once daily  Denies any sugars below 70, reports highest he can recall is 287 and that is rare, normally stays under 200 when he checks  Observed patterns:  Patient denies hypoglycemic s/sx including dizziness, shakiness, sweating. Patient denies hyperglycemic symptoms including polyuria, polydipsia, polyphagia, nocturia, neuropathy, blurred vision.   Objective:  Lab Results  Component Value Date   HGBA1C 7.4 (A) 08/31/2023    Lab Results  Component Value Date   CREATININE 2.49 (H) 08/31/2023   BUN 33 (H) 08/31/2023   NA 138 08/31/2023   K 5.0 08/31/2023   CL 102 08/31/2023   CO2 30 08/31/2023    Lab Results  Component Value Date   CHOL 214 (H) 01/23/2023   HDL 52 01/23/2023   LDLCALC 143 (H) 01/23/2023   LDLDIRECT 98 09/04/2022   TRIG 97 01/23/2023   CHOLHDL 4.1 01/23/2023    Medications Reviewed Today     Reviewed by [name], RPH (Pharmacist) on 03/02/24 at 1518  Med List Status: <None>   Medication Order Taking? Sig Documenting Provider Last Dose Status Informant  amLODipine  (NORVASC ) 2.5 MG tablet [id number] Yes Take 1 tablet (2.5 mg total) by mouth daily. Viola Greulich, MD Taking Active   ASPIRIN  LOW DOSE 81 MG tablet [id number] Yes TAKE 1 TABLET (81 MG TOTAL) BY MOUTH DAILY. SWALLOW WHOLE. Viola Greulich, MD Taking Active   atorvastatin  (LIPITOR) 80 MG tablet [id number] Yes TAKE 1 TABLET BY MOUTH EVERY DAY Viola Greulich, MD Taking Active   blood glucose meter kit and  supplies [id number]  Dispense based on patient and insurance preference. Use to check glucose daily (FOR ICD-10 E10.9, E11.9). Adela Holter, DO  Active   Blood Glucose Monitoring Suppl (ONE TOUCH ULTRA 2) w/Device KIT [id number]  Use to check blood sugars twice daily as directed. Dx E11.9 Viola Greulich, MD   Active   carvedilol  (COREG ) 25 MG tablet [id number] Yes TAKE 1 TABLET BY MOUTH TWICE A DAY Viola Greulich, MD Taking Active   glucose blood (ONETOUCH ULTRA TEST) test strip [id number]  Use to check blood sugar twice daily as directed E11.9 Viola Greulich, MD  Active   insulin  isophane & regular human KwikPen (NOVOLIN  70/30 KWIKPEN) (70-30) 100 UNIT/ML KwikPen [id number] Yes Inject 20 Units into the skin daily before breakfast AND 24 Units at bedtime. Viola Greulich, MD Taking Active   Insulin  Pen Needle (BD PEN NEEDLE NANO 2ND GEN) 32G X 4 MM MISC [id number]  To use with insulin  pen twice a day. Viola Greulich, MD  Active   Lancet Devices (ONE TOUCH DELICA LANCING DEV) MISC [id number]  1 Device by Does not apply route as directed. Shamleffer, Julian Obey, MD  Active   Lancets [facility] DELICA PLUS [place]) MISC [id number]  Use 1 each to test sugars twice daily as directed E11.9 Viola Greulich, MD  Active   latanoprost (XALATAN) 0.005 % ophthalmic solution [id number]  SMARTSIG:In Eye(s)  Patient not taking: Reported on 02/22/2024   [provider]  Active               Assessment/Plan:    Hypertension: - Currently uncontrolled but improving - Reviewed long term cardiovascular and renal outcomes of uncontrolled blood pressure - Reviewed appropriate blood pressure monitoring technique and reviewed goal blood pressure. Recommended to check home blood pressure and heart rate daily -check twice daily and bring readings to appt -Continue current medication therapy to allow amlodipine  dose time to take full effect. Follow up in 1 week and consider amlodipine  dose increase if still elevated  Diabetes: - Currently uncontrolled - Reviewed long term cardiovascular and renal outcomes of uncontrolled blood sugar - Reviewed goal A1c, goal fasting, and goal 2 hour post prandial glucose - Reviewed dietary modifications including low carb diet - Recommend to continue current  medication therapy  - Patient denies personal or family history of multiple endocrine neoplasia type 2, medullary thyroid  cancer; personal history of pancreatitis or gallbladder disease. - Recommend to check glucose once daily -Due for updated A1c at next PCP visit (patient does not wish to see endo again at this time) -uACR elevated but patient has hx of cough with ACEI/ARB -Consider trial of farxiga  in future if patient will reconsider      Follow Up Plan: 1 week [name], PharmD Clinical Pharmacist [phone]

## 2024-03-09 ENCOUNTER — Ambulatory Visit

## 2024-03-16 ENCOUNTER — Ambulatory Visit (INDEPENDENT_AMBULATORY_CARE_PROVIDER_SITE_OTHER)

## 2024-03-16 VITALS — BP 168/92 | HR 72

## 2024-03-16 DIAGNOSIS — Z794 Long term (current) use of insulin: Secondary | ICD-10-CM

## 2024-03-16 DIAGNOSIS — I10 Essential (primary) hypertension: Secondary | ICD-10-CM

## 2024-03-16 DIAGNOSIS — E1142 Type 2 diabetes mellitus with diabetic polyneuropathy: Secondary | ICD-10-CM

## 2024-03-16 NOTE — Progress Notes (Signed)
 01/11/2024 Name: [name] MRN: [id number] DOB: 1950/12/26  Chief Complaint  Patient presents with   Medication Management   Hypertension    Fortunato Nordin is a 73 y.o. year old male who was referred for medication management by their primary care provider, Viola Greulich, MD. They presented for a face to face visit today.   They were referred to the pharmacist by their PCP for assistance in managing diabetes, hypertension, and complex medication management    Subjective:  Care Team: Primary Care Provider: Viola Greulich, MD  Medication Access/Adherence  [facility] Pharmacy:  [phone] [place] [place] - [address] [place] [address] Phone: [phone] Fax: [phone]  [facility] (New Address) - [place], [place] - [address] [facility] AT Previously: [address], [place] [address] [facility] [address] 2 4th Floor [address] [place] [address] Phone: [phone] Fax: [phone]  [facility] - [place]  - [place], [place] - [address] [place] [address] Phone: [phone] Fax: [phone]   Patient reports affordability concerns with their medications: No  Patient reports access/transportation concerns to their pharmacy: No  Patient reports adherence concerns with their medications:  YES - STOPS MEDS ON HIS OWN DUE TO "NOT FEELING GOOD" when taking them all  Hypertension:  Current medications:  Carvedilol  25mg  BID, Amlodipine  2.5mg  Medications previously tried: Diltiazem , Metoprolol , Losartan , Lisinopril , Triamterene /hydrochlorothiazide ,  Patient has a validated, automated, upper arm home BP cuff Current blood pressure readings: 168/92, does not come down on recheck. Did not take medication this morning before coming to appointment, reports he did not have time  Brought BP machine and readings all over the place, ranging from  94-147 for SBP and 40s-90s for DBP  Does report that he does not necessarily take his medications around the same time each day due to varying work schedules  Drinks 2-3 16oz bottles of water daily  Patient denies any symptoms of high or low BP  Diabetes:  Current medications: Novolin  70/30 BID Medications tried in the past: None (given farxiga  samples in past but did not take)  Current glucose readings: Cannot report and does not have meter. States he tries to check once daily  Denies any sugars below 70, reports highest he can recall is 287 and that is rare, normally stays under 200 when he checks  Observed patterns:  Patient denies hypoglycemic s/sx including dizziness, shakiness, sweating. Patient denies hyperglycemic symptoms including polyuria, polydipsia, polyphagia, nocturia, neuropathy, blurred vision.   Objective:  Lab Results  Component Value Date   HGBA1C 7.4 (A) 08/31/2023    Lab Results  Component Value Date   CREATININE 2.49 (H) 08/31/2023   BUN 33 (H) 08/31/2023   NA 138 08/31/2023   K 5.0 08/31/2023   CL 102 08/31/2023   CO2 30 08/31/2023    Lab Results  Component Value Date   CHOL 214 (H) 01/23/2023   HDL 52 01/23/2023   LDLCALC 143 (H) 01/23/2023   LDLDIRECT 98 09/04/2022   TRIG 97 01/23/2023   CHOLHDL 4.1 01/23/2023    Medications Reviewed Today     Reviewed by [name], RPH (Pharmacist) on 03/16/24 at [phone]  Med List Status: <None>   Medication Order Taking? Sig Documenting Provider Last Dose Status Informant  amLODipine  (NORVASC ) 2.5 MG tablet [id number] Yes Take 1 tablet (2.5 mg total) by mouth daily. Viola Greulich, MD Taking Active   ASPIRIN  LOW DOSE  81 MG tablet [id number]  TAKE 1 TABLET (81 MG TOTAL) BY MOUTH DAILY. SWALLOW WHOLE. Viola Greulich, MD  Active   atorvastatin  (LIPITOR) 80 MG tablet [id number] Yes TAKE 1 TABLET BY MOUTH EVERY DAY Viola Greulich, MD Taking Active   blood glucose meter kit and supplies [id number]   Dispense based on patient and insurance preference. Use to check glucose daily (FOR ICD-10 E10.9, E11.9). Adela Holter, DO  Active   Blood Glucose Monitoring Suppl (ONE TOUCH ULTRA 2) w/Device KIT [id number]  Use to check blood sugars twice daily as directed. Dx E11.9 Viola Greulich, MD  Active   carvedilol  (COREG ) 25 MG tablet [id number] Yes TAKE 1 TABLET BY MOUTH TWICE A DAY Viola Greulich, MD Taking Active   glucose blood (ONETOUCH ULTRA TEST) test strip [id number]  Use to check blood sugar twice daily as directed E11.9 Viola Greulich, MD  Active   insulin  isophane & regular human KwikPen (NOVOLIN  70/30 KWIKPEN) (70-30) 100 UNIT/ML KwikPen [id number] Yes Inject 20 Units into the skin daily before breakfast AND 24 Units at bedtime. Viola Greulich, MD Taking Active   Insulin  Pen Needle (BD PEN NEEDLE NANO 2ND GEN) 32G X 4 MM MISC [id number]  To use with insulin  pen twice a day. Viola Greulich, MD  Active   Lancet Devices (ONE TOUCH DELICA LANCING DEV) MISC [id number]  1 Device by Does not apply route as directed. Shamleffer, Julian Obey, MD  Active   Lancets [facility] DELICA PLUS [place]) MISC [id number]  Use 1 each to test sugars twice daily as directed E11.9 Viola Greulich, MD  Active   latanoprost (XALATAN) 0.005 % ophthalmic solution [id number]  SMARTSIG:In Eye(s)  Patient not taking: Reported on 02/22/2024   [provider]  Active               Assessment/Plan:    Hypertension: - Currently uncontrolled  - Reviewed long term cardiovascular and renal outcomes of uncontrolled blood pressure - Reviewed appropriate blood pressure monitoring technique and reviewed goal blood pressure. Recommended to check home blood pressure and heart rate daily -check twice daily and bring readings to appt -Continue current medication therapy. Counseled on hydration to address low BP episodes and taking medication at consistent times. Consider switching amlodipine  to spironolactone  if BP swings continue as patient has tried and failed ACEI/ARB/thiazide diuretics in the past. Would need updated CMP  Diabetes: - Currently uncontrolled - Reviewed long term cardiovascular and renal outcomes of uncontrolled blood sugar - Reviewed goal A1c, goal fasting, and goal 2 hour post prandial glucose - Reviewed dietary modifications including low carb diet - Recommend to continue current medication therapy  - Patient denies personal or family history of multiple endocrine neoplasia type 2, medullary thyroid  cancer; personal history of pancreatitis or gallbladder disease. - Recommend to check glucose once daily -Due for updated A1c at next PCP visit (patient does not wish to see endo again at this time) -uACR elevated but patient has hx of cough with ACEI/ARB -Consider trial of farxiga  in future if patient will reconsider      Follow Up Plan: 3 weeks (sees PCP in 1 week) [name], PharmD Clinical Pharmacist [phone]

## 2024-03-23 ENCOUNTER — Encounter: Payer: Self-pay | Admitting: Family Medicine

## 2024-03-23 ENCOUNTER — Ambulatory Visit (INDEPENDENT_AMBULATORY_CARE_PROVIDER_SITE_OTHER): Admitting: Family Medicine

## 2024-03-23 VITALS — BP 154/86 | HR 74 | Temp 98.0°F | Ht 66.0 in | Wt 152.6 lb

## 2024-03-23 DIAGNOSIS — E785 Hyperlipidemia, unspecified: Secondary | ICD-10-CM | POA: Diagnosis not present

## 2024-03-23 DIAGNOSIS — S90821A Blister (nonthermal), right foot, initial encounter: Secondary | ICD-10-CM | POA: Diagnosis not present

## 2024-03-23 DIAGNOSIS — Z794 Long term (current) use of insulin: Secondary | ICD-10-CM

## 2024-03-23 DIAGNOSIS — E1142 Type 2 diabetes mellitus with diabetic polyneuropathy: Secondary | ICD-10-CM

## 2024-03-23 DIAGNOSIS — N184 Chronic kidney disease, stage 4 (severe): Secondary | ICD-10-CM | POA: Diagnosis not present

## 2024-03-23 DIAGNOSIS — L84 Corns and callosities: Secondary | ICD-10-CM | POA: Diagnosis not present

## 2024-03-23 DIAGNOSIS — I10 Essential (primary) hypertension: Secondary | ICD-10-CM

## 2024-03-23 DIAGNOSIS — D509 Iron deficiency anemia, unspecified: Secondary | ICD-10-CM

## 2024-03-23 DIAGNOSIS — E1169 Type 2 diabetes mellitus with other specified complication: Secondary | ICD-10-CM

## 2024-03-23 DIAGNOSIS — Z8546 Personal history of malignant neoplasm of prostate: Secondary | ICD-10-CM | POA: Diagnosis not present

## 2024-03-23 LAB — LIPID PANEL
Cholesterol: 153 mg/dL (ref 0–200)
HDL: 50.3 mg/dL (ref >39.00)
LDL Cholesterol: 86 mg/dL (ref 0–99)
NonHDL: 102.59
Total CHOL/HDL Ratio: 3
Triglycerides: 83 mg/dL (ref 0.0–149.0)
VLDL: 16.6 mg/dL (ref 0.0–40.0)

## 2024-03-23 LAB — COMPREHENSIVE METABOLIC PANEL WITH GFR
ALT: 12 U/L (ref 0–53)
AST: 14 U/L (ref 0–37)
Albumin: 4 g/dL (ref 3.5–5.2)
Alkaline Phosphatase: 121 U/L — ABNORMAL HIGH (ref 39–117)
BUN: 33 mg/dL — ABNORMAL HIGH (ref 6–23)
CO2: 29 meq/L (ref 19–32)
Calcium: 9.2 mg/dL (ref 8.4–10.5)
Chloride: 102 meq/L (ref 96–112)
Creatinine, Ser: 2.25 mg/dL — ABNORMAL HIGH (ref 0.40–1.50)
GFR: 28.56 mL/min — ABNORMAL LOW (ref >60.00)
Glucose, Bld: 291 mg/dL — ABNORMAL HIGH (ref 70–99)
Potassium: 4.4 meq/L (ref 3.5–5.1)
Sodium: 140 meq/L (ref 135–145)
Total Bilirubin: 0.4 mg/dL (ref 0.2–1.2)
Total Protein: 6.6 g/dL (ref 6.0–8.3)

## 2024-03-23 LAB — CBC WITH DIFFERENTIAL/PLATELET
Basophils Absolute: 0 10*3/uL (ref 0.0–0.1)
Basophils Relative: 0.7 % (ref 0.0–3.0)
Eosinophils Absolute: 0.1 10*3/uL (ref 0.0–0.7)
Eosinophils Relative: 4 % (ref 0.0–5.0)
HCT: 34.5 % — ABNORMAL LOW (ref 39.0–52.0)
Hemoglobin: 12.3 g/dL — ABNORMAL LOW (ref 13.0–17.0)
Lymphocytes Relative: 24.1 % (ref 12.0–46.0)
Lymphs Abs: 0.9 10*3/uL (ref 0.7–4.0)
MCHC: 35.7 g/dL (ref 30.0–36.0)
MCV: 89 fl (ref 78.0–100.0)
Monocytes Absolute: 0.4 10*3/uL (ref 0.1–1.0)
Monocytes Relative: 10.5 % (ref 3.0–12.0)
Neutro Abs: 2.2 10*3/uL (ref 1.4–7.7)
Neutrophils Relative %: 60.7 % (ref 43.0–77.0)
Platelets: 200 10*3/uL (ref 150.0–400.0)
RBC: 3.88 Mil/uL — ABNORMAL LOW (ref 4.22–5.81)
RDW: 13.2 % (ref 11.5–15.5)
WBC: 3.6 10*3/uL — ABNORMAL LOW (ref 4.0–10.5)

## 2024-03-23 LAB — HEMOGLOBIN A1C: Hgb A1c MFr Bld: 9.4 % — ABNORMAL HIGH (ref 4.6–6.5)

## 2024-03-23 LAB — PSA, MEDICARE: PSA: 0.21 ng/mL (ref 0.10–4.00)

## 2024-03-23 LAB — TSH: TSH: 2.04 u[IU]/mL (ref 0.35–5.50)

## 2024-03-23 NOTE — Patient Instructions (Addendum)
 Make sure you schedule an appointment with the podiatrist for the sore on your foot.  I placed a referral again just in case.  Will send you to the lab to check on your kidney function.  Continue working on decreasing the salt intake by cutting out the frozen meals.

## 2024-03-23 NOTE — Progress Notes (Signed)
 Established Patient Office Visit   Subjective  Patient ID: [name], male    DOB: 30-Jul-1951  Age: 73 y.o. MRN: [id number]  Chief Complaint  Patient presents with   Medical Management of Chronic Issues    Follow-up for Blood pressure and Diabetes     Patient is a 73 year old male seen for follow-up.  Patient states blood sugar and BP are up-and-down at home.  Does not have meter/monitor with him this visit.  States blood sugar 180 yesterday and BP 143/78 at home.  Patient has not taken medications this morning as he has not eaten anything yet.  Pt working with clinic pharmacist, Redmond Candle on medication management.  Taking Novolin  70/30 20 units and 24 units.  Patient notes a sore on the bottom of right foot that was bleeding.  States area is not tender.  No recent follow-up with podiatry.  States he does not want to go back in the hyperbaric chamber as it affected his distance vision.  Patient states he is taking 4 medications in total including aspirin .  States he was on too many meds as they were causing dizziness.  Has eye exam scheduled next month.    Patient Active Problem List   Diagnosis Date Noted   Diabetic ulcer of toe of right foot associated with type 2 diabetes mellitus (HCC) 07/03/2023   Chronic kidney disease, stage 3b (HCC) 01/23/2023   TIA (transient ischemic attack) 01/22/2023   Delayed sleep phase syndrome 11/13/2020   Prostate cancer (HCC) 09/16/2020   Severe nonproliferative diabetic retinopathy of right eye with macular edema associated with type 2 diabetes mellitus (HCC) 08/28/2020   Age-related nuclear cataract, bilateral 08/28/2020   Hypertensive retinopathy of both eyes 08/28/2020   Bilateral carotid artery stenosis 08/28/2020   Murmur 08/28/2020   Diabetic macular edema (HCC) 07/13/2020   Mild nonproliferative diabetic retinopathy of right eye with macular edema associated with type 2 diabetes mellitus (HCC) 07/13/2020   BPH associated with  nocturia 02/21/2020   Type 2 diabetes mellitus with diabetic polyneuropathy, without long-term current use of insulin  (HCC) 07/06/2019   Dyslipidemia 07/06/2019   ED (erectile dysfunction) 12/24/2018   Osteoarthritis 11/25/2018   Uncontrolled type 2 diabetes mellitus with hyperglycemia (HCC) 11/12/2018   Hyperlipidemia associated with type 2 diabetes mellitus (HCC) 11/12/2018   Malignant hypertension 11/10/2018   Insomnia 11/10/2018   Diabetes (HCC) 01/23/2016   Depression 01/22/2016   Past Medical History:  Diagnosis Date   CKD (chronic kidney disease)    Diabetes mellitus without complication (HCC)    Hyperlipidemia    Hypertension    Insomnia    Prostate cancer (HCC)    Past Surgical History:  Procedure Laterality Date   PROSTATE BIOPSY     Social History   Tobacco Use   Smoking status: Never   Smokeless tobacco: Never  Vaping Use   Vaping status: Never Used  Substance Use Topics   Alcohol use: No   Drug use: No   Family History  Problem Relation Age of Onset   Hypertension Mother    Colon cancer Mother    Diabetes Brother    Hypertension Daughter    Breast cancer Neg Hx    Pancreatic cancer Neg Hx    Prostate cancer Neg Hx    No Known Allergies  ROS Negative unless stated above    Objective:      BP (!) 154/86 (BP Location: Right Arm, Patient Position: Sitting, Cuff Size: Normal)   Pulse 74  Temp 98 F (36.7 C) (Oral)   Ht 5\' 6"  (1.676 m)   Wt 152 lb 9.6 oz (69.2 kg)   SpO2 98%   BMI 24.63 kg/m  BP Readings from Last 3 Encounters:  03/23/24 (!) 154/86  03/16/24 (!) 168/92  11/11/23 (!) 141/75   Wt Readings from Last 3 Encounters:  03/23/24 152 lb 9.6 oz (69.2 kg)  11/11/23 159 lb (72.1 kg)  09/02/23 162 lb 6.4 oz (73.7 kg)      Physical Exam Constitutional:      General: He is not in acute distress.    Appearance: Normal appearance.  HENT:     Head: Normocephalic and atraumatic.     Nose: Nose normal.     Mouth/Throat:      Mouth: Mucous membranes are moist.  Cardiovascular:     Rate and Rhythm: Normal rate and regular rhythm.     Heart sounds: Normal heart sounds. No murmur heard.    No gallop.  Pulmonary:     Effort: Pulmonary effort is normal. No respiratory distress.     Breath sounds: Normal breath sounds. No wheezing, rhonchi or rales.  Skin:    General: Skin is warm and dry.     Comments: Diabetic Foot Exam - Simple  Simple Foot Form Diabetic Foot exam was performed with the following findings: Yes 03/23/2024  9:15 AM Visual Inspection See comments: Yes Sensation Testing Intact to touch and monofilament testing bilaterally: Yes See comments: Yes Pulse Check Posterior Tibialis and Dorsalis pulse intact bilaterally: Yes Comments Thick calluses of plantar surface of bilateral feet.  A thickened ulceration of plantar surface of right foot at fifth MTP joint with dried blood visible.  Two light red blisters forming on plantar surface of right 1st and 2nd digits.  Hypertrophic toenails.  Dry skin of bilateral feet.  Decreased sensation to vibratory sense and monofilament bilaterally.    Neurological:     Mental Status: He is alert and oriented to person, place, and time.        03/23/2024    8:55 AM 09/02/2023    4:14 PM 01/07/2023   11:02 AM  Depression screen PHQ 2/9  Decreased Interest  0 0  Down, Depressed, Hopeless  0 0  PHQ - 2 Score  0 0  Altered sleeping 0 0 0  Tired, decreased energy 2 0 0  Change in appetite 0 0 0  Feeling bad or failure about yourself  0 0 0  Trouble concentrating 1 0 0  Moving slowly or fidgety/restless 0 0 0  Suicidal thoughts 0 0 0  PHQ-9 Score  0 0  Difficult doing work/chores Not difficult at all Not difficult at all Not difficult at all      03/23/2024    8:55 AM 09/02/2023    4:14 PM 11/26/2022   11:11 AM  GAD 7 : Generalized Anxiety Score  Nervous, Anxious, on Edge 0 0 0  Control/stop worrying 0 0 0  Worry too much - different things 0 0 0  Trouble  relaxing 1 0 0  Restless 0 0 0  Easily annoyed or irritable 0 0 0  Afraid - awful might happen 0 0 0  Total GAD 7 Score 1 0 0  Anxiety Difficulty Not difficult at all Not difficult at all Not difficult at all   Diabetic Foot Exam - Simple   Simple Foot Form Diabetic Foot exam was performed with the following findings: Yes 03/23/2024  9:15 AM  Visual  Inspection See comments: Yes Sensation Testing Intact to touch and monofilament testing bilaterally: Yes See comments: Yes Pulse Check Posterior Tibialis and Dorsalis pulse intact bilaterally: Yes Comments Thick calluses of plantar surface of bilateral feet.  A thickened ulceration of plantar surface of right foot at fifth MTP joint with dried blood visible.  Two light red blisters forming on plantar surface of right 1st and 2nd digits.  Hypertrophic toenails.  Dry skin of bilateral feet.  Decreased sensation to vibratory sense and monofilament bilaterally.      No results found for any visits on 03/23/24.    Assessment & Plan:   Type 2 diabetes mellitus with diabetic polyneuropathy, with long-term current use of insulin  (HCC) -     Comprehensive metabolic panel with GFR; Future -     Hemoglobin A1c; Future -     Ambulatory referral to Podiatry  CKD (chronic kidney disease) stage 4, GFR 15-29 ml/min (HCC) -     CBC with Differential/Platelet; Future -     Comprehensive metabolic panel with GFR; Future  Malignant hypertension -     CBC with Differential/Platelet; Future -     Comprehensive metabolic panel with GFR; Future -     TSH; Future  Hyperlipidemia associated with type 2 diabetes mellitus (HCC) -     Comprehensive metabolic panel with GFR; Future -     Lipid panel; Future  Callus of foot -     Ambulatory referral to Podiatry  Blister of right foot without infection, initial encounter -     CBC with Differential/Platelet; Future -     Ambulatory referral to Podiatry  History of prostate cancer -     PSA,  Medicare  Pt with diabetic polyneuropathy and CKD stage IV.  Hemoglobin A1c 7.4% on 08/31/2023.  Blood sugar and blood pressure readings liable per patient's memory.  Last GFR 25.39 on 08/31/2023.  Microalbumin creatinine ratio elevated at 78.6.  Patient declines Farxiga  or other additional medication.  Has history of cough with ACE I/ARB.  Discussed the importance of lifestyle modifications including limiting intake of frozen meals and decreasing carb intake.  Obtain labs this visit.  Eye exam scheduled next month.  Foot exam done this visit.  Patient with bleeding callus and blister formation.  Advised to schedule follow-up with podiatry.  BP uncontrolled.  Patient has not taken medication this morning.  Recheck remains elevated.  Previously sent to cardiology hypertension clinic, declines follow-up with them.  Discussed lifestyle modifications.  Advised to continue current medications.  Discussed the risk associated with uncontrolled blood pressure.  Patient advised to give medications a chance.  Patient used to being high for so long that lower blood pressure makes him feel symptomatic.  Recheck cholesterol.  Advised to take atorvastatin  40 mg daily.  Lifestyle modifications.  Return in about 3 months (around 06/23/2024) for chronic conditions.   Viola Greulich, MD

## 2024-03-25 ENCOUNTER — Ambulatory Visit: Payer: Self-pay | Admitting: Family Medicine

## 2024-04-06 ENCOUNTER — Ambulatory Visit (INDEPENDENT_AMBULATORY_CARE_PROVIDER_SITE_OTHER)

## 2024-04-06 DIAGNOSIS — N184 Chronic kidney disease, stage 4 (severe): Secondary | ICD-10-CM

## 2024-04-06 DIAGNOSIS — I10 Essential (primary) hypertension: Secondary | ICD-10-CM

## 2024-04-06 DIAGNOSIS — Z794 Long term (current) use of insulin: Secondary | ICD-10-CM

## 2024-04-06 DIAGNOSIS — E1142 Type 2 diabetes mellitus with diabetic polyneuropathy: Secondary | ICD-10-CM

## 2024-04-06 NOTE — Patient Instructions (Signed)
 START Farxiga  10mg  1 tablet by mouth once daily. Samples have been provided to you. Restart Iron supplement otc that you had stopped Continue other medications as prescribed Check blood pressure twice daily Check blood sugars twice daily Bring logs with you to appointment and meter  Contact us  sooner than scheduled follow up with any concerns

## 2024-04-06 NOTE — Progress Notes (Signed)
 01/11/2024 Name: [name] MRN: [id number] DOB: Feb 13, 1951  Chief Complaint  Patient presents with   Medication Management   Diabetes   Hypertension    [name] is a 73 y.o. year old male who was referred for medication management by their primary care provider, [name] Greulich, MD. They presented for a face to face visit today.   They were referred to the pharmacist by their PCP for assistance in managing diabetes, hypertension, and complex medication management    Subjective:  Care Team: Primary Care Provider: Viola Greulich, MD  Medication Access/Adherence  [facility] Pharmacy[facility]  [facility] [phone] [place], [place] - [address] [name] [address] Phone: [phone] Fax: [phone]  [facility] [place] (New Address) - [place], [place] - [address] [facility] AT Previously: [name], [place] [address] [facility] [address] 2 4th Floor [address] [place] [address] Phone: [phone] Fax: [phone]  [facility] - [place]  - [place], [place] - [address] [place] [address] Phone: [phone] Fax: [phone]   Patient reports affordability concerns with their medications: No  Patient reports access/transportation concerns to their pharmacy: No  Patient reports adherence concerns with their medications:  YES - STOPS MEDS ON HIS OWN DUE TO NOT FEELING GOOD when taking them all  Hypertension:  Current medications:  Carvedilol  25mg  BID, Amlodipine  2.5mg  Medications previously tried: Diltiazem , Metoprolol , Losartan , Lisinopril , Triamterene /hydrochlorothiazide ,  Patient has a validated, automated, upper arm home BP cuff Current blood pressure readings: 168/94, does not come down on recheck. Did not take medication this morning before coming to appointment, reports he did not have time  Did not bring a log of BP readings with him  Does report  that he does not necessarily take his medications around the same time each day due to varying work schedules  Drinks 2-3 16oz bottles of water daily  Patient denies any symptoms of high or low BP  Diabetes:  Current medications: Novolin  70/30 BID (admits to maybe missing some doses) Medications tried in the past: None (given farxiga  samples in past but did not take)  Current glucose readings: Cannot report and does not have meter with him. States he tries to check once daily. Says he has seen some at 8 though he thinks  Observed patterns:  Patient denies hypoglycemic s/sx including dizziness, shakiness, sweating. Patient denies hyperglycemic symptoms including polyuria, polydipsia, polyphagia, nocturia, neuropathy, blurred vision.   Objective:  Lab Results  Component Value Date   HGBA1C 9.4 (H) 03/23/2024    Lab Results  Component Value Date   CREATININE 2.25 (H) 03/23/2024   BUN 33 (H) 03/23/2024   NA 140 03/23/2024   K 4.4 03/23/2024   CL 102 03/23/2024   CO2 29 03/23/2024    Lab Results  Component Value Date   CHOL 153 03/23/2024   HDL 50.30 03/23/2024   LDLCALC 86 03/23/2024   LDLDIRECT 98 09/04/2022   TRIG 83.0 03/23/2024   CHOLHDL 3 03/23/2024    Medications Reviewed Today     Reviewed by [name], RPH (Pharmacist) on 04/06/24 at 1056  Med List Status: <None>   Medication Order Taking? Sig Documenting Provider Last Dose Status Informant  amLODipine  (NORVASC ) 2.5 MG tablet [id number] Yes Take 1 tablet (2.5 mg total) by mouth daily. [name] Greulich, MD Taking Active   ASPIRIN  LOW DOSE 81 MG tablet [id number] Yes TAKE 1 TABLET (81 MG TOTAL) BY MOUTH  DAILY. SWALLOW WHOLE. [name] Greulich, MD Taking Active   atorvastatin  (LIPITOR) 80 MG tablet [id number] Yes TAKE 1 TABLET BY MOUTH EVERY DAY [name] Greulich, MD Taking Active   blood glucose meter kit and supplies [id number]  Dispense based on patient and insurance preference. Use to check glucose daily  (FOR ICD-10 E10.9, E11.9). Adela Holter, DO  Active   Blood Glucose Monitoring Suppl (ONE TOUCH ULTRA 2) w/Device KIT [id number]  Use to check blood sugars twice daily as directed. Dx E11.9 [name] Greulich, MD  Active   carvedilol  (COREG ) 25 MG tablet [id number] Yes TAKE 1 TABLET BY MOUTH TWICE A DAY [name] Greulich, MD Taking Active   glucose blood (ONETOUCH ULTRA TEST) test strip [id number]  Use to check blood sugar twice daily as directed E11.9 [name] Greulich, MD  Active   insulin  isophane & regular human KwikPen (NOVOLIN  70/30 KWIKPEN) (70-30) 100 UNIT/ML KwikPen [id number] Yes Inject 20 Units into the skin daily before breakfast AND 24 Units at bedtime. [name] Greulich, MD Taking Active   Insulin  Pen Needle (BD PEN NEEDLE NANO 2ND GEN) 32G X 4 MM MISC [id number]  To use with insulin  pen twice a day. [name] Greulich, MD  Active   Lancet Devices (ONE TOUCH DELICA LANCING DEV) MISC [id number]  1 Device by Does not apply route as directed. Shamleffer, Julian Obey, MD  Active   Lancets [facility] DELICA PLUS [place]) MISC [id number]  Use 1 each to test sugars twice daily as directed E11.9 [name] Greulich, MD  Active   latanoprost (XALATAN) 0.005 % ophthalmic solution [id number]   [provider]  Active               Assessment/Plan:    Hypertension: - Currently uncontrolled  - Reviewed long term cardiovascular and renal outcomes of uncontrolled blood pressure - Reviewed appropriate blood pressure monitoring technique and reviewed goal blood pressure. Recommended to check home blood pressure and heart rate daily -check twice daily and bring readings to appt -Continue current medication therapy. Counseled on hydration to address low BP episodes and taking medication at consistent times. Consider switching amlodipine  to spironolactone if BP swings continue as patient has tried and failed ACEI/ARB/thiazide diuretics in the past.   Diabetes: - Currently uncontrolled -  Reviewed long term cardiovascular and renal outcomes of uncontrolled blood sugar - Reviewed goal A1c, goal fasting, and goal 2 hour post prandial glucose - Reviewed dietary modifications including low carb diet - Patient denies personal or family history of multiple endocrine neoplasia type 2, medullary thyroid  cancer; personal history of pancreatitis or gallbladder disease. - Recommend to check glucose once daily -START Farxiga  10mg  1 tablet once daily, provided 2 weeks of samples in office      Follow Up Plan: 2 weeks [name], PharmD Clinical Pharmacist [phone]

## 2024-04-20 ENCOUNTER — Ambulatory Visit (INDEPENDENT_AMBULATORY_CARE_PROVIDER_SITE_OTHER)

## 2024-04-20 VITALS — BP 128/76 | HR 83

## 2024-04-20 DIAGNOSIS — I10 Essential (primary) hypertension: Secondary | ICD-10-CM

## 2024-04-20 DIAGNOSIS — E1142 Type 2 diabetes mellitus with diabetic polyneuropathy: Secondary | ICD-10-CM

## 2024-04-20 DIAGNOSIS — N184 Chronic kidney disease, stage 4 (severe): Secondary | ICD-10-CM

## 2024-04-20 DIAGNOSIS — Z794 Long term (current) use of insulin: Secondary | ICD-10-CM

## 2024-04-20 NOTE — Progress Notes (Signed)
 01/11/2024 Name: [name] MRN: [id number] DOB: 11-29-1950  Chief Complaint  Patient presents with   Medication Management   Diabetes   Hypertension    [name] is a 73 y.o. year old male who was referred for medication management by their primary care provider, [name]. They presented for a face to face visit today.   They were referred to the pharmacist by their PCP for assistance in managing diabetes, hypertension, and complex medication management    Subjective:  Care Team: Primary Care Provider: [name], [name]  Medication Access/Adherence  [facility]  [phone] [place], [place] - [address] [place] [place] [address] Phone: [phone] Fax: [phone]  [facility] (New Address) - [place], [place] - [address] [facility] AT Previously: [name], [place] [address] [facility] [address] 2 4th Floor [address] [place] [address] Phone: [phone] Fax: [phone]  [facility] - [place]  - [place], [place] - [address] [place] [address] Phone: [phone] Fax: [phone]   Patient reports affordability concerns with their medications: No  Patient reports access/transportation concerns to their pharmacy: No  Patient reports adherence concerns with their medications:  YES - STOPS MEDS ON HIS OWN DUE TO NOT FEELING GOOD when taking them all  Hypertension:  Current medications:  Carvedilol  25mg  BID, Amlodipine  2.5mg  Medications previously tried: Diltiazem , Metoprolol , Losartan , Lisinopril , Triamterene /hydrochlorothiazide ,  Patient has a validated, automated, upper arm home BP cuff Current blood pressure readings: 90s/60s at home reviewed on his home bp monitor that he brings with him to appt  128/76 in office today.  Does complain of dizziness and admits to skipping doses of his carvedilol  if feeling  dizzy  Diabetes:  Current medications: Novolin  70/30 BID (admits to maybe missing some doses), Farxiga  10mg  Medications tried in the past: None (given farxiga  samples in past but did not take)  Current glucose readings: All readings mostly in the 300s, some in the mid-250s. States he has been checking after eating and not waiting 2 hours  Observed patterns:  Patient denies hypoglycemic s/sx including dizziness, shakiness, sweating. Patient denies hyperglycemic symptoms including polyuria, polydipsia, polyphagia, nocturia, neuropathy, blurred vision.   Objective:  Lab Results  Component Value Date   HGBA1C 9.4 (H) 03/23/2024    Lab Results  Component Value Date   CREATININE 2.25 (H) 03/23/2024   BUN 33 (H) 03/23/2024   NA 140 03/23/2024   K 4.4 03/23/2024   CL 102 03/23/2024   CO2 29 03/23/2024    Lab Results  Component Value Date   CHOL 153 03/23/2024   HDL 50.30 03/23/2024   LDLCALC 86 03/23/2024   LDLDIRECT 98 09/04/2022   TRIG 83.0 03/23/2024   CHOLHDL 3 03/23/2024    Medications Reviewed Today     Reviewed by [name], RPH (Pharmacist) on 04/20/24 at 1240  Med List Status: <None>   Medication Order Taking? Sig Documenting Provider Last Dose Status Informant  amLODipine  (NORVASC ) 2.5 MG tablet [id number] Yes Take 1 tablet (2.5 mg total) by mouth daily. [name]  Active   ASPIRIN  LOW DOSE 81 MG tablet [id number] Yes TAKE 1 TABLET (81 MG TOTAL) BY MOUTH DAILY. SWALLOW WHOLE. [name]  Active   atorvastatin  (LIPITOR) 80 MG tablet [id number] Yes TAKE 1 TABLET BY MOUTH EVERY DAY [name]  Active   blood glucose meter kit and supplies  [id number]  Dispense based on patient and insurance preference. Use to check glucose daily (FOR ICD-10 E10.9, E11.9). [name] Bring, DO  Active   Blood Glucose Monitoring Suppl (ONE TOUCH ULTRA 2) w/Device KIT [id number]  Use to check blood sugars twice daily as directed. Dx E11.9 [name] Clotilda SAUNDERS,  [name]  Active   carvedilol  (COREG ) 25 MG tablet [id number] Yes TAKE 1 TABLET BY MOUTH TWICE A DAY [name]  Active   dapagliflozin  propanediol (FARXIGA ) 10 MG TABS tablet [id number] Yes Take 10 mg by mouth daily. Provider, Historical, [name]  Active   glucose blood (ONETOUCH ULTRA TEST) test strip [id number]  Use to check blood sugar twice daily as directed E11.9 [name]  Active   insulin  isophane & regular human KwikPen (NOVOLIN  70/30 KWIKPEN) (70-30) 100 UNIT/ML KwikPen [id number] Yes Inject 20 Units into the skin daily before breakfast AND 24 Units at bedtime. [name]  Active   Insulin  Pen Needle (BD PEN NEEDLE NANO 2ND GEN) 32G X 4 MM MISC [id number]  To use with insulin  pen twice a day. [name]  Active   Lancet Devices (ONE TOUCH DELICA LANCING DEV) MISC [id number]  1 Device by Does not apply route as directed. [name], [name] Cardinal, [name]  Active   Lancets [facility] DELICA PLUS [place]) MISC [id number]  Use 1 each to test sugars twice daily as directed E11.9 [name]  Active   latanoprost (XALATAN) 0.005 % ophthalmic solution [id number]   Provider, Historical, [name]  Active               Assessment/Plan:    Hypertension: - Currently controlled - Reviewed long term cardiovascular and renal outcomes of uncontrolled blood pressure - Reviewed appropriate blood pressure monitoring technique and reviewed goal blood pressure. Recommended to check home blood pressure and heart rate daily -check twice daily and [name] readings to appt -HOLD Amlodipine  due to low BP/dizziness. Continue carvedilol , counseled on importance of adherence.  Diabetes: - Currently uncontrolled - Reviewed long term cardiovascular and renal outcomes of uncontrolled blood sugar - Reviewed goal A1c, goal fasting, and goal 2 hour post prandial glucose - Reviewed dietary modifications including low carb diet - Patient denies personal or family history of multiple  endocrine neoplasia type 2, medullary thyroid  cancer; personal history of pancreatitis or gallbladder disease. - Recommend to check glucose once daily -Continue farxiga  10mg  daily and insulin , counseled on how to correctly monitor BG. Declines CGM at this time due to cost. Application to [employer] submitted for PAP, pending response. More samples provided in office today.      Follow Up Plan: 2 weeks Jon VEAR Lindau, PharmD Clinical Pharmacist [phone]

## 2024-04-27 ENCOUNTER — Telehealth: Payer: Self-pay

## 2024-04-27 NOTE — Progress Notes (Signed)
   04/27/2024  Patient ID: [name], male   DOB: [birth year], 73 y.o.   MRN: [id number]  Received notification from [employer] that patient has been approved to receive Farxiga  10mg  through 10/26/24. First shipment pending.  Jon VEAR Lindau, PharmD Clinical Pharmacist [phone]

## 2024-05-04 ENCOUNTER — Ambulatory Visit

## 2024-05-04 DIAGNOSIS — I10 Essential (primary) hypertension: Secondary | ICD-10-CM

## 2024-05-04 DIAGNOSIS — Z794 Long term (current) use of insulin: Secondary | ICD-10-CM | POA: Diagnosis not present

## 2024-05-04 DIAGNOSIS — E1142 Type 2 diabetes mellitus with diabetic polyneuropathy: Secondary | ICD-10-CM

## 2024-05-04 DIAGNOSIS — N184 Chronic kidney disease, stage 4 (severe): Secondary | ICD-10-CM

## 2024-05-04 MED ORDER — DEXCOM G7 SENSOR MISC: 2 refills | Status: DC

## 2024-05-04 NOTE — Progress Notes (Signed)
 01/11/2024 Name: [name] MRN: [id number] DOB: 01/08/1951  Chief Complaint  Patient presents with   Medication Management   Diabetes   Hypertension    [name] is a 73 y.o. year old male who was referred for medication management by their primary care provider, [name], MD. They presented for a face to face visit today.   They were referred to the pharmacist by their PCP for assistance in managing diabetes, hypertension, and complex medication management    Subjective:  Care Team: Primary Care Provider: [name], MD  Medication Access/Adherence  [facility]  [phone] [place], [place] - [address] [facility] [address] [place] [address] Phone: [phone] Fax: [phone]  [facility] (New Address) - [place], [place] - [address] [facility] AT Previously: Viviana [place], [place] [address] [facility] [address] 2 4th Floor [address] [place] [address] Phone: [phone] Fax: [phone]  [facility] - [place]  - [place], [place] - [address] [place] [address] Phone: [phone] Fax: [phone]   Patient reports affordability concerns with their medications: No  Patient reports access/transportation concerns to their pharmacy: No  Patient reports adherence concerns with their medications:  YES - STOPS MEDS ON HIS OWN DUE TO NOT FEELING GOOD when taking them all  Hypertension:  Current medications:  Carvedilol  25mg  BID, Amlodipine  2.5mg  (being held at this time) Medications previously tried: Diltiazem , Metoprolol , Losartan , Lisinopril , Triamterene /hydrochlorothiazide ,  Patient has a validated, automated, upper arm home BP cuff Current blood pressure readings: <!40/90 at home reviewed on his home bp monitor that he brings with him to appt  159/91 in office today but comes down to 145/81 upon recheck  Diabetes:  Current  medications: Novolin  70/30 BID (admits to maybe missing some doses), Farxiga  10mg  Medications tried in the past: None   Current glucose readings: All readings mostly in the 300s, some in the mid-250s. States he has been checking after eating and not waiting 2 hours  Observed patterns:  Patient denies hypoglycemic s/sx including dizziness, shakiness, sweating. Patient denies hyperglycemic symptoms including polyuria, polydipsia, polyphagia, nocturia, neuropathy, blurred vision.   Objective:  Lab Results  Component Value Date   HGBA1C 9.4 (H) 03/23/2024    Lab Results  Component Value Date   CREATININE 2.25 (H) 03/23/2024   BUN 33 (H) 03/23/2024   NA 140 03/23/2024   K 4.4 03/23/2024   CL 102 03/23/2024   CO2 29 03/23/2024    Lab Results  Component Value Date   CHOL 153 03/23/2024   HDL 50.30 03/23/2024   LDLCALC 86 03/23/2024   LDLDIRECT 98 09/04/2022   TRIG 83.0 03/23/2024   CHOLHDL 3 03/23/2024    Medications Reviewed Today     Reviewed by Lionell [name] DEL, RPH (Pharmacist) on 05/04/24 at 1343  Med List Status: <None>   Medication Order Taking? Sig Documenting Provider Last Dose Status Informant  amLODipine  (NORVASC ) 2.5 MG tablet [id number]  Take 1 tablet (2.5 mg total) by mouth daily.  Patient not taking: Reported on 05/04/2024   [name], MD  Active   ASPIRIN  LOW DOSE 81 MG tablet [id number] Yes TAKE 1 TABLET (81 MG TOTAL) BY MOUTH DAILY. SWALLOW WHOLE. [name], MD  Active   atorvastatin  (LIPITOR) 80 MG tablet [id number] Yes TAKE 1 TABLET BY MOUTH EVERY DAY [name], MD  Active   blood glucose meter kit and supplies [id number]  Dispense  based on patient and insurance preference. Use to check glucose daily (FOR ICD-10 E10.9, E11.9). Alvia Bring, DO  Active   Blood Glucose Monitoring Suppl (ONE TOUCH ULTRA 2) w/Device KIT [id number]  Use to check blood sugars twice daily as directed. Dx E11.9 [name], MD  Active   carvedilol  (COREG ) 25  MG tablet [id number] Yes TAKE 1 TABLET BY MOUTH TWICE A DAY [name], MD  Active   dapagliflozin  propanediol (FARXIGA ) 10 MG TABS tablet [id number] Yes Take 10 mg by mouth daily. [provider]  Active   glucose blood (ONETOUCH ULTRA TEST) test strip [id number]  Use to check blood sugar twice daily as directed E11.9 [name], MD  Active   insulin  isophane & regular human KwikPen (NOVOLIN  70/30 KWIKPEN) (70-30) 100 UNIT/ML KwikPen [id number] Yes Inject 20 Units into the skin daily before breakfast AND 24 Units at bedtime. [name], MD  Active   Insulin  Pen Needle (BD PEN NEEDLE NANO 2ND GEN) 32G X 4 MM MISC [id number]  To use with insulin  pen twice a day. [name], MD  Active   Lancet Devices (ONE TOUCH DELICA LANCING DEV) MISC [id number]  1 Device by Does not apply route as directed. Shamleffer, Donell Cardinal, MD  Active   Lancets [facility] DELICA PLUS [place]) MISC [id number]  Use 1 each to test sugars twice daily as directed E11.9 [name], MD  Active   latanoprost (XALATAN) 0.005 % ophthalmic solution [id number]   [provider]  Active               Assessment/Plan:    Hypertension: - Currently elevated in office today but controlled readings at home, continue to monitor - Reviewed long term cardiovascular and renal outcomes of uncontrolled blood pressure - Reviewed appropriate blood pressure monitoring technique and reviewed goal blood pressure. Recommended to check home blood pressure and heart rate daily -check twice daily and bring readings to appt -HOLD Amlodipine  due to low BP/dizziness. Continue carvedilol , counseled on importance of adherence.  -Has follow up with nephrology on 7/17, they may make changes  Diabetes: - Currently uncontrolled - Reviewed long term cardiovascular and renal outcomes of uncontrolled blood sugar - Reviewed goal A1c, goal fasting, and goal 2 hour post prandial glucose - Reviewed dietary  modifications including low carb diet - Patient denies personal or family history of multiple endocrine neoplasia type 2, medullary thyroid  cancer; personal history of pancreatitis or gallbladder disease. - Recommend to check glucose once daily -Continue farxiga  10mg  daily and insulin , counseled on how to correctly monitor BG.  -Sending in rx for dexcom sensors per standing protocol, patient will let me know if he is able to pickup or if it is too expensive. -Consider addition of ozempic at next visit if sugars remain elevated      Follow Up Plan: 2 weeks [name], PharmD Clinical Pharmacist [phone]

## 2024-05-12 DIAGNOSIS — N189 Chronic kidney disease, unspecified: Secondary | ICD-10-CM | POA: Diagnosis not present

## 2024-05-12 DIAGNOSIS — N184 Chronic kidney disease, stage 4 (severe): Secondary | ICD-10-CM | POA: Diagnosis not present

## 2024-05-12 DIAGNOSIS — R351 Nocturia: Secondary | ICD-10-CM | POA: Diagnosis not present

## 2024-05-12 DIAGNOSIS — I129 Hypertensive chronic kidney disease with stage 1 through stage 4 chronic kidney disease, or unspecified chronic kidney disease: Secondary | ICD-10-CM | POA: Diagnosis not present

## 2024-05-12 DIAGNOSIS — R7989 Other specified abnormal findings of blood chemistry: Secondary | ICD-10-CM | POA: Diagnosis not present

## 2024-05-12 DIAGNOSIS — N2581 Secondary hyperparathyroidism of renal origin: Secondary | ICD-10-CM | POA: Diagnosis not present

## 2024-05-12 DIAGNOSIS — N401 Enlarged prostate with lower urinary tract symptoms: Secondary | ICD-10-CM | POA: Diagnosis not present

## 2024-05-12 DIAGNOSIS — D631 Anemia in chronic kidney disease: Secondary | ICD-10-CM | POA: Diagnosis not present

## 2024-05-18 ENCOUNTER — Ambulatory Visit

## 2024-05-18 VITALS — BP 124/70 | HR 74

## 2024-05-18 DIAGNOSIS — E1142 Type 2 diabetes mellitus with diabetic polyneuropathy: Secondary | ICD-10-CM

## 2024-05-18 DIAGNOSIS — N184 Chronic kidney disease, stage 4 (severe): Secondary | ICD-10-CM | POA: Diagnosis not present

## 2024-05-18 DIAGNOSIS — I10 Essential (primary) hypertension: Secondary | ICD-10-CM

## 2024-05-18 DIAGNOSIS — Z794 Long term (current) use of insulin: Secondary | ICD-10-CM

## 2024-05-18 NOTE — Progress Notes (Signed)
 01/11/2024 Name: [name] MRN: [id number] DOB: 11-28-1950  Chief Complaint  Patient presents with   Medication Management   Hypertension   Diabetes    [name] is a 73 y.o. year old male who was referred for medication management by their primary care provider, [name] Clotilda SAUNDERS, MD. They presented for a face to face visit today.   They were referred to the pharmacist by their PCP for assistance in managing diabetes, hypertension, and complex medication management    Subjective:  Care Team: Primary Care Provider: [name], MD  Medication Access/Adherence  [facility]  [phone] [place], [place] - [address] [place] [address] Phone: [phone] Fax: [phone]  [facility] (New Address) - [place], [place] - [address] [facility] AT Previously: Viviana [place], [place] [address] [facility] [address] 2 4th Floor [address] [place] [address] Phone: [phone] Fax: [phone]  [facility] - [place]  - [place] - [address] [place] [address] Phone: [phone] Fax: [phone]   Patient reports affordability concerns with their medications: No  Patient reports access/transportation concerns to their pharmacy: No  Patient reports adherence concerns with their medications:  YES - STOPS MEDS ON HIS OWN DUE TO NOT FEELING GOOD when taking them all  Hypertension:  Current medications:  Carvedilol  25mg  BID Medications previously tried: Diltiazem , Metoprolol , Losartan , Lisinopril , Triamterene /hydrochlorothiazide ,  Patient has a validated, automated, upper arm home BP cuff Current blood pressure readings: 124/70  Reports he still has some dizziness with movement at times, asked about meclizine otc   Diabetes:  Current medications: Novolin  70/30 BID (admits to maybe missing some doses), Farxiga   10mg  Medications tried in the past: None   Current glucose readings: Not able to report any at this time. Presents today with new dexcom sensor for application and review  Observed patterns:  Patient denies hypoglycemic s/sx including dizziness, shakiness, sweating. Patient denies hyperglycemic symptoms including polyuria, polydipsia, polyphagia, nocturia, neuropathy, blurred vision.   Objective:  Lab Results  Component Value Date   HGBA1C 9.4 (H) 03/23/2024    Lab Results  Component Value Date   CREATININE 2.25 (H) 03/23/2024   BUN 33 (H) 03/23/2024   NA 140 03/23/2024   K 4.4 03/23/2024   CL 102 03/23/2024   CO2 29 03/23/2024    Lab Results  Component Value Date   CHOL 153 03/23/2024   HDL 50.30 03/23/2024   LDLCALC 86 03/23/2024   LDLDIRECT 98 09/04/2022   TRIG 83.0 03/23/2024   CHOLHDL 3 03/23/2024    Medications Reviewed Today     Reviewed by Lionell Jon DEL, RPH (Pharmacist) on 05/18/24 at 1351  Med List Status: <None>   Medication Order Taking? Sig Documenting Provider Last Dose Status Informant   Patient not taking:   Discontinued 05/18/24 1349 (Change in therapy)   ASPIRIN  LOW DOSE 81 MG tablet [id number] Yes TAKE 1 TABLET (81 MG TOTAL) BY MOUTH DAILY. SWALLOW WHOLE. [name] Clotilda SAUNDERS, MD  Active   atorvastatin  (LIPITOR) 80 MG tablet [id number] Yes TAKE 1 TABLET BY MOUTH EVERY DAY [name] Clotilda SAUNDERS, MD  Active   blood glucose meter kit and supplies [id number]  Dispense based on patient and insurance preference. Use to check glucose daily (FOR ICD-10 E10.9, E11.9). Alvia Bring, DO  Active   Blood Glucose Monitoring Suppl (ONE TOUCH ULTRA 2) w/Device KIT [id number]  Use to check blood sugars twice daily  as directed. Dx E11.9 [name] Clotilda SAUNDERS, MD  Active   carvedilol  (COREG ) 25 MG tablet [id number] Yes TAKE 1 TABLET BY MOUTH TWICE A DAY [name] Clotilda SAUNDERS, MD  Active   cholecalciferol (VITAMIN D3) 25 MCG (1000 UNIT) tablet [id number] Yes Take 1,000 Units by mouth  daily. [provider]  Active   Continuous Glucose Sensor (DEXCOM G7 SENSOR) MISC [id number]  Use to monitor blood sugar continuously. Change sensor every 10 days. E11.65 [name] Clotilda SAUNDERS, MD  Active   dapagliflozin  propanediol (FARXIGA ) 10 MG TABS tablet [id number] Yes Take 10 mg by mouth daily. [provider]  Active   Ferrous Sulfate Dried (FERROUS SULFATE CR PO) [id number] Yes Take 325 mg by mouth daily at 2 PM. [provider]  Active   glucose blood (ONETOUCH ULTRA TEST) test strip [id number]  Use to check blood sugar twice daily as directed E11.9 [name] Clotilda SAUNDERS, MD  Active   insulin  isophane & regular human KwikPen (NOVOLIN  70/30 KWIKPEN) (70-30) 100 UNIT/ML KwikPen [id number] Yes Inject 20 Units into the skin daily before breakfast AND 24 Units at bedtime. [name] Clotilda SAUNDERS, MD  Active   Insulin  Pen Needle (BD PEN NEEDLE NANO 2ND GEN) 32G X 4 MM MISC [id number]  To use with insulin  pen twice a day. [name] Clotilda SAUNDERS, MD  Active   Lancet Devices (ONE TOUCH DELICA LANCING DEV) MISC [id number]  1 Device by Does not apply route as directed. Shamleffer, Donell Cardinal, MD  Active   Lancets [facility] DELICA PLUS [place]) MISC [id number]  Use 1 each to test sugars twice daily as directed E11.9 [name] Clotilda SAUNDERS, MD  Active   latanoprost (XALATAN) 0.005 % ophthalmic solution [id number]   [provider]  Active               Assessment/Plan:    Hypertension: - Currently controlled - Reviewed long term cardiovascular and renal outcomes of uncontrolled blood pressure - Reviewed appropriate blood pressure monitoring technique and reviewed goal blood pressure. Recommended to check home blood pressure and heart rate daily -check twice daily and bring readings to appt -Continue current medication therapy  Diabetes: - Currently uncontrolled - Reviewed long term cardiovascular and renal outcomes of uncontrolled blood sugar - Reviewed goal A1c, goal fasting, and  goal 2 hour post prandial glucose - Reviewed dietary modifications including low carb diet - Patient denies personal or family history of multiple endocrine neoplasia type 2, medullary thyroid  cancer; personal history of pancreatitis or gallbladder disease. - Recommend to check glucose once daily -Continue farxiga  10mg  daily and insulin , counseled on how to correctly monitor BG.  -Call office with any BG concerns prior to scheduled follow up -Consider addition of ozempic at next visit if sugars remain elevated      Follow Up Plan: 1 month Jon VEAR Lindau, PharmD Clinical Pharmacist [phone]

## 2024-05-21 ENCOUNTER — Other Ambulatory Visit: Payer: Self-pay | Admitting: Family Medicine

## 2024-06-03 ENCOUNTER — Other Ambulatory Visit: Payer: Self-pay | Admitting: Family Medicine

## 2024-06-15 ENCOUNTER — Ambulatory Visit

## 2024-06-15 DIAGNOSIS — E1142 Type 2 diabetes mellitus with diabetic polyneuropathy: Secondary | ICD-10-CM | POA: Diagnosis not present

## 2024-06-15 DIAGNOSIS — N184 Chronic kidney disease, stage 4 (severe): Secondary | ICD-10-CM | POA: Diagnosis not present

## 2024-06-15 DIAGNOSIS — I10 Essential (primary) hypertension: Secondary | ICD-10-CM | POA: Diagnosis not present

## 2024-06-15 DIAGNOSIS — Z794 Long term (current) use of insulin: Secondary | ICD-10-CM | POA: Diagnosis not present

## 2024-06-15 DIAGNOSIS — E785 Hyperlipidemia, unspecified: Secondary | ICD-10-CM

## 2024-06-15 DIAGNOSIS — E1169 Type 2 diabetes mellitus with other specified complication: Secondary | ICD-10-CM

## 2024-06-15 NOTE — Progress Notes (Signed)
 01/11/2024 Name: [name] MRN: [id number] DOB: 07-13-1951  Chief Complaint  Patient presents with   Medication Management   Diabetes   Hypertension   Hyperlipidemia    [name] is a 73 y.o. year old male who was referred for medication management by their primary care provider, [name], MD. They presented for a face to face visit today.   They were referred to the pharmacist by their PCP for assistance in managing diabetes, hypertension, and complex medication management    Subjective:  Care Team: Primary Care Provider: [name], MD  Medication Access/Adherence  [phone] [place], [place] - [address] [facility] [address] [place] [address] Phone: [phone] Fax: [phone]  [facility] (New Address) - [place], [place] - [address] [facility] [facility] AT Previously: Viviana Mulligan, [place] [address] [facility] Building 2 4th Floor [address] [place] [address] Phone: [phone] Fax: [phone]  [facility] [place]  - [place], [place] - [address] [place] [address] Phone: [phone] Fax: [phone]   Patient reports affordability concerns with their medications: No  Patient reports access/transportation concerns to their pharmacy: No  Patient reports adherence concerns with their medications:  YES - STOPS MEDS ON HIS OWN DUE TO NOT FEELING GOOD when taking them all  Hypertension:  Current medications:  Carvedilol  25mg  BID Medications previously tried: Diltiazem , Metoprolol , Losartan , Lisinopril , Triamterene /hydrochlorothiazide ,  Patient has a validated, automated, upper arm home BP cuff Current blood pressure readings: 158/92 in office today, comes down to 146/88 at recheck  Reports he had one episode of dizziness in last 3 weeks, resolved with meclizine use, reports he thinks it is vertigo and states he has a  history of this   Diabetes:  Current medications: Novolin  70/30 BID 21 units in morning, 22 at night, Farxiga  10mg  Medications tried in the past: None   Current glucose readings:  Dexcom fell off after one day, patient did not apply replacement. Does bring replacement in office today. Reports checking BG at home with finger prick, did not bring log but reports all readings have been over 200, even fasting  Observed patterns:  Patient denies hypoglycemic s/sx including dizziness, shakiness, sweating. Patient denies hyperglycemic symptoms including polyuria, polydipsia, polyphagia, nocturia, neuropathy, blurred vision.   Objective:  Lab Results  Component Value Date   HGBA1C 9.4 (H) 03/23/2024    Lab Results  Component Value Date   CREATININE 2.25 (H) 03/23/2024   BUN 33 (H) 03/23/2024   NA 140 03/23/2024   K 4.4 03/23/2024   CL 102 03/23/2024   CO2 29 03/23/2024    Lab Results  Component Value Date   CHOL 153 03/23/2024   HDL 50.30 03/23/2024   LDLCALC 86 03/23/2024   LDLDIRECT 98 09/04/2022   TRIG 83.0 03/23/2024   CHOLHDL 3 03/23/2024    Medications Reviewed Today     Reviewed by Lionell Jon DEL, RPH (Pharmacist) on 06/15/24 at 1350  Med List Status: <None>   Medication Order Taking? Sig Documenting Provider Last Dose Status Informant  ASPIRIN  LOW DOSE 81 MG tablet [id number] Yes TAKE 1 TABLET (81 MG TOTAL) BY MOUTH DAILY. SWALLOW WHOLE. [name], MD  Active   atorvastatin  (LIPITOR) 80 MG tablet [id number] Yes TAKE 1 TABLET BY MOUTH EVERY DAY [name], MD  Active   blood glucose meter kit and supplies [id number]  Dispense based on patient and insurance  preference. Use to check glucose daily (FOR ICD-10 E10.9, E11.9). Alvia Bring, DO  Active   Blood Glucose Monitoring Suppl (ONE TOUCH ULTRA 2) w/Device KIT [id number]  Use to check blood sugars twice daily as directed. Dx E11.9 [name], MD  Active   carvedilol  (COREG ) 25 MG tablet  [id number] Yes TAKE 1 TABLET BY MOUTH TWICE A DAY [name], MD  Active   cholecalciferol (VITAMIN D3) 25 MCG (1000 UNIT) tablet [id number] Yes Take 1,000 Units by mouth daily. [provider]  Active   Continuous Glucose Sensor (DEXCOM G7 SENSOR) MISC [id number] Yes Use to monitor blood sugar continuously. Change sensor every 10 days. E11.65 [name], MD  Active   dapagliflozin  propanediol (FARXIGA ) 10 MG TABS tablet [id number] Yes Take 10 mg by mouth daily. [provider]  Active   Ferrous Sulfate Dried (FERROUS SULFATE CR PO) [id number] Yes Take 325 mg by mouth daily at 2 PM. [provider]  Active   glucose blood (ONETOUCH ULTRA TEST) test strip [id number]  Use to check blood sugar twice daily as directed E11.9 [name], MD  Active   insulin  isophane & regular human KwikPen (NOVOLIN  70/30 KWIKPEN) (70-30) 100 UNIT/ML KwikPen [id number] Yes Inject 20 Units into the skin daily before breakfast AND 24 Units at bedtime. [name], MD  Active   Insulin  Pen Needle (BD PEN NEEDLE NANO 2ND GEN) 32G X 4 MM MISC [id number]  To use with insulin  pen twice a day. [name], MD  Active   Lancet Devices (ONE TOUCH DELICA LANCING DEV) MISC [id number]  1 Device by Does not apply route as directed. Shamleffer, Donell Cardinal, MD  Active   Lancets [facility] [place] PLUS [place]) MISC [id number]  Use 1 each to test sugars twice daily as directed E11.9 [name], MD  Active   latanoprost (XALATAN) 0.005 % ophthalmic solution [id number] Yes  [provider]  Active   Semaglutide,0.25 or 0.5MG /DOS, (OZEMPIC, 0.25 OR 0.5 MG/DOSE,) 2 MG/3ML SOPN [id number] Yes Inject 0.25 mg into the skin once a week. PAP pending, sample box provided in office [provider]  Active               Assessment/Plan:    Hypertension: - Currently controlled per patient report at home but elevated in office today - Reviewed long term cardiovascular and  renal outcomes of uncontrolled blood pressure - Reviewed appropriate blood pressure monitoring technique and reviewed goal blood pressure. Recommended to check home blood pressure and heart rate daily -check twice daily and bring readings to appt -Continue current medication therapy for now. If BP remains elevated, consider retrial of low dose amlodipine  or hydrochlorothiazide  as patient did not tolerate ACEI/ARB in past  Diabetes: - Currently uncontrolled - Reviewed long term cardiovascular and renal outcomes of uncontrolled blood sugar - Reviewed goal A1c, goal fasting, and goal 2 hour post prandial glucose - Reviewed dietary modifications including low carb diet - Patient denies personal or family history of multiple endocrine neoplasia type 2, medullary thyroid  cancer; personal history of pancreatitis or gallbladder disease. - Recommend to check glucose once daily -START Ozempic 0.25mg , Continue farxiga  10mg  daily and insulin , counseled on how to correctly monitor BG.  -Call office with any BG concerns prior to scheduled follow up       Follow Up Plan: 2 weeks Jon VEAR Lindau, PharmD Clinical Pharmacist [phone]

## 2024-06-16 ENCOUNTER — Telehealth: Payer: Self-pay

## 2024-06-16 NOTE — Progress Notes (Signed)
   06/16/2024  Patient ID: [name], male   DOB: 30-Mar-1951, 73 y.o.   MRN: [id number]  Patient called in with a question regarding Ozempic injection, wondering if he needed to be worried about losing weight. Counseled that shot can cause weight loss but that we can monitor. If weight loss becomes a concern, can stop the injection.  Patient expressed understanding, plans to give first injection today.  Jon VEAR Lindau, PharmD Clinical Pharmacist [phone]

## 2024-06-20 ENCOUNTER — Telehealth: Payer: Self-pay

## 2024-06-20 NOTE — Progress Notes (Signed)
   06/20/2024  Patient ID: [name], male   DOB: [birth year], 73 y.o.   MRN: [id number]  Faxed in patient's completed application for Ozempic 0.5mg , including the provider portion, to [employer]. Routing to Med Assistance team for follow up.  Jon VEAR Lindau, PharmD Clinical Pharmacist [phone]

## 2024-06-22 ENCOUNTER — Telehealth: Payer: Self-pay

## 2024-06-22 NOTE — Progress Notes (Signed)
   06/22/2024  Patient ID: Verna All, male   DOB: 12-24-1950, 73 y.o.   MRN: [id number]  Received notification that patient has been approved to receive Ozempic 0.5mg  through 10/26/24 through [employer].  Follow Up Scheduled for 06/29/24  Jon VEAR Lindau, PharmD Clinical Pharmacist [phone]

## 2024-06-24 ENCOUNTER — Telehealth: Payer: Self-pay

## 2024-06-24 NOTE — Telephone Encounter (Signed)
 Called pt to advised that pt assistance medication is ready for pick up but no answer.

## 2024-06-28 ENCOUNTER — Telehealth: Payer: Self-pay | Admitting: Pharmacy Technician

## 2024-06-28 NOTE — Progress Notes (Signed)
   06/28/2024  Patient ID: [name], male   DOB: 08/07/1951, 73 y.o.   MRN: [id number]  Patient engaged with clinical pharmacist for management of diabetes on 06/15/2024. Outreach by [employer] technician was requested.   Outreached patient to discuss diabetes medication management. Voicemail box is full so no voicemail was able to be left on mobile number. Attempted hom number and it states it is disconnected.  Kerra Guilfoil, CPhT [facility] Office: [phone] Email: [email or web address]

## 2024-06-29 ENCOUNTER — Ambulatory Visit

## 2024-06-29 ENCOUNTER — Telehealth: Payer: Self-pay

## 2024-06-29 NOTE — Progress Notes (Signed)
   06/29/2024  Patient ID: [name], male   DOB: 01-04-1951, 73 y.o.   MRN: [id number]  Attempted to contact patient for scheduled appointment x 3 for medication management. Unable to leave voicemail as mailbox is full.  Jon VEAR Lindau, PharmD Clinical Pharmacist [phone]

## 2024-06-30 ENCOUNTER — Telehealth: Payer: Self-pay

## 2024-06-30 ENCOUNTER — Telehealth: Payer: Self-pay | Admitting: Pharmacy Technician

## 2024-06-30 NOTE — Progress Notes (Signed)
   06/30/2024  Patient ID: Verna All, male   DOB: Dec 05, 1950, 73 y.o.   MRN: [id number]  Attempted to contact patient to reschedule missed appt for medication management. Unable to leave message as mailbox full.  Jon VEAR Lindau, PharmD Clinical Pharmacist [phone]

## 2024-06-30 NOTE — Progress Notes (Signed)
   06/30/2024 Name: [name] MRN: [id number] DOB: 1951/10/04  Patient is appearing on a report for [employer] [place] Metric Diabetes and last engaged with the clinical pharmacist to discuss diabetes on 06/15/2024. Contacted patient today to discuss diabetes management and completed medication review.   Diabetes Plan from last clinical pharmacist appointment:  Diabetes: - Currently uncontrolled - Reviewed long term cardiovascular and renal outcomes of uncontrolled blood sugar - Reviewed goal A1c, goal fasting, and goal 2 hour post prandial glucose - Reviewed dietary modifications including low carb diet - Patient denies personal or family history of multiple endocrine neoplasia type 2, medullary thyroid  cancer; personal history of pancreatitis or gallbladder disease. - Recommend to check glucose once daily -START Ozempic 0.25mg , Continue farxiga  10mg  daily and insulin , counseled on how to correctly monitor BG.  -Call office with any BG concerns prior to scheduled follow u(copy/paste from last note)   Medication Adherence Barriers Identified:  Patient made recommended medication changes per plan: No Patient has not started Ozempic. He is not sure he wants to start it. He informs he has heard it causes weight loss and he informs he has not weight to lose. He also informs he heard it can cause you to be sick and I can't be sick, I have bills to pay. Patient's PAP medication has arrived at office per 06/24/24 note. Patient informs he does take Farixga 10mg  daily and he just received a shipment of that yesterday. He informs he does take his insulin  Novolin  70/30. He informs he takes 21 units in the morning and 22 in the evening. Access issues with any new medication or testing device: No Patient gets Farxiga  and Ozempic from PAP. Per Dr Annemarie, Novolin  70/30 was last filled in April. Patient informs he has this on hand and is using it despite Dr Annemarie data. Patient is checking blood sugars as prescribed:  Yes Patient informs he is checking blood sugars. Per Dr Annemarie, [name] sensors last filled on 05/07/24 for 30 day supply. He informs blood sugars wills tart out at 122,121 and then go to 157. Patient reports Carvedilol  is making him dizzy and lightheaded. He informs he even tried to do 1/2 tablet  per dose for about 2 weeks and still had the same dizzy and lightheadedness effects. He does not want to take it due to these feelings.He reports home blood pressures of 112-130 over 79-80. Patient was informed he is due for PCP appointment as Dr Mercer wanted to see him in about 3 omnths (last appointment May 28). Advised [facility] to call and make apointment with PCP.  Medication Adherence Barriers Addressed/Actions Taken:  Reviewed medication changes per plan from last clinical pharmacist note Educated patient to contact pharmacy regarding new prescriptions/refills  Patient Assistance in Progress for Farixga and Ozempic Patient Assistance Program approved  Reviewed instructions for monitoring blood sugars at home and reminded patient to keep a written log to review with pharmacist Reminded patient of date/time of upcoming clinical pharmacist follow up and any upcoming PCP/specialists visits. Patient denies transportation barriers to the appointment. Yes  Next clinical pharmacist appointment is scheduled for: TBD  Kate Caddy, CPhT [facility] Office: [phone] Email: [name]

## 2024-07-06 ENCOUNTER — Ambulatory Visit (INDEPENDENT_AMBULATORY_CARE_PROVIDER_SITE_OTHER)

## 2024-07-06 DIAGNOSIS — N184 Chronic kidney disease, stage 4 (severe): Secondary | ICD-10-CM

## 2024-07-06 DIAGNOSIS — Z794 Long term (current) use of insulin: Secondary | ICD-10-CM | POA: Diagnosis not present

## 2024-07-06 DIAGNOSIS — E1142 Type 2 diabetes mellitus with diabetic polyneuropathy: Secondary | ICD-10-CM

## 2024-07-06 DIAGNOSIS — I10 Essential (primary) hypertension: Secondary | ICD-10-CM

## 2024-07-06 NOTE — Progress Notes (Signed)
 01/11/2024 Name: [name] MRN: [id number] DOB: October 19, 1951  Chief Complaint  Patient presents with   Medication Management   Diabetes   Hypertension    [name] is a 73 y.o. year old male who was referred for medication management by their primary care provider, [name] Clotilda SAUNDERS, MD. They presented for a face to face visit today.   They were referred to the pharmacist by their PCP for assistance in managing diabetes, hypertension, and complex medication management    Subjective:  Care Team: Primary Care Provider: [name], MD  Medication Access/Adherence  [phone] [place], [place] - [address] [place] [address] Phone: [phone] Fax: [phone]  [facility] (New Address) - [place], [place] - [address] [facility] AT Previously: Viviana Mulligan, [place] [address] [facility] [address] 2 4th Floor [address] [place] [address] Phone: [phone] Fax: [phone]  [facility] - [place]  - [place], [place] - [address] [place] [address] Phone: [phone] Fax: [phone]   Patient reports affordability concerns with their medications: No  Patient reports access/transportation concerns to their pharmacy: No  Patient reports adherence concerns with their medications:  YES - STOPS MEDS ON HIS OWN DUE TO NOT FEELING GOOD when taking them all  Hypertension:  Current medications:  Carvedilol  25mg  BID - NOT BEEN TAKING Due to Feeling dizzy Medications previously tried: Diltiazem , Metoprolol , Losartan , Lisinopril , Triamterene /hydrochlorothiazide ,  Patient has a validated, automated, upper arm home BP cuff Current blood pressure readings: 197/96 HR 94, 179/102 HR 82 at recheck    Diabetes:  Current medications: Novolin  70/30 BID 21 units in morning, 22 at night, Farxiga  10mg  Medications tried in the  past: None   Current glucose readings:  Not wearing sensor since it wore, never reapplied because it kept going off which annoyed him  Looking back at 10 day report available, sugars avg 241 and staying high  Observed patterns:  Patient denies hypoglycemic s/sx including dizziness, shakiness, sweating. Patient denies hyperglycemic symptoms including polyuria, polydipsia, polyphagia, nocturia, neuropathy, blurred vision.   Objective:  Lab Results  Component Value Date   HGBA1C 9.4 (H) 03/23/2024    Lab Results  Component Value Date   CREATININE 2.25 (H) 03/23/2024   BUN 33 (H) 03/23/2024   NA 140 03/23/2024   K 4.4 03/23/2024   CL 102 03/23/2024   CO2 29 03/23/2024    Lab Results  Component Value Date   CHOL 153 03/23/2024   HDL 50.30 03/23/2024   LDLCALC 86 03/23/2024   LDLDIRECT 98 09/04/2022   TRIG 83.0 03/23/2024   CHOLHDL 3 03/23/2024    Medications Reviewed Today     Reviewed by [name], RPH (Pharmacist) on 07/06/24 at 1340  Med List Status: <None>   Medication Order Taking? Sig Documenting Provider Last Dose Status Informant  ASPIRIN  LOW DOSE 81 MG tablet [id number] Yes TAKE 1 TABLET (81 MG TOTAL) BY MOUTH DAILY. SWALLOW WHOLE. [name] Clotilda SAUNDERS, MD  Active   atorvastatin  (LIPITOR) 80 MG tablet [id number] Yes TAKE 1 TABLET BY MOUTH EVERY DAY [name] Clotilda SAUNDERS, MD  Active   blood glucose meter kit and supplies [id number]  Dispense based on patient and insurance preference. Use to check glucose daily (FOR ICD-10 E10.9, E11.9). [name] Bring, DO  Active   Blood Glucose Monitoring Suppl (ONE TOUCH ULTRA 2) w/Device KIT [id number]  Use to check blood sugars twice  daily as directed. Dx E11.9 [name] Clotilda SAUNDERS, MD  Active   carvedilol  (COREG ) 25 MG tablet [id number] Yes TAKE 1 TABLET BY MOUTH TWICE A DAY  Patient taking differently: Take 25 mg by mouth 2 (two) times daily. 1/2 tab twice daily   [name] Clotilda SAUNDERS, MD  Active   cholecalciferol (VITAMIN D3) 25 MCG  (1000 UNIT) tablet [id number] Yes Take 1,000 Units by mouth daily. [provider]  Active   Continuous Glucose Sensor (DEXCOM G7 SENSOR) MISC [id number] Yes Use to monitor blood sugar continuously. Change sensor every 10 days. E11.65 [name] Clotilda SAUNDERS, MD  Active   dapagliflozin  propanediol (FARXIGA ) 10 MG TABS tablet [id number] Yes Take 10 mg by mouth daily. [provider]  Active   Ferrous Sulfate Dried (FERROUS SULFATE CR PO) [id number] Yes Take 325 mg by mouth daily at 2 PM. [provider]  Active   glucose blood (ONETOUCH ULTRA TEST) test strip [id number]  Use to check blood sugar twice daily as directed E11.9 [name] Clotilda SAUNDERS, MD  Active   insulin  isophane & regular human KwikPen (NOVOLIN  70/30 KWIKPEN) (70-30) 100 UNIT/ML KwikPen [id number] Yes Inject 20 Units into the skin daily before breakfast AND 24 Units at bedtime. [name] Clotilda SAUNDERS, MD  Active   Insulin  Pen Needle (BD PEN NEEDLE NANO 2ND GEN) 32G X 4 MM MISC [id number]  To use with insulin  pen twice a day. [name] Clotilda SAUNDERS, MD  Active   Lancet Devices (ONE TOUCH DELICA LANCING DEV) MISC [id number]  1 Device by Does not apply route as directed. [name], [name] Cardinal, MD  Active   Lancets [name] PLUS [place]) MISC [id number]  Use 1 each to test sugars twice daily as directed E11.9 [name] Clotilda SAUNDERS, MD  Active   latanoprost (XALATAN) 0.005 % ophthalmic solution [id number]   [provider]  Active   Semaglutide,0.25 or 0.5MG /DOS, (OZEMPIC, 0.25 OR 0.5 MG/DOSE,) 2 MG/3ML SOPN [id number] Yes Inject 0.25 mg into the skin once a week. PAP pending, sample box provided in office [provider]  Active               Assessment/Plan:    Hypertension: - Currently controlled per patient report at home but elevated in office today - Reviewed long term cardiovascular and renal outcomes of uncontrolled blood pressure - Reviewed appropriate blood pressure monitoring technique and reviewed  goal blood pressure. Recommended to check home blood pressure and heart rate daily -check twice daily and [name] readings to appt -RESTART Carvedilol  25mg  at 1/2 tab BID  Diabetes: - Currently uncontrolled - Reviewed long term cardiovascular and renal outcomes of uncontrolled blood sugar - Reviewed goal A1c, goal fasting, and goal 2 hour post prandial glucose - Reviewed dietary modifications including low carb diet - Patient denies personal or family history of multiple endocrine neoplasia type 2, medullary thyroid  cancer; personal history of pancreatitis or gallbladder disease. - Recommend to check glucose once daily -START Ozempic 0.25mg , Continue farxiga  10mg  daily and insulin , counseled on how to correctly monitor BG.  -Call office with any BG concerns prior to scheduled follow up       Follow Up Plan: 1 week Jon VEAR Lindau, PharmD Clinical Pharmacist [phone]

## 2024-07-07 ENCOUNTER — Telehealth: Payer: Self-pay | Admitting: *Deleted

## 2024-07-07 NOTE — Progress Notes (Signed)
 Complex Care Management Note Care Guide Note  07/07/2024 Name: [name] MRN: [id number] DOB: [birth year]   Complex Care Management Outreach Attempts: An unsuccessful telephone outreach was attempted today to offer the patient information about available complex care management services.  Follow Up Plan:  Additional outreach attempts will be made to offer the patient complex care management information and services.   Encounter Outcome:  No Answer  Thedford Franks, CMA [place]  [facility], [facility] Guide Direct Dial: [phone]  Fax: [phone] Website: [email or web address]

## 2024-07-08 NOTE — Progress Notes (Signed)
 Complex Care Management Note Care Guide Note  07/08/2024 Name: [name] MRN: [id number] DOB: May 30, 1951   Complex Care Management Outreach Attempts: A second unsuccessful outreach was attempted today to offer the patient with information about available complex care management services.  Follow Up Plan:  Additional outreach attempts will be made to offer the patient complex care management information and services.   Encounter Outcome:  No Answer  Thedford Franks, CMA [place]  [facility], [facility] Guide Direct Dial: [phone]  Fax: [phone] Website: [email or web address]

## 2024-07-11 NOTE — Progress Notes (Signed)
 Complex Care Management Note Care Guide Note  07/11/2024 Name: [name] MRN: [id number] DOB: 12-12-1950   Complex Care Management Outreach Attempts: A third unsuccessful outreach was attempted today to offer the patient with information about available complex care management services.  Follow Up Plan:  No further outreach attempts will be made at this time. We have been unable to contact the patient to offer or enroll patient in complex care management services.  Encounter Outcome:  No Answer  Thedford Franks, CMA [place]  [facility], [facility] Guide Direct Dial: [phone]  Fax: [phone] Website: [email or web address]

## 2024-07-13 ENCOUNTER — Ambulatory Visit (INDEPENDENT_AMBULATORY_CARE_PROVIDER_SITE_OTHER)

## 2024-07-13 ENCOUNTER — Other Ambulatory Visit: Payer: Self-pay | Admitting: Family Medicine

## 2024-07-13 DIAGNOSIS — E1165 Type 2 diabetes mellitus with hyperglycemia: Secondary | ICD-10-CM

## 2024-07-13 DIAGNOSIS — I10 Essential (primary) hypertension: Secondary | ICD-10-CM

## 2024-07-13 MED ORDER — NOVOLIN 70/30 FLEXPEN (70-30) 100 UNIT/ML ~~LOC~~ SUPN: PEN_INJECTOR | SUBCUTANEOUS | 0 refills | Status: DC

## 2024-07-13 NOTE — Progress Notes (Signed)
 01/11/2024 Name: [name] MRN: [id number] DOB: September 29, 1951  Chief Complaint  Patient presents with   Medication Management   Diabetes   Hypertension    [name] is a 73 y.o. year old male who was referred for medication management by their primary care provider, [name] Clotilda SAUNDERS, MD. They presented for a face to face visit today.   They were referred to the pharmacist by their PCP for assistance in managing diabetes, hypertension, and complex medication management    Subjective:  Care Team: Primary Care Provider: [name], MD  Medication Access/Adherence  [phone] [place], [place] - [address] [place] [address] Phone: [phone] Fax: [phone]  [facility] [place] (New Address) - [place], [place] - [address] [facility] AT Previously: [place], [place] [address] [facility] Building 2 4th Floor [address] [place] [address] Phone: [phone] Fax: [phone]  [facility] - [place]  - [place], [place] - [address] [place] [address] Phone: [phone] Fax: [phone]   Patient reports affordability concerns with their medications: No  Patient reports access/transportation concerns to their pharmacy: No  Patient reports adherence concerns with their medications:  YES - STOPS MEDS ON HIS OWN DUE TO NOT FEELING GOOD when taking them all  Hypertension:  Current medications:  Carvedilol  25mg  BID - Been taking 1/2 tab BID x 1 week Medications previously tried: Diltiazem , Metoprolol , Losartan , Lisinopril , Triamterene /hydrochlorothiazide ,  Patient has a validated, automated, upper arm home BP cuff Current blood pressure readings: 200/104, comes down to 187/94 at recheck. Patient declines to go to ER and denies any symptoms of high BP    Diabetes:  Current medications: Novolin  70/30 BID 21 units in  morning, 22 at night, Farxiga  10mg  Medications tried in the past: None   Current glucose readings:    Never started Ozempic and reports today he would prefer to not start that medication  Observed patterns:  Patient denies hypoglycemic s/sx including dizziness, shakiness, sweating. Patient denies hyperglycemic symptoms including polyuria, polydipsia, polyphagia, nocturia, neuropathy, blurred vision.   Objective:  Lab Results  Component Value Date   HGBA1C 9.4 (H) 03/23/2024    Lab Results  Component Value Date   CREATININE 2.25 (H) 03/23/2024   BUN 33 (H) 03/23/2024   NA 140 03/23/2024   K 4.4 03/23/2024   CL 102 03/23/2024   CO2 29 03/23/2024    Lab Results  Component Value Date   CHOL 153 03/23/2024   HDL 50.30 03/23/2024   LDLCALC 86 03/23/2024   LDLDIRECT 98 09/04/2022   TRIG 83.0 03/23/2024   CHOLHDL 3 03/23/2024    Medications Reviewed Today     Reviewed by [name], RPH (Pharmacist) on 07/13/24 at 1248  Med List Status: <None>   Medication Order Taking? Sig Documenting Provider Last Dose Status Informant  ASPIRIN  LOW DOSE 81 MG tablet [id number]  TAKE 1 TABLET (81 MG TOTAL) BY MOUTH DAILY. SWALLOW WHOLE. [name] Clotilda SAUNDERS, MD  Active   atorvastatin  (LIPITOR) 80 MG tablet [id number] Yes TAKE 1 TABLET BY MOUTH EVERY DAY [name] Clotilda SAUNDERS, MD  Active   blood glucose meter kit and supplies [id number]  Dispense based on patient and insurance preference. Use to check glucose daily (FOR ICD-10 E10.9, E11.9). Alvia Bring, DO  Active   Blood Glucose Monitoring Suppl (ONE TOUCH ULTRA 2) w/Device KIT [id number]  Use to check blood sugars twice daily  as directed. Dx E11.9 [name] Clotilda SAUNDERS, MD  Active   carvedilol  (COREG ) 25 MG tablet [id number] Yes TAKE 1 TABLET BY MOUTH TWICE A DAY [name] Clotilda SAUNDERS, MD  Active   cholecalciferol (VITAMIN D3) 25 MCG (1000 UNIT) tablet [id number]  Take 1,000 Units by mouth daily. [provider]  Active   Continuous Glucose  Sensor (DEXCOM G7 SENSOR) MISC [id number]  Use to monitor blood sugar continuously. Change sensor every 10 days. E11.65 [name] Clotilda SAUNDERS, MD  Active   dapagliflozin  propanediol (FARXIGA ) 10 MG TABS tablet [id number] Yes Take 10 mg by mouth daily. [provider]  Active   Ferrous Sulfate Dried (FERROUS SULFATE CR PO) [id number] Yes Take 325 mg by mouth daily at 2 PM. [provider]  Active   glucose blood (ONETOUCH ULTRA TEST) test strip [id number]  Use to check blood sugar twice daily as directed E11.9 [name] Clotilda SAUNDERS, MD  Active   insulin  isophane & regular human KwikPen (NOVOLIN  70/30 KWIKPEN) (70-30) 100 UNIT/ML KwikPen [id number] Yes Inject 26 Units into the skin daily before breakfast AND 26 Units at bedtime. [name] Clotilda SAUNDERS, MD  Active   Insulin  Pen Needle (BD PEN NEEDLE NANO 2ND GEN) 32G X 4 MM MISC [id number] Yes To use with insulin  pen twice a day. [name] Clotilda SAUNDERS, MD  Active   Lancet Devices (ONE TOUCH DELICA LANCING DEV) MISC [id number]  1 Device by Does not apply route as directed. Shamleffer, Donell Cardinal, MD  Active   Lancets [facility] CATHRYNE PLUS [name]) MISC [id number]  Use 1 each to test sugars twice daily as directed E11.9 [name] Clotilda SAUNDERS, MD  Active   latanoprost (XALATAN) 0.005 % ophthalmic solution [id number] Yes  [provider]  Active   Semaglutide,0.25 or 0.5MG /DOS, (OZEMPIC, 0.25 OR 0.5 MG/DOSE,) 2 MG/3ML SOPN [id number]  Inject 0.25 mg into the skin once a week. PAP pending, sample box provided in office [provider]  Active               Assessment/Plan:    Hypertension: - Currently controlled per patient report at home but elevated in office today - Reviewed long term cardiovascular and renal outcomes of uncontrolled blood pressure - Reviewed appropriate blood pressure monitoring technique and reviewed goal blood pressure. Recommended to check home blood pressure and heart rate daily -check twice daily and bring readings  to appt -RESTART Carvedilol  25mg  at 1 tab BID  Diabetes: - Currently uncontrolled - Reviewed long term cardiovascular and renal outcomes of uncontrolled blood sugar - Reviewed goal A1c, goal fasting, and goal 2 hour post prandial glucose - Reviewed dietary modifications including low carb diet - Patient denies personal or family history of multiple endocrine neoplasia type 2, medullary thyroid  cancer; personal history of pancreatitis or gallbladder disease. - Recommend to check glucose once daily -INCREASE Novolin  70/30 to 26 units BID. Continue farxiga  10mg  1 tab daily -Call office with any BG concerns prior to scheduled follow up       Follow Up Plan: 1 week Jon VEAR Lindau, PharmD Clinical Pharmacist [phone]

## 2024-07-20 ENCOUNTER — Ambulatory Visit

## 2024-07-20 DIAGNOSIS — I10 Essential (primary) hypertension: Secondary | ICD-10-CM

## 2024-07-20 DIAGNOSIS — E1165 Type 2 diabetes mellitus with hyperglycemia: Secondary | ICD-10-CM

## 2024-07-20 MED ORDER — TRAZODONE HCL 50 MG PO TABS: 50.0000 mg | ORAL_TABLET | Freq: Every day | ORAL | 0 refills | Status: DC

## 2024-07-20 NOTE — Progress Notes (Signed)
 01/11/2024 Name: [name] MRN: [id number] DOB: 1951-07-13  Chief Complaint  Patient presents with   Medication Management   Diabetes   Hypertension    [name] is a 73 y.o. year old male who was referred for medication management by their primary care provider, [name] Clotilda SAUNDERS, MD. They presented for a face to face visit today.   They were referred to the pharmacist by their PCP for assistance in managing diabetes, hypertension, and complex medication management    Subjective:  Care Team: Primary Care Provider: [name], MD  Medication Access/Adherence  [facility]  [phone] [place], [place] - [address] [place] [address] Phone: [phone] Fax: [phone]  [facility] (New Address) - [place], [place] - [address] [facility] AT Previously: [name], [place] [address] [facility] Building 2 4th Floor [address] [place] [address] Phone: [phone] Fax: [phone]  [facility] - [place]  - [place], [place] - [address] [place] [address] Phone: [phone] Fax: [phone]   Patient reports affordability concerns with their medications: No  Patient reports access/transportation concerns to their pharmacy: No  Patient reports adherence concerns with their medications:  YES - STOPS MEDS ON HIS OWN DUE TO NOT FEELING GOOD when taking them all  Hypertension:  Current medications:  Carvedilol  25mg  BID - Been taking 1/2 tab BID x 1 week Medications previously tried: Diltiazem , Metoprolol , Losartan , Lisinopril , Triamterene /hydrochlorothiazide ,  Patient has a validated, automated, upper arm home BP cuff Current blood pressure readings: 190/102, does not come down at recheck. Patient declines to go to ER and denies any symptoms of high BP    Diabetes:  Current medications: Novolin  70/30 BID 21 units in  morning, 22 at night, Farxiga  10mg  Medications tried in the past: None   Current glucose readings:    Never started Ozempic and reports today he would prefer to not start that medication  Observed patterns:  Patient denies hypoglycemic s/sx including dizziness, shakiness, sweating. Patient denies hyperglycemic symptoms including polyuria, polydipsia, polyphagia, nocturia, neuropathy, blurred vision.  Insomnia No meds Has been using tylenol  pm otc but reports this is not working and he is only getting a couple of hours of sleep each night  Wants to try trazodone  again  Objective:  Lab Results  Component Value Date   HGBA1C 9.4 (H) 03/23/2024    Lab Results  Component Value Date   CREATININE 2.25 (H) 03/23/2024   BUN 33 (H) 03/23/2024   NA 140 03/23/2024   K 4.4 03/23/2024   CL 102 03/23/2024   CO2 29 03/23/2024    Lab Results  Component Value Date   CHOL 153 03/23/2024   HDL 50.30 03/23/2024   LDLCALC 86 03/23/2024   LDLDIRECT 98 09/04/2022   TRIG 83.0 03/23/2024   CHOLHDL 3 03/23/2024    Medications Reviewed Today     Reviewed by [name], RPH (Pharmacist) on 07/20/24 at 1228  Med List Status: <None>   Medication Order Taking? Sig Documenting Provider Last Dose Status Informant  ASPIRIN  LOW DOSE 81 MG tablet [id number] Yes TAKE 1 TABLET (81 MG TOTAL) BY MOUTH DAILY. SWALLOW WHOLE. [name] Clotilda SAUNDERS, MD  Active   atorvastatin  (LIPITOR) 80 MG tablet [id number] Yes TAKE 1 TABLET BY MOUTH EVERY DAY [name] Clotilda SAUNDERS, MD  Active   blood glucose meter kit and supplies [id number]  Dispense based on patient and insurance preference. Use  to check glucose daily (FOR ICD-10 E10.9, E11.9). [name] Bring, DO  Active   Blood Glucose Monitoring Suppl (ONE TOUCH ULTRA 2) w/Device KIT [id number]  Use to check blood sugars twice daily as directed. Dx E11.9 [name] Clotilda SAUNDERS, MD  Active   carvedilol  (COREG ) 25 MG tablet [id number] Yes TAKE 1 TABLET BY MOUTH TWICE A DAY [name] Clotilda SAUNDERS, MD  Active   cholecalciferol (VITAMIN D3) 25 MCG (1000 UNIT) tablet [id number] Yes Take 1,000 Units by mouth daily. [provider]  Active   Continuous Glucose Sensor (DEXCOM G7 SENSOR) MISC [id number]  Use to monitor blood sugar continuously. Change sensor every 10 days. E11.65 [name] Clotilda SAUNDERS, MD  Active   dapagliflozin  propanediol (FARXIGA ) 10 MG TABS tablet [id number] Yes Take 10 mg by mouth daily. [provider]  Active   Ferrous Sulfate Dried (FERROUS SULFATE CR PO) [id number] Yes Take 325 mg by mouth daily at 2 PM. [provider]  Active   glucose blood (ONETOUCH ULTRA TEST) test strip [id number]  Use to check blood sugar twice daily as directed E11.9 [name] Clotilda SAUNDERS, MD  Active   insulin  isophane & regular human KwikPen (NOVOLIN  70/30 KWIKPEN) (70-30) 100 UNIT/ML KwikPen [id number] Yes Inject 26 Units into the skin daily before breakfast AND 26 Units at bedtime. [name] Clotilda SAUNDERS, MD  Active   Insulin  Pen Needle (BD PEN NEEDLE NANO 2ND GEN) 32G X 4 MM MISC [id number]  To use with insulin  pen twice a day. [name] Clotilda SAUNDERS, MD  Active   Lancet Devices (ONE TOUCH DELICA LANCING DEV) MISC [id number]  1 Device by Does not apply route as directed. Shamleffer, Donell Cardinal, MD  Active   Lancets [facility] CATHRYNE PLUS [place]) MISC [id number]  Use 1 each to test sugars twice daily as directed E11.9 [name] Clotilda SAUNDERS, MD  Active   latanoprost (XALATAN) 0.005 % ophthalmic solution [id number]   [provider]  Active   Semaglutide,0.25 or 0.5MG /DOS, (OZEMPIC, 0.25 OR 0.5 MG/DOSE,) 2 MG/3ML SOPN [id number]  Inject 0.25 mg into the skin once a week. PAP pending, sample box provided in office  Patient not taking: Reported on 07/20/2024   [provider]  Active   traZODone  (DESYREL ) 50 MG tablet [id number] Yes Take 1 tablet (50 mg total) by mouth at bedtime. [name] Clotilda SAUNDERS, MD  Active               Assessment/Plan:    Hypertension: -  Currently controlled per patient report at home but elevated in office today - Reviewed long term cardiovascular and renal outcomes of uncontrolled blood pressure - Reviewed appropriate blood pressure monitoring technique and reviewed goal blood pressure. Recommended to check home blood pressure and heart rate daily -check twice daily and [name] readings to appt -Continue current med therapy. Counseled on importance of adherence to medications and taking consistently  Diabetes: - Currently uncontrolled - Reviewed long term cardiovascular and renal outcomes of uncontrolled blood sugar - Reviewed goal A1c, goal fasting, and goal 2 hour post prandial glucose - Reviewed dietary modifications including low carb diet - Patient denies personal or family history of multiple endocrine neoplasia type 2, medullary thyroid  cancer; personal history of pancreatitis or gallbladder disease. - Recommend to check glucose once daily -INCREASE Novolin  70/30 to 28 units BID. Continue farxiga  10mg  1 tab daily -Call office with any BG concerns prior to scheduled follow up    Insomnia -START trazodone  50mg  nightly   Follow Up Plan: 1 week Jon  VEAR Lindau, PharmD Clinical Pharmacist [phone]

## 2024-07-27 ENCOUNTER — Ambulatory Visit

## 2024-07-27 NOTE — Telephone Encounter (Signed)
 Called pt to advised that Ozempic is ready for pickup. Per pt he advised that he did not want to take the medication due to weight loss. I will let pharmacist know.

## 2024-08-03 ENCOUNTER — Ambulatory Visit

## 2024-08-03 DIAGNOSIS — E1165 Type 2 diabetes mellitus with hyperglycemia: Secondary | ICD-10-CM

## 2024-08-03 MED ORDER — NOVOLIN 70/30 FLEXPEN (70-30) 100 UNIT/ML ~~LOC~~ SUPN
PEN_INJECTOR | SUBCUTANEOUS | 0 refills | Status: DC
Start: 2024-08-03 — End: 2024-09-05

## 2024-08-03 MED ORDER — DEXCOM G7 SENSOR MISC: 2 refills | Status: AC

## 2024-08-03 NOTE — Progress Notes (Signed)
 01/11/2024 Name: [name] MRN: [id number] DOB: 10-04-1951  Chief Complaint  Patient presents with   Medication Management   Diabetes   Hypertension    [name] is a 73 y.o. year old male who was referred for medication management by their primary care provider, [name], MD. They presented for a face to face visit today.   They were referred to the pharmacist by their PCP for assistance in managing diabetes, hypertension, and complex medication management    Subjective:  Care Team: Primary Care Provider: [name], MD  Medication Access/Adherence  [facility]  [phone] [place], [place] - [address] [place] [address] Phone: [phone] Fax: [phone]  [facility] [place] (New Address) - [place], [place] - [address] [facility] AT Previously: [place], [place] [address] [facility] Building 2 4th Floor [address] [place] [address] Phone: [phone] Fax: [phone]  [facility] - [place]  - [place], [place] - [address] [place] [address] Phone: [phone] Fax: [phone]   Patient reports affordability concerns with their medications: No  Patient reports access/transportation concerns to their pharmacy: No  Patient reports adherence concerns with their medications:  YES - STOPS MEDS ON HIS OWN DUE TO NOT FEELING GOOD when taking them all  Hypertension:  Current medications:  Carvedilol  25mg  BID  Medications previously tried: Diltiazem , Metoprolol , Losartan , Lisinopril , Triamterene /hydrochlorothiazide   Patient has a validated, automated, upper arm home BP cuff Current blood pressure readings: 20/100, does not come down at recheck. Patient declines to go to ER and denies any symptoms of high BP  Did not bring BP log or machine to appt as requested, reports seeing better numbers at  home  Diabetes:  Current medications: Novolin  70/30 BID 61 units in morning, 26 at night, Farxiga  10mg  Medications tried in the past: None   Current glucose readings:     Sugars trended up despite insulin  dose increase. Patient reports adherence to regimen. Does not have sensor on at time of visit, was out of refills  Observed patterns:  Patient denies hypoglycemic s/sx including dizziness, shakiness, sweating. Patient denies hyperglycemic symptoms including polyuria, polydipsia, polyphagia, nocturia, neuropathy, blurred vision.  Patient does report insulin  is expensive.   Insomnia -Taking trazodone  50mg  but reports he just resumed taking 2 days ago, he's not sure if working yet  Objective:  Lab Results  Component Value Date   HGBA1C 9.4 (H) 03/23/2024    Lab Results  Component Value Date   CREATININE 2.25 (H) 03/23/2024   BUN 33 (H) 03/23/2024   NA 140 03/23/2024   K 4.4 03/23/2024   CL 102 03/23/2024   CO2 29 03/23/2024    Lab Results  Component Value Date   CHOL 153 03/23/2024   HDL 50.30 03/23/2024   LDLCALC 86 03/23/2024   LDLDIRECT 98 09/04/2022   TRIG 83.0 03/23/2024   CHOLHDL 3 03/23/2024    Medications Reviewed Today     Reviewed by Lionell Jon DEL, RPH (Pharmacist) on 08/03/24 at 2002  Med List Status: <None>   Medication Order Taking? Sig Documenting Provider Last Dose Status Informant  ASPIRIN  LOW DOSE 81 MG tablet [id number] Yes TAKE 1 TABLET (81 MG TOTAL) BY MOUTH DAILY. SWALLOW WHOLE. [name], MD  Active   atorvastatin  (LIPITOR) 80 MG tablet [id number] Yes TAKE 1 TABLET BY MOUTH EVERY DAY [name], MD  Active   blood glucose meter  kit and supplies [id number]  Dispense based on patient and insurance preference. Use to check glucose daily (FOR ICD-10 E10.9, E11.9). Alvia Bring, DO  Active   Blood Glucose Monitoring Suppl (ONE TOUCH ULTRA 2) w/Device KIT [id number]  Use to check blood sugars twice daily as directed. Dx E11.9  [name], MD  Active   carvedilol  (COREG ) 25 MG tablet [id number] Yes TAKE 1 TABLET BY MOUTH TWICE A DAY [name], MD  Active   cholecalciferol (VITAMIN D3) 25 MCG (1000 UNIT) tablet [id number] Yes Take 1,000 Units by mouth daily. [provider]  Active     Discontinued 08/03/24 1340 (Reorder)   Continuous Glucose Sensor (DEXCOM G7 SENSOR) MISC [id number] Yes Use to monitor blood sugar continuously. Change sensor every 10 days as directed Dx E11.65 [name], MD  Active   dapagliflozin  propanediol (FARXIGA ) 10 MG TABS tablet [id number] Yes Take 10 mg by mouth daily. [provider]  Active   Ferrous Sulfate Dried (FERROUS SULFATE CR PO) [id number] Yes Take 325 mg by mouth daily at 2 PM. [provider]  Active   glucose blood (ONETOUCH ULTRA TEST) test strip [id number]  Use to check blood sugar twice daily as directed E11.9 [name], MD  Active   insulin  isophane & regular human KwikPen (NOVOLIN  70/30 KWIKPEN) (70-30) 100 UNIT/ML KwikPen [id number] Yes Inject 30 Units into the skin daily before breakfast AND 30 Units at bedtime. [name], MD  Active   Insulin  Pen Needle (BD PEN NEEDLE NANO 2ND GEN) 32G X 4 MM MISC [id number]  To use with insulin  pen twice a day. [name], MD  Active   Lancet Devices (ONE TOUCH DELICA LANCING DEV) MISC [id number]  1 Device by Does not apply route as directed. Shamleffer, Donell Cardinal, MD  Active   Lancets [facility] CATHRYNE PLUS [place]) MISC [id number]  Use 1 each to test sugars twice daily as directed E11.9 [name], MD  Active   latanoprost (XALATAN) 0.005 % ophthalmic solution [id number]    Patient not taking: Reported on 08/03/2024   [provider]  Active    Patient not taking:   Discontinued 08/03/24 2002 (Patient Preference)            Med Note>> Lionell Jon DEL, [facility]   08/03/2024  8:02 PM never started, was fearful of weight loss    traZODone  (DESYREL ) 50 MG tablet  [id number] Yes Take 1 tablet (50 mg total) by mouth at bedtime. [name], MD  Active               Assessment/Plan:    Hypertension: - Currently controlled per patient report at home but elevated in office today - Reviewed long term cardiovascular and renal outcomes of uncontrolled blood pressure - Reviewed appropriate blood pressure monitoring technique and reviewed goal blood pressure. Recommended to check home blood pressure and heart rate daily -check twice daily and bring readings to appt -Recommend restarting amlodipine  or starting spironolactone. Patient declines today and does not want med changes. Will bring BP log to next visit and reports will be open to med adjustments if still uncontrolled  Diabetes: - Currently uncontrolled - Reviewed long term cardiovascular and renal outcomes of uncontrolled blood sugar - Reviewed goal A1c, goal fasting, and goal 2 hour post prandial glucose - Reviewed dietary modifications including low carb diet - Patient denies personal or family history of multiple endocrine neoplasia type 2, medullary thyroid  cancer; personal history of  pancreatitis or gallbladder disease. - Recommend to check glucose once daily -INCREASE Novolin  70/30 to 30 units BID. Continue farxiga  10mg  1 tab daily -Plan to switch to tresiba and novolog  for access through [employer] P[employer] program as patient is already enrolled, sending in order with PCP sign off. Will start tresiba 42 units once daily and novolog  9 units twice daily with meals once arrives. Will re-counsel on this once product arrives, may take 3-4 weeks   Insomnia -Continue trazodone  50mg  nightly   Follow Up Plan: 1 week Jon VEAR Lindau, PharmD Clinical Pharmacist [phone]

## 2024-08-08 ENCOUNTER — Telehealth: Payer: Self-pay

## 2024-08-08 NOTE — Progress Notes (Signed)
   08/08/2024  Patient ID: [name], male   DOB: 06/14/1951, 73 y.o.   MRN: [id number]  Received notice from [employer] that patient has been approved to continue to receive Farxiga  through 10/26/2025.  Received notice from [employer] that patient has been approved to receive Tresiba u-100 flextouch, Novolog  FlexPen, and NovoFine needles through 10/26/2024. No longer on ozempic, will need to be re-enrolled for 2026.  Notifying CPhT.  Jon VEAR Lindau, PharmD Clinical Pharmacist [phone]

## 2024-08-11 NOTE — Progress Notes (Addendum)
 [name]                                          MRN: [id number]   08/11/2024   The [facility] Quality Team Specialist reviewed this patient medical record for the purposes of chart review for care gap closure. The following were reviewed: chart review for care gap closure-glycemic status assessment.  Cbp out of range.  11/22/2024- ABSTRACTED GSD 2025   [facility]BCI Quality Team

## 2024-08-16 ENCOUNTER — Telehealth: Payer: Self-pay

## 2024-08-16 NOTE — Telephone Encounter (Signed)
 Patient was identified as falling into the [place] Measure - Diabetes.   Patient was: Appointment already scheduled for:  08/31/2024 with Dr. Mercer.

## 2024-08-17 ENCOUNTER — Ambulatory Visit

## 2024-08-17 ENCOUNTER — Telehealth: Payer: Self-pay

## 2024-08-17 DIAGNOSIS — E1165 Type 2 diabetes mellitus with hyperglycemia: Secondary | ICD-10-CM

## 2024-08-17 DIAGNOSIS — I10 Essential (primary) hypertension: Secondary | ICD-10-CM

## 2024-08-17 MED ORDER — AMLODIPINE BESYLATE 2.5 MG PO TABS: 2.5000 mg | ORAL_TABLET | Freq: Every day | ORAL | 2 refills | Status: AC

## 2024-08-17 NOTE — Progress Notes (Signed)
 01/11/2024 Name: [name] MRN: [id number] DOB: 05-07-1951  Chief Complaint  Patient presents with   Medication Management   Hypertension   Diabetes    Cuong Moorman is a 73 y.o. year old male who was referred for medication management by their primary care provider, [name], MD. They presented for a face to face visit today.   They were referred to the pharmacist by their PCP for assistance in managing diabetes, hypertension, and complex medication management    Subjective:  Care Team: Primary Care Provider: [name], MD  Medication Access/Adherence  Current Pharmacy:  [facility] [phone] [place], [place] - [address] [place] [place] [address] Phone: [phone] Fax: [phone]  [facility] [place] (New Address) - [place], [place] - [address] AT Previously: [address], [place] [address] [address] [place] [place] [address] Phone: [phone] Fax: [phone]  [facility] [place]  - [place], [place] - [address] [place] [address] Phone: [phone] Fax: [phone]   Patient reports affordability concerns with their medications: No  Patient reports access/transportation concerns to their pharmacy: No  Patient reports adherence concerns with their medications:  YES - STOPS MEDS ON HIS OWN DUE TO NOT FEELING GOOD when taking them all  Hypertension:  Current medications:  Carvedilol  25mg  BID  Medications previously tried: Diltiazem , Metoprolol , Losartan , Lisinopril , Triamterene /hydrochlorothiazide , Amlodipine   Patient has a validated, automated, upper arm home BP cuff Current blood pressure readings: 208/113, does not come down at recheck. Patient declines to go to ER and denies any symptoms of high BP  Did not bring BP log or machine to appt as requested, reports seeing elevated readings at home  as well  Diabetes:  Current medications: Novolin  70/30 BID 30 units in morning, 30 units at night, Farxiga  10mg  Medications tried in the past: None   Current glucose readings:     Sugars still elevated but have improved since increasing insulin  dose 2 weeks ago. Did have 1 low in upper 50s-60s last night, reports he may have waited too long to eat after injecting insulin   Observed patterns:  Patient denies hypoglycemic s/sx including dizziness, shakiness, sweating. Patient denies hyperglycemic symptoms including polyuria, polydipsia, polyphagia, nocturia, neuropathy, blurred vision.  Still waiting for approved tresiba/novolog  insulin  PAP to arrive at office, Using the Novolin  rx in the interim  Insomnia -Taking trazodone  50mg  prn sleep and reports this is working well  Objective:  Lab Results  Component Value Date   HGBA1C 9.4 (H) 03/23/2024    Lab Results  Component Value Date   CREATININE 2.25 (H) 03/23/2024   BUN 33 (H) 03/23/2024   NA 140 03/23/2024   K 4.4 03/23/2024   CL 102 03/23/2024   CO2 29 03/23/2024    Lab Results  Component Value Date   CHOL 153 03/23/2024   HDL 50.30 03/23/2024   LDLCALC 86 03/23/2024   LDLDIRECT 98 09/04/2022   TRIG 83.0 03/23/2024   CHOLHDL 3 03/23/2024    Medications Reviewed Today     Reviewed by Lionell Jon DEL, RPH (Pharmacist) on 08/17/24 at 1201  Med List Status: <None>   Medication Order Taking? Sig Documenting Provider Last Dose Status Informant  ASPIRIN  LOW DOSE 81 MG tablet [id number] Yes TAKE 1 TABLET (81 MG TOTAL) BY MOUTH DAILY. SWALLOW WHOLE. [name], MD  Active   atorvastatin  (LIPITOR) 80 MG tablet [id number] Yes TAKE  1 TABLET BY MOUTH EVERY DAY [name], MD  Active   blood glucose meter kit and supplies [id number]  Dispense based on patient and insurance preference. Use to check glucose daily (FOR ICD-10 E10.9, E11.9). Alvia Bring, DO  Active   Blood Glucose Monitoring Suppl (ONE TOUCH ULTRA  2) w/Device KIT [id number]  Use to check blood sugars twice daily as directed. Dx E11.9 [name], MD  Active   carvedilol  (COREG ) 25 MG tablet [id number] Yes TAKE 1 TABLET BY MOUTH TWICE A DAY [name], MD  Active   cholecalciferol (VITAMIN D3) 25 MCG (1000 UNIT) tablet [id number]  Take 1,000 Units by mouth daily. [provider]  Active   Continuous Glucose Sensor (DEXCOM G7 SENSOR) [place] [id number]  Use to monitor blood sugar continuously. Change sensor every 10 days as directed Dx E11.65 [name], MD  Active   dapagliflozin  propanediol (FARXIGA ) 10 MG TABS tablet [id number] Yes Take 10 mg by mouth daily. [provider]  Active   Ferrous Sulfate Dried (FERROUS SULFATE CR PO) [id number] Yes Take 325 mg by mouth daily at 2 PM. [provider]  Active   glucose blood (ONETOUCH ULTRA TEST) test strip [id number]  Use to check blood sugar twice daily as directed E11.9 [name], MD  Active   insulin  isophane & regular human KwikPen (NOVOLIN  70/30 KWIKPEN) (70-30) 100 UNIT/ML KwikPen [id number] Yes Inject 30 Units into the skin daily before breakfast AND 30 Units at bedtime. [name], MD  Active   Insulin  Pen Needle (BD PEN NEEDLE NANO 2ND GEN) 32G X 4 MM MISC [id number]  To use with insulin  pen twice a day. [name], MD  Active   Lancet Devices (ONE TOUCH DELICA LANCING DEV) MISC [id number]  1 Device by Does not apply route as directed. Shamleffer, Donell Cardinal, MD  Active   Lancets [facility] CAT[place] PLUS [place]) MISC [id number]  Use 1 each to test sugars twice daily as directed E11.9 [name], MD  Active   latanoprost (XALATAN) 0.005 % ophthalmic solution [id number]    Patient not taking: Reported on 08/03/2024   [provider]  Active   traZODone  (DESYREL ) 50 MG tablet [id number]  Take 1 tablet (50 mg total) by mouth at bedtime. [name], MD  Active               Assessment/Plan:    Hypertension: -  Currently controlled per patient report at home but elevated in office today - Reviewed long term cardiovascular and renal outcomes of uncontrolled blood pressure - Reviewed appropriate blood pressure monitoring technique and reviewed goal blood pressure. Recommended to check home blood pressure and heart rate daily -check twice daily and bring readings to appt -RESTART Amlodipine  2.5mg  daily, continue carvedilol   Diabetes: - Currently uncontrolled - Reviewed long term cardiovascular and renal outcomes of uncontrolled blood sugar - Reviewed goal A1c, goal fasting, and goal 2 hour post prandial glucose - Reviewed dietary modifications including low carb diet - Patient denies personal or family history of multiple endocrine neoplasia type 2, medullary thyroid  cancer; personal history of pancreatitis or gallbladder disease. - Recommend to check glucose once daily -INCREASE Novolin  70/30 to 32 units in morning, continue 30 units in evening due to recent overnight low. Continue farxiga  10mg  1 tab daily -Plan to switch to tresiba and novolog  for access through [employer] program as soon as shipment arrives. Following up with company today Will start tresiba 42  units once daily and novolog  9 units twice daily with meals once arrives. Will re-counsel on this once product arrives, may take 3-4 weeks   Insomnia -Continue trazodone  50mg  nightly   Follow Up Plan: 2 weeks Jon VEAR [place]au, PharmD Clinical Pharmacist [phone]

## 2024-08-17 NOTE — Telephone Encounter (Signed)
 Call [facility] pt has not received refill on Tresiba,Novolog  and needles. Spoke with a representative said they have mail out all three meds they started the process on Oct. 13 th it takes 10-14 days for pt to received it.

## 2024-08-18 ENCOUNTER — Other Ambulatory Visit: Payer: Self-pay | Admitting: Family Medicine

## 2024-08-31 ENCOUNTER — Ambulatory Visit

## 2024-08-31 ENCOUNTER — Ambulatory Visit: Admitting: Family Medicine

## 2024-08-31 DIAGNOSIS — Z794 Long term (current) use of insulin: Secondary | ICD-10-CM

## 2024-08-31 DIAGNOSIS — N184 Chronic kidney disease, stage 4 (severe): Secondary | ICD-10-CM

## 2024-08-31 DIAGNOSIS — I10 Essential (primary) hypertension: Secondary | ICD-10-CM

## 2024-09-01 ENCOUNTER — Other Ambulatory Visit: Payer: Self-pay | Admitting: Family Medicine

## 2024-09-01 DIAGNOSIS — E1165 Type 2 diabetes mellitus with hyperglycemia: Secondary | ICD-10-CM

## 2024-09-02 ENCOUNTER — Ambulatory Visit: Admitting: Family Medicine

## 2024-09-02 ENCOUNTER — Encounter: Payer: Self-pay | Admitting: Family Medicine

## 2024-09-02 VITALS — BP 212/110 | HR 80 | Temp 97.0°F | Ht 66.0 in | Wt 154.0 lb

## 2024-09-02 DIAGNOSIS — E1122 Type 2 diabetes mellitus with diabetic chronic kidney disease: Secondary | ICD-10-CM | POA: Diagnosis not present

## 2024-09-02 DIAGNOSIS — E785 Hyperlipidemia, unspecified: Secondary | ICD-10-CM

## 2024-09-02 DIAGNOSIS — N184 Chronic kidney disease, stage 4 (severe): Secondary | ICD-10-CM | POA: Diagnosis not present

## 2024-09-02 DIAGNOSIS — E1165 Type 2 diabetes mellitus with hyperglycemia: Secondary | ICD-10-CM | POA: Diagnosis not present

## 2024-09-02 DIAGNOSIS — E1169 Type 2 diabetes mellitus with other specified complication: Secondary | ICD-10-CM

## 2024-09-02 DIAGNOSIS — I10 Essential (primary) hypertension: Secondary | ICD-10-CM

## 2024-09-02 LAB — MICROALBUMIN / CREATININE URINE RATIO
Creatinine,U: 78.9 mg/dL
Microalb Creat Ratio: 499.2 mg/g — ABNORMAL HIGH (ref 0.0–30.0)
Microalb, Ur: 39.4 mg/dL — ABNORMAL HIGH (ref 0.0–1.9)

## 2024-09-02 NOTE — Progress Notes (Signed)
 Established Patient Office Visit   Subjective  Patient ID: [name], male    DOB: [birth year]  Age: 73 y.o. MRN: [id number]  Chief Complaint  Patient presents with   Medical Management of Chronic Issues    Patient came in today for 6 month follow-up     Patient is a 73 year old male seen for follow-up on chronic issues.  Patient did not take medications this morning.  Patient states he is taking his medications but one of them is causing dizziness.  Last 3-4 hrs.  Also with leg cramps x 2 months.  On lipitor 80 mg.  Has cgm Dexcom G7, but notes difficulty obtaining from pharmacy as they weren't in stock.  Last sensor did not work.  Taking Novolin  70/30 20 units in a.m. and 30 2 in the evening.  Concern blood sugar is getting too low at bedtime.  Had a 68 reading.  Eats dinner around 4 or 5 PM.  Taking Farxiga  10 mg half tab twice daily.  States this may be the medication causing dizziness/making him miserable.  Pt cooks at home.    Patient Active Problem List   Diagnosis Date Noted   Diabetic ulcer of toe of right foot associated with type 2 diabetes mellitus (HCC) 07/03/2023   Chronic kidney disease, stage 3b (HCC) 01/23/2023   TIA (transient ischemic attack) 01/22/2023   Delayed sleep phase syndrome 11/13/2020   Prostate cancer (HCC) 09/16/2020   Severe nonproliferative diabetic retinopathy of right eye with macular edema associated with type 2 diabetes mellitus (HCC) 08/28/2020   Age-related nuclear cataract, bilateral 08/28/2020   Hypertensive retinopathy of both eyes 08/28/2020   Bilateral carotid artery stenosis 08/28/2020   Murmur 08/28/2020   Diabetic macular edema (HCC) 07/13/2020   Mild nonproliferative diabetic retinopathy of right eye with macular edema associated with type 2 diabetes mellitus (HCC) 07/13/2020   BPH associated with nocturia 02/21/2020   Type 2 diabetes mellitus with diabetic polyneuropathy, without long-term current use of insulin  (HCC) 07/06/2019    Dyslipidemia 07/06/2019   ED (erectile dysfunction) 12/24/2018   Osteoarthritis 11/25/2018   Uncontrolled type 2 diabetes mellitus with hyperglycemia (HCC) 11/12/2018   Hyperlipidemia associated with type 2 diabetes mellitus (HCC) 11/12/2018   Malignant hypertension 11/10/2018   Insomnia 11/10/2018   Diabetes (HCC) 01/23/2016   Depression 01/22/2016   Past Medical History:  Diagnosis Date   CKD (chronic kidney disease)    Diabetes mellitus without complication (HCC)    Hyperlipidemia    Hypertension    Insomnia    Prostate cancer (HCC)    Past Surgical History:  Procedure Laterality Date   PROSTATE BIOPSY     Social History   Tobacco Use   Smoking status: Never   Smokeless tobacco: Never  Vaping Use   Vaping status: Never Used  Substance Use Topics   Alcohol use: No   Drug use: No   Family History  Problem Relation Age of Onset   Hypertension Mother    Colon cancer Mother    Diabetes Brother    Hypertension Daughter    Breast cancer Neg Hx    Pancreatic cancer Neg Hx    Prostate cancer Neg Hx    No Known Allergies  ROS Negative unless stated above    Objective:     BP (!) 212/110 (BP Location: Left Arm, Patient Position: Sitting, Cuff Size: Large)   Pulse 80   Temp (!) 97 F (36.1 C) (Oral)   Ht 5' 6 (1.676 m)  Wt 154 lb (69.9 kg)   SpO2 98%   BMI 24.86 kg/m  BP Readings from Last 3 Encounters:  05/18/24 124/70  04/20/24 128/76  03/23/24 (!) 154/86   Wt Readings from Last 3 Encounters:  03/23/24 152 lb 9.6 oz (69.2 kg)  11/11/23 159 lb (72.1 kg)  09/02/23 162 lb 6.4 oz (73.7 kg)      Physical Exam Constitutional:      General: He is not in acute distress.    Appearance: Normal appearance.  HENT:     Head: Normocephalic and atraumatic.     Nose: Nose normal.     Mouth/Throat:     Mouth: Mucous membranes are moist.  Cardiovascular:     Rate and Rhythm: Normal rate and regular rhythm.     Heart sounds: Normal heart sounds. No murmur  heard.    No gallop.  Pulmonary:     Effort: Pulmonary effort is normal. No respiratory distress.     Breath sounds: Normal breath sounds. No wheezing, rhonchi or rales.  Skin:    General: Skin is warm and dry.  Neurological:     Mental Status: He is alert and oriented to person, place, and time.        03/23/2024    8:55 AM 09/02/2023    4:14 PM 01/07/2023   11:02 AM  Depression screen PHQ 2/9  Decreased Interest  0 0  Down, Depressed, Hopeless  0 0  PHQ - 2 Score  0 0  Altered sleeping 0 0 0  Tired, decreased energy 2 0 0  Change in appetite 0 0 0  Feeling bad or failure about yourself  0 0 0  Trouble concentrating 1 0 0  Moving slowly or fidgety/restless 0 0 0  Suicidal thoughts 0 0 0  PHQ-9 Score  0  0   Difficult doing work/chores Not difficult at all Not difficult at all Not difficult at all     Data saved with a previous flowsheet row definition      03/23/2024    8:55 AM 09/02/2023    4:14 PM 11/26/2022   11:11 AM  GAD 7 : Generalized Anxiety Score  Nervous, Anxious, on Edge 0 0 0  Control/stop worrying 0 0 0  Worry too much - different things 0 0 0  Trouble relaxing 1 0 0  Restless 0 0 0  Easily annoyed or irritable 0 0 0  Afraid - awful might happen 0 0 0  Total GAD 7 Score 1 0 0  Anxiety Difficulty Not difficult at all Not difficult at all Not difficult at all     No results found for any visits on 09/02/24.    Assessment & Plan:   Malignant hypertension -     Comprehensive metabolic panel with GFR; Future  Uncontrolled type 2 diabetes mellitus with hyperglycemia (HCC) -     Microalbumin / creatinine urine ratio -     Comprehensive metabolic panel with GFR; Future -     Hemoglobin A1c; Future  CKD (chronic kidney disease) stage 4, GFR 15-29 ml/min (HCC) -     Comprehensive metabolic panel with GFR; Future  Hyperlipidemia associated with type 2 diabetes mellitus (HCC)   BP uncontrolled.  Did not take meds this am.  Concern pt may be confusing  meds as unable to recognize them by name.  Discussed sleep study.  Pt declines as told it wouldn't be helpful due to his insomnia.  Discussed the risks a/w uncontrolled HTN  including damage to heart, eyes, kidneys, stroke, etc.  Lifestyle modifications strongly encouraged however concerned patient is eating frozen meals due to convenience. Was seen by [facility] but no recent f/u.  Also seen by clinic pharmacist for chronic issues.  Continue coreg  25 mg BID and norvasc  2.5 mg.  Advised to take Lipitor 80 mg a few times per wk to see if notes improvement in muscle cramps.  Given sample of Dexcom G7 until pharmacy can get some in stock.  Consider switching pharmacies for continued issues.   Return in about 2 months (around 11/02/2024).   Clotilda JONELLE Single, MD

## 2024-09-06 ENCOUNTER — Ambulatory Visit: Payer: Self-pay | Admitting: Family Medicine

## 2024-09-07 ENCOUNTER — Ambulatory Visit (INDEPENDENT_AMBULATORY_CARE_PROVIDER_SITE_OTHER): Admitting: Family Medicine

## 2024-09-07 ENCOUNTER — Ambulatory Visit (INDEPENDENT_AMBULATORY_CARE_PROVIDER_SITE_OTHER)

## 2024-09-07 ENCOUNTER — Ambulatory Visit: Payer: Self-pay | Admitting: Family Medicine

## 2024-09-07 ENCOUNTER — Encounter: Payer: Self-pay | Admitting: Family Medicine

## 2024-09-07 VITALS — BP 184/96 | HR 76 | Temp 97.6°F | Ht 66.0 in | Wt 155.6 lb

## 2024-09-07 DIAGNOSIS — I6523 Occlusion and stenosis of bilateral carotid arteries: Secondary | ICD-10-CM

## 2024-09-07 DIAGNOSIS — E113411 Type 2 diabetes mellitus with severe nonproliferative diabetic retinopathy with macular edema, right eye: Secondary | ICD-10-CM

## 2024-09-07 DIAGNOSIS — R011 Cardiac murmur, unspecified: Secondary | ICD-10-CM | POA: Diagnosis not present

## 2024-09-07 DIAGNOSIS — E1142 Type 2 diabetes mellitus with diabetic polyneuropathy: Secondary | ICD-10-CM | POA: Diagnosis not present

## 2024-09-07 DIAGNOSIS — E1165 Type 2 diabetes mellitus with hyperglycemia: Secondary | ICD-10-CM

## 2024-09-07 DIAGNOSIS — G4700 Insomnia, unspecified: Secondary | ICD-10-CM

## 2024-09-07 DIAGNOSIS — I10 Essential (primary) hypertension: Secondary | ICD-10-CM

## 2024-09-07 DIAGNOSIS — Z0000 Encounter for general adult medical examination without abnormal findings: Secondary | ICD-10-CM | POA: Diagnosis not present

## 2024-09-07 DIAGNOSIS — E782 Mixed hyperlipidemia: Secondary | ICD-10-CM | POA: Diagnosis not present

## 2024-09-07 DIAGNOSIS — Z8546 Personal history of malignant neoplasm of prostate: Secondary | ICD-10-CM | POA: Diagnosis not present

## 2024-09-07 DIAGNOSIS — N184 Chronic kidney disease, stage 4 (severe): Secondary | ICD-10-CM

## 2024-09-07 DIAGNOSIS — Z794 Long term (current) use of insulin: Secondary | ICD-10-CM

## 2024-09-07 LAB — COMPREHENSIVE METABOLIC PANEL WITH GFR
ALT: 11 U/L (ref 0–53)
AST: 16 U/L (ref 0–37)
Albumin: 3.9 g/dL (ref 3.5–5.2)
Alkaline Phosphatase: 84 U/L (ref 39–117)
BUN: 34 mg/dL — ABNORMAL HIGH (ref 6–23)
CO2: 27 meq/L (ref 19–32)
Calcium: 9.3 mg/dL (ref 8.4–10.5)
Chloride: 105 meq/L (ref 96–112)
Creatinine, Ser: 2.42 mg/dL — ABNORMAL HIGH (ref 0.40–1.50)
GFR: 26.09 mL/min — ABNORMAL LOW (ref >60.00)
Glucose, Bld: 200 mg/dL — ABNORMAL HIGH (ref 70–99)
Potassium: 4.7 meq/L (ref 3.5–5.1)
Sodium: 142 meq/L (ref 135–145)
Total Bilirubin: 0.4 mg/dL (ref 0.2–1.2)
Total Protein: 6.6 g/dL (ref 6.0–8.3)

## 2024-09-07 LAB — T4, FREE: Free T4: 0.73 ng/dL (ref 0.60–1.60)

## 2024-09-07 LAB — LIPID PANEL
Cholesterol: 151 mg/dL (ref 0–200)
HDL: 43.4 mg/dL (ref >39.00)
LDL Cholesterol: 86 mg/dL (ref 0–99)
NonHDL: 107.7
Total CHOL/HDL Ratio: 3
Triglycerides: 110 mg/dL (ref 0.0–149.0)
VLDL: 22 mg/dL (ref 0.0–40.0)

## 2024-09-07 LAB — PSA, MEDICARE: PSA: 0.15 ng/mL (ref 0.10–4.00)

## 2024-09-07 LAB — HEMOGLOBIN A1C: Hgb A1c MFr Bld: 8.4 % — ABNORMAL HIGH (ref 4.6–6.5)

## 2024-09-07 LAB — TSH: TSH: 1.25 u[IU]/mL (ref 0.35–5.50)

## 2024-09-07 NOTE — Progress Notes (Signed)
 Chief Complaint  Patient presents with   Medicare Wellness     Subjective:   [name] is a 73 y.o. male who presents for a Medicare Annual Wellness Visit.  Pt states he is still having dizziness when taking blood pressure pills twice a day.  Had noticed less muscle cramps since taking lipitor a few times per wk.  Has cramps the next am on days he take it.  Eating corn flakes or other cereal in am for breakfast. Typically has lunch out as he is working.  Will cook something for dinner.  May eat a frozen spaghetti meal, but has cut down.  Hasn't seen the eye doctor in a while.  Continues to report blurred vision.  Feels like it started after coming out of hyperbaric chamber for foot wound.  Thinks he has an appt with Nephro soon.    Allergies (verified) Patient has no known allergies.   History: Past Medical History:  Diagnosis Date   CKD (chronic kidney disease)    Diabetes mellitus without complication (HCC)    Hyperlipidemia    Hypertension    Insomnia    Prostate cancer (HCC)    Past Surgical History:  Procedure Laterality Date   PROSTATE BIOPSY     Family History  Problem Relation Age of Onset   Hypertension Mother    Colon cancer Mother    Diabetes Brother    Hypertension Daughter    Breast cancer Neg Hx    Pancreatic cancer Neg Hx    Prostate cancer Neg Hx    Social History   Occupational History    Comment: retired  Tobacco Use   Smoking status: Never   Smokeless tobacco: Never  Vaping Use   Vaping status: Never Used  Substance and Sexual Activity   Alcohol use: No   Drug use: No   Sexual activity: Yes   Tobacco Counseling Counseling given: Not Answered  SDOH Screenings   Food Insecurity: Unknown (09/07/2024)  Housing: Unknown (09/07/2024)  Transportation Needs: No Transportation Needs (09/07/2024)  Utilities: Not At Risk (09/07/2024)  Alcohol Screen: Low Risk  (07/03/2022)  Depression (PHQ2-9): Low Risk  (09/07/2024)  Financial Resource  Strain: Low Risk  (09/04/2022)  Physical Activity: Inactive (09/07/2024)  Social Connections: Socially Isolated (09/07/2024)  Stress: No Stress Concern Present (09/07/2024)  Tobacco Use: Low Risk  (09/07/2024)  Health Literacy: Inadequate Health Literacy (09/07/2024)   Depression Screen    09/07/2024   10:56 AM 09/02/2024   10:20 AM 09/02/2023    4:14 PM 07/03/2023   11:45 AM 01/07/2023   11:02 AM 11/26/2022   11:11 AM 07/17/2022    9:17 AM  PHQ 2/9 Scores  PHQ - 2 Score 0 0 0  0 0 0  PHQ- 9 Score 3 1 0   0  0  0   Exception Documentation    Patient refusal        Data saved with a previous flowsheet row definition     Goals Addressed   None    Visit info / Clinical Intake: Medicare Wellness Visit Type:: Subsequent Annual Wellness Visit Persons participating in visit:: patient Medicare Wellness Visit Mode:: In-person (required for WTM) Information given by:: patient Interpreter Needed?: No Pre-visit prep was completed: no AWV questionnaire completed by patient prior to visit?: no Living arrangements:: (!) lives alone Patient's Overall Health Status Rating: good Typical amount of pain: some Does pain affect daily life?: no Are you currently prescribed opioids?: (!) yes  Dietary Habits  and Nutritional Risks How many meals a day?: 2 Eats fruit and vegetables daily?: yes Most meals are obtained by: preparing own meals In the last 2 weeks, have you had any of the following?: none Diabetic:: (!) yes Any non-healing wounds?: no How often do you check your BS?: continuous glucose monitor Would you like to be referred to a Nutritionist or for Diabetic Management? : no  Functional Status Activities of Daily Living (to include ambulation/medication): Independent Ambulation: Independent Medication Administration: Independent Home Management: Independent Manage your own finances?: yes Primary transportation is: driving Concerns about vision?: (!) yes Concerns about hearing?:  (!) yes Uses hearing aids?: no Hear whispered voice?: yes  Fall Screening Falls in the past year?: 0 Number of falls in past year: 0 Was there an injury with Fall?: 0 Fall Risk Category Calculator: 0 Patient Fall Risk Level: Low Fall Risk  Fall Risk Patient at Risk for Falls Due to: No Fall Risks Fall risk Follow up: Falls evaluation completed  Home and Transportation Safety: All rugs have non-skid backing?: N/A, no rugs All stairs or steps have railings?: N/A, no stairs Grab bars in the bathtub or shower?: yes Have non-skid surface in bathtub or shower?: yes Good home lighting?: yes Regular seat belt use?: yes Hospital stays in the last year:: no  Cognitive Assessment Difficulty concentrating, remembering, or making decisions? : no Will 6CIT or Mini Cog be Completed: yes What year is it?: 0 points What month is it?: 0 points Give patient an address phrase to remember (5 components): [address] [place], [place] About what time is it?: 0 points Say the months of the year in reverse: 4 points Repeat the address phrase from earlier: 10 points  Advance Directives (For Healthcare) Does Patient Have a Medical Advance Directive?: No Would patient like information on creating a medical advance directive?: No - Patient declined  Reviewed/Updated  Reviewed/Updated: Reviewed All (Medical, Surgical, Family, Medications, Allergies, Care Teams, Patient Goals)      Objective:    Today's Vitals   09/07/24 1006 09/07/24 1055  BP: (!) 186/104 (!) 184/96  Pulse: 76   Temp: 97.6 F (36.4 C)   TempSrc: Oral   SpO2: 99%   Weight: 155 lb 9.6 oz (70.6 kg)   Height: 5' 6 (1.676 m)    Body mass index is 25.11 kg/m. Gen. Pleasant, well developed, well-nourished, in NAD HEENT - [place]/AT, PERRL, EOMI, conjunctive clear, no scleral icterus, no nasal drainage, pharynx without erythema or exudate. Neck: No JVD, no thyromegaly, no carotid bruits Lungs: no use of accessory muscles, no  dullness to percussion, CTAB, no wheezes, rales or rhonchi Cardiovascular: RRR, 2/6 murmur RUSB.  No r/g, no peripheral edema Abdomen: BS present, soft, nontender,nondistended, no hepatosplenomegaly Musculoskeletal: No deformities, moves all four extremities, no cyanosis or clubbing, normal tone Neuro:  A&Ox3, CN II-XII intact, normal gait Skin:  Warm, dry, intact, no lesions Psych: normal affect, mood appropriate, cooperative   Current Medications (verified) Outpatient Encounter Medications as of 09/07/2024  Medication Sig   amLODipine  (NORVASC ) 2.5 MG tablet Take 1 tablet (2.5 mg total) by mouth daily.   ASPIRIN  LOW DOSE 81 MG tablet TAKE 1 TABLET (81 MG TOTAL) BY MOUTH DAILY. SWALLOW WHOLE.   atorvastatin  (LIPITOR) 80 MG tablet TAKE 1 TABLET BY MOUTH EVERY DAY   BD PEN NEEDLE NANO 2ND GEN 32G X 4 MM MISC TO USE WITH INSULIN  PEN TWICE A DAY.   blood glucose meter kit and supplies Dispense based on  patient and insurance preference. Use to check glucose daily (FOR ICD-10 E10.9, E11.9).   Blood Glucose Monitoring Suppl (ONE TOUCH ULTRA 2) w/Device KIT Use to check blood sugars twice daily as directed. Dx E11.9   carvedilol  (COREG ) 25 MG tablet TAKE 1 TABLET BY MOUTH TWICE A DAY   cholecalciferol (VITAMIN D3) 25 MCG (1000 UNIT) tablet Take 1,000 Units by mouth daily.   Continuous Glucose Sensor (DEXCOM G7 SENSOR) MISC Use to monitor blood sugar continuously. Change sensor every 10 days as directed Dx E11.65   dapagliflozin  propanediol (FARXIGA ) 10 MG TABS tablet Take 10 mg by mouth daily.   Ferrous Sulfate Dried (FERROUS SULFATE CR PO) Take 325 mg by mouth daily at 2 PM.   glucose blood (ONETOUCH ULTRA TEST) test strip Use to check blood sugar twice daily as directed E11.9   Lancet Devices (ONE TOUCH DELICA LANCING DEV) MISC 1 Device by Does not apply route as directed.   Lancets (ONETOUCH DELICA PLUS LANCET30G) MISC Use 1 each to test sugars twice daily as directed E11.9   latanoprost  (XALATAN) 0.005 % ophthalmic solution    traZODone  (DESYREL ) 50 MG tablet TAKE 1 TABLET BY MOUTH EVERYDAY AT BEDTIME   [DISCONTINUED] insulin  isophane & regular human KwikPen (NOVOLIN  70/30 KWIKPEN) (70-30) 100 UNIT/ML KwikPen INJECT 30 UNITS INTO THE SKIN DAILY BEFORE BREAKFAST AND 30 UNITS AT BEDTIME. 15ML=25DAYS   No facility-administered encounter medications on file as of 09/07/2024.   Hearing/Vision screen Hearing Screening   500Hz  1000Hz  2000Hz  4000Hz   Right ear Pass Pass Pass Fail  Left ear Fail Pass Pass Fail   Vision Screening (Inadequate exam)  Comments: Patient is having trouble reading   Immunizations and Health Maintenance Health Maintenance  Topic Date Due   OPHTHALMOLOGY EXAM  07/13/2021   COVID-19 Vaccine (6 - 2025-26 season) 09/23/2024 (Originally 06/27/2024)   Zoster Vaccines- Shingrix (1 of 2) 12/08/2024 (Originally 06/10/1970)   Influenza Vaccine  01/24/2025 (Originally 05/27/2024)   Pneumococcal Vaccine: 50+ Years (2 of 2 - PCV) 03/23/2025 (Originally 02/20/2021)   HEMOGLOBIN A1C  03/07/2025   FOOT EXAM  03/23/2025   Diabetic kidney evaluation - Urine ACR  09/02/2025   Diabetic kidney evaluation - eGFR measurement  09/07/2025   Medicare Annual Wellness (AWV)  09/07/2025   DTaP/Tdap/Td (2 - Td or Tdap) 02/28/2034   Hepatitis C Screening  Completed   Meningococcal B Vaccine  Aged Out   Colonoscopy  Discontinued   Fecal DNA (Cologuard)  Discontinued        Assessment/Plan:  This is a routine wellness examination for [name].  Patient Care Team: [name], MD as PCP - General (Family Medicine) Grayce Zina PEAK as Oncology Nurse Navigator [place], Jon DEL, [facility] (Pharmacist)   Medicare annual wellness visit, subsequent  Malignant hypertension -Remains uncontrolled - Patient self adjusting or skipping doses of medication due to feeling well when taking meds. -previously seen by HTN clinic.  Numerous bp med tried in the past however pt continued to skip  doses and endorsed frustration with the constant med changes. -renal u/s 05/16/21 no evidence of Rental artery stenosis. - Plan: Ambulatory referral to Pulmonology, Lipid panel, TSH, T4, free, Ambulatory referral to [facility]pert[facility], Ambulatory referral to Ophthalmology, T4, free, TSH, Lipid panel  Type 2 diabetes mellitus with diabetic polyneuropathy, with long-term current use of insulin  (HCC) - Uncontrolled -Hemoglobin A1c 9.4% on 03/23/2024 -farxiga  10 mg daily - NovoLog  70/30 discontinued. - Start Tresiba 42 units once daily, NovoLog  9 units twice daily  with meals. - Continue follow-up with podiatry - Not currently on ACE or ARB.  Continue statin  - Plan: Ambulatory referral to Ophthalmology  Insomnia, unspecified type - Discussed need for sleep study - Sleep hygiene encouraged -Continue trazodone  50 mg as needed - Plan: Ambulatory referral to Pulmonology, Ambulatory referral to Advanced Hypertension Clinic  Bilateral carotid artery stenosis - Bilateral carotid ultrasound 02/23/2024 with bilateral carotid artery stenosis less than 50%. - Discussed importance of controlling cholesterol, blood pressure. - Plan: Lipid panel, Ambulatory referral to Advanced Hypertension Clinic, Lipid panel  Murmur -concern chronically uncontrolled HTN causing damage to heart HCOM, diastolic dysfunction, etc. -ECHO 08/14/22 with EF 60-65%, grade I diastolic dysfunction, LA mildly dilated, moderate mitral annular calcification.  Mild mitral valve regurg.  Moderate calcification of aortic valve.  Mild thickening of aortic valve.  Aortic valve regurg mild.  No AV stenosis noted. -referral to cards, ECHO needed - Plan: Ambulatory referral to [facility] Hypertension Clinic  Severe nonproliferative diabetic retinopathy of right eye with macular edema associated with type 2 diabetes mellitus (HCC)  -lost to f/u, referral placed. -Discussed the importance of better glycemic control. - Plan:  Ambulatory referral to Ophthalmology  History of prostate cancer  -stable.  F/u with urology if PSA elevated. - Plan: PSA, Medicare, PSA, Medicare  Hyperlipidemia -improvement in myalgias since taking Lipitor 80 mg a few times per wk -lifestyle modifications - Plan: Lipid panel  Advised of potential effects of uncontrolled chronic conditions such as HTN, DM, HLD.  Pt is not taking opioids.  I have personally reviewed and noted the following in the patient's chart:   Medical and social history Use of alcohol, tobacco or illicit drugs  Current medications and supplements including opioid prescriptions. Functional ability and status Nutritional status Physical activity Advanced directives List of other physicians Hospitalizations, surgeries, and ER visits in previous 12 months Vitals Screenings to include cognitive, depression, and falls Referrals and appointments  Orders Placed This Encounter  Procedures   Lipid panel    Standing Status:   Future    Number of Occurrences:   1    Expiration Date:   09/07/2025   TSH    Standing Status:   Future    Number of Occurrences:   1    Expiration Date:   09/07/2025   T4, free    Standing Status:   Future    Number of Occurrences:   1    Expiration Date:   09/07/2025   PSA, Medicare    Standing Status:   Future    Number of Occurrences:   1    Expiration Date:   09/07/2025   Ambulatory referral to Pulmonology    Referral Priority:   Routine    Referral Type:   Consultation    Referral Reason:   Specialty Services Required    Requested Specialty:   Pulmonary Disease    Number of Visits Requested:   1   Ambulatory referral to [facility] Hypert[facility]    Referral Priority:   Urgent    Referral Type:   Consultation    Referral Reason:   Specialty Services Required    Number of Visits Requested:   1   Ambulatory referral to Ophthalmology    Referral Priority:   Routine    Referral Type:   Consultation    Referral  Reason:   Specialty Services Required    Requested Specialty:   Ophthalmology    Number of Visits Requested:   1  In addition, I have reviewed and discussed with patient certain preventive protocols, quality metrics, and best practice recommendations. A written personalized care plan for preventive services as well as general preventive health recommendations were provided to patient.   Clotilda JONELLE Single, MD   09/07/2024   Return in about 3 months (around 12/08/2024).  After Visit Summary: (In Person-Declined) Patient declined AVS at this time.

## 2024-09-07 NOTE — Progress Notes (Signed)
 01/11/2024 Name: Cristobal Advani MRN: [id number] DOB: [birth year]  Chief Complaint  Patient presents with   Medication Management   Diabetes   Hypertension   Hyperlipidemia    Rodriques Badie is a 73 y.o. year old male who was referred for medication management by their primary care provider, [name], MD. They presented for a face to face visit today.   They were referred to the pharmacist by their PCP for assistance in managing diabetes, hypertension, and complex medication management    Subjective:  Care Team: Primary Care Provider: [name], MD  Medication Access/Adherence  Current Pharmacy:  [facility] [phone] [place], [place] - [address] [place] [place] [address] Phone: [phone] Fax: [phone]  [facility] (New Address) - [place], [place] - [address] AT Previously: [address], [place] [address] [place] [address] Phone: [phone] Fax: [phone]  [facility] - [place]  - [place], [place] - [address] [place] [address] Phone: [phone] Fax: [phone]   Patient reports affordability concerns with their medications: No  Patient reports access/transportation concerns to their pharmacy: No  Patient reports adherence concerns with their medications:  YES - STOPS MEDS ON HIS OWN DUE TO NOT FEELING GOOD when taking them all  Hypertension:  Current medications:  Carvedilol  25mg  BID (1/2 tab BID), Amlodipine  2.5mg  Medications previously tried: Diltiazem , Metoprolol , Losartan , Lisinopril , Triamterene /hydrochlorothiazide , Amlodipine   Patient has a validated, automated, upper arm home BP cuff Current blood pressure readings: 184/104 HR 76  Did not bring BP log or machine to appt as requested, reports seeing elevated readings at home as well. Been skipping carvedilol  doses  due to feeling dizzy  Diabetes:  Current medications: Novolin  70/30 BID 30 units in morning, 30 units at night, Farxiga  10mg  Medications tried in the past: None   Current glucose readings:  Has not been wearing sensor, none to report. Thinks he may have had some low BG last week  Sugars still elevated but have improved since increasing insulin  dose 2 weeks ago. Did have 1 low in upper 50s-60s last night, reports he may have waited too long to eat after injecting insulin   Observed patterns:  Patient denies hypoglycemic s/sx including dizziness, shakiness, sweating. Patient denies hyperglycemic symptoms including polyuria, polydipsia, polyphagia, nocturia, neuropathy, blurred vision.  Hyperlipidemia/ASCVD Risk Reduction  Current lipid lowering medications: Lipitor 80mg  Medications tried in the past: None  Admits he has been skipping liptor a lot due to feeling it was causing leg cramps the last 2 months, discussing with PCP today  Insomnia -Taking trazodone  50mg  prn sleep and reports this is working well  Objective:  Lab Results  Component Value Date   HGBA1C 8.4 (H) 09/07/2024    Lab Results  Component Value Date   CREATININE 2.42 (H) 09/07/2024   BUN 34 (H) 09/07/2024   NA 142 09/07/2024   K 4.7 09/07/2024   CL 105 09/07/2024   CO2 27 09/07/2024    Lab Results  Component Value Date   CHOL 153 03/23/2024   HDL 50.30 03/23/2024   LDLCALC 86 03/23/2024   LDLDIRECT 98 09/04/2022   TRIG 83.0 03/23/2024   CHOLHDL 3 03/23/2024    Medications Reviewed Today   Medications were not reviewed in this encounter       Assessment/Plan:    Hypertension: - Currently uncontrolled - Reviewed long term cardiovascular and renal outcomes  of uncontrolled blood pressure - Reviewed appropriate blood pressure monitoring technique and reviewed goal blood pressure. Recommended to check home blood pressure and heart rate daily -check twice daily and bring readings to  appt -Continue medication therapy for now, sees PCP right after that visit, they will discuss potential therapy changes such as titrating down carvedilol  ,increasing amlodpine, and possible referral to HTN clinic  Diabetes: - Currently uncontrolled - Reviewed long term cardiovascular and renal outcomes of uncontrolled blood sugar - Reviewed goal A1c, goal fasting, and goal 2 hour post prandial glucose - Reviewed dietary modifications including low carb diet - Patient denies personal or family history of multiple endocrine neoplasia type 2, medullary thyroid  cancer; personal history of pancreatitis or gallbladder disease. - Recommend to check glucose once daily -START Tresiba 42 units once daily, Novolog  9 units BID with meals. STOP Novolog  Mix 70/30  Hyperlipidemia/ASCVD Risk Reduction: - Currently uncontrolled.  - Reviewed long term complications of uncontrolled cholesterol - Recommend to obtain updated lipid panel. Discuss potential statin dose reduction to address cramps with PCP today      Insomnia -Continue trazodone  50mg  nightly   Follow Up Plan: 1 week  Jon VEAR [place], PharmD Clinical Pharmacist [phone]

## 2024-09-14 ENCOUNTER — Ambulatory Visit

## 2024-09-19 ENCOUNTER — Ambulatory Visit

## 2024-09-19 DIAGNOSIS — E782 Mixed hyperlipidemia: Secondary | ICD-10-CM

## 2024-09-19 DIAGNOSIS — I10 Essential (primary) hypertension: Secondary | ICD-10-CM

## 2024-09-19 DIAGNOSIS — E1142 Type 2 diabetes mellitus with diabetic polyneuropathy: Secondary | ICD-10-CM

## 2024-09-19 NOTE — Progress Notes (Signed)
 Name: [name] MRN: [id number] DOB: 1950/11/05  Chief Complaint  Patient presents with   Medication Management   Diabetes   Hypertension   Hyperlipidemia    Aldine Grainger is a 73 y.o. year old male who was referred for medication management by their primary care provider, [name], MD. They presented for a face to face visit today.   They were referred to the pharmacist by their PCP for assistance in managing diabetes, hypertension, and complex medication management    Subjective:  Care Team: Primary Care Provider: [name], MD  Medication Access/Adherence  [facility]  [phone] [place] [place] - [address] [place] [place] [address] Phone: [phone] Fax: [phone]  [facility] (New Address) - [place], [place] - [address] [facility] AT Previously: Viviana Mulligan, [place] [address] [facility] [address] 4th [address] [place] [address] Phone: [phone] Fax: [phone]  [facility] - [place]  - [place], [place] - [address] [place] [address] Phone: [phone] Fax: [phone]   Patient reports affordability concerns with their medications: No  Patient reports access/transportation concerns to their pharmacy: No  Patient reports adherence concerns with their medications:  YES - STOPS MEDS ON HIS OWN DUE TO NOT FEELING GOOD when taking them all  Hypertension:  Current medications:  Carvedilol  25mg  BID (1/2 tab BID), Amlodipine  2.5mg  Medications previously tried: Diltiazem , Metoprolol , Losartan , Lisinopril , Triamterene /hydrochlorothiazide , Amlodipine   Patient has a validated, automated, upper arm home BP cuff Current blood pressure readings: 170/94, does not come down upon recheck. Patient DECLINES to go to ER  Reviewed home BP machines and those have been staying elevated as well. Been skipping  carvedilol  doses due to feeling dizzy  Diabetes:  Current medications: Tresiba 42 units daily, Novolog  9 units BID with food, Farxiga  10mg  Medications tried in the past: None   Current glucose readings:    Observed patterns:  Have some lows overnight  Patient denies hypoglycemic s/sx including dizziness, shakiness, sweating. Patient denies hyperglycemic symptoms including polyuria, polydipsia, polyphagia, nocturia, neuropathy, blurred vision.  Hyperlipidemia/ASCVD Risk Reduction  Current lipid lowering medications: Lipitor 80mg  Medications tried in the past: None  Has dropped down to three times per week due to leg cramps, reports this has resolved the issue  Insomnia -Taking trazodone  50mg  prn sleep, reports this is not working well  Objective:  Lab Results  Component Value Date   HGBA1C 8.4 (H) 09/07/2024    Lab Results  Component Value Date   CREATININE 2.42 (H) 09/07/2024   BUN 34 (H) 09/07/2024   NA 142 09/07/2024   K 4.7 09/07/2024   CL 105 09/07/2024   CO2 27 09/07/2024    Lab Results  Component Value Date   CHOL 151 09/07/2024   HDL 43.40 09/07/2024   LDLCALC 86 09/07/2024   LDLDIRECT 98 09/04/2022   TRIG 110.0 09/07/2024   CHOLHDL 3 09/07/2024    Medications Reviewed Today     Reviewed by Lionell Jon DEL, RPH (Pharmacist) on 09/19/24 at 1403  Med List Status: <None>   Medication Order Taking? Sig Documenting Provider Last Dose Status Informant  amLODipine  (NORVASC ) 2.5 MG tablet [id number] Yes Take 1 tablet (2.5 mg total) by mouth daily. [name], MD  Active   ASPIRIN  LOW DOSE 81 MG tablet [id number] Yes TAKE 1 TABLET (81 MG TOTAL) BY MOUTH DAILY. SWALLOW WHOLE. [name], MD  Active  atorvastatin  (LIPITOR) 80 MG tablet [id number] Yes TAKE 1 TABLET BY MOUTH EVERY DAY  Patient taking differently: Take 80 mg by mouth daily. Taking TIW   [name], MD  Active   BD PEN NEEDLE NANO 2ND GEN 32G X 4 MM MISC [id number]  TO USE WITH  INSULIN  PEN TWICE A DAY. [name], MD  Active   blood glucose meter kit and supplies [id number]  Dispense based on patient and insurance preference. Use to check glucose daily (FOR ICD-10 E10.9, E11.9). Alvia Bring, DO  Active   Blood Glucose Monitoring Suppl (ONE TOUCH ULTRA 2) w/Device KIT [id number]  Use to check blood sugars twice daily as directed. Dx E11.9 [name], MD  Active   carvedilol  (COREG ) 25 MG tablet [id number] Yes TAKE 1 TABLET BY MOUTH TWICE A DAY  Patient taking differently: Take 25 mg by mouth 2 (two) times daily. 1/2 TAB BID   [name], MD  Active   cholecalciferol (VITAMIN D3) 25 MCG (1000 UNIT) tablet [id number]  Take 1,000 Units by mouth daily. [provider]  Active   Continuous Glucose Sensor (DEXCOM G7 SENSOR) MISC [id number] Yes Use to monitor blood sugar continuously. Change sensor every 10 days as directed Dx E11.65 [name], MD  Active   dapagliflozin  propanediol (FARXIGA ) 10 MG TABS tablet [id number] Yes Take 10 mg by mouth daily. [provider]  Active   Ferrous Sulfate Dried (FERROUS SULFATE CR PO) [id number] Yes Take 325 mg by mouth daily at 2 PM. [provider]  Active   glucose blood (ONETOUCH ULTRA TEST) test strip [id number]  Use to check blood sugar twice daily as directed E11.9 [name], MD  Active   insulin  aspart (NOVOLOG  FLEXPEN) 100 UNIT/ML FlexPen [id number] Yes Inject 9 Units into the skin 2 (two) times daily with a meal. Through NOVO PAP [provider]  Active   insulin  degludec (TRESIBA FLEXTOUCH) 100 UNIT/ML FlexTouch Pen [id number] Yes Inject 42 Units into the skin daily. Getting through NOVO PAP [provider]  Active   Lancet Devices (ONE TOUCH DELICA LANCING DEV) MISC [id number]  1 Device by Does not apply route as directed. Shamleffer, Donell Cardinal, MD  Active   Lancets [employer] CATHRYNE PLUS [place]) MISC [id number]  Use 1 each to test sugars twice daily as  directed E11.9 [name], MD  Active   latanoprost (XALATAN) 0.005 % ophthalmic solution [id number]   [provider]  Active   traZODone  (DESYREL ) 50 MG tablet [id number] Yes TAKE 1 TABLET BY MOUTH EVERYDAY AT BEDTIME  Patient taking differently: Take 50 mg by mouth at bedtime as needed for sleep.   [name], MD  Active               Assessment/Plan:    Hypertension: - Currently uncontrolled - Reviewed long term cardiovascular and renal outcomes of uncontrolled blood pressure - Reviewed appropriate blood pressure monitoring technique and reviewed goal blood pressure. Recommended to check home blood pressure and heart rate daily -check twice daily and bring readings to appt -Has appt with HTN clinic in Jan -Discussed increasing amlodipine  or possible addition of spironolactone, patient declines any med changes. Wants to bring in some meds that he has been skipping because he is unsure what they are  Diabetes: - Currently uncontrolled but improving - Reviewed long term cardiovascular and renal outcomes of uncontrolled blood sugar - Reviewed goal A1c, goal fasting, and goal 2 hour post prandial  glucose - Reviewed dietary modifications including low carb diet - Patient denies personal or family history of multiple endocrine neoplasia type 2, medullary thyroid  cancer; personal history of pancreatitis or gallbladder disease. - Recommend to check glucose once daily -DECREASE tresiba to 40 units daily, continue novolog  9 units BID and farxiga  10mg  daily  Hyperlipidemia/ASCVD Risk Reduction: - Currently uncontrolled.  - Reviewed long term complications of uncontrolled cholesterol - Recommend to obtain updated lipid panel at next PCP visit, consider addition of zetia of LDL not controlled      Insomnia -Take trazodone  nightly as prescribed  Follow Up Plan: 1 week  Jon VEAR Lindau, PharmD Clinical Pharmacist [phone]

## 2024-09-26 ENCOUNTER — Ambulatory Visit

## 2024-09-26 DIAGNOSIS — I10 Essential (primary) hypertension: Secondary | ICD-10-CM

## 2024-09-26 MED ORDER — SPIRONOLACTONE 25 MG PO TABS: 12.5000 mg | ORAL_TABLET | Freq: Every day | ORAL | 0 refills | Status: DC

## 2024-09-26 NOTE — Progress Notes (Signed)
 Name: [name] MRN: [id number] DOB: 04/06/1951  Chief Complaint  Patient presents with   Medication Management   Diabetes   Hypertension    [name] is a 73 y.o. year old male who was referred for medication management by their primary care provider, [name] Clotilda SAUNDERS, MD. They presented for a face to face visit today.   They were referred to the pharmacist by their PCP for assistance in managing diabetes, hypertension, and complex medication management    Subjective:  Care Team: Primary Care Provider: [name], MD  Medication Access/Adherence  [facility]  [phone] [place], [place] - [address] [facility] [address] [place] [address] Phone: [phone] Fax: [phone]  [facility] (New Address) - [place], [place] - [address] [facility] AT Previously: Viviana [place], [place] [address] [facility] [address] 2 4th [address] [place] [address] Phone: [phone] Fax: [phone]  [facility] - [place]  - [place], [place] - [address] [place] [address] Phone: [phone] Fax: [phone]   Patient reports affordability concerns with their medications: No  Patient reports access/transportation concerns to their pharmacy: No  Patient reports adherence concerns with their medications:  YES - STOPS MEDS ON HIS OWN DUE TO NOT FEELING GOOD when taking them all  Hypertension:  Current medications:  Carvedilol  25mg  BID (1/2 tab BID), Amlodipine  2.5mg  Medications previously tried: Diltiazem , Metoprolol , Losartan , Lisinopril , Triamterene /hydrochlorothiazide , Amlodipine   Patient has a validated, automated, upper arm home BP cuff Current blood pressure readings: 172, 98 HR 82, does not come down upon recheck   Elevated readings at home as well  Denies any signs or symptoms of high BP  Diabetes:  Current medications: Tresiba 40  units daily, Novolog  9 units BID with food, Farxiga  10mg  Medications tried in the past: None   Current glucose readings:    Observed patterns:  Have some lows overnight  Patient denies hypoglycemic s/sx including dizziness, shakiness, sweating. Patient denies hyperglycemic symptoms including polyuria, polydipsia, polyphagia, nocturia, neuropathy, blurred vision.  Hyperlipidemia/ASCVD Risk Reduction  Current lipid lowering medications: Lipitor 80mg  Medications tried in the past: None  Has dropped down to three times per week due to leg cramps, reports this has resolved the issue  Insomnia -Taking trazodone  50mg  prn sleep, reports this is not working well  Objective:  Lab Results  Component Value Date   HGBA1C 8.4 (H) 09/07/2024    Lab Results  Component Value Date   CREATININE 2.42 (H) 09/07/2024   BUN 34 (H) 09/07/2024   NA 142 09/07/2024   K 4.7 09/07/2024   CL 105 09/07/2024   CO2 27 09/07/2024    Lab Results  Component Value Date   CHOL 151 09/07/2024   HDL 43.40 09/07/2024   LDLCALC 86 09/07/2024   LDLDIRECT 98 09/04/2022   TRIG 110.0 09/07/2024   CHOLHDL 3 09/07/2024    Medications Reviewed Today     Reviewed by [name], RPH (Pharmacist) on 09/26/24 at 1422  Med List Status: <None>   Medication Order Taking? Sig Documenting Provider Last Dose Status Informant  amLODipine  (NORVASC ) 2.5 MG tablet [id number]  Take 1 tablet (2.5 mg total) by mouth daily. [name] Clotilda SAUNDERS, MD  Active   ASPIRIN  LOW DOSE 81 MG tablet [id number]  TAKE 1 TABLET (81 MG TOTAL) BY MOUTH DAILY. SWALLOW WHOLE. [name] Clotilda SAUNDERS, MD  Active   atorvastatin  (LIPITOR) 80 MG tablet [id number]  TAKE  1 TABLET BY MOUTH EVERY DAY  Patient taking differently: Take 80 mg by mouth daily. Taking TIW   [name] Clotilda SAUNDERS, MD  Active   BD PEN NEEDLE NANO 2ND GEN 32G X 4 MM MISC [id number]  TO USE WITH INSULIN  PEN TWICE A DAY. [name] Clotilda SAUNDERS, MD  Active   blood glucose meter kit and supplies  [id number]  Dispense based on patient and insurance preference. Use to check glucose daily (FOR ICD-10 E10.9, E11.9). [name] Bring, DO  Active   Blood Glucose Monitoring Suppl (ONE TOUCH ULTRA 2) w/Device KIT [id number]  Use to check blood sugars twice daily as directed. Dx E11.9 [name] Clotilda SAUNDERS, MD  Active   carvedilol  (COREG ) 25 MG tablet [id number]  TAKE 1 TABLET BY MOUTH TWICE A DAY  Patient taking differently: Take 25 mg by mouth 2 (two) times daily. 1/2 TAB BID   [name] Clotilda SAUNDERS, MD  Active   cholecalciferol (VITAMIN D3) 25 MCG (1000 UNIT) tablet [id number]  Take 1,000 Units by mouth daily. [provider]  Active   Continuous Glucose Sensor (DEXCOM G7 SENSOR) [place] [id number]  Use to monitor blood sugar continuously. Change sensor every 10 days as directed Dx E11.65 [name] Clotilda SAUNDERS, MD  Active   dapagliflozin  propanediol (FARXIGA ) 10 MG TABS tablet [id number]  Take 10 mg by mouth daily. [provider]  Active   Ferrous Sulfate Dried (FERROUS SULFATE CR PO) [id number]  Take 325 mg by mouth daily at 2 PM. [provider]  Active   glucose blood (ONETOUCH ULTRA TEST) test strip [id number]  Use to check blood sugar twice daily as directed E11.9 [name] Clotilda SAUNDERS, MD  Active   insulin  aspart (NOVOLOG  FLEXPEN) 100 UNIT/ML FlexPen [id number]  Inject 9 Units into the skin 2 (two) times daily with a meal. Through NOVO PAP [provider]  Active   insulin  degludec (TRESIBA FLEXTOUCH) 100 UNIT/ML FlexTouch Pen [id number]  Inject 42 Units into the skin daily. Getting through NOVO PAP [provider]  Active   Lancet Devices (ONE TOUCH DELICA LANCING DEV) MISC [id number]  1 Device by Does not apply route as directed. [name], [name] Cardinal, MD  Active   Lancets [facility] [name]) MISC [id number]  Use 1 each to test sugars twice daily as directed E11.9 [name] Clotilda SAUNDERS, MD  Active   latanoprost (XALATAN) 0.005 % ophthalmic solution [id number]   [provider]  Active   spironolactone (ALDACTONE) 25 MG tablet [id number] Yes Take 0.5 tablets (12.5 mg total) by mouth daily. [name] Clotilda SAUNDERS, MD  Active   traZODone  (DESYREL ) 50 MG tablet [id number]  TAKE 1 TABLET BY MOUTH EVERYDAY AT BEDTIME  Patient taking differently: Take 50 mg by mouth at bedtime as needed for sleep.   [name] Clotilda SAUNDERS, MD  Active               Assessment/Plan:    Hypertension: - Currently uncontrolled - Reviewed long term cardiovascular and renal outcomes of uncontrolled blood pressure - Reviewed appropriate blood pressure monitoring technique and reviewed goal blood pressure. Recommended to check home blood pressure and heart rate daily -check twice daily and [name] readings to appt -Has appt with HTN clinic in Jan -START spironolactone 25mg  1/2 tab daily, ordered CMP for potassium check  Diabetes: - Currently uncontrolled but improving - Reviewed long term cardiovascular and renal outcomes of uncontrolled blood sugar - Reviewed goal A1c, goal fasting, and goal 2 hour post prandial glucose - Reviewed dietary  modifications including low carb diet - Patient denies personal or family history of multiple endocrine neoplasia type 2, medullary thyroid  cancer; personal history of pancreatitis or gallbladder disease. - Recommend to check glucose once daily -DECREASE Novolog  to 6 units BID with meals to address lows associated after injections  Hyperlipidemia/ASCVD Risk Reduction: - Currently uncontrolled.  - Reviewed long term complications of uncontrolled cholesterol - Recommend to obtain updated lipid panel at next PCP visit, consider addition of zetia of LDL not controlled      Insomnia -Take trazodone  nightly as prescribed  Follow Up Plan: 2 weeks  Jon VEAR Lindau, PharmD Clinical Pharmacist [phone]

## 2024-10-12 ENCOUNTER — Other Ambulatory Visit

## 2024-10-12 ENCOUNTER — Ambulatory Visit: Payer: Self-pay | Admitting: Family Medicine

## 2024-10-12 ENCOUNTER — Ambulatory Visit

## 2024-10-12 DIAGNOSIS — I10 Essential (primary) hypertension: Secondary | ICD-10-CM | POA: Diagnosis not present

## 2024-10-12 LAB — COMPREHENSIVE METABOLIC PANEL WITH GFR
ALT: 10 U/L (ref 3–53)
AST: 16 U/L (ref 5–37)
Albumin: 4 g/dL (ref 3.5–5.2)
Alkaline Phosphatase: 94 U/L (ref 39–117)
BUN: 46 mg/dL — ABNORMAL HIGH (ref 6–23)
CO2: 28 meq/L (ref 19–32)
Calcium: 9.3 mg/dL (ref 8.4–10.5)
Chloride: 107 meq/L (ref 96–112)
Creatinine, Ser: 2.8 mg/dL — ABNORMAL HIGH (ref 0.40–1.50)
GFR: 21.88 mL/min — ABNORMAL LOW (ref >60.00)
Glucose, Bld: 73 mg/dL (ref 70–99)
Potassium: 4.1 meq/L (ref 3.5–5.1)
Sodium: 142 meq/L (ref 135–145)
Total Bilirubin: 0.3 mg/dL (ref 0.2–1.2)
Total Protein: 7.1 g/dL (ref 6.0–8.3)

## 2024-10-12 NOTE — Progress Notes (Signed)
 Name: [name] MRN: [id number] DOB: [birth year]  Chief Complaint  Patient presents with   Medication Management   Hypertension   Diabetes    [name] is a 73 y.o. year old male who was referred for medication management by their primary care provider, [name], MD. They presented for a face to face visit today.   They were referred to the pharmacist by their PCP for assistance in managing diabetes, hypertension, and complex medication management    Subjective:  Care Team: Primary Care Provider: [name], MD  Medication Access/Adherence  [facility]  [phone] [place], [place] - [address] [place] [address] Phone: [phone] Fax: [phone]  [facility] (New Address) - [place], [place] - [address] [facility] AT Previously: Viviana [place], [place] [address] [facility] [address] 4th [address] [place] [address] Phone: [phone] Fax: [phone]  [facility] - [place]  - [place], [place] - [address] [place] [address] Phone: [phone] Fax: [phone]   Patient reports affordability concerns with their medications: No  Patient reports access/transportation concerns to their pharmacy: No  Patient reports adherence concerns with their medications:  YES - STOPS MEDS ON HIS OWN DUE TO NOT FEELING GOOD when taking them all  Hypertension:  Current medications:  Carvedilol  25mg  BID (1/2 tab BID), Amlodipine  2.5mg , Spironolactone  25mg  1/2 tab daily (pt self increased to 1 tab daily) Medications previously tried: Diltiazem , Metoprolol , Losartan , Lisinopril , Triamterene /hydrochlorothiazide , Amlodipine   Patient has a validated, automated, upper arm home BP cuff Current blood pressure readings: 172, 92 HR 82, does not come down upon recheck, declines to go to ER for elevated BP. Has not taken BP meds  today, did not have time this morning he reports   Elevated readings at home as well but has seen some improvement, had a 122/78 yesterday evening  Denies any signs or symptoms of high BP  Diabetes:  Current medications: Tresiba 40 units daily, Novolog  9 units BID with food, Farxiga  10mg  Medications tried in the past: None   Current glucose readings:    Observed patterns:  Have some lows overnight and early morning, including a 47  Patient denies hypoglycemic s/sx including dizziness, shakiness, sweating. Patient denies hyperglycemic symptoms including polyuria, polydipsia, polyphagia, nocturia, neuropathy, blurred vision.  Hyperlipidemia/ASCVD Risk Reduction  Current lipid lowering medications: Lipitor 80mg  Medications tried in the past: None  Has dropped down to three times per week due to leg cramps, reports this has resolved the issue  Insomnia -Taking trazodone  50mg  prn sleep, reports this is not working well  Objective:  Lab Results  Component Value Date   HGBA1C 8.4 (H) 09/07/2024    Lab Results  Component Value Date   CREATININE 2.42 (H) 09/07/2024   BUN 34 (H) 09/07/2024   NA 142 09/07/2024   K 4.7 09/07/2024   CL 105 09/07/2024   CO2 27 09/07/2024    Lab Results  Component Value Date   CHOL 151 09/07/2024   HDL 43.40 09/07/2024   LDLCALC 86 09/07/2024   LDLDIRECT 98 09/04/2022   TRIG 110.0 09/07/2024   CHOLHDL 3 09/07/2024    Medications Reviewed Today     Reviewed by Lionell Jon DEL, RPH (Pharmacist) on 10/12/24 at 1236  Med List Status: <None>   Medication Order Taking? Sig Documenting Provider Last Dose Status Informant  amLODipine  (NORVASC ) 2.5 MG tablet [id number] Yes Take 1  tablet (2.5 mg total) by mouth daily. [name], MD  Active   ASPIRIN  LOW DOSE 81 MG tablet [id number] Yes TAKE 1 TABLET (81 MG TOTAL) BY MOUTH DAILY. SWALLOW WHOLE. [name], MD  Active   atorvastatin  (LIPITOR) 80 MG tablet [id number]  TAKE 1 TABLET BY  MOUTH EVERY DAY  Patient taking differently: Take 80 mg by mouth daily. Taking TIW   [name], MD  Active   BD PEN NEEDLE NANO 2ND GEN 32G X 4 MM MISC [id number]  TO USE WITH INSULIN  PEN TWICE A DAY. [name], MD  Active   blood glucose meter kit and supplies [id number]  Dispense based on patient and insurance preference. Use to check glucose daily (FOR ICD-10 E10.9, E11.9). Alvia Bring, DO  Active   Blood Glucose Monitoring Suppl (ONE TOUCH ULTRA 2) w/Device KIT [id number]  Use to check blood sugars twice daily as directed. Dx E11.9 [name], MD  Active   carvedilol  (COREG ) 25 MG tablet [id number]  TAKE 1 TABLET BY MOUTH TWICE A DAY  Patient taking differently: Take 25 mg by mouth 2 (two) times daily. 1/2 TAB BID   [name], MD  Active   cholecalciferol (VITAMIN D3) 25 MCG (1000 UNIT) tablet [id number]  Take 1,000 Units by mouth daily. [provider]  Active   Continuous Glucose Sensor (DEXCOM G7 SENSOR) MISC [id number] Yes Use to monitor blood sugar continuously. Change sensor every 10 days as directed Dx E11.65 [name], MD  Active   dapagliflozin  propanediol (FARXIGA ) 10 MG TABS tablet [id number] Yes Take 10 mg by mouth daily. [provider]  Active   Ferrous Sulfate Dried (FERROUS SULFATE CR PO) [id number] Yes Take 325 mg by mouth daily at 2 PM. [provider]  Active   glucose blood (ONETOUCH ULTRA TEST) test strip [id number]  Use to check blood sugar twice daily as directed E11.9 [name], MD  Active   insulin  aspart (NOVOLOG  FLEXPEN) 100 UNIT/ML FlexPen [id number] Yes Inject 9 Units into the skin 2 (two) times daily with a meal. Through NOVO PAP  Patient taking differently: Inject 6 Units into the skin 2 (two) times daily with a meal. Through NOVO PAP   [provider]  Active   insulin  degludec (TRESIBA FLEXTOUCH) 100 UNIT/ML FlexTouch Pen [id number] Yes Inject 42 Units into the skin daily. Getting through NOVO  PAP  Patient taking differently: Inject 38 Units into the skin daily. Getting through NOVO PAP   [provider]  Active   Lancet Devices (ONE TOUCH DELICA LANCING DEV) MISC [id number]  1 Device by Does not apply route as directed. Shamleffer, Donell Cardinal, MD  Active   Lancets [facility] CATHRYNE PLUS [place]) MISC [id number]  Use 1 each to test sugars twice daily as directed E11.9 [name], MD  Active   latanoprost (XALATAN) 0.005 % ophthalmic solution [id number]   [provider]  Active   spironolactone  (ALDACTONE ) 25 MG tablet [id number] Yes Take 0.5 tablets (12.5 mg total) by mouth daily.  Patient taking differently: Take 12.5 mg by mouth daily. Pt self increased to 1 tab daily   [name], MD  Active   traZODone  (DESYREL ) 50 MG tablet [id number]  TAKE 1 TABLET BY MOUTH EVERYDAY AT BEDTIME  Patient taking differently: Take 50 mg by mouth at bedtime as needed for sleep.   [name], MD  Active  Assessment/Plan:    Hypertension: - Currently uncontrolled - Reviewed long term cardiovascular and renal outcomes of uncontrolled blood pressure - Reviewed appropriate blood pressure monitoring technique and reviewed goal blood pressure. Recommended to check home blood pressure and heart rate daily -check twice daily and bring readings to appt -Has appt with HTN clinic in Jan -Continue current med therapy, CMP obtained today, results pending  Diabetes: - Currently uncontrolled but improving - Reviewed long term cardiovascular and renal outcomes of uncontrolled blood sugar - Reviewed goal A1c, goal fasting, and goal 2 hour post prandial glucose - Reviewed dietary modifications including low carb diet - Patient denies personal or family history of multiple endocrine neoplasia type 2, medullary thyroid  cancer; personal history of pancreatitis or gallbladder disease. - Recommend to check glucose once daily -DECREASE Tresiba to 38 units  once daily to address recurrent daily lows in the morning. Continue novolog  6 units BID, farxiga  10mg  daily.  -Counseled on importance of routine with meals and not going extended periods of time without eating in diabetic patients to avoid low BG  Hyperlipidemia/ASCVD Risk Reduction: - Currently uncontrolled.  - Reviewed long term complications of uncontrolled cholesterol - Recommend to obtain updated lipid panel at next PCP visit, consider addition of zetia of LDL not controlled   Insomnia -Take trazodone  nightly as prescribed  Follow Up Plan: 2 weeks  Jon VEAR Lindau, PharmD Clinical Pharmacist [phone]

## 2024-10-19 ENCOUNTER — Ambulatory Visit (HOSPITAL_BASED_OUTPATIENT_CLINIC_OR_DEPARTMENT_OTHER): Admitting: Family

## 2024-10-19 ENCOUNTER — Encounter (HOSPITAL_BASED_OUTPATIENT_CLINIC_OR_DEPARTMENT_OTHER): Payer: Self-pay | Admitting: Family

## 2024-10-19 VITALS — BP 128/78 | HR 80 | Ht 66.0 in | Wt 157.6 lb

## 2024-10-19 DIAGNOSIS — E782 Mixed hyperlipidemia: Secondary | ICD-10-CM | POA: Diagnosis not present

## 2024-10-19 DIAGNOSIS — I10 Essential (primary) hypertension: Secondary | ICD-10-CM

## 2024-10-19 MED ORDER — ASPIRIN 81 MG PO TBEC: 81.0000 mg | DELAYED_RELEASE_TABLET | Freq: Every day | ORAL | 3 refills | Status: AC

## 2024-10-19 MED ORDER — CARVEDILOL 25 MG PO TABS: 12.5000 mg | ORAL_TABLET | Freq: Two times a day (BID) | ORAL | 1 refills | Status: AC

## 2024-10-19 MED ORDER — SPIRONOLACTONE 25 MG PO TABS: 25.0000 mg | ORAL_TABLET | Freq: Every day | ORAL | 1 refills | Status: AC

## 2024-10-19 NOTE — Progress Notes (Signed)
 "  [facility] Initial Assessment:    Date:  10/19/2024   ID:  [name], DOB 1951-02-19, MRN [id number]  PCP:  [name], MD  Cardiologist:  None  Nephrologist:  Referring MD: [name], MD   CC: Hypertension  History of Present Illness:    [name] is a 73 y.o. male with a hx of DM2, HLD, HTN, CKD here to follow up in the [facility].   [name] was diagnosed with hypertension 4-5 years ago. It has been difficult to control. Prior renal duplex 04/2021 with no stenosis. Blood pressure checked with arm cuff at home with readings have been 180s. Home BP cuff has been checked for accuracy.  No alcohol nor tobacco use.. he eats at home and outside of the home. 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 Follows with nephrology and is concerned regarding his renal function and blood pressure. Previous notes from primary care state he often does not give medications much of a chance prior to stopping or requesting change.   Established with advanced hypertension clinic 07/03/22. He was enrolled in remote patient monitoring. Metoprolol  increased and later transitioned to Coreg  25mg  BID. Hydralazine  increased to 100mg  TID. Diltiazem  later transitioned to Amlodipine . Carotid duplex 08/14/22 bilateral 1-39% stenosis. Echo 08/14/22 normal LVEF 60-65%, mild AI with aortic valve sclerosis but no stenosis, gr1DD, RV normal, LA mildly dilated, mild MR. Labs for secondary hypertension (renin aldosterone, catecholamines, metanephrines, cortisol) were normal. Doxazosin  later added and dose increased.   Seen by Dr. Raford 11/03/22 with BP better controlled on home remote patient monitoring and elevated in clinic. Doxazosin  increased to 8mg  QD.   He  saw primary care 11/26/22 and was concerned his dizziness was caused by Hydralazine  100mg  TID - he was transitioned to 50mg  TID. Also noted that anemia may be contributory. Carvedilol  25mg  BID transitioned to Coreg  CR 80mg  QD (equivalent dose) to help consolidate medications.   At ADV HTN clinic visit 12/04/22 Hydralazine  transitioned to BID as he was not able to remember afternoon dosing. He still noted some lightheadedness with taking majority of antihypertensive agents in the morning, Carvedilol  CR and Doxazosin  were moved to evening dosing.   At visit with [facility] 12/2022  had previously declined renal denervation and sleep study. Average SBP at home 139. Home BP cuff found to be accurate. He was advised to continue Carvedilol  CR 80mg  QD, Amlodipine  10mg  QD, Hydralazine  50mg  BID (lightheaded on higher doses), Doxazosin  8mg  QD  He has been lost to follow-up with [facility] since that time.  Was rereferred by his primary care provider.  He presents today for follow up independently. Notes more difficulties with low blood sugars in the 40-50s in the morning 4-5 times per week. Per his report some improvement since reduction in insulin  dose by primary care team but still persistent.  Reports BP at home most often 120-130s.  He will have a rare reading once per week that is elevated.  H predominantly at home.  He will at least once per week eat a frozen meal and discussed this may correlate with his periodic elevated blood pressures.  No chest pain, exertional dyspnea, edema. He brings all his pill bottles today for review.   Previous antihypertensives: Triamterene -HCTZ -  discontinued due to renal function Diltiazem  - switched to Amlodipine  Metoprolol  - switched to carvedilol  Carvedilol  - switched to Carvedilol  CR  Past Medical History:  Diagnosis Date   CKD (chronic kidney disease)    Diabetes mellitus without complication (HCC)    Hyperlipidemia     Hypertension    Insomnia    Prostate cancer (HCC)     Past Surgical History:  Procedure Laterality Date   PROSTATE BIOPSY      Current Medications: Current Meds  Medication Sig   amLODipine  (NORVASC ) 2.5 MG tablet Take 1 tablet (2.5 mg total) by mouth daily.   ASPIRIN  LOW DOSE 81 MG tablet TAKE 1 TABLET (81 MG TOTAL) BY MOUTH DAILY. SWALLOW WHOLE. (Patient taking differently: Take 81 mg by mouth once. SWALLOW WHOLE.)   atorvastatin  (LIPITOR) 80 MG tablet TAKE 1 TABLET BY MOUTH EVERY DAY   BD PEN NEEDLE NANO 2ND GEN 32G X 4 MM MISC TO USE WITH INSULIN  PEN TWICE A DAY.   blood glucose meter kit and supplies Dispense based on patient and insurance preference. Use to check glucose daily (FOR ICD-10 E10.9, E11.9).   Blood Glucose Monitoring Suppl (ONE TOUCH ULTRA 2) w/Device KIT Use to check blood sugars twice daily as directed. Dx E11.9   carvedilol  (COREG ) 25 MG tablet TAKE 1 TABLET BY MOUTH TWICE A DAY (Patient taking differently: Take 25 mg by mouth 2 (two) times daily. 1/2 TAB BID)   cholecalciferol (VITAMIN D3) 25 MCG (1000 UNIT) tablet Take 1,000 Units by mouth daily.   Continuous Glucose Sensor (DEXCOM G7 SENSOR) MISC Use to monitor blood sugar continuously. Change sensor every 10 days as directed Dx E11.65   dapagliflozin  propanediol (FARXIGA ) 10 MG TABS tablet Take 10 mg by mouth daily.   Ferrous Sulfate Dried (FERROUS SULFATE CR PO) Take 325 mg by mouth daily at 2 PM.   glucose blood (ONETOUCH ULTRA TEST) test strip Use to check blood sugar twice daily as directed E11.9   insulin  aspart (NOVOLOG  FLEXPEN) 100 UNIT/ML FlexPen Inject 9 Units into the skin 2 (two) times daily with a meal. Through NOVO PAP (Patient taking differently: Inject 6 Units into the skin 2 (two) times daily with a meal. Through NOVO PAP)   insulin  degludec (TRESIBA FLEXTOUCH) 100 UNIT/ML FlexTouch Pen Inject 42 Units into the skin daily. Getting through NOVO PAP (Patient taking differently: Inject 38 Units into  the skin daily. Getting through NOVO PAP)   Lancet Devices (ONE TOUCH DELICA LANCING DEV) MISC 1 Device by Does not apply route as directed.   Lancets (ONETOUCH DELICA PLUS LANCET30G) MISC Use 1 each to test sugars twice daily as directed E11.9   spironolactone  (ALDACTONE ) 25 MG tablet Take 0.5 tablets (12.5 mg total) by mouth daily. (Patient taking differently: Take 12.5 mg by mouth daily. Pt self increased to 1 tab daily)   traZODone  (DESYREL ) 50 MG tablet TAKE 1 TABLET BY MOUTH EVERYDAY AT BEDTIME (Patient taking differently: Take 50 mg by mouth at bedtime as needed for sleep.)     Allergies:   Patient has no known allergies.   Social History   Socioeconomic History   Marital status: Single    Spouse name: Not on file   Number of children: 2   Years of education: Not on file   Highest education level: Not on file  Occupational History    Comment: retired  Tobacco Use   Smoking status: Never   Smokeless tobacco: Never  Vaping Use   Vaping status:  Never Used  Substance and Sexual Activity   Alcohol use: No   Drug use: No   Sexual activity: Yes  Other Topics Concern   Not on file  Social History Narrative   Not on file   Social Drivers of Health   Tobacco Use: Low Risk (10/19/2024)   Patient History    Smoking Tobacco Use: Never    Smokeless Tobacco Use: Never    Passive Exposure: Not on file  Financial Resource Strain: Low Risk (10/19/2024)   Overall Financial Resource Strain (CARDIA)    Difficulty of Paying Living Expenses: Not hard at all  Food Insecurity: No Food Insecurity (10/19/2024)   Epic    Worried About Radiation Protection Practitioner of Food in the Last Year: Never true    Ran Out of Food in the Last Year: Never true  Transportation Needs: No Transportation Needs (09/07/2024)   Epic    Lack of Transportation (Medical): No    Lack of Transportation (Non-Medical): No  Physical Activity: Inactive (09/07/2024)   Exercise Vital Sign    Days of Exercise per Week: 0 days     Minutes of Exercise per Session: 0 min  Stress: No Stress Concern Present (09/07/2024)   [employer] of Occupational Health - Occupational Stress Questionnaire    Feeling of Stress: Not at all  Social Connections: Socially Isolated (09/07/2024)   Social Connection and Isolation Panel    Frequency of Communication with Friends and Family: More than three times a week    Frequency of Social Gatherings with Friends and Family: More than three times a week    Attends Religious Services: Never    Database Administrator or Organizations: No    Attends Banker Meetings: Never    Marital Status: Divorced  Depression (PHQ2-9): Low Risk (09/07/2024)   Depression (PHQ2-9)    PHQ-2 Score: 3  Alcohol Screen: Low Risk (10/19/2024)   Alcohol Screen    Last Alcohol Screening Score (AUDIT): 0  Housing: Low Risk (10/19/2024)   Epic    Unable to Pay for Housing in the Last Year: No    Number of Times Moved in the Last Year: 0    Homeless in the Last Year: No  Utilities: Not At Risk (09/07/2024)   Epic    Threatened with loss of utilities: No  Health Literacy: Inadequate Health Literacy (09/07/2024)   B1300 Health Literacy    Frequency of need for help with medical instructions: Sometimes    Family History: The patient's family history includes Colon cancer in his mother; Diabetes in his brother; Hypertension in his daughter and mother. There is no history of Breast cancer, Pancreatic cancer, or Prostate cancer.  ROS:   Please see the history of present illness.     All other systems reviewed and are negative.  EKGs/Labs/Other Studies Reviewed:    EKG Interpretation Date/Time:  Wednesday October 19 2024 09:31:07 EST Ventricular Rate:  78 PR Interval:  164 QRS Duration:  94 QT Interval:  372 QTC Calculation: 424 R Axis:   67  Text Interpretation: Normal sinus rhythm Normal ECG Confirmed by Vannie Mora ([id number]) on 10/19/2024 9:34:41 AM    Cardiac Studies &  Procedures   ______________________________________________________________________________________________     ECHOCARDIOGRAM  ECHOCARDIOGRAM COMPLETE 08/14/2022  Narrative ECHOCARDIOGRAM REPORT    Patient Name:   FREDDRICK GLADSON Date of Exam: 08/14/2022 Medical Rec #:  [id number]       Height:       66.5 in Accession #:  [id number]      Weight:       157.2 lb Date of Birth:  1951/03/03       BSA:          1.815 m Patient Age:    71 years        BP:           190/90 mmHg Patient Gender: M               HR:           81 bpm. Exam Location:  Outpatient  Procedure: 2D Echo, 3D Echo, Cardiac Doppler, Color Doppler and Strain Analysis  Indications:    R01.1 Murmur  History:        Patient has no prior history of Echocardiogram examinations. Signs/Symptoms:Murmur; Risk Factors:Dyslipidemia, Hypertension, Diabetes and Non-Smoker. Prostate Cancer; Chronic Kidney Disease.  Sonographer:    Orvil Holmes RDCS Referring Phys: [id number] Zyrion Coey S Kamaljit Hizer  IMPRESSIONS   1. The aortic valve is tricuspid. There is moderate calcification of the aortic valve. There is mild thickening of the aortic valve. Aortic valve regurgitation is mild. Aortic valve sclerosis/calcification is present, without any evidence of aortic stenosis. Aortic valve mean gradient measures 9.0 mmHg. Aortic valve Vmax measures 2.00 m/s. 2. Left ventricular ejection fraction, by estimation, is 60 to 65%. The left ventricle has normal function. The left ventricle has no regional wall motion abnormalities. Left ventricular diastolic parameters are consistent with Grade I diastolic dysfunction (impaired relaxation). The average left ventricular global longitudinal strain is -17.1 %. The global longitudinal strain is normal. 3. Right ventricular systolic function is normal. The right ventricular size is normal. 4. Left atrial size was mildly dilated. 5. The mitral valve is normal in structure. Mild mitral valve  regurgitation. No evidence of mitral stenosis. Moderate mitral annular calcification. 6. The inferior vena cava is normal in size with greater than 50% respiratory variability, suggesting right atrial pressure of 3 mmHg.  FINDINGS Left Ventricle: Left ventricular ejection fraction, by estimation, is 60 to 65%. The left ventricle has normal function. The left ventricle has no regional wall motion abnormalities. The average left ventricular global longitudinal strain is -17.1 %. The global longitudinal strain is normal. The left ventricular internal cavity size was normal in size. There is no left ventricular hypertrophy. Left ventricular diastolic parameters are consistent with Grade I diastolic dysfunction (impaired relaxation).  Right Ventricle: The right ventricular size is normal. No increase in right ventricular wall thickness. Right ventricular systolic function is normal.  Left Atrium: Left atrial size was mildly dilated.  Right Atrium: Right atrial size was normal in size.  Pericardium: There is no evidence of pericardial effusion.  Mitral Valve: The mitral valve is normal in structure. Moderate mitral annular calcification. Mild mitral valve regurgitation. No evidence of mitral valve stenosis.  Tricuspid Valve: The tricuspid valve is normal in structure. Tricuspid valve regurgitation is not demonstrated. No evidence of tricuspid stenosis.  Aortic Valve: The aortic valve is tricuspid. There is moderate calcification of the aortic valve. There is mild thickening of the aortic valve. Aortic valve regurgitation is mild. Aortic regurgitation PHT measures 373 msec. Aortic valve sclerosis/calcification is present, without any evidence of aortic stenosis. Aortic valve mean gradient measures 9.0 mmHg. Aortic valve peak gradient measures 16.0 mmHg. Aortic valve area, by VTI measures 1.25 cm.  Pulmonic Valve: The pulmonic valve was normal in structure. Pulmonic valve regurgitation is not  visualized. No evidence of pulmonic stenosis.  Aorta: The aortic root  is normal in size and structure.  Venous: The inferior vena cava is normal in size with greater than 50% respiratory variability, suggesting right atrial pressure of 3 mmHg.  IAS/Shunts: No atrial level shunt detected by color flow Doppler.   LEFT VENTRICLE PLAX 2D LVIDd:         4.40 cm   Diastology LVIDs:         3.03 cm   LV e' medial:    7.51 cm/s LV PW:         1.35 cm   LV E/e' medial:  18.0 LV IVS:        1.16 cm   LV e' lateral:   6.85 cm/s LVOT diam:     1.80 cm   LV E/e' lateral: 19.7 LV SV:         53 LV SV Index:   29        2D Longitudinal Strain LVOT Area:     2.54 cm  2D Strain GLS Avg:     -17.1 %  3D Volume EF: 3D EF:        58 % LV EDV:       117 ml LV ESV:       49 ml LV SV:        68 ml  RIGHT VENTRICLE RV Basal diam:  3.21 cm RV Mid diam:    2.72 cm RV S prime:     14.80 cm/s TAPSE (M-mode): 3.5 cm  LEFT ATRIUM             Index        RIGHT ATRIUM           Index LA diam:        3.60 cm 1.98 cm/m   RA Area:     16.50 cm LA Vol (A2C):   47.3 ml 26.05 ml/m  RA Volume:   44.70 ml  24.62 ml/m LA Vol (A4C):   64.0 ml 35.25 ml/m LA Biplane Vol: 61.1 ml 33.66 ml/m AORTIC VALVE AV Area (Vmax):    1.24 cm AV Area (Vmean):   1.24 cm AV Area (VTI):     1.25 cm AV Vmax:           200.00 cm/s AV Vmean:          135.000 cm/s AV VTI:            0.423 m AV Peak Grad:      16.0 mmHg AV Mean Grad:      9.0 mmHg LVOT Vmax:         97.80 cm/s LVOT Vmean:        65.700 cm/s LVOT VTI:          0.207 m LVOT/AV VTI ratio: 0.49 AI PHT:            373 msec  AORTA Ao Root diam: 3.10 cm Ao Asc diam:  2.80 cm  MITRAL VALVE                TRICUSPID VALVE MV Area (PHT): 4.49 cm     TR Peak grad:   28.1 mmHg MV Decel Time: 169 msec     TR Vmax:        265.00 cm/s MR Peak grad: 113.6 mmHg MR Vmax:      533.00 cm/s   SHUNTS MV E velocity: 135.00 cm/s  Systemic VTI:  0.21 m MV A velocity:  123.00 cm/s  Systemic Diam: 1.80  cm MV E/A ratio:  1.10  Oneil Parchment MD Electronically signed by Oneil Parchment MD Signature Date/Time: 08/14/2022/2:31:26 PM    Final          ______________________________________________________________________________________________       Recent Labs: 03/23/2024: Hemoglobin 12.3; Platelets 200.0 09/07/2024: TSH 1.25 10/12/2024: ALT 10; BUN 46; Creatinine, Ser 2.80; Potassium 4.1; Sodium 142   Recent Lipid Panel    Component Value Date/Time   CHOL 151 09/07/2024 1104   TRIG 110.0 09/07/2024 1104   HDL 43.40 09/07/2024 1104   CHOLHDL 3 09/07/2024 1104   VLDL 22.0 09/07/2024 1104   LDLCALC 86 09/07/2024 1104   LDLCALC 70 09/07/2020 1038   LDLDIRECT 98 09/04/2022 1152    Physical Exam:   VS:  BP (!) 156/86 (BP Location: Left Arm)   Pulse 80   Ht 5' 6 (1.676 m)   Wt 157 lb 9.6 oz (71.5 kg)   SpO2 96%   BMI 25.44 kg/m  , BMI Body mass index is 25.44 kg/m.  Vitals:   10/19/24 0924 10/19/24 0927 10/19/24 0956  BP: (!) 149/80 (!) 156/86 128/78  Pulse: 80    Height: 5' 6 (1.676 m)    Weight: 157 lb 9.6 oz (71.5 kg)    SpO2: 96%    BMI (Calculated): 25.45      GENERAL:  Well appearing HEENT: Pupils equal round and reactive, fundi not visualized, oral mucosa unremarkable NECK:  No jugular venous distention, waveform within normal limits, carotid upstroke brisk and symmetric, no bruits, no thyromegaly LYMPHATICS:  No cervical adenopathy LUNGS:  Clear to auscultation bilaterally HEART:  RRR.  PMI not displaced or sustained,S1 and S2 within normal limits, no S3, no S4, no clicks, no rubs, Gr 3/6 murmurs ABD:  Flat, positive bowel sounds normal in frequency in pitch, no bruits, no rebound, no guarding, no midline pulsatile mass, no hepatomegaly, no splenomegaly EXT:  2 plus pulses throughout, no edema, no cyanosis no clubbing SKIN:  No rashes no nodules NEURO:  Cranial nerves II through XII grossly intact, motor grossly intact  throughout PSYCH:  Cognitively intact, oriented to person place and time   ASSESSMENT/PLAN:    White coat hypertension superimposed on HTN -  Diagnosed with hypertension ~5 years ago. Management complicated by renal function. 04/2021 renal artery duplex no stenosis. 03/2022 normal thyroid  function. Labs including renin-aldosterone, catecholamines/metanephrines, TSH were normal. He does have white coat hypertension which was demonstrated during enrollment in [facility] RPM study.  Initial BP in clinic 149/80 and 156/86 in bilateral arms.  Improved to 128/78 without intervention consistent with whitecoat hypertension. Reports home BP routinely 120-130s. Continue present regimen carvedilol  12.5 mg twice daily, spironolactone  25 mg daily (had difficulty splitting the pill), amlodipine  2.5 mg daily. Will route to nephrology to ensure they are okay with him being on 25 mg spironolactone .  If they wish to discontinue with increased dose of carvedilol .  Discussed to monitor BP at home at least 2 hours after medications and sitting for 5-10 minutes.  Renal function precludes renal denervation.  Snores / sleep disordered breathing - Notes some snoring though wakes feeling rested. Longstanding history of insomnia. Previously politely declined sleep study.   CKD - Careful titration of diuretic and antihypertensive  Established with nephrology Dr. Dolan.  Anemia - Continue to follow with PCP.   DM2 - Continue to follow with PCP. Appreciate inclusion of SGLT2i for cardioprotective benefit.   Cardiac murmur - noted on exam. Echo 08/14/22 LVEF 60-65%, moderate calcification of  aortic valve with mild AI and no stenosis, gr1DD, LA mildly dilated, mild MR, moderate mitral annular calcification.  Suspect murmur due to aortic valve sclerosis. Consider repeat echo in 07/2025 can coordinate at follow up. No heart failure symptoms today, no indication for urgent echo.  HLD - Continue Atorvastatin  80mg  per PCP.    Carotid artery stenosis - Carotid duplex 07/2020 with moderate left sided elevated plaque. Minimal amount of right sided plaque. 07/2022 and 01/2024 carotid duplex bilateral 1-39% stenosis. Continue Atorvastatin  80mg  daily.   Screening for Secondary Hypertension:     07/07/2022    8:10 PM  Causes  Drugs/Herbals Screened  Renovascular HTN Screened  Thyroid  Disease Screened  Hyperaldosteronism Screened  Pheochromocytoma Screened  Cushing's Syndrome Screened  Coarctation of the Aorta Screened    Relevant Labs/Studies:    Latest Ref Rng & Units 10/12/2024   10:57 AM 09/07/2024    9:23 AM 03/23/2024    9:28 AM  Basic Labs  Sodium 135 - 145 mEq/L 142  142  140   Potassium 3.5 - 5.1 mEq/L 4.1  4.7  4.4   Creatinine 0.40 - 1.50 mg/dL 7.19  7.57  7.74        Latest Ref Rng & Units 09/07/2024   11:04 AM 03/23/2024    9:28 AM  Thyroid    TSH 0.35 - 5.50 uIU/mL 1.25  2.04        Latest Ref Rng & Units 09/04/2022   11:52 AM  Renin/Aldosterone   Aldosterone 0.0 - 30.0 ng/dL <8.9   Aldos/Renin Ratio 0.0 - 30.0 <2.8        Latest Ref Rng & Units 09/04/2022   11:52 AM  Metanephrines/Catecholamines   Epinephrine 0 - 62 pg/mL 25   Norepinephrine 0 - 874 pg/mL 282   Dopamine 0 - 48 pg/mL <30   Metanephrines 0.0 - 88.0 pg/mL <25.0   Normetanephrines  0.0 - 285.2 pg/mL 31.9           Disposition:    FU in 6 mos  Medication Adjustments/Labs and Tests Ordered: Current medicines are reviewed at length with the patient today.  Concerns regarding medicines are outlined above.  Orders Placed This Encounter  Procedures   EKG 12-Lead   No orders of the defined types were placed in this encounter.    Signed, Reche GORMAN Finder, NP  07/03/2022 9:34 AM    [facility] "

## 2024-10-19 NOTE — Patient Instructions (Addendum)
 Medication Instructions:  Continue your current medications.   Try to get your Atorvastatin  in three times per week.    Testing/Procedures: Your EKG today looked great!   Follow-Up: Please follow up in 6 months in [facility] with Dr. Raford, Reche Finder, NP or Allean Mink PharmD    Special Instructions:    We will send a note to Dr. Dolan, your kidney doctor, about your Spironolactone  to ensure it is safe to continue.  If he thinks we need to stop the Spironolactone , we may increase your Carvedilol . We will wait until we hear from Dr. Dolan.   Please write down your blood pressure once per day and bring the log to your visit with the pharmacist at your primary care next week.

## 2024-10-24 ENCOUNTER — Ambulatory Visit (INDEPENDENT_AMBULATORY_CARE_PROVIDER_SITE_OTHER)

## 2024-10-24 VITALS — BP 138/76 | HR 65

## 2024-10-24 DIAGNOSIS — I10 Essential (primary) hypertension: Secondary | ICD-10-CM

## 2024-10-24 DIAGNOSIS — E1142 Type 2 diabetes mellitus with diabetic polyneuropathy: Secondary | ICD-10-CM

## 2024-10-24 NOTE — Progress Notes (Signed)
 "   Name: [name] MRN: [id number] DOB: 1951/08/01  Chief Complaint  Patient presents with   Medication Management   Hypertension    [name] is a 73 y.o. year old male who was referred for medication management by their primary care provider, [name], MD. They presented for a face to face visit today.   They were referred to the pharmacist by their PCP for assistance in managing diabetes, hypertension, and complex medication management    Subjective:  Care Team: Primary Care Provider: [name], MD  Medication Access/Adherence  [phone] [place], [place] - [address] [place] [address] Phone: [phone] Fax: [phone]  [facility] (New Address) - [place], [place] - [address] AT Previously: Viviana Mull[address], [place] [address] [facility] [address] 4th Floor [address] [place] [address] Phone: [phone] Fax: [phone]  [facility] - [place]  - [place] - [address] [place] [address] Phone: [phone] Fax: [phone]   Patient reports affordability concerns with their medications: No  Patient reports access/transportation concerns to their pharmacy: No  Patient reports adherence concerns with their medications:  YES - STOPS MEDS ON HIS OWN DUE TO NOT FEELING GOOD when taking them all  Hypertension:  Current medications:  Carvedilol  25mg  BID (1/2 tab BID), Amlodipine  2.5mg , Spironolactone  25mg  daily Medications previously tried: Diltiazem , Metoprolol , Losartan , Lisinopril , Triamterene /hydrochlorothiazide , Amlodipine   Patient has a validated, automated, upper arm home BP cuff Current blood pressure readings: has not been checking at home due to holiday travels, will resume today   Elevated at 154/88 in office at first check but comes down to 138/76 after  rest  Denies any signs or symptoms of high BP  Diabetes:  Current medications: Tresiba 30 units daily, Novolog  6 units BID with food, Farxiga  10mg  Medications tried in the past: None   Current glucose readings:    Observed patterns:  Lows after meals  Patient denies hypoglycemic s/sx including dizziness, shakiness, sweating. Patient denies hyperglycemic symptoms including polyuria, polydipsia, polyphagia, nocturia, neuropathy, blurred vision.  Hyperlipidemia/ASCVD Risk Reduction  Current lipid lowering medications: Lipitor 80mg  Medications tried in the past: None  Has dropped down to three times per week due to leg cramps, reports this has resolved the issue  Insomnia -Taking trazodone  50mg  prn sleep, reports this is not working well  Objective:  Lab Results  Component Value Date   HGBA1C 8.4 (H) 09/07/2024    Lab Results  Component Value Date   CREATININE 2.80 (H) 10/12/2024   BUN 46 (H) 10/12/2024   NA 142 10/12/2024   K 4.1 10/12/2024   CL 107 10/12/2024   CO2 28 10/12/2024    Lab Results  Component Value Date   CHOL 151 09/07/2024   HDL 43.40 09/07/2024   LDLCALC 86 09/07/2024   LDLDIRECT 98 09/04/2022   TRIG 110.0 09/07/2024   CHOLHDL 3 09/07/2024    Medications Reviewed Today     Reviewed by Lionell Jon DEL, RPH (Pharmacist) on 10/24/24 at 1440  Med List Status: <None>   Medication Order Taking? Sig Documenting Provider Last Dose Status Informant  amLODipine  (NORVASC ) 2.5 MG tablet [id number] Yes Take 1 tablet (2.5 mg total) by mouth daily. [name], MD  Active   aspirin  EC (ASPIRIN  LOW DOSE) 81 MG tablet [id number] Yes Take 1 tablet (81 mg total) by mouth daily. SWALLOW WHOLE. Walker, Caitlin  S, NP  Active   atorvastatin  (LIPITOR) 80 MG tablet [id number] Yes TAKE 1 TABLET BY MOUTH EVERY DAY  Patient taking differently: Take 80 mg by mouth daily. Three times per week   [name], MD  Active   BD PEN NEEDLE NANO 2ND GEN 32G X 4 MM  MISC [id number]  TO USE WITH INSULIN  PEN TWICE A DAY. [name], MD  Active   blood glucose meter kit and supplies [id number]  Dispense based on patient and insurance preference. Use to check glucose daily (FOR ICD-10 E10.9, E11.9). Alvia Bring, DO  Active   Blood Glucose Monitoring Suppl (ONE TOUCH ULTRA 2) w/Device KIT [id number]  Use to check blood sugars twice daily as directed. Dx E11.9 [name], MD  Active   carvedilol  (COREG ) 25 MG tablet [id number] Yes Take 0.5 tablets (12.5 mg total) by mouth 2 (two) times daily. Vannie Reche RAMAN, NP  Active   cholecalciferol (VITAMIN D3) 25 MCG (1000 UNIT) tablet [id number] Yes Take 1,000 Units by mouth daily. [provider]  Active   Continuous Glucose Sensor (DEXCOM G7 SENSOR) [place] [id number]  Use to monitor blood sugar continuously. Change sensor every 10 days as directed Dx E11.65 [name], MD  Active   dapagliflozin  propanediol (FARXIGA ) 10 MG TABS tablet [id number] Yes Take 10 mg by mouth daily. [provider]  Active   Ferrous Sulfate Dried (FERROUS SULFATE CR PO) [id number]  Take 325 mg by mouth daily at 2 PM. [provider]  Active   glucose blood (ONETOUCH ULTRA TEST) test strip [id number]  Use to check blood sugar twice daily as directed E11.9 [name], MD  Active   insulin  aspart (NOVOLOG  FLEXPEN) 100 UNIT/ML FlexPen [id number] Yes Inject 9 Units into the skin 2 (two) times daily with a meal. Through NOVO PAP  Patient taking differently: Inject 4 Units into the skin 2 (two) times daily with a meal. Through NOVO PAP   [provider]  Active   insulin  degludec (TRESIBA FLEXTOUCH) 100 UNIT/ML FlexTouch Pen [id number]  Inject 42 Units into the skin daily. Getting through NOVO PAP  Patient taking differently: Inject 38 Units into the skin daily. Getting through NOVO PAP   [provider]  Active   Lancet Devices (ONE TOUCH DELICA LANCING DEV) MISC [id number]  1 Device by Does not  apply route as directed. Shamleffer, Donell Cardinal, MD  Active   Lancets [facility] DELICA PLUS [place]) MISC [id number]  Use 1 each to test sugars twice daily as directed E11.9 [name], MD  Active   latanoprost (XALATAN) 0.005 % ophthalmic solution [id number]    Patient not taking: Reported on 10/19/2024   [provider]  Active   spironolactone  (ALDACTONE ) 25 MG tablet [id number]  Take 1 tablet (25 mg total) by mouth daily. Vannie Reche RAMAN, NP  Active   traZODone  (DESYREL ) 50 MG tablet [id number]  TAKE 1 TABLET BY MOUTH EVERYDAY AT BEDTIME  Patient taking differently: Take 50 mg by mouth at bedtime as needed for sleep.   [name], MD  Active               Assessment/Plan:    Hypertension: - Currently controlled - Reviewed long term cardiovascular and renal outcomes of uncontrolled blood pressure - Reviewed appropriate blood pressure monitoring technique and reviewed goal blood pressure. Recommended to check home blood pressure and heart rate daily -check twice daily and bring readings to appt -Has appt with  nephro in Jan -Continue current med therapy  Diabetes: - Currently uncontrolled but improving - Reviewed long term cardiovascular and renal outcomes of uncontrolled blood sugar - Reviewed goal A1c, goal fasting, and goal 2 hour post prandial glucose - Reviewed dietary modifications including low carb diet - Patient denies personal or family history of multiple endocrine neoplasia type 2, medullary thyroid  cancer; personal history of pancreatitis or gallbladder disease. - Recommend to check glucose once daily -Continue tresiba to 38 units once daily and farxiga  10mg  daily. DECREASE novolog  to 4 units BID, farxiga  10mg  daily.  -Counseled on importance of routine with meals and not going extended periods of time without eating in diabetic patients to avoid low BG  Hyperlipidemia/ASCVD Risk Reduction: - Currently uncontrolled.  - Reviewed long  term complications of uncontrolled cholesterol - Recommend to obtain updated lipid panel at next PCP visit, consider addition of zetia of LDL not controlled   Insomnia -Take trazodone  nightly as prescribed  Follow Up Plan: 2 weeks  Jon VEAR Lindau, PharmD Clinical Pharmacist [phone]    "

## 2024-11-03 ENCOUNTER — Institutional Professional Consult (permissible substitution) (HOSPITAL_BASED_OUTPATIENT_CLINIC_OR_DEPARTMENT_OTHER): Admitting: Family

## 2024-11-09 ENCOUNTER — Ambulatory Visit

## 2024-11-09 DIAGNOSIS — E1142 Type 2 diabetes mellitus with diabetic polyneuropathy: Secondary | ICD-10-CM

## 2024-11-09 DIAGNOSIS — I10 Essential (primary) hypertension: Secondary | ICD-10-CM

## 2024-11-09 DIAGNOSIS — E782 Mixed hyperlipidemia: Secondary | ICD-10-CM

## 2024-11-09 NOTE — Progress Notes (Signed)
 "   Name: [name] MRN: [id number] DOB: [birth year]  Chief Complaint  Patient presents with   Medication Management   Diabetes   Hypertension   Hyperlipidemia    [name] is a 74 y.o. year old male who was referred for medication management by their primary care provider, [name] Clotilda SAUNDERS, MD. They presented for a face to face visit today.   They were referred to the pharmacist by their PCP for assistance in managing diabetes, hypertension, and complex medication management    Subjective:  Care Team: Primary Care Provider: [name], MD  Medication Access/Adherence  [phone] [name], [place] - [address] [name] [address] Phone: [phone] Fax: [phone]  [facility] (New Address) - [place], [place] - [address] [facility] AT Previously: [name], [place] [address] [facility] [address] 4[address] [place] [address] Phone: [phone] Fax: [phone]  [facility] - [place]  - [place], [place] - [address] [place] [address] Phone: [phone] Fax: [phone]   Patient reports affordability concerns with their medications: No  Patient reports access/transportation concerns to their pharmacy: No  Patient reports adherence concerns with their medications:  YES - STOPS MEDS ON HIS OWN DUE TO NOT FEELING GOOD when taking them all  Hypertension:  Current medications:  Carvedilol  25mg  BID (1/2 tab BID), Amlodipine  2.5mg , Spironolactone  25mg  daily Medications previously tried: Diltiazem , Metoprolol , Losartan , Lisinopril , Triamterene /hydrochlorothiazide , Amlodipine   Patient has a validated, automated, upper arm home BP cuff Current blood pressure readings: Did not bring machine, reports he was unable to check today due to dead battery in machine. Reports seeing SBP readings in 110 at  home, cannot recall bottom number   Elevated at 196/112 in office, does not come down upon recheck  Denies any signs or symptoms of high BP. Recommend patient to go to ER, declines and reports he feels fine. Still declines after discusses risks of BP that high  Diabetes:  Current medications: Tresiba 38 units daily, Novolog  6 units BID with food, Farxiga  10mg  Medications tried in the past: None   Current glucose readings:    Observed patterns:  Lows over night, high BG after dinner in evenings  Patient denies hypoglycemic s/sx including dizziness, shakiness, sweating. Patient denies hyperglycemic symptoms including polyuria, polydipsia, polyphagia, nocturia, neuropathy, blurred vision.  Hyperlipidemia/ASCVD Risk Reduction  Current lipid lowering medications: Lipitor 80mg  Medications tried in the past: None  Has dropped down to three times per week due to leg cramps, reports this has resolved the issue  Insomnia -Taking trazodone  50mg  prn sleep, reports this is not working well  Objective:  Lab Results  Component Value Date   HGBA1C 8.4 (H) 09/07/2024    Lab Results  Component Value Date   CREATININE 2.80 (H) 10/12/2024   BUN 46 (H) 10/12/2024   NA 142 10/12/2024   K 4.1 10/12/2024   CL 107 10/12/2024   CO2 28 10/12/2024    Lab Results  Component Value Date   CHOL 151 09/07/2024   HDL 43.40 09/07/2024   LDLCALC 86 09/07/2024   LDLDIRECT 98 09/04/2022   TRIG 110.0 09/07/2024   CHOLHDL 3 09/07/2024    Medications Reviewed Today     Reviewed by [name], RPH (Pharmacist) on 11/09/24 at 1147  Med List Status: <None>   Medication Order Taking? Sig Documenting Provider Last Dose Status Informant  amLODipine  (NORVASC )  2.5 MG tablet [id number]  Take 1 tablet (2.5 mg total) by mouth daily. [name] Clotilda SAUNDERS, MD  Active   aspirin  EC (ASPIRIN  LOW DOSE) 81 MG tablet [id number]  Take 1 tablet (81 mg total) by mouth daily. SWALLOW WHOLE. [name] Reche RAMAN, NP   Active   atorvastatin  (LIPITOR) 80 MG tablet [id number]  TAKE 1 TABLET BY MOUTH EVERY DAY  Patient taking differently: Take 80 mg by mouth daily. Three times per week   [name] Clotilda SAUNDERS, MD  Active   BD PEN NEEDLE NANO 2ND GEN 32G X 4 MM MISC [id number]  TO USE WITH INSULIN  PEN TWICE A DAY. [name] Clotilda SAUNDERS, MD  Active   blood glucose meter kit and supplies [id number]  Dispense based on patient and insurance preference. Use to check glucose daily (FOR ICD-10 E10.9, E11.9). Alvia Bring, DO  Active   Blood Glucose Monitoring Suppl (ONE TOUCH ULTRA 2) w/Device KIT [id number]  Use to check blood sugars twice daily as directed. Dx E11.9 [name] Clotilda SAUNDERS, MD  Active   carvedilol  (COREG ) 25 MG tablet [id number]  Take 0.5 tablets (12.5 mg total) by mouth 2 (two) times daily. [name] Reche RAMAN, NP  Active   cholecalciferol (VITAMIN D3) 25 MCG (1000 UNIT) tablet [id number]  Take 1,000 Units by mouth daily. [provider]  Active   Continuous Glucose Sensor (DEXCOM G7 SENSOR) [place] [id number]  Use to monitor blood sugar continuously. Change sensor every 10 days as directed Dx E11.65 [name] Clotilda SAUNDERS, MD  Active   dapagliflozin  propanediol (FARXIGA ) 10 MG TABS tablet [id number]  Take 10 mg by mouth daily. [provider]  Active   Ferrous Sulfate Dried (FERROUS SULFATE CR PO) [id number]  Take 325 mg by mouth daily at 2 PM. [provider]  Active   glucose blood (ONETOUCH ULTRA TEST) test strip [id number]  Use to check blood sugar twice daily as directed E11.9 [name] Clotilda SAUNDERS, MD  Active   insulin  aspart (NOVOLOG  FLEXPEN) 100 UNIT/ML FlexPen [id number]  Inject 9 Units into the skin 2 (two) times daily with a meal. Through NOVO PAP  Patient taking differently: Inject 4 Units into the skin 2 (two) times daily with a meal. Through NOVO PAP   [provider]  Active   insulin  degludec (TRESIBA FLEXTOUCH) 100 UNIT/ML FlexTouch Pen [id number]  Inject 42 Units into the skin daily. Getting  through NOVO PAP  Patient taking differently: Inject 38 Units into the skin daily. Getting through NOVO PAP   [provider]  Active   Lancet Devices (ONE TOUCH DELICA LANCING DEV) MISC [id number]  1 Device by Does not apply route as directed. Shamleffer, Donell Cardinal, MD  Active   Lancets [facility] DELICA PLUS [place]) MISC [id number]  Use 1 each to test sugars twice daily as directed E11.9 [name] Clotilda SAUNDERS, MD  Active   latanoprost (XALATAN) 0.005 % ophthalmic solution [id number]    Patient not taking: Reported on 10/19/2024   [provider]  Active   spironolactone  (ALDACTONE ) 25 MG tablet [id number]  Take 1 tablet (25 mg total) by mouth daily. [name] Reche RAMAN, NP  Active   traZODone  (DESYREL ) 50 MG tablet [id number]  TAKE 1 TABLET BY MOUTH EVERYDAY AT BEDTIME  Patient taking differently: Take 50 mg by mouth at bedtime as needed for sleep.   [name] Clotilda SAUNDERS, MD  Active               Assessment/Plan:    Hypertension: - Currently controlled -  Reviewed long term cardiovascular and renal outcomes of uncontrolled blood pressure - Reviewed appropriate blood pressure monitoring technique and reviewed goal blood pressure. Recommended to check home blood pressure and heart rate daily -check twice daily and bring readings to appt -Has appt with nephro in Jan -Continue current med therapy  Diabetes: - Currently uncontrolled but improving - Reviewed long term cardiovascular and renal outcomes of uncontrolled blood sugar - Reviewed goal A1c, goal fasting, and goal 2 hour post prandial glucose - Reviewed dietary modifications including low carb diet - Patient denies personal or family history of multiple endocrine neoplasia type 2, medullary thyroid  cancer; personal history of pancreatitis or gallbladder disease. - Recommend to check glucose once daily -Decrease tresiba to 36 units once daily and continue farxiga  10mg  daily. Continue novolog  6 units with first meal  of day, INCREASE novolog  to 8 units at dinner -Counseled on importance of routine with meals and not going extended periods of time without eating in diabetic patients to avoid low BG  Hyperlipidemia/ASCVD Risk Reduction: - Currently uncontrolled.  - Reviewed long term complications of uncontrolled cholesterol - Recommend to obtain updated lipid panel at next PCP visit, consider addition of zetia of LDL not controlled   Insomnia -Take trazodone  nightly as prescribed  Follow Up Plan: 4 weeks (sees PCP in 2 weeks)  Jon VEAR Lindau, PharmD Clinical Pharmacist [phone]    "

## 2024-11-23 ENCOUNTER — Ambulatory Visit: Admitting: Family Medicine

## 2024-11-23 DIAGNOSIS — N184 Chronic kidney disease, stage 4 (severe): Secondary | ICD-10-CM

## 2024-11-23 DIAGNOSIS — E782 Mixed hyperlipidemia: Secondary | ICD-10-CM

## 2024-11-23 DIAGNOSIS — E1165 Type 2 diabetes mellitus with hyperglycemia: Secondary | ICD-10-CM

## 2024-11-23 DIAGNOSIS — I10 Essential (primary) hypertension: Secondary | ICD-10-CM

## 2024-12-07 ENCOUNTER — Ambulatory Visit
